# Patient Record
Sex: Female | Born: 1950
Health system: Southern US, Community
[De-identification: ages and names within clinical notes are randomized; demographics above are authoritative.]

## PROBLEM LIST (undated history)

## (undated) DIAGNOSIS — I82409 Acute embolism and thrombosis of unspecified deep veins of unspecified lower extremity: Secondary | ICD-10-CM

## (undated) DIAGNOSIS — A0811 Acute gastroenteropathy due to Norwalk agent: Secondary | ICD-10-CM

## (undated) DIAGNOSIS — G4730 Sleep apnea, unspecified: Secondary | ICD-10-CM

## (undated) DIAGNOSIS — R0609 Other forms of dyspnea: Secondary | ICD-10-CM

## (undated) DIAGNOSIS — F32A Depression, unspecified: Secondary | ICD-10-CM

## (undated) DIAGNOSIS — F329 Major depressive disorder, single episode, unspecified: Secondary | ICD-10-CM

## (undated) DIAGNOSIS — K5730 Diverticulosis of large intestine without perforation or abscess without bleeding: Secondary | ICD-10-CM

## (undated) DIAGNOSIS — R001 Bradycardia, unspecified: Secondary | ICD-10-CM

## (undated) DIAGNOSIS — R0600 Dyspnea, unspecified: Secondary | ICD-10-CM

## (undated) DIAGNOSIS — K222 Esophageal obstruction: Secondary | ICD-10-CM

## (undated) DIAGNOSIS — K589 Irritable bowel syndrome without diarrhea: Secondary | ICD-10-CM

## (undated) DIAGNOSIS — R079 Chest pain, unspecified: Secondary | ICD-10-CM

## (undated) DIAGNOSIS — I10 Essential (primary) hypertension: Secondary | ICD-10-CM

## (undated) DIAGNOSIS — E785 Hyperlipidemia, unspecified: Secondary | ICD-10-CM

## (undated) DIAGNOSIS — K5792 Diverticulitis of intestine, part unspecified, without perforation or abscess without bleeding: Secondary | ICD-10-CM

## (undated) HISTORY — DX: Hyperlipidemia, unspecified: E78.5

## (undated) HISTORY — DX: Acute embolism and thrombosis of unspecified deep veins of unspecified lower extremity: I82.409

## (undated) HISTORY — DX: Diverticulitis of intestine, part unspecified, without perforation or abscess without bleeding: K57.92

## (undated) HISTORY — PX: NISSEN FUNDOPLICATION: SHX2091

## (undated) HISTORY — DX: Major depressive disorder, single episode, unspecified: F32.9

## (undated) HISTORY — PX: VAGINAL HYSTERECTOMY: SUR661

## (undated) HISTORY — PX: APPENDECTOMY: SHX54

## (undated) HISTORY — DX: Sleep apnea, unspecified: G47.30

## (undated) HISTORY — DX: Diverticulosis of large intestine without perforation or abscess without bleeding: K57.30

## (undated) HISTORY — DX: Depression, unspecified: F32.A

## (undated) HISTORY — DX: Irritable bowel syndrome, unspecified: K58.9

## (undated) HISTORY — PX: CHOLECYSTECTOMY: SHX55

## (undated) HISTORY — PX: COLON SURGERY: SHX602

## (undated) HISTORY — DX: Esophageal obstruction: K22.2

---

## 1997-08-27 ENCOUNTER — Other Ambulatory Visit: Admission: RE | Admit: 1997-08-27 | Discharge: 1997-08-27 | Payer: Self-pay | Admitting: Obstetrics and Gynecology

## 1997-12-26 ENCOUNTER — Encounter: Payer: Self-pay | Admitting: Emergency Medicine

## 1997-12-26 ENCOUNTER — Emergency Department (HOSPITAL_COMMUNITY): Admission: EM | Admit: 1997-12-26 | Discharge: 1997-12-26 | Payer: Self-pay | Admitting: Emergency Medicine

## 1998-07-18 ENCOUNTER — Ambulatory Visit (HOSPITAL_COMMUNITY): Admission: RE | Admit: 1998-07-18 | Discharge: 1998-07-18 | Payer: Self-pay | Admitting: Gastroenterology

## 1998-07-18 ENCOUNTER — Encounter: Payer: Self-pay | Admitting: Gastroenterology

## 1998-11-28 ENCOUNTER — Inpatient Hospital Stay (HOSPITAL_COMMUNITY): Admission: EM | Admit: 1998-11-28 | Discharge: 1998-11-29 | Payer: Self-pay | Admitting: Emergency Medicine

## 1998-11-28 ENCOUNTER — Encounter: Payer: Self-pay | Admitting: Emergency Medicine

## 1998-12-31 ENCOUNTER — Ambulatory Visit: Admission: RE | Admit: 1998-12-31 | Discharge: 1998-12-31 | Payer: Self-pay | Admitting: Internal Medicine

## 1999-03-21 ENCOUNTER — Other Ambulatory Visit: Admission: RE | Admit: 1999-03-21 | Discharge: 1999-03-21 | Payer: Self-pay | Admitting: Obstetrics and Gynecology

## 1999-06-14 ENCOUNTER — Ambulatory Visit: Admission: RE | Admit: 1999-06-14 | Discharge: 1999-06-14 | Payer: Self-pay | Admitting: Pulmonary Disease

## 2000-01-09 ENCOUNTER — Encounter: Payer: Self-pay | Admitting: Obstetrics and Gynecology

## 2000-01-09 ENCOUNTER — Encounter: Admission: RE | Admit: 2000-01-09 | Discharge: 2000-01-09 | Payer: Self-pay | Admitting: Obstetrics and Gynecology

## 2000-06-30 ENCOUNTER — Encounter: Admission: RE | Admit: 2000-06-30 | Discharge: 2000-06-30 | Payer: Self-pay | Admitting: Otolaryngology

## 2000-06-30 ENCOUNTER — Encounter: Payer: Self-pay | Admitting: Otolaryngology

## 2000-08-10 ENCOUNTER — Encounter (INDEPENDENT_AMBULATORY_CARE_PROVIDER_SITE_OTHER): Payer: Self-pay | Admitting: *Deleted

## 2000-08-10 ENCOUNTER — Ambulatory Visit (HOSPITAL_COMMUNITY): Admission: RE | Admit: 2000-08-10 | Discharge: 2000-08-10 | Payer: Self-pay | Admitting: *Deleted

## 2000-08-31 ENCOUNTER — Encounter (INDEPENDENT_AMBULATORY_CARE_PROVIDER_SITE_OTHER): Payer: Self-pay | Admitting: *Deleted

## 2000-08-31 ENCOUNTER — Ambulatory Visit (HOSPITAL_COMMUNITY): Admission: RE | Admit: 2000-08-31 | Discharge: 2000-08-31 | Payer: Self-pay | Admitting: *Deleted

## 2000-11-12 ENCOUNTER — Encounter: Payer: Self-pay | Admitting: General Surgery

## 2000-11-15 ENCOUNTER — Observation Stay (HOSPITAL_COMMUNITY): Admission: RE | Admit: 2000-11-15 | Discharge: 2000-11-16 | Payer: Self-pay | Admitting: General Surgery

## 2000-11-15 ENCOUNTER — Encounter (INDEPENDENT_AMBULATORY_CARE_PROVIDER_SITE_OTHER): Payer: Self-pay | Admitting: *Deleted

## 2001-05-11 HISTORY — PX: GALLBLADDER SURGERY: SHX652

## 2001-06-24 ENCOUNTER — Other Ambulatory Visit: Admission: RE | Admit: 2001-06-24 | Discharge: 2001-06-24 | Payer: Self-pay | Admitting: *Deleted

## 2001-10-20 ENCOUNTER — Encounter: Admission: RE | Admit: 2001-10-20 | Discharge: 2001-10-20 | Payer: Self-pay

## 2001-11-14 ENCOUNTER — Encounter: Payer: Self-pay | Admitting: Surgery

## 2001-11-21 ENCOUNTER — Encounter (INDEPENDENT_AMBULATORY_CARE_PROVIDER_SITE_OTHER): Payer: Self-pay | Admitting: Specialist

## 2001-11-21 ENCOUNTER — Encounter: Payer: Self-pay | Admitting: Surgery

## 2001-11-21 ENCOUNTER — Observation Stay (HOSPITAL_COMMUNITY): Admission: RE | Admit: 2001-11-21 | Discharge: 2001-11-22 | Payer: Self-pay | Admitting: Surgery

## 2001-11-21 ENCOUNTER — Encounter (INDEPENDENT_AMBULATORY_CARE_PROVIDER_SITE_OTHER): Payer: Self-pay | Admitting: *Deleted

## 2002-05-11 HISTORY — PX: OTHER SURGICAL HISTORY: SHX169

## 2002-07-21 ENCOUNTER — Other Ambulatory Visit: Admission: RE | Admit: 2002-07-21 | Discharge: 2002-07-21 | Payer: Self-pay | Admitting: Obstetrics & Gynecology

## 2002-08-24 ENCOUNTER — Encounter: Payer: Self-pay | Admitting: Cardiology

## 2002-08-24 ENCOUNTER — Ambulatory Visit (HOSPITAL_COMMUNITY): Admission: RE | Admit: 2002-08-24 | Discharge: 2002-08-24 | Payer: Self-pay | Admitting: Cardiology

## 2002-09-08 ENCOUNTER — Encounter: Payer: Self-pay | Admitting: Emergency Medicine

## 2002-09-08 ENCOUNTER — Emergency Department (HOSPITAL_COMMUNITY): Admission: EM | Admit: 2002-09-08 | Discharge: 2002-09-08 | Payer: Self-pay | Admitting: Emergency Medicine

## 2002-09-09 ENCOUNTER — Inpatient Hospital Stay (HOSPITAL_COMMUNITY): Admission: EM | Admit: 2002-09-09 | Discharge: 2002-09-13 | Payer: Self-pay | Admitting: Emergency Medicine

## 2002-09-15 ENCOUNTER — Encounter: Admission: RE | Admit: 2002-09-15 | Discharge: 2002-09-15 | Payer: Self-pay | Admitting: Internal Medicine

## 2002-09-18 ENCOUNTER — Encounter: Admission: RE | Admit: 2002-09-18 | Discharge: 2002-09-18 | Payer: Self-pay | Admitting: Internal Medicine

## 2002-09-22 ENCOUNTER — Encounter: Admission: RE | Admit: 2002-09-22 | Discharge: 2002-09-22 | Payer: Self-pay | Admitting: Internal Medicine

## 2002-09-25 ENCOUNTER — Encounter: Admission: RE | Admit: 2002-09-25 | Discharge: 2002-09-25 | Payer: Self-pay | Admitting: Internal Medicine

## 2002-10-09 ENCOUNTER — Encounter: Admission: RE | Admit: 2002-10-09 | Discharge: 2002-10-09 | Payer: Self-pay | Admitting: Internal Medicine

## 2002-11-06 ENCOUNTER — Encounter: Admission: RE | Admit: 2002-11-06 | Discharge: 2002-11-06 | Payer: Self-pay | Admitting: Internal Medicine

## 2002-11-30 ENCOUNTER — Encounter: Admission: RE | Admit: 2002-11-30 | Discharge: 2002-11-30 | Payer: Self-pay | Admitting: Internal Medicine

## 2002-12-21 ENCOUNTER — Encounter: Admission: RE | Admit: 2002-12-21 | Discharge: 2002-12-21 | Payer: Self-pay | Admitting: Unknown Physician Specialty

## 2003-01-01 ENCOUNTER — Encounter: Admission: RE | Admit: 2003-01-01 | Discharge: 2003-01-01 | Payer: Self-pay | Admitting: Internal Medicine

## 2003-02-05 ENCOUNTER — Encounter: Admission: RE | Admit: 2003-02-05 | Discharge: 2003-02-05 | Payer: Self-pay | Admitting: Internal Medicine

## 2003-06-26 ENCOUNTER — Encounter: Admission: RE | Admit: 2003-06-26 | Discharge: 2003-06-26 | Payer: Self-pay | Admitting: Family Medicine

## 2003-10-03 ENCOUNTER — Encounter: Admission: RE | Admit: 2003-10-03 | Discharge: 2003-10-03 | Payer: Self-pay | Admitting: Family Medicine

## 2003-10-05 ENCOUNTER — Encounter: Admission: RE | Admit: 2003-10-05 | Discharge: 2003-10-05 | Payer: Self-pay | Admitting: Family Medicine

## 2003-11-14 ENCOUNTER — Encounter: Admission: RE | Admit: 2003-11-14 | Discharge: 2003-11-14 | Payer: Self-pay | Admitting: Neurosurgery

## 2003-11-28 ENCOUNTER — Encounter: Admission: RE | Admit: 2003-11-28 | Discharge: 2003-11-28 | Payer: Self-pay | Admitting: Neurosurgery

## 2003-12-13 ENCOUNTER — Encounter: Admission: RE | Admit: 2003-12-13 | Discharge: 2003-12-13 | Payer: Self-pay | Admitting: Neurosurgery

## 2004-01-24 ENCOUNTER — Encounter: Admission: RE | Admit: 2004-01-24 | Discharge: 2004-01-24 | Payer: Self-pay | Admitting: Cardiology

## 2004-02-15 ENCOUNTER — Encounter: Admission: RE | Admit: 2004-02-15 | Discharge: 2004-02-15 | Payer: Self-pay | Admitting: Gastroenterology

## 2004-02-29 ENCOUNTER — Encounter: Payer: Self-pay | Admitting: Gastroenterology

## 2004-02-29 ENCOUNTER — Ambulatory Visit (HOSPITAL_COMMUNITY): Admission: RE | Admit: 2004-02-29 | Discharge: 2004-02-29 | Payer: Self-pay | Admitting: Gastroenterology

## 2004-02-29 DIAGNOSIS — K5730 Diverticulosis of large intestine without perforation or abscess without bleeding: Secondary | ICD-10-CM | POA: Insufficient documentation

## 2004-02-29 DIAGNOSIS — K222 Esophageal obstruction: Secondary | ICD-10-CM | POA: Insufficient documentation

## 2004-06-06 ENCOUNTER — Encounter: Admission: RE | Admit: 2004-06-06 | Discharge: 2004-06-06 | Payer: Self-pay | Admitting: Family Medicine

## 2005-05-11 HISTORY — PX: PLANTAR FASCIA SURGERY: SHX746

## 2006-05-07 ENCOUNTER — Emergency Department: Payer: Self-pay | Admitting: Emergency Medicine

## 2007-01-24 ENCOUNTER — Emergency Department: Payer: Self-pay | Admitting: Emergency Medicine

## 2008-07-10 ENCOUNTER — Encounter: Admission: RE | Admit: 2008-07-10 | Discharge: 2008-07-10 | Payer: Self-pay | Admitting: Cardiology

## 2008-07-17 ENCOUNTER — Ambulatory Visit (HOSPITAL_COMMUNITY): Admission: RE | Admit: 2008-07-17 | Discharge: 2008-07-17 | Payer: Self-pay | Admitting: Cardiology

## 2008-07-17 ENCOUNTER — Encounter (INDEPENDENT_AMBULATORY_CARE_PROVIDER_SITE_OTHER): Payer: Self-pay | Admitting: Cardiology

## 2008-11-18 ENCOUNTER — Emergency Department: Payer: Self-pay | Admitting: Emergency Medicine

## 2009-04-27 ENCOUNTER — Emergency Department: Payer: Self-pay | Admitting: Internal Medicine

## 2009-06-12 ENCOUNTER — Encounter: Admission: RE | Admit: 2009-06-12 | Discharge: 2009-06-12 | Payer: Self-pay | Admitting: Family Medicine

## 2010-01-20 ENCOUNTER — Telehealth: Payer: Self-pay | Admitting: Gastroenterology

## 2010-01-21 ENCOUNTER — Encounter: Payer: Self-pay | Admitting: Gastroenterology

## 2010-01-21 DIAGNOSIS — I82409 Acute embolism and thrombosis of unspecified deep veins of unspecified lower extremity: Secondary | ICD-10-CM | POA: Insufficient documentation

## 2010-01-21 DIAGNOSIS — F329 Major depressive disorder, single episode, unspecified: Secondary | ICD-10-CM | POA: Insufficient documentation

## 2010-01-21 DIAGNOSIS — I10 Essential (primary) hypertension: Secondary | ICD-10-CM | POA: Insufficient documentation

## 2010-01-22 ENCOUNTER — Encounter: Payer: Self-pay | Admitting: Nurse Practitioner

## 2010-01-22 ENCOUNTER — Ambulatory Visit: Payer: Self-pay | Admitting: Gastroenterology

## 2010-01-22 DIAGNOSIS — K5900 Constipation, unspecified: Secondary | ICD-10-CM | POA: Insufficient documentation

## 2010-01-22 DIAGNOSIS — K589 Irritable bowel syndrome without diarrhea: Secondary | ICD-10-CM | POA: Insufficient documentation

## 2010-01-23 ENCOUNTER — Telehealth (INDEPENDENT_AMBULATORY_CARE_PROVIDER_SITE_OTHER): Payer: Self-pay | Admitting: *Deleted

## 2010-01-27 ENCOUNTER — Telehealth: Payer: Self-pay | Admitting: Gastroenterology

## 2010-01-31 ENCOUNTER — Telehealth: Payer: Self-pay | Admitting: Nurse Practitioner

## 2010-02-25 ENCOUNTER — Ambulatory Visit: Payer: Self-pay | Admitting: Gastroenterology

## 2010-02-25 DIAGNOSIS — K5732 Diverticulitis of large intestine without perforation or abscess without bleeding: Secondary | ICD-10-CM

## 2010-02-25 HISTORY — DX: Diverticulitis of large intestine without perforation or abscess without bleeding: K57.32

## 2010-06-01 ENCOUNTER — Encounter: Payer: Self-pay | Admitting: Physician Assistant

## 2010-06-01 ENCOUNTER — Encounter: Payer: Self-pay | Admitting: Family Medicine

## 2010-06-10 ENCOUNTER — Telehealth: Payer: Self-pay | Admitting: Gastroenterology

## 2010-06-10 NOTE — Assessment & Plan Note (Signed)
Summary: F/U APPT...LSW.   History of Present Illness Visit Type: Initial Visit Primary GI MD: Melvia Heaps MD [facility] Primary Provider: Lonie Peak, PA Requesting Provider: n/a Chief Complaint: Follow up appointment No GI complaints. Had 1 episode of fainting but ok now History of Present Illness:   Ms. Stiner has returned for followup of her abdominal pain.  She was seen on September 14 for left lower quadrant pain and was treated with antibiotics for presumed diverticulitis.  Her abdominal pain has subsequently subsided.  She has had at least one episode of syncope while moving her bowels associated with crampy abdominal pain.  There has been no bleeding.  She has a history of diverticulosis seen by colonoscopy in 2005.   GI Review of Systems      Denies abdominal pain, acid reflux, belching, bloating, chest pain, dysphagia with liquids, dysphagia with solids, heartburn, loss of appetite, nausea, vomiting, vomiting blood, weight loss, and  weight gain.        Denies anal fissure, black tarry stools, change in bowel habit, constipation, diarrhea, diverticulosis, fecal incontinence, heme positive stool, hemorrhoids, irritable bowel syndrome, jaundice, light color stool, liver problems, rectal bleeding, and  rectal pain.    Current Medications (verified): 1)  Metoprolol Tartrate 50 Mg Tabs (Metoprolol Tartrate) .... Take 1 Tablet By Mouth Two Times A Day 2)  Pravastatin Sodium 40 Mg Tabs (Pravastatin Sodium) .... Once Daily 3)  Hydrochlorothiazide 25 Mg Tabs (Hydrochlorothiazide) .... Take 1 Tablet By Mouth Once Daily 4)  Celexa 20 Mg Tabs (Citalopram Hydrobromide) .... Take 1 Tablet By Mouth Once Daily  Allergies (verified): 1)  ! Penicillin 2)  ! * Dicyclomine 3)  ! * Lisinopril  Past History:  Past Medical History: Last updated: 01/22/2010 Depression Diverticulosis DVT Esophageal Stricture Hyperlipidemia Hypertension Arthritis GERD Irritable Bowel Syndrome Sleep  Apnea  Past Surgical History: Last updated: 01/22/2010 Appendectomy Cholecystectomy Hysterectomy Nissan Fundoplication Planter fascitist surgery  Family History: Last updated: 01/22/2010 Family History of Colitis/Crohn's:Sister Family History of Colon Polyps:Sister Family History of Heart Disease: Mother Brain cancerFather Family History of Irritable Bowel Syndrome:Brother No FH of Colon Cancer:  Social History: Last updated: 01/22/2010 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  Alcohol Use - no Daily Caffeine Use Illicit Drug Use - no  Review of Systems       The patient complains of fainting.  The patient denies allergy/sinus, anemia, anxiety-new, arthritis/joint pain, back pain, blood in urine, breast changes/lumps, change in vision, confusion, cough, coughing up blood, depression-new, fatigue, fever, headaches-new, hearing problems, heart murmur, heart rhythm changes, itching, menstrual pain, muscle pains/cramps, night sweats, nosebleeds, pregnancy symptoms, shortness of breath, skin rash, sleeping problems, sore throat, swelling of feet/legs, swollen lymph glands, thirst - excessive , urination - excessive , urination changes/pain, urine leakage, vision changes, and voice change.         All other systems were reviewed and were negative   Vital Signs:  Patient profile:   60 year old female Height:      63 inches Weight:      169 pounds BMI:     30.05 BSA:     1.80 Pulse rate:   60 / minute Pulse rhythm:   regular BP sitting:   124 / 60  (left arm)  Vitals Entered By: Merri Ray CMA Duncan Dull) (February 25, 2010 8:42 AM)  Physical Exam  Additional Exam:  On physical exam she is a well-developed well-nourished female  Abdominal exam -  there are no masses, tenderness or  organomegaly   Impression & Recommendations:  Problem # 1:  DIVERTICULITIS, ACUTE (ICD-562.11) Assessment Improved Clinically resolved with antibiotic therapy  Patient  Instructions: 1)  Copy sent to : Lonie Peak, PA 2)  Call back as needed  3)  The medication list was reviewed and reconciled.  All changed / newly prescribed medications were explained.  A complete medication list was provided to the patient / caregiver.

## 2010-06-10 NOTE — Progress Notes (Signed)
Summary: Zofran prescription filled  Phone Note Outgoing Call   Call placed by: Joselyn Glassman,  January 23, 2010 1:13 PM Call placed to: Patient Summary of Call: I called the pt to ask if she would want to try the Phenergan.  She said the Ins co did auth her to get the zofran and she only had to pay $10.00.  I asked her to call me on Monday with an update. Initial call taken by: Joselyn Glassman,  January 23, 2010 1:24 PM

## 2010-06-10 NOTE — Procedures (Signed)
Summary: ENDOSCOPY   EGD  Procedure date:  08/10/2000  Findings:      Location: [facility]     Patient Name: [name], [name] MRN: [id number] Procedure Procedures: Panendoscopy (EGD) CPT: 43235.  Personnel: Endoscopist: Roosvelt Harps, MD.  Referred By: Dorna Leitz, MD.  Exam Location: Exam performed in Endoscopy Suite. Outpatient  Patient Consent: Procedure, Alternatives, Risks and Benefits discussed, consent obtained, from patient. Consent to be contacted was not given.  Indications Symptoms: Pulmonary symptoms, including:  Cough. Hoarseness. Reflux symptoms  History Patient Habits Patient does not smoke. Drinking Status: not currently drinking.  Pre-Exam Physical: Cardio-pulmonary exam, HEENT exam WNL. Abdominal exam abnormal. Extremity exam, Neurological exam, Mental status exam WNL. Abnormal PE findings include: obese, mild epig. pain.  Exam Exam Info: Maximum depth of insertion Duodenum, intended Duodenum. Patient position: on left side. Vocal cords visualized. Gastric retroflexion performed. Images taken. ASA Classification: II. Tolerance: excellent.  Sedation Meds: Sedation was managed by the Endoscopist. Cetacaine Spray 2 sprays Demerol 50 mg. Versed 5 mg.  Monitoring: BP and pulse monitoring done. Oximetry used. Supplemental O2 given  Fluoroscopy: Fluoroscopy was not used.  Findings HIATAL HERNIA: Diaphragm 38 cm from mouth. Z-line/GE Junction 34 cm from mouth. Regular, Sharply demarcated Z-line with no signs of esophagitis or Barrett's. Comments: Image # 4.  IMAGE TAKEN: in Cardia. Image #3 attached. Comments: Hiatal hernia.  IMAGE TAKEN: in Duodenal Bulb. Image #1 attached. Comments: Normal.  IMAGE TAKEN: in Duodenal 2nd Portion. Image #2 attached. Comments: Normal.   Assessment Abnormal examination, see findings above.  Diagnoses: 530.81: GERD.   Events  Unplanned Intervention: No unplanned interventions were required.  Unplanned  Events: There were no complications. Plans Medication(s): PPI: Aciphex 20 mg BID,  Promotility: Metoclopramide 10 ac,  Promotility: Metoclopramide 20 HS,   Patient Education: Patient given standard instructions for: Hiatal Hernia. Reflux.  Disposition: After procedure patient sent to recovery. After recovery patient sent home.  Scheduling: Follow-up prn.    CC: Sera L.Jacob,MD     Todd Rosenbower,MD   This report was created from the original endoscopy report, which was reviewed and signed by the above listed endoscopist.

## 2010-06-10 NOTE — Procedures (Signed)
Summary: ENDOSCOPY   EGD  Procedure date:  02/29/2004  Findings:      Location: [facility]     Patient Name: [name], [name] MRN: [id number] Procedure Procedures: Panendoscopy (EGD) CPT: 43235.    with esophageal dilation. CPT: G9296129.  Personnel: Endoscopist: Barbette Hair. Arlyce Dice, MD.  Indications Symptoms: Dysphagia.  History  Current Medications: Patient is not currently taking Coumadin.  Pre-Exam Physical: Performed Feb 29, 2004  Entire physical exam was normal.  Exam Exam Info: Maximum depth of insertion Duodenum, intended Duodenum. Vocal cords visualized. Gastric retroflexion performed. ASA Classification: I. Tolerance: good.  Sedation Meds: Robinul 0.2 given IV. Fentanyl 70 given IV. Versed 6 mg. given IV. Cetacaine Spray 2 sprays given aerosolized.  Monitoring: BP and pulse monitoring done. Oximetry used. Supplemental O2 given at 2 Liters.  Findings STRICTURE / STENOSIS: Stricture in Distal Esophagus.  40 cm from mouth. ICD9: Esophageal Stricture: 530.3.  - Dilation: Distal Esophagus. Procedure was performed under Fluoroscopy. Savary dilator used, Diameter: 16-17-18 mm, Minimal Resistance, Minimal Heme present on extraction. Outcome: successful. Comments: Minimal heme on last dilator.   Assessment Abnormal examination, see findings above.  Diagnoses: 530.3: Esophageal Stricture.   Events  Unplanned Intervention: No unplanned interventions were required.  Unplanned Events: There were no complications. Plans Patient Education: Patient given standard instructions for: Stenosis / Stricture.  Scheduling: Office Visit, to [employer]. Arlyce Dice, MD, around Mar 21, 2004.    CC: Delma Post W.Tilley,JR.MD  This report was created from the original endoscopy report, which was reviewed and signed by the above listed endoscopist.

## 2010-06-10 NOTE — Letter (Signed)
Summary: New Patient letter  [facility]  [address] [place], [place] [address]   Phone: [phone]  Fax: [phone]       01/21/2010 MRN: [id number]  [name] [address] [place], [place]  [address]  Dear Ms. Neidert,  Welcome to the Gastroenterology Division at [facility].    You are scheduled to see Dr.  Melvia Heaps on Mar 03, 2010 at 3pm on the 3rd floor at [facility], [address]  We ask that you try to arrive at our office 15 minutes prior to your appointment time to allow for check-in.  We would like you to complete the enclosed self-administered evaluation form prior to your visit and bring it with you on the day of your appointment.  We will review it with you.  Also, please bring a complete list of all your medications or, if you prefer, bring the medication bottles and we will list them.  Please bring your insurance card so that we may make a copy of it.  If your insurance requires a referral to see a specialist, please bring your referral form from your primary care physician.  Co-payments are due at the time of your visit and may be paid by cash, check or credit card.     Your office visit will consist of a consult with your physician (includes a physical exam), any laboratory testing he/she may order, scheduling of any necessary diagnostic testing (e.g. x-ray, ultrasound, CT-scan), and scheduling of a procedure (e.g. Endoscopy, Colonoscopy) if required.  Please allow enough time on your schedule to allow for any/all of these possibilities.    If you cannot keep your appointment, please call [phone] to cancel or reschedule prior to your appointment date.  This allows [place] the opportunity to schedule an appointment for another patient in need of care.  If you do not cancel or reschedule by 5 p.m. the business day prior to your appointment date, you will be charged a $50.00 late cancellation/no-show fee.    Thank you for choosing  [facility] for your medical needs.  We appreciate the opportunity to care for you.  Please visit [place] at our website  to learn more about our practice.                     Sincerely,                                                             The Gastroenterology Division

## 2010-06-10 NOTE — Progress Notes (Signed)
Summary: Triage  Phone Note Call from Patient Call back at Home Phone [phone]   Caller: Patient Call For: [name] Reason for Call: Talk to Nurse Summary of Call: Was at work and had an "accident"...went to restroom and nothing but blood Initial call taken by: Karna Christmas,  January 31, 2010 9:49 AM  Follow-up for Phone Call        The pt said that she felt  like she urinated urgently.  She saw a slight amount of pink tinge when she wiped with toilet tissue.  She had a soft but what she considered a normal BM yesterday and today with no blood.  Pt said he pain is minimul that she had when she was here and she is finishing up her Cipro and Flagyl.   I urged her to keep her appt with Dr Arlyce Dice to follow up.   I told her I would speak to Gunnar Fusi about a possible UTI and we may have her go to lab for urine test or Gunnar Fusi may suggest she call her PCP. Follow-up by: Joselyn Glassman,  January 31, 2010 10:51 AM

## 2010-06-10 NOTE — Op Note (Signed)
Summary: SURGERY REPORT                        [facility]  Patient:    [name], [name] Visit Number: [id number] MRN: [id number]          Service Type: SUR Location: [id number] Attending Physician:  Andre Lefort Dictated by:   Sandria Bales. Ezzard Standing, M.D. Proc. Date: 11/21/01 Admit Date:  11/21/2001 Discharge Date: 11/22/2001   CC:         Lonie Peak, M.D., Forrest Moron, M.D. [facility]   Operative Report  DATE OF BIRTH:  29-Oct-1950  PREOPERATIVE DIAGNOSIS:  Chronic cholecystitis, cholelithiasis.  POSTOPERATIVE DIAGNOSIS:  Chronic cholecystitis, cholelithiasis.  OPERATION:  Laparoscopic cholecystectomy with intraoperative cholangiogram.  SURGEON:  Sandria Bales. Ezzard Standing, M.D.  ASSISTANT:  Thornton Park. Daphine Deutscher, M.D.  ANESTHESIA:  General endotracheal anesthesia.  INDICATIONS:  Ms. Shartzer is a 60 year old white female who is a patient of Dr. Lonie Peak of [place], who has had some vague GI symptoms and documented gallstones.  She has had a prior laparoscopic Nissen fundoplication by Dr. Abbey Chatters.  She also had a total hysterectomy and appendectomy.  She uses a BiPAP machine episodically for sleep apnea and is followed by Dr. Marcelyn Bruins.  She now comes for attempted laparoscopic cholecystectomy. DESCRIPTION OF PROCEDURE:  The patient was placed in supine position, given a general endotracheal anesthesia.  Her abdomen was prepped with Betadine solution and sterilely draped.  She was given 1 g of Ancef at the initiation of the procedure.  I went through her old scars in her umbilicus, subxiphoid, and two subcostal incisions and put the 10 mm Hasson trocar, secured this with a 0 Vicryl suture.  Through the umbilicus, I put a 10 mm scope, insufflated the abdomen, explored the abdomen.  Right and left lobes of the liver were unremarkable.  The stomach wall was unremarkable.  The lower abdomen was obscured from omentum which was attached to the anterior  abdominal wall from a lower abdominal incision from hysterectomy.  Again, the three trocars were placed, subxiphoid trocar, mid subcostal, and  5 mm lateral subcostal trocars.  The gallbladder was grasped, rotated cephalad.  It did have some fat around the gallbladder.  This was dissected off.  I got down to the gallbladder-cystic duct junction.  I placed two clips on the gallbladder side.  I had to divide the cystic artery before getting down to this location.  I then shot an intraoperative cholangiogram.  The intraoperative cholangiogram was shot using a cut off taut catheter inserted through a 14-gauge Jelco.  This taut catheter was secured with an Endoclip and then injected with about 6 cc of half-strength Hypaque solution. Under direct fluoroscopy, this showed free fluoroscopic contrast down the cystic duct into the common bile duct into the duodenum an The common bile  d back up to hepatic radicles.  There was no filling defect, no mass, and this was felt to be a normal intraoperative cholangiogram.  The taut catheter was then removed.  The three clips were placed across the cystic duct, and the cystic duct was then divided.   The gallbladder was divided from the liver bed using primarily hook Bovie electrocautery and dividing the gallbladder away up until I got to the top of the gallbladder at which time if reinspected the triangle of Calot and reinspected the bed of the gallbladder.  There was no bleeding or bile  leak on any of these inspections. The gallbladder was then divided, placed in Endocatch bag, and then delivered through the umbilicus.  The umbilical port was closed with a 0 Vicryl suture which was a pursestring which was there.  The other trocar sites were left alone.  The skin at each trocar site was closed with a 5-0 Monocryl and painted with tincture of benzoin and Steri-Strips and sterilely dressed.  The patient tolerated the procedure well and was transported  to the recovery room in good condition.  Sponge and needle counts were correct at the end of the case. Dictated by:   Sandria Bales. Ezzard Standing, M.D. Attending Physician:  Andre Lefort DD:  11/21/01 TD:  11/23/01 Job: [id number]

## 2010-06-10 NOTE — Procedures (Signed)
Summary: COLONOSCOPY   Colonoscopy  Procedure date:  02/29/2004  Findings:      Location:  [facility].     Patient Name: [name], [name] MRN: [id number] Procedure Procedures: Colonoscopy CPT: 54098.  Personnel: Endoscopist: Barbette Hair. Arlyce Dice, MD.  Indications Symptoms: Diarrhea  History  Current Medications: Patient is not currently taking Coumadin.  Pre-Exam Physical: Performed Feb 18, 2004. Entire physical exam was normal.  Exam Exam: Extent of exam reached: Cecum, extent intended: Cecum.  The cecum was identified by IC valve. Colon retroflexion performed. ASA Classification: II. Tolerance: good.  Monitoring: Pulse and BP monitoring, Oximetry used. Supplemental O2 given. at 2 Liters.  Colon Prep Used Miralax for colon prep. Prep results: good.  Sedation Meds: Fentanyl 100 mcg. given IV. Versed 10 mg. given IV.  Findings NORMAL EXAM: Cecum.  - DIVERTICULOSIS: Sigmoid Colon. ICD9: Diverticulosis, Colon: 562.10. Comments: Scattered tics with lumenal narrowing, muscular hypertrophy and spasm.  NORMAL EXAM: Rectum.   Assessment Abnormal examination, see findings above.  Diagnoses: 562.10: Diverticulosis, Colon.   Events  Unplanned Interventions: No intervention was required.  Unplanned Events: There were no complications. Plans Medication Plan: Fiber supplements: Bran 1 Tbsp QD, starting Feb 18, 2004  Anti-diarrheal: Cholestyramine 1pkt BID, starting Feb 18, 2004   Patient Education: Patient given standard instructions for: Diverticulosis.  Scheduling/Referral: Office Visit, to [employer]. Arlyce Dice, MD, around Mar 10, 2004.    CC: Lorn Junes PA-C     Spencer W.Tilley,JR.MD  This report was created from the original endoscopy report, which was reviewed and signed by the above listed endoscopist.

## 2010-06-10 NOTE — Progress Notes (Signed)
Summary: TRIAGE-Pain/fever  Phone Note Call from Patient Call back at Home Phone [phone]   Call For: Dr Arlyce Dice Reason for Call: Talk to Doctor Summary of Call: Abd Pain and running a low grade fever x2days. Feels that next available appt on 10-24 at 3pm is too far away. Last rov w/Dr Arlyce Dice 02-14-04. Had Colon 02-18-04 & Endo 02-29-04. Has not been here since. Initial call taken by: Leanor Kail [facility],  January 20, 2010 9:47 AM  Follow-up for Phone Call        Pt. c/o 2 weeks of lower abd. pain, right side a bit more, pain is intermittent, more severe at times. Had a low grade fever X2 last week. Has diarrhea, burt had an explosive episode yesterday. Intermittent nausea for 3 weeks.Denies blood, black stools.  1) See [name] NP on 01-22-10 at 8:30am 2) Soft,bland diet. No spicy,greasy,fried foods.  3) tylenol/Ibuprofen as needed 4) Heating pad to abdomen as needed. 5) Immodium as needed for diarrhea. 6) If symptoms become worse call back immediately or go to ER.  Follow-up by: Laureen Ochs LPN,  January 20, 2010 10:28 AM  Additional Follow-up for Phone Call Additional follow up Details #1::        Please work her in with [place] today Additional Follow-up by: Louis Meckel MD,  January 20, 2010 10:54 AM    Additional Follow-up for Phone Call Additional follow up Details #2::    Message left for patient to callback. Laureen Ochs LPN  January 20, 2010 11:03 AM   Pt. cannot come today, she prefers to wait till Wednesday. Pt. instructed to call back as needed.  Follow-up by: Laureen Ochs LPN,  January 20, 2010 2:28 PM

## 2010-06-10 NOTE — Assessment & Plan Note (Signed)
Summary: ABD.PAIN, LOW GRADE TEMP., DIARRHEA   (DR.KAPLAN PT.)   [name]   History of Present Illness Visit Type: Initial Visit Primary GI MD: Melvia Heaps MD [facility] Primary Provider: Lonie Peak, PA Chief Complaint: Severe abdominal pain lower quad. diarrhea/constipation, low grad fever History of Present Illness:   Patient last seen in 2005 at time of EGD and colonoscopy. She has a history of IBS, diverticulosis and esophageal. strictures. Status post multiple addominal surgeries. Patient presents with a one week history of lower abdominal pain, nausea and low grade fever.  Laying down helps pain. She has IBS but this pain is different. She usually has loose stool with her IBS but hasn't had a BM in two days now . No rectal bleeding.  No dysuria.      GI Review of Systems    Reports abdominal pain, acid reflux, bloating, and  nausea.     Location of  Abdominal pain: lower abdomen.    Denies belching, chest pain, dysphagia with liquids, dysphagia with solids, heartburn, loss of appetite, vomiting, vomiting blood, weight loss, and  weight gain.      Reports change in bowel habits, constipation, diarrhea, diverticulosis, hemorrhoids, and  irritable bowel syndrome.     Denies anal fissure, black tarry stools, fecal incontinence, heme positive stool, jaundice, light color stool, liver problems, rectal bleeding, and  rectal pain. Preventive Screening-Counseling & Management  Alcohol-Tobacco     Smoking Status: never      Drug Use:  no.      Current Medications (verified): 1)  Metoprolol Tartrate 50 Mg Tabs (Metoprolol Tartrate) .... Take 1 Tablet By Mouth Two Times A Day 2)  Pravastatin Sodium 40 Mg Tabs (Pravastatin Sodium) .... Once Daily 3)  Hydrochlorothiazide 25 Mg Tabs (Hydrochlorothiazide) .... Take 1 Tablet By Mouth Once Daily 4)  Celexa 20 Mg Tabs (Citalopram Hydrobromide) .... Take 1 Tablet By Mouth Once Daily  Allergies (verified): 1)  ! Penicillin 2)  ! * Dicyclomine 3)   ! 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  Alcohol Use - no Daily Caffeine Use Illicit Drug Use - no Smoking Status:  never Drug Use:  no  Review of Systems       The patient complains of anxiety-new, arthritis/joint pain, back pain, depression-new, fatigue, fever, sleeping problems, sore throat, swelling of feet/legs, thirst - excessive, and urine leakage.  The patient denies allergy/sinus, anemia, blood in urine, breast changes/lumps, change in vision, confusion, cough, coughing up blood, fainting, headaches-new, hearing problems, heart murmur, heart rhythm changes, itching, menstrual pain, muscle pains/cramps, night sweats, nosebleeds, pregnancy symptoms, shortness of breath, skin rash, swollen lymph glands, thirst - excessive , urination - excessive , urination changes/pain, vision changes, and voice change.    Vital Signs:  Patient profile:   60 year old female Height:      63 inches Weight:      166.38 pounds BMI:     29.58 Temp:     98.6 degrees F oral Pulse rate:   76 / minute Pulse rhythm:   regular BP sitting:   106 / 68  (  left arm) Cuff size:   regular  Vitals Entered By: June McMurray CMA Duncan Dull) (January 22, 2010 8:18 AM)  Physical Exam  General:  Well developed, well nourished, no acute distress. Head:  Normocephalic and atraumatic. Eyes:  Conjunctiva pink, no icterus.  Neck:  no obvious  masses  Lungs:  Clear throughout to auscultation. Heart:  Regular rate and rhythm; no murmurs, rubs,  or bruits. Abdomen:  Soft, obese, significant tenderness in LLQ with some voluntary guarding but no peritoneal signs.  Msk:  Symmetrical with no gross deformities. Normal posture. Extremities:  No palmar erythema, no edema.  Neurologic:  Alert and  oriented x4;  grossly normal neurologically. Skin:  Intact without significant lesions or rashes. Cervical Nodes:  No significant cervical adenopathy. Psych:  Alert and cooperative. Normal mood and affect.   Impression & Recommendations:  Problem # 1:  ABDOMINAL PAIN-LLQ (ICD-789.04) Assessment New LLQ pain, low grade fevers, history of diverticulosis. LLQ quite tender on exam. Suspect diverticulitis (first episode). Patient had colonoscopy for diarrhea in 2006 (exam to cecum with good prep). Start Flagyl and Cipro. See further instructions below. Follow up appt. in 2-3 weeks.   Patient Instructions: 1)  We sent prescriptions for Cipro, Flagyl, and Generic Zofran to your pharmacy, [facility]. 2)  We faxed a prescription for Hydrocodone 500/325 MG also. 3)  Stay on a clear liquid diet for 3-4 days then slowly advance to a low residue diet as tolerated. 4)  Clear liquid and low residue diet brochures given. 5)  Make appointment at front desk for appointment to follow up with Dr Arlyce Dice in 3-4 weeks. 6)  When taking Hydrocodone and Generic Zofran, do not operatae machinery. 7)  Copy sent to : Lonie Peak, MD 8)  The medication list was reviewed and reconciled.  All changed / newly prescribed medications were explained.  A complete medication list was provided to the patient / caregiver. Prescriptions: ZOFRAN 4 MG TABS (ONDANSETRON HCL) Take 1 tab every 4-6 hours as needed for nausea  #40 x 0   Entered by:   Lowry Ram NCMA   Authorized by:   [name] NP   Signed by:   Lowry Ram NCMA on 01/22/2010   Method used:    Electronically to        [facility] [address] (retail)       [address], [address]       [place], [place]  [address]       Ph: [phone]       Fax: [phone]   RxID:   [id number] FLAGYL 500 MG TABS (METRONIDAZOLE) Take 1 tab 3 times daily x 10 days  #30 x 0   Entered by:   Lowry Ram NCMA   Authorized by:   [name] NP   Signed by:   Lowry Ram NCMA on 01/22/2010   Method used:   Electronically to        [facility] [address] (retail)       [address], [address]       [place], [place]  [address]       Ph: [phone]       Fax: [phone]   RxID:   [id number] CIPRO 500 MG TABS (CIPROFLOXACIN HCL) Take 1 tab twice daily x 10 days  #20 x 0   Entered by:   Lowry Ram NCMA  Authorized by:   [name] NP   Signed by:   Lowry Ram NCMA on 01/22/2010   Method used:   Electronically to        [facility] [address] (retail)       [address], [address]       [place], [place]  [address]       Ph: [phone]       Fax: [phone]   RxID:   [id number] HYDROCODONE-ACETAMINOPHEN 5-325 MG TABS (HYDROCODONE-ACETAMINOPHEN) Take 1 tab every 4- 6 hours as needed for pain  #30 x 0   Entered by:   Lowry Ram NCMA   Authorized by:   [name] NP   Signed by:   Lowry Ram NCMA on 01/22/2010   Method used:   Printed then faxed to ...       [facility] [address] (retail)       [address], [address]       [place], [place]  [address]       Ph: [phone]       Fax: [phone]   RxID:   [id number]

## 2010-06-10 NOTE — Op Note (Signed)
Summary: SURGERY REPORT                         [facility]  Patient:    [name], [name]                       MRN: [id number] Proc. Date: 11/15/00 Adm. Date:  [id number] Attending:  Arlis Porta CC:         Lucky Cowboy, M.D.   Operative Report  PREOPERATIVE DIAGNOSES:  Hiatal hernia with gastroesophageal reflux disease.  POSTOPERATIVE DIAGNOSES:  Hiatal hernia with gastroesophageal reflux disease.  PROCEDURE:  Laparoscopic hiatal hernia repair and Nissen fundoplications.  SURGEON:  Adolph Pollack, M.D.  ASSISTANT:  Ollen Gross. Vernell Morgans, M.D.  ANESTHESIA:  General.  INDICATIONS:  This 60 year old female has had gastroesophageal reflux for a long time and is having some supraesophageal manifestations from it as well as persistent reflux despite medical therapy.  She now presents for operative management.  TECHNIQUE:  She was placed supine on the operating table and a general anesthetic was administered.  The abdomen was sterilely prepped and draped. Marcaine 0.5% was infiltrated just superior to the umbilicus, and a supraumbilical midline incision was made, incising the skin and subcutaneous tissue sharply.  A 1 cm incision was made in the fascia in the midline, and the peritoneal cavity was entered bluntly and under direct vision.  A pursestring suture of 0 Vicryl was placed around the fascial edges.  A Hasson trocar was introduced to the peritoneal cavity and pneumoperitoneum created by insufflation of CO2 gas.  Next the laparoscope was introduced.  Under direct vision, a 5 mm trocar was placed in the right lateral abdomen, and the small flexible liver retractor was introduced and used to retract the left lobe of the liver anteriorly, exposing the gastroesophageal junction.  A 5 mm trocar was then placed in the left upper quadrant, 5 mm trocar placed in the right upper quadrant, and an 11 mm trocar placed just to the right of the midline. The area of the  gastrohepatic omentum was identified and incised up to the right crus.  The pharyngoesophageal ligament was then incised over to the left crus.  I then bluntly created a plane between the right crus and the esophagus and started to create a retroesophageal window until I identified the left crus.  Next I approached the fundus of the stomach and grasped the fundus approximately midway down.  I then divided short gastric vessels all the way up around the cardia, completely mobilizing the fundus of the stomach.  I divided filmy attachments between the cardia, stomach, and the left crus.  Next I reapproached the retroesophageal window and was able to create that with adequate length.  I noticed a moderate size hiatal hernia.  All contents had been reduced.  A Penrose drain was placed at the GE junction, and the GE junction was retracted anteriorly.  I then repaired the hiatal hernia with two interrupted 0 nonabsorbable sutures.  Next I was able to pass the fundus through the retroesophageal window, bringing it to the right side esophagus. I then approximated part of the fundus, thus simulated the wrap.  A size 50 Bougie was passed without difficulty into the stomach.  A 360 degree fundoplication was then performed using size 0 nonabsorbable suture.  The first two sutures included the left leaf of the wrap, a small piece of esophagus and the right leaf  of the wrap.  The last suture included the left and right leafs of the wrap.  The dilator was then removed, and the wrap was noted to be floppy and under no tension.  It measured approximately 2 cm in length.  The posterior aspect of the right leaf was then anchored to the right crus with a 0 nonabsorbable suture.  The area was inspected, and hemostasis was adequate.  The sutures of the wrap lie at approximately 10 to the 11 oclock position on the esophagus.  I then removed all trocars under direct vision and released the pneumoperitoneum. The  supraumbilical fascial defect was closed by tightening up and tying down the pursestring suture.  The skin incisions were then closed with 4-0 Monocryl subcuticular stitches followed by Steri-Strips and sterile dressings.  She tolerated the procedure well without any apparent complications and was taken to the recovery room in satisfactory condition. DD:  11/15/00 TD:  11/15/00 Job: [id number]

## 2010-06-10 NOTE — Progress Notes (Signed)
Summary: Condition Update  Phone Note Call from Patient Call back at Home Phone [phone]   Caller: Patient Call For: Dr. Arlyce Dice Reason for Call: Talk to Nurse Summary of Call: Condition Update- Initial call taken by: Vallarie Mare,  January 27, 2010 9:15 AM  Follow-up for Phone Call        Last seen 01-22-10, was given Cipro, Flagyl, Zofran And Hydrocodone. She is improving, overall  the pain is better. She will continue above meds as directed, keep f/u appt. with Dr.Kaplan on 02-25-10 and callback as needed.  Pt. wants [place] to know she had a BM on Saturday, she felt so sick, she states she passed out.  Pt. states 1 year ago and X2 6 monthes ago she passed out, both times when she was having a BM.  Follow-up by: Laureen Ochs LPN,  January 27, 2010 9:28 AM  Additional Follow-up for Phone Call Additional follow up Details #1::        continue antibiotics f/u OV Additional Follow-up by: Louis Meckel MD,  January 27, 2010 9:52 AM

## 2010-06-10 NOTE — Procedures (Signed)
Summary: ESOPHAGEAL MANOMETRY                    [facility]  Patient:    [name], [name]                       MRN: [id number] Proc. Date: 08/31/00 Adm. Date:  [id number] Disc. Date: [id number] Attending:  Mingo Amber CC:         Lucky Cowboy, M.D.  Adolph Pollack, M.D.   Procedure Report  PROCEDURE PERFORMED:  Esophageal manometry.  ENDOSCOPIST:  Roosvelt Harps, M.D.  INDICATIONS FOR PROCEDURE:  The patient is a 60 year old female with significant reflux uncontrolled with proton pump inhibitors.  She recently had an upper endoscopy that did not demonstrate Barretts esophagitis and showed a small 4 to 5 cm hiatal hernia.  She has been somewhat noncompliant with medical therapy recommendations.  The lower esophageal sphincter was located manometrically at 38.5 cm from the nares.  The pressure was 29.9 mmHg which was normal.  There was 100% relaxation with swallows.  Peristalsis of the body of the esophagus was present and there was normal relaxation of the lower esophageal sphincter at the onset of the peristaltic wave.  Amplitude in the mid and distal esophagus was 38 mmHg and 75 mmHg respectively of the peristaltic wave which is good.  There were 40% nontransmitted contractions, however.  IMPRESSION:  Near normal peristalsis of the esophagus which should qualify her for fundoplication. DD:  09/06/00 TD:  09/06/00 Job: [id number]

## 2010-06-10 NOTE — Letter (Signed)
Summary: Out of Work  [facility]  [address] [place], [place] [address]   Phone: [phone]  Fax: [phone]    January 22, 2010   Employee:  [name]    To Whom It May Concern:   For Medical reasons, please excuse the above named employee from work for the following dates:  Start:   01-22-10  End:   Including 01-23-10  If you need additional information, please feel free to contact our office.         Sincerely,     [name] RNP

## 2010-06-11 ENCOUNTER — Encounter (INDEPENDENT_AMBULATORY_CARE_PROVIDER_SITE_OTHER): Payer: Self-pay | Admitting: *Deleted

## 2010-06-12 ENCOUNTER — Encounter: Payer: Self-pay | Admitting: Gastroenterology

## 2010-06-17 ENCOUNTER — Other Ambulatory Visit (AMBULATORY_SURGERY_CENTER): Payer: 59 | Admitting: Gastroenterology

## 2010-06-17 ENCOUNTER — Encounter: Payer: Self-pay | Admitting: Gastroenterology

## 2010-06-17 ENCOUNTER — Telehealth: Payer: Self-pay | Admitting: Gastroenterology

## 2010-06-17 ENCOUNTER — Ambulatory Visit (HOSPITAL_COMMUNITY)
Admission: RE | Admit: 2010-06-17 | Discharge: 2010-06-17 | Disposition: A | Discharge status: Home / Self Care | Payer: 59 | Source: Ambulatory Visit | Attending: Gastroenterology | Admitting: Gastroenterology

## 2010-06-17 ENCOUNTER — Other Ambulatory Visit: Payer: Self-pay | Admitting: Gastroenterology

## 2010-06-17 DIAGNOSIS — K5750 Diverticulosis of both small and large intestine without perforation or abscess without bleeding: Secondary | ICD-10-CM

## 2010-06-17 DIAGNOSIS — K5732 Diverticulitis of large intestine without perforation or abscess without bleeding: Secondary | ICD-10-CM | POA: Insufficient documentation

## 2010-06-17 DIAGNOSIS — R1032 Left lower quadrant pain: Secondary | ICD-10-CM

## 2010-06-17 DIAGNOSIS — K56609 Unspecified intestinal obstruction, unspecified as to partial versus complete obstruction: Secondary | ICD-10-CM | POA: Insufficient documentation

## 2010-06-17 DIAGNOSIS — K5730 Diverticulosis of large intestine without perforation or abscess without bleeding: Secondary | ICD-10-CM

## 2010-06-18 NOTE — Miscellaneous (Signed)
Summary: [facility] Previsit/prep  Clinical Lists Changes  Medications: Added new medication of MOVIPREP 100 GM  SOLR (PEG-KCL-NACL-NASULF-NA ASC-C) As per prep instructions. - Signed Rx of MOVIPREP 100 GM  SOLR (PEG-KCL-NACL-NASULF-NA ASC-C) As per prep instructions.;  #1 x 0;  Signed;  Entered by: [name] RN;  Authorized by: Louis Meckel MD;  Method used: Electronically to [facility] [address], [place], [place], [place]  [address], Ph: [phone], Fax: [phone] Observations: Added new observation of ALLERGY REV: Done (06/12/2010 8:13)    Prescriptions: MOVIPREP 100 GM  SOLR (PEG-KCL-NACL-NASULF-NA ASC-C) As per prep instructions.  #1 x 0   Entered by:   [name] RN   Authorized by:   Louis Meckel MD   Signed by:   [name] RN on 06/12/2010   Method used:   Electronically to        [facility] [address] (retail)       [address]       [place], [place]  [address]       Ph: [phone]       Fax: [phone]   RxID:   [id number]

## 2010-06-18 NOTE — Letter (Signed)
Summary: [facility] Instructions  [facility]  [address] [place], [place] [address]   Phone: [phone]  Fax: [phone]       [name]    Sep 20, 1950    MRN: [id number]        Procedure Day [name]:  [name]  06/17/10     Arrival Time:  10:00AM     Procedure Time:  11:00AM     Location of Procedure:                    _ X_  [facility] (4th Floor)                      PREPARATION FOR COLONOSCOPY WITH MOVIPREP   Starting 5 days prior to your procedure 06/12/10 do not eat nuts, seeds, popcorn, corn, beans, peas,  salads, or any raw vegetables.  Do not take any fiber supplements (e.g. Metamucil, Citrucel, and Benefiber).  THE DAY BEFORE YOUR PROCEDURE         DATE: 06/16/10   DAY: MONDAY  1.  Drink clear liquids the entire day-NO SOLID FOOD  2.  Do not drink anything colored red or purple.  Avoid juices with pulp.  No orange juice.  3.  Drink at least 64 oz. (8 glasses) of fluid/clear liquids during the day to prevent dehydration and help the prep work efficiently.  CLEAR LIQUIDS INCLUDE: Water Jello Ice Popsicles Tea (sugar ok, no milk/cream) Powdered fruit flavored drinks Coffee (sugar ok, no milk/cream) Gatorade Juice: apple, white grape, white cranberry  Lemonade Clear bullion, consomm, broth Carbonated beverages (any kind) Strained chicken noodle soup Hard Candy                             4.  In the morning, mix first dose of MoviPrep solution:    Empty 1 Pouch A and 1 Pouch B into the disposable container    Add lukewarm drinking water to the top line of the container. Mix to dissolve    Refrigerate (mixed solution should be used within 24 hrs)  5.  Begin drinking the prep at 5:00 p.m. The MoviPrep container is divided by 4 marks.   Every 15 minutes drink the solution down to the next mark (approximately 8 oz) until the full liter is complete.   6.  Follow completed prep with 16 oz of clear liquid of your choice (Nothing red  or purple).  Continue to drink clear liquids until bedtime.  7.  Before going to bed, mix second dose of MoviPrep solution:    Empty 1 Pouch A and 1 Pouch B into the disposable container    Add lukewarm drinking water to the top line of the container. Mix to dissolve    Refrigerate  THE DAY OF YOUR PROCEDURE      DATE: 06/17/10   DAY: TUESDAY  Beginning at 6:00AM (5 hours before procedure):         1. Every 15 minutes, drink the solution down to the next mark (approx 8 oz) until the full liter is complete.  2. Follow completed prep with 16 oz. of clear liquid of your choice.    3. You may drink clear liquids until 9:00AM (2 HOURS BEFORE PROCEDURE).   MEDICATION INSTRUCTIONS  Unless otherwise instructed, you should take regular prescription medications with a small sip of water   as early as possible the morning  of your procedure.   Additional medication instructions: Hold HCTZ the morning of procedure.         OTHER INSTRUCTIONS  You will need a responsible adult at least 60 years of age to accompany you and drive you home.   This person must remain in the waiting room during your procedure.  Wear loose fitting clothing that is easily removed.  Leave jewelry and other valuables at home.  However, you may wish to bring a book to read or  an iPod/MP3 player to listen to music as you wait for your procedure to start.  Remove all body piercing jewelry and leave at home.  Total time from sign-in until discharge is approximately 2-3 hours.  You should go home directly after your procedure and rest.  You can resume normal activities the  day after your procedure.  The day of your procedure you should not:   Drive   Make legal decisions   Operate machinery   Drink alcohol   Return to work  You will receive specific instructions about eating, activities and medications before you leave.    The above instructions have been reviewed and explained to me by   [name] RN  June 12, 2010 8:44 AM     I fully understand and can verbalize these instructions _____________________________ Date _________

## 2010-06-18 NOTE — Progress Notes (Signed)
Summary: Triage  Phone Note Call from Patient Call back at [phone]   Caller: Patient Call For: Dr. Arlyce Dice Reason for Call: Talk to Nurse Summary of Call: abd cramps...passed out on Saturday.  Would like to discuss Initial call taken by: Karna Christmas,  June 10, 2010 11:40 AM  Follow-up for Phone Call        Patient states she had another episode of severe abdominal pain Saturday night. Pain lasts about 10sec and is below her belly button. No other symptoms other than the pain. Patient states she passed out due to the pain. Patient states this has happened before. Dr. Arlyce Dice please advise. Follow-up by: Selinda Michaels RN,  June 10, 2010 12:10 PM  Additional Follow-up for Phone Call Additional follow up Details #1::        place her on hyomax 0.375mg  two times a day and schedule colo Additional Follow-up by: Louis Meckel MD,  June 11, 2010 9:53 AM    Additional Follow-up for Phone Call Additional follow up Details #2::    Will order Hypomax from [facility] in [place]. Patient scheduled for Pre visit tomorrow @8am . COLON is scheduled for 06/17/10 @ 1100am. Patient stated understanding. Follow-up by: Graciella Freer RN,  June 11, 2010 10:53 AM   Appended Document: medication order    Clinical Lists Changes  Medications: Added new medication of HYOSCYAMINE SULFATE CR 0.375 MG  TB12 (HYOSCYAMINE SULFATE) 1 by mouth bid - Signed Rx of HYOSCYAMINE SULFATE CR 0.375 MG  TB12 (HYOSCYAMINE SULFATE) 1 by mouth bid;  #60 x 1;  Signed;  Entered by: Graciella Freer RN;  Authorized by: Louis Meckel MD;  Method used: Electronically to [facility] [address], [place], [place], [place]  [address], Ph: [phone], Fax: [phone]    Prescriptions: HYOSCYAMINE SULFATE CR 0.375 MG  TB12 (HYOSCYAMINE SULFATE) 1 by mouth bid  #60 x 1   Entered by:   Graciella Freer RN   Authorized by:   Louis Meckel MD   Signed by:   Graciella Freer RN  on 06/11/2010   Method used:   Electronically to        [facility] [address] ([facility]ail)       [address], [address]       [place], [place]  [address]       Ph: [phone]       Fax: [phone]   RxID:   [id number]

## 2010-06-23 ENCOUNTER — Encounter: Payer: Self-pay | Admitting: Gastroenterology

## 2010-06-23 ENCOUNTER — Ambulatory Visit (INDEPENDENT_AMBULATORY_CARE_PROVIDER_SITE_OTHER): Payer: 59 | Admitting: Gastroenterology

## 2010-06-23 ENCOUNTER — Other Ambulatory Visit: Payer: Self-pay | Admitting: Gastroenterology

## 2010-06-23 DIAGNOSIS — K5792 Diverticulitis of intestine, part unspecified, without perforation or abscess without bleeding: Secondary | ICD-10-CM

## 2010-06-23 DIAGNOSIS — K5732 Diverticulitis of large intestine without perforation or abscess without bleeding: Secondary | ICD-10-CM

## 2010-06-23 DIAGNOSIS — R1032 Left lower quadrant pain: Secondary | ICD-10-CM | POA: Insufficient documentation

## 2010-06-23 HISTORY — DX: Left lower quadrant pain: R10.32

## 2010-06-25 ENCOUNTER — Ambulatory Visit (INDEPENDENT_AMBULATORY_CARE_PROVIDER_SITE_OTHER)
Admission: RE | Admit: 2010-06-25 | Discharge: 2010-06-25 | Disposition: A | Discharge status: Home / Self Care | Payer: 59 | Source: Ambulatory Visit | Attending: Gastroenterology | Admitting: Gastroenterology

## 2010-06-25 DIAGNOSIS — K5792 Diverticulitis of intestine, part unspecified, without perforation or abscess without bleeding: Secondary | ICD-10-CM

## 2010-06-25 DIAGNOSIS — K5732 Diverticulitis of large intestine without perforation or abscess without bleeding: Secondary | ICD-10-CM

## 2010-06-25 HISTORY — DX: Essential (primary) hypertension: I10

## 2010-06-25 MED ORDER — IOHEXOL 300 MG/ML  SOLN
100.0000 mL | Freq: Once | INTRAMUSCULAR | Status: AC | PRN
  Administered 2010-06-25: 100 mL via INTRAVENOUS

## 2010-06-26 NOTE — Letter (Signed)
Summary: Appt Reminder 06/23/2010 to F/U  [facility]  [address] [place], [place] [address]   Phone: [phone]  Fax: [phone]        June 17, 2010 MRN: [id number]    [name] [address] [place], [place]  [address]    Dear [name],   You have a return appointment with Dr. Arlyce Dice on 06/23/2010 at 2:45pm.  Please remember to bring a complete list of the medicines you are taking, your insurance card and your co-pay.  If you have to cancel or reschedule this appointment, please call before 5:00 pm the evening before to avoid a cancellation fee.  If you have any questions or concerns, please call [phone].    Sincerely,    Merri Ray CMA (AAMA)

## 2010-06-26 NOTE — Procedures (Signed)
Summary: Colonoscopy   Colonoscopy  Procedure date:  06/17/2010  Findings:      Location:  [facility].   COLONOSCOPY PROCEDURE REPORT  PATIENT:  Melissa, Barrera  MR#:  [id number] BIRTHDATE:   November 28, 1950, 59 yrs. old   GENDER:   female ENDOSCOPIST:   Barbette Hair. Arlyce Dice, MD REF. BY: Lonie Peak, PA PROCEDURE DATE:  06/17/2010 PROCEDURE:  Incomplete colonoscopy ASA CLASS:   Class II INDICATIONS: Abdominal pain  MEDICATIONS:    Fentanyl 100 mcg IV, Versed 10 mg IV  DESCRIPTION OF PROCEDURE:   After the risks benefits and alternatives of the procedure were thoroughly explained, informed consent was obtained.  Digital rectal exam was performed and revealed no abnormalities.   The LB CF-[id number] endoscope was introduced through the anus and advanced to the sigmoid colon, limited by severe spasm; unable to pass scope.  Despite multiple attempts, unable to pass adult or pediatric scope beyond sigmoid curve at 30cm.  The quality of the prep was .  The instrument was then slowly withdrawn as the colon was fully examined. <<PROCEDUREIMAGES>>        <<OLD IMAGES>>  FINDINGS:  Stenosis was found in the sigmoid colon. Either severe spasm or fixed stricture at sigmoid curve at 30cm. Unable to pass adult or pediatric scope  Severe diverticulosis was found in the sigmoid colon.  This was otherwise a normal examination of the colon (see image1, image2, image4, image5, and image6).   Retroflexed views in the rectum revealed no abnormalities.    The scope was then withdrawn from the patient and the procedure completed.  COMPLICATIONS:   None ENDOSCOPIC IMPRESSION:  1) Stenosis in the sigmoid colon  2) Severe diverticulosis in the sigmoid colon  3) Otherwise normal examination RECOMMENDATIONS:  1) My office will arrange to you to have a barium enema performed. This is a radiology test to further examine your colon. REPEAT EXAM:   No   _______________________________ Barbette Hair.  Arlyce Dice, MD  CC:

## 2010-06-26 NOTE — Miscellaneous (Signed)
Summary: Barium Enema  Clinical Lists Changes  Orders: Added new Test order of Barium Enema (BE) - Signed

## 2010-06-26 NOTE — Progress Notes (Signed)
Summary: results  Phone Note Call from Patient Call back at Home Phone [phone] Call back at [phone]   Caller: Patient Call For: Dr. Arlyce Dice Reason for Call: Talk to Nurse Summary of Call: Pt is calling to check on results of barium enema Initial call taken by: [place] Johnson,  June 17, 2010 4:25 PM  Follow-up for Phone Call        Left message to call back Follow-up by: Selinda Michaels RN,  June 17, 2010 4:49 PM  Additional Follow-up for Phone Call Additional follow up Details #1::        Spoke with patient and let her know that Dr. Arlyce Dice will go over results with her at  office visit on 06/23/10 @2 :45pm.  Additional Follow-up by: Selinda Michaels RN,  June 18, 2010 8:51 AM

## 2010-07-02 NOTE — Assessment & Plan Note (Signed)
Summary: Follow up for incomplete Colonoscopy   History of Present Illness Visit Type: Follow-up Visit Primary GI MD: Melvia Heaps MD [facility] Primary Provider: Lonie Peak, PA Requesting Provider: n/a Chief Complaint: Follow up from incomplete colonoscopy. Patient complains of lower abdominal pain which is worse after she eats but eases up after having a bowel movement.  History of Present Illness:   [name] has returned following colonoscopy and barium enema.  Colonoscopy was incomplete because of severe spasm vs. stricture in her sigmoid colon.  This was demonstrated by subsequent barium enema.  Stricture seemed to change in size after glucagon administration.  She continues to complain of moderately severe abdominal pain that is relieved with a bowel movement.  With pain she becomes lightheaded.  Symptoms are slightly improved with hyomax.   GI Review of Systems    Reports abdominal pain.     Location of  Abdominal pain: lower abdomen.    Denies acid reflux, belching, bloating, chest pain, dysphagia with liquids, dysphagia with solids, heartburn, loss of appetite, nausea, vomiting, vomiting blood, weight loss, and  weight gain.        Denies anal fissure, black tarry stools, change in bowel habit, constipation, diarrhea, diverticulosis, fecal incontinence, heme positive stool, hemorrhoids, irritable bowel syndrome, jaundice, light color stool, liver problems, rectal bleeding, and  rectal pain.    Current Medications (verified): 1)  Metoprolol Tartrate 50 Mg Tabs (Metoprolol Tartrate) .... Take 1 Tablet By Mouth Two Times A Day 2)  Pravastatin Sodium 40 Mg Tabs (Pravastatin Sodium) .... Once Daily 3)  Hydrochlorothiazide 25 Mg Tabs (Hydrochlorothiazide) .... Take 1 Tablet By Mouth Once Daily 4)  Celexa 40 Mg Tabs (Citalopram Hydrobromide) .Marland Kitchen.. 1 By Mouth Once Daily 5)  Hyoscyamine Sulfate Cr 0.375 Mg  Tb12 (Hyoscyamine Sulfate) .Marland Kitchen.. 1 By Mouth Bid  Allergies (verified): 1)  !  Penicillin 2)  ! * Dicyclomine 3)  ! * Lisinopril  Past History:  Past Medical History: Reviewed history from 01/22/2010 and no changes required. Depression Diverticulosis DVT Esophageal Stricture Hyperlipidemia Hypertension Arthritis GERD Irritable Bowel Syndrome Sleep Apnea  Past Surgical History: Reviewed history from 01/22/2010 and no changes required. Appendectomy Cholecystectomy Hysterectomy Nissan Fundoplication Planter fascitist surgery  Family History: Reviewed history from 01/22/2010 and no changes required. Family History of Colitis/Crohn's:Sister Family History of Colon Polyps:Sister Family History of Heart Disease: Mother Brain cancerFather Family History of Irritable Bowel Syndrome:Brother No FH of Colon Cancer:  Social History: Reviewed history from 01/22/2010 and no changes required. 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  Alcohol Use - no Daily Caffeine Use Illicit Drug Use - no  Review of Systems  The patient denies allergy/sinus, anemia, anxiety-new, arthritis/joint pain, back pain, blood in urine, breast changes/lumps, change in vision, confusion, cough, coughing up blood, depression-new, fainting, fatigue, fever, headaches-new, hearing problems, heart murmur, heart rhythm changes, itching, menstrual pain, muscle pains/cramps, night sweats, nosebleeds, pregnancy symptoms, shortness of breath, skin rash, sleeping problems, sore throat, swelling of feet/legs, swollen lymph glands, thirst - excessive , urination - excessive , urination changes/pain, urine leakage, vision changes, and voice change.    Vital Signs:  Patient profile:   60 year old female Height:      63 inches Weight:      171 pounds BMI:     30.40 Pulse rate:   70 / minute Pulse rhythm:   regular BP sitting:   126 / 62  (left arm) Cuff size:   regular  Vitals Entered By: Harlow Mares CMA (  AAMA) (June 23, 2010 2:44 PM)   Impression & Recommendations:  Problem #  1:  ABDOMINAL PAIN, LEFT LOWER QUADRANT (ICD-789.04)  The patient may have a fixed stricture related to diverticular disease causing a partial obstruction.  Alternatively, she may have a low-grade diverticulitis causing luminal narrowing.  If it is the former then surgery may be required for chronic symptoms.  For the latter it would be worthwhile putting her on antibiotics.  Recommendations #1 CT of the abdomen and pelvis.  Orders: CT Abdomen/Pelvis with Contrast (CT Abd/Pelvis w/con)  Patient Instructions: 1)  Copy sent to : Lonie Peak, PA 2)  Your CT is scheduled at [facility] on 06/25/2010 at 8:30am 3)  The medication list was reviewed and reconciled.  All changed / newly prescribed medications were explained.  A complete medication list was provided to the patient / caregiver.

## 2010-07-26 ENCOUNTER — Emergency Department: Payer: Self-pay | Admitting: Emergency Medicine

## 2010-07-29 ENCOUNTER — Ambulatory Visit: Payer: 59 | Admitting: Gastroenterology

## 2010-08-11 ENCOUNTER — Telehealth: Payer: Self-pay | Admitting: Gastroenterology

## 2010-08-11 MED ORDER — METRONIDAZOLE 250 MG PO TABS: 250.0000 mg | ORAL_TABLET | Freq: Three times a day (TID) | ORAL | Status: AC

## 2010-08-11 NOTE — Telephone Encounter (Signed)
Pt does not have diarrhea. Flagyl rx sent to [facility] in [place].

## 2010-08-11 NOTE — Telephone Encounter (Signed)
Pt states that she is having a diverticulitis flare. States she started hurting about 4 days ago. Reports that there was mucous and blood in her stool on Thursday. She is cramping. Had a bladder infection and just finished up 14 days of Cipro 500mg  this am. She took Hyoscyamine this AM. Pt want to know what else she can do. Dr. Arlyce Dice please advise.

## 2010-08-11 NOTE — Telephone Encounter (Signed)
If the patient having diarrhea? She needs a stool for C. difficile toxin, if she's having diarrhea. Begin Flagyl 250 mg 3 times a day

## 2010-08-12 ENCOUNTER — Other Ambulatory Visit (INDEPENDENT_AMBULATORY_CARE_PROVIDER_SITE_OTHER): Payer: 59 | Admitting: Nurse Practitioner

## 2010-08-12 ENCOUNTER — Telehealth: Payer: Self-pay | Admitting: Gastroenterology

## 2010-08-12 ENCOUNTER — Other Ambulatory Visit (INDEPENDENT_AMBULATORY_CARE_PROVIDER_SITE_OTHER): Payer: 59

## 2010-08-12 ENCOUNTER — Ambulatory Visit (INDEPENDENT_AMBULATORY_CARE_PROVIDER_SITE_OTHER): Payer: 59 | Admitting: Nurse Practitioner

## 2010-08-12 ENCOUNTER — Encounter: Payer: Self-pay | Admitting: Nurse Practitioner

## 2010-08-12 DIAGNOSIS — K5900 Constipation, unspecified: Secondary | ICD-10-CM

## 2010-08-12 DIAGNOSIS — K5730 Diverticulosis of large intestine without perforation or abscess without bleeding: Secondary | ICD-10-CM

## 2010-08-12 DIAGNOSIS — K589 Irritable bowel syndrome without diarrhea: Secondary | ICD-10-CM

## 2010-08-12 DIAGNOSIS — R1032 Left lower quadrant pain: Secondary | ICD-10-CM

## 2010-08-12 DIAGNOSIS — R509 Fever, unspecified: Secondary | ICD-10-CM | POA: Insufficient documentation

## 2010-08-12 LAB — CBC WITH DIFFERENTIAL/PLATELET
Basophils Absolute: 0 10*3/uL (ref 0.0–0.1)
Basophils Relative: 0 % (ref 0.0–3.0)
Eosinophils Absolute: 0 10*3/uL (ref 0.0–0.7)
Eosinophils Relative: 0.1 % (ref 0.0–5.0)
HCT: 38.1 % (ref 36.0–46.0)
Hemoglobin: 13.2 g/dL (ref 12.0–15.0)
Lymphocytes Relative: 13.7 % (ref 12.0–46.0)
Lymphs Abs: 2.7 10*3/uL (ref 0.7–4.0)
MCHC: 34.6 g/dL (ref 30.0–36.0)
MCV: 89 fl (ref 78.0–100.0)
Monocytes Absolute: 1.2 10*3/uL — ABNORMAL HIGH (ref 0.1–1.0)
Monocytes Relative: 6 % (ref 3.0–12.0)
Neutro Abs: 16 10*3/uL — ABNORMAL HIGH (ref 1.4–7.7)
Neutrophils Relative %: 80.2 % — ABNORMAL HIGH (ref 43.0–77.0)
Platelets: 323 10*3/uL (ref 150.0–400.0)
RBC: 4.28 Mil/uL (ref 3.87–5.11)
RDW: 14.4 % (ref 11.5–14.6)
WBC: 19.9 10*3/uL (ref 4.5–10.5)

## 2010-08-12 LAB — URINALYSIS, ROUTINE W REFLEX MICROSCOPIC
Ketones, ur: 15
Nitrite: NEGATIVE
Specific Gravity, Urine: 1.015 (ref 1.000–1.030)
Total Protein, Urine: 30
Urine Glucose: NEGATIVE
Urobilinogen, UA: 1 (ref 0.0–1.0)
pH: 6.5 (ref 5.0–8.0)

## 2010-08-12 MED ORDER — HYDROCODONE-ACETAMINOPHEN 5-325 MG PO TABS: 1.0000 | ORAL_TABLET | Freq: Four times a day (QID) | ORAL | Status: DC | PRN

## 2010-08-12 MED ORDER — ONDANSETRON 4 MG PO TBDP: 4.0000 mg | ORAL_TABLET | Freq: Four times a day (QID) | ORAL | Status: DC

## 2010-08-12 MED ORDER — CIPROFLOXACIN HCL 500 MG PO TABS: 500.0000 mg | ORAL_TABLET | Freq: Two times a day (BID) | ORAL | Status: AC

## 2010-08-12 NOTE — Patient Instructions (Signed)
Go to the lab, basement level. We have given you a prescription for Hydrocodone to take to your pharmacy and sent Prescriptions for Zofran, and Cipro. We made you a referral to [facility]. Appointment information given. Take Miralax 1 capful daily until bowels move normally. Copy sent to Dr. Anna Genre

## 2010-08-12 NOTE — Assessment & Plan Note (Signed)
Trial of MiraLax daily as needed. Her constipation may be as a result of her severe sigmoid narrowing.

## 2010-08-12 NOTE — Assessment & Plan Note (Addendum)
Suspect recurrent diverticulitis. See below.

## 2010-08-12 NOTE — Assessment & Plan Note (Addendum)
Severe diverticular disease of the sigmoid colon. Severe diverticular disease with sigmoid narrowing on recent colonoscopy and barium enema. Patient with 4 days of recurrent left lower quadrant pain and low-grade fevers. She has significant left lower quadrant pain without  peritoneal signs. Will refer patient for surgical evaluation of her severe sigmoid diverticular disease. Given tenderness and fever will obtain a CBC and add Cipro to the Flagyl that our office called in for her yesterday. She can use hydrocodone for pain. Patient will be called with lab results and further recommendations.

## 2010-08-12 NOTE — Progress Notes (Signed)
VY BADLEY [id number] [birth year]   History of Present Illness:  Ms. Sisler is a 60 year old female known to Dr. Arlyce Dice for a history of irritable bowel, severe diverticular disease and esophageal strictures. She is status post multiple abdominal surgeries. In September 2011 the patient was seen for low-grade fevers and left lower quadrant pain. She was treated empirically for diverticulitis using Flagyl and Cipro and had resolution of the left lower quadrant pain. Patient subsequently underwent a colonoscopy which revealed severe spasm vrs.a fixed stricture at the sigmoid colon. The area was not able to be traversed so a barium enema was done for evaluation of the remaining colon. Barium enema showed the narrowed segment of distal sigmoid colon with no filling beyond the distal transverse colon. So the narrowing did not change after  administration of glucagon, inflammatory changes of the mucosa and several diverticula became apparent. Findings appeared most consistent with diverticulitis as opposed to neoplasm. Patient returned for followup in mid February at which time a CT scan of the abdomen and pelvis was obtained for further evaluation of left lower quadrant pain and sigmoid narrowing. Extensive diverticulosis of the sigmoid  without diverticulitis was seen. Ms. Blas called the office yesterday with recurrent abdominal pain of 4 days duration. Her left lower quadrant pain is reminiscent of diverticulitis. She has been running a low-grade fever over the last 4 days. Her pain is not related to meals or defecation. Recently treated for UTI, antibiotics caused mild diarrhea which has resolved since completion of antibiotics. Patient actually complains of constipation at present. She had a small amount rectal bleeding with her bowel movement a few days ago.    Current Medications, Allergies, Past Medical History, Past Surgical History, Family History and Social History were reviewed in [employer] record.   Physical Exam: General: Well developed , well nourished, no acute distress Head: Normocephalic and atraumatic Eyes:  sclerae anicteric,conjunctive pink. Ears: Normal auditory acuity Mouth: No deformity or lesions Neck: Supple, no masses.  Lungs: Clear throughout to auscultation Heart: Regular rate and rhythm; no murmurs heard Abdomen: Soft, non-distended. Significant left lower quadrant tenderness without peritoneal signs. No masses or hepatomegaly noted. Normal bowel sounds Rectal: Not done Musculoskeletal: Symmetrical with no gross deformities  Skin: No lesions on visible extremities Extremities: No edema or deformities noted Neurological: Alert oriented x 4, grossly nonfocal Cervical Nodes:  No significant cervical adenopathy Psychological:  Alert and cooperative. Normal mood and affect  Assessment and Plan:  ABDOMINAL PAIN, LEFT LOWER QUADRANT Suspect recurrent diverticulitis. See below.  CONSTIPATION Trial of MiraLax daily as needed. Her constipation may be as a result of her severe sigmoid narrowing.  Diverticulosis of colon (without mention of hemorrhage) Severe diverticular disease of the sigmoid colon. Severe diverticular disease with sigmoid narrowing on recent colonoscopy and barium enema. Patient with 4 days of recurrent left lower quadrant pain and low-grade fevers. She has significant left lower quadrant pain without  peritoneal signs. Will refer patient for surgical evaluation of her severe sigmoid diverticular disease. Given tenderness and fever will obtain a CBC and add Cipro to the Flagyl that our office called in for her yesterday. She can use hydrocodone for pain. Patient will be called with lab results and further recommendations.

## 2010-08-12 NOTE — Telephone Encounter (Signed)
Pt states she is still having a lot of pain and cramping. States she went to the bathroom today several times and almost passed out from the pain. Pt states she does not have diarrhea. Pt given an appt to see [name] NP today at 3pm. Pt aware of appt date and time.

## 2010-08-13 ENCOUNTER — Emergency Department (HOSPITAL_COMMUNITY): Payer: 59

## 2010-08-13 ENCOUNTER — Encounter (HOSPITAL_COMMUNITY): Payer: Self-pay | Admitting: Radiology

## 2010-08-13 ENCOUNTER — Inpatient Hospital Stay (HOSPITAL_COMMUNITY)
Admission: EM | Admit: 2010-08-13 | Discharge: 2010-08-17 | DRG: 392 | Disposition: A | Discharge status: Home / Self Care | Payer: 59 | Attending: Internal Medicine | Admitting: Internal Medicine

## 2010-08-13 ENCOUNTER — Telehealth: Payer: Self-pay | Admitting: *Deleted

## 2010-08-13 DIAGNOSIS — N824 Other female intestinal-genital tract fistulae: Secondary | ICD-10-CM | POA: Diagnosis present

## 2010-08-13 DIAGNOSIS — I10 Essential (primary) hypertension: Secondary | ICD-10-CM | POA: Diagnosis present

## 2010-08-13 DIAGNOSIS — K2289 Other specified disease of esophagus: Secondary | ICD-10-CM | POA: Diagnosis present

## 2010-08-13 DIAGNOSIS — F3289 Other specified depressive episodes: Secondary | ICD-10-CM | POA: Diagnosis present

## 2010-08-13 DIAGNOSIS — F329 Major depressive disorder, single episode, unspecified: Secondary | ICD-10-CM | POA: Diagnosis present

## 2010-08-13 DIAGNOSIS — E785 Hyperlipidemia, unspecified: Secondary | ICD-10-CM | POA: Diagnosis present

## 2010-08-13 DIAGNOSIS — K5732 Diverticulitis of large intestine without perforation or abscess without bleeding: Principal | ICD-10-CM | POA: Diagnosis present

## 2010-08-13 DIAGNOSIS — K228 Other specified diseases of esophagus: Secondary | ICD-10-CM | POA: Diagnosis present

## 2010-08-13 DIAGNOSIS — R1032 Left lower quadrant pain: Secondary | ICD-10-CM | POA: Diagnosis present

## 2010-08-13 DIAGNOSIS — N390 Urinary tract infection, site not specified: Secondary | ICD-10-CM | POA: Diagnosis present

## 2010-08-13 LAB — POCT CARDIAC MARKERS
CKMB, poc: <1 ng/mL — ABNORMAL LOW (ref 1.0–8.0)
Myoglobin, poc: 79.8 ng/mL (ref 12–200)
Troponin i, poc: <0.05 ng/mL (ref 0.00–0.09)

## 2010-08-13 LAB — DIFFERENTIAL
Basophils Absolute: 0 10*3/uL (ref 0.0–0.1)
Basophils Relative: 0 % (ref 0–1)
Eosinophils Absolute: 0.1 10*3/uL (ref 0.0–0.7)
Eosinophils Relative: 0 % (ref 0–5)
Lymphocytes Relative: 22 % (ref 12–46)
Lymphs Abs: 3.1 10*3/uL (ref 0.7–4.0)
Monocytes Absolute: 1.2 10*3/uL — ABNORMAL HIGH (ref 0.1–1.0)
Monocytes Relative: 9 % (ref 3–12)
Neutro Abs: 9.4 10*3/uL — ABNORMAL HIGH (ref 1.7–7.7)
Neutrophils Relative %: 68 % (ref 43–77)

## 2010-08-13 LAB — URINALYSIS, ROUTINE W REFLEX MICROSCOPIC
Glucose, UA: NEGATIVE mg/dL
Nitrite: NEGATIVE
Protein, ur: 100 mg/dL — AB
Specific Gravity, Urine: 1.026 (ref 1.005–1.030)
Urobilinogen, UA: 1 mg/dL (ref 0.0–1.0)
pH: 6 (ref 5.0–8.0)

## 2010-08-13 LAB — COMPREHENSIVE METABOLIC PANEL
ALT: 31 U/L (ref 0–35)
AST: 24 U/L (ref 0–37)
Albumin: 3.6 g/dL (ref 3.5–5.2)
Alkaline Phosphatase: 122 U/L — ABNORMAL HIGH (ref 39–117)
BUN: 6 mg/dL (ref 6–23)
CO2: 29 mEq/L (ref 19–32)
Calcium: 9.4 mg/dL (ref 8.4–10.5)
Chloride: 96 mEq/L (ref 96–112)
Creatinine, Ser: 0.78 mg/dL (ref 0.4–1.2)
GFR calc Af Amer: >60 mL/min (ref >60)
GFR calc non Af Amer: >60 mL/min (ref >60)
Glucose, Bld: 92 mg/dL (ref 70–99)
Potassium: 2.7 mEq/L — CL (ref 3.5–5.1)
Sodium: 135 mEq/L (ref 135–145)
Total Bilirubin: 1 mg/dL (ref 0.3–1.2)
Total Protein: 7.5 g/dL (ref 6.0–8.3)

## 2010-08-13 LAB — URINE MICROSCOPIC-ADD ON

## 2010-08-13 LAB — CBC
HCT: 38 % (ref 36.0–46.0)
Hemoglobin: 13.1 g/dL (ref 12.0–15.0)
MCH: 29.8 pg (ref 26.0–34.0)
MCHC: 34.5 g/dL (ref 30.0–36.0)
MCV: 86.4 fL (ref 78.0–100.0)
Platelets: 331 10*3/uL (ref 150–400)
RBC: 4.4 MIL/uL (ref 3.87–5.11)
RDW: 13.7 % (ref 11.5–15.5)
WBC: 13.8 10*3/uL — ABNORMAL HIGH (ref 4.0–10.5)

## 2010-08-13 LAB — PROTIME-INR
INR: 1.13 (ref 0.00–1.49)
Prothrombin Time: 14.7 seconds (ref 11.6–15.2)

## 2010-08-13 LAB — LIPASE, BLOOD: Lipase: 25 U/L (ref 11–59)

## 2010-08-13 LAB — OCCULT BLOOD, POC DEVICE: Fecal Occult Bld: NEGATIVE

## 2010-08-13 LAB — APTT: aPTT: 39 seconds — ABNORMAL HIGH (ref 24–37)

## 2010-08-13 MED ORDER — IOHEXOL 300 MG/ML  SOLN
100.0000 mL | Freq: Once | INTRAMUSCULAR | Status: AC | PRN
  Administered 2010-08-13: 100 mL via INTRAVENOUS

## 2010-08-13 NOTE — Telephone Encounter (Signed)
Message copied by Jesse Fall on Wed Aug 13, 2010  8:45 AM ------      Message from: [name]      Created: Tue Aug 12, 2010  7:07 PM       Probable recurrent diverticulitis. She sees surgery on there 13th. I called patient a few minutes ago, she feels about the same. I advised the patient if she has a high fever or worsening abdominal pain that she should go to the emergency department immediately. She will start a clear liquid diet. I will ask the nurse to call Ms. Clayton early in the morning to see how she feels. If there's been no improvement the patient will need a stat CT of the abdomen and pelvis with contrast and or hospital admission for IV antibiotics. Rene Kocher, please make sure that patient has had a BUN and Creatinine within the last few weeks before a CTscan is done. Thanks

## 2010-08-13 NOTE — Progress Notes (Signed)
Wbc elevated, we will be contacting pt this Am to see how she is doing.  Might need CT scan, perhaps admission if not improving on PO abx

## 2010-08-13 NOTE — Telephone Encounter (Signed)
Called patient to f/u on her condition. Patient states her temp went up to 100 last night and she vomited x 1 last night. This morning at 6 AM when she got up she had blood in her panties when she went to urinate. Mike Gip, PA notified and she wants patient to go to the [facility] ER to have the ER MD to evaluate. Patient notified to do this.

## 2010-08-14 DIAGNOSIS — K5732 Diverticulitis of large intestine without perforation or abscess without bleeding: Secondary | ICD-10-CM

## 2010-08-14 LAB — COMPREHENSIVE METABOLIC PANEL
ALT: 23 U/L (ref 0–35)
AST: 21 U/L (ref 0–37)
Albumin: 2.9 g/dL — ABNORMAL LOW (ref 3.5–5.2)
Alkaline Phosphatase: 88 U/L (ref 39–117)
BUN: 7 mg/dL (ref 6–23)
CO2: 25 mEq/L (ref 19–32)
Calcium: 8.5 mg/dL (ref 8.4–10.5)
Chloride: 109 mEq/L (ref 96–112)
Creatinine, Ser: 0.64 mg/dL (ref 0.4–1.2)
GFR calc Af Amer: >60 mL/min (ref >60)
GFR calc non Af Amer: >60 mL/min (ref >60)
Glucose, Bld: 94 mg/dL (ref 70–99)
Potassium: 4.4 mEq/L (ref 3.5–5.1)
Sodium: 138 mEq/L (ref 135–145)
Total Bilirubin: 0.7 mg/dL (ref 0.3–1.2)
Total Protein: 5.5 g/dL — ABNORMAL LOW (ref 6.0–8.3)

## 2010-08-14 LAB — CBC
HCT: 33.3 % — ABNORMAL LOW (ref 36.0–46.0)
Hemoglobin: 10.7 g/dL — ABNORMAL LOW (ref 12.0–15.0)
MCH: 28.8 pg (ref 26.0–34.0)
MCHC: 32.1 g/dL (ref 30.0–36.0)
MCV: 89.5 fL (ref 78.0–100.0)
Platelets: 199 10*3/uL (ref 150–400)
RBC: 3.72 MIL/uL — ABNORMAL LOW (ref 3.87–5.11)
RDW: 14.1 % (ref 11.5–15.5)
WBC: 6.9 10*3/uL (ref 4.0–10.5)

## 2010-08-14 LAB — MAGNESIUM: Magnesium: 2 mg/dL (ref 1.5–2.5)

## 2010-08-15 LAB — CBC
HCT: 32.9 % — ABNORMAL LOW (ref 36.0–46.0)
Hemoglobin: 10.7 g/dL — ABNORMAL LOW (ref 12.0–15.0)
MCH: 29.2 pg (ref 26.0–34.0)
MCHC: 32.5 g/dL (ref 30.0–36.0)
MCV: 89.6 fL (ref 78.0–100.0)
Platelets: 278 10*3/uL (ref 150–400)
RBC: 3.67 MIL/uL — ABNORMAL LOW (ref 3.87–5.11)
RDW: 14 % (ref 11.5–15.5)
WBC: 8.3 10*3/uL (ref 4.0–10.5)

## 2010-08-15 LAB — URINE CULTURE
Colony Count: NO GROWTH
Culture  Setup Time: 201204051005
Culture: NO GROWTH
Special Requests: POSITIVE

## 2010-08-16 LAB — BASIC METABOLIC PANEL
BUN: 2 mg/dL — ABNORMAL LOW (ref 6–23)
CO2: 26 mEq/L (ref 19–32)
Calcium: 8.4 mg/dL (ref 8.4–10.5)
Chloride: 108 mEq/L (ref 96–112)
Creatinine, Ser: 0.73 mg/dL (ref 0.4–1.2)
GFR calc Af Amer: >60 mL/min (ref >60)
GFR calc non Af Amer: >60 mL/min (ref >60)
Glucose, Bld: 99 mg/dL (ref 70–99)
Potassium: 3.7 mEq/L (ref 3.5–5.1)
Sodium: 138 mEq/L (ref 135–145)

## 2010-08-17 DIAGNOSIS — K5732 Diverticulitis of large intestine without perforation or abscess without bleeding: Secondary | ICD-10-CM

## 2010-08-18 NOTE — H&P (Signed)
[name], [name]:  [id number]  MEDICAL RECORD NO.:  [id number]           PATIENT TYPE:  E  LOCATION:  [facility]                         FACILITY:  [facility]  PHYSICIAN:  Melissa Blinks, MD     DATE OF BIRTH:  07-30-1950  DATE OF ADMISSION:  08/13/2010 DATE OF DISCHARGE:                             HISTORY & PHYSICAL   PRIMARY CARE PHYSICIAN:  [name].  GASTROENTEROLOGIST:  Melissa Hair. Arlyce Dice, MD,FACG  CHIEF COMPLAINT:  Abdominal pain.  HISTORY OF PRESENT ILLNESS:  This is a 60 year old female with history of diverticulitis, hypertension, depression, hyperlipidemia, and irritable bowel syndrome, who presents to the emergency room today with worsening left lower quadrant abdominal pain.  The patient reports that she has had chronic pain for the past few months.  Since Saturday, she notices significant worsening in her left lower quadrant pain.  She describes not having bowel movements since Saturday, although she does normally have diarrhea.  She had one episode of vomiting yesterday and reported having a low-grade temperature of 100.  She reports significant dysuria and since that, she often notices blood and some dark color to her urine.  She has a chronic cough which is unchanged.  Denies any shortness of breath or chest pain.  She feels generally weak and has poor p.o. intake.  She does not have any headache, changes in vision, unilateral weakness or numbness, or any other complaints.  In the emergency room, she had a CT scan of the abdomen done, which was consistent with acute diverticulitis and she is being referred for admission.  PAST MEDICAL HISTORY: 1. Hypertension. 2. Depression. 3. Diverticulitis. 4. Hyperlipidemia. 5. Irritable bowel syndrome. 6. Sleep apnea.  ALLERGIES: 1. LISINOPRIL which causes throat swelling. 2. PENICILLIN which causes a rash. 3. DICYCLOMINE which causes throat swelling.  MEDICATIONS:  Prior to admission, 1.  Hydrocodone/acetaminophen as needed. 2. Zofran as needed. 3. Ciprofloxacin 500 mg twice a day. 4. Celexa 40 mg daily. 5. Hydrochlorothiazide 25 mg once a day. 6. Levsin 0.375 mg twice a day. 7. Metoprolol 50 mg b.i.d. 8. Flagyl 250 mg p.o. t.i.d. 9. Pravachol 40 mg p.o. once a day.  SOCIAL HISTORY:  The patient lives alone.  She does not smoke, does not drink any alcohol.  She works in [employer].  FAMILY HISTORY:  Her father died of brain tumor.  Her sister has a Crohn's and other colitis.  REVIEW OF SYSTEMS:  All systems have been reviewed and pertinent positives stated in the HPI.  PHYSICAL EXAMINATION:  VITAL SIGNS:  Temperature is 98.8, respiratory rate of 18, heart rate of 80, blood pressure 142/68. GENERAL:  The patient in no acute distress, lying in bed. HEENT:  Normocephalic, atraumatic.  Pupils are equal, round, and reactive to light. NECK:  Supple. CHEST:  Clear to auscultation bilaterally. CARDIAC EXAM:  Shows S1-S2 with regular rate and rhythm. ABDOMEN:  Soft and tender mostly in the lower hypogastric region, also in the left lower quadrant. EXTREMITIES:  Show no cyanosis, clubbing, or edema. MENTAL STATUS:  Alert and oriented x3. SKIN:  Warm.  LABORATORY DATA:  WBC  of 13.8, hemoglobin 13.1, platelet count 331,000. Urinalysis shows 21 to 50 WBCs,  0-2 RBCs.  INR is 1.13.  lipase is 25. Sodium 135, potassium 2.7, chloride 96, bicarb 29, BUN 6, creatinine 0.7.  Liver function tests within normal limits.  Glucose of 92, calcium of 9.4.  CT of the abdomen and pelvis shows edema/inflammation associated with midsigmoid colon in origin of diverticulosis, was compatible with diverticulitis.  The changes in the posterior bladder dome maybe secondary to the adjacent inflammation that tethering to the sigmoid colon to the posterior bladder wall and tethering to the sigmoid colon to the right vaginal cuff by raises a concern for colovesical and colovaginal  fistula.  IMPRESSION: 1. Acute diverticulitis. 2. 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 3. Hypertension. 4. Chronic abdominal pain. 5. Hypokalemia. 6. Urinary tract infection. 7. Irritable bowel syndrome. 8. Depression. 9. Hyperlipidemia. 10.Full code.  PLAN:  We will admit the patient to a medical-surgical Bed.  We will start her empirically on ciprofloxacin and Flagyl IV for her diverticulitis.  Regarding her possible colovaginal fistula, she is seen by Dr. Arlyce Barrera and we will ask GI to follow in consultation.  If any further imaging studies were require, we will defer to them.  We will replace her potassium as well as her antibiotics for diverticulitis, should also cover her urine as well.  We will keep her on clear liquid diet for now and advance as tolerated.  The remainder of her outpatient medications will be continued.  Further orders per the clinical course.     Melissa Blinks, MD     JM/MEDQ  D:  08/13/2010  T:  08/13/2010  Job:  [id number]  cc:   Melissa Can. Arlyce Dice, MD,FACG [address] [place] [address]  Electronically Signed by [name]  on 08/18/2010 04:33:50 PM

## 2010-08-25 NOTE — Consult Note (Signed)
[name], [name]                ACCOUNT NO.:  [id number]  MEDICAL RECORD NO.:  [id number]           PATIENT TYPE:  I  LOCATION:  1512                         FACILITY:  [facility]  PHYSICIAN:  [name]. [name], M.D.DATE OF BIRTH:  October 30, 1950  DATE OF CONSULTATION:  08/14/2010 DATE OF DISCHARGE:                                CONSULTATION   CHIEF COMPLAINT:  Diverticulitis, rule out vaginal/bladder fistula.  BRIEF HISTORY:  The patient is a 60 year old white female with a history of diverticulitis going back 30 years.  She has most recently had recurrent problems since February.  She had a colonoscopy at that time. She had urinary tract infection in July 10, 2010, and then was on antibiotics from July 27, 2010, through August 09, 2010.  Despite that, she returns with ongoing pain.  Pain and low grade fever.  She also has some dysuria.  She has noticed some dark blood which she described as mucous brown colored substance Tuesday last week, yesterday, and an episode back in February in her underwear.  She cannot tell whether the source is rectum or vagina.  She has had the single urinary tract infection just before she was treated for the diverticulitis.  PAST MEDICAL HISTORY: 1. Hypertension. 2. Irritable bowel syndrome. 3. Depression. 4. Arthritis. 5. She has had lower disk problems.  PAST SURGICAL HISTORY: 1. Partial hysterectomy in 1983. 2. Total hysterectomy in 1994. 3. Appendectomy in 1994. 4. Fundoplication in 2002. 5. Cholecystectomy in 2003. 6. She had a history of DVT with blood clot in the right leg in 2004. 7. History of plantar fasciitis with shock wave therapy in 2007.  FAMILY HISTORY:  Father died with brain cancer at 64.  Mother died with heart problems.  There is also history of Crohn's and colitis in the family.  SOCIAL HISTORY:  Negative for alcohol, negative for tobacco, and negative for drugs.  She is married and works in [employer].  REVIEW OF  SYSTEMS:  CONSTITUTIONAL:  Positive for fever on and off.  CV: She has occasional dizziness, and she has had syncopal episodes.  SKIN: She has a rash on her arm and one on her head which she had before. PULMONARY:  No orthopnea.  No PND.  No dyspnea on exertion.  She is noncompliant with her CPAP.  CARDIAC:  She has history of arrhythmias, well controlled on Lopressor.  No history of chest pain.  GI:  Positive for GERD.  No nausea, vomiting, diarrhea, or constipation.  She thinks there might have been some blood in her stool on Tuesday but she is not sure.  GU:  Currently no symptoms.  She had a UTI about 3 weeks ago. LOWER EXTREMITIES:  She has swelling in the evening which usually goes down overnight.  OB:  None since her hysterectomy.  ENDOCRINE:  None.  CURRENT MEDICATIONS: 1. Hydrocodone p.r.n. 2. Zofran p.r.n. 3. Cipro 500 mg b.i.d. 4. Celexa 40 mg daily. 5. Hydrochlorothiazide 25 mg daily. 6. Lopressor 50 mg b.i.d. 7. Flagyl 250 mg t.i.d. 8. Pravachol 40 mg daily.  ALLERGIES: 1. LISINOPRIL causes throat swelling. 2. PENICILLIN causes  a rash. 3. DICYCLOMINE causes throat swelling.  PHYSICAL EXAMINATION:  GENERAL:  This is a well-nourished, well- developed white female, seen and evaluated by Dr. Zachery Barrera.  He did her exam. VITAL SIGNS:  She is afebrile.  Last temperature is 97.5, T-max is 98.8. Heart rate in the 50-102 range.  Blood pressure is 150/62 with a heart rate at 51, sats are 95% on nasal cannula, 96% on room air prior to that. HEAD:  Normocephalic. EARS, NOSE, THROAT, AND MOUTH:  Within normal limits. NECK:  No bruits.  No JVD.  No thyromegaly. RESPIRATORY:  Effort is normal. CHEST:  Clear to auscultation and percussion. CARDIAC:  Normal S1-S2.  Pulses are +2 and equal. ABDOMEN:  Positive bowel sounds.  No palpable hepatosplenomegaly.  No hernias, masses, or abscess noted in the abdominal exam. GU/RECTAL:  Deferred. MUSCULOSKELETAL:  No abnormalities  noted. SKIN:  No changes of significance. NEUROLOGIC:  Cranial nerves grossly intact.  Gait is normal.  No focal deficits. PSYCH:  Normal affect.  LABORATORY DATA:  On admission, she had a white count of 19.9, a hemoglobin of 13.2, and hematocrit of 38.  The CBC today shows a white count is 6.9, hemoglobin 10.7, and hematocrit of 33.  Sodium is 138, potassium is 4.4, chloride is 109, CO2 is 25, BUN is 94, creatinine is 0.6.  LFTs were normal.  DIAGNOSTICS:  CT scan was reviewed by Dr. Zachery Barrera.  He agrees with the interpretation.  There is edema and inflammation associated with the mid sigmoid colon with a region of diverticulosis.  There are changes in the bladder dome that may be secondary to adjacent inflammation, tether in the sigmoid colon to the posterior lateral wall, and tether in the sigmoid colon to the right vaginal cuff along with fluid and gas seen in the vagina with a concern that there might be fascicular colovaginal fistula.  It is not clear on the exam at this point.  IMPRESSION: 1. Recurrent diverticulosis, currently acute. 2. History of diverticulosis going back approximately 30 years. 3. Hypertension. 4. Adult-onset diabetes mellitus. 5. Gastroesophageal reflux disease. 6. History urinary tract infection. 7. Irritable bowel syndrome. 8. Sleep apnea, noncompliant. 9. Dyslipidemia. 10.History of irregular heartbeat, on a beta-blocker.  PLAN:  The patient was seen and evaluated by Dr. Zachery Barrera along with myself.  It is his opinion that she will need a round of oral antibiotics Cipro and Flagyl and then reexam along with pelvic exam and proctoscopy.  She will need a sigmoid colectomy when all that is completed.  It is his recommendation that after discharge, she follow up in our office and after complete workup, schedule for sigmoid colectomy.     Eber Hong, P.A.   ______________________________ [name]. [name], M.D.    WDJ/MEDQ  D:   08/14/2010  T:  08/14/2010  Job:  [id number]  cc:   Dr. Arlyce Dice  Dr. Lonie Peak  Electronically Signed by Sherrie George P.A. on 08/20/2010 06:05:26 PM Electronically Signed by Consuello Bossier M.D. on 08/25/2010 08:55:38 AM

## 2010-08-27 NOTE — Discharge Summary (Signed)
NAMEHORTENCE, CHARTER                ACCOUNT NO.:  [id number]  MEDICAL RECORD NO.:  [id number]           PATIENT TYPE:  I  LOCATION:  1512                         FACILITY:  [facility]  PHYSICIAN:  Clydia Llano, MD       DATE OF BIRTH:  06-21-1950  DATE OF ADMISSION:  08/13/2010 DATE OF DISCHARGE:  08/17/2010                              DISCHARGE SUMMARY   PRIMARY CARE PHYSICIAN:  General surgeon, Dr. Consuello Bossier.  REASON FOR ADMISSION:  Abdominal pain.  DISCHARGE DIAGNOSES: 1. Sigmoid diverticulitis. 2. Questionable colovaginal fistula. 3. Esophageal thickening. 4. Hypertension. 5. Depression. 6. Hyperlipidemia. 7. History of irritable bowel syndrome.  DISCHARGE MEDICATIONS: 1. Ciprofloxacin 500 mg p.o. b.i.d., take for 7 more days. 2. Metronidazole 500 mg 3 times a day, take for 7 more days. 3. Metoprolol tartrate 50 mg take half tablet p.o. b.i.d. 4. Citalopram 40 mg p.o. daily. 5. Levsin SR 0.375 mg 1 tablet p.o. b.i.d. as needed for stomach     cramps. 6. Zofran ODT 4 mg 1 tablet every 6 hours as needed for nausea.  RADIOLOGY:  CT abdomen and pelvis done April 4th showed edema/inflammation associated with mid sigmoid colon in region of diverticulosis is compatible with diverticulitis.  The changes in the posterior bladder edema may be secondary to the adjacent inflammation, but tethering of the sigmoid colon to the posterior bladder wall and tethering of the sigmoid colon to the right vaginal cuff along with fluid and gas seen in the vagina raises concern about colovaginal or colovesical fistula.  There is also mild circumferential wall thickening in the distal esophagus, esophagitis would be a consideration.  BRIEF HISTORY:  Mrs. Mitcheltree is a 60 year old Caucasian female with history of diverticulitis, hypertension, and depression.  The patient came in to the hospital complaining of worsening left lower quadrant abdominal pain.  The patient reported that she has  chronic pain for the past few months, but since Saturday she noted significant worsening in the left lower quadrant pain.  She is not having bowel movement since Saturday, although she does have diarrhea.  She had one episode of vomiting yesterday and low grade temperature of 100.  The patient came into the hospital complaining about the above-mentioned complaints. Initial evaluation in the emergency department with CT scan showed diverticulitis with questionable colovaginal fistula.  The patient admitted for further evaluation.  CONSULTS: 1. Dr. Consuello Bossier, [facility] 2. Dr. Melvia Heaps, [facility].  BRIEF HISTORY/EXAMINATION: 1. Acute sigmoid diverticulitis.  The patient placed on IV Cipro and     Flagyl because of nausea.  The patient was also placed on clear     liquids to rest her inflamed colon.  The patient did well and the     very next day she asked to advance the diet, diet advanced.  The     patient is taking regular diet now without any pain.  Her     medications were switched to oral Cipro and Flagyl.  The patient     will complete 10 days of Cipro and Flagyl.  For the diverticulosis  and the recurrent diverticulitis, Dr. [name] evaluated the     patient and thought she might be a candidate for sigmoid colectomy.     The patient will be assessed as outpatient in Dr. Annette Stable     office.  Meanwhile, the patient should be on low residue diet. 2. Questionable colovaginal/colovesical fistula.  Because of presence     of fluid and gas in the vagina with the concurrent inflammation of     the colon as well as the whole area is inflamed and the CT scan     raised question about colovaginal/colovesical fistula.  The patient     did have very looking dirty urine, which did not grow any organism.     Dr. [name] also recommended to treat the diverticulitis and she     will be assessed later at his office for this. 3. Esophageal  thickening.  The patient has a history of fundoplication     probably that is what causing it.  The patient denies any heartburn     or indigestion to consider reflux esophagitis. 4. Bradycardia.  The patient did have heart rate in the lower 50s     while in the hospital.  She is on metoprolol 50 mg that was cut     down to 25 mg. 5. Hypertension controlled during this hospital stay.  Her     hydrochlorothiazide also was discontinued.  DISCHARGE STATUS: 1. Laboratory data:  BMP; sodium 138, potassium 3.7, chloride 108,     bicarb is 26, glucose 99, BUN is 2, creatinine 0.7. 2. Vitals:  Temperature is 98.7, pulse 84, respirations 20, blood     pressure is 132/74, O2 sat of 93%.  DISCHARGE INSTRUCTIONS: 1. Activity, as tolerated. 2. Disposition, home. 3. Diet, low-residue diet. 4. The patient also instructed to take over-the-counter MiraLax     laxative to regulate her and to have at least one bowel movement a     day.     Clydia Llano, MD     ME/MEDQ  D:  08/17/2010  T:  08/18/2010  Job:  [id number]  cc:   Anselm Pancoast. [name], M.D. [address], [address] [place] [address]  Electronically Signed by Clydia Llano  on 08/27/2010 08:13:34 PM

## 2010-09-05 ENCOUNTER — Other Ambulatory Visit (HOSPITAL_COMMUNITY): Payer: Self-pay | Admitting: General Surgery

## 2010-09-05 DIAGNOSIS — K5792 Diverticulitis of intestine, part unspecified, without perforation or abscess without bleeding: Secondary | ICD-10-CM

## 2010-09-08 ENCOUNTER — Other Ambulatory Visit (HOSPITAL_COMMUNITY): Payer: Self-pay | Admitting: General Surgery

## 2010-09-08 ENCOUNTER — Ambulatory Visit (HOSPITAL_COMMUNITY)
Admission: RE | Admit: 2010-09-08 | Discharge: 2010-09-08 | Disposition: A | Discharge status: Home / Self Care | Payer: 59 | Source: Ambulatory Visit | Attending: General Surgery | Admitting: General Surgery

## 2010-09-08 DIAGNOSIS — K5792 Diverticulitis of intestine, part unspecified, without perforation or abscess without bleeding: Secondary | ICD-10-CM

## 2010-09-08 DIAGNOSIS — Z0389 Encounter for observation for other suspected diseases and conditions ruled out: Secondary | ICD-10-CM | POA: Insufficient documentation

## 2010-09-08 DIAGNOSIS — K5730 Diverticulosis of large intestine without perforation or abscess without bleeding: Secondary | ICD-10-CM | POA: Insufficient documentation

## 2010-09-08 MED ORDER — IOHEXOL 300 MG/ML  SOLN
600.0000 mL | Freq: Once | INTRAMUSCULAR | Status: AC | PRN
  Administered 2010-09-08: 600 mL via INTRAVENOUS

## 2010-09-10 ENCOUNTER — Encounter (INDEPENDENT_AMBULATORY_CARE_PROVIDER_SITE_OTHER): Payer: Self-pay | Admitting: General Surgery

## 2010-09-24 ENCOUNTER — Other Ambulatory Visit: Payer: Self-pay | Admitting: General Surgery

## 2010-09-24 DIAGNOSIS — R1032 Left lower quadrant pain: Secondary | ICD-10-CM

## 2010-09-25 ENCOUNTER — Ambulatory Visit
Admission: RE | Admit: 2010-09-25 | Discharge: 2010-09-25 | Disposition: A | Discharge status: Home / Self Care | Payer: 59 | Source: Ambulatory Visit | Attending: General Surgery | Admitting: General Surgery

## 2010-09-25 DIAGNOSIS — R1032 Left lower quadrant pain: Secondary | ICD-10-CM

## 2010-09-25 MED ORDER — IOHEXOL 300 MG/ML  SOLN
100.0000 mL | Freq: Once | INTRAMUSCULAR | Status: AC | PRN
  Administered 2010-09-25: 100 mL via INTRAVENOUS

## 2010-09-26 NOTE — Procedures (Signed)
[facility]  Patient:    [name], [name]                       MRN: [id number] Proc. Date: 08/31/00 Adm. Date:  [id number] Disc. Date: [id number] Attending:  Mingo Amber CC:         Lucky Cowboy, M.D.  Adolph Pollack, M.D.   Procedure Report  PROCEDURE PERFORMED:  Esophageal manometry.  ENDOSCOPIST:  Roosvelt Harps, M.D.  INDICATIONS FOR PROCEDURE:  The patient is a 60 year old female with significant reflux uncontrolled with proton pump inhibitors.  She recently had an upper endoscopy that did not demonstrate Barretts esophagitis and showed a small 4 to 5 cm hiatal hernia.  She has been somewhat noncompliant with medical therapy recommendations.  The lower esophageal sphincter was located manometrically at 38.5 cm from the nares.  The pressure was 29.9 mmHg which was normal.  There was 100% relaxation with swallows.  Peristalsis of the body of the esophagus was present and there was normal relaxation of the lower esophageal sphincter at the onset of the peristaltic wave.  Amplitude in the mid and distal esophagus was 38 mmHg and 75 mmHg respectively of the peristaltic wave which is good.  There were 40% nontransmitted contractions, however.  IMPRESSION:  Near normal peristalsis of the esophagus which should qualify her for fundoplication. DD:  09/06/00 TD:  09/06/00 Job: [id number]

## 2010-09-26 NOTE — Op Note (Signed)
[facility]  Patient:    [name], [name] Visit Number: [id number] MRN: [id number]          Service Type: SUR Location: 3W 4782 01 Attending Physician:  Andre Lefort Dictated by:   Sandria Bales. Ezzard Standing, M.D. Proc. Date: 11/21/01 Admit Date:  11/21/2001 Discharge Date: 11/22/2001   CC:         Lonie Peak, M.D., Forrest Moron, M.D. [facility]   Operative Report  DATE OF BIRTH:  [birth year]  PREOPERATIVE DIAGNOSIS:  Chronic cholecystitis, cholelithiasis.  POSTOPERATIVE DIAGNOSIS:  Chronic cholecystitis, cholelithiasis.  OPERATION:  Laparoscopic cholecystectomy with intraoperative cholangiogram.  SURGEON:  Sandria Bales. Ezzard Standing, M.D.  ASSISTANT:  Thornton Park. Daphine Deutscher, M.D.  ANESTHESIA:  General endotracheal anesthesia.  INDICATIONS:  Ms. Richoux is a 60 year old white female who is a patient of Dr. Lonie Peak of [place], who has had some vague GI symptoms and documented gallstones.  She has had a prior laparoscopic Nissen fundoplication by Dr. Abbey Chatters.  She also had a total hysterectomy and appendectomy.  She uses a BiPAP machine episodically for sleep apnea and is followed by Dr. Marcelyn Bruins.  She now comes for attempted laparoscopic cholecystectomy. DESCRIPTION OF PROCEDURE:  The patient was placed in supine position, given a general endotracheal anesthesia.  Her abdomen was prepped with Betadine solution and sterilely draped.  She was given 1 g of Ancef at the initiation of the procedure.  I went through her old scars in her umbilicus, subxiphoid, and two subcostal incisions and put the 10 mm Hasson trocar, secured this with a 0 Vicryl suture.  Through the umbilicus, I put a 10 mm scope, insufflated the abdomen, explored the abdomen.  Right and left lobes of the liver were unremarkable.  The stomach wall was unremarkable.  The lower abdomen was obscured from omentum which was attached to the anterior abdominal wall from  a lower abdominal incision from hysterectomy.  Again, the three trocars were placed, subxiphoid trocar, mid subcostal, and  5 mm lateral subcostal trocars.  The gallbladder was grasped, rotated cephalad.  It did have some fat around the gallbladder.  This was dissected off.  I got down to the gallbladder-cystic duct junction.  I placed two clips on the gallbladder side.  I had to divide the cystic artery before getting down to this location.  I then shot an intraoperative cholangiogram.  The intraoperative cholangiogram was shot using a cut off taut catheter inserted through a 14-gauge Jelco.  This taut catheter was secured with an Endoclip and then injected with about 6 cc of half-strength Hypaque solution. Under direct fluoroscopy, this showed free fluoroscopic contrast down the cystic duct into the common bile duct into the duodenum an The common bile  d back up to hepatic radicles.  There was no filling defect, no mass, and this was felt to be a normal intraoperative cholangiogram.  The taut catheter was then removed.  The three clips were placed across the cystic duct, and the cystic duct was then divided.   The gallbladder was divided from the liver bed using primarily hook Bovie electrocautery and dividing the gallbladder away up until I got to the top of the gallbladder at which time if reinspected the triangle of Calot and reinspected the bed of the gallbladder.  There was no bleeding or bile leak on any of these inspections. The gallbladder was then divided, placed in Endocatch bag, and then delivered through the umbilicus.  The umbilical port was  closed with a 0 Vicryl suture which was a pursestring which was there.  The other trocar sites were left alone.  The skin at each trocar site was closed with a 5-0 Monocryl and painted with tincture of benzoin and Steri-Strips and sterilely dressed.  The patient tolerated the procedure well and was transported to the recovery room  in good condition.  Sponge and needle counts were correct at the end of the case. Dictated by:   Sandria Bales. Ezzard Standing, M.D. Attending Physician:  Andre Lefort DD:  11/21/01 TD:  11/23/01 Job: [id number]

## 2010-09-26 NOTE — Discharge Summary (Signed)
NAME:  [name], [name]                          ACCOUNT NO.:  [id number]   MEDICAL RECORD NO.:  [id number]                   PATIENT TYPE:  INP   LOCATION:  5039                                 FACILITY:  [facility]   PHYSICIAN:  Dineen Kid. Reche Dixon, M.D.               DATE OF BIRTH:  [birth year]   DATE OF ADMISSION:  09/09/2002  DATE OF DISCHARGE:  09/13/2002                                 DISCHARGE SUMMARY   DISCHARGE DIAGNOSES:  1. Deep venous thrombosis felt to be secondary to immobility after cardiac     catheterization.  2. Hypertension, well-controlled.  3. Hyperlipidemia.  4. Depression.  5. Irritable bowel syndrome.  6. Diverticulosis.   DISCHARGE MEDICATIONS:  1. Toprol-XL 150 mg p.o. daily.  2. Lipitor 20 mg p.o. q.h.s.  3. Imdur 30 mg p.o. daily.  4. Hydrochlorothiazide 25 mg p.o. daily.  5. Celexa 20 mg p.o. daily.  6. Coumadin 5 mg p.o. q.h.s.  7. Lovenox 80 mg subcu injections q.12h., 10 a.m. and 10 p.m., until     Thursday evening.   FOLLOWUP:  The patient is to follow up with Dr. Anna Genre at [facility] on Wednesday, Sep 27, 2002, at 8:15 a.m.  The patient is to  follow up with the [facility] on Friday, Sep 15, 2002, at  10 a.m. to have PT and INR checked and results called to Dr. Oretha Ellis.  The patient is also to follow up at the [facility] for PT  and INR in the Coumadin Clinic on Sep 18, 2002.   BRIEF HOSPITAL COURSE:  The patient is a 60 year old female with past  medical history as stated above, who, until September 07, 2002, was in her usual  state of health, when she noticed that her leg was swollen.  The patient had  recently undergone a cardiac catheterization two weeks prior that had been  complicated briefly by a hematoma.  The patient also reports that three  weeks prior to this set of problems, [place]  The patient reports that on September 08, 2002, she had a  single  episode of sharp chest pain similar to her anginal equivalent.  She had no  shortness of breath and the pain resolved walking around and she felt that  she had a bee sting pain in her right leg and noted again that the leg was  swollen.  She had an intense coughing fit but noted no hemoptysis.  The  patient does have varicose veins, no family history of pulmonary embolism  nor DVT, no prior history of DVT in herself.  The patient underwent a deep  venous ultrasound that revealed an occlusive DVT in a small segment of the  distal common femoral and proximal femoral veins and a small occlusive  thrombosis of the calf vein just below  the knee in her right lower  extremity.  Chest x-ray was negative.  EKG showed a normal sinus rhythm with  what appears to be an S1 Q3 T3 pattern, however, the patient was not  hypoxic, non-tachycardic and with a negative D-dimer, D-dimer less than  0.22.  She was admitted to [facility] to begin anticoagulation,  when it was noted that she had a heme-positive rectal exam.   PHYSICAL EXAMINATION:  ADMISSION VITAL SIGNS:  Pulse 55, blood pressure  132/66, temperature 97.2, respirations 20, saturating 96% on room air.  GENERAL:  Obese female in no acute distress.  Physical exam was significant  for heme-positive rectal mucus and her right thigh which was more  erythematous, swollen and tender, compared to the left.   BRIEF HOSPITAL COURSE:  PROBLEM #1 - DEEP VENOUS THROMBOSIS:  The patient  was placed on heparin in case her GI bleed got worse and was started on  Coumadin with a goal INR of 2.0 to 3.0.  The patient's hemoglobin actually  climbed during her hospitalization and she had two bowel movements that were  both fecal occult-negative.  It is felt that the initial heme-positive  result may have been false or secondary to external hemorrhoids.  The  patient's INR hit 2.7 on Sep 13, 2002.  She expressed a clear desire to go  home and showed an  ability to deliver Lovenox injections to herself, so the  patient was sent home with the Lovenox arranged for this evening and a  prescription for doses on Thursday morning and Thursday evening.  She is  going to follow up in [facility] for PT/INR on Friday, Sep 15, 2002; at that point, we feel she will still be therapeutic on her  Coumadin and we will continue the dose at 5 mg p.o. q.h.s. and have her  start following up in the Coumadin Clinic.  If she is not therapeutic, she  will need more doses of Lovenox to provide coverage until she becomes  therapeutic.  Throughout the hospitalization, the patient's leg steadily  improved.  She experienced no cough, chest pain or hemoptysis.   PROBLEM #2 - HYPERTENSION:  This was very well-controlled during the  hospitalization on the patient's home medications and we will send her out  on this regimen.   PROBLEM #3 - CORONARY ARTERY DISEASE:  The patient had no problems from this  point of view during hospitalization.   PROBLEM #4 - DIVERTICULOSIS:  As mentioned above, the patient had an initial  heme-positive rectal exam but two fecal occult blood tests were thereafter  negative and it was felt that this problem was stable during the  hospitalization.   PROBLEM #5 - FOLLOWUP:  The patient is to follow up, as mentioned above,  with first trip to [facility] on Sep 15, 2002, then  Coumadin Clinic, Sep 18, 2002, then with Dr. Anna Genre of [facility] on Sep 27, 2002.     Oretha Ellis, M.D.                      Dineen Kid Reche Dixon, M.D.    BT/MEDQ  D:  09/13/2002  T:  09/15/2002  Job:  [id number]   cc:   W. Ashley Royalty., M.D.  [address], [address]  [place]  [place] [address]  Fax: [phone]   [facility] Dr. Silverio Decamp [facility]

## 2010-09-26 NOTE — Cardiovascular Report (Signed)
   NAME:  [name], [name]:  [id number]   MEDICAL RECORD NO.:  [id number]                   PATIENT TYPE:  OIB   LOCATION:  2856                                 FACILITY:  [facility]   PHYSICIAN:  W. Ashley Royalty., M.D.         DATE OF BIRTH:  January 18, 1951   DATE OF PROCEDURE:  08/24/2002  DATE OF DISCHARGE:                              CARDIAC CATHETERIZATION   HISTORY:  A 60 year old female with longstanding hypertension and  hyperlipidemia, who has had exertional chest discomfort.  She had a  Cardiolite scan showing possible anterior ischemia, and catheter was  advised.   COMMENTS ABOUT PROCEDURE:  The patient tolerated the procedure well without  complications.  She had good hemostasis and peripheral pulses following the  end of the procedure.   MEDICATIONS:  Versed 2 mg IV was used for sedation during the procedure.   HEMODYNAMIC DATA:  Aorta post contrast of 110/60, LV post contrast 110/5-12.   ANGIOGRAPHIC DATA:  Left Ventriculogram:  Performed in the 30-degree RAO  projection.  The aortic valve was normal.  The mitral valve was normal.  The  left ventricle appears normal in size.  The estimated ejection fraction is  60% to 65%.   Coronary Arteries:  Arises and distributes normally.  There is no  significant cardiac calcification noted.   Left Main Coronary Artery:  Normal.   Left Anterior Descending:  Extends to the apex.  The vessel appears free of  disease.   Circumflex Coronary Artery:  Has a small intermediate branch that appears to  have an ostial 60% stenosis.  The circumflex itself is normal with no  significant stenosis.   Right Coronary Artery:  Contains no significant stenosis.   IMPRESSION:  1. Probable moderate disease involving a small intermediate branch at the     ostium without any evidence of     obstructive atherosclerosis elsewhere.  2. Normal left ventricular function.   RECOMMENDATIONS:  Lose weight,  exercise, control blood pressure.                                               Darden Palmer., M.D.    WST/MEDQ  D:  08/24/2002  T:  08/24/2002  Job:  [id number]   cc:   Kizzie Furnish, M.D.  [address]  [place]  [place] [address]  Fax: [phone]

## 2010-09-26 NOTE — Op Note (Signed)
[facility]  Patient:    [name], [name]                       MRN: [id number] Proc. Date: 11/15/00 Adm. Date:  [id number] Attending:  Arlis Porta CC:         Lucky Cowboy, M.D.   Operative Report  PREOPERATIVE DIAGNOSES:  Hiatal hernia with gastroesophageal reflux disease.  POSTOPERATIVE DIAGNOSES:  Hiatal hernia with gastroesophageal reflux disease.  PROCEDURE:  Laparoscopic hiatal hernia repair and Nissen fundoplications.  SURGEON:  Adolph Pollack, M.D.  ASSISTANT:  Ollen Gross. Vernell Morgans, M.D.  ANESTHESIA:  General.  INDICATIONS:  This 60 year old female has had gastroesophageal reflux for a long time and is having some supraesophageal manifestations from it as well as persistent reflux despite medical therapy.  She now presents for operative management.  TECHNIQUE:  She was placed supine on the operating table and a general anesthetic was administered.  The abdomen was sterilely prepped and draped. Marcaine 0.5% was infiltrated just superior to the umbilicus, and a supraumbilical midline incision was made, incising the skin and subcutaneous tissue sharply.  A 1 cm incision was made in the fascia in the midline, and the peritoneal cavity was entered bluntly and under direct vision.  A pursestring suture of 0 Vicryl was placed around the fascial edges.  A Hasson trocar was introduced to the peritoneal cavity and pneumoperitoneum created by insufflation of CO2 gas.  Next the laparoscope was introduced.  Under direct vision, a 5 mm trocar was placed in the right lateral abdomen, and the small flexible liver retractor was introduced and used to retract the left lobe of the liver anteriorly, exposing the gastroesophageal junction.  A 5 mm trocar was then placed in the left upper quadrant, 5 mm trocar placed in the right upper quadrant, and an 11 mm trocar placed just to the right of the midline. The area of the gastrohepatic omentum was  identified and incised up to the right crus.  The pharyngoesophageal ligament was then incised over to the left crus.  I then bluntly created a plane between the right crus and the esophagus and started to create a retroesophageal window until I identified the left crus.  Next I approached the fundus of the stomach and grasped the fundus approximately midway down.  I then divided short gastric vessels all the way up around the cardia, completely mobilizing the fundus of the stomach.  I divided filmy attachments between the cardia, stomach, and the left crus.  Next I reapproached the retroesophageal window and was able to create that with adequate length.  I noticed a moderate size hiatal hernia.  All contents had been reduced.  A Penrose drain was placed at the GE junction, and the GE junction was retracted anteriorly.  I then repaired the hiatal hernia with two interrupted 0 nonabsorbable sutures.  Next I was able to pass the fundus through the retroesophageal window, bringing it to the right side esophagus. I then approximated part of the fundus, thus simulated the wrap.  A size 50 Bougie was passed without difficulty into the stomach.  A 360 degree fundoplication was then performed using size 0 nonabsorbable suture.  The first two sutures included the left leaf of the wrap, a small piece of esophagus and the right leaf of the wrap.  The last suture included the left and right leafs of the wrap.  The dilator was then removed, and the wrap was noted  to be floppy and under no tension.  It measured approximately 2 cm in length.  The posterior aspect of the right leaf was then anchored to the right crus with a 0 nonabsorbable suture.  The area was inspected, and hemostasis was adequate.  The sutures of the wrap lie at approximately 10 to the 11 oclock position on the esophagus.  I then removed all trocars under direct vision and released the pneumoperitoneum. The supraumbilical fascial  defect was closed by tightening up and tying down the pursestring suture.  The skin incisions were then closed with 4-0 Monocryl subcuticular stitches followed by Steri-Strips and sterile dressings.  She tolerated the procedure well without any apparent complications and was taken to the recovery room in satisfactory condition. DD:  11/15/00 TD:  11/15/00 Job: [id number]

## 2010-09-26 NOTE — H&P (Signed)
[facility]  Patient:    [name], [name]                       MRN: [id number] Adm. Date:  [id number] Attending:  Arlis Porta CC:         Lucky Cowboy, M.D.  Roosvelt Harps, M.D.  Corwin Levins, M.D. [facility]   History and Physical  PREOPERATIVE DIAGNOSIS:  Hiatal hernia with gastroesophageal reflux.  POSTOPERATIVE DIAGNOSIS:  Hiatal hernia with gastroesophageal reflux.  PROCEDURE:  Laparoscopic hiatal hernia repair and Nissen fundoplication.  SURGEON:  Adolph Pollack, M.D.  ASSISTANT:  Chevis Pretty, M.D.  ANESTHESIA:  General.  INDICATION:  Ms. Hoes is a 60 year old female who has a long history of gastroesophageal reflux.  She has initially been treated successfully medically, however, now is having breakthrough symptoms.  She reports some dysphagia but has no strictures on upper endoscopy.  A 24-hour pH study is strongly positive.  She does have some supraesophageal manifestations of reflux.  Manometry essentially demonstrates normal esophageal peristalsis. She is admitted for elective operation.  PAST MEDICAL HISTORY: 1. Gastroesophageal reflux disease with a hiatal hernia. 2. Hypertension. 3. Paroxysmal supraventricular tachycardia. 4. Sleep apnea. 5. Irritable bowel syndrome. 6. Diverticulosis. 7. Arthritis. 8. Depression.  OPERATIONS OPERATIONS:  Partial hysterectomy; completion of hysterectomy; appendectomy.  ALLERGIES:  PENICILLIN.  CURRENT MEDICATIONS: 1. Celexa 20 mg a day. 2. Celebrex 200 mg a day. 3. Toprol-XL 100 mg a day. 4. Premarin 0.625 mg a day. 5. Aciphex 20 mg q.d.  SOCIAL HISTORY:  Denies tobacco or alcohol use.  FAMILY HISTORY:  Strongly positive for hypertension and heart disease.  REVIEW OF SYSTEMS:  Remarkable for some difficulty walking secondary to her arthritis.  She does have some hemorrhoids as well.  She uses a CPAP machine.  PHYSICAL EXAMINATION:  GENERAL:  A slightly obese female in  no acute distress, pleasant and cooperative.  VITAL SIGNS:  Afebrile with blood pressure 140/90, pulse of 50.  Weight 176 pounds, height 5-feet 2-inches tall.  EYES:  Extraocular motions intact and her sclerae are clear.  NECK:  Supple without masses.  CARDIOVASCULAR:  Heart demonstrates a slightly decreased rate with irregular rhythm.  RESPIRATORY:  Breath sounds are equal and clear and respirations are nonlabored.  ABDOMEN:  Soft, nontender, nondistended.  There is a lower midline scar and a right lower quadrant scar present.  No organomegaly present.  MUSCULOSKELETAL:  She has full range of motion of extremities.  No cyanosis or edema.  IMPRESSION:  Hiatal hernia with gastroesophageal reflux that is becoming refractory to medical care; she also has supraesophageal manifestations of the reflux.  PLAN:  Laparoscopic hiatal hernia repair with Nissen fundoplication.  The procedure and risks have been explained to her preoperatively; she seemed to understand these and agrees to proceed. DD:  11/15/00 TD:  11/15/00 Job: [id number]

## 2010-10-15 ENCOUNTER — Other Ambulatory Visit: Payer: Self-pay | Admitting: General Surgery

## 2010-10-15 ENCOUNTER — Encounter (HOSPITAL_COMMUNITY): Payer: 59

## 2010-10-15 LAB — COMPREHENSIVE METABOLIC PANEL
ALT: 21 U/L (ref 0–35)
AST: 20 U/L (ref 0–37)
Albumin: 3.8 g/dL (ref 3.5–5.2)
Alkaline Phosphatase: 98 U/L (ref 39–117)
BUN: 5 mg/dL — ABNORMAL LOW (ref 6–23)
CO2: 27 mEq/L (ref 19–32)
Calcium: 9.8 mg/dL (ref 8.4–10.5)
Chloride: 103 mEq/L (ref 96–112)
Creatinine, Ser: 0.62 mg/dL (ref 0.4–1.2)
GFR calc Af Amer: >60 mL/min (ref >60)
GFR calc non Af Amer: >60 mL/min (ref >60)
Glucose, Bld: 91 mg/dL (ref 70–99)
Potassium: 4.1 mEq/L (ref 3.5–5.1)
Sodium: 141 mEq/L (ref 135–145)
Total Bilirubin: 0.4 mg/dL (ref 0.3–1.2)
Total Protein: 7.4 g/dL (ref 6.0–8.3)

## 2010-10-15 LAB — CBC
HCT: 39.4 % (ref 36.0–46.0)
Hemoglobin: 13 g/dL (ref 12.0–15.0)
MCH: 28.6 pg (ref 26.0–34.0)
MCHC: 33 g/dL (ref 30.0–36.0)
MCV: 86.6 fL (ref 78.0–100.0)
Platelets: 300 10*3/uL (ref 150–400)
RBC: 4.55 MIL/uL (ref 3.87–5.11)
RDW: 14.4 % (ref 11.5–15.5)
WBC: 9.4 10*3/uL (ref 4.0–10.5)

## 2010-10-15 LAB — DIFFERENTIAL
Basophils Absolute: 0.1 10*3/uL (ref 0.0–0.1)
Basophils Relative: 1 % (ref 0–1)
Eosinophils Absolute: 0.3 10*3/uL (ref 0.0–0.7)
Eosinophils Relative: 3 % (ref 0–5)
Lymphocytes Relative: 42 % (ref 12–46)
Lymphs Abs: 3.9 10*3/uL (ref 0.7–4.0)
Monocytes Absolute: 0.7 10*3/uL (ref 0.1–1.0)
Monocytes Relative: 7 % (ref 3–12)
Neutro Abs: 4.5 10*3/uL (ref 1.7–7.7)
Neutrophils Relative %: 48 % (ref 43–77)

## 2010-10-15 LAB — SURGICAL PCR SCREEN
MRSA, PCR: NEGATIVE
Staphylococcus aureus: NEGATIVE

## 2010-10-15 LAB — ABO/RH: ABO/RH(D): A POS

## 2010-10-21 ENCOUNTER — Other Ambulatory Visit: Payer: Self-pay | Admitting: General Surgery

## 2010-10-21 ENCOUNTER — Inpatient Hospital Stay (HOSPITAL_COMMUNITY): Payer: 59

## 2010-10-21 ENCOUNTER — Inpatient Hospital Stay (HOSPITAL_COMMUNITY)
Admission: RE | Admit: 2010-10-21 | Discharge: 2010-10-29 | DRG: 330 | Disposition: A | Discharge status: Home / Self Care | Payer: 59 | Source: Ambulatory Visit | Attending: General Surgery | Admitting: General Surgery

## 2010-10-21 DIAGNOSIS — K5732 Diverticulitis of large intestine without perforation or abscess without bleeding: Principal | ICD-10-CM | POA: Diagnosis present

## 2010-10-21 DIAGNOSIS — I10 Essential (primary) hypertension: Secondary | ICD-10-CM | POA: Diagnosis present

## 2010-10-21 DIAGNOSIS — Z01812 Encounter for preprocedural laboratory examination: Secondary | ICD-10-CM

## 2010-10-21 DIAGNOSIS — R34 Anuria and oliguria: Secondary | ICD-10-CM | POA: Diagnosis not present

## 2010-10-21 DIAGNOSIS — N829 Female genital tract fistula, unspecified: Secondary | ICD-10-CM | POA: Diagnosis present

## 2010-10-21 HISTORY — PX: OTHER SURGICAL HISTORY: SHX169

## 2010-10-21 LAB — TYPE AND SCREEN
ABO/RH(D): A POS
Antibody Screen: NEGATIVE

## 2010-10-22 LAB — CBC
HCT: 32.1 % — ABNORMAL LOW (ref 36.0–46.0)
Hemoglobin: 10.7 g/dL — ABNORMAL LOW (ref 12.0–15.0)
MCH: 29.5 pg (ref 26.0–34.0)
MCHC: 33.3 g/dL (ref 30.0–36.0)
MCV: 88.4 fL (ref 78.0–100.0)
Platelets: 222 10*3/uL (ref 150–400)
RBC: 3.63 MIL/uL — ABNORMAL LOW (ref 3.87–5.11)
RDW: 14.7 % (ref 11.5–15.5)
WBC: 11 10*3/uL — ABNORMAL HIGH (ref 4.0–10.5)

## 2010-10-22 LAB — BASIC METABOLIC PANEL
BUN: 7 mg/dL (ref 6–23)
CO2: 30 mEq/L (ref 19–32)
Calcium: 8.7 mg/dL (ref 8.4–10.5)
Chloride: 103 mEq/L (ref 96–112)
Creatinine, Ser: 0.61 mg/dL (ref 0.4–1.2)
GFR calc Af Amer: >60 mL/min (ref >60)
GFR calc non Af Amer: >60 mL/min (ref >60)
Glucose, Bld: 117 mg/dL — ABNORMAL HIGH (ref 70–99)
Potassium: 4.3 mEq/L (ref 3.5–5.1)
Sodium: 138 mEq/L (ref 135–145)

## 2010-10-23 LAB — CBC
HCT: 31.2 % — ABNORMAL LOW (ref 36.0–46.0)
Hemoglobin: 10.3 g/dL — ABNORMAL LOW (ref 12.0–15.0)
MCH: 29.3 pg (ref 26.0–34.0)
MCHC: 33 g/dL (ref 30.0–36.0)
MCV: 88.6 fL (ref 78.0–100.0)
Platelets: 213 10*3/uL (ref 150–400)
RBC: 3.52 MIL/uL — ABNORMAL LOW (ref 3.87–5.11)
RDW: 15 % (ref 11.5–15.5)
WBC: 12.5 10*3/uL — ABNORMAL HIGH (ref 4.0–10.5)

## 2010-10-25 LAB — BASIC METABOLIC PANEL
BUN: 4 mg/dL — ABNORMAL LOW (ref 6–23)
CO2: 31 mEq/L (ref 19–32)
Calcium: 9.3 mg/dL (ref 8.4–10.5)
Chloride: 100 mEq/L (ref 96–112)
Creatinine, Ser: <0.47 mg/dL — ABNORMAL LOW (ref 0.50–1.10)
Glucose, Bld: 101 mg/dL — ABNORMAL HIGH (ref 70–99)
Potassium: 3.9 mEq/L (ref 3.5–5.1)
Sodium: 135 mEq/L (ref 135–145)

## 2010-10-25 LAB — CBC
HCT: 31 % — ABNORMAL LOW (ref 36.0–46.0)
Hemoglobin: 10.3 g/dL — ABNORMAL LOW (ref 12.0–15.0)
MCH: 29.1 pg (ref 26.0–34.0)
MCHC: 33.2 g/dL (ref 30.0–36.0)
MCV: 87.6 fL (ref 78.0–100.0)
Platelets: 262 10*3/uL (ref 150–400)
RBC: 3.54 MIL/uL — ABNORMAL LOW (ref 3.87–5.11)
RDW: 14.7 % (ref 11.5–15.5)
WBC: 10.2 10*3/uL (ref 4.0–10.5)

## 2010-10-28 LAB — CBC
HCT: 31.2 % — ABNORMAL LOW (ref 36.0–46.0)
Hemoglobin: 10.5 g/dL — ABNORMAL LOW (ref 12.0–15.0)
MCH: 28.8 pg (ref 26.0–34.0)
MCHC: 33.7 g/dL (ref 30.0–36.0)
MCV: 85.7 fL (ref 78.0–100.0)
Platelets: 341 10*3/uL (ref 150–400)
RBC: 3.64 MIL/uL — ABNORMAL LOW (ref 3.87–5.11)
RDW: 14.7 % (ref 11.5–15.5)
WBC: 11.1 10*3/uL — ABNORMAL HIGH (ref 4.0–10.5)

## 2010-10-29 LAB — CBC
HCT: 30.5 % — ABNORMAL LOW (ref 36.0–46.0)
Hemoglobin: 9.7 g/dL — ABNORMAL LOW (ref 12.0–15.0)
MCH: 27.7 pg (ref 26.0–34.0)
MCHC: 31.8 g/dL (ref 30.0–36.0)
MCV: 87.1 fL (ref 78.0–100.0)
Platelets: 339 10*3/uL (ref 150–400)
RBC: 3.5 MIL/uL — ABNORMAL LOW (ref 3.87–5.11)
RDW: 14.9 % (ref 11.5–15.5)
WBC: 12.1 10*3/uL — ABNORMAL HIGH (ref 4.0–10.5)

## 2010-11-05 ENCOUNTER — Ambulatory Visit (INDEPENDENT_AMBULATORY_CARE_PROVIDER_SITE_OTHER): Payer: 59

## 2010-11-05 NOTE — Progress Notes (Signed)
Patient arrived for post op abdominal staple removal after sigmoid colectomy on 10/21/2010 as ordered by Dr. [name]. Wound in tack, no signs of infection, no drainage. A scab was noted at the top of the incision.   Which was Dr. Jamey Ripa assessed and reported as normal.  Patient tolerated the procedure well.  She was told to report any signs of infection, or drainage to the office.  Also to continue to decrease activity until next office visit with Dr. [name].

## 2010-11-09 NOTE — Op Note (Signed)
  [name], [name]:  [id number]  MEDICAL RECORD NO.:  [id number]  LOCATION:  0009                         FACILITY:  [facility]  PHYSICIAN:  Excell Seltzer. Annabell Howells, M.D.    DATE OF BIRTH:  [birth year]  DATE OF PROCEDURE:  10/21/2010 DATE OF DISCHARGE:                              OPERATIVE REPORT   PATIENT OF:  Anselm Pancoast. [name], M.D.  PROCEDURE:  Cystoscopy, insertion of bilateral ureteral catheters.  PREOPERATIVE DIAGNOSIS:  Diverticulitis.  POSTOPERATIVE DIAGNOSIS:  Diverticulitis.  SURGEON:  Excell Seltzer. Annabell Howells, M.D.  ANESTHESIA:  General.  SPECIMENS:  None.  DRAINS:  Bilateral 5-French open-end ureteral catheters and 16-French Foley.  COMPLICATIONS:  None.  INDICATIONS:  [name] is a 60 year old white female with a history of severe diverticulitis with prior abscess, who is to undergo an abdominoperineal resection.  There is concern about ureteral identification, so I was asked to place ureteral catheters.  FINDINGS AND PROCEDURES:  She was taken to the operating room where general anesthetic was induced.  She was given antibiotics as ordered per Dr. [name].  She was placed in lithotomy position.  Her perineum and genitalia were prepped with Betadine solution.  She was draped in usual sterile fashion.  Time-out was performed.  Cystoscopy was performed using a 22-French scope and 12-degree lens examination revealed a normal urethra.  Ureteral orifices were unremarkable.  The bladder wall had mild trabeculation with no tumor, stones or inflammation noted.  The right ureteral orifice was cannulated with 5-French open-end catheter which was advanced to the kidney under fluoroscopic guidance without difficulty.  This was then repeated on the left side, once again without difficulty.  At this point, the bladder was drained.  The cystoscope was removed. The stents were left exiting urethra.  16-French Foley catheter was inserted.  Balloon was  filled with 10 mL sterile fluid.  The stents were secured to the catheter with silk tie and placed to drainage using a Layla Barter device.  At this point, the procedure was turned over to Dr. [name].  There were no complications during my portion.     Excell Seltzer. Annabell Howells, M.D.     JJW/MEDQ  D:  10/21/2010  T:  10/21/2010  Job:  [id number]  Electronically Signed by Bjorn Pippin M.D. on 11/09/2010 12:08:29 PM

## 2010-11-10 ENCOUNTER — Telehealth (INDEPENDENT_AMBULATORY_CARE_PROVIDER_SITE_OTHER): Payer: Self-pay

## 2010-11-10 NOTE — Telephone Encounter (Signed)
Patient called in after hours and Dr Ezzard Standing was paged.  I answered the page since we were in clinic still.  Patient states she just had her staples removed Wednesday after her colon surgery by Dr [name].  There was some dried blood at the umbilicus that came off and then she saw some yellow material.  She has no fever and no redness.  I addressed this with Dr Ezzard Standing who says this is ok and to wash the area with soap and water and keep clean and dry.  I notified the patient and she will call Monday if there are still concerns.

## 2010-11-11 NOTE — Telephone Encounter (Signed)
11/11/2010 p

## 2010-11-18 NOTE — Op Note (Signed)
NAMELACONYA, CLERE NO.:  [id number]  MEDICAL RECORD NO.:  [id number]  LOCATION:  0009                         FACILITY:  [facility]  PHYSICIAN:  Anselm Pancoast. 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  OPERATION:  Sigmoid colectomy with low pelvic anastomosis and excision of fistula.  Dr. Annabell Howells placed stents preoperatively.  HISTORY:  [name] is a 60 year old Caucasian female who I saw approximately 2 months ago when she was admitted by, I think Dr. Arlyce Dice with pelvic pain, diverticulitis.  CT showed a pressure on the left side of the bladder.  The CT did not show a frank abscess but the patient had had some stool like drainage from the vagina.  When I saw her, she was improving.  She had been hospitalized for several days, was on antibiotics, and I could not see an actual fistula going into the vagina.  She has had a previous hysterectomy years ago and I recommended that we continue on with the antibiotics.  She was switched to oral antibiotics when I saw her in the office and a vaginal exam and a limited sigmoidoscopy, actually you could see the diverticulosis on the CT and etc., pretty low, could not see any type of opening into the vagina and she was no longer having any drainage through vagina.  She continued her, about 3 weeks of antibiotics and we waited about 2 weeks and then got a full-column barium enema or Gastrografin enema, there was no fistula noted going to the vagina.  The full column barium enema really did not show that much of a definite stricture or anything and we wondered if we could basically not plan on doing a sigmoid colectomy, however, shortly after which she started having pain again, and Dr. Jamey Ripa placed her on Cipro and Flagyl, and he did not  actually examine her but she called in and he read her records and I saw her in the office about a week later, at which time she was certainly not as tender as she was when she was in the hospital.  We continued her on the antibiotics.  She has had a lifelong history of chronic constipation and chronic recurrent episodes of diverticulitis, and whether she was going to be able to manage this with bowel management and laxatives, I am not sure.  She said every time she stopped the antibiotics she would end up having more symptoms and want to proceed on with sigmoid colectomy. Arrangements were made by Dr. Annabell Howells who put stents in her at the time of surgery and she was scheduled for surgery today.  She did do a mechanical and oral antibiotic prep in preparation for the surgery.  She said she had to call the office 4 times yesterday, but was able to complete the prep.  She says she is having, nothing but liquid stools, no solid components afterwards.  She was here this morning.  Laboratory studies were done last week, nothing was repeated, and her white count  was normal then, as was her hemoglobin and hematocrit.  She was given 2 g of Mefoxin and taken back to the operative suite.  Induction of general anesthesia, she was up in the yellow fin stirrups, and Dr. Annabell Howells placed stents, both the left and right ureter, said it went in very easily, and he could see anything pushing off the bladder.  I repositioned her slightly so she was down on the table just a little lower so we get to the rectum and then we prepped her abdomen and peritoneum.  The peritoneum was prepped with Betadine.  The abdomen was prepped with the alcohol prep.  She was then draped with the leggings and towel up under her buttocks and then a lap on the abdomen.  The patient has had a lot of previous surgery, her gallbladder.  She has had a hysterectomy and appendectomy years ago, and her lower midline incision was opened up.   There was some old, I am not sure, whether a PDS or Tycron suture from her hysterectomy, and then the abdomen incision was opened up basically to the umbilicus.  She has got a lot of adhesions up in the inside with the omentum adherent to the undersurface of the abdominal wall.  This was kind of carefully taken down and then we could see the sigmoid colon where there were adhesions where she has had previous bilateral salpingo oophorectomy.  We freed up the sigmoid colon and you could feel a hard lump in the distal portion of the sigmoid and this was adherent to the vagina cuff and the bladder, but we kind of carefully freed it up and then I elected to go ahead and transect the sigmoid colon probably about 6 inches below the proximal splenic flexure and then the mesentery to the sigmoid colon was divided between [place] and right angles and the pedicles were tied with either 2- 0 silk or 2-0 Vicryl, the smaller ones, and then we were down actual to the sacral promontory.  You could feel the left ureter, it was more wider than where the inflammation is and then we were able to free up this along the area and go below it about 2 inches and then we went down in the upper rectum where the bowel itself looked unremarkable.  I used a contour with the thick staples to go across the proximal rectal distal sigmoid and then removed the specimen from the field.  When we were dissecting the area we thought we could visualize where the little fistula went to the vagina.  I did not put any stitches, and later when I went down below to pull the staple I put a sucker up in the area, and you could see that this is the vaginal cuff area but there was not actual hole that we could identify now.  We fixed the EEA with a 2-0 Prolene using the pursestring maker and it made a good cuff and we slipped it so it would fit a little bit tight and then I opened the peritoneal flexure laterally which gave [place] good other  entry to where it sits comfortably.  Next, I then went below, and as I noted, at first I put the sucker up the corner of the vagina, and Dr. Gerrit Friends said he could see the area where the little, what looked like a previous chronic abscesses had been.  I then put the proctoscope in the rectum and inflated it, there was no leak, and it goes  up, and at really about 12 cm you could see where we were out of the rectum going into the distal sigmoid area.  I then used the 29 stealth EEA, slipped it on in place and the suture line was really across over the other suture line and it brought down so that the stapler was in good position.  The little ring line  is about 25% of the maximum closure width and then it was fired.  We had very little contamination from the colon.  We had a little intestine partially occluding the proximal sigmoid and then I did a procto, and you could see the anastomosis wide open, it was not bleeding.  I did not go across it but the anastomosis is probably about 12 cm.  Next, there was no leakage.  When we inflated the bowel, Dr. Gerrit Friends was at the top and there was not any mesenteric defect that needed closure, on the way we had pulled it down.  I then placed the small bowel in the anatomic position, the omentum which was fairly generous, we placed over it.  No drains were placed and then the fascia was closed with 0 looped PDS with interrupted 0 Prolene, and then the skin was closed with staples.  The patient tolerated the procedure nicely.  I removed the stent, but left the Foley catheter in, and I will keep her on antibiotics for 3 more days since she has had the previous pelvic abscess.     Anselm Pancoast. [name], M.D.     WJW/MEDQ  D:  10/21/2010  T:  10/21/2010  Job:  [id number]  cc:   Barbette Hair. Arlyce Dice, MD,FACG [address] [place] [address]  Electronically Signed by Consuello Bossier M.D. on 11/18/2010 03:47:51 PM

## 2010-11-18 NOTE — Discharge Summary (Signed)
NAMEJAIDE, [name]:  [id number]  MEDICAL RECORD NO.:  [id number]  LOCATION:  1533                         FACILITY:  [facility]  PHYSICIAN:  Anselm Pancoast. Nikolos Billig, M.D.DATE OF BIRTH:  1950-06-19  DATE OF ADMISSION:  10/21/2010 DATE OF DISCHARGE:  10/29/2010                              DISCHARGE SUMMARY   DISCHARGE DIAGNOSES: 1. 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 2. History of recurrent sigmoid diverticulitis. 3. History of mild hypertension.  Operation was sigmoid colectomy with low pelvic anastomosis, general anesthesia.  HISTORY:  [name] is a 60 year old female, who approximately 2 months ago, when I was L day, was admitted by Dr. Arlyce Dice with pelvic pain, left diverticulitis.  The CT showed pressure impinging on the left lower aspect of the bladder and the patient stated that she had been having stool like drainage from her vagina.  At that time, she was improved on antibiotics.  It was not an abscess that could be drained and she was improving and was discharged on antibiotics.  I saw her in the office on examination with a procto and pelvic, I could not identify a fistula to the vagina and her symptoms appeared to subside; however, after about 2 weeks off the antibiotics, we got a barium enema and we could not see any evidence of a fistula and really the colon did not look all that bad.  I was hopeful that we could kind a treat her vigorously with laxatives and stool regulation and not have to do a colectomy, but she started having symptoms.  About 2 weeks later, Dr. Jamey Ripa put her back on Cipro and Flagyl.  When I saw her, I thought that since she had failed conservative management that we needed to proceed on with a sigmoid colectomy and this time was picked for her surgery. She has not been on antibiotics right up to the time of surgery, but is having kind of reoccurring cramping frequency and frequent urination  at night.  She has had no further drainage from her vagina.  She was taken to surgery and a sigmoid colectomy was performed.  The patient was noted to have a very dilated bladder at the time of her surgery, and we had Dr. Annabell Howells place stents immediately before the surgery.  I assisted, at the surgery was Dr. Gerrit Friends, and we did a low stapled EEA anastomosis at approximately 11 cm.  Postoperatively, on the first day, the nurse removed the Foley, she was not able to void and I reinserted the Foley after she became distended and then left the Foley 2 days.  I kept her on antibiotics, Mefoxin for approximately 3 days; since she did not have a frank abscess, but there have been this area down real low on the left lower quadrant where obviously the abscess had been that had drained into vagina.  We could see the area in the colon, but we put in a probe or hemostat into the vagina, could not actually see an opening into the vagina,  no sutures were placed on the vagina.  Postoperatively, she started passing a little flatus on about the fourth day.  NG tube was removed.  We then, kind of advanced her diet slowly and she is on full liquids, which she is tolerating at this time.  She is now off antibiotics for about 3 or 4 days.  Her white count, which was slightly elevated immediately after surgery, came back to normal, is now 12,000. She is afebrile, had 1 temperature of 100 at 9 p.m. yesterday, when she said she felt very well.  Her bladder has not been chronically dilated from we thought, kind of episodes of altered bladder scanner.  I have removed every other staple, placed Steri-Strips, and she is to be discharged, continuing on her regular antihypertensive medication and has Vicodin for pain.  I am going to give her prescription for Cipro in case she would have pain or started having fever, that we will check a urine culture if there is any question, but since she did have an abscess recently,  but not an actual abscess at the time of the surgery. The path report showed sigmoid diverticulitis.  They could see the area where there had been a fistula and there was a big thickened area of the colon right above the peritoneal reflection where this fistula had formed.  We did the anastomosis about 3-4 cm below that and hopefully will not have further problems with recurrent sigmoid diverticulitis.     Anselm Pancoast. [name], M.D.     WJW/MEDQ  D:  10/29/2010  T:  10/29/2010  Job:  [id number]  cc:   Dr. Arlyce Dice  Electronically Signed by Consuello Bossier M.D. on 11/18/2010 03:47:43 PM

## 2010-11-20 ENCOUNTER — Encounter (INDEPENDENT_AMBULATORY_CARE_PROVIDER_SITE_OTHER): Payer: Self-pay | Admitting: General Surgery

## 2010-11-20 ENCOUNTER — Encounter (INDEPENDENT_AMBULATORY_CARE_PROVIDER_SITE_OTHER): Payer: Self-pay

## 2010-11-20 ENCOUNTER — Ambulatory Visit (INDEPENDENT_AMBULATORY_CARE_PROVIDER_SITE_OTHER): Payer: 59 | Admitting: General Surgery

## 2010-11-20 VITALS — BP 132/80 | HR 76 | Temp 97.3°F | Ht 63.0 in | Wt 156.2 lb

## 2010-11-20 DIAGNOSIS — K5720 Diverticulitis of large intestine with perforation and abscess without bleeding: Secondary | ICD-10-CM

## 2010-11-20 DIAGNOSIS — K5732 Diverticulitis of large intestine without perforation or abscess without bleeding: Secondary | ICD-10-CM

## 2010-11-20 NOTE — Patient Instructions (Addendum)
Continued taken the MiraLax and keeping her bowels are regular may advance to select diet see me for final postop is the approximate 6 weeks  Ms. Vecchio can return to work on 24 July with no restrictions.

## 2010-11-24 NOTE — Progress Notes (Signed)
Subjective:     Patient ID: [name], female   DOB: 1950-09-15, 60 y.o.   MRN: [id number]  HPIThe patient returns now approximately 3 weeks following a sigmoid colectomy for a sigmoid diverticulitis with abscess the drain through her vagina  Review of Systems     Objective:   Physical ExamThe patient's midline incision is well healed there is no evidence of any drainage or inflammation and rectal examination with finger portion where you can feel the anastomosis is no evidence of any inflammation or definite tenderness she is now approximately 4 weeks after her surgery and plans on returning to work in one week with no restrictions     Assessment:        Plan:         Phe understands that if shor acute abdominal symptoms to callnd be seen soo.atient has resumed a sehe was taken MiraLax on a regular basis and will return to p visit and approximate 6 weeks

## 2011-01-01 ENCOUNTER — Encounter (INDEPENDENT_AMBULATORY_CARE_PROVIDER_SITE_OTHER): Payer: Self-pay | Admitting: General Surgery

## 2011-01-02 ENCOUNTER — Encounter (INDEPENDENT_AMBULATORY_CARE_PROVIDER_SITE_OTHER): Payer: 59 | Admitting: General Surgery

## 2011-01-28 ENCOUNTER — Encounter (INDEPENDENT_AMBULATORY_CARE_PROVIDER_SITE_OTHER): Payer: Self-pay | Admitting: General Surgery

## 2011-01-29 ENCOUNTER — Encounter (INDEPENDENT_AMBULATORY_CARE_PROVIDER_SITE_OTHER): Payer: Self-pay | Admitting: General Surgery

## 2011-01-30 ENCOUNTER — Ambulatory Visit (INDEPENDENT_AMBULATORY_CARE_PROVIDER_SITE_OTHER): Payer: 59 | Admitting: General Surgery

## 2011-02-16 ENCOUNTER — Encounter (INDEPENDENT_AMBULATORY_CARE_PROVIDER_SITE_OTHER): Payer: Self-pay | Admitting: General Surgery

## 2011-04-28 NOTE — Progress Notes (Signed)
Visit nurse only-

## 2012-03-15 ENCOUNTER — Other Ambulatory Visit: Payer: Self-pay | Admitting: Family Medicine

## 2012-03-15 DIAGNOSIS — Z1231 Encounter for screening mammogram for malignant neoplasm of breast: Secondary | ICD-10-CM

## 2012-03-24 ENCOUNTER — Ambulatory Visit
Admission: RE | Admit: 2012-03-24 | Discharge: 2012-03-24 | Disposition: A | Discharge status: Home / Self Care | Payer: 59 | Source: Ambulatory Visit | Attending: Family Medicine | Admitting: Family Medicine

## 2012-03-24 DIAGNOSIS — Z1231 Encounter for screening mammogram for malignant neoplasm of breast: Secondary | ICD-10-CM

## 2013-04-03 ENCOUNTER — Other Ambulatory Visit: Payer: Self-pay

## 2013-04-03 DIAGNOSIS — Z1231 Encounter for screening mammogram for malignant neoplasm of breast: Secondary | ICD-10-CM

## 2013-04-18 ENCOUNTER — Ambulatory Visit
Admission: RE | Admit: 2013-04-18 | Discharge: 2013-04-18 | Disposition: A | Discharge status: Home / Self Care | Payer: No Typology Code available for payment source | Source: Ambulatory Visit

## 2013-04-18 DIAGNOSIS — Z1231 Encounter for screening mammogram for malignant neoplasm of breast: Secondary | ICD-10-CM

## 2013-10-12 ENCOUNTER — Ambulatory Visit: Payer: Self-pay | Admitting: Internal Medicine

## 2013-11-21 ENCOUNTER — Observation Stay: Payer: Self-pay | Admitting: Internal Medicine

## 2013-11-21 LAB — COMPREHENSIVE METABOLIC PANEL
Albumin: 3.8 g/dL (ref 3.4–5.0)
Alkaline Phosphatase: 88 U/L
Anion Gap: 9 (ref 7–16)
BUN: 9 mg/dL (ref 7–18)
Bilirubin,Total: 0.8 mg/dL (ref 0.2–1.0)
Calcium, Total: 9 mg/dL (ref 8.5–10.1)
Chloride: 101 mmol/L (ref 98–107)
Co2: 26 mmol/L (ref 21–32)
Creatinine: 0.8 mg/dL (ref 0.60–1.30)
EGFR (African American): >60
EGFR (Non-African Amer.): >60
Glucose: 98 mg/dL (ref 65–99)
Osmolality: 271 (ref 275–301)
Potassium: 3 mmol/L — ABNORMAL LOW (ref 3.5–5.1)
SGOT(AST): 61 U/L — ABNORMAL HIGH (ref 15–37)
SGPT (ALT): 57 U/L (ref 12–78)
Sodium: 136 mmol/L (ref 136–145)
Total Protein: 7.3 g/dL (ref 6.4–8.2)

## 2013-11-21 LAB — CK-MB: CK-MB: <0.5 ng/mL — ABNORMAL LOW (ref 0.5–3.6)

## 2013-11-21 LAB — CBC
HCT: 39.7 % (ref 35.0–47.0)
HGB: 13.4 g/dL (ref 12.0–16.0)
MCH: 30.6 pg (ref 26.0–34.0)
MCHC: 33.8 g/dL (ref 32.0–36.0)
MCV: 91 fL (ref 80–100)
Platelet: 253 10*3/uL (ref 150–440)
RBC: 4.38 10*6/uL (ref 3.80–5.20)
RDW: 14.6 % — ABNORMAL HIGH (ref 11.5–14.5)
WBC: 16.2 10*3/uL — ABNORMAL HIGH (ref 3.6–11.0)

## 2013-11-21 LAB — DIFFERENTIAL
Basophil #: 0.1 10*3/uL (ref 0.0–0.1)
Basophil %: 0.4 %
Eosinophil #: 0.2 10*3/uL (ref 0.0–0.7)
Eosinophil %: 1.5 %
Lymphocyte #: 2.8 10*3/uL (ref 1.0–3.6)
Lymphocyte %: 17.6 %
Monocyte #: 1.3 x10 3/mm — ABNORMAL HIGH (ref 0.2–0.9)
Monocyte %: 7.8 %
Neutrophil #: 11.8 10*3/uL — ABNORMAL HIGH (ref 1.4–6.5)
Neutrophil %: 72.7 %

## 2013-11-21 LAB — TROPONIN I
Troponin-I: <0.02 ng/mL
Troponin-I: <0.02 ng/mL

## 2013-11-21 LAB — CK TOTAL AND CKMB (NOT AT ARMC)
CK, Total: 34 U/L
CK-MB: <0.5 ng/mL — ABNORMAL LOW (ref 0.5–3.6)

## 2013-11-21 LAB — PROTIME-INR
INR: 1
Prothrombin Time: 12.9 secs (ref 11.5–14.7)

## 2013-11-21 LAB — APTT: Activated PTT: 31.2 secs (ref 23.6–35.9)

## 2013-11-22 LAB — CBC WITH DIFFERENTIAL/PLATELET
Basophil #: 0.1 10*3/uL (ref 0.0–0.1)
Basophil %: 0.6 %
Eosinophil #: 0.2 10*3/uL (ref 0.0–0.7)
Eosinophil %: 1.1 %
HCT: 38.1 % (ref 35.0–47.0)
HGB: 12.8 g/dL (ref 12.0–16.0)
Lymphocyte #: 2.6 10*3/uL (ref 1.0–3.6)
Lymphocyte %: 14.3 %
MCH: 30.9 pg (ref 26.0–34.0)
MCHC: 33.6 g/dL (ref 32.0–36.0)
MCV: 92 fL (ref 80–100)
Monocyte #: 1 x10 3/mm — ABNORMAL HIGH (ref 0.2–0.9)
Monocyte %: 5.6 %
Neutrophil #: 14.4 10*3/uL — ABNORMAL HIGH (ref 1.4–6.5)
Neutrophil %: 78.4 %
Platelet: 228 10*3/uL (ref 150–440)
RBC: 4.14 10*6/uL (ref 3.80–5.20)
RDW: 15.1 % — ABNORMAL HIGH (ref 11.5–14.5)
WBC: 18.3 10*3/uL — ABNORMAL HIGH (ref 3.6–11.0)

## 2013-11-22 LAB — BASIC METABOLIC PANEL
Anion Gap: 9 (ref 7–16)
BUN: 12 mg/dL (ref 7–18)
Calcium, Total: 8.7 mg/dL (ref 8.5–10.1)
Chloride: 102 mmol/L (ref 98–107)
Co2: 27 mmol/L (ref 21–32)
Creatinine: 0.96 mg/dL (ref 0.60–1.30)
EGFR (African American): >60
EGFR (Non-African Amer.): >60
Glucose: 112 mg/dL — ABNORMAL HIGH (ref 65–99)
Osmolality: 276 (ref 275–301)
Potassium: 3.3 mmol/L — ABNORMAL LOW (ref 3.5–5.1)
Sodium: 138 mmol/L (ref 136–145)

## 2013-11-22 LAB — URINALYSIS, COMPLETE
Bacteria: NONE SEEN
Bilirubin,UR: NEGATIVE
Glucose,UR: NEGATIVE mg/dL (ref 0–75)
Ketone: NEGATIVE
Nitrite: NEGATIVE
Ph: 7 (ref 4.5–8.0)
Protein: NEGATIVE
RBC,UR: 1 /HPF (ref 0–5)
Specific Gravity: 1.01 (ref 1.003–1.030)
Squamous Epithelial: NONE SEEN
WBC UR: 7 /HPF (ref 0–5)

## 2013-11-22 LAB — LIPID PANEL
Cholesterol: 209 mg/dL — ABNORMAL HIGH (ref 0–200)
HDL Cholesterol: 56 mg/dL (ref 40–60)
Ldl Cholesterol, Calc: 133 mg/dL — ABNORMAL HIGH (ref 0–100)
Triglycerides: 99 mg/dL (ref 0–200)
VLDL Cholesterol, Calc: 20 mg/dL (ref 5–40)

## 2013-11-22 LAB — MAGNESIUM: Magnesium: 1.7 mg/dL — ABNORMAL LOW

## 2013-11-22 LAB — TROPONIN I: Troponin-I: <0.02 ng/mL

## 2013-11-22 LAB — CK-MB: CK-MB: <0.5 ng/mL — ABNORMAL LOW (ref 0.5–3.6)

## 2014-02-01 ENCOUNTER — Encounter: Payer: Self-pay | Admitting: Gastroenterology

## 2014-09-01 NOTE — Discharge Summary (Signed)
PATIENT NAME:  [name], [name] MR#:  [id number] DATE OF BIRTH:  08-05-1950  DATE OF ADMISSION:  11/21/2013 DATE OF DISCHARGE:  11/22/2013  DISCHARGE DIAGNOSES:  1.  Atypical chest pain.  2.  Febrile illness, bronchitis.  3.  Hyperlipidemia.  4.  Hypertension.  5.  Anxiety/depression.   DISCHARGE MEDICATIONS:  1.  Aspirin 81 mg daily. 2.  Benicar 20 mg half tab daily. 3.  Metoprolol 50 mg b.i.d.  4.  ProAir 2 puffs q. 4  p.r.n.  5.  Hydrochlorothiazide  25 mg daily. 6.  Prednisone taper.  7.  Levaquin 500 mg daily x 7 days.   REASON FOR ADMISSION: A 64 year old female presents with fever and chest pain. Please see H and P for HPI, past medical history, and physical exam.   HOSPITAL COURSE: The patient was admitted, ruled out for myocardial infarction. Stress Myoview showed no ischemia with normal ejection fraction. Her white count was 18,000. Low-grade fever and exam was consistent with bronchitis. Levaquin was prescribed. She will go home on the Levaquin and prednisone taper to follow up with Dr. Emily Filbert.  Any change in symptom,  she will call.    ____________________________ Rusty Aus, MD mfm:ts D: 11/22/2013 17:25:46 ET T: 11/22/2013 18:35:50 ET JOB#: [id number]  cc: Rusty Aus, MD, <Dictator> Haruo Stepanek Roselee Culver MD ELECTRONICALLY SIGNED 11/23/2013 8:26

## 2014-09-01 NOTE — H&P (Signed)
PATIENT NAME:  [name], [name] MR#:  [id number] DATE OF BIRTH:  [birth year]  DATE OF ADMISSION:  11/21/2013  REFERRING PHYSICIAN:  Dr. Benjaman Lobe  PRIMARY CARE PHYSICIAN: The patient started seeing Dr. Emily Filbert , started seeing him before 2 months.  CARDIOLOGY:  The patient used to follow at [facility] but currently has an appointment with Dr. Nehemiah Massed for this week.   CHIEF COMPLAINT:  Chest pain.   HISTORY OF PRESENT ILLNESS: This is a 64 year old female with known history of coronary artery disease, hyperlipidemia, hypertension, presents with complaints of chest pain. The patient reports she is known to have history of coronary artery disease. She had cardiac catheterization done by cardiology in [facility] in 2004 where she reports she had a small vessel disease with no intervention done. Reports she has been having chest pain intermittent, pressure-like quality midsternal, nonradiating, accompanied by nausea, shortness of breath, and dizziness. Denies any diaphoresis or palpitation with the chest pain. Reports it has been intermittent over the last 2 weeks. Reports she saw Dr. Emily Filbert who referred her to Dr. Nehemiah Massed where she is supposed to see him this week because of that. Reports she was gardening in the afternoon when she had midsternal chest pain.  It resolved initially and then persisted, so she called EMS where they give her nitro paste and aspirin in the Emergency Room which resulted in resolution of her chest pain. Currently, she denies any chest pain. The patient's troponin was negative.  EKG does not show any acute ST abnormalities. As well, patient had been complaining of cough, having chills at home even though she was afebrile here. She was noticed to have leukocytosis at 16,000 here.  Chest x-ray did not show any acute opacity or infiltrate. The patient reports she is having a bout of bronchitis almost once a year.   PAST MEDICAL HISTORY:  1. Hyperlipidemia.  2. Anxiety.   3. Hypertension.  4. Irritable bowel syndrome.  5. Depression.  6. Arthritis.  7. Heart murmur. 8. Asthma.   FAMILY HISTORY: Significant for congestive heart failure in her father and coronary artery disease in the family.   SOCIAL HISTORY: Denies any smoking, any alcohol, or any drug use.   ALLERGIES: DICYCLOMINE, LISINOPRIL, PENICILLIN.   HOME MEDICATIONS:  1. Aspirin 81 mg oral daily.  2. Metoprolol 50 mg oral 2 times a day.  3. Benicar 10 mg oral daily.  4. Albuterol as needed.  5. Hydrochlorothiazide 25 mg oral daily.   REVIEW OF SYSTEMS:   CONSTITUTIONAL:  The patient reports chills, fatigue, weakness. Denies weight gain, weight loss.  EYES: Denies blurry vision, double vision or inflammation.  EARS, NOSE, AND THROAT: Denies tinnitus, ear pain, hearing loss, epistaxis.  RESPIRATORY: Reports cough, nonproductive, as well as reports dyspnea.  Denies COPD.  CARDIOVASCULAR: Reports chest pain. Denies edema, arrhythmia, syncope.  GASTROINTESTINAL: Reports nausea. Denies vomiting, diarrhea, abdominal pain, hematemesis.  GENITOURINARY: Denies dysuria, hematuria, renal colic.  ENDOCRINE: Denies polyuria, polydipsia, heat or cold intolerance.  HEMATOLOGY: Denies anemia, easy bruising, bleeding diathesis.  INTEGUMENTARY: Denies acne, rash or skin lesion.  MUSCULOSKELETAL: Denies any gout, swelling, cramps.  NEUROLOGIC: Denies CVA, TIA, tremors, vertigo, ataxia.  PSYCHIATRIC: Denies anxiety, insomnia, or depression.   PHYSICAL EXAMINATION:  VITAL SIGNS: Temperature 99.8, pulse 89, respiratory rate 16, blood pressure 124/71, saturating 99% on room air.  GENERAL: Well-nourished female who looks comfortable in bed, in no apparent distress.  HEENT: Head atraumatic, normocephalic. Pupils equal, reactive to light. Pink conjunctivae. Anicteric sclerae.  Moist oral mucosa. NECK: Supple. No thyromegaly. No JVD.  CHEST: Good air entry bilaterally. No wheezing, rales or rhonchi.   CARDIOVASCULAR: S1, S2 heard. No rubs or gallops.  ABDOMEN: Soft, nontender, nondistended. Bowel sounds present.  EXTREMITIES: No edema. No clubbing. No cyanosis.  PSYCHIATRIC: Appropriate affect. Awake, alert x 3. Intact judgment and insight. NEUROLOGICAL:  Cranial nerves grossly intact. Motor 5/5. No focal deficits.  SKIN: Normal skin turgor. Warm and dry.  MUSCULOSKELETAL: No joint effusion or erythema.   PERTINENT LABORATORY DATA: Glucose 98, BUN 9, creatinine 0.8, sodium 136, potassium 3, chloride 101, CO2 of 26, ALT 56, AST 61, alkaline phosphatase 88. Troponin less than 0.02. White blood cell 16.2, hemoglobin 13.4, hematocrit 39.7, platelets 253,000.   RADIOLOGICAL DATA:  Chest x-ray: No acute findings.   ASSESSMENT AND PLAN:  1. Chest pain.  Currently, it has been resolved.  But given the patient's history and that it has  been resolved with nitroglycerin, she will be admitted for further workup.  We will admit her to telemetry. We will continue to cycle her cardiac enzymes and follow the trend.  She already received aspirin in the ED.  We will keep her on sublingual nitroglycerin. The patient's cardiac enzymes are negative. We will schedule her for Lexiscan stress test in the morning for risk stratification.  2. Leukocytosis and fever. This is most likely related to her acute bronchitis. We will start her on levofloxacin IV. We will check urine culture as well.  3. Hypertension. Blood pressure acceptable. Continue home medication.  4. History of coronary artery disease. Continue with aspirin, beta blockers, and Benicar, as she is having having lisinopril allergy.  5. Hyperlipidemia. We will check lipid panel, and if indicated, we will start her on statin.  6. Deep vein thrombosis prophylaxis. Subcutaneous heparin.   CODE STATUS: Full code.   TOTAL TIME SPENT ON ADMISSION AND PATIENT CARE: 50 minutes.     ____________________________ Albertine Patricia, MD dse:dd D: 11/21/2013  21:05:06 ET T: 11/21/2013 21:29:31 ET JOB#: [id number]  cc: Albertine Patricia, MD, <Dictator> DAWOOD Graciela Husbands MD ELECTRONICALLY SIGNED 11/22/2013 1:47

## 2014-10-24 ENCOUNTER — Encounter (HOSPITAL_COMMUNITY): Payer: Self-pay | Admitting: Emergency Medicine

## 2014-10-24 DIAGNOSIS — Y9389 Activity, other specified: Secondary | ICD-10-CM | POA: Insufficient documentation

## 2014-10-24 DIAGNOSIS — Z792 Long term (current) use of antibiotics: Secondary | ICD-10-CM | POA: Insufficient documentation

## 2014-10-24 DIAGNOSIS — S90862A Insect bite (nonvenomous), left foot, initial encounter: Secondary | ICD-10-CM | POA: Insufficient documentation

## 2014-10-24 DIAGNOSIS — S90861A Insect bite (nonvenomous), right foot, initial encounter: Secondary | ICD-10-CM | POA: Insufficient documentation

## 2014-10-24 DIAGNOSIS — S40861A Insect bite (nonvenomous) of right upper arm, initial encounter: Secondary | ICD-10-CM | POA: Insufficient documentation

## 2014-10-24 DIAGNOSIS — I10 Essential (primary) hypertension: Secondary | ICD-10-CM | POA: Insufficient documentation

## 2014-10-24 DIAGNOSIS — Z880 Allergy status to penicillin: Secondary | ICD-10-CM | POA: Insufficient documentation

## 2014-10-24 DIAGNOSIS — K5730 Diverticulosis of large intestine without perforation or abscess without bleeding: Secondary | ICD-10-CM | POA: Insufficient documentation

## 2014-10-24 DIAGNOSIS — R21 Rash and other nonspecific skin eruption: Secondary | ICD-10-CM | POA: Insufficient documentation

## 2014-10-24 DIAGNOSIS — W57XXXA Bitten or stung by nonvenomous insect and other nonvenomous arthropods, initial encounter: Secondary | ICD-10-CM | POA: Insufficient documentation

## 2014-10-24 DIAGNOSIS — F329 Major depressive disorder, single episode, unspecified: Secondary | ICD-10-CM | POA: Insufficient documentation

## 2014-10-24 DIAGNOSIS — Y998 Other external cause status: Secondary | ICD-10-CM | POA: Insufficient documentation

## 2014-10-24 DIAGNOSIS — Z86718 Personal history of other venous thrombosis and embolism: Secondary | ICD-10-CM | POA: Insufficient documentation

## 2014-10-24 DIAGNOSIS — E785 Hyperlipidemia, unspecified: Secondary | ICD-10-CM | POA: Insufficient documentation

## 2014-10-24 DIAGNOSIS — K5792 Diverticulitis of intestine, part unspecified, without perforation or abscess without bleeding: Secondary | ICD-10-CM | POA: Insufficient documentation

## 2014-10-24 DIAGNOSIS — Z79899 Other long term (current) drug therapy: Secondary | ICD-10-CM | POA: Insufficient documentation

## 2014-10-24 DIAGNOSIS — L03116 Cellulitis of left lower limb: Secondary | ICD-10-CM | POA: Insufficient documentation

## 2014-10-24 DIAGNOSIS — Y9289 Other specified places as the place of occurrence of the external cause: Secondary | ICD-10-CM | POA: Insufficient documentation

## 2014-10-24 NOTE — ED Notes (Signed)
Pt st's she was bitten by ? Insect 3 days ago now left foot red and swollen  Also c/o itching

## 2014-10-25 ENCOUNTER — Emergency Department (HOSPITAL_COMMUNITY)
Admission: EM | Admit: 2014-10-25 | Discharge: 2014-10-25 | Disposition: A | Discharge status: Home / Self Care | Payer: No Typology Code available for payment source | Attending: Emergency Medicine | Admitting: Emergency Medicine

## 2014-10-25 DIAGNOSIS — L03116 Cellulitis of left lower limb: Secondary | ICD-10-CM

## 2014-10-25 DIAGNOSIS — L282 Other prurigo: Secondary | ICD-10-CM

## 2014-10-25 DIAGNOSIS — R21 Rash and other nonspecific skin eruption: Secondary | ICD-10-CM

## 2014-10-25 LAB — BASIC METABOLIC PANEL
Anion gap: 13 (ref 5–15)
BUN: 16 mg/dL (ref 6–20)
CO2: 27 mmol/L (ref 22–32)
Calcium: 9.6 mg/dL (ref 8.9–10.3)
Chloride: 99 mmol/L — ABNORMAL LOW (ref 101–111)
Creatinine, Ser: 0.77 mg/dL (ref 0.44–1.00)
GFR calc Af Amer: >60 mL/min (ref >60)
GFR calc non Af Amer: >60 mL/min (ref >60)
Glucose, Bld: 105 mg/dL — ABNORMAL HIGH (ref 65–99)
Potassium: 3 mmol/L — ABNORMAL LOW (ref 3.5–5.1)
Sodium: 139 mmol/L (ref 135–145)

## 2014-10-25 LAB — CBC WITH DIFFERENTIAL/PLATELET
Basophils Absolute: 0 10*3/uL (ref 0.0–0.1)
Basophils Relative: 0 % (ref 0–1)
Eosinophils Absolute: 0.5 10*3/uL (ref 0.0–0.7)
Eosinophils Relative: 5 % (ref 0–5)
HCT: 38.3 % (ref 36.0–46.0)
Hemoglobin: 13.7 g/dL (ref 12.0–15.0)
Lymphocytes Relative: 41 % (ref 12–46)
Lymphs Abs: 3.9 10*3/uL (ref 0.7–4.0)
MCH: 30.9 pg (ref 26.0–34.0)
MCHC: 35.8 g/dL (ref 30.0–36.0)
MCV: 86.5 fL (ref 78.0–100.0)
Monocytes Absolute: 0.8 10*3/uL (ref 0.1–1.0)
Monocytes Relative: 9 % (ref 3–12)
Neutro Abs: 4.3 10*3/uL (ref 1.7–7.7)
Neutrophils Relative %: 45 % (ref 43–77)
Platelets: 288 10*3/uL (ref 150–400)
RBC: 4.43 MIL/uL (ref 3.87–5.11)
RDW: 13.9 % (ref 11.5–15.5)
WBC: 9.5 10*3/uL (ref 4.0–10.5)

## 2014-10-25 MED ORDER — SULFAMETHOXAZOLE-TRIMETHOPRIM 800-160 MG PO TABS: 1.0000 | ORAL_TABLET | Freq: Two times a day (BID) | ORAL | Status: DC

## 2014-10-25 MED ORDER — DIPHENHYDRAMINE HCL 25 MG PO TABS
25.0000 mg | ORAL_TABLET | Freq: Four times a day (QID) | ORAL | Status: DC
Start: 2014-10-25 — End: 2015-03-08

## 2014-10-25 MED ORDER — DIPHENHYDRAMINE HCL 25 MG PO CAPS
25.0000 mg | ORAL_CAPSULE | Freq: Once | ORAL | Status: AC
  Administered 2014-10-25: 25 mg via ORAL
  Filled 2014-10-25: qty 1

## 2014-10-25 MED ORDER — HYDROCORTISONE 1 % EX CREA
TOPICAL_CREAM | Freq: Once | CUTANEOUS | Status: AC
  Administered 2014-10-25: 1 via TOPICAL
  Filled 2014-10-25: qty 28

## 2014-10-25 MED ORDER — SULFAMETHOXAZOLE-TRIMETHOPRIM 800-160 MG PO TABS
1.0000 | ORAL_TABLET | Freq: Once | ORAL | Status: AC
  Administered 2014-10-25: 1 via ORAL
  Filled 2014-10-25: qty 1

## 2014-10-25 MED ORDER — HYDROCORTISONE 1 % EX CREA
TOPICAL_CREAM | Freq: Three times a day (TID) | CUTANEOUS | Status: DC
Start: 2014-10-25 — End: 2015-03-08

## 2014-10-25 NOTE — ED Notes (Addendum)
Pt presents with multiple insect bites. The one to L foot started on Sunday, bite to R arm started on Monday, and bite to R foot started tonight. L ankle swollen with two wounds to L lower leg; R ankle with mild swelling. C/o bilateral throbbing pain to feet. Denies chills and fever

## 2014-10-25 NOTE — Discharge Instructions (Signed)
Your rash appears to be due to an allergic reaction to something that you have come in contact with.  Wash all clothes and bedding in hypoallergenic detergent, such as all free and clear or similar detergent.  Use hypoallergenic soap.  Use medications as prescribed.  If the redness in worsens outside the lines drawn today, return to the emergency department, as you may need IV antibiotics.  Follow-up with your doctor in 2 days for recheck.  Keep the wounds on your left ankle clean, wash twice daily with soap and water and apply a topical antibiotics ointment with a Band-Aid.    Cellulitis Cellulitis is an infection of the skin and the tissue beneath it. The infected area is usually red and tender. Cellulitis occurs most often in the arms and lower legs.  CAUSES  Cellulitis is caused by bacteria that enter the skin through cracks or cuts in the skin. The most common types of bacteria that cause cellulitis are staphylococci and streptococci. SIGNS AND SYMPTOMS   Redness and warmth.  Swelling.  Tenderness or pain.  Fever. DIAGNOSIS  Your health care provider can usually determine what is wrong based on a physical exam. Blood tests may also be done. TREATMENT  Treatment usually involves taking an antibiotic medicine. HOME CARE INSTRUCTIONS   Take your antibiotic medicine as directed by your health care provider. Finish the antibiotic even if you start to feel better.  Keep the infected arm or leg elevated to reduce swelling.  Apply a warm cloth to the affected area up to 4 times per day to relieve pain.  Take medicines only as directed by your health care provider.  Keep all follow-up visits as directed by your health care provider. SEEK MEDICAL CARE IF:   You notice red streaks coming from the infected area.  Your red area gets larger or turns dark in color.  Your bone or joint underneath the infected area becomes painful after the skin has healed.  Your infection returns in the  same area or another area.  You notice a swollen bump in the infected area.  You develop new symptoms.  You have a fever. SEEK IMMEDIATE MEDICAL CARE IF:   You feel very sleepy.  You develop vomiting or diarrhea.  You have a general ill feeling (malaise) with muscle aches and pains. MAKE SURE YOU:   Understand these instructions.  Will watch your condition.  Will get help right away if you are not doing well or get worse. Document Released: 02/04/2005 Document Revised: 09/11/2013 Document Reviewed: 07/13/2011 [facility] Patient Information 2015 [place], [place]. This information is not intended to replace advice given to you by your health care provider. Make sure you discuss any questions you have with your health care provider.  Contact Dermatitis Contact dermatitis is a reaction to certain substances that touch the skin. Contact dermatitis can be either irritant contact dermatitis or allergic contact dermatitis. Irritant contact dermatitis does not require previous exposure to the substance for a reaction to occur.Allergic contact dermatitis only occurs if you have been exposed to the substance before. Upon a repeat exposure, your body reacts to the substance.  CAUSES  Many substances can cause contact dermatitis. Irritant dermatitis is most commonly caused by repeated exposure to mildly irritating substances, such as:  Makeup.  Soaps.  Detergents.  Bleaches.  Acids.  Metal salts, such as nickel. Allergic contact dermatitis is most commonly caused by exposure to:  Poisonous plants.  Chemicals (deodorants, shampoos).  Jewelry.  Latex.  Neomycin  in triple antibiotic cream.  Preservatives in products, including clothing. SYMPTOMS  The area of skin that is exposed may develop:  Dryness or flaking.  Redness.  Cracks.  Itching.  Pain or a burning sensation.  Blisters. With allergic contact dermatitis, there may also be swelling in areas such as the  eyelids, mouth, or genitals.  DIAGNOSIS  Your caregiver can usually tell what the problem is by doing a physical exam. In cases where the cause is uncertain and an allergic contact dermatitis is suspected, a patch skin test may be performed to help determine the cause of your dermatitis. TREATMENT Treatment includes protecting the skin from further contact with the irritating substance by avoiding that substance if possible. Barrier creams, powders, and gloves may be helpful. Your caregiver may also recommend:  Steroid creams or ointments applied 2 times daily. For best results, soak the rash area in cool water for 20 minutes. Then apply the medicine. Cover the area with a plastic wrap. You can store the steroid cream in the refrigerator for a "chilly" effect on your rash. That may decrease itching. Oral steroid medicines may be needed in more severe cases.  Antibiotics or antibacterial ointments if a skin infection is present.  Antihistamine lotion or an antihistamine taken by mouth to ease itching.  Lubricants to keep moisture in your skin.  Burow's solution to reduce redness and soreness or to dry a weeping rash. Mix one packet or tablet of solution in 2 cups cool water. Dip a clean washcloth in the mixture, wring it out a bit, and put it on the affected area. Leave the cloth in place for 30 minutes. Do this as often as possible throughout the day.  Taking several cornstarch or baking soda baths daily if the area is too large to cover with a washcloth. Harsh chemicals, such as alkalis or acids, can cause skin damage that is like a burn. You should flush your skin for 15 to 20 minutes with cold water after such an exposure. You should also seek immediate medical care after exposure. Bandages (dressings), antibiotics, and pain medicine may be needed for severely irritated skin.  HOME CARE INSTRUCTIONS  Avoid the substance that caused your reaction.  Keep the area of skin that is affected away  from hot water, soap, sunlight, chemicals, acidic substances, or anything else that would irritate your skin.  Do not scratch the rash. Scratching may cause the rash to become infected.  You may take cool baths to help stop the itching.  Only take over-the-counter or prescription medicines as directed by your caregiver.  See your caregiver for follow-up care as directed to make sure your skin is healing properly. SEEK MEDICAL CARE IF:   Your condition is not better after 3 days of treatment.  You seem to be getting worse.  You see signs of infection such as swelling, tenderness, redness, soreness, or warmth in the affected area.  You have any problems related to your medicines. Document Released: 04/24/2000 Document Revised: 07/20/2011 Document Reviewed: 09/30/2010 [facility] Patient Information 2015 [place], [place]. This information is not intended to replace advice given to you by your health care provider. Make sure you discuss any questions you have with your health care provider.  Pruritus  Pruritus is an itch. There are many different problems that can cause an itch. Dry skin is one of the most common causes of itching. Most cases of itching do not require medical attention.  HOME CARE INSTRUCTIONS  Make sure your skin  is moistened on a regular basis. A moisturizer that contains petroleum jelly is best for keeping moisture in your skin. If you develop a rash, you may try the following for relief:   Use corticosteroid cream.  Apply cool compresses to the affected areas.  Bathe with Epsom salts or baking soda in the bathwater.  Soak in colloidal oatmeal baths. These are available at your pharmacy.  Apply baking soda paste to the rash. Stir water into baking soda until it reaches a paste-like consistency.  Use an anti-itch lotion.  Take over-the-counter diphenhydramine medicine by mouth as the instructions direct.  Avoid scratching. Scratching may cause the rash to become  infected. If itching is very bad, your caregiver may suggest prescription lotions or creams to lessen your symptoms.  Avoid hot showers, which can make itching worse. A cold shower may help with itching as long as you use a moisturizer after the shower. SEEK MEDICAL CARE IF: The itching does not go away after several days. Document Released: 01/07/2011 Document Revised: 09/11/2013 Document Reviewed: 01/07/2011 [facility] Patient Information 2015 [place], [place]. This information is not intended to replace advice given to you by your health care provider. Make sure you discuss any questions you have with your health care provider.

## 2014-10-25 NOTE — ED Provider Notes (Signed)
CSN: [id number]     Arrival date & time 10/24/14  2213 History  This chart was scribed for Linton Flemings, MD by Rayfield Citizen, ED Scribe. This patient was seen in room D35C/D35C and the patient's care was started at 1:45 AM.    Chief Complaint  Patient presents with  . Insect Bite   The history is provided by the patient. No language interpreter was used.     HPI Comments: [name] is a 64 y.o. female who presents to the Emergency Department complaining of multiple areas of redness, swelling and itching to her skin which she attributes to insect bites. She notes an area to left ankle and dorsum of left foot, as well as dorsum of right foot and right upper arm. Patient explains she originally noticed the area to the left foot on 6/12, the right arm beginning 6/13, and right foot noted 6/15. She has applied a small amount of OTC topical ointment without relief. She denies fever, chills.   Patient has a rash reaction to penicillin.   Past Medical History  Diagnosis Date  . Hypertension   . Diverticulitis   . Depression   . Hyperlipemia   . DVT (deep venous thrombosis)   . Esophageal stricture   . Diverticulosis of colon   . Constipation   . Sleep apnea   . IBS (irritable bowel syndrome)   . IBS (irritable bowel syndrome)    Past Surgical History  Procedure Laterality Date  . Appendectomy    . Cholecystectomy    . Vaginal hysterectomy    . Nissen fundoplication    . Gallbladder surgery  2003  . Blood clot  2004    RIGHT LEG  . Plantar fascia surgery  2007  . Sigmoid colectomy  10/21/2010    pelvic anastomosis  . Colon surgery     Family History  Problem Relation Age of Onset  . Heart disease Mother   . Crohn's disease Sister   . Colon polyps Sister   . Irritable bowel syndrome Brother   . Colon cancer Neg Hx   . Colitis Sister   . Cancer Father    History  Substance Use Topics  . Smoking status: Never Smoker   . Smokeless tobacco: Never Used  . Alcohol Use: No    OB History    No data available     Review of Systems  Constitutional: Negative for fever and chills.  All other systems reviewed and are negative.   Allergies  Ace inhibitors; Dicyclomine; Lisinopril; and Penicillins  Home Medications   Prior to Admission medications   Medication Sig Start Date End Date Taking? Authorizing Provider  ciprofloxacin (CIPRO) 500 MG tablet Take 500 mg by mouth 2 (two) times daily.      Historical Provider, MD  citalopram (CELEXA) 40 MG tablet Take 40 mg by mouth daily.      Historical Provider, MD  hydrochlorothiazide 25 MG tablet Take 25 mg by mouth daily.      Historical Provider, MD  HYDROcodone-acetaminophen (NORCO) 5-325 MG per tablet Take 1 tablet by mouth every 6 (six) hours as needed for pain. 08/12/10   Willia Craze, NP  hyoscyamine (LEVSINEX) 0.375 MG 12 hr capsule Take 0.375 mg by mouth. 1 by mouth bid     Historical Provider, MD  metoprolol (LOPRESSOR) 50 MG tablet Take 50 mg by mouth. Take 1 tablet by mouth two times a day     Historical Provider, MD  metroNIDAZOLE (FLAGYL)  250 MG tablet Take 250 mg by mouth 3 (three) times daily.      Historical Provider, MD  ondansetron (ZOFRAN-ODT) 4 MG disintegrating tablet Take 1 tablet (4 mg total) by mouth every 6 (six) hours. 08/12/10   Willia Craze, NP  pravastatin (PRAVACHOL) 40 MG tablet Take 40 mg by mouth daily.      Historical Provider, MD   BP 167/76 mmHg  Pulse 90  Temp(Src) 98.4 F (36.9 C)  Resp 20  Wt 155 lb (70.308 kg)  SpO2 97% Physical Exam  Constitutional: She is oriented to person, place, and time. She appears well-developed and well-nourished. No distress.  HENT:  Head: Normocephalic and atraumatic.  Eyes: EOM are normal. Pupils are equal, round, and reactive to light.  Neck: Normal range of motion. Neck supple. No JVD present.  Cardiovascular: Normal rate, regular rhythm and normal heart sounds.  Exam reveals no gallop and no friction rub.   No murmur  heard. Pulmonary/Chest: Effort normal and breath sounds normal. No respiratory distress. She has no wheezes. She has no rales.  Abdominal: Soft. Bowel sounds are normal. She exhibits no mass. There is no tenderness. There is no rebound and no guarding.  Musculoskeletal: Normal range of motion. She exhibits no edema.  Moves all extremities normally.   Neurological: She is alert and oriented to person, place, and time. She displays normal reflexes.  Skin: Skin is warm and dry. No rash noted.  Vesicular rash to right lateral upper arm and right foot; has excoriations to left ankle with sinus of cellulitis   Psychiatric: She has a normal mood and affect. Her behavior is normal.  Nursing note and vitals reviewed.   ED Course  Procedures   DIAGNOSTIC STUDIES: Oxygen Saturation is 97% on RA, adequate by my interpretation.    COORDINATION OF CARE: 1:49 AM Discussed treatment plan with pt at bedside and pt agreed to plan.   Labs Review Labs Reviewed  BASIC METABOLIC PANEL - Abnormal; Notable for the following:    Potassium 3.0 (*)    Chloride 99 (*)    Glucose, Bld 105 (*)    All other components within normal limits  CBC WITH DIFFERENTIAL/PLATELET    Imaging Review No results found.   EKG Interpretation None      MDM   Final diagnoses:  Rash of unknown cause  Pruritic rash  Cellulitis of left lower extremity    64 year old female with periodic rash to right arm, bilateral ankles/feet.  Lesions look vesicular almost like poison ivy.  Patient denies any known contact allergen.  Patient's rash worsens when she put her socks back on, so she may have an allergy to a soap or detergent that she is using.  She has no area on her left ankle where she has scratched until his become infected.  She has mild cellulitis.  She is allergic to penicillin.  Plan for Bactrim, topical hydrocortisone cream for areas of itching.  Benadryl also for itching.  Patient to see her doctor in 2 days for  recheck.  Patient given precautions for return     Linton Flemings, MD 10/25/14 [phone]

## 2015-02-11 ENCOUNTER — Ambulatory Visit: Payer: No Typology Code available for payment source | Admitting: Internal Medicine

## 2015-03-08 ENCOUNTER — Ambulatory Visit (INDEPENDENT_AMBULATORY_CARE_PROVIDER_SITE_OTHER): Payer: No Typology Code available for payment source | Admitting: Internal Medicine

## 2015-03-08 ENCOUNTER — Encounter: Payer: Self-pay | Admitting: Internal Medicine

## 2015-03-08 VITALS — BP 150/78 | HR 51 | Ht 63.0 in | Wt 151.0 lb

## 2015-03-08 DIAGNOSIS — R0789 Other chest pain: Secondary | ICD-10-CM

## 2015-03-08 DIAGNOSIS — I10 Essential (primary) hypertension: Secondary | ICD-10-CM

## 2015-03-08 HISTORY — DX: Other chest pain: R07.89

## 2015-03-08 MED ORDER — METOPROLOL TARTRATE 50 MG PO TABS: 50.0000 mg | ORAL_TABLET | Freq: Two times a day (BID) | ORAL | Status: DC

## 2015-03-08 MED ORDER — HYDROCHLOROTHIAZIDE 25 MG PO TABS: 25.0000 mg | ORAL_TABLET | Freq: Every day | ORAL | Status: DC

## 2015-03-08 NOTE — Progress Notes (Signed)
OFFICE NOTE  Chief Complaint:  Chest pain  Primary Care Physician: Rusty Aus., MD  HPI:  [name] is a 64 year old female who recently lost her insurance and has not followed up with her primary care provider, Dr. Emily Filbert in [place]. She recently presented to [facility] with left arm heaviness and numbness and chest discomfort. She was diagnosed with complex migraine. There was no specific headache during this episode she does not have a headache history. On September 9 she felt like she was having a heart attack. She had some heaviness in her chest and shortness of breath. EKG showed no significant changes. Apparently she had had a stress test at [facility] in 2015 which was negative for ischemia. She does have a history of sleep apnea in the past. She also has anxiety but is not currently taking medicine for that and hypertension. She reports being close to running out of her medications and does not have a primary care physician.  PMHx:  Past Medical History  Diagnosis Date  . Hypertension   . Diverticulitis   . Depression   . Hyperlipemia   . DVT (deep venous thrombosis) ([place])   . Esophageal stricture   . Diverticulosis of colon   . Constipation   . Sleep apnea   . IBS (irritable bowel syndrome)     Past Surgical History  Procedure Laterality Date  . Appendectomy    . Cholecystectomy    . Vaginal hysterectomy    . Nissen fundoplication    . Gallbladder surgery  2003  . Blood clot  2004    RIGHT LEG  . Plantar fascia surgery  2007  . Sigmoid colectomy  10/21/2010    pelvic anastomosis  . Colon surgery      FAMHx:  Family History  Problem Relation Age of Onset  . Heart disease Mother   . Crohn's disease Sister   . Colon polyps Sister   . Irritable bowel syndrome Brother   . Colon cancer Neg Hx   . Colitis Sister   . Brain cancer Father     SOCHx:   reports that she has never smoked. She has never used smokeless tobacco.  She reports that she does not drink alcohol or use illicit drugs.  ALLERGIES:  Allergies  Allergen Reactions  . Ace Inhibitors     Unknown to patient  . Dicyclomine     REACTION: Choking  . Lisinopril     REACTION: Choking  . Penicillins     REACTION: Rash    ROS: A comprehensive review of systems was negative except for: Cardiovascular: positive for chest pain  HOME MEDS: Current Outpatient Prescriptions  Medication Sig Dispense Refill  . albuterol (PROAIR HFA) 108 (90 BASE) MCG/ACT inhaler Inhale 1 puff into the lungs as needed.    . ALPRAZolam (XANAX) 0.25 MG tablet Take 0.25 mg by mouth at bedtime.    Marland Kitchen aspirin EC 81 MG tablet Take 81 mg by mouth daily.    . Bacillus Coagulans-Inulin (PROBIOTIC-PREBIOTIC) 1-250 BILLION-MG CAPS Take 1 capsule by mouth daily.    . Cholecalciferol (VITAMIN D-3 PO) Take 1 capsule by mouth daily.    . hydrochlorothiazide (HYDRODIURIL) 25 MG tablet Take 1 tablet (25 mg total) by mouth daily. 30 tablet 3  . metoprolol (LOPRESSOR) 50 MG tablet Take 1 tablet (50 mg total) by mouth 2 (two) times daily. 60 tablet 3  . polyethylene glycol (MIRALAX / GLYCOLAX) packet Take 17 g by  mouth daily.     No current facility-administered medications for this visit.    LABS/IMAGING: No results found for this or any previous visit (from the past 48 hour(s)). No results found.  WEIGHTS: Wt Readings from Last 3 Encounters:  03/08/15 151 lb (68.493 kg)  10/24/14 155 lb (70.308 kg)  11/20/10 156 lb 3.2 oz (70.852 kg)    VITALS: BP 150/78 mmHg  Pulse 51  Ht 5\' 3"  (1.6 m)  Wt 151 lb (68.493 kg)  BMI 26.76 kg/m2  EXAM: General appearance: alert and no distress Neck: no carotid bruit and no JVD Lungs: clear to auscultation bilaterally Heart: regular rate and rhythm, S1, S2 normal, no murmur, click, rub or gallop Abdomen: soft, non-tender; bowel sounds normal; no masses,  no organomegaly Extremities: extremities normal, atraumatic, no cyanosis or  edema Pulses: 2+ and symmetric Skin: Skin color, texture, turgor normal. No rashes or lesions Neurologic: Grossly normal Psych: Pleasant  EKG: Sinus bradycardia with sinus arrhythmia 51  ASSESSMENT: 1. Atypical chest pain 2. Left arm pain possibly migraine variant 3. Hypertension-controlled 4. Anxiety  PLAN: 1.   Mrs. Wannamaker is not describing typical anginal symptoms. This episode that she had that took her to [facility] was fully evaluated and thought not to be cardiac in nature. I'm not convinced although she may have had a complicated migraine. She does no headache history. She reports no further events. I do not think we need to do any additional testing at this time, especially in light of negative nuclear stress testing last year. Should she have further symptoms, we may need to consider stress testing or perhaps monitoring as these episodes could possibly be a TIA.  She is requested a primary care provider and will provide her some names with regards to the resident clinic that she does not currently have healthcare insurance.  Pixie Casino, MD, [facility] Attending Cardiologist [place] 03/08/2015, 9:41 AM

## 2015-03-08 NOTE — Patient Instructions (Addendum)
Your physician recommends that you schedule a follow-up appointment as needed.   [place] [facility] resident [facility]

## 2016-03-17 DIAGNOSIS — I10 Essential (primary) hypertension: Secondary | ICD-10-CM | POA: Diagnosis not present

## 2016-03-17 DIAGNOSIS — R350 Frequency of micturition: Secondary | ICD-10-CM | POA: Diagnosis not present

## 2016-03-17 DIAGNOSIS — M545 Low back pain: Secondary | ICD-10-CM | POA: Diagnosis not present

## 2016-03-17 DIAGNOSIS — Z23 Encounter for immunization: Secondary | ICD-10-CM | POA: Diagnosis not present

## 2016-03-17 DIAGNOSIS — K5730 Diverticulosis of large intestine without perforation or abscess without bleeding: Secondary | ICD-10-CM | POA: Diagnosis not present

## 2016-03-17 DIAGNOSIS — K219 Gastro-esophageal reflux disease without esophagitis: Secondary | ICD-10-CM | POA: Diagnosis not present

## 2016-03-31 DIAGNOSIS — M62830 Muscle spasm of back: Secondary | ICD-10-CM | POA: Diagnosis not present

## 2016-03-31 DIAGNOSIS — F5102 Adjustment insomnia: Secondary | ICD-10-CM | POA: Diagnosis not present

## 2016-03-31 DIAGNOSIS — F4322 Adjustment disorder with anxiety: Secondary | ICD-10-CM | POA: Diagnosis not present

## 2016-04-30 DIAGNOSIS — R32 Unspecified urinary incontinence: Secondary | ICD-10-CM | POA: Diagnosis not present

## 2016-04-30 DIAGNOSIS — Z23 Encounter for immunization: Secondary | ICD-10-CM | POA: Diagnosis not present

## 2016-04-30 DIAGNOSIS — F4322 Adjustment disorder with anxiety: Secondary | ICD-10-CM | POA: Diagnosis not present

## 2016-04-30 DIAGNOSIS — N898 Other specified noninflammatory disorders of vagina: Secondary | ICD-10-CM | POA: Diagnosis not present

## 2016-04-30 DIAGNOSIS — I10 Essential (primary) hypertension: Secondary | ICD-10-CM | POA: Diagnosis not present

## 2016-07-02 ENCOUNTER — Observation Stay (HOSPITAL_COMMUNITY)
Admission: EM | Admit: 2016-07-02 | Discharge: 2016-07-04 | Disposition: A | Discharge status: Home / Self Care | Payer: PPO | Attending: Internal Medicine | Admitting: Internal Medicine

## 2016-07-02 ENCOUNTER — Encounter (HOSPITAL_COMMUNITY): Payer: Self-pay

## 2016-07-02 ENCOUNTER — Emergency Department (HOSPITAL_COMMUNITY): Payer: PPO

## 2016-07-02 DIAGNOSIS — Z79899 Other long term (current) drug therapy: Secondary | ICD-10-CM | POA: Insufficient documentation

## 2016-07-02 DIAGNOSIS — R0789 Other chest pain: Secondary | ICD-10-CM | POA: Diagnosis present

## 2016-07-02 DIAGNOSIS — Z888 Allergy status to other drugs, medicaments and biological substances status: Secondary | ICD-10-CM | POA: Insufficient documentation

## 2016-07-02 DIAGNOSIS — Z7982 Long term (current) use of aspirin: Secondary | ICD-10-CM | POA: Insufficient documentation

## 2016-07-02 DIAGNOSIS — Z86718 Personal history of other venous thrombosis and embolism: Secondary | ICD-10-CM | POA: Insufficient documentation

## 2016-07-02 DIAGNOSIS — F329 Major depressive disorder, single episode, unspecified: Secondary | ICD-10-CM | POA: Insufficient documentation

## 2016-07-02 DIAGNOSIS — K5730 Diverticulosis of large intestine without perforation or abscess without bleeding: Secondary | ICD-10-CM | POA: Diagnosis present

## 2016-07-02 DIAGNOSIS — R001 Bradycardia, unspecified: Secondary | ICD-10-CM | POA: Diagnosis present

## 2016-07-02 DIAGNOSIS — R079 Chest pain, unspecified: Secondary | ICD-10-CM | POA: Diagnosis not present

## 2016-07-02 DIAGNOSIS — I2510 Atherosclerotic heart disease of native coronary artery without angina pectoris: Secondary | ICD-10-CM | POA: Insufficient documentation

## 2016-07-02 DIAGNOSIS — R0600 Dyspnea, unspecified: Principal | ICD-10-CM | POA: Insufficient documentation

## 2016-07-02 DIAGNOSIS — F419 Anxiety disorder, unspecified: Secondary | ICD-10-CM | POA: Diagnosis not present

## 2016-07-02 DIAGNOSIS — R0602 Shortness of breath: Secondary | ICD-10-CM | POA: Diagnosis not present

## 2016-07-02 DIAGNOSIS — R0609 Other forms of dyspnea: Secondary | ICD-10-CM | POA: Diagnosis not present

## 2016-07-02 DIAGNOSIS — Z880 Allergy status to penicillin: Secondary | ICD-10-CM | POA: Diagnosis not present

## 2016-07-02 DIAGNOSIS — F411 Generalized anxiety disorder: Secondary | ICD-10-CM

## 2016-07-02 DIAGNOSIS — Z8249 Family history of ischemic heart disease and other diseases of the circulatory system: Secondary | ICD-10-CM | POA: Diagnosis not present

## 2016-07-02 DIAGNOSIS — I10 Essential (primary) hypertension: Secondary | ICD-10-CM | POA: Diagnosis not present

## 2016-07-02 HISTORY — DX: Dyspnea, unspecified: R06.00

## 2016-07-02 HISTORY — DX: Bradycardia, unspecified: R00.1

## 2016-07-02 HISTORY — DX: Other forms of dyspnea: R06.09

## 2016-07-02 HISTORY — DX: Chest pain, unspecified: R07.9

## 2016-07-02 LAB — CBC
HCT: 40.4 % (ref 36.0–46.0)
Hemoglobin: 14.1 g/dL (ref 12.0–15.0)
MCH: 30.9 pg (ref 26.0–34.0)
MCHC: 34.9 g/dL (ref 30.0–36.0)
MCV: 88.6 fL (ref 78.0–100.0)
Platelets: 281 10*3/uL (ref 150–400)
RBC: 4.56 MIL/uL (ref 3.87–5.11)
RDW: 14.1 % (ref 11.5–15.5)
WBC: 11.8 10*3/uL — ABNORMAL HIGH (ref 4.0–10.5)

## 2016-07-02 LAB — TSH: TSH: 1.174 u[IU]/mL (ref 0.350–4.500)

## 2016-07-02 LAB — BASIC METABOLIC PANEL
Anion gap: 13 (ref 5–15)
BUN: 14 mg/dL (ref 6–20)
CO2: 26 mmol/L (ref 22–32)
Calcium: 9.9 mg/dL (ref 8.9–10.3)
Chloride: 100 mmol/L — ABNORMAL LOW (ref 101–111)
Creatinine, Ser: 0.86 mg/dL (ref 0.44–1.00)
GFR calc Af Amer: >60 mL/min (ref >60)
GFR calc non Af Amer: >60 mL/min (ref >60)
Glucose, Bld: 95 mg/dL (ref 65–99)
Potassium: 3.6 mmol/L (ref 3.5–5.1)
Sodium: 139 mmol/L (ref 135–145)

## 2016-07-02 LAB — I-STAT TROPONIN, ED: Troponin i, poc: 0 ng/mL (ref 0.00–0.08)

## 2016-07-02 LAB — D-DIMER, QUANTITATIVE: D-Dimer, Quant: 0.4 ug/mL-FEU (ref 0.00–0.50)

## 2016-07-02 LAB — BRAIN NATRIURETIC PEPTIDE: B Natriuretic Peptide: 81.1 pg/mL (ref 0.0–100.0)

## 2016-07-02 LAB — TROPONIN I
Troponin I: <0.03 ng/mL (ref <0.03)
Troponin I: <0.03 ng/mL (ref <0.03)
Troponin I: <0.03 ng/mL (ref <0.03)

## 2016-07-02 MED ORDER — ONDANSETRON HCL 4 MG/2ML IJ SOLN: 4.0000 mg | Freq: Four times a day (QID) | INTRAMUSCULAR | Status: DC | PRN

## 2016-07-02 MED ORDER — TRAZODONE HCL 50 MG PO TABS
50.0000 mg | ORAL_TABLET | Freq: Every evening | ORAL | Status: DC | PRN
  Administered 2016-07-02 – 2016-07-03 (×2): 50 mg via ORAL
  Filled 2016-07-02 (×2): qty 1

## 2016-07-02 MED ORDER — ACETAMINOPHEN 325 MG PO TABS
650.0000 mg | ORAL_TABLET | ORAL | Status: DC | PRN
  Administered 2016-07-03: 650 mg via ORAL
  Filled 2016-07-02 (×2): qty 2

## 2016-07-02 MED ORDER — SODIUM CHLORIDE 0.9 % IV SOLN
INTRAVENOUS | Status: AC
  Administered 2016-07-02: 18:00:00 via INTRAVENOUS

## 2016-07-02 MED ORDER — ALPRAZOLAM 0.25 MG PO TABS
0.2500 mg | ORAL_TABLET | Freq: Two times a day (BID) | ORAL | Status: DC | PRN
  Administered 2016-07-02 – 2016-07-03 (×2): 0.25 mg via ORAL
  Filled 2016-07-02 (×2): qty 1

## 2016-07-02 MED ORDER — ENOXAPARIN SODIUM 40 MG/0.4ML ~~LOC~~ SOLN
40.0000 mg | SUBCUTANEOUS | Status: DC
  Administered 2016-07-02 – 2016-07-03 (×2): 40 mg via SUBCUTANEOUS
  Filled 2016-07-02 (×2): qty 0.4

## 2016-07-02 MED ORDER — ASPIRIN 325 MG PO TABS
325.0000 mg | ORAL_TABLET | Freq: Every day | ORAL | Status: DC
  Administered 2016-07-03 – 2016-07-04 (×2): 325 mg via ORAL
  Filled 2016-07-02 (×2): qty 1

## 2016-07-02 MED ORDER — MORPHINE SULFATE (PF) 4 MG/ML IV SOLN: 2.0000 mg | INTRAVENOUS | Status: DC | PRN

## 2016-07-02 MED ORDER — HYDROCHLOROTHIAZIDE 25 MG PO TABS
25.0000 mg | ORAL_TABLET | Freq: Every day | ORAL | Status: DC
  Administered 2016-07-03 – 2016-07-04 (×2): 25 mg via ORAL
  Filled 2016-07-02 (×2): qty 1

## 2016-07-02 NOTE — H&P (Signed)
History and Physical    [name] [id number] DOB: [birth year] DOA: 07/02/2016  PCP: Leeroy Cha, MD Patient coming from: home  Chief Complaint: chest pain/DOE  HPI: [name] is a 66 y.o. female with medical history significant for irritable bowel syndrome, ?CAD, hypertension, hyperlipidemia presents to the emergency department from the doctor's office after having found to have a heart rate of 46. Also complains of dyspnea with exertion and chest pain. Being admitted for chest pain rule out and evaluation of Bradycardia and chest pain  Information is obtained from patient. She states she was at her doctor's office today for routine visit but over the last month she is just "not felt quite right". She complains of dyspnea with mild exertion such as household chores mopping the floors change in the she's on the bed. Associated symptoms include intermittent chest pain described as a pressure located left anterior with radiating to the arm. She also reports intermittent lower extremity edema mostly at the end of the day. She denies headache dizziness syncope or near-syncope. She denies palpitations abdominal pain nausea vomiting diaphoresis. She denies dysuria hematuria frequency or urgency. She denies fever chills recent travel or sick contacts. Denies any recent change in her medications and reports compliance with her medications   ED Course: Sinus bradycardia Otherwise normal ECG No acute changes No significant change since last tracing   Review of Systems: As per HPI otherwise 10 point review of systems negative.   Ambulatory Status: Ambulates independently. Independent with ADLs  Past Medical History:  Diagnosis Date  . Bradycardia   . Chest pain   . Constipation   . Depression   . Diverticulitis   . Diverticulosis of colon   . DOE (dyspnea on exertion)   . DVT (deep venous thrombosis) ([place])   . Esophageal stricture   . Hyperlipemia   . Hypertension   . IBS  (irritable bowel syndrome)   . Sleep apnea     Past Surgical History:  Procedure Laterality Date  . APPENDECTOMY    . BLOOD CLOT  2004   RIGHT LEG  . CHOLECYSTECTOMY    . COLON SURGERY    . GALLBLADDER SURGERY  2003  . NISSEN FUNDOPLICATION    . PLANTAR FASCIA SURGERY  2007  . sigmoid colectomy  10/21/2010   pelvic anastomosis  . VAGINAL HYSTERECTOMY      Social History   Social History  . Marital status: Legally Separated    Spouse name: N/A  . Number of children: 1  . Years of education: GED   Occupational History  . Not on file.   Social History Main Topics  . Smoking status: Never Smoker  . Smokeless tobacco: Never Used  . Alcohol use No  . Drug use: No  . Sexual activity: Not on file   Other Topics Concern  . Not on file   Social History Narrative   Epworth Sleepiness Score = 9 (03/08/2015)    Allergies  Allergen Reactions  . Dicyclomine Other (See Comments)    REACTION: Choking  . Lisinopril Other (See Comments)    REACTION: Choking  . Ace Inhibitors Other (See Comments)    Unknown to patient  . Penicillins Itching and Rash    Family History  Problem Relation Age of Onset  . Heart disease Mother   . Crohn's disease Sister   . Colon polyps Sister   . Irritable bowel syndrome Brother   . Brain cancer Father   . Colitis Sister   .  Colon cancer Neg Hx     Prior to Admission medications   Medication Sig Start Date End Date Taking? Authorizing Provider  aspirin 325 MG tablet Take 325 mg by mouth daily.   Yes Historical Provider, MD  Cholecalciferol (VITAMIN D-3 PO) Take 1 capsule by mouth daily.   Yes Historical Provider, MD  hydrochlorothiazide (HYDRODIURIL) 25 MG tablet Take 1 tablet (25 mg total) by mouth daily. 03/08/15  Yes Pixie Casino, MD  Melatonin 3 MG TABS Take 3 mg by mouth at bedtime.   Yes Historical Provider, MD  metoprolol (LOPRESSOR) 50 MG tablet Take 1 tablet (50 mg total) by mouth 2 (two) times daily. 03/08/15  Yes Pixie Casino, MD  Probiotic Product (ALIGN) 4 MG CAPS Take 4 mg by mouth daily.   Yes Historical Provider, MD    Physical Exam: Vitals:   07/02/16 1645 07/02/16 1700 07/02/16 1715 07/02/16 1730  BP: 152/62 147/70 165/80 142/60  Pulse: (!) 54 65 (!) 54 (!) 47  Resp: 16 17 19 14   Temp:      TempSrc:      SpO2: 100% 99% 100% 99%  Weight:      Height:         General:  Appears Somewhat anxious but not uncomfortable Eyes:  PERRL, EOMI, normal lids, iris ENT:  grossly normal hearing, lips & tongue, mucous membranes of her mouth are moist and pink Neck:  no LAD, masses or thyromegaly Cardiovascular:  Bradycardic but regular, no m/r/g. No LE edema. Pedal pulses present and palpable Respiratory:  CTA bilaterally, no w/r/r. Normal respiratory effort. Abdomen:  soft, ntnd, positive bowel sounds throughout no guarding or rebounding Skin:  no rash or induration seen on limited exam Musculoskeletal:  grossly normal tone BUE/BLE, good ROM, no bony abnormality Psychiatric:  grossly normal mood and affect, speech fluent and appropriate, AOx3 Neurologic:  CN 2-12 grossly intact, moves all extremities in coordinated fashion, sensation intact each clear facial symmetry  Labs on Admission: I have personally reviewed following labs and imaging studies  CBC:  Recent Labs Lab 07/02/16 1310  WBC 11.8*  HGB 14.1  HCT 40.4  MCV 88.6  PLT [id number]   Basic Metabolic Panel:  Recent Labs Lab 07/02/16 1310  NA 139  K 3.6  CL 100*  CO2 26  GLUCOSE 95  BUN 14  CREATININE 0.86  CALCIUM 9.9   GFR: Estimated Creatinine Clearance: 60.3 mL/min (by C-G formula based on SCr of 0.86 mg/dL). Liver Function Tests: No results for input(s): AST, ALT, ALKPHOS, BILITOT, PROT, ALBUMIN in the last 168 hours. No results for input(s): LIPASE, AMYLASE in the last 168 hours. No results for input(s): AMMONIA in the last 168 hours. Coagulation Profile: No results for input(s): INR, PROTIME in the last 168  hours. Cardiac Enzymes: No results for input(s): CKTOTAL, CKMB, CKMBINDEX, TROPONINI in the last 168 hours. BNP (last 3 results) No results for input(s): PROBNP in the last 8760 hours. HbA1C: No results for input(s): HGBA1C in the last 72 hours. CBG: No results for input(s): GLUCAP in the last 168 hours. Lipid Profile: No results for input(s): CHOL, HDL, LDLCALC, TRIG, CHOLHDL, LDLDIRECT in the last 72 hours. Thyroid Function Tests:  Recent Labs  07/02/16 1520  TSH 1.174   Anemia Panel: No results for input(s): VITAMINB12, FOLATE, FERRITIN, TIBC, IRON, RETICCTPCT in the last 72 hours. Urine analysis:    Component Value Date/Time   COLORURINE Yellow 11/22/2013 0741   COLORURINE AMBER BIOCHEMICALS MAY BE  AFFECTED BY COLOR (A) 08/13/2010 1052   APPEARANCEUR Clear 11/22/2013 0741   LABSPEC 1.010 11/22/2013 0741   PHURINE 7.0 11/22/2013 0741   PHURINE 6.0 08/13/2010 1052   GLUCOSEU Negative 11/22/2013 0741   GLUCOSEU NEGATIVE 08/12/2010 1641   HGBUR 1+ 11/22/2013 0741   HGBUR SMALL (A) 08/13/2010 1052   BILIRUBINUR Negative 11/22/2013 0741   KETONESUR Negative 11/22/2013 0741   KETONESUR TRACE (A) 08/13/2010 1052   PROTEINUR Negative 11/22/2013 0741   PROTEINUR 100 (A) 08/13/2010 1052   UROBILINOGEN 1.0 08/13/2010 1052   NITRITE Negative 11/22/2013 0741   NITRITE NEGATIVE 08/13/2010 1052   LEUKOCYTESUR Trace 11/22/2013 0741    Creatinine Clearance: Estimated Creatinine Clearance: 60.3 mL/min (by C-G formula based on SCr of 0.86 mg/dL).  Sepsis Labs: @LABRCNTIP (procalcitonin:4,lacticidven:4) )No results found for this or any previous visit (from the past 240 hour(s)).   Radiological Exams on Admission: Dg Chest 2 View  Result Date: 07/02/2016 CLINICAL DATA:  Chest pain and short of breath EXAM: CHEST  2 VIEW COMPARISON:  01/18/2015 FINDINGS: The heart size and mediastinal contours are within normal limits. Both lungs are clear. The visualized skeletal structures are  unremarkable. IMPRESSION: No active cardiopulmonary disease. Electronically Signed   By: Franchot Gallo M.D.   On: 07/02/2016 13:56    EKG: Independently reviewed. Sinus bradycardia Otherwise normal ECG No acute changes No significant change since last tracing   Assessment/Plan Principal Problem:   DOE (dyspnea on exertion) Active Problems:   HYPERTENSION, CONTROLLED   Diverticulosis of colon   Chest pain   Bradycardia   Anxiety   #1. Dyspnea with exertion/chest pain. Etiology unclear. Pain atypical. Heart score 4.  Chest x-ray with no active cardiopulmonary disease. EKG without acute changes. Reports history of CAD. Myoview tested 2015 No evidence of volume overload on exam. Oxygen saturation level greater than 90% on room air. BNP within the limits of normal. She is pain-free on admission. Quite anxious -Admit to telemetry -Cycle troponins -Serial EKG -Aspirin -Lipid panel -May benefit from Lexis scan stress test. -Cardiology consult  #2. Bradycardia. Home medications do include a beta blocker. Heart rate 45-55 range -Hold beta blocker for now -Obtain a TSH -Need to reduce beta blocker dose -See above  #3. Hypertension. Fair control in the emergency department.. Home medications include hydrochlorothiazide and metoprolol -Continue hydrochlorothiazide -Hold metoprolol for now -Monitor closely  #4. Anxiety. History of same. Patient reports "very stressful life". She is quite fidgety tearful verbalizes feelings of fear -Low-dose when necessary Xanax -Recommend outpatient follow-up -of note, may have been able to rule out and discharge with OP follow up but patient too anxious to leave and fearful of cardiac etiology    DVT prophylaxis: lovenox  Code Status: full  Family Communication: none present  Disposition Plan: home  Consults called: cards per ED  Admission status: obs    Radene Gunning MD [facility]  If 7PM-7AM, please contact  night-coverage [email or web address] Password [facility]  07/02/2016, 5:42 PM

## 2016-07-02 NOTE — ED Provider Notes (Signed)
[facility] DEPT Provider Note   CSN: [id number] Arrival date & time: 07/02/16  1301     History   Chief Complaint Chief Complaint  Patient presents with  . Shortness of Breath/ weakness    HPI [name] is a 66 y.o. female.  HPI Patient was sent from clinic today due to incidental heart rate in the 40s Patient states that for the last month she's been feeling short of breath with exertion Patient is on metoprolol 50mg  bid but has not had any changes to her dose Has been sleeping in recliner for 2 weeks, appears to help, intermittently Any exertion makes breathing more difficult 2/10 substernal cp, pressure, heaviness, sometimes left arm is numb  In last month, did have one episode where she felt like she was going to pass out, sat in recliner and went away  Pt states she is very stressed she needs to be here  Review records show that patient has not had an echocardiogram No cardiac disease or pulmonary disease known She does have a history of DVT  No infectious sx, fever; dry cough - chronic  15 years ago pt states she had a cath and told she had a small blockege but no stent placed No eval since  pmh of htn, no dld Mother died of chf and sibblings have chf  11/2013: "known history of coronary artery disease, hyperlipidemia, hypertension, presents with complaints of chest pain. The patient reports she is known to have history of coronary artery disease. She had cardiac catheterization done by cardiology in [facility] in 2004 where she reports she had a small vessel disease with no intervention done."  Past Medical History:  Diagnosis Date  . Bradycardia   . Chest pain   . Constipation   . Depression   . Diverticulitis   . Diverticulosis of colon   . DOE (dyspnea on exertion)   . DVT (deep venous thrombosis) ([place])   . Esophageal stricture   . Hyperlipemia   . Hypertension   . IBS (irritable bowel syndrome)   . Sleep apnea     Patient Active Problem  List   Diagnosis Date Noted  . DOE (dyspnea on exertion) 07/02/2016  . Bradycardia 07/02/2016  . Anxiety 07/02/2016  . Anxiety state   . Chest pain 03/08/2015  . Fever 08/12/2010  . ABDOMINAL PAIN, LEFT LOWER QUADRANT 06/23/2010  . DIVERTICULITIS, ACUTE 02/25/2010  . CONSTIPATION 01/22/2010  . IRRITABLE BOWEL SYNDROME 01/22/2010  . Essential hypertension 01/21/2010  . DEPRESSION 01/21/2010  . HYPERTENSION, CONTROLLED 01/21/2010  . DEEP VENOUS THROMBOPHLEBITIS 01/21/2010  . ESOPHAGEAL STRICTURE 02/29/2004  . Diverticulosis of colon 02/29/2004    Past Surgical History:  Procedure Laterality Date  . APPENDECTOMY    . BLOOD CLOT  2004   RIGHT LEG  . CHOLECYSTECTOMY    . COLON SURGERY    . GALLBLADDER SURGERY  2003  . NISSEN FUNDOPLICATION    . PLANTAR FASCIA SURGERY  2007  . sigmoid colectomy  10/21/2010   pelvic anastomosis  . VAGINAL HYSTERECTOMY      OB History    No data available       Home Medications    Prior to Admission medications   Medication Sig Start Date End Date Taking? Authorizing Provider  aspirin 325 MG tablet Take 325 mg by mouth daily.   Yes Historical Provider, MD  Cholecalciferol (VITAMIN D-3 PO) Take 1 capsule by mouth daily.   Yes Historical Provider, MD  hydrochlorothiazide (HYDRODIURIL) 25 MG  tablet Take 1 tablet (25 mg total) by mouth daily. 03/08/15  Yes Pixie Casino, MD  Melatonin 3 MG TABS Take 3 mg by mouth at bedtime.   Yes Historical Provider, MD  metoprolol (LOPRESSOR) 50 MG tablet Take 1 tablet (50 mg total) by mouth 2 (two) times daily. 03/08/15  Yes Pixie Casino, MD  Probiotic Product (ALIGN) 4 MG CAPS Take 4 mg by mouth daily.   Yes Historical Provider, MD    Family History Family History  Problem Relation Age of Onset  . Heart disease Mother   . Crohn's disease Sister   . Colon polyps Sister   . Irritable bowel syndrome Brother   . Brain cancer Father   . Colitis Sister   . Colon cancer Neg Hx     Social  History Social History  Substance Use Topics  . Smoking status: Never Smoker  . Smokeless tobacco: Never Used  . Alcohol use No     Allergies   Dicyclomine; Lisinopril; Ace inhibitors; and Penicillins   Review of Systems Review of Systems  Constitutional: Negative for fever.  Allergic/Immunologic: Negative for immunocompromised state.  All other systems reviewed and are negative.    Physical Exam Updated Vital Signs BP 141/77 (BP Location: Right Arm)   Pulse (!) 49 Comment: RN notified  Temp 99.1 F (37.3 C) (Oral)   Resp 16   Ht 5\' 3"  (1.6 m)   Wt 68 kg   SpO2 98%   BMI 26.57 kg/m   Physical Exam  Constitutional: She appears well-developed and well-nourished. No distress.  HENT:  Head: Normocephalic and atraumatic.  Eyes: Conjunctivae are normal. Right eye exhibits no discharge. Left eye exhibits no discharge.  Neck: Normal range of motion. Neck supple. No JVD present.  Cardiovascular: Regular rhythm, normal heart sounds and intact distal pulses.  Exam reveals no friction rub.   No murmur heard. bradycardia  Pulmonary/Chest: Effort normal and breath sounds normal. No respiratory distress. She has no wheezes. She has no rales.  Abdominal: Soft. Bowel sounds are normal. She exhibits no distension and no mass. There is no tenderness. There is no rebound and no guarding.  Musculoskeletal: She exhibits no edema.  Neurological: She is alert.  Skin: Skin is warm. No rash noted.  Psychiatric:  crying  Nursing note and vitals reviewed.  ED Treatments / Results  Labs (all labs ordered are listed, but only abnormal results are displayed) Labs Reviewed  BASIC METABOLIC PANEL - Abnormal; Notable for the following:       Result Value   Chloride 100 (*)    All other components within normal limits  CBC - Abnormal; Notable for the following:    WBC 11.8 (*)    All other components within normal limits  BRAIN NATRIURETIC PEPTIDE  TSH  D-DIMER, QUANTITATIVE (NOT AT  [facility])  TROPONIN I  TROPONIN I  TROPONIN I  I-STAT TROPOININ, ED    EKG  EKG Interpretation  Date/Time:  Thursday July 02 2016 13:10:19 EST Ventricular Rate:  51 PR Interval:  162 QRS Duration: 92 QT Interval:  434 QTC Calculation: 400 R Axis:   76 Text Interpretation:  Sinus bradycardia Otherwise normal ECG No acute changes No significant change since last tracing Confirmed by Kathrynn Humble, MD, Thelma Comp [phone]) on 07/02/2016 3:38:51 PM       Radiology Dg Chest 2 View  Result Date: 07/02/2016 CLINICAL DATA:  Chest pain and short of breath EXAM: CHEST  2 VIEW COMPARISON:  01/18/2015 FINDINGS:  The heart size and mediastinal contours are within normal limits. Both lungs are clear. The visualized skeletal structures are unremarkable. IMPRESSION: No active cardiopulmonary disease. Electronically Signed   By: Franchot Gallo M.D.   On: 07/02/2016 13:56    Procedures Procedures (including critical care time)  Medications Ordered in ED Medications  aspirin tablet 325 mg (not administered)  hydrochlorothiazide (HYDRODIURIL) tablet 25 mg (not administered)  acetaminophen (TYLENOL) tablet 650 mg (not administered)  ondansetron (ZOFRAN) injection 4 mg (not administered)  0.9 %  sodium chloride infusion ( Intravenous New Bag/Given 07/02/16 1823)  enoxaparin (LOVENOX) injection 40 mg (40 mg Subcutaneous Given 07/02/16 1943)  morphine 4 MG/ML injection 2 mg (not administered)  traZODone (DESYREL) tablet 50 mg (50 mg Oral Given 07/02/16 2201)  ALPRAZolam Duanne Moron) tablet 0.25 mg (0.25 mg Oral Given 07/02/16 1943)     Initial Impression / Assessment and Plan / ED Course  I have reviewed the triage vital signs and the nursing notes.  Pertinent labs & imaging results that were available during my care of the patient were reviewed by me and considered in my medical decision making (see chart for details).     Patient's history is atypical for any particular disease process However, I'm concerned  about the nature of exertional dyspnea She has no strong murmur on exam and no evidence of volume overload Her BNP is negative and her troponin and EKG show no evidence of ischemia Her labs are otherwise reassuring including a TSH She does have mild leukocytosis but chest x-ray without pneumonia and no infectious symptoms There is no evidence of a COPD exacerbation on exam Patient however states she has a history of coronary artery disease, no recent stress test or echo in over 10 years Given her age and risk factors, patient distress during evaluation regarding her symptoms, shared decision making made and patient elected to come to the hospital for inpatient monitoring and cardiac evaluation Pulmonary embolism is unlikely as low risk and d-dimer is negative Bradycardia possible from metoprolol - advice reduction for now Will admit for further evaluation  Paged cardiology but have not heard back from them prior to pt leaving ED. Hospitalist can continue to attempt as pt now inpt.  Final Clinical Impressions(s) / ED Diagnoses   Final diagnoses:  Dyspnea, unspecified type    New Prescriptions Current Discharge Medication List       Karma Greaser, MD 07/03/16 0021    Varney Biles, MD 07/03/16 [phone]

## 2016-07-02 NOTE — ED Triage Notes (Signed)
Patient reports that she was sent from her doctors office after being found to have HR of 46 in office today. Has had exertional shortness of breath for 1 month and siting in her recliner to sleep x 2 weeks. Mild chest discomfort and the shortness of breath increased with any exertion, NAD

## 2016-07-03 ENCOUNTER — Encounter (HOSPITAL_COMMUNITY): Payer: Self-pay | Admitting: Physician Assistant

## 2016-07-03 ENCOUNTER — Observation Stay (HOSPITAL_BASED_OUTPATIENT_CLINIC_OR_DEPARTMENT_OTHER): Payer: PPO

## 2016-07-03 DIAGNOSIS — R079 Chest pain, unspecified: Secondary | ICD-10-CM

## 2016-07-03 DIAGNOSIS — F329 Major depressive disorder, single episode, unspecified: Secondary | ICD-10-CM | POA: Diagnosis not present

## 2016-07-03 DIAGNOSIS — Z7982 Long term (current) use of aspirin: Secondary | ICD-10-CM | POA: Diagnosis not present

## 2016-07-03 DIAGNOSIS — Z79899 Other long term (current) drug therapy: Secondary | ICD-10-CM | POA: Diagnosis not present

## 2016-07-03 DIAGNOSIS — Z888 Allergy status to other drugs, medicaments and biological substances status: Secondary | ICD-10-CM | POA: Diagnosis not present

## 2016-07-03 DIAGNOSIS — I10 Essential (primary) hypertension: Secondary | ICD-10-CM | POA: Diagnosis not present

## 2016-07-03 DIAGNOSIS — R001 Bradycardia, unspecified: Secondary | ICD-10-CM | POA: Diagnosis not present

## 2016-07-03 DIAGNOSIS — R0789 Other chest pain: Secondary | ICD-10-CM | POA: Diagnosis not present

## 2016-07-03 DIAGNOSIS — F411 Generalized anxiety disorder: Secondary | ICD-10-CM | POA: Diagnosis not present

## 2016-07-03 DIAGNOSIS — F419 Anxiety disorder, unspecified: Secondary | ICD-10-CM | POA: Diagnosis not present

## 2016-07-03 DIAGNOSIS — R0609 Other forms of dyspnea: Secondary | ICD-10-CM

## 2016-07-03 DIAGNOSIS — Z880 Allergy status to penicillin: Secondary | ICD-10-CM | POA: Diagnosis not present

## 2016-07-03 DIAGNOSIS — R0600 Dyspnea, unspecified: Secondary | ICD-10-CM | POA: Diagnosis not present

## 2016-07-03 DIAGNOSIS — Z86718 Personal history of other venous thrombosis and embolism: Secondary | ICD-10-CM | POA: Diagnosis not present

## 2016-07-03 DIAGNOSIS — I2510 Atherosclerotic heart disease of native coronary artery without angina pectoris: Secondary | ICD-10-CM | POA: Diagnosis not present

## 2016-07-03 LAB — ECHOCARDIOGRAM COMPLETE
Height: 63 in
Weight: 2400 oz

## 2016-07-03 MED ORDER — PANTOPRAZOLE SODIUM 40 MG PO TBEC
40.0000 mg | DELAYED_RELEASE_TABLET | Freq: Every day | ORAL | Status: DC
  Administered 2016-07-04: 40 mg via ORAL
  Filled 2016-07-03: qty 1

## 2016-07-03 NOTE — Progress Notes (Signed)
  Echocardiogram 2D Echocardiogram has been performed.  Jennette Dubin 07/03/2016, 10:36 AM

## 2016-07-03 NOTE — Care Management Obs Status (Signed)
[facility] NOTIFICATION   Patient Details  Name: [name] MRN: [id number] Date of Birth: [birth year]   Medicare Observation Status Notification Given:  Yes    Dawayne Patricia, RN 07/03/2016, 4:51 PM

## 2016-07-03 NOTE — Progress Notes (Signed)
PROGRESS NOTE    [name]  [id number] DOB: [birth year] DOA: 07/02/2016 PCP: Leeroy Cha, MD   Outpatient Specialists:    Brief Narrative:  [name] is a 66 y.o. female who presents with CP and symptomatic bradycardia. Cardiology consulted.     Assessment & Plan:   Principal Problem:   DOE (dyspnea on exertion) Active Problems:   HYPERTENSION, CONTROLLED   Diverticulosis of colon   Chest pain   Bradycardia   Anxiety   Anxiety state   Dyspnea with exertion/chest pain. Etiology unclear. Pain atypical. Heart score 4.  Chest x-ray with no active cardiopulmonary disease. EKG without acute changes. Reports history of CAD. Myoview tested 2015 No evidence of volume overload on exam. Oxygen saturation level greater than 90% on room air. BNP within the limits of normal. She is pain-free on admission. Quite anxious -Admit to telemetry - troponins negative -Cardiology consult  Bradycardia. Home medications do include a beta blocker. Heart rate 45-55 range -Hold beta blocker for now -TSH WNL -monitor on tele  Hypertension. Fair control in the emergency department.. Home medications include hydrochlorothiazide and metoprolol -Continue hydrochlorothiazide -Hold metoprolol for now -Monitor closely  Anxiety. History of same. Patient reports "very stressful life". She is quite fidgety tearful verbalizes feelings of fear -Low-dose when necessary Xanax  -Recommend outpatient follow-up    DVT prophylaxis:  scd  Code Status: Full Code   Family Communication: patient  Disposition Plan:  Home after cardiology eval   Consultants:   cards     Subjective: No further chest pain No dizziness  Objective: Vitals:   07/02/16 1730 07/02/16 1829 07/02/16 2016 07/03/16 0505  BP: 142/60 (!) 151/57 (!) 113/53 (!) 110/41  Pulse: (!) 47 (!) 58 (!) 58 (!) 51  Resp: 14 18 18 18   Temp:  98.2 F (36.8 C) 98.2 F (36.8 C) 98.1 F (36.7 C)  TempSrc:   Oral Oral Oral  SpO2: 99% 100% 98% 99%  Weight:      Height:       No intake or output data in the 24 hours ending 07/03/16 0856 Filed Weights   07/02/16 1307  Weight: 68 kg (150 lb)    Examination:  General exam: Appears calm and comfortable  Respiratory system: Clear to auscultation. Respiratory effort normal. Cardiovascular system: S1 & S2 heard, RRR. No JVD, murmurs, rubs, gallops or clicks. No pedal edema. Gastrointestinal system: Abdomen is nondistended, soft and nontender. No organomegaly or masses felt. Normal bowel sounds heard.    Data Reviewed: I have personally reviewed following labs and imaging studies  CBC:  Recent Labs Lab 07/02/16 1310  WBC 11.8*  HGB 14.1  HCT 40.4  MCV 88.6  PLT [id number]   Basic Metabolic Panel:  Recent Labs Lab 07/02/16 1310  NA 139  K 3.6  CL 100*  CO2 26  GLUCOSE 95  BUN 14  CREATININE 0.86  CALCIUM 9.9   GFR: Estimated Creatinine Clearance: 60.3 mL/min (by C-G formula based on SCr of 0.86 mg/dL). Liver Function Tests: No results for input(s): AST, ALT, ALKPHOS, BILITOT, PROT, ALBUMIN in the last 168 hours. No results for input(s): LIPASE, AMYLASE in the last 168 hours. No results for input(s): AMMONIA in the last 168 hours. Coagulation Profile: No results for input(s): INR, PROTIME in the last 168 hours. Cardiac Enzymes:  Recent Labs Lab 07/02/16 1540 07/02/16 1931 07/02/16 2237  TROPONINI <0.03 <0.03 <0.03   BNP (last 3 results) No results for input(s): PROBNP in the last  8760 hours. HbA1C: No results for input(s): HGBA1C in the last 72 hours. CBG: No results for input(s): GLUCAP in the last 168 hours. Lipid Profile: No results for input(s): CHOL, HDL, LDLCALC, TRIG, CHOLHDL, LDLDIRECT in the last 72 hours. Thyroid Function Tests:  Recent Labs  07/02/16 1520  TSH 1.174   Anemia Panel: No results for input(s): VITAMINB12, FOLATE, FERRITIN, TIBC, IRON, RETICCTPCT in the last 72 hours. Urine analysis:     Component Value Date/Time   COLORURINE Yellow 11/22/2013 0741   COLORURINE AMBER BIOCHEMICALS MAY BE AFFECTED BY COLOR (A) 08/13/2010 1052   APPEARANCEUR Clear 11/22/2013 0741   LABSPEC 1.010 11/22/2013 0741   PHURINE 7.0 11/22/2013 0741   PHURINE 6.0 08/13/2010 1052   GLUCOSEU Negative 11/22/2013 0741   GLUCOSEU NEGATIVE 08/12/2010 1641   HGBUR 1+ 11/22/2013 0741   HGBUR SMALL (A) 08/13/2010 1052   BILIRUBINUR Negative 11/22/2013 0741   KETONESUR Negative 11/22/2013 0741   KETONESUR TRACE (A) 08/13/2010 1052   PROTEINUR Negative 11/22/2013 0741   PROTEINUR 100 (A) 08/13/2010 1052   UROBILINOGEN 1.0 08/13/2010 1052   NITRITE Negative 11/22/2013 0741   NITRITE NEGATIVE 08/13/2010 1052   LEUKOCYTESUR Trace 11/22/2013 0741     )No results found for this or any previous visit (from the past 240 hour(s)).    Anti-infectives    None       Radiology Studies: Dg Chest 2 View  Result Date: 07/02/2016 CLINICAL DATA:  Chest pain and short of breath EXAM: CHEST  2 VIEW COMPARISON:  01/18/2015 FINDINGS: The heart size and mediastinal contours are within normal limits. Both lungs are clear. The visualized skeletal structures are unremarkable. IMPRESSION: No active cardiopulmonary disease. Electronically Signed   By: Franchot Gallo M.D.   On: 07/02/2016 13:56        Scheduled Meds: . aspirin  325 mg Oral Daily  . enoxaparin (LOVENOX) injection  40 mg Subcutaneous Q24H  . hydrochlorothiazide  25 mg Oral Daily   Continuous Infusions:   LOS: 0 days    Time spent: 25 min    Grady, DO [facility] Pager [phone]  If 7PM-7AM, please contact night-coverage [email or web address] Password [facility] 07/03/2016, 8:56 AM

## 2016-07-03 NOTE — Consult Note (Signed)
CARDIOLOGY CONSULT NOTE   Patient ID: [name] MRN: [id number] DOB/AGE: 1950/05/30 66 y.o.  Admit date: 07/02/2016  Primary Physician   Leeroy Cha, MD Primary Cardiologist   Dr Debara Pickett, 03/08/2015 Reason for Consultation   Bradycardia Requesting MD: Dr Eliseo Squires  [id number] [name] is a 66 y.o. year old female with a history of atypical CP w/ nl stress echo 2015 at [facility], evaluated by Dr Debara Pickett 2016 w/ no further eval planned, IBS w/ constipation, GERD, HTN, HLD, OSA, depression, DVT  Pt went to MD's office and c/o DOE and CP. HR was 46. Pt sent to ER and admitted. Cards asked to see.   Pt describes a long history of worsening DOE. She has to move boxes at work and gets SOB w/ this. Sx have progressed to the point that she cannot make her bed without getting SOB. She gets LE daytime edema and her toes will feel numb and tingling.   She will wake in the night SOB up to 3 times/night. This happened the other night and she was able to sleep in the recliner.    She has chest pain, heaviness, breathing deep helps it. She has had it for years, it has progressed to the point that she has it most of the day. Has been like that for the last couple of weeks. However, she cannot remember a time when she did not have chest pain. 6/10 at its worst, when she is stressed. The stress causes more CP than exertion. However, the exertion also makes it worse. She was on omeprazole at one point, cannot remember if that helped the chest pain. Tums were not help. Tylenol taken for a HA did not change the CP.   She has a great deal of job stress, her boss is mean to her. She does not feel like she sleeps much, has been that way for a long time. However, only recently she has been waking with SOB. She also describes cotton-mouth, no one can say if she snores.   She follows her BP at home, 142/87 with HR 52 when she checked it the day before her MD visit. She remembers a HR of 45 at one point.  Her BP has generally been controlled, the higher reading was unusual.   Past Medical History:  Diagnosis Date  . Bradycardia   . Chest pain   . Constipation   . Depression   . Diverticulitis   . Diverticulosis of colon   . DOE (dyspnea on exertion)   . DVT (deep venous thrombosis) ([place])   . Esophageal stricture   . Hyperlipemia   . Hypertension   . IBS (irritable bowel syndrome)   . Sleep apnea      Past Surgical History:  Procedure Laterality Date  . APPENDECTOMY    . BLOOD CLOT  2004   RIGHT LEG  . CHOLECYSTECTOMY    . COLON SURGERY    . GALLBLADDER SURGERY  2003  . NISSEN FUNDOPLICATION    . PLANTAR FASCIA SURGERY  2007  . sigmoid colectomy  10/21/2010   pelvic anastomosis  . VAGINAL HYSTERECTOMY      Allergies  Allergen Reactions  . Dicyclomine Other (See Comments)    REACTION: Choking  . Lisinopril Other (See Comments)    REACTION: Choking  . Ace Inhibitors Other (See Comments)    Unknown to patient  . Penicillins Itching and Rash    I have reviewed the patient's current medications .  aspirin  325 mg Oral Daily  . enoxaparin (LOVENOX) injection  40 mg Subcutaneous Q24H  . hydrochlorothiazide  25 mg Oral Daily    acetaminophen, ALPRAZolam, morphine injection, ondansetron (ZOFRAN) IV, traZODone  Prior to Admission medications   Medication Sig Start Date End Date Taking? Authorizing Provider  aspirin 325 MG tablet Take 325 mg by mouth daily.   Yes Historical Provider, MD  Cholecalciferol (VITAMIN D-3 PO) Take 1 capsule by mouth daily.   Yes Historical Provider, MD  hydrochlorothiazide (HYDRODIURIL) 25 MG tablet Take 1 tablet (25 mg total) by mouth daily. 03/08/15  Yes Pixie Casino, MD  Melatonin 3 MG TABS Take 3 mg by mouth at bedtime.   Yes Historical Provider, MD  metoprolol (LOPRESSOR) 50 MG tablet Take 1 tablet (50 mg total) by mouth 2 (two) times daily. 03/08/15  Yes Pixie Casino, MD  Probiotic Product (ALIGN) 4 MG CAPS Take 4 mg by mouth  daily.   Yes Historical Provider, MD     Social History   Social History  . Marital status: Legally Separated    Spouse name: N/A  . Number of children: 1  . Years of education: GED   Occupational History  . [employer]   Social History Main Topics  . Smoking status: Never Smoker  . Smokeless tobacco: Never Used  . Alcohol use No  . Drug use: No  . Sexual activity: Not on file   Other Topics Concern  . Not on file   Social History Narrative   Epworth Sleepiness Score = 9 (03/08/2015)    Family Status  Relation Status  . Mother Deceased  . Sister Alive  . Brother Alive  . Father Deceased  . Sister   . Neg Hx    Family History  Problem Relation Age of Onset  . Heart disease Mother   . Crohn's disease Sister   . Colon polyps Sister   . Irritable bowel syndrome Brother   . Brain cancer Father   . Colitis Sister   . Colon cancer Neg Hx      ROS:  Full 14 point review of systems complete and found to be negative unless listed above.  Physical Exam: Blood pressure (!) 112/52, pulse (!) 53, temperature 98.2 F (36.8 C), temperature source Oral, resp. rate 18, height 5\' 3"  (1.6 m), weight 150 lb (68 kg), SpO2 100 %.  General: Well developed, well nourished, female in no acute distress Head: Eyes PERRLA, No xanthomas.   Normocephalic and atraumatic, oropharynx without edema or exudate. Dentition: fair Lungs: CTA bilaterally Heart: HRRR S1 S2, no rub/gallop, 2/6 murmur. pulses are 2+ all 4 extrem.   Neck: No carotid bruits. No lymphadenopathy.  JVD not elevated Abdomen: Bowel sounds present, abdomen soft and non-tender without masses or hernias noted. Msk:  No spine or cva tenderness. No weakness, no joint deformities or effusions. Extremities: No clubbing or cyanosis. No edema.  Neuro: Alert and oriented X 3. No focal deficits noted. Psych:  Good affect, responds appropriately Skin: No rashes or lesions noted.  Labs:   Lab Results  Component Value Date     WBC 11.8 (H) 07/02/2016   HGB 14.1 07/02/2016   HCT 40.4 07/02/2016   MCV 88.6 07/02/2016   PLT 281 07/02/2016     Recent Labs Lab 07/02/16 1310  NA 139  K 3.6  CL 100*  CO2 26  BUN 14  CREATININE 0.86  CALCIUM 9.9  GLUCOSE 95  Recent Labs  07/02/16 1540 07/02/16 1931 07/02/16 2237  TROPONINI <0.03 <0.03 <0.03    Recent Labs  07/02/16 1348  TROPIPOC 0.00   B Natriuretic Peptide  Date/Time Value Ref Range Status  07/02/2016 01:10 PM 81.1 0.0 - 100.0 pg/mL Final   Lab Results  Component Value Date   CHOL 209 (H) 11/22/2013   HDL 56 11/22/2013   LDLCALC 133 (H) 11/22/2013   TRIG 99 11/22/2013   Lab Results  Component Value Date   DDIMER 0.40 07/02/2016   TSH  Date/Time Value Ref Range Status  07/02/2016 03:20 PM 1.174 0.350 - 4.500 uIU/mL Final    Comment:    Performed by a 3rd Generation assay with a functional sensitivity of <=0.01 uIU/mL.    Echo: (07/03/2016 pending) 07/17/2008 SUMMARY - Overall left ventricular systolic function was normal. Left    ventricular ejection fraction was estimated to be 70 %. There    were no left ventricular regional wall motion abnormalities.    There was mild focal basal septal hypertrophywithout SAM or    obstruction. - There was mild to moderate mitral valvular regurgitation. - Left atrial size was at the upper limits of normal.  ECG:  07/03/2016 Sinus brady, HR 51 No acute changes, normal intervals  Cath: n/a  Radiology:  Dg Chest 2 View Result Date: 07/02/2016 CLINICAL DATA:  Chest pain and short of breath EXAM: CHEST  2 VIEW COMPARISON:  01/18/2015 FINDINGS: The heart size and mediastinal contours are within normal limits. Both lungs are clear. The visualized skeletal structures are unremarkable. IMPRESSION: No active cardiopulmonary disease. Electronically Signed   By: Franchot Gallo M.D.   On: 07/02/2016 13:56    ASSESSMENT AND PLAN:   The patient was seen today by Dr Harrington Challenger,  the patient evaluated and the data reviewed.   Principal Problem:   DOE (dyspnea on exertion) - unclear if some of this is related to bradycardia - no volume overload by exam - BNP normal  - echo ordered, f/u on results. - see how DOE changes w/ improvement in HR  Active Problems:   HYPERTENSION, CONTROLLED - metoprolol d/c'd, HR and BP should go up - if better BP control needed, could add lisinopril    Diverticulosis of colon - per IM    Chest pain - Pt has been having CP continuously for months.  - ez neg MI and ECG not acute.  - echo stress test in 2015 was normal - consider a treadmill with either echo or MV  - hx GERD but Tums did not change pain - not sure if PPI would help.    Bradycardia - should improve off BB - once BB has worn off, then can do GXT.    Anxiety   Anxiety state - may be what is driving her sx - per IM   Signed: Lenoard Aden 07/03/2016 1:56 PM Beeper [id number]  Pateint seen and examined  She is a 66 yo with history of GERD  Presents with chest pressure  Pt says it has been going on for awhile  Just got insurance  Alos notes SOB with activity (like making beds)  Has had spells of PND over past couple weeks  Anxous  She also has a long history of GERD  Has Hiatal hernia  Had surgery  Taking somenig for acid  Not sure what   Pt says she is having some mild tightness as I talk to her  Constant ON exam  Neck  JVP normal  Lungs CTA  Cardiac RRR  II/VI systolic murmur LSB  Abd benign  Ext 2+ pulses  EKG without acute changes   Sinus brady Echo report shows mod LVH   Gr II diastolic dysfunction  WIll review  Normal LVEF  I am not convinced chest tightness due to coronary ischemia  It is constant  Having now  ? GI  SOB is more concerning  May be related to diastolic dysfunction  ? Outflow obstruction  I would recomm empiric PPI   I would recomm stress echo if possible to eval HR/BP respnse, EKG and echo response , incluiding gradient through  LVOT after exertion. She does not appear to be volume overloaded on exam  ? Though if fillng pressures increased   Follow for now  Test in AM   [facility]

## 2016-07-04 ENCOUNTER — Encounter (HOSPITAL_COMMUNITY): Payer: Self-pay | Admitting: Radiology

## 2016-07-04 ENCOUNTER — Observation Stay (HOSPITAL_BASED_OUTPATIENT_CLINIC_OR_DEPARTMENT_OTHER): Payer: PPO

## 2016-07-04 DIAGNOSIS — R079 Chest pain, unspecified: Secondary | ICD-10-CM | POA: Diagnosis not present

## 2016-07-04 DIAGNOSIS — R0609 Other forms of dyspnea: Secondary | ICD-10-CM | POA: Diagnosis not present

## 2016-07-04 DIAGNOSIS — R0600 Dyspnea, unspecified: Secondary | ICD-10-CM | POA: Diagnosis not present

## 2016-07-04 LAB — NM MYOCAR MULTI W/PLANAR W/WALL MOTION / EF
Estimated workload: 6.4 METS
Exercise duration (min): 4 min
Exercise duration (sec): 33 s
MPHR: 155 {beats}/min
Peak HR: 146 {beats}/min
Percent HR: 94 %
RPE: 16
Rest HR: 69 {beats}/min

## 2016-07-04 MED ORDER — PANTOPRAZOLE SODIUM 40 MG PO TBEC: 40.0000 mg | DELAYED_RELEASE_TABLET | Freq: Every day | ORAL | 0 refills | Status: DC

## 2016-07-04 MED ORDER — TRAZODONE HCL 50 MG PO TABS: 50.0000 mg | ORAL_TABLET | Freq: Every evening | ORAL | 0 refills | Status: DC | PRN

## 2016-07-04 MED ORDER — TECHNETIUM TC 99M TETROFOSMIN IV KIT
10.0000 | PACK | Freq: Once | INTRAVENOUS | Status: AC | PRN
  Administered 2016-07-04: 10 via INTRAVENOUS

## 2016-07-04 MED ORDER — TECHNETIUM TC 99M TETROFOSMIN IV KIT
30.0000 | PACK | Freq: Once | INTRAVENOUS | Status: AC | PRN
Start: 2016-07-04 — End: 2016-07-04
  Administered 2016-07-04: 30 via INTRAVENOUS

## 2016-07-04 NOTE — Progress Notes (Deleted)
Progress Note  Patient Name: [name] Date of Encounter: 07/04/2016  Primary Cardiologist: Hilty  Subjective   Still with chest pain  Inpatient Medications    Scheduled Meds: . aspirin  325 mg Oral Daily  . enoxaparin (LOVENOX) injection  40 mg Subcutaneous Q24H  . hydrochlorothiazide  25 mg Oral Daily  . pantoprazole  40 mg Oral Q1200   Continuous Infusions:  PRN Meds: acetaminophen, ALPRAZolam, morphine injection, ondansetron (ZOFRAN) IV, traZODone   Vital Signs    Vitals:   07/03/16 1556 07/03/16 2038 07/04/16 0515 07/04/16 0854  BP: (!) 129/53 (!) 104/50 (!) 119/58 (!) 141/74  Pulse: 67 63 (!) 57 70  Resp: 18 20 20    Temp: 98.2 F (36.8 C) 98.2 F (36.8 C) 97.6 F (36.4 C)   TempSrc: Oral Oral Oral   SpO2: 98% 99% 97%   Weight:      Height:        Intake/Output Summary (Last 24 hours) at 07/04/16 0919 Last data filed at 07/03/16 1700  Gross per 24 hour  Intake              480 ml  Output                0 ml  Net              480 ml   Filed Weights   07/02/16 1307  Weight: 150 lb (68 kg)    Telemetry    SR - Personally Reviewed  ECG    SR no new since yesterday - Personally Reviewed  Physical Exam   GEN: No acute distress.   Neck: No JVD Cardiac: RRR, no murmurs, rubs, or gallops. + chest pain increase with palpation of mid sternal  Respiratory: Clear to auscultation bilaterally. GI: Soft, nontender, non-distended  MS: No edema; No deformity. Neuro:  Nonfocal  Psych: Normal affect   Labs    Chemistry Recent Labs Lab 07/02/16 1310  NA 139  K 3.6  CL 100*  CO2 26  GLUCOSE 95  BUN 14  CREATININE 0.86  CALCIUM 9.9  GFRNONAA >60  GFRAA >60  ANIONGAP 13     Hematology Recent Labs Lab 07/02/16 1310  WBC 11.8*  RBC 4.56  HGB 14.1  HCT 40.4  MCV 88.6  MCH 30.9  MCHC 34.9  RDW 14.1  PLT 281    Cardiac Enzymes Recent Labs Lab 07/02/16 1540 07/02/16 1931 07/02/16 2237  TROPONINI <0.03 <0.03 <0.03      Recent Labs Lab 07/02/16 1348  TROPIPOC 0.00     BNP Recent Labs Lab 07/02/16 1310  BNP 81.1     DDimer  Recent Labs Lab 07/02/16 1547  DDIMER 0.40     Radiology    Dg Chest 2 View  Result Date: 07/02/2016 CLINICAL DATA:  Chest pain and short of breath EXAM: CHEST  2 VIEW COMPARISON:  01/18/2015 FINDINGS: The heart size and mediastinal contours are within normal limits. Both lungs are clear. The visualized skeletal structures are unremarkable. IMPRESSION: No active cardiopulmonary disease. Electronically Signed   By: Franchot Gallo M.D.   On: 07/02/2016 13:56    Cardiac Studies   nuc study  ECHO: Study Conclusions  - Left ventricle: The cavity size was normal. There was severe   focal basal hypertrophy. Systolic function was vigorous. The   estimated ejection fraction was in the range of 65% to 70%. Wall   motion was normal; there were no regional wall  motion   abnormalities. Features are consistent with a pseudonormal left   ventricular filling pattern, with concomitant abnormal relaxation   and increased filling pressure (grade 2 diastolic dysfunction). - Aortic valve: Trileaflet; normal thickness, mildly calcified   leaflets. There was trivial regurgitation. - Mitral valve: There was mild regurgitation.  Patient Profile     66 y.o. female with a history of atypical CP w/ nl stress echo 2015 at [facility], evaluated by Dr Debara Pickett 2016 w/ no further eval planned, IBS w/ constipation, GERD, HTN, HLD, OSA, depression, DVT.  Non presents with a chronic chest pain.  Assessment & Plan    Chest pain with neg enzymes. For nuc study  HTN   DOE with normal EF but LVH and G2DD.   Signed, Cecilie Kicks, NP  07/04/2016, 9:19 AM

## 2016-07-04 NOTE — Progress Notes (Signed)
Patient in a stable condition, discharge education completed , patient verbalized understanding, iv removed, tele dc ccmd notified, patient belongings at bedside, patients significant other to take patient home.

## 2016-07-04 NOTE — Progress Notes (Addendum)
    Dyspnea/Chest pain - fairly atypical chest pain symptoms.  - echo this admit LVEF 65-70%, no WMAs, grade II diastolic dysfunction. Trivial AI, mild MR. Severe focal basal hypetrophy without SAM or gradient described.  - troponins negative, EKG sinus brady no ischemic changes - nuclear stress test is negative. - no plans for additional inpatient testing at this time.  -  can consider outpatient repeat echo with stress or provocative maneuvers to see if dynamic gradient is present in setting of severe basal septal hypertrophy.   2. Bradycardia - was on lopressor 50mg  bid at home - on hold at this time. Heart rates have normalized.  3. HTN - elevated bp's off lopressor. Would recommend lisinopril 10mg  daily.   No further inpatient recs at this time. We will arrange outpatient follow up with cardiology    Carlyle Dolly, M.D.

## 2016-07-04 NOTE — Progress Notes (Signed)
exercise portion of study completed.

## 2016-07-08 NOTE — Discharge Summary (Signed)
[facility] Discharge Summary   Patient: [name] [id number]   PCP: Leeroy Cha, MD DOB: 11-Jul-1950   Date of admission: 07/02/2016   Date of discharge: 07/04/2016    Discharge Diagnoses:  Principal Problem:   DOE (dyspnea on exertion) Active Problems:   HYPERTENSION, CONTROLLED   Diverticulosis of colon   Chest pain   Bradycardia   Anxiety   Anxiety state  Admitted From: home Disposition:  home  Recommendations for Outpatient Follow-up:  1. Please follow up with cardiology as recommended  2. Please follow up with PCP in 1 week  Follow-up [place], MD Follow up.   Specialty:  Cardiology Why:  follow up with Dr. Debara Pickett or his PA, NP in 2 week the office should call Monday or Tuesday for appt date and time, if you do not hear from them please call.  Contact information: [place] [address] [phone]        Leeroy Cha, MD. Schedule an appointment as soon as possible for a visit in 2 week(s).   Specialty:  Internal Medicine Contact information: [address] [place]  [phone]          Diet recommendation: cardiac diet  Activity: The patient is advised to gradually reintroduce usual activities.  Discharge Condition: good  Code Status: full code  History of present illness: As per the H and P dictated on admission, "[name] is a 66 y.o. female with medical history significant for irritable bowel syndrome, ?CAD, hypertension, hyperlipidemia presents to the emergency department from the doctor's office after having found to have a heart rate of 46. Also complains of dyspnea with exertion and chest pain. Being admitted for chest pain rule out and evaluation of Bradycardia and chest pain  Information is obtained from patient. She states she was at her doctor's office today for routine visit but over the last month she is just "not felt quite  right". She complains of dyspnea with mild exertion such as household chores mopping the floors change in the she's on the bed. Associated symptoms include intermittent chest pain described as a pressure located left anterior with radiating to the arm. She also reports intermittent lower extremity edema mostly at the end of the day. She denies headache dizziness syncope or near-syncope. She denies palpitations abdominal pain nausea vomiting diaphoresis. She denies dysuria hematuria frequency or urgency. She denies fever chills recent travel or sick contacts. Denies any recent change in her medications and reports compliance with her medications"  Hospital Course:   Summary of her active problems in the hospital is as following. 1. Dyspnea with exertion/chest pain. Etiology unclear. Pain atypical. Heart score 4.   Chest x-ray with no active cardiopulmonary disease.  EKG without acute changes.  Reports history of CAD. Continue Aspirin, negative stress test and echocardiogram. Cardiology consulted and no further plan recommended at present. PPI ordered.  2. Bradycardia.  Resolved and not symptomatic. Continue home regimen  3. Hypertension. -Continue home medication  4. Anxiety. Trazodone at present  All other chronic medical condition were stable during the hospitalization.  Patient was ambulatory without any assistance. On the day of the discharge the patient's vitals were stable, and no other acute medical condition were reported by patient. the patient was felt safe to be discharge at home with family.  Procedures and Results:  Echocardiogram  Study Conclusions  - Left ventricle: The cavity size was normal. There  was severe   focal basal hypertrophy. Systolic function was vigorous. The   estimated ejection fraction was in the range of 65% to 70%. Wall   motion was normal; there were no regional wall motion   abnormalities. Features are consistent with a pseudonormal left    ventricular filling pattern, with concomitant abnormal relaxation   and increased filling pressure (grade 2 diastolic dysfunction). - Aortic valve: Trileaflet; normal thickness, mildly calcified   leaflets. There was trivial regurgitation. - Mitral valve: There was mild regurgitation.  NM Myocar Multi W/Planar W/Wall Motion / EF IMPRESSION: 1. No reversible ischemia or infarction.  2. Normal left ventricular wall motion.  3. Left ventricular ejection fraction 77%  4. Non invasive risk stratification*: Low  Consultations:  Cardiology  DISCHARGE MEDICATION: Discharge Medication List as of 07/04/2016  4:30 PM    START taking these medications   Details  pantoprazole (PROTONIX) 40 MG tablet Take 1 tablet (40 mg total) by mouth daily before breakfast., Starting Sat 07/04/2016, Normal    traZODone (DESYREL) 50 MG tablet Take 1 tablet (50 mg total) by mouth at bedtime as needed for sleep., Starting Sat 07/04/2016, Normal      CONTINUE these medications which have NOT CHANGED   Details  aspirin 325 MG tablet Take 325 mg by mouth daily., Historical Med    Cholecalciferol (VITAMIN D-3 PO) Take 1 capsule by mouth daily., Historical Med    hydrochlorothiazide (HYDRODIURIL) 25 MG tablet Take 1 tablet (25 mg total) by mouth daily., Starting Fri 03/08/2015, Normal    Melatonin 3 MG TABS Take 3 mg by mouth at bedtime., Historical Med    metoprolol (LOPRESSOR) 50 MG tablet Take 1 tablet (50 mg total) by mouth 2 (two) times daily., Starting Fri 03/08/2015, Normal    Probiotic Product (ALIGN) 4 MG CAPS Take 4 mg by mouth daily., Historical Med       Allergies  Allergen Reactions  . Dicyclomine Other (See Comments)    REACTION: Choking  . Lisinopril Other (See Comments)    REACTION: Choking  . Ace Inhibitors Other (See Comments)    Unknown to patient  . Penicillins Itching and Rash   Discharge Instructions    Diet - low sodium heart healthy    Complete by:  As directed     Discharge instructions    Complete by:  As directed    It is important that you read following instructions as well as go over your medication list with RN to help you understand your care after this hospitalization.  Discharge Instructions: Please follow-up with PCP in one week  Please request your primary care physician to go over all Hospital Tests and Procedure/Radiological results at the follow up,  Please get all Hospital records sent to your PCP by signing hospital release before you go home.   Do not take more than prescribed Pain, Sleep and Anxiety Medications. You were cared for by a hospitalist during your hospital stay. If you have any questions about your discharge medications or the care you received while you were in the hospital after you are discharged, you can call the unit and ask to speak with the hospitalist on call if the hospitalist that took care of you is not available.  Once you are discharged, your primary care physician will handle any further medical issues. Please note that NO REFILLS for any discharge medications will be authorized once you are discharged, as it is imperative that you return to your primary care physician (  or establish a relationship with a primary care physician if you do not have one) for your aftercare needs so that they can reassess your need for medications and monitor your lab values. You Must read complete instructions/literature along with all the possible adverse reactions/side effects for all the Medicines you take and that have been prescribed to you. Take any new Medicines after you have completely understood and accept all the possible adverse reactions/side effects. Wear Seat belts while driving. If you have smoked or chewed Tobacco in the last 2 yrs please stop smoking and/or stop any Recreational drug use.   Increase activity slowly    Complete by:  As directed      Discharge Exam: Filed Weights   07/02/16 1307  Weight: 68 kg  (150 lb)   Vitals:   07/04/16 1046 07/04/16 1312  BP: (!) 119/52 (!) 143/61  Pulse: 72 81  Resp: 18 20  Temp: 97.8 F (36.6 C) 97.7 F (36.5 C)   General: Appear in no distress, no Rash; Oral Mucosa moist. Cardiovascular: S1 and S2 Present, no Murmur, no JVD Respiratory: Bilateral Air entry present and Clear to Auscultation, no Crackles, no wheezes Abdomen: Bowel Sound present, Soft and no tenderness Extremities: no Pedal edema, no calf tenderness Neurology: Grossly no focal neuro deficit.  The results of significant diagnostics from this hospitalization (including imaging, microbiology, ancillary and laboratory) are listed below for reference.    Significant Diagnostic Studies: Dg Chest 2 View  Result Date: 07/02/2016 CLINICAL DATA:  Chest pain and short of breath EXAM: CHEST  2 VIEW COMPARISON:  01/18/2015 FINDINGS: The heart size and mediastinal contours are within normal limits. Both lungs are clear. The visualized skeletal structures are unremarkable. IMPRESSION: No active cardiopulmonary disease. Electronically Signed   By: Franchot Gallo M.D.   On: 07/02/2016 13:56   Nm Myocar Multi W/planar W/wall Motion / Ef  Result Date: 07/04/2016 CLINICAL DATA:  Chest pain. Dyspnea. Hyperlipidemia. Hypertension. EXAM: MYOCARDIAL IMAGING WITH SPECT (REST AND EXERCISE) GATED LEFT VENTRICULAR WALL MOTION STUDY LEFT VENTRICULAR EJECTION FRACTION TECHNIQUE: Standard myocardial SPECT imaging was performed after resting intravenous injection of 10 mCi Tc-12m tetrofosmin. Subsequently, exercise tolerance test was performed by the patient under the supervision of the Cardiology staff. At peak-stress, 30 mCi Tc-42m tetrofosmin was injected intravenously and standard myocardial SPECT imaging was performed. Quantitative gated imaging was also performed to evaluate left ventricular wall motion, and estimate left ventricular ejection fraction. COMPARISON:  None. FINDINGS: Perfusion: No decreased activity in  the left ventricle on stress imaging to suggest reversible ischemia or infarction. Wall Motion: Normal left ventricular wall motion. No left ventricular dilation. Left Ventricular Ejection Fraction: 77 % End diastolic volume 50 ml End systolic volume 11 ml IMPRESSION: 1. No reversible ischemia or infarction. 2. Normal left ventricular wall motion. 3. Left ventricular ejection fraction 77% 4. Non invasive risk stratification*: Low *2012 Appropriate Use Criteria for Coronary Revascularization Focused Update: J Am Coll Cardiol. [id number]. [email or web address]://[email or web address]=[email or web address] Electronically Signed   By: Earle Gell M.D.   On: 07/04/2016 13:31    Microbiology: No results found for this or any previous visit (from the past 240 hour(s)).   Labs: CBC:  Recent Labs Lab 07/02/16 1310  WBC 11.8*  HGB 14.1  HCT 40.4  MCV 88.6  PLT [id number]   Basic Metabolic Panel:  Recent Labs Lab 07/02/16 1310  NA 139  K 3.6  CL 100*  CO2 26  GLUCOSE 95  BUN 14  CREATININE 0.86  CALCIUM  9.9   Cardiac Enzymes:  Recent Labs Lab 07/02/16 1540 07/02/16 1931 07/02/16 2237  TROPONINI <0.03 <0.03 <0.03   BNP (last 3 results)  Recent Labs  07/02/16 1310  BNP 81.1   CBG: No results for input(s): GLUCAP in the last 168 hours. Time spent: 30 minutes  Signed:  Berle Mull  [facility] 07/04/2016, 3:21 PM

## 2016-07-13 DIAGNOSIS — R51 Headache: Secondary | ICD-10-CM | POA: Diagnosis not present

## 2016-07-13 DIAGNOSIS — R001 Bradycardia, unspecified: Secondary | ICD-10-CM | POA: Diagnosis not present

## 2016-07-13 DIAGNOSIS — F4322 Adjustment disorder with anxiety: Secondary | ICD-10-CM | POA: Diagnosis not present

## 2016-07-13 DIAGNOSIS — I10 Essential (primary) hypertension: Secondary | ICD-10-CM | POA: Diagnosis not present

## 2016-07-13 DIAGNOSIS — K219 Gastro-esophageal reflux disease without esophagitis: Secondary | ICD-10-CM | POA: Diagnosis not present

## 2016-07-28 ENCOUNTER — Ambulatory Visit (INDEPENDENT_AMBULATORY_CARE_PROVIDER_SITE_OTHER): Payer: PPO | Admitting: Internal Medicine

## 2016-07-28 ENCOUNTER — Encounter: Payer: Self-pay | Admitting: Internal Medicine

## 2016-07-28 VITALS — BP 168/72 | HR 52 | Ht 63.0 in | Wt 151.0 lb

## 2016-07-28 DIAGNOSIS — R0609 Other forms of dyspnea: Secondary | ICD-10-CM | POA: Diagnosis not present

## 2016-07-28 DIAGNOSIS — Z13220 Encounter for screening for lipoid disorders: Secondary | ICD-10-CM | POA: Diagnosis not present

## 2016-07-28 DIAGNOSIS — I422 Other hypertrophic cardiomyopathy: Secondary | ICD-10-CM | POA: Diagnosis not present

## 2016-07-28 DIAGNOSIS — I10 Essential (primary) hypertension: Secondary | ICD-10-CM | POA: Diagnosis not present

## 2016-07-28 DIAGNOSIS — Z79899 Other long term (current) drug therapy: Secondary | ICD-10-CM | POA: Diagnosis not present

## 2016-07-28 DIAGNOSIS — R0600 Dyspnea, unspecified: Secondary | ICD-10-CM

## 2016-07-28 LAB — LIPID PANEL
Cholesterol: 224 mg/dL — ABNORMAL HIGH (ref <200)
HDL: 57 mg/dL (ref >50)
LDL Cholesterol: 135 mg/dL — ABNORMAL HIGH (ref <100)
Total CHOL/HDL Ratio: 3.9 Ratio (ref <5.0)
Triglycerides: 162 mg/dL — ABNORMAL HIGH (ref <150)
VLDL: 32 mg/dL — ABNORMAL HIGH (ref <30)

## 2016-07-28 MED ORDER — LOSARTAN POTASSIUM 25 MG PO TABS: 25.0000 mg | ORAL_TABLET | Freq: Every day | ORAL | 3 refills | Status: DC

## 2016-07-28 MED ORDER — METOPROLOL TARTRATE 25 MG PO TABS: 25.0000 mg | ORAL_TABLET | Freq: Two times a day (BID) | ORAL | 3 refills | Status: DC

## 2016-07-28 NOTE — Progress Notes (Signed)
OFFICE NOTE  Chief Complaint:  Follow-up bradycardia  Primary Care Physician: Leeroy Cha, MD  HPI:  [name] is a 66 year old female who recently lost her insurance and has not followed up with her primary care provider, Dr. Emily Filbert in [place]. She recently presented to [facility] with left arm heaviness and numbness and chest discomfort. She was diagnosed with complex migraine. There was no specific headache during this episode she does not have a headache history. On September 9 she felt like she was having a heart attack. She had some heaviness in her chest and shortness of breath. EKG showed no significant changes. Apparently she had had a stress test at [facility] in 2015 which was negative for ischemia. She does have a history of sleep apnea in the past. She also has anxiety but is not currently taking medicine for that and hypertension. She reports being close to running out of her medications and does not have a primary care physician.  07/28/2016  [name] returns today for follow-up. She was recently in the hospital was noted to be bradycardic with atypical chest pain. Was thought to be reflux and she was started on PPI. She still feels tightness and heaviness in her chest. She underwent a nuclear stress test which was low risk with normal LV systolic function. She ruled out for MI by negative troponins. She did have a heart catheterization in 2004 and brought the images with her today. This showed essentially normal coronaries. An echocardiogram was performed due to a systolic murmur and she was found to have severe basal septal hypertrophy probably consistent with hypertrophic cardiomyopathy. Is not clear if she is more symptomatically with exertion or not. The outflow tract gradient was not measured and Valsalva was not performed. She does have some lability to her blood pressure is noted that she is symptomatic when her heart rate is in the  50s feeling worse than she does when it's higher, although this is somewhat counterintuitive as the goal is to lower heart rate with hypertrophic cardiomyopathy. There is no mention of rest obstruction on her echo. She did report that her mom died with congestive heart failure may have had structural heart disease. She is also reporting some numbness and tingling in her feet although has good distal pulses and warm extremities.  PMHx:  Past Medical History:  Diagnosis Date  . Bradycardia   . Chest pain   . Constipation   . Depression   . Diverticulitis   . Diverticulosis of colon   . DOE (dyspnea on exertion)   . DVT (deep venous thrombosis) ([place])   . Esophageal stricture   . Hyperlipemia   . Hypertension   . IBS (irritable bowel syndrome)   . Sleep apnea     Past Surgical History:  Procedure Laterality Date  . APPENDECTOMY    . BLOOD CLOT  2004   RIGHT LEG  . CHOLECYSTECTOMY    . COLON SURGERY    . GALLBLADDER SURGERY  2003  . NISSEN FUNDOPLICATION    . PLANTAR FASCIA SURGERY  2007  . sigmoid colectomy  10/21/2010   pelvic anastomosis  . VAGINAL HYSTERECTOMY      FAMHx:  Family History  Problem Relation Age of Onset  . Heart disease Mother   . Crohn's disease Sister   . Colon polyps Sister   . Irritable bowel syndrome Brother   . Brain cancer Father   . Colitis Sister   . Colon  cancer Neg Hx     SOCHx:   reports that she has never smoked. She has never used smokeless tobacco. She reports that she does not drink alcohol or use drugs.  ALLERGIES:  Allergies  Allergen Reactions  . Dicyclomine Other (See Comments)    REACTION: Choking  . Lisinopril Other (See Comments)    REACTION: Choking  . Ace Inhibitors Other (See Comments)    Unknown to patient  . Penicillins Itching and Rash    ROS: Pertinent items noted in HPI and remainder of comprehensive ROS otherwise negative.  HOME MEDS: Current Outpatient Prescriptions  Medication Sig Dispense Refill  .  aspirin 325 MG tablet Take 325 mg by mouth daily.    . Cholecalciferol (VITAMIN D-3 PO) Take 1 capsule by mouth daily.    . hydrochlorothiazide (HYDRODIURIL) 25 MG tablet Take 1 tablet (25 mg total) by mouth daily. 30 tablet 3  . metoprolol (LOPRESSOR) 50 MG tablet Take 1 tablet (50 mg total) by mouth 2 (two) times daily. (Patient taking differently: Take 50 mg by mouth daily. ) 60 tablet 3  . pantoprazole (PROTONIX) 40 MG tablet Take 1 tablet (40 mg total) by mouth daily before breakfast. 30 tablet 0  . Probiotic Product (ALIGN) 4 MG CAPS Take 4 mg by mouth daily.    . traZODone (DESYREL) 50 MG tablet Take 1 tablet (50 mg total) by mouth at bedtime as needed for sleep. 5 tablet 0   No current facility-administered medications for this visit.     LABS/IMAGING: No results found for this or any previous visit (from the past 48 hour(s)). No results found.  WEIGHTS: Wt Readings from Last 3 Encounters:  07/28/16 151 lb (68.5 kg)  07/02/16 150 lb (68 kg)  03/08/15 151 lb (68.5 kg)    VITALS: BP (!) 168/72   Pulse (!) 52   Ht 5\' 3"  (1.6 m)   Wt 151 lb (68.5 kg)   BMI 26.75 kg/m   EXAM: General appearance: alert and no distress Neck: no carotid bruit and no JVD Lungs: clear to auscultation bilaterally Heart: regular rate and rhythm, S1, S2 normal, no murmur, click, rub or gallop Abdomen: soft, non-tender; bowel sounds normal; no masses,  no organomegaly Extremities: extremities normal, atraumatic, no cyanosis or edema Pulses: 2+ and symmetric Skin: Skin color, texture, turgor normal. No rashes or lesions Neurologic: Grossly normal Psych: Pleasant  EKG: EKG reviewed from PCP on 07/02/16 demonstrating sinus bradycardia at 46  ASSESSMENT: 1. Atypical chest pain - negative Myoview 06/2016-LVEF 77% 2. Echo suggestive of hypertrophic cardiomyopathy - LVEF 65-70%, severe focal basal hypertrophy (1.5 cm) and grade 2 diastolic dysfunction. 3. Hypertension-controlled 4. Anxiety 5. Toe  numbness- suspect peripheral neuropathy  PLAN: 1.   [name] continues to have some symptoms of chest heaviness which is present consistently and may be worse with exertion as well as some shortness of breath. Her stress test was negative and her echo suggested variant of hypertrophic cardiomyopathy without obvious obstruction at rest. I would recommend an exercise stress echo to measure her LVOT gradient with exercise. In addition, she seems to be symptomatic with the cardia in the 51s and 50s at times. Unfortunately we'll have to back off on her metoprolol to 25 mg twice a day. We'll start losartan 25 mg daily for elevated blood pressure and cardiomyopathy benefit. She has requested a screening lipid profile today and will be happy to do that as well.  Follow-up with me in 1 month.  Chrissie Noa  C. Debara Pickett, MD, [facility] Attending Cardiologist [place] 07/28/2016, 8:48 AM

## 2016-07-28 NOTE — Patient Instructions (Signed)
Medication Instructions:  Metoprolol has decreased to 25 mg twice a day Start taking Losartan 25 mg once a day   Labwork:  Please have a Lipid panel test done. You will need to fast for this test. Nothing after midnight the night before.   Procedures/Testing: Your physician has requested that you have a stress echocardiogram. For further information please visit [email or web address]. Please follow instruction sheet as given. This is done at [address] third floor   Follow-Up: Your physician recommends that you schedule a follow-up appointment in: [place] [address] [place]IL[place]   If you need a refill on your cardiac medications before your next appointment, please call your pharmacy.

## 2016-08-05 ENCOUNTER — Telehealth (HOSPITAL_COMMUNITY): Payer: Self-pay | Admitting: *Deleted

## 2016-08-05 NOTE — Telephone Encounter (Signed)
Patient given detailed instructions per Stress Test Requisition Sheet for test on 08/13/16 at 7:30.Patient Notified to arrive 30 minutes early, and that it is imperative to arrive on time for appointment to keep from having the test rescheduled.  Patient verbalized understanding. [name]

## 2016-08-11 ENCOUNTER — Telehealth: Payer: Self-pay | Admitting: Internal Medicine

## 2016-08-11 NOTE — Telephone Encounter (Signed)
Unable to locate who called patient or what in regards to-called patient left message. Advised to call back with further questions or concerns.  Apologized for the inconvenience.

## 2016-08-11 NOTE — Telephone Encounter (Signed)
New message      Pt states that she is returning a call to the nurse.  Not sure who called.

## 2016-08-12 ENCOUNTER — Ambulatory Visit (HOSPITAL_COMMUNITY): Payer: PPO

## 2016-08-12 ENCOUNTER — Ambulatory Visit (HOSPITAL_COMMUNITY): Payer: PPO | Attending: Internal Medicine

## 2016-08-12 DIAGNOSIS — I10 Essential (primary) hypertension: Secondary | ICD-10-CM | POA: Insufficient documentation

## 2016-08-12 DIAGNOSIS — I422 Other hypertrophic cardiomyopathy: Secondary | ICD-10-CM | POA: Diagnosis not present

## 2016-08-12 DIAGNOSIS — I493 Ventricular premature depolarization: Secondary | ICD-10-CM | POA: Diagnosis not present

## 2016-08-13 ENCOUNTER — Other Ambulatory Visit (HOSPITAL_COMMUNITY): Payer: PPO

## 2016-08-21 ENCOUNTER — Ambulatory Visit: Payer: PPO | Admitting: Internal Medicine

## 2016-09-07 ENCOUNTER — Ambulatory Visit: Payer: PPO | Admitting: Internal Medicine

## 2016-09-14 DIAGNOSIS — I10 Essential (primary) hypertension: Secondary | ICD-10-CM | POA: Diagnosis not present

## 2016-09-14 DIAGNOSIS — R400 Somnolence: Secondary | ICD-10-CM | POA: Diagnosis not present

## 2016-09-14 DIAGNOSIS — Z1231 Encounter for screening mammogram for malignant neoplasm of breast: Secondary | ICD-10-CM | POA: Diagnosis not present

## 2016-09-14 DIAGNOSIS — R05 Cough: Secondary | ICD-10-CM | POA: Diagnosis not present

## 2016-09-14 DIAGNOSIS — K219 Gastro-esophageal reflux disease without esophagitis: Secondary | ICD-10-CM | POA: Diagnosis not present

## 2016-09-14 DIAGNOSIS — F4322 Adjustment disorder with anxiety: Secondary | ICD-10-CM | POA: Diagnosis not present

## 2016-10-16 DIAGNOSIS — F5101 Primary insomnia: Secondary | ICD-10-CM | POA: Diagnosis not present

## 2016-10-16 DIAGNOSIS — K219 Gastro-esophageal reflux disease without esophagitis: Secondary | ICD-10-CM | POA: Diagnosis not present

## 2016-10-16 DIAGNOSIS — R400 Somnolence: Secondary | ICD-10-CM | POA: Diagnosis not present

## 2016-10-16 DIAGNOSIS — Z1211 Encounter for screening for malignant neoplasm of colon: Secondary | ICD-10-CM | POA: Diagnosis not present

## 2016-10-16 DIAGNOSIS — Z0000 Encounter for general adult medical examination without abnormal findings: Secondary | ICD-10-CM | POA: Diagnosis not present

## 2016-10-16 DIAGNOSIS — E785 Hyperlipidemia, unspecified: Secondary | ICD-10-CM | POA: Diagnosis not present

## 2016-10-16 DIAGNOSIS — Z780 Asymptomatic menopausal state: Secondary | ICD-10-CM | POA: Diagnosis not present

## 2016-10-16 DIAGNOSIS — I10 Essential (primary) hypertension: Secondary | ICD-10-CM | POA: Diagnosis not present

## 2016-10-16 DIAGNOSIS — Z1231 Encounter for screening mammogram for malignant neoplasm of breast: Secondary | ICD-10-CM | POA: Diagnosis not present

## 2016-10-16 DIAGNOSIS — F4322 Adjustment disorder with anxiety: Secondary | ICD-10-CM | POA: Diagnosis not present

## 2016-10-16 DIAGNOSIS — K5730 Diverticulosis of large intestine without perforation or abscess without bleeding: Secondary | ICD-10-CM | POA: Diagnosis not present

## 2016-10-16 DIAGNOSIS — R32 Unspecified urinary incontinence: Secondary | ICD-10-CM | POA: Diagnosis not present

## 2016-10-21 ENCOUNTER — Other Ambulatory Visit: Payer: Self-pay | Admitting: Internal Medicine

## 2016-10-21 DIAGNOSIS — G4733 Obstructive sleep apnea (adult) (pediatric): Secondary | ICD-10-CM | POA: Diagnosis not present

## 2016-10-21 DIAGNOSIS — Z1231 Encounter for screening mammogram for malignant neoplasm of breast: Secondary | ICD-10-CM

## 2016-10-27 ENCOUNTER — Ambulatory Visit (INDEPENDENT_AMBULATORY_CARE_PROVIDER_SITE_OTHER): Payer: PPO | Admitting: Internal Medicine

## 2016-10-27 ENCOUNTER — Encounter: Payer: Self-pay | Admitting: Internal Medicine

## 2016-10-27 VITALS — BP 112/68 | HR 62 | Ht 62.5 in | Wt 146.6 lb

## 2016-10-27 DIAGNOSIS — I422 Other hypertrophic cardiomyopathy: Secondary | ICD-10-CM

## 2016-10-27 DIAGNOSIS — R0789 Other chest pain: Secondary | ICD-10-CM | POA: Diagnosis not present

## 2016-10-27 DIAGNOSIS — R0609 Other forms of dyspnea: Secondary | ICD-10-CM | POA: Diagnosis not present

## 2016-10-27 DIAGNOSIS — I10 Essential (primary) hypertension: Secondary | ICD-10-CM

## 2016-10-27 DIAGNOSIS — R0600 Dyspnea, unspecified: Secondary | ICD-10-CM

## 2016-10-27 NOTE — Patient Instructions (Signed)
Medication Instructions:  Your physician recommends that you continue on your current medications as directed. Please refer to the Current Medication list given to you today.   Follow-Up: Your physician wants you to follow-up in: 1 YEAR with Dr. Debara Pickett.  You will receive a reminder letter in the mail two months in advance. If you don't receive a letter, please call our office to schedule the follow-up appointment.   Any Other Special Instructions Will Be Listed Below (If Applicable).     If you need a refill on your cardiac medications before your next appointment, please call your pharmacy.

## 2016-10-29 NOTE — Progress Notes (Signed)
OFFICE NOTE  Chief Complaint:  Follow-up stress echo  Primary Care Physician: Leeroy Cha, MD  HPI:  [name] is a 66 year old female who recently lost her insurance and has not followed up with her primary care provider, Dr. Emily Filbert in [place]. She recently presented to [facility] with left arm heaviness and numbness and chest discomfort. She was diagnosed with complex migraine. There was no specific headache during this episode she does not have a headache history. On September 9 she felt like she was having a heart attack. She had some heaviness in her chest and shortness of breath. EKG showed no significant changes. Apparently she had had a stress test at [facility] in 2015 which was negative for ischemia. She does have a history of sleep apnea in the past. She also has anxiety but is not currently taking medicine for that and hypertension. She reports being close to running out of her medications and does not have a primary care physician.  07/28/2016  [name] returns today for follow-up. She was recently in the hospital was noted to be bradycardic with atypical chest pain. Was thought to be reflux and she was started on PPI. She still feels tightness and heaviness in her chest. She underwent a nuclear stress test which was low risk with normal LV systolic function. She ruled out for MI by negative troponins. She did have a heart catheterization in 2004 and brought the images with her today. This showed essentially normal coronaries. An echocardiogram was performed due to a systolic murmur and she was found to have severe basal septal hypertrophy probably consistent with hypertrophic cardiomyopathy. Is not clear if she is more symptomatically with exertion or not. The outflow tract gradient was not measured and Valsalva was not performed. She does have some lability to her blood pressure is noted that she is symptomatic when her heart rate is in  the 50s feeling worse than she does when it's higher, although this is somewhat counterintuitive as the goal is to lower heart rate with hypertrophic cardiomyopathy. There is no mention of rest obstruction on her echo. She did report that her mom died with congestive heart failure may have had structural heart disease. She is also reporting some numbness and tingling in her feet although has good distal pulses and warm extremities.  10/29/2016  [name] returns for follow-up of her stress echocardiogram. I'm please reports was no dynamic gradient with exercise or dobutamine. This was negative for ischemia. Blood pressure appears much better controlled today 112/68. Overall she feels much better with the addition of losartan. Heart rate is now on the low 60s.  PMHx:  Past Medical History:  Diagnosis Date  . Bradycardia   . Chest pain   . Constipation   . Depression   . Diverticulitis   . Diverticulosis of colon   . DOE (dyspnea on exertion)   . DVT (deep venous thrombosis) ([place])   . Esophageal stricture   . Hyperlipemia   . Hypertension   . IBS (irritable bowel syndrome)   . Sleep apnea     Past Surgical History:  Procedure Laterality Date  . APPENDECTOMY    . BLOOD CLOT  2004   RIGHT LEG  . CHOLECYSTECTOMY    . COLON SURGERY    . GALLBLADDER SURGERY  2003  . NISSEN FUNDOPLICATION    . PLANTAR FASCIA SURGERY  2007  . sigmoid colectomy  10/21/2010   pelvic anastomosis  . VAGINAL  HYSTERECTOMY      FAMHx:  Family History  Problem Relation Age of Onset  . Heart disease Mother   . Crohn's disease Sister   . Colon polyps Sister   . Irritable bowel syndrome Brother   . Brain cancer Father   . Colitis Sister   . Colon cancer Neg Hx     SOCHx:   reports that she has never smoked. She has never used smokeless tobacco. She reports that she does not drink alcohol or use drugs.  ALLERGIES:  Allergies  Allergen Reactions  . Dicyclomine Other (See Comments)    REACTION:  Choking  . Lisinopril Other (See Comments)    REACTION: Choking  . Ace Inhibitors Other (See Comments)    Unknown to patient  . Penicillins Itching and Rash    ROS: Pertinent items noted in HPI and remainder of comprehensive ROS otherwise negative.  HOME MEDS: Current Outpatient Prescriptions  Medication Sig Dispense Refill  . aspirin 325 MG tablet Take 325 mg by mouth daily.    Marland Kitchen atorvastatin (LIPITOR) 20 MG tablet Take 20 mg by mouth daily.    . Cholecalciferol (VITAMIN D-3 PO) Take 1 capsule by mouth daily.    . hydrochlorothiazide (HYDRODIURIL) 25 MG tablet Take 1 tablet (25 mg total) by mouth daily. 30 tablet 3  . losartan (COZAAR) 25 MG tablet Take 1 tablet (25 mg total) by mouth daily. 90 tablet 3  . Melatonin 5 MG CAPS Take 1 capsule by mouth at bedtime.    . metoprolol (LOPRESSOR) 25 MG tablet Take 1 tablet (25 mg total) by mouth 2 (two) times daily. 180 tablet 3  . pantoprazole (PROTONIX) 40 MG tablet Take 1 tablet (40 mg total) by mouth daily before breakfast. 30 tablet 0  . Probiotic Product (ALIGN) 4 MG CAPS Take 4 mg by mouth daily.     No current facility-administered medications for this visit.     LABS/IMAGING: No results found for this or any previous visit (from the past 48 hour(s)). No results found.  WEIGHTS: Wt Readings from Last 3 Encounters:  10/27/16 146 lb 9.6 oz (66.5 kg)  07/28/16 151 lb (68.5 kg)  07/02/16 150 lb (68 kg)    VITALS: BP 112/68   Pulse 62   Ht 5' 2.5" (1.588 m)   Wt 146 lb 9.6 oz (66.5 kg)   BMI 26.39 kg/m   EXAM: Deferred  EKG: Deferred  ASSESSMENT: 1. Atypical chest pain - negative Myoview 06/2016-LVEF 77% - low risk to be the main stress echo (08/2016) 2. Echo suggestive of hypertrophic cardiomyopathy - LVEF 65-70%, severe focal basal hypertrophy (1.5 cm) and grade 2 diastolic dysfunction - no provoked gradient with dobutamine echo 3. Hypertension-controlled 4. Anxiety 5. Toe numbness- suspect peripheral  neuropathy  PLAN: 1.   [name] feels better with good control of her hypertension now after decreasing metoprolol starting her on losartan. Her stress echocardiogram was reassuring. We'll continue to follow that and plan to see her back annually or sooner as necessary.   Pixie Casino, MD, [facility] Attending Cardiologist [place] [facility] 10/29/2016, 3:04 PM

## 2016-11-02 ENCOUNTER — Ambulatory Visit: Payer: PPO

## 2016-11-12 DIAGNOSIS — G4733 Obstructive sleep apnea (adult) (pediatric): Secondary | ICD-10-CM | POA: Diagnosis not present

## 2016-11-16 ENCOUNTER — Ambulatory Visit
Admission: RE | Admit: 2016-11-16 | Discharge: 2016-11-16 | Disposition: A | Discharge status: Home / Self Care | Payer: PPO | Source: Ambulatory Visit | Attending: Internal Medicine | Admitting: Internal Medicine

## 2016-11-16 DIAGNOSIS — Z1231 Encounter for screening mammogram for malignant neoplasm of breast: Secondary | ICD-10-CM

## 2016-11-17 DIAGNOSIS — R6881 Early satiety: Secondary | ICD-10-CM | POA: Diagnosis not present

## 2016-11-17 DIAGNOSIS — R1030 Lower abdominal pain, unspecified: Secondary | ICD-10-CM | POA: Diagnosis not present

## 2016-11-17 DIAGNOSIS — R194 Change in bowel habit: Secondary | ICD-10-CM | POA: Diagnosis not present

## 2016-11-17 DIAGNOSIS — R140 Abdominal distension (gaseous): Secondary | ICD-10-CM | POA: Diagnosis not present

## 2016-11-17 DIAGNOSIS — K219 Gastro-esophageal reflux disease without esophagitis: Secondary | ICD-10-CM | POA: Diagnosis not present

## 2016-11-17 DIAGNOSIS — R1310 Dysphagia, unspecified: Secondary | ICD-10-CM | POA: Diagnosis not present

## 2016-11-22 DIAGNOSIS — R079 Chest pain, unspecified: Secondary | ICD-10-CM | POA: Diagnosis not present

## 2016-11-22 DIAGNOSIS — M545 Low back pain: Secondary | ICD-10-CM | POA: Diagnosis not present

## 2016-11-27 DIAGNOSIS — K900 Celiac disease: Secondary | ICD-10-CM | POA: Diagnosis not present

## 2016-11-27 DIAGNOSIS — Z9889 Other specified postprocedural states: Secondary | ICD-10-CM | POA: Diagnosis not present

## 2016-11-27 DIAGNOSIS — R1310 Dysphagia, unspecified: Secondary | ICD-10-CM | POA: Diagnosis not present

## 2016-11-27 DIAGNOSIS — K219 Gastro-esophageal reflux disease without esophagitis: Secondary | ICD-10-CM | POA: Diagnosis not present

## 2016-11-27 DIAGNOSIS — R1013 Epigastric pain: Secondary | ICD-10-CM | POA: Diagnosis not present

## 2016-11-27 DIAGNOSIS — K2930 Chronic superficial gastritis without bleeding: Secondary | ICD-10-CM | POA: Diagnosis not present

## 2016-12-01 DIAGNOSIS — K219 Gastro-esophageal reflux disease without esophagitis: Secondary | ICD-10-CM | POA: Diagnosis not present

## 2016-12-01 DIAGNOSIS — K900 Celiac disease: Secondary | ICD-10-CM | POA: Diagnosis not present

## 2016-12-01 DIAGNOSIS — K2930 Chronic superficial gastritis without bleeding: Secondary | ICD-10-CM | POA: Diagnosis not present

## 2016-12-02 DIAGNOSIS — Z780 Asymptomatic menopausal state: Secondary | ICD-10-CM | POA: Diagnosis not present

## 2016-12-15 DIAGNOSIS — G4733 Obstructive sleep apnea (adult) (pediatric): Secondary | ICD-10-CM | POA: Diagnosis not present

## 2016-12-16 DIAGNOSIS — Z1211 Encounter for screening for malignant neoplasm of colon: Secondary | ICD-10-CM | POA: Diagnosis not present

## 2016-12-16 DIAGNOSIS — E785 Hyperlipidemia, unspecified: Secondary | ICD-10-CM | POA: Diagnosis not present

## 2016-12-16 DIAGNOSIS — K900 Celiac disease: Secondary | ICD-10-CM | POA: Diagnosis not present

## 2016-12-16 DIAGNOSIS — K582 Mixed irritable bowel syndrome: Secondary | ICD-10-CM | POA: Diagnosis not present

## 2016-12-21 DIAGNOSIS — G4733 Obstructive sleep apnea (adult) (pediatric): Secondary | ICD-10-CM | POA: Diagnosis not present

## 2016-12-21 DIAGNOSIS — E785 Hyperlipidemia, unspecified: Secondary | ICD-10-CM | POA: Diagnosis not present

## 2016-12-21 DIAGNOSIS — K900 Celiac disease: Secondary | ICD-10-CM | POA: Diagnosis not present

## 2017-03-25 DIAGNOSIS — H9311 Tinnitus, right ear: Secondary | ICD-10-CM | POA: Diagnosis not present

## 2017-03-25 DIAGNOSIS — F5101 Primary insomnia: Secondary | ICD-10-CM | POA: Diagnosis not present

## 2017-03-25 DIAGNOSIS — E785 Hyperlipidemia, unspecified: Secondary | ICD-10-CM | POA: Diagnosis not present

## 2017-05-17 DIAGNOSIS — K219 Gastro-esophageal reflux disease without esophagitis: Secondary | ICD-10-CM | POA: Diagnosis not present

## 2017-05-17 DIAGNOSIS — K580 Irritable bowel syndrome with diarrhea: Secondary | ICD-10-CM | POA: Diagnosis not present

## 2017-05-17 DIAGNOSIS — K900 Celiac disease: Secondary | ICD-10-CM | POA: Diagnosis not present

## 2017-05-19 ENCOUNTER — Other Ambulatory Visit: Payer: Self-pay | Admitting: Gastroenterology

## 2017-05-19 DIAGNOSIS — Z1211 Encounter for screening for malignant neoplasm of colon: Secondary | ICD-10-CM

## 2017-05-27 ENCOUNTER — Ambulatory Visit
Admission: RE | Admit: 2017-05-27 | Discharge: 2017-05-27 | Disposition: A | Discharge status: Home / Self Care | Payer: PPO | Source: Ambulatory Visit | Attending: Gastroenterology | Admitting: Gastroenterology

## 2017-05-27 ENCOUNTER — Other Ambulatory Visit: Payer: Self-pay | Admitting: Gastroenterology

## 2017-05-27 DIAGNOSIS — K5730 Diverticulosis of large intestine without perforation or abscess without bleeding: Secondary | ICD-10-CM | POA: Diagnosis not present

## 2017-05-27 DIAGNOSIS — Z1211 Encounter for screening for malignant neoplasm of colon: Secondary | ICD-10-CM

## 2017-08-06 ENCOUNTER — Telehealth: Payer: Self-pay | Admitting: Internal Medicine

## 2017-08-06 ENCOUNTER — Ambulatory Visit
Admission: RE | Admit: 2017-08-06 | Discharge: 2017-08-06 | Disposition: A | Discharge status: Home / Self Care | Payer: PPO | Source: Ambulatory Visit | Attending: Internal Medicine | Admitting: Internal Medicine

## 2017-08-06 ENCOUNTER — Other Ambulatory Visit: Payer: Self-pay | Admitting: Internal Medicine

## 2017-08-06 DIAGNOSIS — M79671 Pain in right foot: Secondary | ICD-10-CM | POA: Diagnosis not present

## 2017-08-06 DIAGNOSIS — J302 Other seasonal allergic rhinitis: Secondary | ICD-10-CM | POA: Diagnosis not present

## 2017-08-06 DIAGNOSIS — I8391 Asymptomatic varicose veins of right lower extremity: Secondary | ICD-10-CM | POA: Diagnosis not present

## 2017-08-06 DIAGNOSIS — M19071 Primary osteoarthritis, right ankle and foot: Secondary | ICD-10-CM | POA: Diagnosis not present

## 2017-08-06 NOTE — Telephone Encounter (Signed)
Patient returned call. She reports more SOB when making her bed, bringing groceries in the house. She feels like her heart is pounding. She is experiencing these symptoms with minimal exertion which is new.   She came in to her house this afternoon and immediately checked her VS: BP 100/57 and HR 107 She rechecked BP prior to phone conversation: BP 126/68 and HR 101  Scheduled her to see Rufus PA on 08/12/17  Routed to MD as Juluis Rainier

## 2017-08-06 NOTE — Telephone Encounter (Signed)
LMTCB

## 2017-08-06 NOTE — Telephone Encounter (Signed)
New Message  Pt c/o Shortness Of Breath: STAT if SOB developed within the last 24 hours or pt is noticeably SOB on the phone  1. Are you currently SOB (can you hear that pt is SOB on the phone)? no  2. How long have you been experiencing SOB? 2-3 weeks  3. Are you SOB when sitting or when up moving around? Moving around  4. Are you currently experiencing any other symptoms? bp 100/57 pulse 107

## 2017-08-12 ENCOUNTER — Ambulatory Visit: Payer: PPO | Admitting: Physician Assistant

## 2017-09-13 DIAGNOSIS — R490 Dysphonia: Secondary | ICD-10-CM | POA: Diagnosis not present

## 2017-09-13 DIAGNOSIS — R05 Cough: Secondary | ICD-10-CM | POA: Diagnosis not present

## 2017-09-24 ENCOUNTER — Encounter: Payer: Self-pay | Admitting: Neurology

## 2017-09-24 DIAGNOSIS — G4733 Obstructive sleep apnea (adult) (pediatric): Secondary | ICD-10-CM | POA: Diagnosis not present

## 2017-09-24 DIAGNOSIS — G629 Polyneuropathy, unspecified: Secondary | ICD-10-CM | POA: Diagnosis not present

## 2017-09-24 DIAGNOSIS — F5101 Primary insomnia: Secondary | ICD-10-CM | POA: Diagnosis not present

## 2017-09-24 DIAGNOSIS — F4322 Adjustment disorder with anxiety: Secondary | ICD-10-CM | POA: Diagnosis not present

## 2017-10-20 DIAGNOSIS — Z6829 Body mass index (BMI) 29.0-29.9, adult: Secondary | ICD-10-CM | POA: Diagnosis not present

## 2017-10-20 DIAGNOSIS — Z1211 Encounter for screening for malignant neoplasm of colon: Secondary | ICD-10-CM | POA: Diagnosis not present

## 2017-10-20 DIAGNOSIS — E785 Hyperlipidemia, unspecified: Secondary | ICD-10-CM | POA: Diagnosis not present

## 2017-10-20 DIAGNOSIS — Z0000 Encounter for general adult medical examination without abnormal findings: Secondary | ICD-10-CM | POA: Diagnosis not present

## 2017-10-20 DIAGNOSIS — Z1389 Encounter for screening for other disorder: Secondary | ICD-10-CM | POA: Diagnosis not present

## 2017-10-20 DIAGNOSIS — K5730 Diverticulosis of large intestine without perforation or abscess without bleeding: Secondary | ICD-10-CM | POA: Diagnosis not present

## 2017-10-20 DIAGNOSIS — Z1239 Encounter for other screening for malignant neoplasm of breast: Secondary | ICD-10-CM | POA: Diagnosis not present

## 2017-10-20 DIAGNOSIS — F4322 Adjustment disorder with anxiety: Secondary | ICD-10-CM | POA: Diagnosis not present

## 2017-10-20 DIAGNOSIS — I10 Essential (primary) hypertension: Secondary | ICD-10-CM | POA: Diagnosis not present

## 2017-10-20 DIAGNOSIS — J302 Other seasonal allergic rhinitis: Secondary | ICD-10-CM | POA: Diagnosis not present

## 2017-10-25 NOTE — Progress Notes (Signed)
Cardiology Office Note:    Date:  10/27/2017   ID:  MAELEE Barrera, DOB 06-15-1950, MRN [id number]  PCP:  Leeroy Cha, MD  Cardiologist:  Pixie Casino, MD   Referring MD: Leeroy Cha,*   Chief Complaint  Patient presents with  . Chest Pain    states with activity   . Shortness of Breath    History of Present Illness:    [name] is a 67 y.o. female with a hx of HTN, HOCM, negative myoview 06/2016 for atypical chest pain, and a negative stress echocardiogram 08/2016 - no provoked gradient with dobutamine. She had a negative heart cath in 2004. She had a lapse in care due to losing her health insurance. She now  follows with Dr. Debara Pickett who has been working to control her HR and blood pressure. At last clinic visit  10/27/2016, she was doing quite well with HR in the 60s and blood pressure well-controlled. She reported feeling much better at that visit.    She presents today for her annual follow up.  She has had a change in her breathing. She is experiencing DOE for the past 6-7 months. She denies orthopnea, but reports swelling in her feet and ankles over the past 6 months. She reports right foot numbness and tingling, which is being followed by her PCP. She is worried because her sister just had a TAVR last July and her brother had a CABG in November 2018. She reports chest heaviness with exertion over the past 6 months. She has not sought medical care for these symptoms. She reports chest heaviness that occurs every day with exertion, lasts about 20 min and is resolved with rest. She rates the pressure as a 4/10. She states that this chest pain is different than her prior chest pain for which she underwent myoview. Her PCP's office pharmacist has increased her losartan to 50 mg daily for elevated pressure in the 180s (10/25/17). Her pressure here today is still elevated with systolic at 585.   Past Medical History:  Diagnosis Date  . Bradycardia   . Chest pain     . Constipation   . Depression   . Diverticulitis   . Diverticulosis of colon   . DOE (dyspnea on exertion)   . DVT (deep venous thrombosis) ([place])   . Esophageal stricture   . Hyperlipemia   . Hypertension   . IBS (irritable bowel syndrome)   . Sleep apnea     Past Surgical History:  Procedure Laterality Date  . APPENDECTOMY    . BLOOD CLOT  2004   RIGHT LEG  . CHOLECYSTECTOMY    . COLON SURGERY    . GALLBLADDER SURGERY  2003  . NISSEN FUNDOPLICATION    . PLANTAR FASCIA SURGERY  2007  . sigmoid colectomy  10/21/2010   pelvic anastomosis  . VAGINAL HYSTERECTOMY      Current Medications: Current Meds  Medication Sig  . aspirin 81 MG tablet Take 1 tablet (81 mg total) by mouth daily.  Marland Kitchen atorvastatin (LIPITOR) 20 MG tablet Take 20 mg by mouth daily.  . Cholecalciferol (VITAMIN D-3 PO) Take 1 capsule by mouth daily.  . citalopram (CELEXA) 10 MG tablet Take 1 tablet by mouth at bedtime.  . fluticasone (FLONASE) 50 MCG/ACT nasal spray Place 2 sprays into both nostrils daily at 10 pm.  . hydrochlorothiazide (HYDRODIURIL) 25 MG tablet Take 1 tablet (25 mg total) by mouth daily.  Marland Kitchen losartan (COZAAR) 25 MG tablet Take  3 tablets (75 mg total) by mouth daily.  . metoprolol (LOPRESSOR) 25 MG tablet Take 1 tablet (25 mg total) by mouth 2 (two) times daily.  . pantoprazole (PROTONIX) 40 MG tablet Take 1 tablet (40 mg total) by mouth daily before breakfast.  . ranitidine (ZANTAC) 300 MG capsule Take 300 mg by mouth at bedtime.  . [DISCONTINUED] aspirin 325 MG tablet Take 325 mg by mouth daily.  . [DISCONTINUED] losartan (COZAAR) 25 MG tablet Take 1 tablet (25 mg total) by mouth daily.     Allergies:   Dicyclomine; Lisinopril; Ace inhibitors; and Penicillins   Social History   Socioeconomic History  . Marital status: Legally Separated    Spouse name: Not on file  . Number of children: 1  . Years of education: GED  . Highest education level: Not on file  Occupational History  .  Occupation: Academic librarian: [employer]  Social Needs  . Financial resource strain: Not on file  . Food insecurity:    Worry: Not on file    Inability: Not on file  . Transportation needs:    Medical: Not on file    Non-medical: Not on file  Tobacco Use  . Smoking status: Never Smoker  . Smokeless tobacco: Never Used  Substance and Sexual Activity  . Alcohol use: No  . Drug use: No  . Sexual activity: Not on file  Lifestyle  . Physical activity:    Days per week: Not on file    Minutes per session: Not on file  . Stress: Not on file  Relationships  . Social connections:    Talks on phone: Not on file    Gets together: Not on file    Attends religious service: Not on file    Active member of club or organization: Not on file    Attends meetings of clubs or organizations: Not on file    Relationship status: Not on file  Other Topics Concern  . Not on file  Social History Narrative   Epworth Sleepiness Score = 9 (03/08/2015)     Family History: The patient's family history includes Brain cancer in her father; Breast cancer in her cousin; Colitis in her sister; Colon polyps in her sister; Crohn's disease in her sister; Heart disease in her mother; Irritable bowel syndrome in her brother. There is no history of Colon cancer.  ROS:   Please see the history of present illness.    All other systems reviewed and are negative.  EKGs/Labs/Other Studies Reviewed:    The following studies were reviewed today:  Myoview 07/04/16: 1. No reversible ischemia or infarction.  2. Normal left ventricular wall motion.  3. Left ventricular ejection fraction 77%  4. Non invasive risk stratification*: Low   Stress echo dobutamine 08/12/16: Study Conclusions  - Stress ECG conclusions: There were no stress arrhythmias or   conduction abnormalities. The stress ECG was negative for   ischemia. - Staged echo: There was no echocardiographic evidence for   stress-induced  ischemia.  Impressions:  - Good exercise capacity.   No ischemia on ECG. Rare PVCs during exertion.   Hypertensive response to exertion (207/79 mmHg).   Baseline LVOT gradient 3 mmHg, increased to 9 mmHg at peak   exercise.     Conclusion: No significant LVOT gradient on peak exertion.   EKG:  EKG is ordered today.  The ekg ordered today demonstrates sinus bradycardia with HR 50 bpm.  Recent Labs: No results found for  requested labs within last 8760 hours.  Recent Lipid Panel    Component Value Date/Time   CHOL 224 (H) 07/28/2016 0930   CHOL 209 (H) 11/22/2013 0232   TRIG 162 (H) 07/28/2016 0930   TRIG 99 11/22/2013 0232   HDL 57 07/28/2016 0930   HDL 56 11/22/2013 0232   CHOLHDL 3.9 07/28/2016 0930   VLDL 32 (H) 07/28/2016 0930   VLDL 20 11/22/2013 0232   LDLCALC 135 (H) 07/28/2016 0930   LDLCALC 133 (H) 11/22/2013 0232    Physical Exam:    VS:  BP (!) 150/70   Pulse (!) 50   Ht 5\' 1"  (1.549 m)   Wt 156 lb (70.8 kg)   BMI 29.48 kg/m     Wt Readings from Last 3 Encounters:  10/27/17 156 lb (70.8 kg)  10/27/16 146 lb 9.6 oz (66.5 kg)  07/28/16 151 lb (68.5 kg)     GEN: Well nourished, well developed in no acute distress HEENT: Normal NECK: No JVD; No carotid bruits LYMPHATICS: No lymphadenopathy CARDIAC: RRR, no murmurs, rubs, gallops RESPIRATORY:  Clear to auscultation without rales, wheezing or rhonchi  ABDOMEN: Soft, non-tender, non-distended MUSCULOSKELETAL:  trace edema; No deformity  SKIN: Warm and dry NEUROLOGIC:  Alert and oriented x 3 PSYCHIATRIC:  Normal affect   ASSESSMENT:    1. Dyspnea on exertion   2. Precordial chest pain   3. HYPERTENSION   4. Hypertrophic cardiomyopathy (HCC)    PLAN:    In order of problems listed above:  Dyspnea on exertion She is mildly hypervolemic on exam. Given her HOCM, I am hesitant to adjust her diuretic. Will obtain CBC, BMP, BNP and repeat and echocardiogram. She denies orthopnea, but at baseline  elevates her bed for hiatal hernia.   Precordial chest pain EKG today is nonischemic. HR is 50 and she is asymptomatic with this HR. She states this chest pain in exertion occurs daily and is different than her prior chest pain. I suspect an element of anxiety given her sister and brother's recent cardiac interventions. Will obtain CT coronary to rule out obstructive disease.   HYPERTENSION Pressures have been elevated. PCP pharmacist recently increased her losartan to 50 mg . She remaines elevated today. Will increase losartan to 75 mg and check a BMP in 1 week. I will continue her HCTZ as is.   Hypertrophic cardiomyopathy ([place]) Will obtain repeat echocardiogram given her DOE and lower extremity swelling. Continue lopressor, no changes.    Follow up in 2-3 weeks after echo and CT coronary.   Medication Adjustments/Labs and Tests Ordered: Current medicines are reviewed at length with the patient today.  Concerns regarding medicines are outlined above.  Orders Placed This Encounter  Procedures  . CT CORONARY MORPH W/CTA COR W/SCORE W/CA W/CM &/OR WO/CM  . CT CORONARY FRACTIONAL FLOW RESERVE DATA PREP  . CT CORONARY FRACTIONAL FLOW RESERVE FLUID ANALYSIS  . Pro b natriuretic peptide (BNP)  . Basic Metabolic Panel (BMET)  . CBC  . Basic Metabolic Panel (BMET)  . EKG 12-Lead  . ECHOCARDIOGRAM COMPLETE   Meds ordered this encounter  Medications  . aspirin 81 MG tablet    Sig: Take 1 tablet (81 mg total) by mouth daily.  Marland Kitchen losartan (COZAAR) 25 MG tablet    Sig: Take 3 tablets (75 mg total) by mouth daily.    Dispense:  90 tablet    Refill:  3    Signed, Ledora Bottcher, [place]  10/27/2017 10:08 AM  [facility]

## 2017-10-27 ENCOUNTER — Ambulatory Visit (INDEPENDENT_AMBULATORY_CARE_PROVIDER_SITE_OTHER): Payer: PPO | Admitting: Physician Assistant

## 2017-10-27 ENCOUNTER — Encounter: Payer: Self-pay | Admitting: Physician Assistant

## 2017-10-27 VITALS — BP 150/70 | HR 50 | Ht 61.0 in | Wt 156.0 lb

## 2017-10-27 DIAGNOSIS — R072 Precordial pain: Secondary | ICD-10-CM | POA: Diagnosis not present

## 2017-10-27 DIAGNOSIS — R0600 Dyspnea, unspecified: Secondary | ICD-10-CM

## 2017-10-27 DIAGNOSIS — R0609 Other forms of dyspnea: Secondary | ICD-10-CM

## 2017-10-27 DIAGNOSIS — I422 Other hypertrophic cardiomyopathy: Secondary | ICD-10-CM

## 2017-10-27 DIAGNOSIS — I10 Essential (primary) hypertension: Secondary | ICD-10-CM

## 2017-10-27 LAB — BASIC METABOLIC PANEL
BUN/Creatinine Ratio: 11 — ABNORMAL LOW (ref 12–28)
BUN: 9 mg/dL (ref 8–27)
CO2: 25 mmol/L (ref 20–29)
Calcium: 9.7 mg/dL (ref 8.7–10.3)
Chloride: 95 mmol/L — ABNORMAL LOW (ref 96–106)
Creatinine, Ser: 0.8 mg/dL (ref 0.57–1.00)
GFR calc Af Amer: 89 mL/min/{1.73_m2} (ref >59)
GFR calc non Af Amer: 77 mL/min/{1.73_m2} (ref >59)
Glucose: 86 mg/dL (ref 65–99)
Potassium: 4.3 mmol/L (ref 3.5–5.2)
Sodium: 135 mmol/L (ref 134–144)

## 2017-10-27 LAB — CBC
Hematocrit: 35.4 % (ref 34.0–46.6)
Hemoglobin: 12.2 g/dL (ref 11.1–15.9)
MCH: 30.6 pg (ref 26.6–33.0)
MCHC: 34.5 g/dL (ref 31.5–35.7)
MCV: 89 fL (ref 79–97)
Platelets: 312 10*3/uL (ref 150–450)
RBC: 3.99 x10E6/uL (ref 3.77–5.28)
RDW: 13.8 % (ref 12.3–15.4)
WBC: 8.4 10*3/uL (ref 3.4–10.8)

## 2017-10-27 LAB — PRO B NATRIURETIC PEPTIDE: NT-Pro BNP: 167 pg/mL (ref 0–301)

## 2017-10-27 MED ORDER — LOSARTAN POTASSIUM 25 MG PO TABS: 75.0000 mg | ORAL_TABLET | Freq: Every day | ORAL | 3 refills | Status: DC

## 2017-10-27 MED ORDER — ASPIRIN 81 MG PO TABS: 81.0000 mg | ORAL_TABLET | Freq: Every day | ORAL | Status: DC

## 2017-10-27 NOTE — Patient Instructions (Addendum)
Medication Instructions:  INCREASE Losartan 75mg  (3 tablets of the 25mg ) take 75mg  daily  Labwork: Your physician recommends that you return for lab work in: [facility], BMET, BNP Your physician recommends that you return for lab work in: 1 week BMET  Testing/Procedures: Your physician has requested that you have an echocardiogram. Echocardiography is a painless test that uses sound waves to create images of your heart. It provides your doctor with information about the size and shape of your heart and how well your heart's chambers and valves are working. This procedure takes approximately one hour. There are no restrictions for this procedure.  Coronary CTA-see instuctions  Follow-Up: Your physician recommends that you schedule a follow-up appointment in: 2-3 WEEKS with Almyra Deforest, PA-C  Any Other Special Instructions Will Be Listed Below (If Applicable).  If you need a refill on your cardiac medications before your next appointment, please call your pharmacy.    INSTRUCTIONS FOR CORONARY CTA  Please arrive at the [facility] main entrance of [facility] at (30-45 minutes prior to test start time)  [facility] [place], [place] [address] [phone]  Proceed to the [facility] Radiology Department (First Floor).  Please follow these instructions carefully (unless otherwise directed): PLEASE HAVE LABS - BMP  AT LEAST ONE WEEK PRIOR TO TEST  On the Night Before the Test: Drink plenty of water. Do not consume any caffeinated/decaffeinated beverages or chocolate 12 hours prior to your test. Do not take any antihistamines 12 hours prior to your test.  On the Day of the Test: Drink plenty of water. Do not drink any water within one hour of the test. Do not eat any food 4 hours prior to the test. You may take your regular medications prior to the test.   After the Test: Drink plenty of water. After receiving IV contrast, you may experience a  mild flushed feeling. This is normal. On occasion, you may experience a mild rash up to 24 hours after the test. This is not dangerous. If this occurs, you can take Benadryl 25 mg and increase your fluid intake. If you experience trouble breathing, this can be serious. If it is severe call 911 IMMEDIATELY. If it is mild, please call our office.

## 2017-11-02 ENCOUNTER — Other Ambulatory Visit: Payer: Self-pay

## 2017-11-02 ENCOUNTER — Ambulatory Visit (HOSPITAL_COMMUNITY): Payer: PPO | Attending: Cardiovascular Disease

## 2017-11-02 DIAGNOSIS — R0609 Other forms of dyspnea: Secondary | ICD-10-CM | POA: Diagnosis not present

## 2017-11-02 DIAGNOSIS — Z86718 Personal history of other venous thrombosis and embolism: Secondary | ICD-10-CM | POA: Diagnosis not present

## 2017-11-02 DIAGNOSIS — R001 Bradycardia, unspecified: Secondary | ICD-10-CM | POA: Diagnosis not present

## 2017-11-02 DIAGNOSIS — I10 Essential (primary) hypertension: Secondary | ICD-10-CM

## 2017-11-02 DIAGNOSIS — G4733 Obstructive sleep apnea (adult) (pediatric): Secondary | ICD-10-CM | POA: Diagnosis not present

## 2017-11-02 DIAGNOSIS — R609 Edema, unspecified: Secondary | ICD-10-CM | POA: Insufficient documentation

## 2017-11-02 DIAGNOSIS — Z8249 Family history of ischemic heart disease and other diseases of the circulatory system: Secondary | ICD-10-CM | POA: Insufficient documentation

## 2017-11-02 DIAGNOSIS — I422 Other hypertrophic cardiomyopathy: Secondary | ICD-10-CM

## 2017-11-02 DIAGNOSIS — R0600 Dyspnea, unspecified: Secondary | ICD-10-CM

## 2017-11-02 DIAGNOSIS — I119 Hypertensive heart disease without heart failure: Secondary | ICD-10-CM | POA: Insufficient documentation

## 2017-11-02 DIAGNOSIS — R072 Precordial pain: Secondary | ICD-10-CM

## 2017-11-02 LAB — BASIC METABOLIC PANEL
BUN/Creatinine Ratio: 11 — ABNORMAL LOW (ref 12–28)
BUN: 9 mg/dL (ref 8–27)
CO2: 27 mmol/L (ref 20–29)
Calcium: 9.8 mg/dL (ref 8.7–10.3)
Chloride: 94 mmol/L — ABNORMAL LOW (ref 96–106)
Creatinine, Ser: 0.85 mg/dL (ref 0.57–1.00)
GFR calc Af Amer: 83 mL/min/{1.73_m2} (ref >59)
GFR calc non Af Amer: 72 mL/min/{1.73_m2} (ref >59)
Glucose: 87 mg/dL (ref 65–99)
Potassium: 4.4 mmol/L (ref 3.5–5.2)
Sodium: 134 mmol/L (ref 134–144)

## 2017-11-05 DIAGNOSIS — I10 Essential (primary) hypertension: Secondary | ICD-10-CM | POA: Diagnosis not present

## 2017-11-05 DIAGNOSIS — E785 Hyperlipidemia, unspecified: Secondary | ICD-10-CM | POA: Diagnosis not present

## 2017-11-15 ENCOUNTER — Other Ambulatory Visit: Payer: Self-pay

## 2017-11-15 ENCOUNTER — Inpatient Hospital Stay
Admission: EM | Admit: 2017-11-15 | Discharge: 2017-11-16 | DRG: 155 | Disposition: A | Discharge status: Home / Self Care | Payer: PPO | Attending: Internal Medicine | Admitting: Internal Medicine

## 2017-11-15 ENCOUNTER — Emergency Department: Payer: PPO

## 2017-11-15 DIAGNOSIS — I10 Essential (primary) hypertension: Secondary | ICD-10-CM | POA: Diagnosis not present

## 2017-11-15 DIAGNOSIS — R061 Stridor: Secondary | ICD-10-CM | POA: Diagnosis not present

## 2017-11-15 DIAGNOSIS — Z888 Allergy status to other drugs, medicaments and biological substances status: Secondary | ICD-10-CM

## 2017-11-15 DIAGNOSIS — G4730 Sleep apnea, unspecified: Secondary | ICD-10-CM | POA: Diagnosis present

## 2017-11-15 DIAGNOSIS — R0603 Acute respiratory distress: Secondary | ICD-10-CM | POA: Diagnosis not present

## 2017-11-15 DIAGNOSIS — E785 Hyperlipidemia, unspecified: Secondary | ICD-10-CM | POA: Diagnosis present

## 2017-11-15 DIAGNOSIS — Z86718 Personal history of other venous thrombosis and embolism: Secondary | ICD-10-CM

## 2017-11-15 DIAGNOSIS — K219 Gastro-esophageal reflux disease without esophagitis: Secondary | ICD-10-CM | POA: Diagnosis present

## 2017-11-15 DIAGNOSIS — Z7951 Long term (current) use of inhaled steroids: Secondary | ICD-10-CM | POA: Diagnosis not present

## 2017-11-15 DIAGNOSIS — Z79899 Other long term (current) drug therapy: Secondary | ICD-10-CM

## 2017-11-15 DIAGNOSIS — I422 Other hypertrophic cardiomyopathy: Secondary | ICD-10-CM | POA: Diagnosis not present

## 2017-11-15 DIAGNOSIS — Z8249 Family history of ischemic heart disease and other diseases of the circulatory system: Secondary | ICD-10-CM

## 2017-11-15 DIAGNOSIS — R0602 Shortness of breath: Secondary | ICD-10-CM

## 2017-11-15 DIAGNOSIS — J385 Laryngeal spasm: Secondary | ICD-10-CM

## 2017-11-15 DIAGNOSIS — F329 Major depressive disorder, single episode, unspecified: Secondary | ICD-10-CM | POA: Diagnosis not present

## 2017-11-15 DIAGNOSIS — F419 Anxiety disorder, unspecified: Secondary | ICD-10-CM | POA: Diagnosis not present

## 2017-11-15 DIAGNOSIS — K589 Irritable bowel syndrome without diarrhea: Secondary | ICD-10-CM | POA: Diagnosis not present

## 2017-11-15 DIAGNOSIS — Z7982 Long term (current) use of aspirin: Secondary | ICD-10-CM | POA: Diagnosis not present

## 2017-11-15 DIAGNOSIS — Z880 Allergy status to penicillin: Secondary | ICD-10-CM | POA: Diagnosis not present

## 2017-11-15 DIAGNOSIS — R05 Cough: Secondary | ICD-10-CM | POA: Diagnosis not present

## 2017-11-15 DIAGNOSIS — J9600 Acute respiratory failure, unspecified whether with hypoxia or hypercapnia: Secondary | ICD-10-CM | POA: Diagnosis present

## 2017-11-15 DIAGNOSIS — R0902 Hypoxemia: Secondary | ICD-10-CM | POA: Diagnosis not present

## 2017-11-15 LAB — CBC
HCT: 37.4 % (ref 35.0–47.0)
Hemoglobin: 12.8 g/dL (ref 12.0–16.0)
MCH: 30.5 pg (ref 26.0–34.0)
MCHC: 34.4 g/dL (ref 32.0–36.0)
MCV: 88.7 fL (ref 80.0–100.0)
Platelets: 274 10*3/uL (ref 150–440)
RBC: 4.21 MIL/uL (ref 3.80–5.20)
RDW: 14.4 % (ref 11.5–14.5)
WBC: 14.7 10*3/uL — ABNORMAL HIGH (ref 3.6–11.0)

## 2017-11-15 LAB — BASIC METABOLIC PANEL
Anion gap: 11 (ref 5–15)
BUN: 18 mg/dL (ref 8–23)
CO2: 24 mmol/L (ref 22–32)
Calcium: 9.3 mg/dL (ref 8.9–10.3)
Chloride: 99 mmol/L (ref 98–111)
Creatinine, Ser: 0.92 mg/dL (ref 0.44–1.00)
GFR calc Af Amer: >60 mL/min (ref >60)
GFR calc non Af Amer: >60 mL/min (ref >60)
Glucose, Bld: 115 mg/dL — ABNORMAL HIGH (ref 70–99)
Potassium: 3.2 mmol/L — ABNORMAL LOW (ref 3.5–5.1)
Sodium: 134 mmol/L — ABNORMAL LOW (ref 135–145)

## 2017-11-15 LAB — TROPONIN I: Troponin I: <0.03 ng/mL (ref <0.03)

## 2017-11-15 MED ORDER — IPRATROPIUM-ALBUTEROL 0.5-2.5 (3) MG/3ML IN SOLN
3.0000 mL | Freq: Once | RESPIRATORY_TRACT | Status: AC
  Administered 2017-11-15: 3 mL via RESPIRATORY_TRACT
  Filled 2017-11-15: qty 3

## 2017-11-15 MED ORDER — IPRATROPIUM-ALBUTEROL 0.5-2.5 (3) MG/3ML IN SOLN
RESPIRATORY_TRACT | Status: AC
  Filled 2017-11-15: qty 3

## 2017-11-15 MED ORDER — METHYLPREDNISOLONE SODIUM SUCC 125 MG IJ SOLR
125.0000 mg | Freq: Once | INTRAMUSCULAR | Status: AC
  Administered 2017-11-15: 125 mg via INTRAVENOUS
  Filled 2017-11-15: qty 2

## 2017-11-15 MED ORDER — RACEPINEPHRINE HCL 2.25 % IN NEBU
0.5000 mL | INHALATION_SOLUTION | Freq: Once | RESPIRATORY_TRACT | Status: AC
  Administered 2017-11-16: 0.5 mL via RESPIRATORY_TRACT
  Filled 2017-11-15: qty 0.5

## 2017-11-15 MED ORDER — DIPHENHYDRAMINE HCL 50 MG/ML IJ SOLN
25.0000 mg | Freq: Once | INTRAMUSCULAR | Status: AC
  Administered 2017-11-15: 25 mg via INTRAVENOUS
  Filled 2017-11-15: qty 1

## 2017-11-15 NOTE — ED Triage Notes (Signed)
Pt comes via [facility] from home with c/o [facility] and weakness that started today along with coughing. Pt states white phlegm. Per EMS VSS and pt was given zofran. Pt is alert.

## 2017-11-15 NOTE — ED Notes (Signed)
Report given to Vanessa RN.

## 2017-11-15 NOTE — ED Notes (Signed)
Patient transported to X-ray 

## 2017-11-15 NOTE — ED Provider Notes (Signed)
[facility] Emergency Department Provider Note   ____________________________________________   First MD Initiated Contact with Patient 11/15/17 2325     (approximate)  I have reviewed the triage vital signs and the nursing notes.   HISTORY  Chief Complaint Shortness of Breath and Weakness    HPI [name] is a 67 y.o. female who comes into the hospital today with some shortness of breath and a choking sensation.  She has a history of esophageal problems and esophageal stretching.  She developed some shortness of breath and coughing spells today.  She states that she felt as though she could not breathe.  The last time she had her esophagus stretched was years ago but she has had some recent difficulty swallowing.  Her endoscopy last summer was negative.  The patient states that when she starts coughing she can stop and then she feels as though she cannot breathe.  The patient has some pain in her throat as well as pain in her ribs.  She has these coughing spells and she starts to turn blue around the mouth.  She is here today for evaluation.   Past Medical History:  Diagnosis Date  . Bradycardia   . Chest pain   . Constipation   . Depression   . Diverticulitis   . Diverticulosis of colon   . DOE (dyspnea on exertion)   . DVT (deep venous thrombosis) ([place])   . Esophageal stricture   . Hyperlipemia   . Hypertension   . IBS (irritable bowel syndrome)   . Sleep apnea     Patient Active Problem List   Diagnosis Date Noted  . Hypertrophic cardiomyopathy ([place]) 07/28/2016  . Screening for lipid disorders 07/28/2016  . DOE (dyspnea on exertion) 07/02/2016  . Bradycardia 07/02/2016  . Anxiety 07/02/2016  . Anxiety state   . Atypical chest pain 03/08/2015  . Fever 08/12/2010  . ABDOMINAL PAIN, LEFT LOWER QUADRANT 06/23/2010  . DIVERTICULITIS, ACUTE 02/25/2010  . CONSTIPATION 01/22/2010  . IRRITABLE BOWEL SYNDROME 01/22/2010  . Essential  hypertension 01/21/2010  . DEPRESSION 01/21/2010  . HYPERTENSION, CONTROLLED 01/21/2010  . DEEP VENOUS THROMBOPHLEBITIS 01/21/2010  . ESOPHAGEAL STRICTURE 02/29/2004  . Diverticulosis of colon 02/29/2004    Past Surgical History:  Procedure Laterality Date  . APPENDECTOMY    . BLOOD CLOT  2004   RIGHT LEG  . CHOLECYSTECTOMY    . COLON SURGERY    . GALLBLADDER SURGERY  2003  . NISSEN FUNDOPLICATION    . PLANTAR FASCIA SURGERY  2007  . sigmoid colectomy  10/21/2010   pelvic anastomosis  . VAGINAL HYSTERECTOMY      Prior to Admission medications   Medication Sig Start Date End Date Taking? Authorizing Provider  aspirin 81 MG tablet Take 1 tablet (81 mg total) by mouth daily. 10/27/17   Duke, Tami Lin, PA  atorvastatin (LIPITOR) 20 MG tablet Take 20 mg by mouth daily.    [provider]  Cholecalciferol (VITAMIN D-3 PO) Take 1 capsule by mouth daily.    [provider]  citalopram (CELEXA) 10 MG tablet Take 1 tablet by mouth at bedtime. 10/20/17   [provider]  fluticasone (FLONASE) 50 MCG/ACT nasal spray Place 2 sprays into both nostrils daily at 10 pm. 09/13/17   [provider]  hydrochlorothiazide (HYDRODIURIL) 25 MG tablet Take 1 tablet (25 mg total) by mouth daily. 03/08/15   Pixie Casino, MD  losartan (COZAAR) 25 MG tablet Take 3 tablets (  75 mg total) by mouth daily. 10/27/17   Duke, Tami Lin, PA  metoprolol (LOPRESSOR) 25 MG tablet Take 1 tablet (25 mg total) by mouth 2 (two) times daily. 07/28/16   Hilty, Nadean Corwin, MD  pantoprazole (PROTONIX) 40 MG tablet Take 1 tablet (40 mg total) by mouth daily before breakfast. 07/04/16   Lavina Hamman, MD  ranitidine (ZANTAC) 300 MG capsule Take 300 mg by mouth at bedtime. 10/21/17   [provider]    Allergies Dicyclomine; Lisinopril; Ace inhibitors; and Penicillins  Family History  Problem Relation Age of Onset  . Heart disease Mother   . Crohn's disease Sister   . Colon  polyps Sister   . Irritable bowel syndrome Brother   . Brain cancer Father   . Colitis Sister   . Breast cancer Cousin   . Colon cancer Neg Hx     Social History Social History   Tobacco Use  . Smoking status: Never Smoker  . Smokeless tobacco: Never Used  Substance Use Topics  . Alcohol use: No  . Drug use: No    Review of Systems  Constitutional: No fever/chills Eyes: No visual changes. ENT: Choking sensation Cardiovascular: Denies chest pain. Respiratory: Cough and shortness of breath. Gastrointestinal: No abdominal pain.  No nausea, no vomiting.  No diarrhea.  No constipation. Genitourinary: Negative for dysuria. Musculoskeletal: Negative for back pain. Skin: Negative for rash. Neurological: Negative for headaches, focal weakness or numbness.   ____________________________________________   PHYSICAL EXAM:  VITAL SIGNS: ED Triage Vitals  Enc Vitals Group     BP 11/15/17 2144 (!) 142/59     Pulse Rate 11/15/17 2144 74     Resp 11/15/17 2144 18     Temp 11/15/17 2144 98.8 F (37.1 C)     Temp Source 11/15/17 2144 Oral     SpO2 11/15/17 2144 100 %     Weight 11/15/17 2145 154 lb (69.9 kg)     Height 11/15/17 2145 5\' 1"  (1.549 m)     Head Circumference --      Peak Flow --      Pain Score 11/15/17 2145 4     Pain Loc --      Pain Edu? --      Excl. in [place]? --     Constitutional: Alert and oriented. Well appearing and in severe distress. Eyes: Conjunctivae are normal. PERRL. EOMI. Head: Atraumatic. Nose: No congestion/rhinnorhea. Mouth/Throat: Mucous membranes are moist.  Oropharynx non-erythematous.  Petechiae noted to patient's posterior oropharynx Neck:  Stridor with cough Cardiovascular: Normal rate, regular rhythm. Grossly normal heart sounds.  Good peripheral circulation. Respiratory: Increased respiratory effort.  No retractions. Lungs CTAB. Gastrointestinal: Soft and nontender. No distention.  Positive bowel sounds Musculoskeletal: No lower  extremity tenderness nor edema.   Neurologic:  Normal speech and language.  Skin:  Skin is warm, dry and intact.  Psychiatric: Mood and affect are normal.   ____________________________________________   LABS (all labs ordered are listed, but only abnormal results are displayed)  Labs Reviewed  BASIC METABOLIC PANEL - Abnormal; Notable for the following components:      Result Value   Sodium 134 (*)    Potassium 3.2 (*)    Glucose, Bld 115 (*)    All other components within normal limits  CBC - Abnormal; Notable for the following components:   WBC 14.7 (*)    All other components within normal limits  TROPONIN I  URINALYSIS, COMPLETE (UACMP) WITH MICROSCOPIC  CBG MONITORING, ED   ____________________________________________  EKG  ED ECG REPORT I, Loney Hering, the attending physician, personally viewed and interpreted this ECG.   Date: 11/15/2017  EKG Time: 2155  Rate: 88  Rhythm: normal sinus rhythm  Axis: normal  Intervals:none  ST&T Change: none  ____________________________________________  RADIOLOGY  ED MD interpretation: Chest x-ray: No acute findings, aortic atherosclerosis.  Official radiology report(s): Dg Chest 2 View  Result Date: 11/15/2017 CLINICAL DATA:  Shortness of breath and weakness.  Cough. EXAM: CHEST - 2 VIEW COMPARISON:  Radiographs 07/02/2016 FINDINGS: The cardiomediastinal contours are normal. Atherosclerosis of the aortic arch. Pulmonary vasculature is normal. No consolidation, pleural effusion, or pneumothorax. No acute osseous abnormalities are seen. IMPRESSION: 1. No acute findings. 2.  Aortic Atherosclerosis (ICD10-I70.0). Electronically Signed   By: Jeb Levering M.D.   On: 11/15/2017 22:10    ____________________________________________   PROCEDURES  Procedure(s) performed: None  Procedures  Critical Care performed: No  ____________________________________________   INITIAL IMPRESSION / ASSESSMENT AND PLAN / ED  COURSE  As part of my medical decision making, I reviewed the following data within the electronic MEDICAL RECORD NUMBER Notes from prior ED visits and [place] Controlled Substance Database   This is a 67 year old female who comes into the hospital today with some shortness of breath and choking sensation.  When I walked into the room to see the patient she was coughing and gasping with some stridulous breath sounds.  The patient states that she just cannot breathe.  I did initially order the patient some Benadryl, Solu-Medrol and a DuoNeb treatment.  While she was receiving her medications she did have a coughing episode and had a desaturation into the 50s.  The patient did not syncopized but she did become tachycardic.  I did give the patient some O2 by nonrebreather and then she was started on her breathing treatment.  I then gave the patient a racemic epinephrine treatment and some Ativan 0.5 mg IV x1.  I contacted the ear nose and throat physician.  He states to monitor the patient for 15 minutes and if she seems to improve then she does not need a bronchoscopy this evening.  The patient did seem to improve after the medications but given her desaturation and the episodic nature of her symptoms I felt it appropriate to admit the patient to the hospitalist service.  She states that her breathing feels improved and her coughing is also improved.  She will be admitted.      ____________________________________________   FINAL CLINICAL IMPRESSION(S) / ED DIAGNOSES  Final diagnoses:  Shortness of breath  Stridor  Hypoxia  Laryngospasm     ED Discharge Orders    None       Note:  This document was prepared using Dragon voice recognition software and may include unintentional dictation errors.   Loney Hering, MD 11/16/17 [phone]

## 2017-11-16 ENCOUNTER — Encounter: Payer: Self-pay | Admitting: Radiology

## 2017-11-16 ENCOUNTER — Inpatient Hospital Stay: Payer: PPO

## 2017-11-16 DIAGNOSIS — Z8249 Family history of ischemic heart disease and other diseases of the circulatory system: Secondary | ICD-10-CM | POA: Diagnosis not present

## 2017-11-16 DIAGNOSIS — J9600 Acute respiratory failure, unspecified whether with hypoxia or hypercapnia: Secondary | ICD-10-CM | POA: Diagnosis present

## 2017-11-16 DIAGNOSIS — I422 Other hypertrophic cardiomyopathy: Secondary | ICD-10-CM | POA: Diagnosis not present

## 2017-11-16 DIAGNOSIS — Z888 Allergy status to other drugs, medicaments and biological substances status: Secondary | ICD-10-CM | POA: Diagnosis not present

## 2017-11-16 DIAGNOSIS — G4730 Sleep apnea, unspecified: Secondary | ICD-10-CM | POA: Diagnosis not present

## 2017-11-16 DIAGNOSIS — R0603 Acute respiratory distress: Secondary | ICD-10-CM

## 2017-11-16 DIAGNOSIS — K589 Irritable bowel syndrome without diarrhea: Secondary | ICD-10-CM | POA: Diagnosis not present

## 2017-11-16 DIAGNOSIS — I959 Hypotension, unspecified: Secondary | ICD-10-CM | POA: Diagnosis not present

## 2017-11-16 DIAGNOSIS — R0602 Shortness of breath: Secondary | ICD-10-CM | POA: Diagnosis present

## 2017-11-16 DIAGNOSIS — Z880 Allergy status to penicillin: Secondary | ICD-10-CM | POA: Diagnosis not present

## 2017-11-16 DIAGNOSIS — F419 Anxiety disorder, unspecified: Secondary | ICD-10-CM | POA: Diagnosis not present

## 2017-11-16 DIAGNOSIS — E785 Hyperlipidemia, unspecified: Secondary | ICD-10-CM | POA: Diagnosis not present

## 2017-11-16 DIAGNOSIS — K219 Gastro-esophageal reflux disease without esophagitis: Secondary | ICD-10-CM | POA: Diagnosis not present

## 2017-11-16 DIAGNOSIS — Z7982 Long term (current) use of aspirin: Secondary | ICD-10-CM | POA: Diagnosis not present

## 2017-11-16 DIAGNOSIS — R061 Stridor: Secondary | ICD-10-CM | POA: Diagnosis not present

## 2017-11-16 DIAGNOSIS — F329 Major depressive disorder, single episode, unspecified: Secondary | ICD-10-CM | POA: Diagnosis not present

## 2017-11-16 DIAGNOSIS — Z79899 Other long term (current) drug therapy: Secondary | ICD-10-CM | POA: Diagnosis not present

## 2017-11-16 DIAGNOSIS — R0902 Hypoxemia: Secondary | ICD-10-CM | POA: Diagnosis not present

## 2017-11-16 DIAGNOSIS — J385 Laryngeal spasm: Secondary | ICD-10-CM | POA: Diagnosis not present

## 2017-11-16 DIAGNOSIS — Z7951 Long term (current) use of inhaled steroids: Secondary | ICD-10-CM | POA: Diagnosis not present

## 2017-11-16 DIAGNOSIS — I10 Essential (primary) hypertension: Secondary | ICD-10-CM | POA: Diagnosis not present

## 2017-11-16 DIAGNOSIS — Z86718 Personal history of other venous thrombosis and embolism: Secondary | ICD-10-CM | POA: Diagnosis not present

## 2017-11-16 HISTORY — DX: Acute respiratory failure, unspecified whether with hypoxia or hypercapnia: J96.00

## 2017-11-16 HISTORY — DX: Acute respiratory distress: R06.03

## 2017-11-16 LAB — CBC
HCT: 36.9 % (ref 35.0–47.0)
Hemoglobin: 13 g/dL (ref 12.0–16.0)
MCH: 31.3 pg (ref 26.0–34.0)
MCHC: 35.2 g/dL (ref 32.0–36.0)
MCV: 89 fL (ref 80.0–100.0)
Platelets: 266 10*3/uL (ref 150–440)
RBC: 4.15 MIL/uL (ref 3.80–5.20)
RDW: 14.4 % (ref 11.5–14.5)
WBC: 14.3 10*3/uL — ABNORMAL HIGH (ref 3.6–11.0)

## 2017-11-16 LAB — BASIC METABOLIC PANEL
Anion gap: 11 (ref 5–15)
BUN: 19 mg/dL (ref 8–23)
CO2: 23 mmol/L (ref 22–32)
Calcium: 9.2 mg/dL (ref 8.9–10.3)
Chloride: 99 mmol/L (ref 98–111)
Creatinine, Ser: 0.91 mg/dL (ref 0.44–1.00)
GFR calc Af Amer: >60 mL/min (ref >60)
GFR calc non Af Amer: >60 mL/min (ref >60)
Glucose, Bld: 164 mg/dL — ABNORMAL HIGH (ref 70–99)
Potassium: 3.2 mmol/L — ABNORMAL LOW (ref 3.5–5.1)
Sodium: 133 mmol/L — ABNORMAL LOW (ref 135–145)

## 2017-11-16 LAB — GLUCOSE, CAPILLARY: Glucose-Capillary: 150 mg/dL — ABNORMAL HIGH (ref 70–99)

## 2017-11-16 MED ORDER — ACETAMINOPHEN 650 MG RE SUPP: 650.0000 mg | Freq: Four times a day (QID) | RECTAL | Status: DC | PRN

## 2017-11-16 MED ORDER — ONDANSETRON HCL 4 MG PO TABS: 4.0000 mg | ORAL_TABLET | Freq: Four times a day (QID) | ORAL | Status: DC | PRN

## 2017-11-16 MED ORDER — LORAZEPAM 2 MG/ML IJ SOLN
0.5000 mg | Freq: Once | INTRAMUSCULAR | Status: AC
  Administered 2017-11-16: 0.5 mg via INTRAVENOUS
  Filled 2017-11-16: qty 1

## 2017-11-16 MED ORDER — ONDANSETRON HCL 4 MG/2ML IJ SOLN: 4.0000 mg | Freq: Four times a day (QID) | INTRAMUSCULAR | Status: DC | PRN

## 2017-11-16 MED ORDER — FLUTICASONE PROPIONATE 50 MCG/ACT NA SUSP
2.0000 | Freq: Every day | NASAL | Status: DC
  Filled 2017-11-16: qty 16

## 2017-11-16 MED ORDER — FLUTICASONE PROPIONATE HFA 220 MCG/ACT IN AERO: 2.0000 | INHALATION_SPRAY | Freq: Two times a day (BID) | RESPIRATORY_TRACT | 0 refills | Status: DC

## 2017-11-16 MED ORDER — ATORVASTATIN CALCIUM 20 MG PO TABS
20.0000 mg | ORAL_TABLET | Freq: Every day | ORAL | Status: DC
  Administered 2017-11-16: 20 mg via ORAL
  Filled 2017-11-16: qty 1

## 2017-11-16 MED ORDER — DIAZEPAM 2 MG PO TABS: 2.0000 mg | ORAL_TABLET | Freq: Three times a day (TID) | ORAL | 0 refills | Status: DC | PRN

## 2017-11-16 MED ORDER — POTASSIUM CHLORIDE CRYS ER 20 MEQ PO TBCR
40.0000 meq | EXTENDED_RELEASE_TABLET | Freq: Once | ORAL | Status: AC
  Administered 2017-11-16: 40 meq via ORAL
  Filled 2017-11-16: qty 2

## 2017-11-16 MED ORDER — HEPARIN SODIUM (PORCINE) 5000 UNIT/ML IJ SOLN
5000.0000 [IU] | Freq: Three times a day (TID) | INTRAMUSCULAR | Status: DC
  Administered 2017-11-16: 5000 [IU] via SUBCUTANEOUS
  Filled 2017-11-16: qty 1

## 2017-11-16 MED ORDER — ACETAMINOPHEN 325 MG PO TABS: 650.0000 mg | ORAL_TABLET | Freq: Four times a day (QID) | ORAL | Status: DC | PRN

## 2017-11-16 MED ORDER — METOPROLOL TARTRATE 25 MG PO TABS
25.0000 mg | ORAL_TABLET | Freq: Two times a day (BID) | ORAL | Status: DC
  Administered 2017-11-16: 25 mg via ORAL
  Filled 2017-11-16: qty 1

## 2017-11-16 MED ORDER — ASPIRIN EC 81 MG PO TBEC
81.0000 mg | DELAYED_RELEASE_TABLET | Freq: Every day | ORAL | Status: DC
  Administered 2017-11-16: 81 mg via ORAL
  Filled 2017-11-16: qty 1

## 2017-11-16 MED ORDER — HYDROCHLOROTHIAZIDE 25 MG PO TABS
25.0000 mg | ORAL_TABLET | Freq: Every day | ORAL | Status: DC
  Administered 2017-11-16: 25 mg via ORAL
  Filled 2017-11-16: qty 1

## 2017-11-16 MED ORDER — IOHEXOL 300 MG/ML  SOLN
75.0000 mL | Freq: Once | INTRAMUSCULAR | Status: AC | PRN
  Administered 2017-11-16: 75 mL via INTRAVENOUS

## 2017-11-16 MED ORDER — PANTOPRAZOLE SODIUM 40 MG PO TBEC
40.0000 mg | DELAYED_RELEASE_TABLET | Freq: Every day | ORAL | Status: DC
  Administered 2017-11-16: 40 mg via ORAL
  Filled 2017-11-16: qty 1

## 2017-11-16 MED ORDER — CITALOPRAM HYDROBROMIDE 20 MG PO TABS: 10.0000 mg | ORAL_TABLET | Freq: Every day | ORAL | Status: DC

## 2017-11-16 MED ORDER — HYDROCODONE-ACETAMINOPHEN 5-325 MG PO TABS: 1.0000 | ORAL_TABLET | ORAL | Status: DC | PRN

## 2017-11-16 MED ORDER — VITAMIN D3 25 MCG (1000 UNIT) PO TABS
5000.0000 [IU] | ORAL_TABLET | Freq: Every day | ORAL | Status: DC
  Administered 2017-11-16: 5000 [IU] via ORAL
  Filled 2017-11-16: qty 5

## 2017-11-16 MED ORDER — FAMOTIDINE 20 MG PO TABS
20.0000 mg | ORAL_TABLET | Freq: Every day | ORAL | Status: DC
  Administered 2017-11-16: 20 mg via ORAL
  Filled 2017-11-16: qty 1

## 2017-11-16 MED ORDER — IPRATROPIUM-ALBUTEROL 0.5-2.5 (3) MG/3ML IN SOLN
3.0000 mL | Freq: Four times a day (QID) | RESPIRATORY_TRACT | Status: DC
  Administered 2017-11-16 (×2): 3 mL via RESPIRATORY_TRACT
  Filled 2017-11-16 (×2): qty 3

## 2017-11-16 MED ORDER — METHYLPREDNISOLONE SODIUM SUCC 125 MG IJ SOLR: 80.0000 mg | Freq: Two times a day (BID) | INTRAMUSCULAR | Status: DC

## 2017-11-16 MED ORDER — PNEUMOCOCCAL VAC POLYVALENT 25 MCG/0.5ML IJ INJ: 0.5000 mL | INJECTION | INTRAMUSCULAR | Status: DC

## 2017-11-16 MED ORDER — ALBUTEROL SULFATE HFA 108 (90 BASE) MCG/ACT IN AERS: 2.0000 | INHALATION_SPRAY | Freq: Four times a day (QID) | RESPIRATORY_TRACT | 0 refills | Status: DC | PRN

## 2017-11-16 MED ORDER — BISACODYL 5 MG PO TBEC: 5.0000 mg | DELAYED_RELEASE_TABLET | Freq: Every day | ORAL | Status: DC | PRN

## 2017-11-16 MED ORDER — TRAZODONE HCL 50 MG PO TABS: 25.0000 mg | ORAL_TABLET | Freq: Every evening | ORAL | Status: DC | PRN

## 2017-11-16 MED ORDER — DOCUSATE SODIUM 100 MG PO CAPS: 100.0000 mg | ORAL_CAPSULE | Freq: Two times a day (BID) | ORAL | Status: DC

## 2017-11-16 NOTE — Consult Note (Signed)
[name], [name] [id number] Jan 15, 1951 [name], MD  Reason for Consult: Laryngospasm and stridor  HPI: 67 year old female chart reviewed.  Patient has a long history of laryngospasm is associated with cough.  She says that when she is exposed to heat chemicals pollens she will begin coughing eventually ends up in laryngospasm.  Seen approximately 4 weeks ago by an ENT surgeon in [place] who did a flexible laryngoscopy in the office and diagnosed her with the same laryngospasm issue.  She has had these "choking spells" for years.  Yesterday she began coughing event that led into airway obstruction.  She presented to the emergency room admitted for observation.  Since then the coughing has resolved her voice is back to normal is having no difficulty difficulty breathing at this point.  Allergies:  Allergies  Allergen Reactions  . Dicyclomine Other (See Comments)    REACTION: Choking  . Lisinopril Other (See Comments)    REACTION: Choking  . Losartan Shortness Of Breath  . Ace Inhibitors Other (See Comments)    Unknown to patient  . Penicillins Itching and Rash    ROS: Review of systems normal other than 12 systems except per HPI.  PMH:  Past Medical History:  Diagnosis Date  . Bradycardia   . Chest pain   . Constipation   . Depression   . Diverticulitis   . Diverticulosis of colon   . DOE (dyspnea on exertion)   . DVT (deep venous thrombosis) ([place])   . Esophageal stricture   . Hyperlipemia   . Hypertension   . IBS (irritable bowel syndrome)   . Sleep apnea     FH:  Family History  Problem Relation Age of Onset  . Heart disease Mother   . Crohn's disease Sister   . Colon polyps Sister   . Irritable bowel syndrome Brother   . Brain cancer Father   . Colitis Sister   . Breast cancer Cousin   . Colon cancer Neg Hx     SH:  Social History   Socioeconomic History  . Marital status: Legally Separated    Spouse name: Not on file  . Number of children: 1  .  Years of education: GED  . Highest education level: Not on file  Occupational History  . Occupation: Academic librarian: [employer]  Social Needs  . Financial resource strain: Not on file  . Food insecurity:    Worry: Not on file    Inability: Not on file  . Transportation needs:    Medical: Not on file    Non-medical: Not on file  Tobacco Use  . Smoking status: Never Smoker  . Smokeless tobacco: Never Used  Substance and Sexual Activity  . Alcohol use: No  . Drug use: No  . Sexual activity: Not on file  Lifestyle  . Physical activity:    Days per week: Not on file    Minutes per session: Not on file  . Stress: Not on file  Relationships  . Social connections:    Talks on phone: Not on file    Gets together: Not on file    Attends religious service: Not on file    Active member of club or organization: Not on file    Attends meetings of clubs or organizations: Not on file    Relationship status: Not on file  . Intimate partner violence:    Fear of current or ex partner: Not on file    Emotionally abused: Not  on file    Physically abused: Not on file    Forced sexual activity: Not on file  Other Topics Concern  . Not on file  Social History Narrative   Epworth Sleepiness Score = 9 (03/08/2015)    PSH:  Past Surgical History:  Procedure Laterality Date  . APPENDECTOMY    . BLOOD CLOT  2004   RIGHT LEG  . CHOLECYSTECTOMY    . COLON SURGERY    . GALLBLADDER SURGERY  2003  . NISSEN FUNDOPLICATION    . PLANTAR FASCIA SURGERY  2007  . sigmoid colectomy  10/21/2010   pelvic anastomosis  . VAGINAL HYSTERECTOMY      Physical  Exam: Sitting upright in bed no stridor normal voice CN 2-12 grossly intact and symmetric. EAC/TMs normal BL. Oral cavity, lips, gums, ororpharynx normal with no masses or lesions. Skin warm and dry. Nasal cavity without polyps or purulence. External nose and ears without masses or lesions. EOMI, PERRLA. Neck supple with no masses or lesions.  No lymphadenopathy palpated. Thyroid normal with no masses.   A/P: Classic laryngospasm I spent approximately 20 minutes discussing with her exactly what laryngospasm is we also discussed some exercises with Valsalva that she will try to break the laryngospasm when it happens.  Dr. Earleen Newport present during our discussion.  I would also recommend a trial of Flovent 2 puffs inhaled twice daily tried to decrease some of the airway irritation.  Would also recommend an albuterol inhaler as a rescue inhaler when the coughing begins.  Would also recommend a low-dose Valium 2 to 5 mg taken at the onset of cough hopefully prevent some laryngospasm in the future.  Also given her my card see me as an outpatient and we will do allergy testing on her to see her back in the office.  I did not do laryngoscopy on her today as she just had this about 4 weeks ago and she has normal voice and no stridor. If her symptoms worsen we can do laryngoscopy in the office.  She is also on ARB drug which we will try to get her off of that and of course not using an ACE inhibitor as well.   [name] 11/16/2017 11:20 AM

## 2017-11-16 NOTE — Progress Notes (Signed)
Discharge instructions explained to pt / verbalized an understanding/ iv and tele removed/ RX given to pt/ home meds returned to pt/ transported off unit via wheelchair.

## 2017-11-16 NOTE — Discharge Summary (Signed)
[place] [facility] [place] NAME: [name]    MR#:  [id number]  DATE OF BIRTH:  07-23-1950  DATE OF ADMISSION:  11/15/2017 ADMITTING PHYSICIAN: Amelia Jo, MD  DATE OF DISCHARGE: 11/16/2017 12:34 PM  PRIMARY CARE PHYSICIAN: Leeroy Cha, MD    ADMISSION DIAGNOSIS:  Shortness of breath [R06.02] Stridor [R06.1] Laryngospasm [J38.5] Hypoxia [R09.02]  DISCHARGE DIAGNOSIS:  Laryngospasm  SECONDARY DIAGNOSIS:   Past Medical History:  Diagnosis Date  . Bradycardia   . Chest pain   . Constipation   . Depression   . Diverticulitis   . Diverticulosis of colon   . DOE (dyspnea on exertion)   . DVT (deep venous thrombosis) ([place])   . Esophageal stricture   . Hyperlipemia   . Hypertension   . IBS (irritable bowel syndrome)   . Sleep apnea     HOSPITAL COURSE:   1.  Laryngospasm.  This has improved.  Case discussed with Dr. Tami Ribas.  He advised the patient lying down pinching her nose and pushed down to push some pressure there  with a Valsalva maneuver.  He also advised a steroid inhaler and albuterol inhaler and as needed Valium.  He will follow her up in the office and do allergy testing. 2.  Acute respiratory distress has resolved 3.  Essential hypertension stop Cozaar.  Continue other medications 4.  Anxiety depression on Celexa and I added PRN Valium 5.  GERD on Protonix 6.  Hyperlipidemia unspecified on atorvastatin.  DISCHARGE CONDITIONS:   Satisfactory.  CONSULTS OBTAINED:  Treatment Team:  Beverly Gust, MD  DRUG ALLERGIES:   Allergies  Allergen Reactions  . Dicyclomine Other (See Comments)    REACTION: Choking  . Lisinopril Other (See Comments)    REACTION: Choking  . Losartan Shortness Of Breath  . Ace Inhibitors Other (See Comments)    Unknown to patient  . Penicillins Itching and Rash    DISCHARGE MEDICATIONS:   Allergies as of 11/16/2017      Reactions   Dicyclomine Other (See Comments)   REACTION: Choking   Lisinopril Other (See Comments)   REACTION: Choking   Losartan Shortness Of Breath   Ace Inhibitors Other (See Comments)   Unknown to patient   Penicillins Itching, Rash      Medication List    TAKE these medications   albuterol 108 (90 Base) MCG/ACT inhaler Commonly known as:  PROVENTIL HFA;VENTOLIN HFA Inhale 2 puffs into the lungs every 6 (six) hours as needed for wheezing or shortness of breath.   aspirin 81 MG tablet Take 1 tablet (81 mg total) by mouth daily.   atorvastatin 20 MG tablet Commonly known as:  LIPITOR Take 20 mg by mouth daily.   citalopram 10 MG tablet Commonly known as:  CELEXA Take 1 tablet by mouth at bedtime.   diazepam 2 MG tablet Commonly known as:  VALIUM Take 1 tablet (2 mg total) by mouth every 8 (eight) hours as needed (laryngospasm).   fluticasone 220 MCG/ACT inhaler Commonly known as:  FLOVENT HFA Inhale 2 puffs into the lungs 2 (two) times daily. Can substitute any steroid inhaler best covered by insurance   fluticasone 50 MCG/ACT nasal spray Commonly known as:  FLONASE Place 2 sprays into both nostrils daily at 10 pm.   hydrochlorothiazide 25 MG tablet Commonly known as:  HYDRODIURIL Take 1 tablet (25 mg total) by mouth daily.   metoprolol tartrate 25 MG tablet Commonly known as:  LOPRESSOR Take 1  tablet (25 mg total) by mouth 2 (two) times daily.   pantoprazole 40 MG tablet Commonly known as:  PROTONIX Take 1 tablet (40 mg total) by mouth daily before breakfast.   ranitidine 300 MG capsule Commonly known as:  ZANTAC Take 300 mg by mouth at bedtime.   VITAMIN D-3 PO Take 1 capsule by mouth daily.        DISCHARGE INSTRUCTIONS:   Follow-up PMD 6 days Follow-up with Dr.  Tami Ribas ENT 1-2 weeks  If you experience worsening of your admission symptoms, develop shortness of breath, life threatening emergency, suicidal or homicidal thoughts you must seek medical attention immediately by calling 911 or  calling your MD immediately  if symptoms less severe.  You Must read complete instructions/literature along with all the possible adverse reactions/side effects for all the Medicines you take and that have been prescribed to you. Take any new Medicines after you have completely understood and accept all the possible adverse reactions/side effects.   Please note  You were cared for by a hospitalist during your hospital stay. If you have any questions about your discharge medications or the care you received while you were in the hospital after you are discharged, you can call the unit and asked to speak with the hospitalist on call if the hospitalist that took care of you is not available. Once you are discharged, your primary care physician will handle any further medical issues. Please note that NO REFILLS for any discharge medications will be authorized once you are discharged, as it is imperative that you return to your primary care physician (or establish a relationship with a primary care physician if you do not have one) for your aftercare needs so that they can reassess your need for medications and monitor your lab values.    Today   CHIEF COMPLAINT:   Chief Complaint  Patient presents with  . Shortness of Breath  . Weakness    HISTORY OF PRESENT ILLNESS:  [name]  is a 67 y.o. female with a known history of laryngospasm presented with shortness of breath   VITAL SIGNS:  Blood pressure (!) 109/57, pulse 82, temperature 97.9 F (36.6 C), temperature source Oral, resp. rate 20, height 5\' 1"  (1.549 m), weight 68.9 kg (151 lb 14.4 oz), SpO2 98 %.    PHYSICAL EXAMINATION:  GENERAL:  67 y.o.-year-old patient lying in the bed with no acute distress.  EYES: Pupils equal, round, reactive to light and accommodation. No scleral icterus. Extraocular muscles intact.  HEENT: Head atraumatic, normocephalic. Oropharynx and nasopharynx clear.  NECK:  Supple, no jugular venous  distention. No thyroid enlargement, no tenderness.  LUNGS: Normal breath sounds bilaterally, no wheezing, rales,rhonchi or crepitation. No use of accessory muscles of respiration.  CARDIOVASCULAR: S1, S2 normal. No murmurs, rubs, or gallops.  ABDOMEN: Soft, non-tender, non-distended. Bowel sounds present. No organomegaly or mass.  EXTREMITIES: No pedal edema, cyanosis, or clubbing.  NEUROLOGIC: Cranial nerves II through XII are intact. Muscle strength 5/5 in all extremities. Sensation intact. Gait not checked.  PSYCHIATRIC: The patient is alert and oriented x 3.  SKIN: No obvious rash, lesion, or ulcer.   DATA REVIEW:   CBC Recent Labs  Lab 11/16/17 0620  WBC 14.3*  HGB 13.0  HCT 36.9  PLT 266    Chemistries  Recent Labs  Lab 11/16/17 0620  NA 133*  K 3.2*  CL 99  CO2 23  GLUCOSE 164*  BUN 19  CREATININE 0.91  CALCIUM 9.2  Cardiac Enzymes Recent Labs  Lab 11/15/17 2147  TROPONINI <0.03     RADIOLOGY:  Dg Chest 2 View  Result Date: 11/15/2017 CLINICAL DATA:  Shortness of breath and weakness.  Cough. EXAM: CHEST - 2 VIEW COMPARISON:  Radiographs 07/02/2016 FINDINGS: The cardiomediastinal contours are normal. Atherosclerosis of the aortic arch. Pulmonary vasculature is normal. No consolidation, pleural effusion, or pneumothorax. No acute osseous abnormalities are seen. IMPRESSION: 1. No acute findings. 2.  Aortic Atherosclerosis (ICD10-I70.0). Electronically Signed   By: Jeb Levering M.D.   On: 11/15/2017 22:10   Ct Soft Tissue Neck W Contrast  Result Date: 11/16/2017 CLINICAL DATA:  Initial evaluation for acute sore throat, stridor. EXAM: CT NECK WITH CONTRAST TECHNIQUE: Multidetector CT imaging of the neck was performed using the standard protocol following the bolus administration of intravenous contrast. CONTRAST:  46mL OMNIPAQUE IOHEXOL 300 MG/ML  SOLN COMPARISON:  None. FINDINGS: Pharynx and larynx: Oral cavity within normal limits without mass lesion or  loculated fluid collection. Palatine tonsils symmetric and normal. No tonsillar or peritonsillar abscess. Parapharyngeal fat maintained. Nasopharynx normal. No retropharyngeal swelling or collection. Epiglottis normal. Vallecula clear. Remainder of the hypopharynx and supraglottic larynx within normal limits. True cords symmetric and normal. Subglottic airway clear. Salivary glands: Parotid and submandibular glands are within normal limits. Thyroid: Subcentimeter hypodense left thyroid nodule, felt to be of no clinical significance. Thyroid otherwise unremarkable. Lymph nodes: No adenopathy within the neck. Vascular: Atheromatous plaque about the aortic arch and right carotid bifurcation. Normal intravascular enhancement seen throughout the neck. Limited intracranial: Scattered vascular calcifications noted within the carotid siphons. Otherwise unremarkable. Visualized orbits: Unremarkable. Mastoids and visualized paranasal sinuses: Visualized paranasal sinuses are clear. Mastoid air cells and middle ear cavities are well pneumatized and free of fluid. Skeleton: No acute osseous abnormality. No worrisome lytic or blastic osseous lesions. Moderate cervical spondylolysis at C5-6 and C6-7. Left greater than right facet arthrosis. Patient is edentulous. Upper chest: Visualized upper chest demonstrates no acute finding. Partially visualized lungs are clear. Other: None. IMPRESSION: 1. No acute abnormality within the neck. No evidence for tonsillitis or epiglottitis. No significant inflammatory changes. 2. Moderate cervical spondylolysis, greatest at C5-6 and C6-7. Electronically Signed   By: Jeannine Boga M.D.   On: 11/16/2017 02:49       Management plans discussed with the patient, family and they are in agreement.  CODE STATUS:     Code Status Orders  (From admission, onward)        Start     Ordered   11/16/17 0336  Full code  Continuous     11/16/17 0335    Code Status History    Date  Active Date Inactive Code Status Order ID Comments User Context   07/02/2016 1700 07/04/2016 2031 Full Code [id number]  Radene Gunning, NP ED      TOTAL TIME TAKING CARE OF THIS PATIENT: 35 minutes.    [name] M.D on 11/16/2017 at 3:37 PM  Between 7am to 6pm - Pager - [phone]  After 6pm go to [email or web address] - password [email or web address]  [facility] Office  [phone]  CC: Primary care physician; Leeroy Cha, MD

## 2017-11-16 NOTE — Discharge Instructions (Signed)
Laryngospasm.  Please try and lie down and pinch nose and push down.  Can try valium with these episodes.

## 2017-11-16 NOTE — Plan of Care (Signed)
  Problem: Education: Goal: Knowledge of General Education information will improve Outcome: Progressing   Problem: Pain Managment: Goal: General experience of comfort will improve Outcome: Progressing   Problem: Safety: Goal: Ability to remain free from injury will improve Outcome: Progressing   

## 2017-11-16 NOTE — H&P (Addendum)
[place] at [place] NAME: [name]    MR#:  [id number]  DATE OF BIRTH:  [birth year]  DATE OF ADMISSION:  11/15/2017  PRIMARY CARE PHYSICIAN: Leeroy Cha, MD   REQUESTING/REFERRING PHYSICIAN:   CHIEF COMPLAINT:   Chief Complaint  Patient presents with  . Shortness of Breath  . Weakness    HISTORY OF PRESENT ILLNESS: [name]  is a 67 y.o. female with a known history of hypertension, hyperlipidemia, sleep apnea, esophageal stricture status post dilatation 2 years ago.  Patient presented to emergency room for acute onset of shortness of breath, severe coughing spells and choking sensation, started few hours before the arrival to emergency room.  When she has the coughing spells, she feels like she cannot breathe and she turns blue around the mouth.  These episodes were witnessed in the emergency room as well, patient was noted with stridor.  Her oxygen saturation during the coughing spells went down to 50s and her blood pressure was low at 90/50.  Her symptoms improved in the emergency room after she was treated with duo nebs, Solu-Medrol, Benadryl and racemic epinephrine.  No reports of fever or chills no chest pain, no nausea, vomiting, diarrhea, bleeding. Blood test done emergency room, including CBC and CMP are unremarkable except for WBC at 14.7. Chest x-ray and CT soft tissue of the neck are unremarkable. Patient is admitted for further evaluation and treatment.   PAST MEDICAL HISTORY:   Past Medical History:  Diagnosis Date  . Bradycardia   . Chest pain   . Constipation   . Depression   . Diverticulitis   . Diverticulosis of colon   . DOE (dyspnea on exertion)   . DVT (deep venous thrombosis) ([place])   . Esophageal stricture   . Hyperlipemia   . Hypertension   . IBS (irritable bowel syndrome)   . Sleep apnea     PAST SURGICAL HISTORY:  Past Surgical History:  Procedure Laterality Date  . APPENDECTOMY     . BLOOD CLOT  2004   RIGHT LEG  . CHOLECYSTECTOMY    . COLON SURGERY    . GALLBLADDER SURGERY  2003  . NISSEN FUNDOPLICATION    . PLANTAR FASCIA SURGERY  2007  . sigmoid colectomy  10/21/2010   pelvic anastomosis  . VAGINAL HYSTERECTOMY      SOCIAL HISTORY:  Social History   Tobacco Use  . Smoking status: Never Smoker  . Smokeless tobacco: Never Used  Substance Use Topics  . Alcohol use: No    FAMILY HISTORY:  Family History  Problem Relation Age of Onset  . Heart disease Mother   . Crohn's disease Sister   . Colon polyps Sister   . Irritable bowel syndrome Brother   . Brain cancer Father   . Colitis Sister   . Breast cancer Cousin   . Colon cancer Neg Hx     DRUG ALLERGIES:  Allergies  Allergen Reactions  . Dicyclomine Other (See Comments)    REACTION: Choking  . Lisinopril Other (See Comments)    REACTION: Choking  . Ace Inhibitors Other (See Comments)    Unknown to patient  . Penicillins Itching and Rash    REVIEW OF SYSTEMS:   CONSTITUTIONAL: No fever, fatigue or weakness.  EYES: No blurred or double vision.  EARS, NOSE, AND THROAT: No tinnitus or ear pain.  RESPIRATORY: Severe right coughing spells, shortness of breath, no wheezing or hemoptysis.  Positive for  stridor. CARDIOVASCULAR: No chest pain, orthopnea, edema.  GASTROINTESTINAL: No nausea, vomiting, diarrhea or abdominal pain.  GENITOURINARY: No dysuria, hematuria.  ENDOCRINE: No polyuria, nocturia,  HEMATOLOGY: No bleeding SKIN: No rash or lesion. MUSCULOSKELETAL: No joint pain or arthritis.   NEUROLOGIC: No focal weakness.  PSYCHIATRY: No anxiety or depression.   MEDICATIONS AT HOME:  Prior to Admission medications   Medication Sig Start Date End Date Taking? Authorizing Provider  aspirin 81 MG tablet Take 1 tablet (81 mg total) by mouth daily. 10/27/17  Yes Duke, Tami Lin, PA  atorvastatin (LIPITOR) 20 MG tablet Take 20 mg by mouth daily.   Yes [provider]   Cholecalciferol (VITAMIN D-3 PO) Take 1 capsule by mouth daily.   Yes [provider]  citalopram (CELEXA) 10 MG tablet Take 1 tablet by mouth at bedtime. 10/20/17  Yes [provider]  fluticasone (FLONASE) 50 MCG/ACT nasal spray Place 2 sprays into both nostrils daily at 10 pm. 09/13/17  Yes [provider]  hydrochlorothiazide (HYDRODIURIL) 25 MG tablet Take 1 tablet (25 mg total) by mouth daily. 03/08/15  Yes Hilty, Nadean Corwin, MD  losartan (COZAAR) 25 MG tablet Take 3 tablets (75 mg total) by mouth daily. 10/27/17  Yes Duke, Tami Lin, PA  metoprolol (LOPRESSOR) 25 MG tablet Take 1 tablet (25 mg total) by mouth 2 (two) times daily. 07/28/16  Yes Hilty, Nadean Corwin, MD  pantoprazole (PROTONIX) 40 MG tablet Take 1 tablet (40 mg total) by mouth daily before breakfast. 07/04/16  Yes Lavina Hamman, MD  ranitidine (ZANTAC) 300 MG capsule Take 300 mg by mouth at bedtime. 10/21/17  Yes [provider]      PHYSICAL EXAMINATION:   VITAL SIGNS: Blood pressure (!) 90/42, pulse 87, temperature 98.8 F (37.1 C), temperature source Oral, resp. rate (!) 22, height 5\' 1"  (1.549 m), weight 69.9 kg (154 lb), SpO2 96 %.  GENERAL:  67 y.o.-year-old patient lying in the bed with no acute distress, status post Benadryl, DuoNebs, Solu-Medrol and racemic epinephrine.  EYES: Pupils equal, round, reactive to light and accommodation. No scleral icterus. Extraocular muscles intact.  HEENT: Head atraumatic, normocephalic. Oropharynx and nasopharynx clear.  NECK:  Supple, no jugular venous distention. No thyroid enlargement, no tenderness.  LUNGS: Normal breath sounds bilaterally, no wheezing, rales,rhonchi or crepitation. No use of accessory muscles of respiration.  CARDIOVASCULAR: S1, S2 normal. No S3/S4..  ABDOMEN: Soft, nontender, nondistended. Bowel sounds present. No organomegaly or mass.  EXTREMITIES: No pedal edema, cyanosis, or clubbing.  NEUROLOGIC: Cranial nerves II  through XII are intact. Muscle strength 5/5 in all extremities. Sensation intact. PSYCHIATRIC: The patient is alert and oriented x 3.  SKIN: No obvious rash, lesion, or ulcer.   LABORATORY PANEL:   CBC Recent Labs  Lab 11/15/17 2147  WBC 14.7*  HGB 12.8  HCT 37.4  PLT 274  MCV 88.7  MCH 30.5  MCHC 34.4  RDW 14.4   ------------------------------------------------------------------------------------------------------------------  Chemistries  Recent Labs  Lab 11/15/17 2147  NA 134*  K 3.2*  CL 99  CO2 24  GLUCOSE 115*  BUN 18  CREATININE 0.92  CALCIUM 9.3   ------------------------------------------------------------------------------------------------------------------ estimated creatinine clearance is 53.7 mL/min (by C-G formula based on SCr of 0.92 mg/dL). ------------------------------------------------------------------------------------------------------------------ No results for input(s): TSH, T4TOTAL, T3FREE, THYROIDAB in the last 72 hours.  Invalid input(s): FREET3   Coagulation profile No results for input(s): INR, PROTIME in the last 168 hours. ------------------------------------------------------------------------------------------------------------------- No results for input(s): DDIMER in the last  72 hours. -------------------------------------------------------------------------------------------------------------------  Cardiac Enzymes Recent Labs  Lab 11/15/17 2147  TROPONINI <0.03   ------------------------------------------------------------------------------------------------------------------ Invalid input(s): POCBNP  ---------------------------------------------------------------------------------------------------------------  Urinalysis    Component Value Date/Time   COLORURINE Yellow 11/22/2013 0741   COLORURINE AMBER BIOCHEMICALS MAY BE AFFECTED BY COLOR (A) 08/13/2010 1052   APPEARANCEUR Clear 11/22/2013 0741   LABSPEC 1.010  11/22/2013 0741   PHURINE 7.0 11/22/2013 0741   PHURINE 6.0 08/13/2010 1052   GLUCOSEU Negative 11/22/2013 0741   GLUCOSEU NEGATIVE 08/12/2010 1641   HGBUR 1+ 11/22/2013 0741   HGBUR SMALL (A) 08/13/2010 1052   BILIRUBINUR Negative 11/22/2013 0741   KETONESUR Negative 11/22/2013 0741   KETONESUR TRACE (A) 08/13/2010 1052   PROTEINUR Negative 11/22/2013 0741   PROTEINUR 100 (A) 08/13/2010 1052   UROBILINOGEN 1.0 08/13/2010 1052   NITRITE Negative 11/22/2013 0741   NITRITE NEGATIVE 08/13/2010 1052   LEUKOCYTESUR Trace 11/22/2013 0741     RADIOLOGY: Dg Chest 2 View  Result Date: 11/15/2017 CLINICAL DATA:  Shortness of breath and weakness.  Cough. EXAM: CHEST - 2 VIEW COMPARISON:  Radiographs 07/02/2016 FINDINGS: The cardiomediastinal contours are normal. Atherosclerosis of the aortic arch. Pulmonary vasculature is normal. No consolidation, pleural effusion, or pneumothorax. No acute osseous abnormalities are seen. IMPRESSION: 1. No acute findings. 2.  Aortic Atherosclerosis (ICD10-I70.0). Electronically Signed   By: Jeb Levering M.D.   On: 11/15/2017 22:10    EKG: Orders placed or performed during the hospital encounter of 11/15/17  . ED EKG  . ED EKG  . EKG 12-Lead  . EKG 12-Lead  . EKG 12-Lead  . EKG 12-Lead    IMPRESSION AND PLAN:  1.  Acute respiratory distress with transient hypoxia 2.  Stridor 3.  Acute severe coughing spells 4.  Laryngospasm 5.  HTN 6.  HL  Patient symptoms could be related to angioedema due to ARB.  Per chart review, patient had similar symptoms with lisinopril use.  Will stop losartan and continue to monitor clinically closely.  We will continue steroids for another 24 hours.  Patient is to avoid ACEIs and ARB use in the future. Blood pressure is currently low, will hold BP meds for now.  We will continue statin for hyperlipidemia.    All the records are reviewed and case discussed with ED provider. Management plans discussed with the  patient, family and they are in agreement.  CODE STATUS: Full Code Status History    Date Active Date Inactive Code Status Order ID Comments User Context   07/02/2016 1700 07/04/2016 2031 Full Code [id number]  Radene Gunning, NP ED       TOTAL TIME TAKING CARE OF THIS PATIENT: 45 minutes.    Amelia Jo M.D on 11/16/2017 at 1:43 AM  Between 7am to 6pm - Pager - [phone]  After 6pm go to [email or web address] - password [email or web address]  Office  [phone]  CC: Primary care physician; Leeroy Cha, MD

## 2017-11-17 ENCOUNTER — Ambulatory Visit: Payer: PPO | Admitting: Physician Assistant

## 2017-11-17 LAB — HIV ANTIBODY (ROUTINE TESTING W REFLEX): HIV Screen 4th Generation wRfx: NONREACTIVE

## 2017-11-18 ENCOUNTER — Ambulatory Visit (HOSPITAL_COMMUNITY): Payer: PPO

## 2017-11-18 ENCOUNTER — Ambulatory Visit (HOSPITAL_COMMUNITY)
Admission: RE | Admit: 2017-11-18 | Discharge: 2017-11-18 | Disposition: A | Discharge status: Home / Self Care | Payer: PPO | Source: Ambulatory Visit | Attending: Physician Assistant | Admitting: Physician Assistant

## 2017-11-18 DIAGNOSIS — R072 Precordial pain: Secondary | ICD-10-CM | POA: Insufficient documentation

## 2017-11-18 DIAGNOSIS — R0609 Other forms of dyspnea: Secondary | ICD-10-CM | POA: Diagnosis not present

## 2017-11-18 DIAGNOSIS — R0602 Shortness of breath: Secondary | ICD-10-CM | POA: Diagnosis not present

## 2017-11-18 DIAGNOSIS — I700 Atherosclerosis of aorta: Secondary | ICD-10-CM | POA: Insufficient documentation

## 2017-11-18 MED ORDER — METOPROLOL TARTRATE 5 MG/5ML IV SOLN
5.0000 mg | INTRAVENOUS | Status: AC | PRN
  Administered 2017-11-18 (×2): 5 mg via INTRAVENOUS
  Filled 2017-11-18 (×3): qty 5

## 2017-11-18 MED ORDER — METOPROLOL TARTRATE 5 MG/5ML IV SOLN
INTRAVENOUS | Status: AC
  Filled 2017-11-18: qty 10

## 2017-11-18 MED ORDER — IOPAMIDOL (ISOVUE-370) INJECTION 76%
100.0000 mL | Freq: Once | INTRAVENOUS | Status: AC | PRN
  Administered 2017-11-18: 80 mL via INTRAVENOUS

## 2017-11-18 MED ORDER — IOPAMIDOL (ISOVUE-370) INJECTION 76%
INTRAVENOUS | Status: AC
  Filled 2017-11-18: qty 100

## 2017-11-18 MED ORDER — NITROGLYCERIN 0.4 MG SL SUBL
SUBLINGUAL_TABLET | SUBLINGUAL | Status: AC
  Filled 2017-11-18: qty 2

## 2017-11-18 MED ORDER — NITROGLYCERIN 0.4 MG SL SUBL
0.8000 mg | SUBLINGUAL_TABLET | Freq: Once | SUBLINGUAL | Status: AC
  Administered 2017-11-18: 0.8 mg via SUBLINGUAL
  Filled 2017-11-18: qty 25

## 2017-11-23 DIAGNOSIS — R0603 Acute respiratory distress: Secondary | ICD-10-CM | POA: Diagnosis not present

## 2017-11-23 DIAGNOSIS — F4322 Adjustment disorder with anxiety: Secondary | ICD-10-CM | POA: Diagnosis not present

## 2017-11-23 DIAGNOSIS — J209 Acute bronchitis, unspecified: Secondary | ICD-10-CM | POA: Diagnosis not present

## 2017-11-24 ENCOUNTER — Ambulatory Visit (INDEPENDENT_AMBULATORY_CARE_PROVIDER_SITE_OTHER): Payer: PPO | Admitting: Physician Assistant

## 2017-11-24 ENCOUNTER — Encounter: Payer: Self-pay | Admitting: Physician Assistant

## 2017-11-24 VITALS — BP 144/60 | HR 54 | Ht 61.0 in | Wt 151.0 lb

## 2017-11-24 DIAGNOSIS — I10 Essential (primary) hypertension: Secondary | ICD-10-CM | POA: Diagnosis not present

## 2017-11-24 DIAGNOSIS — I421 Obstructive hypertrophic cardiomyopathy: Secondary | ICD-10-CM | POA: Diagnosis not present

## 2017-11-24 DIAGNOSIS — E785 Hyperlipidemia, unspecified: Secondary | ICD-10-CM | POA: Diagnosis not present

## 2017-11-24 DIAGNOSIS — J385 Laryngeal spasm: Secondary | ICD-10-CM

## 2017-11-24 DIAGNOSIS — R0600 Dyspnea, unspecified: Secondary | ICD-10-CM

## 2017-11-24 MED ORDER — HYDRALAZINE HCL 10 MG PO TABS: 10.0000 mg | ORAL_TABLET | Freq: Three times a day (TID) | ORAL | 3 refills | Status: DC

## 2017-11-24 NOTE — Patient Instructions (Signed)
Medication Instructions: START Hydralazine 10 mg three times a day  If you need a refill on your cardiac medications before your next appointment, please call your pharmacy.    Follow-Up: Your physician wants you to follow-up in 6-9 months with Dr. Debara Pickett. You will receive a reminder letter in the mail two months in advance. If you don't receive a letter, please call our office at [phone] to schedule this follow-up appointment.   Thank you for choosing [facility] at [facility]!!

## 2017-11-24 NOTE — Progress Notes (Signed)
Cardiology Office Note    Date:  11/26/2017   ID:  CRYSTALMARIE Barrera, DOB 07-12-1950, MRN [id number]  PCP:  Leeroy Cha, MD  Cardiologist:  Dr. Debara Pickett  Chief Complaint  Patient presents with  . Follow-up from testing and hospital    pt states she has bronchitis---c/o chest tightness when going up steps and SOB, and elevated BP; occasional dizziness; mild swelling in ankles    History of Present Illness:  [name] is a 67 y.o. female with PMH of HTN, HLD, HOCM, OSA, h/o DVT, and h/o chest pain.  Patient had a negative cardiac catheterization in 2004.  She also had a negative Myoview in February 2018 for atypical chest pain.  Stress echo in April 2018 was also negative as well, no provoked gradient with dobutamine.  She was last seen by Fabian Sharp PA-C on 10/27/2017 at which time she was complaining of some dyspnea on exertion for the past 6 to 7 months.  She reported chest heaviness with exertion as well.  Due to her progressive symptoms, coronary CTA was recommended.  More recently, patient was admitted to the hospital for acute onset of shortness of breath, severe coughing spell and a choking sensation.  Oxygen saturation during the coughing spell went down to the 50s and the blood pressure went down to the 90/50.  She was seen and treated for laryngospasm and stridor by Dr. Tami Ribas of ENT.  Coronary CT obtained on 11/18/2017 showed a coronary calcium score of 0, normal coronary origin with the right dominance, no evidence of CAD.  Patient presents today for cardiology office visit.  laryngoShe continued to have occasional episodes of laryngospasm.  Recent cardiac work-up has been very reassuring.  She denies any chest pain.  She does have some baseline dyspnea, however there does not seems to be any cardiac component.  She has no lower extremity edema, orthopnea or PND.  Past Medical History:  Diagnosis Date  . Bradycardia   . Chest pain   . Constipation   . Depression   .  Diverticulitis   . Diverticulosis of colon   . DOE (dyspnea on exertion)   . DVT (deep venous thrombosis) ([place])   . Esophageal stricture   . Hyperlipemia   . Hypertension   . IBS (irritable bowel syndrome)   . Sleep apnea     Past Surgical History:  Procedure Laterality Date  . APPENDECTOMY    . BLOOD CLOT  2004   RIGHT LEG  . CHOLECYSTECTOMY    . COLON SURGERY    . GALLBLADDER SURGERY  2003  . NISSEN FUNDOPLICATION    . PLANTAR FASCIA SURGERY  2007  . sigmoid colectomy  10/21/2010   pelvic anastomosis  . VAGINAL HYSTERECTOMY      Current Medications: Outpatient Medications Prior to Visit  Medication Sig Dispense Refill  . albuterol (PROVENTIL HFA;VENTOLIN HFA) 108 (90 Base) MCG/ACT inhaler Inhale 2 puffs into the lungs every 6 (six) hours as needed for wheezing or shortness of breath. 1 Inhaler 0  . aspirin 81 MG tablet Take 1 tablet (81 mg total) by mouth daily.    Marland Kitchen atorvastatin (LIPITOR) 20 MG tablet Take 20 mg by mouth daily.    . Cholecalciferol (VITAMIN D-3 PO) Take 1 capsule by mouth daily.    . citalopram (CELEXA) 10 MG tablet Take 1 tablet by mouth at bedtime.  2  . fluticasone (FLOVENT HFA) 220 MCG/ACT inhaler Inhale 2 puffs into the lungs 2 (two) times  daily. Can substitute any steroid inhaler best covered by insurance 1 Inhaler 0  . hydrochlorothiazide (HYDRODIURIL) 25 MG tablet Take 1 tablet (25 mg total) by mouth daily. 30 tablet 3  . metoprolol (LOPRESSOR) 25 MG tablet Take 1 tablet (25 mg total) by mouth 2 (two) times daily. 180 tablet 3  . pantoprazole (PROTONIX) 40 MG tablet Take 1 tablet (40 mg total) by mouth daily before breakfast. 30 tablet 0  . ranitidine (ZANTAC) 300 MG capsule Take 300 mg by mouth at bedtime.  1  . diazepam (VALIUM) 2 MG tablet Take 1 tablet (2 mg total) by mouth every 8 (eight) hours as needed (laryngospasm). 20 tablet 0  . fluticasone (FLONASE) 50 MCG/ACT nasal spray Place 2 sprays into both nostrils daily at 10 pm.  5   No  facility-administered medications prior to visit.      Allergies:   Dicyclomine; Lisinopril; Losartan; Ace inhibitors; and Penicillins   Social History   Socioeconomic History  . Marital status: Legally Separated    Spouse name: Not on file  . Number of children: 1  . Years of education: GED  . Highest education level: Not on file  Occupational History  . Occupation: Academic librarian: [employer]  Social Needs  . Financial resource strain: Not on file  . Food insecurity:    Worry: Not on file    Inability: Not on file  . Transportation needs:    Medical: Not on file    Non-medical: Not on file  Tobacco Use  . Smoking status: Never Smoker  . Smokeless tobacco: Never Used  Substance and Sexual Activity  . Alcohol use: No  . Drug use: No  . Sexual activity: Not on file  Lifestyle  . Physical activity:    Days per week: Not on file    Minutes per session: Not on file  . Stress: Not on file  Relationships  . Social connections:    Talks on phone: Not on file    Gets together: Not on file    Attends religious service: Not on file    Active member of club or organization: Not on file    Attends meetings of clubs or organizations: Not on file    Relationship status: Not on file  Other Topics Concern  . Not on file  Social History Narrative   Epworth Sleepiness Score = 9 (03/08/2015)     Family History:  The patient's family history includes Brain cancer in her father; Breast cancer in her cousin; Colitis in her sister; Colon polyps in her sister; Crohn's disease in her sister; Heart disease in her mother; Irritable bowel syndrome in her brother.   ROS:   Please see the history of present illness.    ROS All other systems reviewed and are negative.   PHYSICAL EXAM:   VS:  BP (!) 144/60   Pulse (!) 54   Ht 5\' 1"  (1.549 m)   Wt 151 lb (68.5 kg)   BMI 28.53 kg/m    GEN: Well nourished, well developed, in no acute distress  HEENT: normal  Neck: no JVD, carotid  bruits, or masses Cardiac: RRR; no murmurs, rubs, or gallops,no edema  Respiratory:  clear to auscultation bilaterally, normal work of breathing GI: soft, nontender, nondistended, + BS MS: no deformity or atrophy  Skin: warm and dry, no rash Neuro:  Alert and Oriented x 3, Strength and sensation are intact Psych: euthymic mood, full affect  Wt Readings  from Last 3 Encounters:  11/24/17 151 lb (68.5 kg)  11/16/17 151 lb 14.4 oz (68.9 kg)  10/27/17 156 lb (70.8 kg)      Studies/Labs Reviewed:   EKG:  EKG is not ordered today.    Recent Labs: 10/27/2017: NT-Pro BNP 167 11/16/2017: BUN 19; Creatinine, Ser 0.91; Hemoglobin 13.0; Platelets 266; Potassium 3.2; Sodium 133   Lipid Panel    Component Value Date/Time   CHOL 224 (H) 07/28/2016 0930   CHOL 209 (H) 11/22/2013 0232   TRIG 162 (H) 07/28/2016 0930   TRIG 99 11/22/2013 0232   HDL 57 07/28/2016 0930   HDL 56 11/22/2013 0232   CHOLHDL 3.9 07/28/2016 0930   VLDL 32 (H) 07/28/2016 0930   VLDL 20 11/22/2013 0232   LDLCALC 135 (H) 07/28/2016 0930   LDLCALC 133 (H) 11/22/2013 0232    Additional studies/ records that were reviewed today include:   Echo 11/02/2017 LV EF: 55% -   60%  ------------------------------------------------------------------- Indications:      Dyspnea (R06.09).  ------------------------------------------------------------------- History:   PMH:  Deep Vein Thrombosis, Hypertrophic Cardiomyopathy, Edema, Bradycardia, Obstructive Sleep Apnea.  Dyspnea.  Risk factors:  Family history of coronary artery disease. Hypertension.   ------------------------------------------------------------------- Study Conclusions  - Left ventricle: The cavity size was normal. There was mild focal   basal hypertrophy of the septum. Systolic function was normal.   The estimated ejection fraction was in the range of 55% to 60%.   Wall motion was normal; there were no regional wall motion   abnormalities. Features are  consistent with a pseudonormal left   ventricular filling pattern, with concomitant abnormal relaxation   and increased filling pressure (grade 2 diastolic dysfunction).   Doppler parameters are consistent with high ventricular filling   pressure. - Aortic valve: Transvalvular velocity was within the normal range.   There was no stenosis. There was mild regurgitation. Valve area   (VTI): 3.84 cm^2. Valve area (Vmax): 3.23 cm^2. Valve area   (Vmean): 3.55 cm^2. - Mitral valve: Transvalvular velocity was within the normal range.   There was no evidence for stenosis. There was mild regurgitation. - Left atrium: The atrium was mildly dilated. - Right ventricle: The cavity size was normal. Wall thickness was   normal. Systolic function was normal. - Atrial septum: No defect or patent foramen ovale was identified   by color flow Doppler. - Tricuspid valve: There was mild regurgitation. - Pulmonary arteries: Systolic pressure was within the normal   range. PA peak pressure: 29 mm Hg (S).  Impressions:  - Mild hypertrophy of the basal septum. No left ventricular outflow   tract gradient noted.     ASSESSMENT:    1. Dyspnea, unspecified type   2. HOCM (hypertrophic obstructive cardiomyopathy) ([place])   3. Essential hypertension   4. Hyperlipidemia, unspecified hyperlipidemia type   5. Laryngospasm      PLAN:  In order of problems listed above:  1. Dyspnea: Recent work-up was negative for cardiac component.  Degree of HOCM does not explain her current symptoms.  She has no coronary artery disease on recent coronary CT.  Continue observation at this point  2. Holcomb: Stable on recent echocardiogram  3. Hypertension: Blood pressure mildly elevated today, add hydralazine 10 mg 3 times daily  4. Hyperlipidemia: on Lipitor 20 mg daily.  Will defer annual lipid panel to primary care provider.  5. Laryngospasm: Managed by Dr. Tami Ribas of ENT.  Continue to have occasional  stridor    Medication  Adjustments/Labs and Tests Ordered: Current medicines are reviewed at length with the patient today.  Concerns regarding medicines are outlined above.  Medication changes, Labs and Tests ordered today are listed in the Patient Instructions below. Patient Instructions  Medication Instructions: START Hydralazine 10 mg three times a day  If you need a refill on your cardiac medications before your next appointment, please call your pharmacy.    Follow-Up: Your physician wants you to follow-up in 6-9 months with Dr. Debara Pickett. You will receive a reminder letter in the mail two months in advance. If you don't receive a letter, please call our office at [phone] to schedule this follow-up appointment.   Thank you for choosing [facility], [place]ra [place], [place]  11/26/2017 11:49 PM    [address], [place], [place]  [address] Phone: [phone]; Fax: [phone]

## 2017-11-25 ENCOUNTER — Other Ambulatory Visit: Payer: Self-pay | Admitting: Physician Assistant

## 2017-11-25 MED ORDER — HYDRALAZINE HCL 10 MG PO TABS: 10.0000 mg | ORAL_TABLET | Freq: Three times a day (TID) | ORAL | 1 refills | Status: DC

## 2017-11-25 NOTE — Telephone Encounter (Signed)
New Message     *STAT* If patient is at the pharmacy, call can be transferred to refill team.   1. Which medications need to be refilled? (please list name of each medication and dose if known) hydrALAZINE (APRESOLINE) 10 MG tablet  2. Which pharmacy/location (including street and city if local pharmacy) is medication to be sent to? [place], [place] 3. Do they need a 30 day or 90 day supply? 30     Patient is calling to get a refill the refill request went to the incorrect pharmacy initially.

## 2017-11-25 NOTE — Telephone Encounter (Signed)
Rx request sent to pharmacy.  

## 2017-11-26 ENCOUNTER — Encounter: Payer: Self-pay | Admitting: Physician Assistant

## 2017-12-01 ENCOUNTER — Other Ambulatory Visit: Payer: Self-pay | Admitting: Internal Medicine

## 2017-12-01 DIAGNOSIS — J385 Laryngeal spasm: Secondary | ICD-10-CM | POA: Diagnosis not present

## 2017-12-01 DIAGNOSIS — J309 Allergic rhinitis, unspecified: Secondary | ICD-10-CM | POA: Diagnosis not present

## 2017-12-01 DIAGNOSIS — J45991 Cough variant asthma: Secondary | ICD-10-CM | POA: Diagnosis not present

## 2017-12-01 DIAGNOSIS — Z1231 Encounter for screening mammogram for malignant neoplasm of breast: Secondary | ICD-10-CM

## 2017-12-01 DIAGNOSIS — J305 Allergic rhinitis due to food: Secondary | ICD-10-CM | POA: Diagnosis not present

## 2017-12-06 DIAGNOSIS — R0609 Other forms of dyspnea: Secondary | ICD-10-CM | POA: Diagnosis not present

## 2017-12-08 NOTE — Progress Notes (Deleted)
[facility] Clinic Note - Initial Visit   Date: 12/08/17  [name] MRN: [id number] DOB: 04-18-1951   Dear Dr. Fara Olden:  Thank you for your kind referral of [name] for consultation of neuropathy. Although her history is well known to you, please allow [place] to reiterate it for the purpose of our medical record. The patient was accompanied to the clinic by *** who also provides collateral information.     History of Present Illness: [name] is a 67 y.o. ***-handed Caucasian/*** ***female with HTN, HLD, HOCM, OSA, h/o DVT presenting for evaluation of ***.    Out-side paper records, electronic medical record, and images have been reviewed where available and summarized as: *** MRI lumbar spine 10/03/2003: Multilevel degenerative spondylotic changes.  Multiple sites of stenosis in general predominately involving the central canal and lateral recesses at L3-4 and L4-5.  HNP central and to the left at L4-5.  See comments above.  Lab Results  Component Value Date   TSH 1.174 07/02/2016     Past Medical History:  Diagnosis Date  . Bradycardia   . Chest pain   . Constipation   . Depression   . Diverticulitis   . Diverticulosis of colon   . DOE (dyspnea on exertion)   . DVT (deep venous thrombosis) ([place])   . Esophageal stricture   . Hyperlipemia   . Hypertension   . IBS (irritable bowel syndrome)   . Sleep apnea     Past Surgical History:  Procedure Laterality Date  . APPENDECTOMY    . BLOOD CLOT  2004   RIGHT LEG  . CHOLECYSTECTOMY    . COLON SURGERY    . GALLBLADDER SURGERY  2003  . NISSEN FUNDOPLICATION    . PLANTAR FASCIA SURGERY  2007  . sigmoid colectomy  10/21/2010   pelvic anastomosis  . VAGINAL HYSTERECTOMY       Medications:  Outpatient Encounter Medications as of 12/09/2017  Medication Sig  . albuterol (PROVENTIL HFA;VENTOLIN HFA) 108 (90 Base) MCG/ACT inhaler Inhale 2 puffs into the lungs every 6 (six) hours  as needed for wheezing or shortness of breath.  Marland Kitchen aspirin 81 MG tablet Take 1 tablet (81 mg total) by mouth daily.  Marland Kitchen atorvastatin (LIPITOR) 20 MG tablet Take 20 mg by mouth daily.  . Cholecalciferol (VITAMIN D-3 PO) Take 1 capsule by mouth daily.  . citalopram (CELEXA) 10 MG tablet Take 1 tablet by mouth at bedtime.  . fluticasone (FLOVENT HFA) 220 MCG/ACT inhaler Inhale 2 puffs into the lungs 2 (two) times daily. Can substitute any steroid inhaler best covered by insurance  . hydrALAZINE (APRESOLINE) 10 MG tablet Take 1 tablet (10 mg total) by mouth 3 (three) times daily.  . hydrochlorothiazide (HYDRODIURIL) 25 MG tablet Take 1 tablet (25 mg total) by mouth daily.  . metoprolol (LOPRESSOR) 25 MG tablet Take 1 tablet (25 mg total) by mouth 2 (two) times daily.  . pantoprazole (PROTONIX) 40 MG tablet Take 1 tablet (40 mg total) by mouth daily before breakfast.  . ranitidine (ZANTAC) 300 MG capsule Take 300 mg by mouth at bedtime.   No facility-administered encounter medications on file as of 12/09/2017.      Allergies:  Allergies  Allergen Reactions  . Dicyclomine Other (See Comments)    REACTION: Choking  . Lisinopril Other (See Comments)    REACTION: Choking  . Losartan Shortness Of Breath  . Ace Inhibitors Other (See Comments)    Unknown to  patient  . Penicillins Itching and Rash    Family History: Family History  Problem Relation Age of Onset  . Heart disease Mother   . Crohn's disease Sister   . Colon polyps Sister   . Irritable bowel syndrome Brother   . Brain cancer Father   . Colitis Sister   . Breast cancer Cousin   . Colon cancer Neg Hx     Social History: Social History   Tobacco Use  . Smoking status: Never Smoker  . Smokeless tobacco: Never Used  Substance Use Topics  . Alcohol use: No  . Drug use: No   Social History   Social History Narrative   Epworth Sleepiness Score = 9 (03/08/2015)    Review of Systems:  CONSTITUTIONAL: No fevers, chills,  night sweats, or weight loss.  *** EYES: No visual changes or eye pain ENT: No hearing changes.  No history of nose bleeds.   RESPIRATORY: No cough, wheezing and shortness of breath.   CARDIOVASCULAR: Negative for chest pain, and palpitations.   GI: Negative for abdominal discomfort, blood in stools or black stools.  No recent change in bowel habits.   GU:  No history of incontinence.   MUSCLOSKELETAL: No history of joint pain or swelling.  No myalgias.   SKIN: Negative for lesions, rash, and itching.   HEMATOLOGY/ONCOLOGY: Negative for prolonged bleeding, bruising easily, and swollen nodes.  No history of cancer.   ENDOCRINE: Negative for cold or heat intolerance, polydipsia or goiter.   PSYCH:  ***depression or anxiety symptoms.   NEURO: As Above.   Vital Signs:  There were no vitals taken for this visit. Pain Scale: *** on a scale of 0-10   General Medical Exam:  *** General:  Well appearing, comfortable.   Eyes/ENT: see cranial nerve examination.   Neck: No masses appreciated.  Full range of motion without tenderness.  No carotid bruits. Respiratory:  Clear to auscultation, good air entry bilaterally.   Cardiac:  Regular rate and rhythm, no murmur.   Extremities:  No deformities, edema, or skin discoloration.  Skin:  No rashes or lesions.  Neurological Exam: MENTAL STATUS including orientation to time, place, person, recent and remote memory, attention span and concentration, language, and fund of knowledge is ***normal.  Speech is not dysarthric.  CRANIAL NERVES: II:  No visual field defects.  Unremarkable fundi.   III-IV-VI: Pupils equal round and reactive to light.  Normal conjugate, extra-ocular eye movements in all directions of gaze.  No nystagmus.  No ptosis***.   V:  Normal facial sensation.  Jaw jerk is ***.   VII:  Normal facial symmetry and movements.  No pathologic facial reflexes.  VIII:  Normal hearing and vestibular function.   IX-X:  Normal palatal movement.     XI:  Normal shoulder shrug and head rotation.   XII:  Normal tongue strength and range of motion, no deviation or fasciculation.  MOTOR:  No atrophy, fasciculations or abnormal movements.  No pronator drift.  Tone is normal.    Right Upper Extremity:    Left Upper Extremity:    Deltoid  5/5   Deltoid  5/5   Biceps  5/5   Biceps  5/5   Triceps  5/5   Triceps  5/5   Wrist extensors  5/5   Wrist extensors  5/5   Wrist flexors  5/5   Wrist flexors  5/5   Finger extensors  5/5   Finger extensors  5/5   Finger  flexors  5/5   Finger flexors  5/5   Dorsal interossei  5/5   Dorsal interossei  5/5   Abductor pollicis  5/5   Abductor pollicis  5/5   Tone (Ashworth scale)  0  Tone (Ashworth scale)  0   Right Lower Extremity:    Left Lower Extremity:    Hip flexors  5/5   Hip flexors  5/5   Hip extensors  5/5   Hip extensors  5/5   Knee flexors  5/5   Knee flexors  5/5   Knee extensors  5/5   Knee extensors  5/5   Dorsiflexors  5/5   Dorsiflexors  5/5   Plantarflexors  5/5   Plantarflexors  5/5   Toe extensors  5/5   Toe extensors  5/5   Toe flexors  5/5   Toe flexors  5/5   Tone (Ashworth scale)  0  Tone (Ashworth scale)  0   MSRs:  Right                                                                 Left brachioradialis 2+  brachioradialis 2+  biceps 2+  biceps 2+  triceps 2+  triceps 2+  patellar 2+  patellar 2+  ankle jerk 2+  ankle jerk 2+  Hoffman no  Hoffman no  plantar response down  plantar response down   SENSORY:  Normal and symmetric perception of light touch, pinprick, vibration, and proprioception.  Romberg's sign absent.   COORDINATION/GAIT: Normal finger-to- nose-finger and heel-to-shin.  Intact rapid alternating movements bilaterally.  Able to rise from a chair without using arms.  Gait narrow based and stable. Tandem and stressed gait intact.    IMPRESSION: ***  PLAN/RECOMMENDATIONS:  *** Return to clinic in *** months.   The duration of this appointment  visit was *** minutes of face-to-face time with the patient.  Greater than 50% of this time was spent in counseling, explanation of diagnosis, planning of further management, and coordination of care.   Thank you for allowing me to participate in patient's care.  If I can answer any additional questions, I would be pleased to do so.    Sincerely,    Donika K. Posey Pronto, DO

## 2017-12-09 ENCOUNTER — Ambulatory Visit: Payer: PPO | Admitting: Neurology

## 2017-12-10 ENCOUNTER — Ambulatory Visit: Payer: PPO | Admitting: Neurology

## 2017-12-24 ENCOUNTER — Ambulatory Visit
Admission: RE | Admit: 2017-12-24 | Discharge: 2017-12-24 | Disposition: A | Discharge status: Home / Self Care | Payer: PPO | Source: Ambulatory Visit | Attending: Internal Medicine | Admitting: Internal Medicine

## 2017-12-24 DIAGNOSIS — Z1231 Encounter for screening mammogram for malignant neoplasm of breast: Secondary | ICD-10-CM | POA: Diagnosis not present

## 2018-01-26 DIAGNOSIS — I10 Essential (primary) hypertension: Secondary | ICD-10-CM | POA: Diagnosis not present

## 2018-01-26 DIAGNOSIS — J449 Chronic obstructive pulmonary disease, unspecified: Secondary | ICD-10-CM | POA: Diagnosis not present

## 2018-01-26 DIAGNOSIS — Z23 Encounter for immunization: Secondary | ICD-10-CM | POA: Diagnosis not present

## 2018-01-26 DIAGNOSIS — L219 Seborrheic dermatitis, unspecified: Secondary | ICD-10-CM | POA: Diagnosis not present

## 2018-01-28 DIAGNOSIS — J449 Chronic obstructive pulmonary disease, unspecified: Secondary | ICD-10-CM | POA: Diagnosis not present

## 2018-01-28 DIAGNOSIS — E785 Hyperlipidemia, unspecified: Secondary | ICD-10-CM | POA: Diagnosis not present

## 2018-01-28 DIAGNOSIS — I10 Essential (primary) hypertension: Secondary | ICD-10-CM | POA: Diagnosis not present

## 2018-03-03 DIAGNOSIS — J45991 Cough variant asthma: Secondary | ICD-10-CM | POA: Diagnosis not present

## 2018-03-03 DIAGNOSIS — H903 Sensorineural hearing loss, bilateral: Secondary | ICD-10-CM | POA: Diagnosis not present

## 2018-03-11 ENCOUNTER — Ambulatory Visit (INDEPENDENT_AMBULATORY_CARE_PROVIDER_SITE_OTHER): Payer: PPO | Admitting: Neurology

## 2018-03-11 ENCOUNTER — Encounter: Payer: Self-pay | Admitting: Neurology

## 2018-03-11 VITALS — BP 140/68 | HR 54 | Ht 61.0 in | Wt 153.4 lb

## 2018-03-11 DIAGNOSIS — G959 Disease of spinal cord, unspecified: Secondary | ICD-10-CM

## 2018-03-11 DIAGNOSIS — R202 Paresthesia of skin: Secondary | ICD-10-CM | POA: Diagnosis not present

## 2018-03-11 NOTE — Progress Notes (Addendum)
[facility] Clinic Note - Initial Visit   Date: 03/11/18  [name] MRN: [id number] DOB: 1950-07-30   Dear Dr. Fara Olden:  Thank you for your kind referral of [name] for consultation of bilateral feet pain. Although her history is well known to you, please allow [place] to reiterate it for the purpose of our medical record. The patient was accompanied to the clinic by self.    History of Present Illness: [name] is a 67 y.o. right-handed Caucasian female with hypertension, panic attacks, anxiety/depression, irritable bowel syndrome, insomnia, celiac disease, hyperlipidemia, and GERD presenting for evaluation of bilateral feet pain.    Starting in early 2019, she began having numbness and cold sensation of the feet. Symptoms are constant in the right foot and intermittent on the left.  She has mild imbalance.  She does not have weakness and walks unassisted.  She wears socks which helps keep the feet warm.  Prolonged walking and causes her feet to be worse.  She also has a deep bone pain in the legs. No leg cramps or spasms.  He has history of low back pain and had ESI to her lumbar spine in 2005.  MRI lumbar spine in 2005 showed mild multilevel degenerative changes with central canal stenosis at L3-4 and L4-5.    Past Medical History:  Diagnosis Date  . Bradycardia   . Chest pain   . Constipation   . Depression   . Diverticulitis   . Diverticulosis of colon   . DOE (dyspnea on exertion)   . DVT (deep venous thrombosis) ([place])   . Esophageal stricture   . Hyperlipemia   . Hypertension   . IBS (irritable bowel syndrome)   . Sleep apnea     Past Surgical History:  Procedure Laterality Date  . APPENDECTOMY    . BLOOD CLOT  2004   RIGHT LEG  . CHOLECYSTECTOMY    . COLON SURGERY    . GALLBLADDER SURGERY  2003  . NISSEN FUNDOPLICATION    . PLANTAR FASCIA SURGERY  2007  . sigmoid colectomy  10/21/2010   pelvic anastomosis  .  VAGINAL HYSTERECTOMY       Medications:  Outpatient Encounter Medications as of 03/11/2018  Medication Sig  . albuterol (PROVENTIL HFA;VENTOLIN HFA) 108 (90 Base) MCG/ACT inhaler Inhale 2 puffs into the lungs every 6 (six) hours as needed for wheezing or shortness of breath.  . ALPRAZolam (XANAX) 0.25 MG tablet TAKE ONE-HALF TO ONE TABLET BY MOUTH ONCE DAILY AS NEEDED  . aspirin 81 MG tablet Take 1 tablet (81 mg total) by mouth daily.  Marland Kitchen atorvastatin (LIPITOR) 20 MG tablet Take 20 mg by mouth daily.  . Cholecalciferol (VITAMIN D-3 PO) Take 1 capsule by mouth daily.  . citalopram (CELEXA) 10 MG tablet Take 1 tablet by mouth at bedtime.  . fluticasone (FLOVENT HFA) 220 MCG/ACT inhaler Inhale 2 puffs into the lungs 2 (two) times daily. Can substitute any steroid inhaler best covered by insurance  . hydrochlorothiazide (HYDRODIURIL) 25 MG tablet Take 1 tablet (25 mg total) by mouth daily.  Marland Kitchen ketoconazole (NIZORAL) 2 % shampoo APPLY TO THE SCALP ONCE DAILY  . metoprolol (LOPRESSOR) 25 MG tablet Take 1 tablet (25 mg total) by mouth 2 (two) times daily.  . metoprolol succinate (TOPROL-XL) 25 MG 24 hr tablet TAKE 1 TABLET BY MOUTH TWICE DAILY FOR 90 DAYS  . pantoprazole (PROTONIX) 40 MG tablet Take 1 tablet (40 mg total) by mouth  daily before breakfast.  . ranitidine (ZANTAC) 300 MG capsule Take 300 mg by mouth at bedtime.  . hydrALAZINE (APRESOLINE) 10 MG tablet Take 1 tablet (10 mg total) by mouth 3 (three) times daily.   No facility-administered encounter medications on file as of 03/11/2018.      Allergies:  Allergies  Allergen Reactions  . Dicyclomine Other (See Comments)    REACTION: Choking  . Lisinopril Other (See Comments)    REACTION: Choking  . Losartan Shortness Of Breath  . Ace Inhibitors Other (See Comments)    Unknown to patient  . Penicillins Itching and Rash    Family History: Family History  Problem Relation Age of Onset  . Heart disease Mother   . Crohn's disease  Sister   . Colon polyps Sister   . Irritable bowel syndrome Brother   . Brain cancer Father   . Colitis Sister   . Diabetes Sister   . Breast cancer Cousin   . Colon cancer Neg Hx     Social History: Social History   Tobacco Use  . Smoking status: Never Smoker  . Smokeless tobacco: Never Used  Substance Use Topics  . Alcohol use: No  . Drug use: No   Social History   Social History Narrative   Epworth Sleepiness Score = 9 (03/08/2015)   Lives alone in a one story home.  Has one daughter.  Retired.  Education: GED    Review of Systems:  CONSTITUTIONAL: No fevers, chills, night sweats, or weight loss.   EYES: No visual changes or eye pain ENT: No hearing changes.  No history of nose bleeds.   RESPIRATORY: No cough, wheezing and shortness of breath.   CARDIOVASCULAR: Negative for chest pain, and palpitations.   GI: Negative for abdominal discomfort, blood in stools or black stools.  No recent change in bowel habits.   GU:  No history of incontinence.   MUSCLOSKELETAL: No history of joint pain or swelling.  No myalgias.   SKIN: Negative for lesions, rash, and itching.   HEMATOLOGY/ONCOLOGY: Negative for prolonged bleeding, bruising easily, and swollen nodes.  No history of cancer.   ENDOCRINE: Negative for cold or heat intolerance, polydipsia or goiter.   PSYCH:  No depression or anxiety symptoms.   NEURO: As Above.   Vital Signs:  BP 140/68   Pulse (!) 54   Ht 5\' 1"  (1.549 m)   Wt 153 lb 6 oz (69.6 kg)   SpO2 98%   BMI 28.98 kg/m    General Medical Exam:   General:  Well appearing, comfortable.   Eyes/ENT: see cranial nerve examination.   Neck: No masses appreciated.  Full range of motion without tenderness.  No carotid bruits. Respiratory:  Clear to auscultation, good air entry bilaterally.   Cardiac:  Regular rate and rhythm, no murmur.   Extremities:  No deformities, edema, or skin discoloration.  Skin:  No rashes or lesions.  Neurological Exam: MENTAL  STATUS including orientation to time, place, person, recent and remote memory, attention span and concentration, language, and fund of knowledge is normal.  Speech is not dysarthric.  CRANIAL NERVES: II:  No visual field defects.  Unremarkable fundi.   III-IV-VI: Pupils equal round and reactive to light.  Normal conjugate, extra-ocular eye movements in all directions of gaze.  No nystagmus.  No ptosis.   V:  Normal facial sensation.    VII:  Normal facial symmetry and movements. VIII:  Normal hearing and vestibular function.   IX-X:  Normal palatal movement.   XI:  Normal shoulder shrug and head rotation.   XII:  Normal tongue strength and range of motion, no deviation or fasciculation.  MOTOR:  No atrophy, fasciculations or abnormal movements.  No pronator drift.  Tone is normal.    Right Upper Extremity:    Left Upper Extremity:    Deltoid  5/5   Deltoid  5/5   Biceps  5/5   Biceps  5/5   Triceps  5/5   Triceps  5/5   Wrist extensors  5/5   Wrist extensors  5/5   Wrist flexors  5/5   Wrist flexors  5/5   Finger extensors  5/5   Finger extensors  5/5   Finger flexors  5/5   Finger flexors  5/5   Dorsal interossei  5/5   Dorsal interossei  5/5   Abductor pollicis  5/5   Abductor pollicis  5/5   Tone (Ashworth scale)  0  Tone (Ashworth scale)  0   Right Lower Extremity:    Left Lower Extremity:    Hip flexors  5/5   Hip flexors  5/5   Hip extensors  5/5   Hip extensors  5/5   Knee flexors  5/5   Knee flexors  5/5   Knee extensors  5/5   Knee extensors  5/5   Dorsiflexors  5/5   Dorsiflexors  5/5   Plantarflexors  5/5   Plantarflexors  5/5   Toe extensors  5/5   Toe extensors  5/5   Toe flexors  5/5   Toe flexors  5/5   Tone (Ashworth scale)  0  Tone (Ashworth scale)  0   MSRs:  Right                                                                 Left brachioradialis 2+  brachioradialis 2+  biceps 2+  biceps 2+  triceps 2+  triceps 2+  patellar 3+  patellar 3+  ankle jerk 2+   ankle jerk 2+  Hoffman no  Hoffman no  plantar response down  plantar response down  2-3 beat ankle clonus bilaterally.  SENSORY:  Normal and symmetric perception of light touch, pinprick, vibration, and proprioception.  Romberg's sign absent.   COORDINATION/GAIT: Normal finger-to- nose-finger and heel-to-shin.  Intact rapid alternating movements bilaterally.  Able to rise from a chair without using arms.  Gait narrow based and stable. Stressed gait intact, mild unsteadiness with tandem gait.  DATA: MRI lumbar spine 10/03/2003:  Multilevel degenerative spondylotic changes.  Multiple sites of stenosis in general predominately involving the central canal and lateral recesses at L3-4 and L4-5.  HNP central and to the left at L4-5.  See comments above.  IMPRESSION: Bilateral feet paresthesias most likely due to worsening lumbar canal stenosis.  Exam is most suggestive of lumbar myelopathy as reflexes are brisk and there is 2-3 beat ankle clonus.  She has normal strength and sensation in the feet on objective exam making neuropathy less likely.   I have reviewed her MRI lumbar spine from 2005 which shows mild multilevel degenerative changes with lateral recess canal stenosis at L3-4 and L4-5.  Updated MRI lumbar spine will be ordered to evaluate for compressive myelopathy.  Further recommendations pending these results.   Thank you for allowing me to participate in patient's care.  If I can answer any additional questions, I would be pleased to do so.    Sincerely,    Donika K. Posey Pronto, DO

## 2018-03-11 NOTE — Patient Instructions (Addendum)
MRI lumbar spine without contrast. We will call you with the results and let you know the next step. °

## 2018-03-26 ENCOUNTER — Ambulatory Visit
Admission: RE | Admit: 2018-03-26 | Discharge: 2018-03-26 | Disposition: A | Discharge status: Home / Self Care | Payer: PPO | Source: Ambulatory Visit | Attending: Neurology | Admitting: Neurology

## 2018-03-26 DIAGNOSIS — G959 Disease of spinal cord, unspecified: Secondary | ICD-10-CM

## 2018-03-26 DIAGNOSIS — M48061 Spinal stenosis, lumbar region without neurogenic claudication: Secondary | ICD-10-CM | POA: Diagnosis not present

## 2018-03-28 ENCOUNTER — Telehealth: Payer: Self-pay | Admitting: *Deleted

## 2018-03-28 ENCOUNTER — Other Ambulatory Visit: Payer: Self-pay | Admitting: *Deleted

## 2018-03-28 DIAGNOSIS — G959 Disease of spinal cord, unspecified: Secondary | ICD-10-CM

## 2018-03-28 DIAGNOSIS — R202 Paresthesia of skin: Secondary | ICD-10-CM

## 2018-03-28 NOTE — Telephone Encounter (Signed)
-----  Message from Donika K Patel, DO sent at 03/28/2018  8:18 AM EST ----- Please inform patient that her MRI lumbar spine is overall stable, as compared to 2005 - there is mild age-related changes with some areas of narrowing, but nothing which is severe enough to cause her feet discomfort.  Recommend NCS/EMG of the legs to see if this is stemming from neuropathy in her feet and check labs - ESR, vitamin B12, folate, copper, zinc, MMA.  Thanks.   

## 2018-03-28 NOTE — Telephone Encounter (Signed)
Patient given results and instructions.  Will have EMG/NCS set up along with lab work.

## 2018-03-28 NOTE — Telephone Encounter (Signed)
Patient coming in tomorrow for EMG and labs.

## 2018-03-28 NOTE — Telephone Encounter (Signed)
-----  Message from Alda Berthold, DO sent at 03/28/2018  8:18 AM EST ----- Please inform patient that her MRI lumbar spine is overall stable, as compared to 2005 - there is mild age-related changes with some areas of narrowing, but nothing which is severe enough to cause her feet discomfort.  Recommend NCS/EMG of the legs to see if this is stemming from neuropathy in her feet and check labs - ESR, vitamin B12, folate, copper, zinc, MMA.  Thanks.

## 2018-03-28 NOTE — Telephone Encounter (Signed)
Left message for patient to call me back. 

## 2018-03-29 ENCOUNTER — Encounter: Payer: PPO | Admitting: Neurology

## 2018-04-01 DIAGNOSIS — I10 Essential (primary) hypertension: Secondary | ICD-10-CM | POA: Diagnosis not present

## 2018-04-01 DIAGNOSIS — E785 Hyperlipidemia, unspecified: Secondary | ICD-10-CM | POA: Diagnosis not present

## 2018-04-01 DIAGNOSIS — J449 Chronic obstructive pulmonary disease, unspecified: Secondary | ICD-10-CM | POA: Diagnosis not present

## 2018-04-14 DIAGNOSIS — J449 Chronic obstructive pulmonary disease, unspecified: Secondary | ICD-10-CM | POA: Diagnosis not present

## 2018-04-14 DIAGNOSIS — E785 Hyperlipidemia, unspecified: Secondary | ICD-10-CM | POA: Diagnosis not present

## 2018-04-14 DIAGNOSIS — I10 Essential (primary) hypertension: Secondary | ICD-10-CM | POA: Diagnosis not present

## 2018-04-15 DIAGNOSIS — R05 Cough: Secondary | ICD-10-CM | POA: Diagnosis not present

## 2018-04-15 DIAGNOSIS — J069 Acute upper respiratory infection, unspecified: Secondary | ICD-10-CM | POA: Diagnosis not present

## 2018-04-28 ENCOUNTER — Ambulatory Visit (INDEPENDENT_AMBULATORY_CARE_PROVIDER_SITE_OTHER): Payer: PPO | Admitting: Neurology

## 2018-04-28 DIAGNOSIS — G959 Disease of spinal cord, unspecified: Secondary | ICD-10-CM | POA: Diagnosis not present

## 2018-04-28 DIAGNOSIS — R202 Paresthesia of skin: Secondary | ICD-10-CM | POA: Diagnosis not present

## 2018-04-28 NOTE — Progress Notes (Signed)
Follow-up Visit   Date: 04/28/18 \   [name] MRN: [id number] DOB: [birth year]   Interim History: [name] is a 67 y.o.  right-handed Caucasian female with hypertension, panic attacks, anxiety/depression, irritable bowel syndrome, insomnia, celiac disease, hyperlipidemia, and GERD returning to the clinic for follow-up of bilateral feet numbness.  The patient was accompanied to the clinic by self.  History of present illness: Starting in early 2019, she began having numbness and cold sensation of the feet. Symptoms are constant in the right foot and intermittent on the left.  She has mild imbalance.  She does not have weakness and walks unassisted.  She wears socks which helps keep the feet warm.  Prolonged walking and causes her feet to be worse.  She also has a deep bone pain in the legs. No leg cramps or spasms.  He has history of low back pain and had ESI to her lumbar spine in 2005.  MRI lumbar spine in 2005 showed mild multilevel degenerative changes with central canal stenosis at L3-4 and L4-5.  UPDATE 04/28/2018:  She continues to have numbness and cold sensation of the feet.  She often wears socks to keep them comfortable.  MRI lumbar spine shows mild degenerative change, nothing which would cause her feet pain.  Medications:  Current Outpatient Medications on File Prior to Visit  Medication Sig Dispense Refill  . albuterol (PROVENTIL HFA;VENTOLIN HFA) 108 (90 Base) MCG/ACT inhaler Inhale 2 puffs into the lungs every 6 (six) hours as needed for wheezing or shortness of breath. 1 Inhaler 0  . ALPRAZolam (XANAX) 0.25 MG tablet TAKE ONE-HALF TO ONE TABLET BY MOUTH ONCE DAILY AS NEEDED    . aspirin 81 MG tablet Take 1 tablet (81 mg total) by mouth daily.    Marland Kitchen atorvastatin (LIPITOR) 20 MG tablet Take 20 mg by mouth daily.    . Cholecalciferol (VITAMIN D-3 PO) Take 1 capsule by mouth daily.    . citalopram (CELEXA) 10 MG tablet Take 1 tablet by mouth at bedtime.  2  .  fluticasone (FLOVENT HFA) 220 MCG/ACT inhaler Inhale 2 puffs into the lungs 2 (two) times daily. Can substitute any steroid inhaler best covered by insurance 1 Inhaler 0  . hydrALAZINE (APRESOLINE) 10 MG tablet Take 1 tablet (10 mg total) by mouth 3 (three) times daily. 270 tablet 1  . hydrochlorothiazide (HYDRODIURIL) 25 MG tablet Take 1 tablet (25 mg total) by mouth daily. 30 tablet 3  . ketoconazole (NIZORAL) 2 % shampoo APPLY TO THE SCALP ONCE DAILY  2  . metoprolol (LOPRESSOR) 25 MG tablet Take 1 tablet (25 mg total) by mouth 2 (two) times daily. 180 tablet 3  . metoprolol succinate (TOPROL-XL) 25 MG 24 hr tablet TAKE 1 TABLET BY MOUTH TWICE DAILY FOR 90 DAYS  1  . pantoprazole (PROTONIX) 40 MG tablet Take 1 tablet (40 mg total) by mouth daily before breakfast. 30 tablet 0  . ranitidine (ZANTAC) 300 MG capsule Take 300 mg by mouth at bedtime.  1   No current facility-administered medications on file prior to visit.     Allergies:  Allergies  Allergen Reactions  . Dicyclomine Other (See Comments)    REACTION: Choking  . Lisinopril Other (See Comments)    REACTION: Choking  . Losartan Shortness Of Breath  . Ace Inhibitors Other (See Comments)    Unknown to patient  . Penicillins Itching and Rash    Review of Systems:  CONSTITUTIONAL: No fevers, chills,  night sweats, or weight loss.  EYES: No visual changes or eye pain ENT: No hearing changes.  No history of nose bleeds.   RESPIRATORY: No cough, wheezing and shortness of breath.   CARDIOVASCULAR: Negative for chest pain, and palpitations.   GI: Negative for abdominal discomfort, blood in stools or black stools.  No recent change in bowel habits.   GU:  No history of incontinence.   MUSCLOSKELETAL: No history of joint pain or swelling.  No myalgias.   SKIN: Negative for lesions, rash, and itching.   ENDOCRINE: Negative for cold or heat intolerance, polydipsia or goiter.   PSYCH:  No depression or anxiety symptoms.   NEURO: As  Above.   Vital Signs:  There were no vitals taken for this visit.   General: Well appearing Ext:  Warm, distal pulses intact  Neurological Exam: MENTAL STATUS including orientation to time, place, person, recent and remote memory, attention span and concentration, language, and fund of knowledge is normal.  Speech is not dysarthric.  CRANIAL NERVES Face is symmetric.   MOTOR:  Motor strength is 5/5 in all extremities  COORDINATION/GAIT:    Gait narrow based and stable.   Data: MRI lumbar spine 03/27/2018: Mildly progressive lumbar disc degeneration most notable at L3-4 where there is moderate right and mild left lateral recess and mild bilateral neural foraminal stenosis.   IMPRESSION/PLAN Bilateral feet paresthesias.  With her brisk reflexes and intact sensation on exam, there was concern of lumbar myelopathy however her MRI did not show progression of canal stenosis.  She underwent NCS/EMG of the leg which is normal, no evidence of neuropathy.  She continues to have feet numbness and is concerned about distal circulation.  She has intact pulses so my overall suspicion is low for peripheral arterial disease however given lack of explanation for her symptoms, plan to proceed with arterial studies of the legs.      Thank you for allowing me to participate in patient's care.  If I can answer any additional questions, I would be pleased to do so.    Sincerely,    Donika K. Posey Pronto, DO

## 2018-04-28 NOTE — Procedures (Signed)
[facility]  [address]  [place], [place] [address] Tel: [phone] Fax:  [phone] Test Date:  04/28/2018  Patient: [name] DOB: 05/18/1950 Physician: Narda Amber, DO  Sex: Female Height: 5\' 1"  Ref Phys: Narda Amber, DO  ID#: [id number] Temp: 34.0C Technician:    Patient Complaints: This is a 67 year old female referred for evaluation of bilateral feet pain and numbness.  NCV & EMG Findings: Electrodiagnostic testing of the right lower extremity and additional studies of the left shows:  1. Bilateral sural and superficial peroneal sensory responses are within normal limits.   2. Bilateral peroneal and tibial motor responses are within normal limits.  3. Bilateral tibial H reflex studies are within normal limits.   4. There is no evidence of active or chronic motor axonal changes affecting any of the tested muscles.  Motor unit configuration and recruitment pattern is within normal limits.  Impression: This is a normal study of the lower extremities.  In particular, there is no evidence of a sensorimotor polyneuropathy or lumbosacral radiculopathy.     ___________________________ Narda Amber, DO    Nerve Conduction Studies Anti Sensory Summary Table   Site NR Peak (ms) Norm Peak (ms) P-T Amp (V) Norm P-T Amp  Left Sup Peroneal Anti Sensory (Ant Lat Mall)  34C  12 cm    2.2 <4.6 10.5 >3  Right Sup Peroneal Anti Sensory (Ant Lat Mall)  34C  12 cm    2.1 <4.6 9.5 >3  Left Sural Anti Sensory (Lat Mall)  34C  Calf    3.0 <4.6 9.0 >3  Right Sural Anti Sensory (Lat Mall)  34C  Calf    2.6 <4.6 9.0 >3   Motor Summary Table   Site NR Onset (ms) Norm Onset (ms) O-P Amp (mV) Norm O-P Amp Site1 Site2 Delta-0 (ms) Dist (cm) Vel (m/s) Norm Vel (m/s)  Left Peroneal Motor (Ext Dig Brev)  34C  Ankle    3.0 <6.0 7.5 >2.5 B Fib Ankle 7.1 35.0 49 >40  B Fib    10.1  6.9  Poplt B Fib 1.3 7.0 54 >40  Poplt    11.4  6.8         Right Peroneal  Motor (Ext Dig Brev)  34C  Ankle    2.8 <6.0 9.8 >2.5 B Fib Ankle 7.1 35.0 49 >40  B Fib    9.9  9.3  Poplt B Fib 1.2 7.0 58 >40  Poplt    11.1  9.2         Left Tibial Motor (Abd Hall Brev)  34C  Ankle    4.5 <6.0 23.4 >4 Knee Ankle 7.4 35.0 47 >40  Knee    11.9  20.0         Right Tibial Motor (Abd Hall Brev)  34C  Ankle    3.4 <6.0 23.9 >4 Knee Ankle 7.9 40.0 51 >40  Knee    11.3  20.1          H Reflex Studies   NR H-Lat (ms) Lat Norm (ms) L-R H-Lat (ms)  Left Tibial (Gastroc)  34C     29.25 <35 0.00  Right Tibial (Gastroc)  34C     29.25 <35 0.00   EMG   Side Muscle Ins Act Fibs Psw Fasc Number Recrt Dur Dur. Amp Amp. Poly Poly. Comment  Right AntTibialis Nml Nml Nml Nml Nml Nml Nml Nml Nml Nml Nml Nml N/A  Right Gastroc Nml  Nml Nml Nml Nml Nml Nml Nml Nml Nml Nml Nml N/A  Right Flex Dig Long Nml Nml Nml Nml Nml Nml Nml Nml Nml Nml Nml Nml N/A  Right RectFemoris Nml Nml Nml Nml Nml Nml Nml Nml Nml Nml Nml Nml N/A  Right GluteusMed Nml Nml Nml Nml Nml Nml Nml Nml Nml Nml Nml Nml N/A      Waveforms:

## 2018-04-29 ENCOUNTER — Other Ambulatory Visit: Payer: Self-pay | Admitting: *Deleted

## 2018-04-29 DIAGNOSIS — R202 Paresthesia of skin: Secondary | ICD-10-CM

## 2018-04-29 NOTE — Progress Notes (Signed)
Referral sent to [place].

## 2018-05-09 ENCOUNTER — Ambulatory Visit
Admission: RE | Admit: 2018-05-09 | Discharge: 2018-05-09 | Disposition: A | Discharge status: Home / Self Care | Payer: PPO | Source: Ambulatory Visit | Attending: Neurology | Admitting: Neurology

## 2018-05-09 DIAGNOSIS — R202 Paresthesia of skin: Secondary | ICD-10-CM

## 2018-05-09 DIAGNOSIS — R200 Anesthesia of skin: Secondary | ICD-10-CM | POA: Diagnosis not present

## 2018-05-10 ENCOUNTER — Encounter: Payer: Self-pay | Admitting: *Deleted

## 2018-05-10 ENCOUNTER — Telehealth: Payer: Self-pay | Admitting: *Deleted

## 2018-05-10 NOTE — Telephone Encounter (Signed)
Results sent via My Chart.  

## 2018-05-10 NOTE — Telephone Encounter (Signed)
-----   Message from Alda Berthold, DO sent at 05/09/2018  4:13 PM EST ----- Please inform patient that her circulation to the legs is normal.  Thanks.

## 2018-05-23 DIAGNOSIS — I10 Essential (primary) hypertension: Secondary | ICD-10-CM | POA: Diagnosis not present

## 2018-05-23 DIAGNOSIS — Z23 Encounter for immunization: Secondary | ICD-10-CM | POA: Diagnosis not present

## 2018-05-23 DIAGNOSIS — F4322 Adjustment disorder with anxiety: Secondary | ICD-10-CM | POA: Diagnosis not present

## 2018-05-23 DIAGNOSIS — G4733 Obstructive sleep apnea (adult) (pediatric): Secondary | ICD-10-CM | POA: Diagnosis not present

## 2018-05-23 DIAGNOSIS — I700 Atherosclerosis of aorta: Secondary | ICD-10-CM | POA: Diagnosis not present

## 2018-05-23 DIAGNOSIS — K219 Gastro-esophageal reflux disease without esophagitis: Secondary | ICD-10-CM | POA: Diagnosis not present

## 2018-05-23 DIAGNOSIS — E785 Hyperlipidemia, unspecified: Secondary | ICD-10-CM | POA: Diagnosis not present

## 2018-05-23 DIAGNOSIS — J410 Simple chronic bronchitis: Secondary | ICD-10-CM | POA: Diagnosis not present

## 2018-06-08 DIAGNOSIS — J410 Simple chronic bronchitis: Secondary | ICD-10-CM | POA: Diagnosis not present

## 2018-06-08 DIAGNOSIS — J449 Chronic obstructive pulmonary disease, unspecified: Secondary | ICD-10-CM | POA: Diagnosis not present

## 2018-06-08 DIAGNOSIS — E785 Hyperlipidemia, unspecified: Secondary | ICD-10-CM | POA: Diagnosis not present

## 2018-06-08 DIAGNOSIS — I10 Essential (primary) hypertension: Secondary | ICD-10-CM | POA: Diagnosis not present

## 2018-07-05 DIAGNOSIS — J410 Simple chronic bronchitis: Secondary | ICD-10-CM | POA: Diagnosis not present

## 2018-07-05 DIAGNOSIS — I10 Essential (primary) hypertension: Secondary | ICD-10-CM | POA: Diagnosis not present

## 2018-07-05 DIAGNOSIS — E785 Hyperlipidemia, unspecified: Secondary | ICD-10-CM | POA: Diagnosis not present

## 2018-07-05 DIAGNOSIS — J449 Chronic obstructive pulmonary disease, unspecified: Secondary | ICD-10-CM | POA: Diagnosis not present

## 2018-08-03 DIAGNOSIS — E785 Hyperlipidemia, unspecified: Secondary | ICD-10-CM | POA: Diagnosis not present

## 2018-08-03 DIAGNOSIS — J410 Simple chronic bronchitis: Secondary | ICD-10-CM | POA: Diagnosis not present

## 2018-08-03 DIAGNOSIS — I10 Essential (primary) hypertension: Secondary | ICD-10-CM | POA: Diagnosis not present

## 2018-08-03 DIAGNOSIS — J449 Chronic obstructive pulmonary disease, unspecified: Secondary | ICD-10-CM | POA: Diagnosis not present

## 2018-10-20 DIAGNOSIS — M79671 Pain in right foot: Secondary | ICD-10-CM | POA: Diagnosis not present

## 2018-10-20 DIAGNOSIS — M7671 Peroneal tendinitis, right leg: Secondary | ICD-10-CM | POA: Diagnosis not present

## 2018-10-20 DIAGNOSIS — G5761 Lesion of plantar nerve, right lower limb: Secondary | ICD-10-CM | POA: Diagnosis not present

## 2018-10-20 DIAGNOSIS — M7731 Calcaneal spur, right foot: Secondary | ICD-10-CM | POA: Diagnosis not present

## 2018-11-02 DIAGNOSIS — F5101 Primary insomnia: Secondary | ICD-10-CM | POA: Diagnosis not present

## 2018-11-02 DIAGNOSIS — K900 Celiac disease: Secondary | ICD-10-CM | POA: Diagnosis not present

## 2018-11-02 DIAGNOSIS — F4322 Adjustment disorder with anxiety: Secondary | ICD-10-CM | POA: Diagnosis not present

## 2018-11-02 DIAGNOSIS — K219 Gastro-esophageal reflux disease without esophagitis: Secondary | ICD-10-CM | POA: Diagnosis not present

## 2018-11-02 DIAGNOSIS — I10 Essential (primary) hypertension: Secondary | ICD-10-CM | POA: Diagnosis not present

## 2018-11-02 DIAGNOSIS — Z1389 Encounter for screening for other disorder: Secondary | ICD-10-CM | POA: Diagnosis not present

## 2018-11-02 DIAGNOSIS — G4733 Obstructive sleep apnea (adult) (pediatric): Secondary | ICD-10-CM | POA: Diagnosis not present

## 2018-11-02 DIAGNOSIS — E785 Hyperlipidemia, unspecified: Secondary | ICD-10-CM | POA: Diagnosis not present

## 2018-11-02 DIAGNOSIS — J449 Chronic obstructive pulmonary disease, unspecified: Secondary | ICD-10-CM | POA: Diagnosis not present

## 2018-11-02 DIAGNOSIS — Z0000 Encounter for general adult medical examination without abnormal findings: Secondary | ICD-10-CM | POA: Diagnosis not present

## 2018-11-03 DIAGNOSIS — J410 Simple chronic bronchitis: Secondary | ICD-10-CM | POA: Diagnosis not present

## 2018-11-03 DIAGNOSIS — E7800 Pure hypercholesterolemia, unspecified: Secondary | ICD-10-CM | POA: Diagnosis not present

## 2018-11-03 DIAGNOSIS — E785 Hyperlipidemia, unspecified: Secondary | ICD-10-CM | POA: Diagnosis not present

## 2018-11-03 DIAGNOSIS — J449 Chronic obstructive pulmonary disease, unspecified: Secondary | ICD-10-CM | POA: Diagnosis not present

## 2018-11-03 DIAGNOSIS — I10 Essential (primary) hypertension: Secondary | ICD-10-CM | POA: Diagnosis not present

## 2018-11-04 DIAGNOSIS — I10 Essential (primary) hypertension: Secondary | ICD-10-CM | POA: Diagnosis not present

## 2018-11-04 DIAGNOSIS — E785 Hyperlipidemia, unspecified: Secondary | ICD-10-CM | POA: Diagnosis not present

## 2018-11-17 DIAGNOSIS — G5761 Lesion of plantar nerve, right lower limb: Secondary | ICD-10-CM | POA: Diagnosis not present

## 2018-12-01 DIAGNOSIS — H04123 Dry eye syndrome of bilateral lacrimal glands: Secondary | ICD-10-CM | POA: Diagnosis not present

## 2018-12-07 IMAGING — MG DIGITAL SCREENING BILATERAL MAMMOGRAM WITH TOMO AND CAD
8 series · 8 of 24 positions shown · non-contrast
Comparison: Previous exam(s).

CLINICAL DATA: Screening.

EXAM:
DIGITAL SCREENING BILATERAL MAMMOGRAM WITH TOMO AND CAD

[L CC synth-2D]
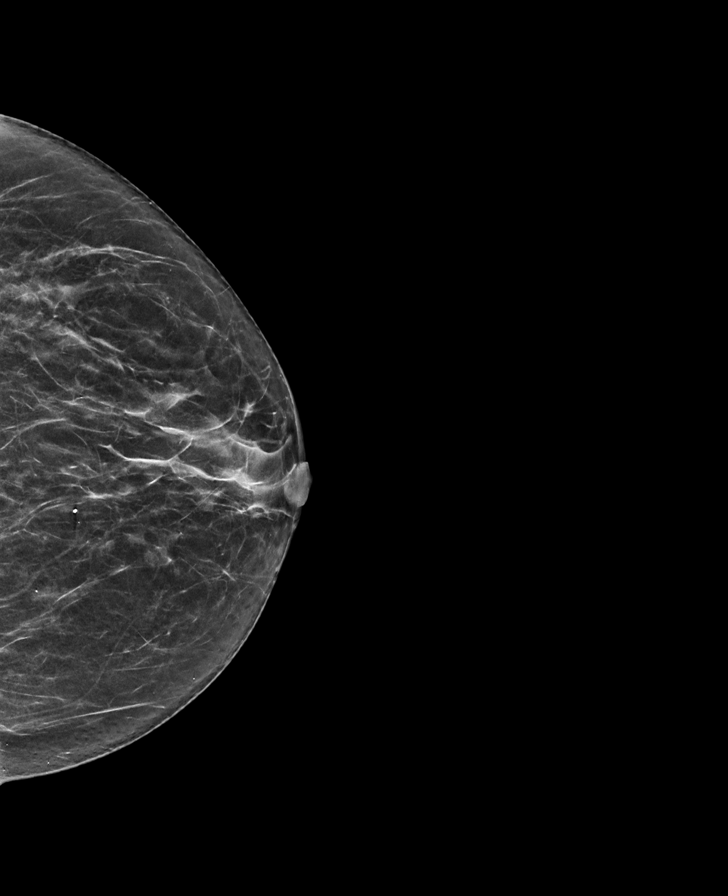

[R MLO synth-2D]
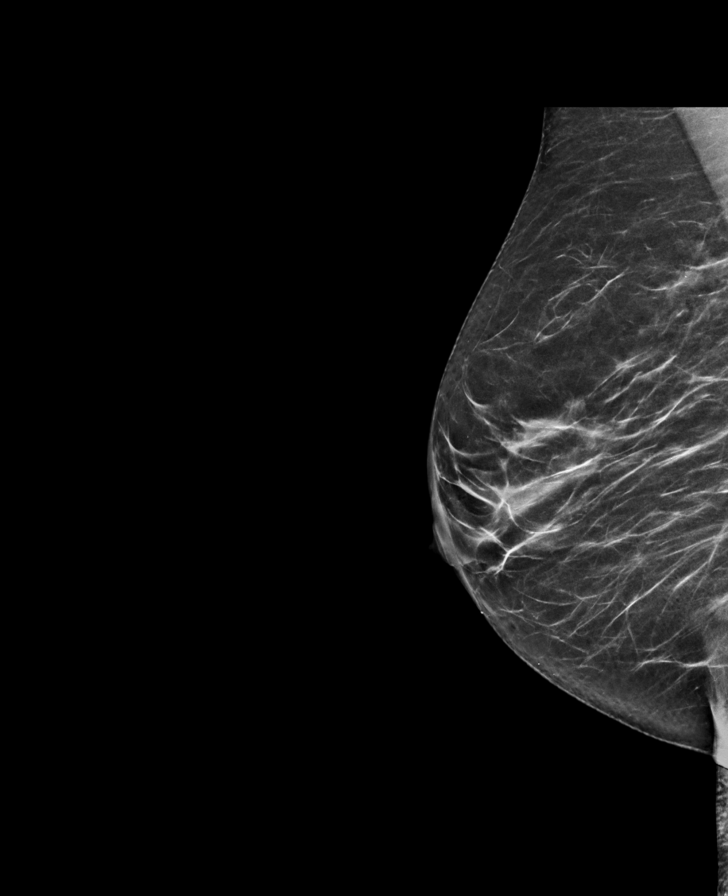

[L MLO synth-2D]
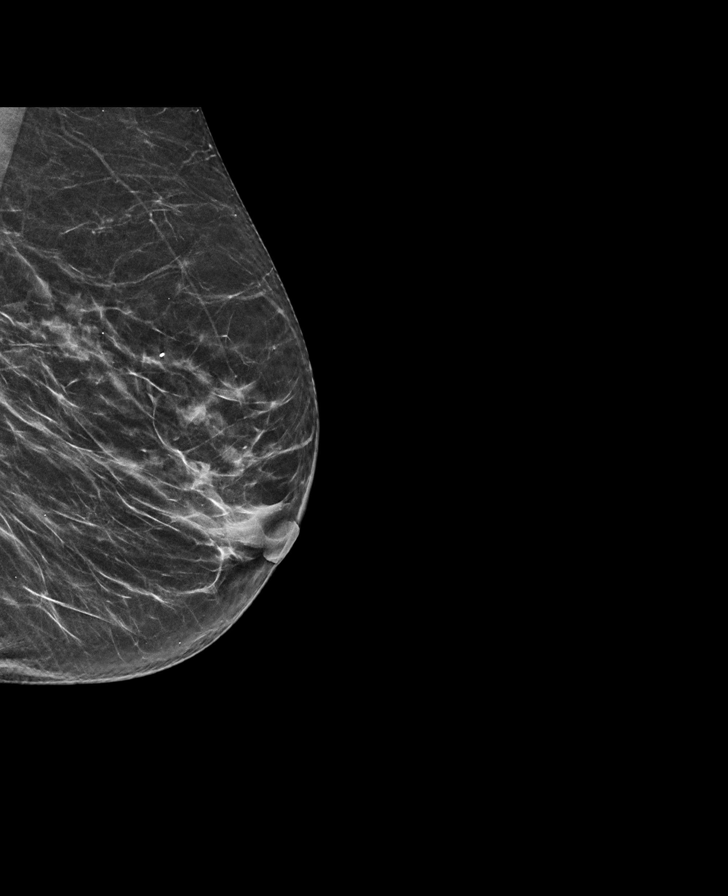

[R CC synth-2D]
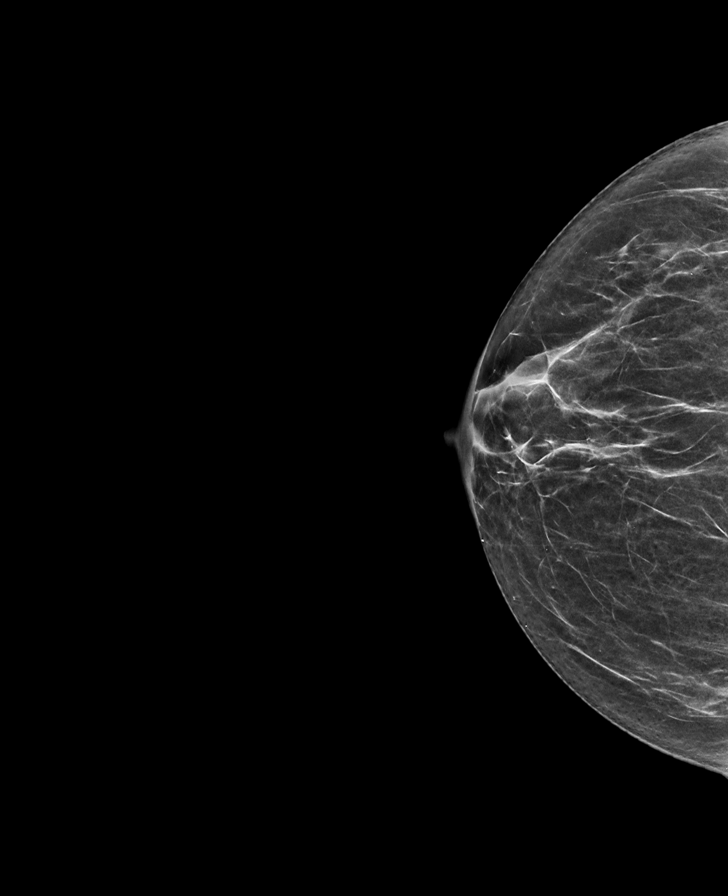

[R MLO tomo · tomo slice 33/64.0]
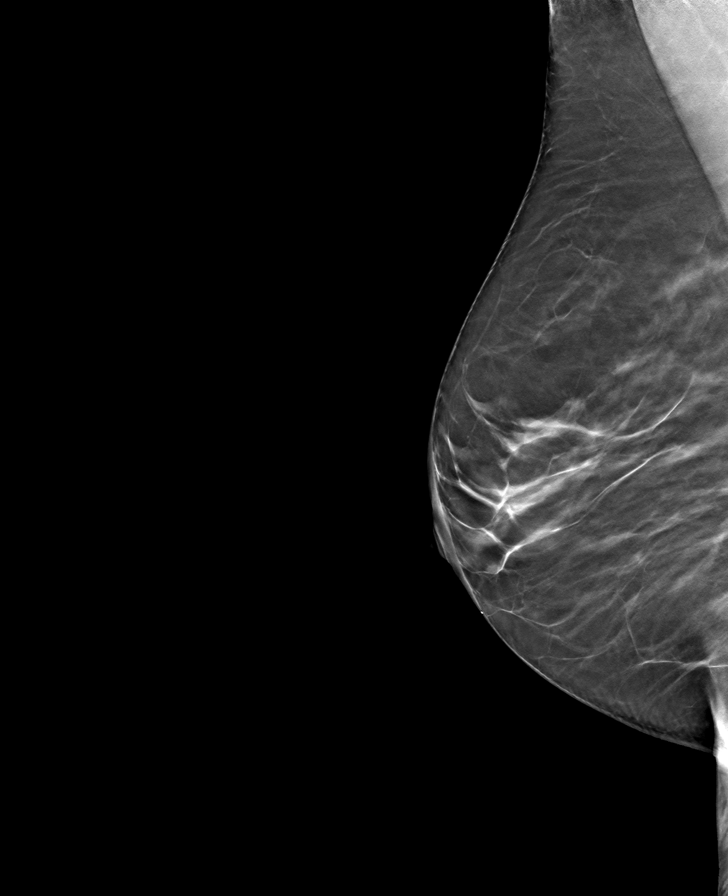

[L MLO tomo · tomo slice 31/62.0]
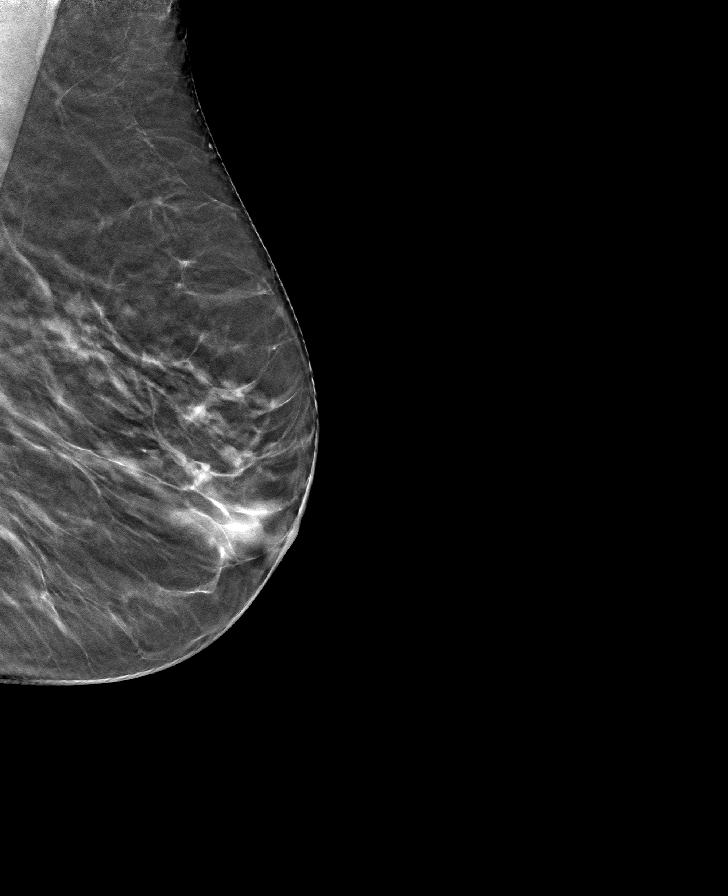

[R CC tomo · tomo slice 29/57.0]
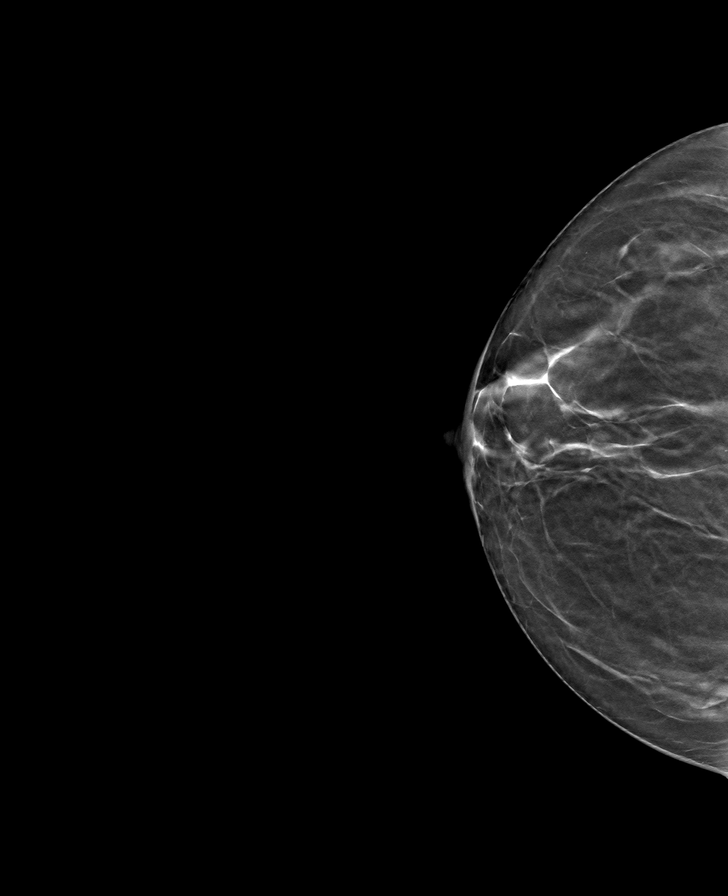

[L CC tomo · tomo slice 29/58.0]
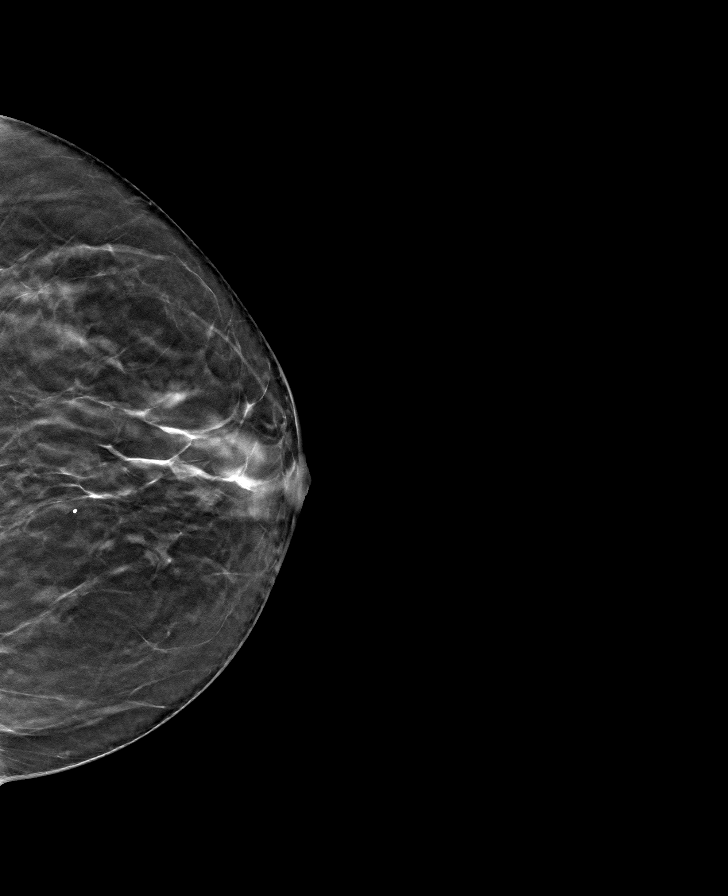

[8 of 24 positions shown; findings below may reference images not displayed]

ACR Breast Density Category b: There are scattered areas of
fibroglandular density.
FINDINGS: There are no findings suspicious for malignancy. Images were
processed with CAD.
IMPRESSION: No mammographic evidence of malignancy. A result letter of this
screening mammogram will be mailed directly to the patient.

RECOMMENDATION:
Screening mammogram in one year. (Code:CN-U-775)

BI-RADS CATEGORY  1: Negative.

## 2019-01-01 DIAGNOSIS — I10 Essential (primary) hypertension: Secondary | ICD-10-CM | POA: Diagnosis not present

## 2019-01-01 DIAGNOSIS — J410 Simple chronic bronchitis: Secondary | ICD-10-CM | POA: Diagnosis not present

## 2019-01-01 DIAGNOSIS — E785 Hyperlipidemia, unspecified: Secondary | ICD-10-CM | POA: Diagnosis not present

## 2019-01-01 DIAGNOSIS — J449 Chronic obstructive pulmonary disease, unspecified: Secondary | ICD-10-CM | POA: Diagnosis not present

## 2019-01-26 DIAGNOSIS — E7800 Pure hypercholesterolemia, unspecified: Secondary | ICD-10-CM | POA: Diagnosis not present

## 2019-01-26 DIAGNOSIS — E785 Hyperlipidemia, unspecified: Secondary | ICD-10-CM | POA: Diagnosis not present

## 2019-01-26 DIAGNOSIS — J410 Simple chronic bronchitis: Secondary | ICD-10-CM | POA: Diagnosis not present

## 2019-01-26 DIAGNOSIS — J449 Chronic obstructive pulmonary disease, unspecified: Secondary | ICD-10-CM | POA: Diagnosis not present

## 2019-01-26 DIAGNOSIS — I10 Essential (primary) hypertension: Secondary | ICD-10-CM | POA: Diagnosis not present

## 2019-02-01 ENCOUNTER — Other Ambulatory Visit: Payer: Self-pay | Admitting: *Deleted

## 2019-02-01 DIAGNOSIS — Z20822 Contact with and (suspected) exposure to covid-19: Secondary | ICD-10-CM

## 2019-02-01 DIAGNOSIS — R6889 Other general symptoms and signs: Secondary | ICD-10-CM | POA: Diagnosis not present

## 2019-02-03 LAB — NOVEL CORONAVIRUS, NAA: SARS-CoV-2, NAA: NOT DETECTED

## 2019-02-27 DIAGNOSIS — E785 Hyperlipidemia, unspecified: Secondary | ICD-10-CM | POA: Diagnosis not present

## 2019-02-27 DIAGNOSIS — J449 Chronic obstructive pulmonary disease, unspecified: Secondary | ICD-10-CM | POA: Diagnosis not present

## 2019-02-27 DIAGNOSIS — I10 Essential (primary) hypertension: Secondary | ICD-10-CM | POA: Diagnosis not present

## 2019-03-07 ENCOUNTER — Emergency Department: Payer: PPO

## 2019-03-07 ENCOUNTER — Inpatient Hospital Stay
Admit: 2019-03-07 | Discharge: 2019-03-07 | Disposition: A | Discharge status: Home / Self Care | Payer: PPO | Attending: Family Medicine | Admitting: Family Medicine

## 2019-03-07 ENCOUNTER — Inpatient Hospital Stay
Admission: EM | Admit: 2019-03-07 | Discharge: 2019-03-09 | DRG: 392 | Disposition: A | Discharge status: Home / Self Care | Payer: PPO | Attending: Internal Medicine | Admitting: Internal Medicine

## 2019-03-07 ENCOUNTER — Other Ambulatory Visit: Payer: Self-pay

## 2019-03-07 DIAGNOSIS — I4891 Unspecified atrial fibrillation: Secondary | ICD-10-CM | POA: Diagnosis not present

## 2019-03-07 DIAGNOSIS — Z9049 Acquired absence of other specified parts of digestive tract: Secondary | ICD-10-CM

## 2019-03-07 DIAGNOSIS — Z808 Family history of malignant neoplasm of other organs or systems: Secondary | ICD-10-CM

## 2019-03-07 DIAGNOSIS — I10 Essential (primary) hypertension: Secondary | ICD-10-CM | POA: Diagnosis not present

## 2019-03-07 DIAGNOSIS — Z8249 Family history of ischemic heart disease and other diseases of the circulatory system: Secondary | ICD-10-CM | POA: Diagnosis not present

## 2019-03-07 DIAGNOSIS — R002 Palpitations: Secondary | ICD-10-CM | POA: Diagnosis present

## 2019-03-07 DIAGNOSIS — E876 Hypokalemia: Secondary | ICD-10-CM

## 2019-03-07 DIAGNOSIS — R509 Fever, unspecified: Secondary | ICD-10-CM

## 2019-03-07 DIAGNOSIS — E785 Hyperlipidemia, unspecified: Secondary | ICD-10-CM | POA: Diagnosis present

## 2019-03-07 DIAGNOSIS — Z833 Family history of diabetes mellitus: Secondary | ICD-10-CM | POA: Diagnosis not present

## 2019-03-07 DIAGNOSIS — F329 Major depressive disorder, single episode, unspecified: Secondary | ICD-10-CM | POA: Diagnosis present

## 2019-03-07 DIAGNOSIS — Z803 Family history of malignant neoplasm of breast: Secondary | ICD-10-CM | POA: Diagnosis not present

## 2019-03-07 DIAGNOSIS — R000 Tachycardia, unspecified: Secondary | ICD-10-CM | POA: Diagnosis not present

## 2019-03-07 DIAGNOSIS — Z7982 Long term (current) use of aspirin: Secondary | ICD-10-CM

## 2019-03-07 DIAGNOSIS — K529 Noninfective gastroenteritis and colitis, unspecified: Secondary | ICD-10-CM | POA: Diagnosis not present

## 2019-03-07 DIAGNOSIS — Z20828 Contact with and (suspected) exposure to other viral communicable diseases: Secondary | ICD-10-CM | POA: Diagnosis not present

## 2019-03-07 DIAGNOSIS — Z8371 Family history of colonic polyps: Secondary | ICD-10-CM

## 2019-03-07 DIAGNOSIS — R0789 Other chest pain: Secondary | ICD-10-CM | POA: Diagnosis not present

## 2019-03-07 DIAGNOSIS — Z880 Allergy status to penicillin: Secondary | ICD-10-CM

## 2019-03-07 DIAGNOSIS — I471 Supraventricular tachycardia: Secondary | ICD-10-CM | POA: Diagnosis present

## 2019-03-07 DIAGNOSIS — Z90710 Acquired absence of both cervix and uterus: Secondary | ICD-10-CM

## 2019-03-07 DIAGNOSIS — G4730 Sleep apnea, unspecified: Secondary | ICD-10-CM | POA: Diagnosis not present

## 2019-03-07 DIAGNOSIS — Z888 Allergy status to other drugs, medicaments and biological substances status: Secondary | ICD-10-CM

## 2019-03-07 DIAGNOSIS — R069 Unspecified abnormalities of breathing: Secondary | ICD-10-CM | POA: Diagnosis not present

## 2019-03-07 DIAGNOSIS — Z79899 Other long term (current) drug therapy: Secondary | ICD-10-CM | POA: Diagnosis not present

## 2019-03-07 DIAGNOSIS — R531 Weakness: Secondary | ICD-10-CM | POA: Diagnosis not present

## 2019-03-07 DIAGNOSIS — Z7951 Long term (current) use of inhaled steroids: Secondary | ICD-10-CM

## 2019-03-07 DIAGNOSIS — N200 Calculus of kidney: Secondary | ICD-10-CM | POA: Diagnosis not present

## 2019-03-07 DIAGNOSIS — I480 Paroxysmal atrial fibrillation: Secondary | ICD-10-CM | POA: Diagnosis not present

## 2019-03-07 DIAGNOSIS — R079 Chest pain, unspecified: Secondary | ICD-10-CM

## 2019-03-07 DIAGNOSIS — A084 Viral intestinal infection, unspecified: Principal | ICD-10-CM | POA: Diagnosis present

## 2019-03-07 DIAGNOSIS — R6510 Systemic inflammatory response syndrome (SIRS) of non-infectious origin without acute organ dysfunction: Secondary | ICD-10-CM | POA: Diagnosis not present

## 2019-03-07 DIAGNOSIS — R110 Nausea: Secondary | ICD-10-CM

## 2019-03-07 DIAGNOSIS — M5489 Other dorsalgia: Secondary | ICD-10-CM | POA: Diagnosis not present

## 2019-03-07 LAB — COMPREHENSIVE METABOLIC PANEL
ALT: 38 U/L (ref 0–44)
AST: 47 U/L — ABNORMAL HIGH (ref 15–41)
Albumin: 4.1 g/dL (ref 3.5–5.0)
Alkaline Phosphatase: 95 U/L (ref 38–126)
Anion gap: 11 (ref 5–15)
BUN: 11 mg/dL (ref 8–23)
CO2: 26 mmol/L (ref 22–32)
Calcium: 8.8 mg/dL — ABNORMAL LOW (ref 8.9–10.3)
Chloride: 96 mmol/L — ABNORMAL LOW (ref 98–111)
Creatinine, Ser: 0.73 mg/dL (ref 0.44–1.00)
GFR calc Af Amer: >60 mL/min (ref >60)
GFR calc non Af Amer: >60 mL/min (ref >60)
Glucose, Bld: 113 mg/dL — ABNORMAL HIGH (ref 70–99)
Potassium: 2.9 mmol/L — ABNORMAL LOW (ref 3.5–5.1)
Sodium: 133 mmol/L — ABNORMAL LOW (ref 135–145)
Total Bilirubin: 1 mg/dL (ref 0.3–1.2)
Total Protein: 6.9 g/dL (ref 6.5–8.1)

## 2019-03-07 LAB — LIPASE, BLOOD: Lipase: 22 U/L (ref 11–51)

## 2019-03-07 LAB — MAGNESIUM: Magnesium: 1.8 mg/dL (ref 1.7–2.4)

## 2019-03-07 LAB — PROCALCITONIN: Procalcitonin: <0.1 ng/mL

## 2019-03-07 LAB — CBC WITH DIFFERENTIAL/PLATELET
Abs Immature Granulocytes: 0.07 10*3/uL (ref 0.00–0.07)
Basophils Absolute: 0 10*3/uL (ref 0.0–0.1)
Basophils Relative: 0 %
Eosinophils Absolute: 0 10*3/uL (ref 0.0–0.5)
Eosinophils Relative: 0 %
HCT: 35.8 % — ABNORMAL LOW (ref 36.0–46.0)
Hemoglobin: 12.3 g/dL (ref 12.0–15.0)
Immature Granulocytes: 1 %
Lymphocytes Relative: 5 %
Lymphs Abs: 0.8 10*3/uL (ref 0.7–4.0)
MCH: 30.6 pg (ref 26.0–34.0)
MCHC: 34.4 g/dL (ref 30.0–36.0)
MCV: 89.1 fL (ref 80.0–100.0)
Monocytes Absolute: 1 10*3/uL (ref 0.1–1.0)
Monocytes Relative: 6 %
Neutro Abs: 13.2 10*3/uL — ABNORMAL HIGH (ref 1.7–7.7)
Neutrophils Relative %: 88 %
Platelets: 230 10*3/uL (ref 150–400)
RBC: 4.02 MIL/uL (ref 3.87–5.11)
RDW: 13.3 % (ref 11.5–15.5)
WBC: 15 10*3/uL — ABNORMAL HIGH (ref 4.0–10.5)
nRBC: 0 % (ref 0.0–0.2)

## 2019-03-07 LAB — LACTIC ACID, PLASMA
Lactic Acid, Venous: 1 mmol/L (ref 0.5–1.9)
Lactic Acid, Venous: 1 mmol/L (ref 0.5–1.9)

## 2019-03-07 LAB — URINALYSIS, ROUTINE W REFLEX MICROSCOPIC
Bilirubin Urine: NEGATIVE
Glucose, UA: NEGATIVE mg/dL
Hgb urine dipstick: NEGATIVE
Ketones, ur: 5 mg/dL — AB
Leukocytes,Ua: NEGATIVE
Nitrite: NEGATIVE
Protein, ur: NEGATIVE mg/dL
Specific Gravity, Urine: 1.011 (ref 1.005–1.030)
pH: 7 (ref 5.0–8.0)

## 2019-03-07 LAB — TROPONIN I (HIGH SENSITIVITY)
Troponin I (High Sensitivity): 21 ng/L — ABNORMAL HIGH (ref <18)
Troponin I (High Sensitivity): 61 ng/L — ABNORMAL HIGH (ref <18)
Troponin I (High Sensitivity): 52 ng/L — ABNORMAL HIGH (ref <18)

## 2019-03-07 LAB — HIV ANTIBODY (ROUTINE TESTING W REFLEX): HIV Screen 4th Generation wRfx: NONREACTIVE

## 2019-03-07 LAB — C DIFFICILE QUICK SCREEN W PCR REFLEX
C Diff antigen: NEGATIVE
C Diff interpretation: NOT DETECTED
C Diff toxin: NEGATIVE

## 2019-03-07 LAB — SARS CORONAVIRUS 2 (TAT 6-24 HRS): SARS Coronavirus 2: NEGATIVE

## 2019-03-07 MED ORDER — LORAZEPAM 2 MG/ML IJ SOLN
1.0000 mg | Freq: Four times a day (QID) | INTRAMUSCULAR | Status: DC | PRN
  Administered 2019-03-07 – 2019-03-08 (×2): 1 mg via INTRAVENOUS
  Filled 2019-03-07 (×2): qty 1

## 2019-03-07 MED ORDER — ENOXAPARIN SODIUM 40 MG/0.4ML ~~LOC~~ SOLN
40.0000 mg | SUBCUTANEOUS | Status: DC
  Administered 2019-03-08 – 2019-03-09 (×2): 40 mg via SUBCUTANEOUS
  Filled 2019-03-07 (×3): qty 0.4

## 2019-03-07 MED ORDER — SODIUM CHLORIDE 0.9 % IV SOLN
INTRAVENOUS | Status: DC
  Administered 2019-03-07 – 2019-03-09 (×4): via INTRAVENOUS

## 2019-03-07 MED ORDER — HYDRALAZINE HCL 10 MG PO TABS
10.0000 mg | ORAL_TABLET | Freq: Three times a day (TID) | ORAL | Status: DC
  Administered 2019-03-07 – 2019-03-09 (×6): 10 mg via ORAL
  Filled 2019-03-07 (×10): qty 1

## 2019-03-07 MED ORDER — METOPROLOL SUCCINATE ER 25 MG PO TB24
25.0000 mg | ORAL_TABLET | Freq: Every day | ORAL | Status: DC
  Administered 2019-03-07 – 2019-03-09 (×3): 25 mg via ORAL
  Filled 2019-03-07 (×3): qty 1

## 2019-03-07 MED ORDER — ASPIRIN EC 81 MG PO TBEC
81.0000 mg | DELAYED_RELEASE_TABLET | Freq: Every day | ORAL | Status: DC
  Administered 2019-03-08 – 2019-03-09 (×2): 81 mg via ORAL
  Filled 2019-03-07 (×3): qty 1

## 2019-03-07 MED ORDER — POTASSIUM CHLORIDE 20 MEQ PO PACK
40.0000 meq | PACK | Freq: Once | ORAL | Status: AC
  Administered 2019-03-07: 40 meq via ORAL
  Filled 2019-03-07: qty 2

## 2019-03-07 MED ORDER — PROMETHAZINE HCL 25 MG/ML IJ SOLN
12.5000 mg | Freq: Four times a day (QID) | INTRAMUSCULAR | Status: DC | PRN
  Administered 2019-03-07 – 2019-03-08 (×2): 12.5 mg via INTRAVENOUS
  Filled 2019-03-07 (×2): qty 1

## 2019-03-07 MED ORDER — VITAMIN D 25 MCG (1000 UNIT) PO TABS
1000.0000 [IU] | ORAL_TABLET | Freq: Every day | ORAL | Status: DC
  Administered 2019-03-08 – 2019-03-09 (×2): 1000 [IU] via ORAL
  Filled 2019-03-07 (×3): qty 1

## 2019-03-07 MED ORDER — ATORVASTATIN CALCIUM 20 MG PO TABS
20.0000 mg | ORAL_TABLET | Freq: Every day | ORAL | Status: DC
  Administered 2019-03-07 – 2019-03-09 (×3): 20 mg via ORAL
  Filled 2019-03-07 (×3): qty 1

## 2019-03-07 MED ORDER — HYDROCHLOROTHIAZIDE 25 MG PO TABS
25.0000 mg | ORAL_TABLET | Freq: Every day | ORAL | Status: DC
  Administered 2019-03-08 – 2019-03-09 (×2): 25 mg via ORAL
  Filled 2019-03-07 (×4): qty 1

## 2019-03-07 MED ORDER — POTASSIUM CHLORIDE CRYS ER 20 MEQ PO TBCR
40.0000 meq | EXTENDED_RELEASE_TABLET | Freq: Once | ORAL | Status: AC
  Administered 2019-03-07: 40 meq via ORAL
  Filled 2019-03-07: qty 2

## 2019-03-07 MED ORDER — PANTOPRAZOLE SODIUM 40 MG PO TBEC
40.0000 mg | DELAYED_RELEASE_TABLET | Freq: Every day | ORAL | Status: DC
  Administered 2019-03-08 – 2019-03-09 (×2): 40 mg via ORAL
  Filled 2019-03-07 (×3): qty 1

## 2019-03-07 MED ORDER — ONDANSETRON HCL 4 MG/2ML IJ SOLN
4.0000 mg | Freq: Four times a day (QID) | INTRAMUSCULAR | Status: DC | PRN
  Administered 2019-03-07 – 2019-03-08 (×3): 4 mg via INTRAVENOUS
  Filled 2019-03-07 (×3): qty 2

## 2019-03-07 MED ORDER — MAGNESIUM SULFATE 2 GM/50ML IV SOLN
2.0000 g | Freq: Once | INTRAVENOUS | Status: AC
  Administered 2019-03-07: 2 g via INTRAVENOUS
  Filled 2019-03-07: qty 50

## 2019-03-07 MED ORDER — ONDANSETRON HCL 4 MG/2ML IJ SOLN
4.0000 mg | INTRAMUSCULAR | Status: AC
  Administered 2019-03-07: 4 mg via INTRAVENOUS
  Filled 2019-03-07: qty 2

## 2019-03-07 MED ORDER — CITALOPRAM HYDROBROMIDE 20 MG PO TABS
10.0000 mg | ORAL_TABLET | Freq: Every day | ORAL | Status: DC
  Administered 2019-03-07 – 2019-03-08 (×2): 10 mg via ORAL
  Filled 2019-03-07 (×2): qty 1

## 2019-03-07 MED ORDER — FAMOTIDINE 20 MG PO TABS
20.0000 mg | ORAL_TABLET | Freq: Every day | ORAL | Status: DC
  Administered 2019-03-07 – 2019-03-08 (×2): 20 mg via ORAL
  Filled 2019-03-07 (×2): qty 1

## 2019-03-07 MED ORDER — ACETAMINOPHEN 325 MG PO TABS
650.0000 mg | ORAL_TABLET | Freq: Four times a day (QID) | ORAL | Status: DC | PRN
  Administered 2019-03-08 (×2): 650 mg via ORAL
  Filled 2019-03-07 (×2): qty 2

## 2019-03-07 MED ORDER — IOHEXOL 350 MG/ML SOLN
75.0000 mL | Freq: Once | INTRAVENOUS | Status: AC | PRN
  Administered 2019-03-07: 75 mL via INTRAVENOUS

## 2019-03-07 NOTE — ED Triage Notes (Signed)
Per EMS patient was tachycardic this morning with a heartrate of 170BPM and a fever of over 100F. She has taken 1000mg  acetaminophen at home. EMS administered 6mg  adenosine, 5mg  metoprolol, and 512ml of normal saline. Patient has a history of AFIB.

## 2019-03-07 NOTE — ED Notes (Signed)
Brief placed on patient and pad under patient. Patient c/o nausea

## 2019-03-07 NOTE — H&P (Addendum)
[facility] [place] NAME: [name]    MR#:  [id number]  DATE OF BIRTH:  1950-11-08  DATE OF ADMISSION:  03/07/2019  PRIMARY CARE PHYSICIAN: Leeroy Cha, MD   REQUESTING/REFERRING PHYSICIAN: Hinda Kehr, MD  CHIEF COMPLAINT:   Chief Complaint  Patient presents with  . Tachycardia    HISTORY OF PRESENT ILLNESS:  [name]  is a 68 y.o. Caucasian female with a known history of multiple medical problems that will be mentioned below, presented to the emergency room with the onset of nausea without vomiting as well as fever of 101.2 since about 2 PM yesterday, with associated dry heaves and epigastric abdominal pain with substernal chest pressure graded 7/10 in severity with no radiation however with diaphoresis.  Noted worsening dyspnea or cough or wheezing.  No hemoptysis.  She admitted to diarrhea with watery bowel movement.  There was no melena or bright red bleeding per rectum.  The patient was noted to have a tachycardic rhythm likely SVT for which she was given 6 mg of IV adenosine followed by 5 mg of IV Lopressor by EMS and by the time she came to the ER her EKG showed normal sinus rhythm with a rate of 85 with right axis deviation. Vital signs were within normal.Labs were remarkable for hypokalemia with potassium of 2.9 and magnesium was 1.8 with lactic acid of 1 high-sensitivity troponin I of 21 and CBC showed leukocytosis of 15 with neutrophilia.  Chest CTA came back negative for PE  The patient was given 2 g of IV magnesium sulfate 4 mg of IV Zofran and 40 mEq p.o. potassium chloride.  She will be admitted to medical monitored bed for further evaluation and management.   PAST MEDICAL HISTORY:   Past Medical History:  Diagnosis Date  . Bradycardia   . Chest pain   . Constipation   . Depression   . Diverticulitis   . Diverticulosis of colon   . DOE (dyspnea on exertion)   . DVT (deep venous thrombosis) ([place])    . Esophageal stricture   . Hyperlipemia   . Hypertension   . IBS (irritable bowel syndrome)   . Sleep apnea     PAST SURGICAL HISTORY:   Past Surgical History:  Procedure Laterality Date  . APPENDECTOMY    . BLOOD CLOT  2004   RIGHT LEG  . CHOLECYSTECTOMY    . COLON SURGERY    . GALLBLADDER SURGERY  2003  . NISSEN FUNDOPLICATION    . PLANTAR FASCIA SURGERY  2007  . sigmoid colectomy  10/21/2010   pelvic anastomosis  . VAGINAL HYSTERECTOMY      SOCIAL HISTORY:   Social History   Tobacco Use  . Smoking status: Never Smoker  . Smokeless tobacco: Never Used  Substance Use Topics  . Alcohol use: No    FAMILY HISTORY:   Family History  Problem Relation Age of Onset  . Heart disease Mother   . Crohn's disease Sister   . Colon polyps Sister   . Irritable bowel syndrome Brother   . Brain cancer Father   . Colitis Sister   . Diabetes Sister   . Breast cancer Cousin   . Colon cancer Neg Hx     DRUG ALLERGIES:   Allergies  Allergen Reactions  . Dicyclomine Other (See Comments)    REACTION: Choking  . Lisinopril Other (See Comments)    REACTION: Choking  . Losartan Shortness Of Breath  .  Ace Inhibitors Other (See Comments)    Unknown to patient  . Penicillins Itching and Rash    REVIEW OF SYSTEMS:   ROS As per history of present illness. All pertinent systems were reviewed above. Constitutional,  HEENT, cardiovascular, respiratory, GI, GU, musculoskeletal, neuro, psychiatric, endocrine,  integumentary and hematologic systems were reviewed and are otherwise  negative/unremarkable except for positive findings mentioned above in the HPI.   MEDICATIONS AT HOME:   Prior to Admission medications   Medication Sig Start Date End Date Taking? Authorizing Provider  albuterol (PROVENTIL HFA;VENTOLIN HFA) 108 (90 Base) MCG/ACT inhaler Inhale 2 puffs into the lungs every 6 (six) hours as needed for wheezing or shortness of breath. 11/16/17  Yes Wieting, Richard, MD   aspirin 81 MG tablet Take 1 tablet (81 mg total) by mouth daily. 10/27/17  Yes Duke, Tami Lin, PA  atorvastatin (LIPITOR) 20 MG tablet Take 20 mg by mouth daily.   Yes [provider]  Cholecalciferol (VITAMIN D-3 PO) Take 1 capsule by mouth daily.   Yes [provider]  citalopram (CELEXA) 10 MG tablet Take 1 tablet by mouth at bedtime. 10/20/17  Yes [provider]  famotidine (PEPCID) 20 MG tablet Take 20 mg by mouth at bedtime.  03/06/19  Yes [provider]  fluticasone (FLOVENT HFA) 220 MCG/ACT inhaler Inhale 2 puffs into the lungs 2 (two) times daily. Can substitute any steroid inhaler best covered by insurance 11/16/17  Yes Wieting, Richard, MD  hydrALAZINE (APRESOLINE) 10 MG tablet Take 1 tablet (10 mg total) by mouth 3 (three) times daily. 11/25/17 03/07/19 Yes Hilty, Nadean Corwin, MD  hydrochlorothiazide (HYDRODIURIL) 25 MG tablet Take 1 tablet (25 mg total) by mouth daily. 03/08/15  Yes Hilty, Nadean Corwin, MD  ketoconazole (NIZORAL) 2 % shampoo APPLY TO THE SCALP ONCE DAILY 01/27/18  Yes [provider]  metoprolol succinate (TOPROL-XL) 25 MG 24 hr tablet TAKE 1 TABLET BY MOUTH TWICE DAILY FOR 90 DAYS 02/15/18  Yes [provider]  omeprazole (PRILOSEC) 20 MG capsule Take 20 mg by mouth daily.   Yes [provider]      VITAL SIGNS:  Blood pressure (!) 134/55, pulse 80, temperature 100.1 F (37.8 C), resp. rate 20, height 5\' 1"  (1.549 m), weight 70.3 kg, SpO2 98 %.  PHYSICAL EXAMINATION:  Physical Exam  GENERAL:  68 y.o.-year-old Caucasian female patient lying in the bed with no acute distress.  EYES: Pupils equal, round, reactive to light and accommodation. No scleral icterus. Extraocular muscles intact.  HEENT: Head atraumatic, normocephalic. Oropharynx and nasopharynx clear.  NECK:  Supple, no jugular venous distention. No thyroid enlargement, no tenderness.  LUNGS: Normal breath sounds bilaterally, no wheezing,  rales,rhonchi or crepitation. No use of accessory muscles of respiration.  CARDIOVASCULAR: Regular rate and rhythm, S1, S2 normal. No murmurs, rubs, or gallops.  ABDOMEN: Soft with mild epigastric tenderness without rebound tenderness guarding or rigidity.  Bowel sounds present. No organomegaly or mass.  EXTREMITIES: No pedal edema, cyanosis, or clubbing.  NEUROLOGIC: Cranial nerves II through XII are intact. Muscle strength 5/5 in all extremities. Sensation intact. Gait not checked.  PSYCHIATRIC: The patient is alert and oriented x 3.  Normal affect and good eye contact. SKIN: No obvious rash, lesion, or ulcer.   LABORATORY PANEL:   CBC Recent Labs  Lab 03/07/19 0112  WBC 15.0*  HGB 12.3  HCT 35.8*  PLT 230   ------------------------------------------------------------------------------------------------------------------  Chemistries  Recent Labs  Lab 03/07/19 0112  NA  133*  K 2.9*  CL 96*  CO2 26  GLUCOSE 113*  BUN 11  CREATININE 0.73  CALCIUM 8.8*  MG 1.8  AST 47*  ALT 38  ALKPHOS 95  BILITOT 1.0   ------------------------------------------------------------------------------------------------------------------  Cardiac Enzymes No results for input(s): TROPONINI in the last 168 hours. ------------------------------------------------------------------------------------------------------------------  RADIOLOGY:  Ct Angio Chest Pe W/cm &/or Wo Cm  Result Date: 03/07/2019 CLINICAL DATA:  Fever and tachycardia. History of atrial fibrillation. EXAM: CT ANGIOGRAPHY CHEST WITH CONTRAST TECHNIQUE: Multidetector CT imaging of the chest was performed using the standard protocol during bolus administration of intravenous contrast. Multiplanar CT image reconstructions and MIPs were obtained to evaluate the vascular anatomy. CONTRAST:  48mL OMNIPAQUE IOHEXOL 350 MG/ML SOLN COMPARISON:  Chest x-ray 03/07/2019 FINDINGS: Cardiovascular: The heart is normal in size. No pericardial  effusion. The aorta is normal in caliber. Scattered atherosclerotic calcifications are noted at the aortic arch and branch vessel ostia. No aneurysm or dissection. No definite coronary artery calcifications. The pulmonary arterial tree is fairly well opacified. No filling defects to suggest pulmonary embolism. The pulmonary arteries are normal in caliber. Mediastinum/Nodes: No mediastinal or hilar mass or adenopathy. Small scattered lymph nodes are noted. Mild distal esophageal wall thickening and small hiatal hernia. Lungs/Pleura: No acute pulmonary findings. No infiltrates, edema or effusions. Minimal dependent subpleural atelectasis. No worrisome pulmonary lesions. No interstitial lung disease or bronchiectasis. Upper Abdomen: No significant upper abdominal findings. The gallbladder is surgically absent. Ostial calcifications noted at the celiac axis origin. Musculoskeletal: No breast masses, supraclavicular or axillary adenopathy. Thyroid gland is grossly normal. The bony thorax is intact. Review of the MIP images confirms the above findings. IMPRESSION: 1. No CT findings for pulmonary embolism. 2. No aortic aneurysm or dissection. 3. No acute pulmonary findings or worrisome pulmonary lesions. Aortic Atherosclerosis (ICD10-I70.0). Electronically Signed   By: Marijo Sanes M.D.   On: 03/07/2019 05:04   Dg Chest Portable 1 View  Result Date: 03/07/2019 CLINICAL DATA:  Fever and palpitations EXAM: PORTABLE CHEST 1 VIEW COMPARISON:  11/15/2017 FINDINGS: Cardiac shadow is within normal limits. Aortic calcifications are seen and stable. The lungs are clear bilaterally. No bony abnormality is noted. IMPRESSION: No active disease. Electronically Signed   By: Inez Catalina M.D.   On: 03/07/2019 02:02      IMPRESSION AND PLAN:   1.  Acute gastroenteritis with associated fever, probably viral in etiology.  The patient will be admitted to the medical monitored bed.  She will be placed on hydration with IV normal  saline.  We will obtain stool studies.  The patient will be placed on as needed antiemetics and IV Protonix.  2.  Hypokalemia.  Potassium will be replaced and magnesium level will be optimized.  3.  Likely SVT and less likely paroxysmal atrial fibrillation with RVR, status post conversion with 6 mg of IV adenosine and 5 mg IV Lopressor.  We will continue monitoring her and obtain a 2D echo.  Will optimize electrolytes.  Hypokalemia could be directly related to her arrhythmias.  4.  Chest pain.  This likely secondary to her acute gastritis and tachy- arrhythmia.  We will cycle cardiac enzymes for now.  5.  Hypertension.  We will continue Toprol-XL and hydralazine  6.  Dyslipidemia.  We will continue statin therapy.  7.  DVT prophylaxis.  Subtenons Lovenox.  All the records are reviewed and case discussed with ED provider. The plan of care was discussed in details with the patient (and family). I  answered all questions. The patient agreed to proceed with the above mentioned plan. Further management will depend upon hospital course.   CODE STATUS: Full code  TOTAL TIME TAKING CARE OF THIS PATIENT: 55 minutes.    Christel Mormon M.D on 03/07/2019 at 5:17 AM  Pager - [phone]  After 6pm go to [email or web address] - Proofreader  [facility]  Office  [phone]  CC: Primary care physician; Leeroy Cha, MD   Note: This dictation was prepared with Dragon dictation along with smaller phrase technology. Any transcriptional errors that result from this process are unintentional.

## 2019-03-07 NOTE — ED Notes (Signed)
Report received, care of pt assumed, pt resting quietly on ER stretcher, eyes closed, resp even and nonlabored, skin warm and dry.  NAD noted at this time, VSS, call bell in reach, will continue to monitor.

## 2019-03-07 NOTE — ED Provider Notes (Signed)
[facility] Emergency Department Provider Note  ____________________________________________   First MD Initiated Contact with Patient 03/07/19 0120     (approximate)  I have reviewed the triage vital signs and the nursing notes.   HISTORY  Chief Complaint Tachycardia    HPI [name] is a 68 y.o. female with extensive chronic medical issues as listed below who presents for evaluation of fever, rapid heart rate, chest pressure, nausea, and general malaise.  She says that the symptoms started acutely at around noon today and have gradually gotten worse.  She feels like she is going to pass out whenever she stands up and moves around.  She has had fever, both subjective and objective with a T-max of 101.6, periodically throughout the day.  She feels a little bit better with antipyretics but always gets worse again.  She has had a fundoplication and thus she states that she cannot vomit but she has had severe nausea and decreased oral intake.  Earlier this evening before coming to the ED she felt acute onset chest pressure, diaphoresis, and palpitations.  She has a history of A. fib and called EMS.  They found her with a heart rate of about 170.  They gave her adenosine 6 mg IV and metoprolol 5 mg IV and she was in a normal sinus rhythm when she arrived to the emergency department.  She says that she feels better with no chest pressure and no palpitations but she still feels weak and severely nauseated.  She denies sore throat, cough, shortness of breath, abdominal pain, and dysuria.  She has not been in contact with COVID-19 patients and she reports that she had an outpatient COVID-19 test a month ago that was negative.  She reports that overall her symptoms are severe.         Past Medical History:  Diagnosis Date  . Bradycardia   . Chest pain   . Constipation   . Depression   . Diverticulitis   . Diverticulosis of colon   . DOE (dyspnea on exertion)    . DVT (deep venous thrombosis) ([place])   . Esophageal stricture   . Hyperlipemia   . Hypertension   . IBS (irritable bowel syndrome)   . Sleep apnea     Patient Active Problem List   Diagnosis Date Noted  . Acute respiratory distress 11/16/2017  . Acute respiratory failure ([place]) 11/16/2017  . Hypertrophic cardiomyopathy ([place]) 07/28/2016  . Screening for lipid disorders 07/28/2016  . DOE (dyspnea on exertion) 07/02/2016  . Bradycardia 07/02/2016  . Anxiety 07/02/2016  . Anxiety state   . Atypical chest pain 03/08/2015  . Fever 08/12/2010  . ABDOMINAL PAIN, LEFT LOWER QUADRANT 06/23/2010  . DIVERTICULITIS, ACUTE 02/25/2010  . CONSTIPATION 01/22/2010  . IRRITABLE BOWEL SYNDROME 01/22/2010  . Essential hypertension 01/21/2010  . DEPRESSION 01/21/2010  . HYPERTENSION, CONTROLLED 01/21/2010  . DEEP VENOUS THROMBOPHLEBITIS 01/21/2010  . ESOPHAGEAL STRICTURE 02/29/2004  . Diverticulosis of colon 02/29/2004    Past Surgical History:  Procedure Laterality Date  . APPENDECTOMY    . BLOOD CLOT  2004   RIGHT LEG  . CHOLECYSTECTOMY    . COLON SURGERY    . GALLBLADDER SURGERY  2003  . NISSEN FUNDOPLICATION    . PLANTAR FASCIA SURGERY  2007  . sigmoid colectomy  10/21/2010   pelvic anastomosis  . VAGINAL HYSTERECTOMY      Prior to Admission medications   Medication Sig Start Date End Date Taking? Authorizing  Provider  albuterol (PROVENTIL HFA;VENTOLIN HFA) 108 (90 Base) MCG/ACT inhaler Inhale 2 puffs into the lungs every 6 (six) hours as needed for wheezing or shortness of breath. 11/16/17  Yes Wieting, Richard, MD  aspirin 81 MG tablet Take 1 tablet (81 mg total) by mouth daily. 10/27/17  Yes Duke, Tami Lin, PA  atorvastatin (LIPITOR) 20 MG tablet Take 20 mg by mouth daily.   Yes [provider]  Cholecalciferol (VITAMIN D-3 PO) Take 1 capsule by mouth daily.   Yes [provider]  citalopram (CELEXA) 10 MG tablet Take 1 tablet by mouth at bedtime. 10/20/17   Yes [provider]  famotidine (PEPCID) 20 MG tablet Take 20 mg by mouth at bedtime.  03/06/19  Yes [provider]  fluticasone (FLOVENT HFA) 220 MCG/ACT inhaler Inhale 2 puffs into the lungs 2 (two) times daily. Can substitute any steroid inhaler best covered by insurance 11/16/17  Yes Wieting, Richard, MD  hydrALAZINE (APRESOLINE) 10 MG tablet Take 1 tablet (10 mg total) by mouth 3 (three) times daily. 11/25/17 03/07/19 Yes Hilty, Nadean Corwin, MD  hydrochlorothiazide (HYDRODIURIL) 25 MG tablet Take 1 tablet (25 mg total) by mouth daily. 03/08/15  Yes Hilty, Nadean Corwin, MD  ketoconazole (NIZORAL) 2 % shampoo APPLY TO THE SCALP ONCE DAILY 01/27/18  Yes [provider]  metoprolol succinate (TOPROL-XL) 25 MG 24 hr tablet TAKE 1 TABLET BY MOUTH TWICE DAILY FOR 90 DAYS 02/15/18  Yes [provider]  omeprazole (PRILOSEC) 20 MG capsule Take 20 mg by mouth daily.   Yes [provider]    Allergies Dicyclomine, Lisinopril, Losartan, Ace inhibitors, and Penicillins  Family History  Problem Relation Age of Onset  . Heart disease Mother   . Crohn's disease Sister   . Colon polyps Sister   . Irritable bowel syndrome Brother   . Brain cancer Father   . Colitis Sister   . Diabetes Sister   . Breast cancer Cousin   . Colon cancer Neg Hx     Social History Social History   Tobacco Use  . Smoking status: Never Smoker  . Smokeless tobacco: Never Used  Substance Use Topics  . Alcohol use: No  . Drug use: No    Review of Systems Constitutional: +fever with T-max of 101.6.  General malaise and fatigue. Eyes: No visual changes. ENT: No sore throat. Cardiovascular: +chest pain.  Also palpitations and diaphoresis. Respiratory: Denies shortness of breath. Gastrointestinal: Severe nausea, no vomiting (she reports that she is not structurally able to vomit), no abdominal pain. Genitourinary: Negative for dysuria. Musculoskeletal: Negative for neck pain.   Negative for back pain. Integumentary: Negative for rash. Neurological: Generalized weakness but without focal numbness nor weakness.   ____________________________________________   PHYSICAL EXAM:  VITAL SIGNS: ED Triage Vitals  Enc Vitals Group     BP 03/07/19 0115 (!) 129/52     Pulse Rate 03/07/19 0115 81     Resp 03/07/19 0115 20     Temp 03/07/19 0115 100.1 F (37.8 C)     Temp src --      SpO2 03/07/19 0115 98 %     Weight 03/07/19 0116 70.3 kg (155 lb)     Height 03/07/19 0116 1.549 m (5\' 1" )     Head Circumference --      Peak Flow --      Pain Score 03/07/19 0115 8     Pain Loc --      Pain Edu? --  Excl. in [place]? --     Constitutional: Alert and oriented.  Appears uncomfortable and ill but nontoxic. Eyes: Conjunctivae are normal.  Head: Atraumatic. Nose: No congestion/rhinnorhea. Mouth/Throat: Patient is wearing a mask. Neck: No stridor.  No meningeal signs.   Cardiovascular: Normal rate, regular rhythm. Good peripheral circulation. Grossly normal heart sounds. Respiratory: Normal respiratory effort.  No retractions. Gastrointestinal: Soft and nontender. No distention.  Musculoskeletal: No lower extremity tenderness nor edema. No gross deformities of extremities. Neurologic:  Normal speech and language. No gross focal neurologic deficits are appreciated.  Skin:  Skin is warm, dry and intact. Psychiatric: Mood and affect are normal. Speech and behavior are normal.  ____________________________________________   LABS (all labs ordered are listed, but only abnormal results are displayed)  Labs Reviewed  CBC WITH DIFFERENTIAL/PLATELET - Abnormal; Notable for the following components:      Result Value   WBC 15.0 (*)    HCT 35.8 (*)    Neutro Abs 13.2 (*)    All other components within normal limits  COMPREHENSIVE METABOLIC PANEL - Abnormal; Notable for the following components:   Sodium 133 (*)    Potassium 2.9 (*)    Chloride 96 (*)    Glucose,  Bld 113 (*)    Calcium 8.8 (*)    AST 47 (*)    All other components within normal limits  CULTURE, BLOOD (ROUTINE X 2)  CULTURE, BLOOD (ROUTINE X 2)  SARS CORONAVIRUS 2 (TAT 6-24 HRS)  LIPASE, BLOOD  MAGNESIUM  LACTIC ACID, PLASMA  PROCALCITONIN  URINALYSIS, ROUTINE W REFLEX MICROSCOPIC  LACTIC ACID, PLASMA  TROPONIN I (HIGH SENSITIVITY)   ____________________________________________  EKG  ED ECG REPORT I, Hinda Kehr, the attending physician, personally viewed and interpreted this ECG.  Date: 03/07/2019 EKG Time: 1:12 AM Rate: 85 Rhythm: normal sinus rhythm QRS Axis: Borderline right axis deviation Intervals: normal ST/T Wave abnormalities: normal Narrative Interpretation: no evidence of acute ischemia  ____________________________________________  RADIOLOGY I, Hinda Kehr, personally viewed and evaluated these images (plain radiographs) as part of my medical decision making, as well as reviewing the written report by the radiologist.  ED MD interpretation: No acute abnormalities identified on chest x-ray.  No indication of PE or other acute abnormality on CTA chest.  Official radiology report(s): Ct Angio Chest Pe W/cm &/or Wo Cm  Result Date: 03/07/2019 CLINICAL DATA:  Fever and tachycardia. History of atrial fibrillation. EXAM: CT ANGIOGRAPHY CHEST WITH CONTRAST TECHNIQUE: Multidetector CT imaging of the chest was performed using the standard protocol during bolus administration of intravenous contrast. Multiplanar CT image reconstructions and MIPs were obtained to evaluate the vascular anatomy. CONTRAST:  59mL OMNIPAQUE IOHEXOL 350 MG/ML SOLN COMPARISON:  Chest x-ray 03/07/2019 FINDINGS: Cardiovascular: The heart is normal in size. No pericardial effusion. The aorta is normal in caliber. Scattered atherosclerotic calcifications are noted at the aortic arch and branch vessel ostia. No aneurysm or dissection. No definite coronary artery calcifications. The pulmonary  arterial tree is fairly well opacified. No filling defects to suggest pulmonary embolism. The pulmonary arteries are normal in caliber. Mediastinum/Nodes: No mediastinal or hilar mass or adenopathy. Small scattered lymph nodes are noted. Mild distal esophageal wall thickening and small hiatal hernia. Lungs/Pleura: No acute pulmonary findings. No infiltrates, edema or effusions. Minimal dependent subpleural atelectasis. No worrisome pulmonary lesions. No interstitial lung disease or bronchiectasis. Upper Abdomen: No significant upper abdominal findings. The gallbladder is surgically absent. Ostial calcifications noted at the celiac axis origin. Musculoskeletal: No breast masses, supraclavicular  or axillary adenopathy. Thyroid gland is grossly normal. The bony thorax is intact. Review of the MIP images confirms the above findings. IMPRESSION: 1. No CT findings for pulmonary embolism. 2. No aortic aneurysm or dissection. 3. No acute pulmonary findings or worrisome pulmonary lesions. Aortic Atherosclerosis (ICD10-I70.0). Electronically Signed   By: Marijo Sanes M.D.   On: 03/07/2019 05:04   Dg Chest Portable 1 View  Result Date: 03/07/2019 CLINICAL DATA:  Fever and palpitations EXAM: PORTABLE CHEST 1 VIEW COMPARISON:  11/15/2017 FINDINGS: Cardiac shadow is within normal limits. Aortic calcifications are seen and stable. The lungs are clear bilaterally. No bony abnormality is noted. IMPRESSION: No active disease. Electronically Signed   By: Inez Catalina M.D.   On: 03/07/2019 02:02    ____________________________________________   PROCEDURES   Procedure(s) performed (including Critical Care):  Procedures   ____________________________________________   INITIAL IMPRESSION / MDM / [place] / ED COURSE  As part of my medical decision making, I reviewed the following data within the [place] notes reviewed and incorporated, Labs reviewed , EKG interpreted , Old  chart reviewed, Discussed with admitting physician  and Notes from prior ED visits and reviewed radiograph of the chest   Differential diagnosis includes, but is not limited to, ACS, COVID-19, pneumonia, electrolyte or metabolic derangement, viral GI illness, A. fib, SVT.  The patient was either in A. fib with RVR or SVT prior to arrival.  It is a little bit difficult to appreciate on the rhythm strip although it seems most likely that she was in A. fib with RVR and corrected after metoprolol.  She is in a sinus rhythm and with a normal rate upon her arrival in the emergency department.  She received 500 mL normal saline prior to arrival.  However she still feels very weak and unable to get up even to get to the bathroom.  She has severe nausea for which I gave her Zofran 4 mg IV.  I will evaluate broadly with a septic work-up but she does not currently meet sepsis criteria.  Lab work is pending, although only her CBC is back currently and she has a leukocytosis of 15.  This is less likely to be the result of COVID-19 so a chest x-ray is pending and urinalysis is pending.       Clinical Course as of Mar 06 745  Tue Mar 07, 2019  0237 Significantly decreased potassium, I have ordered potassium 40 mEq by mouth repletion.  Of note the magnesium level is normal but I am going to give 2 g magnesium IV which may help with the absorption of the potassium by mouth.  Lactic acid is normal, procalcitonin is still pending.  White blood cell count is elevated at 15.  We are still awaiting urinalysis.  Potassium(!): 2.9 [CF]  0237 Chest x-ray is clear.  DG Chest Portable 1 View [CF]  0326 Lactic Acid, Venous: 1.0 [CF]  0327 Procalcitonin: <0.10 [CF]  0409 Normal UA except for mild ketones  Urinalysis, Routine w reflex microscopic(!) [CF]  0423 Contacted hospitalist team (Dr. Sidney Ace and Rufina Falco) for admission.  Troponin is elevated and given constellation of symptoms, patient needs hospital observation,  cardiology consult, IV fluids and antiemetics, potassium repletion.  Does not meet criteria for COVID-19 rapid test, but normal turnaround swab has been sent.   [CF]  [phone] Hospitalist team acknowledged admission.   [CF]  [phone] Discussed case further with Dr. Sidney Ace with the hospitalist service.  He is requesting a CTA chest to rule out pulmonary embolism.  I explained that I think that there is a low probability based on a wells score of 0, no hypoxemia, no tachycardia after the resolution of the initial arrhythmia, etc.  However, given the patient's comorbidities and a reported history of atrial fibrillation, I will order the study and he will proceed with admission.   [CF]    Clinical Course User Index [CF] Hinda Kehr, MD     ____________________________________________  FINAL CLINICAL IMPRESSION(S) / ED DIAGNOSES  Final diagnoses:  Fever, unspecified fever cause  Chest pain, unspecified type  Palpitations  Atrial fibrillation with RVR (HCC)  Generalized weakness  Nausea  SIRS (systemic inflammatory response syndrome) (HCC)  Hypokalemia     MEDICATIONS GIVEN DURING THIS VISIT:  Medications  magnesium sulfate IVPB 2 g 50 mL (2 g Intravenous New Bag/Given 03/07/19 0258)  potassium chloride SA (KLOR-CON) CR tablet 40 mEq (40 mEq Oral Given 03/07/19 0252)  ondansetron (ZOFRAN) injection 4 mg (4 mg Intravenous Given 03/07/19 0302)     ED Discharge Orders    None      *Please note:  [name] was evaluated in Emergency Department on 03/07/2019 for the symptoms described in the history of present illness. She was evaluated in the context of the global COVID-19 pandemic, which necessitated consideration that the patient might be at risk for infection with the SARS-CoV-2 virus that causes COVID-19. Institutional protocols and algorithms that pertain to the evaluation of patients at risk for COVID-19 are in a state of rapid change based on information released by regulatory  bodies including the CDC and federal and state organizations. These policies and algorithms were followed during the patient's care in the ED.  Some ED evaluations and interventions may be delayed as a result of limited staffing during the pandemic.*  Note:  This document was prepared using Dragon voice recognition software and may include unintentional dictation errors.   Hinda Kehr, MD 03/07/19 [phone]

## 2019-03-07 NOTE — ED Notes (Signed)
Lunch tray placed at pt bedside, pt continues to rest quietly with eyes closed, resp even and nonlabored, VSS.  Will continue to monitor.

## 2019-03-07 NOTE — ED Notes (Signed)
Pt ambulatory to BR with 1 assist.  Pt states she is feeling some better, but still feels very weak and has no appetite.  Pt given ginger ale per her request.  Provided crackers for encouragement of PO.  Will monitor.

## 2019-03-07 NOTE — Progress Notes (Signed)
Patient admitted early this morning.  Seen and examined by me later in the morning.  Please see H&P for further detail.  Patient states that she is feeling pretty miserable this morning.  She denies any abdominal pain, but endorses nausea and vomiting.  She states that the Phenergan has really helped her nausea.  On exam, she is a little bit sleepy, but is easily arousable.  She has a a regular rate and rhythm.  Lungs are clear to auscultation bilaterally.  Abdomen is soft, nontender, and nondistended.  Acute gastroenteritis- likely viral -Continue IV fluids and IV antiemetics -Will obtain stool studies if possible  Hypokalemia-likely due to vomiting -Replete and recheck  SVT-resolved s/p 6 mg IV adenosine and 5 mg IV Lopressor -CTA chest was negative for PE -Echo pending -Cardiac monitoring  Chest pain-likely due to acute gastritis.  Troponin is mildly elevated. -Will trend troponins  Hypertension -Continue home metoprolol and hydralazine  Hyperlipidemia -Continue home statin  Hyman Bible, MD

## 2019-03-07 NOTE — Progress Notes (Signed)
*  PRELIMINARY RESULTS* Echocardiogram 2D Echocardiogram has been performed.  [name] 03/07/2019, 2:26 PM

## 2019-03-07 NOTE — ED Notes (Signed)
Woke pt to give PO meds, but pt was not alert enough to take safely

## 2019-03-08 ENCOUNTER — Inpatient Hospital Stay: Payer: PPO

## 2019-03-08 ENCOUNTER — Encounter: Payer: Self-pay | Admitting: Radiology

## 2019-03-08 LAB — CBC
HCT: 34 % — ABNORMAL LOW (ref 36.0–46.0)
Hemoglobin: 11.8 g/dL — ABNORMAL LOW (ref 12.0–15.0)
MCH: 31.4 pg (ref 26.0–34.0)
MCHC: 34.7 g/dL (ref 30.0–36.0)
MCV: 90.4 fL (ref 80.0–100.0)
Platelets: 194 10*3/uL (ref 150–400)
RBC: 3.76 MIL/uL — ABNORMAL LOW (ref 3.87–5.11)
RDW: 14 % (ref 11.5–15.5)
WBC: 15.6 10*3/uL — ABNORMAL HIGH (ref 4.0–10.5)
nRBC: 0 % (ref 0.0–0.2)

## 2019-03-08 LAB — URINE CULTURE
Culture: NO GROWTH
Special Requests: NORMAL

## 2019-03-08 LAB — BASIC METABOLIC PANEL
Anion gap: 9 (ref 5–15)
BUN: 12 mg/dL (ref 8–23)
CO2: 24 mmol/L (ref 22–32)
Calcium: 8.4 mg/dL — ABNORMAL LOW (ref 8.9–10.3)
Chloride: 102 mmol/L (ref 98–111)
Creatinine, Ser: 0.82 mg/dL (ref 0.44–1.00)
GFR calc Af Amer: >60 mL/min (ref >60)
GFR calc non Af Amer: >60 mL/min (ref >60)
Glucose, Bld: 114 mg/dL — ABNORMAL HIGH (ref 70–99)
Potassium: 3.3 mmol/L — ABNORMAL LOW (ref 3.5–5.1)
Sodium: 135 mmol/L (ref 135–145)

## 2019-03-08 LAB — ECHOCARDIOGRAM COMPLETE
Height: 61 in
Weight: 2480 oz

## 2019-03-08 MED ORDER — IOHEXOL 300 MG/ML  SOLN
100.0000 mL | Freq: Once | INTRAMUSCULAR | Status: AC | PRN
  Administered 2019-03-08: 100 mL via INTRAVENOUS

## 2019-03-08 MED ORDER — LOPERAMIDE HCL 2 MG PO CAPS
2.0000 mg | ORAL_CAPSULE | ORAL | Status: DC | PRN
  Administered 2019-03-08 (×2): 2 mg via ORAL
  Filled 2019-03-08 (×2): qty 1

## 2019-03-08 MED ORDER — METRONIDAZOLE IN NACL 5-0.79 MG/ML-% IV SOLN
500.0000 mg | Freq: Three times a day (TID) | INTRAVENOUS | Status: DC
  Administered 2019-03-08 – 2019-03-09 (×4): 500 mg via INTRAVENOUS
  Filled 2019-03-08 (×7): qty 100

## 2019-03-08 MED ORDER — POTASSIUM CHLORIDE CRYS ER 20 MEQ PO TBCR
40.0000 meq | EXTENDED_RELEASE_TABLET | Freq: Once | ORAL | Status: AC
  Administered 2019-03-08: 40 meq via ORAL
  Filled 2019-03-08: qty 2

## 2019-03-08 MED ORDER — IOHEXOL 9 MG/ML PO SOLN: 500.0000 mL | ORAL | Status: AC

## 2019-03-08 MED ORDER — CIPROFLOXACIN IN D5W 400 MG/200ML IV SOLN
400.0000 mg | Freq: Two times a day (BID) | INTRAVENOUS | Status: DC
  Administered 2019-03-08 – 2019-03-09 (×3): 400 mg via INTRAVENOUS
  Filled 2019-03-08 (×5): qty 200

## 2019-03-08 NOTE — Evaluation (Signed)
Physical Therapy Evaluation Patient Details Name: [name] MRN: [id number] DOB: 1950/10/29 Today's Date: 03/08/2019   History of Present Illness  [name] is a 89yoF who comes [facility] on 10/27 after N/V and fever at home, pt admitted with enteritis. Pt also dehydrated upon admission with cardiac stress markers.  Clinical Impression  Pt admitted with above diagnosis. Pt currently with functional limitations due to the deficits listed below (see "PT Problem List"). Upon entry, pt in bed, awake and agreeable to participate. The pt is alert and oriented x4, pleasant, conversational, and generally a good historian. All mobility performed with modified independence. Pt reports some instability of gait when AMB with RN earlier in day to go to BR. Pt educated on untility of RW for improved independence and stability in gait. Pt educated on safe RW use for transfers and AMB. Pt tolerating 68ft AMB with RW reporting improved stability in gait and less falls anxiety. Functional mobility assessment demonstrates increased effort/time requirements, poor tolerance, but no need for physical assistance, whereas the patient performed these at a higher level of independence PTA. Pt also c/o occipital HA and neck stiffness similar PTA, has chronic difficulty with Left head turns which limit ability in driving. Recommend OPPT at DC to address these impairment and limitation. Pt will benefit from skilled PT intervention to increase independence and safety with basic mobility in preparation for discharge to the venue listed below.       Follow Up Recommendations Outpatient PT(general gait instability and chronic neck pain with mobility deficit)    Equipment Recommendations  Rolling walker with 5" wheels    Recommendations for Other Services       Precautions / Restrictions Precautions Precautions: Fall Restrictions Weight Bearing Restrictions: No      Mobility  Bed Mobility Overal bed mobility:  Modified Independent                Transfers Overall transfer level: Needs assistance Equipment used: Rolling walker (2 wheeled) Transfers: Sit to/from Stand Sit to Stand: Supervision            Ambulation/Gait Ambulation/Gait assistance: Min guard;Supervision Gait Distance (Feet): 60 Feet Assistive device: Rolling walker (2 wheeled) Gait Pattern/deviations: Step-to pattern        Stairs            Wheelchair Mobility    Modified Rankin (Stroke Patients Only)       Balance Overall balance assessment: Mild deficits observed, not formally tested;Modified Independent(steady and confident with RW)                                           Pertinent Vitals/Pain Pain Assessment: 0-10 Pain Location: 7/10 ABD cramps; 7/7 HA    Home Living Family/patient expects to be discharged to:: Private residence Living Arrangements: Alone Available Help at Discharge: Family(DTR Melissa adn [place] rRudy both lives <5 miles away) Type of Home: Mobile home Home Access: Stairs to enter Entrance Stairs-Rails: Left;Right;Can reach both Entrance Stairs-Number of Steps: 5((at baseline has no difficulty with steps)) Home Layout: One level Home Equipment: None      Prior Function Level of Independence: Independent         Comments: full yindependent at baseline in ADL/IADL, drives. Pt is retired. No falls history.     Hand Dominance   Dominant Hand: Right    Extremity/Trunk Assessment   Upper  Extremity Assessment Upper Extremity Assessment: Overall WFL for tasks assessed;Generalized weakness    Lower Extremity Assessment Lower Extremity Assessment: Overall WFL for tasks assessed;Generalized weakness       Communication   Communication: No difficulties  Cognition Arousal/Alertness: Awake/alert Behavior During Therapy: WFL for tasks assessed/performed Overall Cognitive Status: Within Functional Limits for tasks assessed                                         General Comments      Exercises     Assessment/Plan    PT Assessment Patient needs continued PT services  PT Problem List Decreased strength;Decreased activity tolerance;Decreased mobility;Decreased balance;Decreased knowledge of use of DME       PT Treatment Interventions DME instruction;Balance training;Gait training;Functional mobility training;Therapeutic activities    PT Goals (Current goals can be found in the Care Plan section)  Acute Rehab PT Goals Patient Stated Goal: regain balance and independence at home PT Goal Formulation: With patient Time For Goal Achievement: 03/23/19 Potential to Achieve Goals: Good    Frequency Min 2X/week   Barriers to discharge        Co-evaluation               AM-PAC PT "6 Clicks" Mobility  Outcome Measure Help needed turning from your back to your side while in a flat bed without using bedrails?: None Help needed moving from lying on your back to sitting on the side of a flat bed without using bedrails?: None Help needed moving to and from a bed to a chair (including a wheelchair)?: None Help needed standing up from a chair using your arms (e.g., wheelchair or bedside chair)?: A Little Help needed to walk in hospital room?: A Little Help needed climbing 3-5 steps with a railing? : A Little 6 Click Score: 21    End of Session   Activity Tolerance: Patient tolerated treatment well;Patient limited by fatigue Patient left: in bed;with call bell/phone within reach;with bed alarm set Nurse Communication: Mobility status PT Visit Diagnosis: Difficulty in walking, not elsewhere classified (R26.2);Unsteadiness on feet (R26.81);Muscle weakness (generalized) (M62.81)    Time: [id number] PT Time Calculation (min) (ACUTE ONLY): 23 min   Charges:   PT Evaluation $PT Eval Low Complexity: 1 Low PT Treatments $Gait Training: 8-22 mins        3:52 PM, 03/08/19 Etta Grandchild, PT,  DPT Physical Therapist - [facility]  [phone] ([place])   [place] C 03/08/2019, 3:49 PM

## 2019-03-08 NOTE — Plan of Care (Signed)
  Problem: Education: Goal: Knowledge of General Education information will improve Description: Including pain rating scale, medication(s)/side effects and non-pharmacologic comfort measures Outcome: Progressing   Problem: Activity: Goal: Risk for activity intolerance will decrease Outcome: Progressing Note: Patient up to the bathroom with one assist    Problem: Safety: Goal: Ability to remain free from injury will improve Outcome: Progressing   Problem: Elimination: Goal: Will not experience complications related to bowel motility Outcome: Not Progressing Note: Patient still having diarrhea, PRN medication started this evening.

## 2019-03-08 NOTE — Consult Note (Signed)
Pharmacy Antibiotic Note  [name] is a 68 y.o. female admitted on 03/07/2019 with Intra-abdominal infection.  Pharmacy has been consulted for ciprofloxacin dosing. Pt has a PCN allergy (rash/itchy)  Plan: Ciprofloxacin 400 mg IV BID.   Height: 5\' 1"  (154.9 cm) Weight: 157 lb 6.4 oz (71.4 kg) IBW/kg (Calculated) : 47.8  Temp (24hrs), Avg:100.4 F (38 C), Min:99 F (37.2 C), Max:101.7 F (38.7 C)  Recent Labs  Lab 03/07/19 0112 03/07/19 0150 03/07/19 0421 03/08/19 0503  WBC 15.0*  --   --  15.6*  CREATININE 0.73  --   --  0.82  LATICACIDVEN  --  1.0 1.0  --     Estimated Creatinine Clearance: 60.1 mL/min (by C-G formula based on SCr of 0.82 mg/dL).    Allergies  Allergen Reactions  . Dicyclomine Other (See Comments)    REACTION: Choking  . Lisinopril Other (See Comments)    REACTION: Choking  . Losartan Shortness Of Breath  . Ace Inhibitors Other (See Comments)    Unknown to patient  . Penicillins Itching and Rash    Antimicrobials this admission: 10/28 flagyl >>  10/28 cipro >>   Dose adjustments this admission: None  Microbiology results: 10/27 BCx: pending 10/27 UCx: pending   Thank you for allowing pharmacy to be a part of this patient's care.  Oswald Hillock, PharmD, BCPS 03/08/2019 9:42 AM

## 2019-03-08 NOTE — Progress Notes (Signed)
[facility] - [place] at [facility]   PATIENT NAME: [name]    MR#:  [id number]  DATE OF BIRTH:  Jul 03, 1950  SUBJECTIVE:   Feeling a little bit better today.  She is still a little nauseated and sick to her stomach.  She has not had any vomiting.  She did have a fever to 101.30F overnight.  She also endorses 4 episodes of diarrhea overnight.  She denies any chills.  No dysuria or cough.  REVIEW OF SYSTEMS:  Review of Systems  Constitutional: Positive for fever. Negative for chills.  HENT: Negative for congestion and sore throat.   Eyes: Negative for blurred vision and double vision.  Respiratory: Negative for cough and shortness of breath.   Cardiovascular: Negative for chest pain and palpitations.  Gastrointestinal: Positive for abdominal pain, diarrhea and nausea. Negative for vomiting.  Genitourinary: Negative for dysuria and urgency.  Musculoskeletal: Negative for back pain and neck pain.  Neurological: Negative for dizziness and headaches.  Psychiatric/Behavioral: Negative for depression. The patient is not nervous/anxious.     DRUG ALLERGIES:   Allergies  Allergen Reactions  . Dicyclomine Other (See Comments)    REACTION: Choking  . Lisinopril Other (See Comments)    REACTION: Choking  . Losartan Shortness Of Breath  . Ace Inhibitors Other (See Comments)    Unknown to patient  . Penicillins Itching and Rash   VITALS:  Blood pressure 133/60, pulse 91, temperature (!) 100.5 F (38.1 C), temperature source Oral, resp. rate 20, height 5\' 1"  (1.549 m), weight 71.4 kg, SpO2 95 %. PHYSICAL EXAMINATION:  Physical Exam  GENERAL:  Laying in the bed with no acute distress.  HEENT: Head atraumatic, normocephalic. Pupils equal, round, reactive to light and accommodation. No scleral icterus. Extraocular muscles intact. Oropharynx and nasopharynx clear.  NECK:  Supple, no jugular venous distention. No thyroid enlargement. LUNGS: Lungs are clear to  auscultation bilaterally. No wheezes, crackles, rhonchi. No use of accessory muscles of respiration.  CARDIOVASCULAR: RRR, S1, S2 normal. No murmurs, rubs, or gallops.  ABDOMEN: Soft, nondistended. Bowel sounds present. + Mild LLQ tenderness to palpation, no rebound or guarding. EXTREMITIES: No pedal edema, cyanosis, or clubbing.  NEUROLOGIC: CN 2-12 intact, no focal deficits. 5/5 muscle strength throughout all extremities. Sensation intact throughout. Gait not checked.  PSYCHIATRIC: The patient is alert and oriented x 3.  SKIN: No obvious rash, lesion, or ulcer.  LABORATORY PANEL:  Female CBC Recent Labs  Lab 03/08/19 0503  WBC 15.6*  HGB 11.8*  HCT 34.0*  PLT 194   ------------------------------------------------------------------------------------------------------------------ Chemistries  Recent Labs  Lab 03/07/19 0112 03/08/19 0503  NA 133* 135  K 2.9* 3.3*  CL 96* 102  CO2 26 24  GLUCOSE 113* 114*  BUN 11 12  CREATININE 0.73 0.82  CALCIUM 8.8* 8.4*  MG 1.8  --   AST 47*  --   ALT 38  --   ALKPHOS 95  --   BILITOT 1.0  --    RADIOLOGY:  Dg Chest 1 View  Result Date: 03/08/2019 CLINICAL DATA:  Fever EXAM: CHEST  1 VIEW COMPARISON:  03/07/2019 FINDINGS: Heart and mediastinal contours are within normal limits. No focal opacities or effusions. No acute bony abnormality. IMPRESSION: No active disease. Electronically Signed   By: Charlett Nose M.D.   On: 03/08/2019 08:09   Ct Abdomen Pelvis W Contrast  Result Date: 03/08/2019 CLINICAL DATA:  Abdominal pain, fever EXAM: CT ABDOMEN AND PELVIS WITH CONTRAST TECHNIQUE: Multidetector CT  imaging of the abdomen and pelvis was performed using the standard protocol following bolus administration of intravenous contrast. CONTRAST:  OMNIPAQUE IOHEXOL 300 MG/ML  SOLN COMPARISON:  None. FINDINGS: Lower chest: Linear atelectasis or scarring in the lung bases. Heart is normal size. No acute abnormality. Hepatobiliary: No focal liver  abnormality is seen. Status post cholecystectomy. No biliary dilatation. Pancreas: No focal abnormality or ductal dilatation. Spleen: No focal abnormality.  Normal size. Adrenals/Urinary Tract: Horseshoe kidney. Left upper pole nonobstructing renal stones, the largest 4 mm. No stones on the right. No hydronephrosis. Urinary bladder unremarkable. Stomach/Bowel: Postoperative changes in the sigmoid colon. Stomach, large and small bowel grossly unremarkable. Vascular/Lymphatic: Aortic atherosclerosis. No enlarged abdominal or pelvic lymph nodes. Reproductive: Prior hysterectomy.  No adnexal masses. Other: No free fluid or free air. Musculoskeletal: No acute bony abnormality. IMPRESSION: Horseshoe kidney. Left upper pole renal stones. No hydronephrosis or ureteral stones. Aortic atherosclerosis. No acute findings in the abdomen or pelvis. Electronically Signed   By: Charlett Nose M.D.   On: 03/08/2019 11:31   ASSESSMENT AND PLAN:   Acute gastroenteritis- likely viral.  Patient did spike a fever to 101.45F overnight.  WBC remains elevated. -Start ciprofloxacin and Flagyl -Check CT abdomen/pelvis to assess for diverticulitis, abscess, etc. -Continue IV fluids and IV antiemetics -C. difficile was negative -GI pathogen panel is pending  Hypokalemia-likely due to vomiting -Replete and recheck  SVT-resolved s/p 6 mg IV adenosine and 5 mg IV Lopressor -CTA chest was negative for PE -ECHO was unremarkable -Cardiac monitoring  Chest pain-likely due to acute gastritis.  Troponin is mildly elevated. -Will trend troponins  Hypertension -Continue home metoprolol and hydralazine  Hyperlipidemia -Continue home statin  All the records are reviewed and case discussed with Care Management/Social Worker. Management plans discussed with the patient, family and they are in agreement.  CODE STATUS: Full Code  TOTAL TIME TAKING CARE OF THIS PATIENT: 40 minutes.   More than 50% of the time was spent in  counseling/coordination of care: YES  POSSIBLE D/C IN 1-2 DAYS, DEPENDING ON CLINICAL CONDITION.   Jinny Blossom Lashon Beringer M.D on 03/08/2019 at 1:18 PM  Between 7am to 6pm - Pager - [phone]  After 6pm go to [email or web address] - Social research officer, government  [facility] [place] [facility]  Office  [phone]  CC: Primary care physician; Lorenda Ishihara, MD  Note: This dictation was prepared with Dragon dictation along with smaller phrase technology. Any transcriptional errors that result from this process are unintentional.

## 2019-03-09 LAB — BASIC METABOLIC PANEL
Anion gap: 8 (ref 5–15)
BUN: 6 mg/dL — ABNORMAL LOW (ref 8–23)
CO2: 25 mmol/L (ref 22–32)
Calcium: 8 mg/dL — ABNORMAL LOW (ref 8.9–10.3)
Chloride: 102 mmol/L (ref 98–111)
Creatinine, Ser: 0.77 mg/dL (ref 0.44–1.00)
GFR calc Af Amer: >60 mL/min (ref >60)
GFR calc non Af Amer: >60 mL/min (ref >60)
Glucose, Bld: 97 mg/dL (ref 70–99)
Potassium: 2.6 mmol/L — CL (ref 3.5–5.1)
Sodium: 135 mmol/L (ref 135–145)

## 2019-03-09 LAB — CBC
HCT: 27.9 % — ABNORMAL LOW (ref 36.0–46.0)
Hemoglobin: 9.8 g/dL — ABNORMAL LOW (ref 12.0–15.0)
MCH: 31 pg (ref 26.0–34.0)
MCHC: 35.1 g/dL (ref 30.0–36.0)
MCV: 88.3 fL (ref 80.0–100.0)
Platelets: 156 10*3/uL (ref 150–400)
RBC: 3.16 MIL/uL — ABNORMAL LOW (ref 3.87–5.11)
RDW: 13.7 % (ref 11.5–15.5)
WBC: 8.4 10*3/uL (ref 4.0–10.5)
nRBC: 0 % (ref 0.0–0.2)

## 2019-03-09 LAB — POTASSIUM
Potassium: 2.9 mmol/L — ABNORMAL LOW (ref 3.5–5.1)
Potassium: 2.8 mmol/L — ABNORMAL LOW (ref 3.5–5.1)
Potassium: 3.5 mmol/L (ref 3.5–5.1)

## 2019-03-09 LAB — MAGNESIUM: Magnesium: 1.5 mg/dL — ABNORMAL LOW (ref 1.7–2.4)

## 2019-03-09 MED ORDER — CIPROFLOXACIN HCL 500 MG PO TABS: 500.0000 mg | ORAL_TABLET | Freq: Two times a day (BID) | ORAL | 0 refills | Status: AC

## 2019-03-09 MED ORDER — POTASSIUM CHLORIDE CRYS ER 20 MEQ PO TBCR
40.0000 meq | EXTENDED_RELEASE_TABLET | ORAL | Status: AC
  Administered 2019-03-09 (×2): 40 meq via ORAL
  Filled 2019-03-09 (×2): qty 2

## 2019-03-09 MED ORDER — POTASSIUM CHLORIDE 10 MEQ/100ML IV SOLN
10.0000 meq | Freq: Once | INTRAVENOUS | Status: AC
  Administered 2019-03-09: 10 meq via INTRAVENOUS
  Filled 2019-03-09: qty 100

## 2019-03-09 MED ORDER — LORAZEPAM 1 MG PO TABS: 1.0000 mg | ORAL_TABLET | Freq: Every evening | ORAL | 0 refills | Status: DC | PRN

## 2019-03-09 MED ORDER — MAGNESIUM SULFATE 2 GM/50ML IV SOLN
2.0000 g | Freq: Once | INTRAVENOUS | Status: AC
  Administered 2019-03-09: 2 g via INTRAVENOUS
  Filled 2019-03-09: qty 50

## 2019-03-09 MED ORDER — METRONIDAZOLE 500 MG PO TABS: 500.0000 mg | ORAL_TABLET | Freq: Three times a day (TID) | ORAL | 0 refills | Status: AC

## 2019-03-09 MED ORDER — POTASSIUM CHLORIDE CRYS ER 20 MEQ PO TBCR
40.0000 meq | EXTENDED_RELEASE_TABLET | Freq: Once | ORAL | Status: AC
  Administered 2019-03-09: 40 meq via ORAL
  Filled 2019-03-09: qty 2

## 2019-03-09 NOTE — Progress Notes (Signed)
Made dr. Brett Albino aware potassium is 2.9. lab was drawn prior to patient getting her second dose of kdur 58meq po.

## 2019-03-09 NOTE — Progress Notes (Signed)
Paged dr. Brett Albino to make aware patients potassium is 2.6. waiting for md to place orders as needed. Will continue to monitor

## 2019-03-09 NOTE — Discharge Instructions (Signed)
It was so nice to meet you during this hospitalization!  You came into the hospital with nausea and diarrhea. I think you have a bacterial infection of your intestines. We did a cat scan of your stomach, which did not show any diverticulitis.  I have prescribed the following medications: 1. Please take ciprofloxacin 500mg  twice a day, starting tonight and continuing for 5 more days- this is an antibiotic 2. Please take flagyl 500mg  three times a day, starting with two doses today and continuing for 5 more days- this is an antibiotic 3. I have also prescribed some ativan for you to use at night for sleep, since you felt like this really helped you while you were in the hospital.  Take care, Dr. Brett Albino

## 2019-03-09 NOTE — Progress Notes (Signed)
Nutrition Education Note  RD consulted for nutrition education per patient's request prior to discharge today.  Patient is currently on enteric precautions. Spoke with patient over the phone. She reports she is confused about what to eat as she has IBS, diverticulosis/hx of diverticulitis, Celiac Disease, and a hiatal hernia. She reports she tends to have diarrhea frequently. She does not follow a gluten-free diet closely because she is confused about what to do. She reports she has not had an active diverticulitis flare for a little while.  RD provided "Celiac Disease Nutrition Therapy" and "Celiac Disease Label Reading Tips" handouts from the Academy of Nutrition and Dietetics. Reviewed patient's dietary recall. Discussed Celiac disease and the impact of eating gluten for patients with Celiac. Encouraged patient to carefully read all food labels and ingredient lists to see if food contains wheat, barley, or rye. Also reviewed hidden sources of gluten and potential sources of cross-contamination patient should avoid. Encouraged patient to speak with physician or pharmacist regarding prescription and over the counter supplements and medications patient takes to ensure they do not contain ingredients made from wheat or barley.   RD also provided "Irritible Bowel Syndrome Nutrition Therapy" and "High Fiber Nutrition Therapy" handouts from the Academy of Nutrition and Dietetics. Encouraged patient to eat small, frequent meals (6 small meals) throughout the day. Discussed avoidance of foods she knows she does not tolerate such as spicy or greasy foods. Discussed monitoring symptoms and limiting intake of foods that exacerbate symptoms. May be difficult for patient to tell if foods are exacerbating IBS since she does not follow a strict gluten-free diet. Encouraged patient to slowly increase fiber intake over time to limit GI discomfort.   Teach back method used. All handouts were put in patient's discharge  packet with RD contact information on top.  Expect good compliance.  Body mass index is 29.91 kg/m. Pt meets criteria for overweight based on current BMI.  Current diet order is heart healthy, patient is consuming approximately 75% of meals at this time. Labs and medications reviewed. No further nutrition interventions warranted at this time. RD contact information provided. If additional nutrition issues arise, please re-consult RD.  Willey Blade, MS, [place], LDN Office: [phone] Pager: [phone] After Hours/Weekend Pager: [phone]

## 2019-03-09 NOTE — Progress Notes (Signed)
Dr. Brett Albino made aware current potassium is 3.5. per md patient is okay to be discharged.

## 2019-03-09 NOTE — Progress Notes (Addendum)
Gillie Manners to be D/C'd Home per MD order.  Discussed prescriptions and follow up appointments with the patient. Prescription given to patient, two prescriptions electronically submitted, medication list explained in detail. Pt verbalized understanding. Walker delivered to bedside. Home medication returned to patient  Allergies as of 03/09/2019      Reactions   Dicyclomine Other (See Comments)   REACTION: Choking   Lisinopril Other (See Comments)   REACTION: Choking   Losartan Shortness Of Breath   Ace Inhibitors Other (See Comments)   Unknown to patient   Penicillins Itching, Rash      Medication List    TAKE these medications   albuterol 108 (90 Base) MCG/ACT inhaler Commonly known as: VENTOLIN HFA Inhale 2 puffs into the lungs every 6 (six) hours as needed for wheezing or shortness of breath.   aspirin 81 MG tablet Take 1 tablet (81 mg total) by mouth daily.   atorvastatin 20 MG tablet Commonly known as: LIPITOR Take 20 mg by mouth daily.   ciprofloxacin 500 MG tablet Commonly known as: Cipro Take 1 tablet (500 mg total) by mouth 2 (two) times daily for 6 days.   citalopram 10 MG tablet Commonly known as: CELEXA Take 1 tablet by mouth at bedtime.   famotidine 20 MG tablet Commonly known as: PEPCID Take 20 mg by mouth at bedtime.   fluticasone 220 MCG/ACT inhaler Commonly known as: Flovent HFA Inhale 2 puffs into the lungs 2 (two) times daily. Can substitute any steroid inhaler best covered by insurance   hydrALAZINE 10 MG tablet Commonly known as: APRESOLINE Take 1 tablet (10 mg total) by mouth 3 (three) times daily.   hydrochlorothiazide 25 MG tablet Commonly known as: HYDRODIURIL Take 1 tablet (25 mg total) by mouth daily.   ketoconazole 2 % shampoo Commonly known as: NIZORAL APPLY TO THE SCALP ONCE DAILY   LORazepam 1 MG tablet Commonly known as: Ativan Take 1 tablet (1 mg total) by mouth at bedtime as needed for anxiety.   metoprolol succinate 25  MG 24 hr tablet Commonly known as: TOPROL-XL TAKE 1 TABLET BY MOUTH TWICE DAILY FOR 90 DAYS   metroNIDAZOLE 500 MG tablet Commonly known as: Flagyl Take 1 tablet (500 mg total) by mouth 3 (three) times daily for 6 days.   omeprazole 20 MG capsule Commonly known as: PRILOSEC Take 20 mg by mouth daily.   VITAMIN D-3 PO Take 1 capsule by mouth daily.            Durable Medical Equipment  (From admission, onward)         Start     Ordered   03/09/19 0746  For home use only DME Walker rolling  Once    Question:  Patient needs a walker to treat with the following condition  Answer:  Generalized weakness   03/09/19 0745          Vitals:   03/09/19 0753 03/09/19 1737  BP: (!) 148/72 (!) 153/88  Pulse: 75   Resp: 19   Temp: 98.6 F (37 C)   SpO2: 94%     Skin clean, dry and intact without evidence of skin break down, no evidence of skin tears noted. IV catheter discontinued intact. Site without signs and symptoms of complications. Dressing and pressure applied. Pt denies pain at this time. No complaints noted.  An After Visit Summary was printed and given to the patient. Nutrition information given to patient. Patient waiting on ride to be discharged home  [place]

## 2019-03-09 NOTE — Progress Notes (Signed)
PT Cancellation Note  Patient Details Name: [name] MRN: [id number] DOB: September 06, 1950   Cancelled Treatment:    Reason Eval/Treat Not Completed: Medical issues which prohibited therapy(Chart reviewed: K+ outside of safe range for PT this date (2.6), wil follow remotely and resume services once appropriate.)  11:36 AM, 03/09/19 Etta Grandchild, PT, DPT Physical Therapist - [facility]  [phone] ([place])    [place] C 03/09/2019, 11:36 AM

## 2019-03-09 NOTE — TOC Transition Note (Signed)
Transition of Care [facility]) - CM/SW Discharge Note   Patient Details  Name: MINTA FAIR MRN: [id number] Date of Birth: Sep 08, 1950  [facility] CM/SW Contact:  Candie Chroman, LCSW Phone Number: 03/09/2019, 12:29 PM   Clinical Narrative:  CSW met with patient, introduced role, and explained that PT recommendations would be discussed. Patient agreeable to outpatient PT. First preference is [facility]. MD signed order and CSW faxed to them. Patient agreeable to DME recommendation for a rolling walker. Sent secure email to [place] representative with her information. Patient's daughter will pick her up today. No further concerns. CSW signing off.   Final next level of care: Home/Self Care Barriers to Discharge: Barriers Resolved   Patient Goals and CMS Choice     Choice offered to / list presented to : NA  Discharge Placement                       Discharge Plan and Services     Post Acute Care Choice: Durable Medical Equipment(Outpatient PT)          DME Arranged: Walker rolling DME Agency: [employer] Date DME Agency Contacted: 03/09/19   Representative spoke with at DME Agency: [place] Arranged: NA          Social Determinants of Health (SDOH) Interventions     Readmission Risk Interventions No flowsheet data found.

## 2019-03-09 NOTE — Discharge Summary (Signed)
[facility] [place]   PATIENT NAME: [name]    MR#:  [id number]  DATE OF BIRTH:  1950-12-11  DATE OF ADMISSION:  03/07/2019   ADMITTING PHYSICIAN: Melissa Beat, MD  DATE OF DISCHARGE: 03/09/19  PRIMARY CARE PHYSICIAN: Melissa Ishihara, MD   ADMISSION DIAGNOSIS:  Palpitations [R00.2] Hypokalemia [E87.6] Nausea [R11.0] SIRS (systemic inflammatory response syndrome) (HCC) [R65.10] Generalized weakness [R53.1] Atrial fibrillation with RVR (HCC) [I48.91] Fever, unspecified fever cause [R50.9] Chest pain, unspecified type [R07.9] DISCHARGE DIAGNOSIS:  Active Problems:   SVT (supraventricular tachycardia) (HCC)  SECONDARY DIAGNOSIS:   Past Medical History:  Diagnosis Date  . Bradycardia   . Chest pain   . Constipation   . Depression   . Diverticulitis   . Diverticulosis of colon   . DOE (dyspnea on exertion)   . DVT (deep venous thrombosis) (HCC)   . Esophageal stricture   . Hyperlipemia   . Hypertension   . IBS (irritable bowel syndrome)   . Sleep apnea    HOSPITAL COURSE:   [name] is a 68 year old female who presented to the ED with nausea, diarrhea, generalized abdominal pain, and fever to 101F.  She was noted to be in SVT by EMS and was given 6 mg IV adenosine and 5 mg IV Lopressor.  On arrival to the ED, she was in normal sinus rhythm with a heart rate of 85.  She was admitted for further management.  Acute gastroenteritis-improving.  -Nausea, diarrhea, and abdominal pain had all greatly improved on the day of discharge -CT abdomen/pelvis was negative for acute abnormalities -Patient was treated with a 7-day course of ciprofloxacin and Flagyl -C. difficile was negative -GI pathogen panel was pending at the time of discharge  Hypokalemia-likely due to diarrhea -Improved with repletion -Recheck BMP as an outpatient  SVT-resolved s/p 6 mg IV adenosine and 5 mg IV Lopressor -CTA chest was negative for PE -ECHO  was unremarkable -Home metoprolol was continued  Hypertension -Continued home metoprolol and hydralazine  Hyperlipidemia -Continued home statin  DISCHARGE CONDITIONS:  Acute gastroenteritis Hypokalemia SVT Hypertension Hyperlipidemia CONSULTS OBTAINED:  None DRUG ALLERGIES:   Allergies  Allergen Reactions  . Dicyclomine Other (See Comments)    REACTION: Choking  . Lisinopril Other (See Comments)    REACTION: Choking  . Losartan Shortness Of Breath  . Ace Inhibitors Other (See Comments)    Melissa to patient  . Penicillins Itching and Rash   DISCHARGE MEDICATIONS:   Allergies as of 03/09/2019      Reactions   Dicyclomine Other (See Comments)   REACTION: Choking   Lisinopril Other (See Comments)   REACTION: Choking   Losartan Shortness Of Breath   Ace Inhibitors Other (See Comments)   Melissa to patient   Penicillins Itching, Rash      Medication List    TAKE these medications   albuterol 108 (90 Base) MCG/ACT inhaler Commonly known as: VENTOLIN HFA Inhale 2 puffs into the lungs every 6 (six) hours as needed for wheezing or shortness of breath.   aspirin 81 MG tablet Take 1 tablet (81 mg total) by mouth daily.   atorvastatin 20 MG tablet Commonly known as: LIPITOR Take 20 mg by mouth daily.   ciprofloxacin 500 MG tablet Commonly known as: Cipro Take 1 tablet (500 mg total) by mouth 2 (two) times daily for 6 days.   citalopram 10 MG tablet Commonly known as: CELEXA Take 1 tablet by mouth at bedtime.  famotidine 20 MG tablet Commonly known as: PEPCID Take 20 mg by mouth at bedtime.   fluticasone 220 MCG/ACT inhaler Commonly known as: Flovent HFA Inhale 2 puffs into the lungs 2 (two) times daily. Can substitute any steroid inhaler best covered by insurance   hydrALAZINE 10 MG tablet Commonly known as: APRESOLINE Take 1 tablet (10 mg total) by mouth 3 (three) times daily.   hydrochlorothiazide 25 MG tablet Commonly known as:  HYDRODIURIL Take 1 tablet (25 mg total) by mouth daily.   ketoconazole 2 % shampoo Commonly known as: NIZORAL APPLY TO THE SCALP ONCE DAILY   LORazepam 1 MG tablet Commonly known as: Ativan Take 1 tablet (1 mg total) by mouth at bedtime as needed for anxiety.   metoprolol succinate 25 MG 24 hr tablet Commonly known as: TOPROL-XL TAKE 1 TABLET BY MOUTH TWICE DAILY FOR 90 DAYS   metroNIDAZOLE 500 MG tablet Commonly known as: Flagyl Take 1 tablet (500 mg total) by mouth 3 (three) times daily for 6 days.   omeprazole 20 MG capsule Commonly known as: PRILOSEC Take 20 mg by mouth daily.   VITAMIN D-3 PO Take 1 capsule by mouth daily.            Durable Medical Equipment  (From admission, onward)         Start     Ordered   03/09/19 0746  For home use only DME Walker rolling  Once    Question:  Patient needs a walker to treat with the following condition  Answer:  Generalized weakness   03/09/19 0745           DISCHARGE INSTRUCTIONS:  1.  Follow-up with PCP in 5 days 2.  Take ciprofloxacin and Flagyl as prescribed for 7 days DIET:  Celiac diet DISCHARGE CONDITION:  Stable ACTIVITY:  Activity as tolerated OXYGEN:  Home Oxygen: No.  Oxygen Delivery: room air DISCHARGE LOCATION:  home   If you experience worsening of your admission symptoms, develop shortness of breath, life threatening emergency, suicidal or homicidal thoughts you must seek medical attention immediately by calling 911 or calling your MD immediately  if symptoms less severe.  You Must read complete instructions/literature along with all the possible adverse reactions/side effects for all the Medicines you take and that have been prescribed to you. Take any new Medicines after you have completely understood and accpet all the possible adverse reactions/side effects.   Please note  You were cared for by a hospitalist during your hospital stay. If you have any questions about your discharge  medications or the care you received while you were in the hospital after you are discharged, you can call the unit and asked to speak with the hospitalist on call if the hospitalist that took care of you is not available. Once you are discharged, your primary care physician will handle any further medical issues. Please note that NO REFILLS for any discharge medications will be authorized once you are discharged, as it is imperative that you return to your primary care physician (or establish a relationship with a primary care physician if you do not have one) for your aftercare needs so that they can reassess your need for medications and monitor your lab values.    On the day of Discharge:  VITAL SIGNS:  Blood pressure (!) 148/72, pulse 75, temperature 98.6 F (37 C), temperature source Oral, resp. rate 19, height 5\' 1"  (1.549 m), weight 71.8 kg, SpO2 94 %. PHYSICAL EXAMINATION:  GENERAL:  68 y.o.-year-old patient lying in the bed with no acute distress.  Well-appearing. EYES: Pupils equal, round, reactive to light and accommodation. No scleral icterus. Extraocular muscles intact.  HEENT: Head atraumatic, normocephalic. Oropharynx and nasopharynx clear.  NECK:  Supple, no jugular venous distention. No thyroid enlargement, no tenderness.  LUNGS: Normal breath sounds bilaterally, no wheezing, rales,rhonchi or crepitation. No use of accessory muscles of respiration.  CARDIOVASCULAR: RRR, S1, S2 normal. No murmurs, rubs, or gallops.  ABDOMEN: Soft, non-tender, non-distended. Bowel sounds present. No organomegaly or mass.  EXTREMITIES: No pedal edema, cyanosis, or clubbing.  NEUROLOGIC: Cranial nerves II through XII are intact. Muscle strength 5/5 in all extremities. Sensation intact. Gait not checked.  PSYCHIATRIC: The patient is alert and oriented x 3.  SKIN: No obvious rash, lesion, or ulcer.  DATA REVIEW:   CBC Recent Labs  Lab 03/09/19 0812  WBC 8.4  HGB 9.8*  HCT 27.9*  PLT 156     Chemistries  Recent Labs  Lab 03/07/19 0112  03/09/19 0812 03/09/19 1253  NA 133*   < > 135  --   K 2.9*   < > 2.6* 2.9*  CL 96*   < > 102  --   CO2 26   < > 25  --   GLUCOSE 113*   < > 97  --   BUN 11   < > 6*  --   CREATININE 0.73   < > 0.77  --   CALCIUM 8.8*   < > 8.0*  --   MG 1.8  --  1.5*  --   AST 47*  --   --   --   ALT 38  --   --   --   ALKPHOS 95  --   --   --   BILITOT 1.0  --   --   --    < > = values in this interval not displayed.     Microbiology Results  Results for orders placed or performed during the hospital encounter of 03/07/19  SARS CORONAVIRUS 2 (TAT 6-24 HRS) Nasopharyngeal Nasopharyngeal Swab     Status: None   Collection Time: 03/07/19  1:12 AM   Specimen: Nasopharyngeal Swab  Result Value Ref Range Status   SARS Coronavirus 2 NEGATIVE NEGATIVE Final    Comment: (NOTE) SARS-CoV-2 target nucleic acids are NOT DETECTED. The SARS-CoV-2 RNA is generally detectable in upper and lower respiratory specimens during the acute phase of infection. Negative results do not preclude SARS-CoV-2 infection, do not rule out co-infections with other pathogens, and should not be used as the sole basis for treatment or other patient management decisions. Negative results must be combined with clinical observations, patient history, and epidemiological information. The expected result is Negative. Fact Sheet for Patients: HairSlick.no Fact Sheet for Healthcare Providers: quierodirigir.com This test is not yet approved or cleared by the [place] FDA and  has been authorized for detection and/or diagnosis of SARS-CoV-2 by FDA under an Emergency Use Authorization (EUA). This EUA will remain  in effect (meaning this test can be used) for the duration of the COVID-19 declaration under Section 56 4(b)(1) of the Act, 21 U.S.C. section 360bbb-3(b)(1), unless the authorization is terminated or revoked  sooner. Performed at [facility], [address], [place], [place] [address]   Urine Culture     Status: None   Collection Time: 03/07/19  3:37 AM   Specimen: Urine, Clean Catch  Result Value Ref Range Status  Specimen Description   Final    URINE, CLEAN CATCH Performed at [facility], [address] [place], [place] [address]    Special Requests   Final    Normal Performed at [facility], [address] [place], [place] [address]    Culture   Final    NO GROWTH Performed at [facility], [address] [place], [place] [address]    Report Status 03/08/2019 FINAL  Final  Blood culture (routine x 2)     Status: None (Preliminary result)   Collection Time: 03/07/19  4:20 AM   Specimen: BLOOD  Result Value Ref Range Status   Specimen Description BLOOD RIGHT FA  Final   Special Requests   Final    BOTTLES DRAWN AEROBIC AND ANAEROBIC Blood Culture adequate volume   Culture   Final    NO GROWTH 2 DAYS Performed at [facility], [address] [place], [place] [address]    Report Status PENDING  Incomplete  Blood culture (routine x 2)     Status: None (Preliminary result)   Collection Time: 03/07/19  4:21 AM   Specimen: BLOOD  Result Value Ref Range Status   Specimen Description BLOOD LEFT FA  Final   Special Requests   Final    BOTTLES DRAWN AEROBIC AND ANAEROBIC Blood Culture adequate volume   Culture   Final    NO GROWTH 2 DAYS Performed at [facility], [address], [place], [place] [address]    Report Status PENDING  Incomplete  C difficile quick scan w PCR reflex     Status: None   Collection Time: 03/07/19  8:41 PM   Specimen: STOOL  Result Value Ref Range Status   C Diff antigen NEGATIVE NEGATIVE Final   C Diff toxin NEGATIVE NEGATIVE Final   C Diff interpretation No C. difficile detected.  Final    Comment: Performed at [facility], [address] [place], [place] [address]     RADIOLOGY:  No results found.   Management plans discussed with the patient, family and they are in agreement.  CODE STATUS: Full Code   TOTAL TIME TAKING CARE OF THIS PATIENT: 40 minutes.    Jinny Blossom Raye Wiens M.D on 03/09/2019 at 2:41 PM  Between 7am to 6pm - Pager [phone]  After 6pm go to [email or web address] - Social research officer, government  [facility] [place] [facility]  Office  [phone]  CC: Primary care physician; Melissa Ishihara, MD   Note: This dictation was prepared with Dragon dictation along with smaller phrase technology. Any transcriptional errors that result from this process are unintentional.

## 2019-03-10 LAB — GI PATHOGEN PANEL BY PCR, STOOL

## 2019-03-11 NOTE — Progress Notes (Deleted)
Cardiology Office Note   Date:  03/11/2019   ID:  [name], DOB 10-22-1950, MRN [id number]  PCP:  Leeroy Cha, MD  Cardiologist:  Dr. Debara Pickett No chief complaint on file.    History of Present Illness: [name] is a 68 y.o. female who presents for ongoing assessment and management of hypertension, hyperlipidemia, HOCM, OSA, history of DVT, and chronic chest pain.  Coronary CTA obtained on 11/18/2017 showed a coronary calcium score of 0 with normal coronary origin and right dominance with no evidence of CAD.  She was last seen in the office on 11/24/2017 and continues to have occasional episodes of laryngeal spasm.  Due to hypertension on that office visit hydralazine 10 mg 3 times a day was prescribed. Repeat echocardiogram on 03/07/2019 revealed normal LVEF of 60 to 65%.  Normal function with no LVH.   She was recently discharged from the hospital on 03/09/2019 in the setting of acute gastroenteritis along with fever of 101F.  She was also noted to be in SVT by EMS and was given 6 mg of IV adenosine and 5 mg of IV Lopressor.  On arrival to the hospital she had returned to normal sinus rhythm with a heart rate of 85 bpm.  She was treated with a 7-day course of ciprofloxacin and Flagyl.  C. difficile was found to be negative.  She was found to be hypokalemic which was repleted.  Past Medical History:  Diagnosis Date  . Bradycardia   . Chest pain   . Constipation   . Depression   . Diverticulitis   . Diverticulosis of colon   . DOE (dyspnea on exertion)   . DVT (deep venous thrombosis) ([place])   . Esophageal stricture   . Hyperlipemia   . Hypertension   . IBS (irritable bowel syndrome)   . Sleep apnea     Past Surgical History:  Procedure Laterality Date  . APPENDECTOMY    . BLOOD CLOT  2004   RIGHT LEG  . CHOLECYSTECTOMY    . COLON SURGERY    . GALLBLADDER SURGERY  2003  . NISSEN FUNDOPLICATION    . PLANTAR FASCIA SURGERY  2007  . sigmoid colectomy   10/21/2010   pelvic anastomosis  . VAGINAL HYSTERECTOMY       Current Outpatient Medications  Medication Sig Dispense Refill  . albuterol (PROVENTIL HFA;VENTOLIN HFA) 108 (90 Base) MCG/ACT inhaler Inhale 2 puffs into the lungs every 6 (six) hours as needed for wheezing or shortness of breath. 1 Inhaler 0  . aspirin 81 MG tablet Take 1 tablet (81 mg total) by mouth daily.    Marland Kitchen atorvastatin (LIPITOR) 20 MG tablet Take 20 mg by mouth daily.    . Cholecalciferol (VITAMIN D-3 PO) Take 1 capsule by mouth daily.    . ciprofloxacin (CIPRO) 500 MG tablet Take 1 tablet (500 mg total) by mouth 2 (two) times daily for 6 days. 11 tablet 0  . citalopram (CELEXA) 10 MG tablet Take 1 tablet by mouth at bedtime.  2  . famotidine (PEPCID) 20 MG tablet Take 20 mg by mouth at bedtime.     . fluticasone (FLOVENT HFA) 220 MCG/ACT inhaler Inhale 2 puffs into the lungs 2 (two) times daily. Can substitute any steroid inhaler best covered by insurance 1 Inhaler 0  . hydrALAZINE (APRESOLINE) 10 MG tablet Take 1 tablet (10 mg total) by mouth 3 (three) times daily. 270 tablet 1  . hydrochlorothiazide (HYDRODIURIL) 25 MG tablet Take 1 tablet (  25 mg total) by mouth daily. 30 tablet 3  . ketoconazole (NIZORAL) 2 % shampoo APPLY TO THE SCALP ONCE DAILY  2  . LORazepam (ATIVAN) 1 MG tablet Take 1 tablet (1 mg total) by mouth at bedtime as needed for anxiety. 15 tablet 0  . metoprolol succinate (TOPROL-XL) 25 MG 24 hr tablet TAKE 1 TABLET BY MOUTH TWICE DAILY FOR 90 DAYS  1  . metroNIDAZOLE (FLAGYL) 500 MG tablet Take 1 tablet (500 mg total) by mouth 3 (three) times daily for 6 days. 17 tablet 0  . omeprazole (PRILOSEC) 20 MG capsule Take 20 mg by mouth daily.     No current facility-administered medications for this visit.     Allergies:   Dicyclomine, Lisinopril, Losartan, Ace inhibitors, and Penicillins    Social History:  The patient  reports that she has never smoked. She has never used smokeless tobacco. She reports  that she does not drink alcohol or use drugs.   Family History:  The patient's family history includes Brain cancer in her father; Breast cancer in her cousin; Colitis in her sister; Colon polyps in her sister; Crohn's disease in her sister; Diabetes in her sister; Heart disease in her mother; Irritable bowel syndrome in her brother.    ROS: All other systems are reviewed and negative. Unless otherwise mentioned in H&P    PHYSICAL EXAM: VS:  There were no vitals taken for this visit. , BMI There is no height or weight on file to calculate BMI. GEN: Well nourished, well developed, in no acute distress HEENT: normal Neck: no JVD, carotid bruits, or masses Cardiac: ***RRR; no murmurs, rubs, or gallops,no edema  Respiratory:  Clear to auscultation bilaterally, normal work of breathing GI: soft, nontender, nondistended, + BS MS: no deformity or atrophy Skin: warm and dry, no rash Neuro:  Strength and sensation are intact Psych: euthymic mood, full affect   EKG:  EKG {ACTION; IS/IS [id number] ordered today. The ekg ordered today demonstrates ***   Recent Labs: Mar 28, 2019: ALT 38 03/09/2019: BUN 6; Creatinine, Ser 0.77; Hemoglobin 9.8; Magnesium 1.5; Platelets 156; Potassium 3.5; Sodium 135    Lipid Panel    Component Value Date/Time   CHOL 224 (H) 07/28/2016 0930   CHOL 209 (H) 11/22/2013 0232   TRIG 162 (H) 07/28/2016 0930   TRIG 99 11/22/2013 0232   HDL 57 07/28/2016 0930   HDL 56 11/22/2013 0232   CHOLHDL 3.9 07/28/2016 0930   VLDL 32 (H) 07/28/2016 0930   VLDL 20 11/22/2013 0232   LDLCALC 135 (H) 07/28/2016 0930   LDLCALC 133 (H) 11/22/2013 0232      Wt Readings from Last 3 Encounters:  03/09/19 158 lb 4.8 oz (71.8 kg)  03/11/18 153 lb 6 oz (69.6 kg)  11/24/17 151 lb (68.5 kg)      Other studies Reviewed: Echocardiogram 03/28/2019 1. Left ventricular ejection fraction, by visual estimation, is 60 to 65%. The left ventricle has normal function. There is no  left ventricular hypertrophy.  2. Global right ventricle has normal systolic function.The right ventricular size is normal. No increase in right ventricular wall thickness.  3. Left atrial size was normal.  4. Right atrial size was normal.  5. The mitral valve is normal in structure. Mild mitral valve regurgitation.  6. The tricuspid valve is normal in structure. Tricuspid valve regurgitation is trivial.  7. The aortic valve is normal in structure. Aortic valve regurgitation is mild by color flow Doppler.  8. The pulmonic valve  was normal in structure. Pulmonic valve regurgitation is trivial by color flow Doppler.  9. Normal pulmonary artery systolic pressure.  Echocardiogram 11/25/2018 Left ventricle: The cavity size was normal. There was mild focal basal hypertrophy of the septum. Systolic function was normal. The estimated ejection fraction was in the range of 55% to 60%. Wall motion was normal; there were no regional wall motion abnormalities. Features are consistent with a pseudonormal left ventricular filling pattern, with concomitant abnormal relaxation and increased filling pressure (grade 2 diastolic dysfunction). Doppler parameters are consistent with high ventricular filling pressure. - Aortic valve: Transvalvular velocity was within the normal range. There was no stenosis. There was mild regurgitation. Valve area (VTI): 3.84 cm^2. Valve area (Vmax): 3.23 cm^2. Valve area (Vmean): 3.55 cm^2. - Mitral valve: Transvalvular velocity was within the normal range. There was no evidence for stenosis. There was mild regurgitation. - Left atrium: The atrium was mildly dilated. - Right ventricle: The cavity size was normal. Wall thickness was normal. Systolic function was normal. - Atrial septum: No defect or patent foramen ovale was identified by color flow Doppler. - Tricuspid valve: There was mild regurgitation. - Pulmonary arteries: Systolic pressure  was within the normal range. PA peak pressure: 29 mm Hg (S).  Impressions:  - Mild hypertrophy of the basal septum. No left ventricular outflow tract gradient noted.   ASSESSMENT AND PLAN:  1.  ***   Current medicines are reviewed at length with the patient today.    Labs/ tests ordered today include: *** Phill Myron. [place], ANP, AACC   03/11/2019 7:02 PM    [address] Office [phone] Fax [phone]  Notice: This dictation was prepared with Dragon dictation along with smaller phrase technology. Any transcriptional errors that result from this process are unintentional and may not be corrected upon review.

## 2019-03-12 LAB — CULTURE, BLOOD (ROUTINE X 2)
Culture: NO GROWTH
Culture: NO GROWTH
Special Requests: ADEQUATE
Special Requests: ADEQUATE

## 2019-03-13 ENCOUNTER — Ambulatory Visit: Payer: PPO | Admitting: Adult Health

## 2019-03-20 DIAGNOSIS — F4322 Adjustment disorder with anxiety: Secondary | ICD-10-CM | POA: Diagnosis not present

## 2019-03-20 DIAGNOSIS — Z23 Encounter for immunization: Secondary | ICD-10-CM | POA: Diagnosis not present

## 2019-03-20 DIAGNOSIS — E785 Hyperlipidemia, unspecified: Secondary | ICD-10-CM | POA: Diagnosis not present

## 2019-03-20 DIAGNOSIS — K529 Noninfective gastroenteritis and colitis, unspecified: Secondary | ICD-10-CM | POA: Diagnosis not present

## 2019-03-20 DIAGNOSIS — I10 Essential (primary) hypertension: Secondary | ICD-10-CM | POA: Diagnosis not present

## 2019-03-28 NOTE — Progress Notes (Signed)
Cardiology Office Note   Date:  03/29/2019   ID:  [name], DOB [birth year], MRN [id number]  PCP:  Leeroy Cha, MD  Cardiologist: Dr. Debara Pickett CC: Hospital follow up    History of Present Illness: [name] is a 68 y.o. female who presents for ongoing assessment and management of HOCM, hypertension, hyperlipidemia, history of DVT, OSA, and chronic chest pain.  Patient had a negative cardiac catheterization in 2004, also a negative Myoview in February 2018 for atypical chest pain.  Coronary CTA was recommended due to her progressive symptoms when last seen in the office in June 2019.  Her CT scan revealed a coronary calcium score of 0, normal coronary origin with right dominance, no evidence of CAD.  When seen last in the office on 11/24/2017 she continued to have complaints of laryngeal spasms and baseline dyspnea.  Unfortunately, [name] was recently hospitalized due to nausea, diarrhea, generalized abdominal pain, fever of 101.0, and noted to be in SVT by EMS.  She was given 6 mg of IV adenosine and 5 mg of IV Lopressor.  On arrival to the ED she had regained normal sinus rhythm.  She was diagnosed with acute gastroenteritis and treated with a 7-day course of ciprofloxacin and Flagyl, C. difficile was negative, she was given potassium replacement which was caused by diarrhea.  CT scan of the chest was negative for PE.  She was found to be negative for SARS coronavirus 2.  [name] is here today with complaints of elevated blood pressure.  She states that since she has been released from the hospital her blood pressure has been running in the high 170s to 180s.  She has occasionally had elevated heart rate and has been short of breath with minimal exertion "climbing her stairs to her home (5 steps).  She states that at rest she occasionally has rapid heart rhythm, just sitting watching television.  She admits to not being very active and drinking caffeine.  Past Medical  History:  Diagnosis Date   Bradycardia    Chest pain    Constipation    Depression    Diverticulitis    Diverticulosis of colon    DOE (dyspnea on exertion)    DVT (deep venous thrombosis) (HCC)    Esophageal stricture    Hyperlipemia    Hypertension    IBS (irritable bowel syndrome)    Sleep apnea     Past Surgical History:  Procedure Laterality Date   APPENDECTOMY     BLOOD CLOT  2004   RIGHT LEG   CHOLECYSTECTOMY     COLON SURGERY     GALLBLADDER SURGERY  123456   NISSEN FUNDOPLICATION     PLANTAR FASCIA SURGERY  2007   sigmoid colectomy  10/21/2010   pelvic anastomosis   VAGINAL HYSTERECTOMY       Current Outpatient Medications  Medication Sig Dispense Refill   albuterol (PROVENTIL HFA;VENTOLIN HFA) 108 (90 Base) MCG/ACT inhaler Inhale 2 puffs into the lungs every 6 (six) hours as needed for wheezing or shortness of breath. 1 Inhaler 0   aspirin 81 MG tablet Take 1 tablet (81 mg total) by mouth daily.     atorvastatin (LIPITOR) 20 MG tablet Take 20 mg by mouth daily.     Cholecalciferol (VITAMIN D-3 PO) Take 1 capsule by mouth daily.     citalopram (CELEXA) 10 MG tablet Take 1 tablet by mouth at bedtime.  2   famotidine (PEPCID) 20 MG tablet Take 20 mg  by mouth at bedtime.      hydrALAZINE (APRESOLINE) 25 MG tablet Take 1 tablet (25 mg total) by mouth 3 (three) times daily. 270 tablet 3   hydrochlorothiazide (HYDRODIURIL) 25 MG tablet Take 1 tablet (25 mg total) by mouth daily. 30 tablet 3   LORazepam (ATIVAN) 1 MG tablet Take 1 tablet (1 mg total) by mouth at bedtime as needed for anxiety. (Patient taking differently: Take 0.5 mg by mouth at bedtime as needed for anxiety. ) 15 tablet 0   metoprolol succinate (TOPROL-XL) 25 MG 24 hr tablet TAKE 1 TABLET BY MOUTH TWICE DAILY FOR 90 DAYS  1   omeprazole (PRILOSEC) 20 MG capsule Take 20 mg by mouth daily.     No current facility-administered medications for this visit.     Allergies:    Dicyclomine, Lisinopril, Losartan, Ace inhibitors, and Penicillins    Social History:  The patient  reports that she has never smoked. She has never used smokeless tobacco. She reports that she does not drink alcohol or use drugs.   Family History:  The patient's family history includes Brain cancer in her father; Breast cancer in her cousin; Colitis in her sister; Colon polyps in her sister; Crohn's disease in her sister; Diabetes in her sister; Heart disease in her mother; Irritable bowel syndrome in her brother.    ROS: All other systems are reviewed and negative. Unless otherwise mentioned in H&P    PHYSICAL EXAM: VS:  BP (!) 160/70    Pulse 60    Ht 5\' 1"  (1.549 m)    Wt 156 lb (70.8 kg)    SpO2 99%    BMI 29.48 kg/m  , BMI Body mass index is 29.48 kg/m. GEN: Well nourished, well developed, in no acute distress HEENT: normal Neck: no JVD, carotid bruits, or masses Cardiac: RRR; no murmurs, rubs, or gallops,no edema  Respiratory:  Clear to auscultation bilaterally, normal work of breathing GI: soft, nontender, nondistended, + BS MS: no deformity or atrophy Skin: warm and dry, no rash Neuro:  Strength and sensation are intact Psych: euthymic mood, full affect   EKG: Normal sinus rhythm, heart rate of 60 bpm.  Recent Labs: 03/07/2019: ALT 38 03/09/2019: BUN 6; Creatinine, Ser 0.77; Hemoglobin 9.8; Magnesium 1.5; Platelets 156; Potassium 3.5; Sodium 135    Lipid Panel    Component Value Date/Time   CHOL 224 (H) 07/28/2016 0930   CHOL 209 (H) 11/22/2013 0232   TRIG 162 (H) 07/28/2016 0930   TRIG 99 11/22/2013 0232   HDL 57 07/28/2016 0930   HDL 56 11/22/2013 0232   CHOLHDL 3.9 07/28/2016 0930   VLDL 32 (H) 07/28/2016 0930   VLDL 20 11/22/2013 0232   LDLCALC 135 (H) 07/28/2016 0930   LDLCALC 133 (H) 11/22/2013 0232      Wt Readings from Last 3 Encounters:  03/29/19 156 lb (70.8 kg)  03/09/19 158 lb 4.8 oz (71.8 kg)  03/11/18 153 lb 6 oz (69.6 kg)      Other  studies Reviewed: Study Conclusions  - Left ventricle: The cavity size was normal. There was mild focal basal hypertrophy of the septum. Systolic function was normal. The estimated ejection fraction was in the range of 55% to 60%. Wall motion was normal; there were no regional wall motion abnormalities. Features are consistent with a pseudonormal left ventricular filling pattern, with concomitant abnormal relaxation and increased filling pressure (grade 2 diastolic dysfunction). Doppler parameters are consistent with high ventricular filling pressure. - Aortic  valve: Transvalvular velocity was within the normal range. There was no stenosis. There was mild regurgitation. Valve area (VTI): 3.84 cm^2. Valve area (Vmax): 3.23 cm^2. Valve area (Vmean): 3.55 cm^2. - Mitral valve: Transvalvular velocity was within the normal range. There was no evidence for stenosis. There was mild regurgitation. - Left atrium: The atrium was mildly dilated. - Right ventricle: The cavity size was normal. Wall thickness was normal. Systolic function was normal. - Atrial septum: No defect or patent foramen ovale was identified by color flow Doppler. - Tricuspid valve: There was mild regurgitation. - Pulmonary arteries: Systolic pressure was within the normal range. PA peak pressure: 29 mm Hg (S).  Impressions:  - Mild hypertrophy of the basal septum. No left ventricular outflow tract gradient noted.   ASSESSMENT AND PLAN:  1.  Hypertension: I have rechecked her blood pressure today in the office and it was 164/75.  She is currently on hydralazine 10 mg 3 times daily, I will increase it to 25 mg 3 times daily.  She will continue on metoprolol and HCTZ.  May need to consider providing low-dose propanolol as needed for rapid heart rhythm should it recur.  She does have a history of PSVT.  This was noted on her transport to ER in the setting of acute gastroenteritis.  I have  provided her with a "salty 6" pamphlet concerning high salt foods.  She is given a blood pressure recording sheet and is to take her blood pressure every day and record this for follow-up.  We will see her again in 2 weeks to see how her blood pressure responds to higher dose of hydralazine.  Most recent labs while hospitalized did show evidence of hypokalemia but this was repleted.  2.  Hypercholesterolemia: She remains on atorvastatin 20 mg daily.  She does not have a history of CAD, with recent coronary CT with calcium score of 0 and no evidence of coronary artery disease.  Goal of LDL less than 100.  3.  PSVT: The patient states that she occasionally has racing heart and palpitations.  This can be found with SSRIs as well as anxiety and caffeine intake.  I have advised her on the caffeine intake, and to decrease it substantially as she appears to be very sensitive to caffeine.  She states her recent rapid heart rhythm was related to drinking 2 glasses of iced tea in a restaurant with her daughter earlier this week.  She verbalizes understanding.  4. HOCM: Heart rate control is recommended with blood pressure management, and afterload reduction.  Hopefully increased dose of hydralazine will be helpful.  If not may need to consider increasing dose of metoprolol.  With a history of asthmatic bronchitis, may need to consider bisoprolol instead   Current medicines are reviewed at length with the patient today.    Labs/ tests ordered today include: None  Phill Myron. West Pugh, ANP, AACC   03/29/2019 9:58 AM    [facility] [address] Office [phone] Fax [phone]  Notice: This dictation was prepared with Dragon dictation along with smaller phrase technology. Any transcriptional errors that result from this process are unintentional and may not be corrected upon review.

## 2019-03-29 ENCOUNTER — Encounter: Payer: Self-pay | Admitting: Adult Health

## 2019-03-29 ENCOUNTER — Ambulatory Visit (INDEPENDENT_AMBULATORY_CARE_PROVIDER_SITE_OTHER): Payer: PPO | Admitting: Adult Health

## 2019-03-29 ENCOUNTER — Other Ambulatory Visit: Payer: Self-pay

## 2019-03-29 VITALS — BP 160/70 | HR 60 | Ht 61.0 in | Wt 156.0 lb

## 2019-03-29 DIAGNOSIS — I10 Essential (primary) hypertension: Secondary | ICD-10-CM

## 2019-03-29 DIAGNOSIS — E7800 Pure hypercholesterolemia, unspecified: Secondary | ICD-10-CM

## 2019-03-29 DIAGNOSIS — I422 Other hypertrophic cardiomyopathy: Secondary | ICD-10-CM | POA: Diagnosis not present

## 2019-03-29 MED ORDER — HYDRALAZINE HCL 25 MG PO TABS: 25.0000 mg | ORAL_TABLET | Freq: Three times a day (TID) | ORAL | 3 refills | Status: DC

## 2019-03-29 NOTE — Patient Instructions (Signed)
Medication Instructions:  INCREASE- Hydralazine 25 mg by mouth three times a day  *If you need a refill on your cardiac medications before your next appointment, please call your pharmacy*  Lab Work: None Ordered  Testing/Procedures: None Ordered  Follow-Up: At [facility], you and your health needs are our priority.  As part of our continuing mission to provide you with exceptional heart care, we have created designated Provider Care Teams.  These Care Teams include your primary Cardiologist (physician) and Advanced Practice Providers (APPs -  Physician Assistants and Nurse Practitioners) who all work together to provide you with the care you need, when you need it.  Your next appointment:   Monday December 7th @ 2:45 pm  The format for your next appointment:   In Person  Provider:   Jory Sims, DNP, ANP

## 2019-03-31 DIAGNOSIS — E785 Hyperlipidemia, unspecified: Secondary | ICD-10-CM | POA: Diagnosis not present

## 2019-03-31 DIAGNOSIS — J449 Chronic obstructive pulmonary disease, unspecified: Secondary | ICD-10-CM | POA: Diagnosis not present

## 2019-03-31 DIAGNOSIS — I10 Essential (primary) hypertension: Secondary | ICD-10-CM | POA: Diagnosis not present

## 2019-03-31 DIAGNOSIS — J410 Simple chronic bronchitis: Secondary | ICD-10-CM | POA: Diagnosis not present

## 2019-04-12 NOTE — Progress Notes (Signed)
Cardiology Office Note   Date:  04/17/2019   ID:  [name], DOB August 19, 1950, MRN [id number]  PCP:  Leeroy Cha, MD  Cardiologist: Dr. Debara Pickett No chief complaint on file.    History of Present Illness: [name] is a 68 y.o. female who presents for ongoing assessment and management of HOCM, hypertension, hyperlipidemia, history of DVT, OSA, and chronic chest pain.  Patient had a negative cardiac catheterization in 2004, also a negative Myoview in February 2018 for atypical chest pain.  Coronary CTA was recommended due to her progressive symptoms when last seen in the office in June 2019.  Her CT scan revealed a coronary calcium score of 0, normal coronary origin with right dominance, no evidence of CAD.   [name] was hospitalized with acute gastroenteritis in November 2020 and was also found to have SVT.  She was treated with adenosine and Lopressor.  She returned to normal sinus rhythm.  When seen last on 03/29/2019 she complained of elevated blood pressure.  She also complained of shortness of breath while climbing stairs in her home (only 5 steps) which was unusual for her.  She also continued to complain of rapid rhythm while watching television, or with minimal exertion.  On office visit dated 03/29/2019 I increased her hydralazine to 25 mg 3 times daily from 10 mg 3 times daily, she was to continue metoprolol and HCTZ.  She was to take her blood pressure daily around the same time, recorded, and bring it back with her on this follow-up appointment.  She was also advised on decreasing caffeine intake.  She was drinking a lot of iced tea when she goes out to [employer].  This is normally associated with rapid heart rhythm when she gets home.  We also consider changing metoprolol to bisoprolol if her breathing status did not improve due to history of asthmatic bronchitis.  [name]  comes today with a list of her blood pressures.  They are elevated in the morning in  the 150s to high 160s, normalize in the afternoon and then are high again at nighttime similar to those during the morning.  The patient has not cut back on any salt that she is eating.  Just before she came today she had a steak biscuit, she eats out a lot with her family.  Past Medical History:  Diagnosis Date  . Bradycardia   . Chest pain   . Constipation   . Depression   . Diverticulitis   . Diverticulosis of colon   . DOE (dyspnea on exertion)   . DVT (deep venous thrombosis) ([place])   . Esophageal stricture   . Hyperlipemia   . Hypertension   . IBS (irritable bowel syndrome)   . Sleep apnea     Past Surgical History:  Procedure Laterality Date  . APPENDECTOMY    . BLOOD CLOT  2004   RIGHT LEG  . CHOLECYSTECTOMY    . COLON SURGERY    . GALLBLADDER SURGERY  2003  . NISSEN FUNDOPLICATION    . PLANTAR FASCIA SURGERY  2007  . sigmoid colectomy  10/21/2010   pelvic anastomosis  . VAGINAL HYSTERECTOMY       Current Outpatient Medications  Medication Sig Dispense Refill  . albuterol (PROVENTIL HFA;VENTOLIN HFA) 108 (90 Base) MCG/ACT inhaler Inhale 2 puffs into the lungs every 6 (six) hours as needed for wheezing or shortness of breath. 1 Inhaler 0  . aspirin 81 MG tablet Take 1 tablet (81  mg total) by mouth daily.    Marland Kitchen atorvastatin (LIPITOR) 20 MG tablet Take 20 mg by mouth daily.    . Cholecalciferol (VITAMIN D-3 PO) Take 1 capsule by mouth daily.    . citalopram (CELEXA) 20 MG tablet Take 20 mg by mouth daily.    . famotidine (PEPCID) 20 MG tablet Take 20 mg by mouth at bedtime.     . fluticasone (FLONASE) 50 MCG/ACT nasal spray Place 2 sprays into both nostrils as needed.    . hydrALAZINE (APRESOLINE) 25 MG tablet Take 1.5 tablets (37.5 mg total) by mouth 3 (three) times daily. 315 tablet 3  . hydrochlorothiazide (HYDRODIURIL) 25 MG tablet Take 1 tablet (25 mg total) by mouth daily. 30 tablet 3  . LORazepam (ATIVAN) 1 MG tablet Take 1 tablet (1 mg total) by mouth at  bedtime as needed for anxiety. (Patient taking differently: Take 0.5 mg by mouth at bedtime as needed for anxiety. ) 15 tablet 0  . metoprolol succinate (TOPROL-XL) 25 MG 24 hr tablet TAKE 1 TABLET BY MOUTH TWICE DAILY FOR 90 DAYS  1  . omeprazole (PRILOSEC) 20 MG capsule Take 20 mg by mouth daily.     No current facility-administered medications for this visit.     Allergies:   Dicyclomine, Lisinopril, Losartan, Ace inhibitors, and Penicillins    Social History:  The patient  reports that she has never smoked. She has never used smokeless tobacco. She reports that she does not drink alcohol or use drugs.   Family History:  The patient's family history includes Brain cancer in her father; Breast cancer in her cousin; Colitis in her sister; Colon polyps in her sister; Crohn's disease in her sister; Diabetes in her sister; Heart disease in her mother; Irritable bowel syndrome in her brother.    ROS: All other systems are reviewed and negative. Unless otherwise mentioned in H&P    PHYSICAL EXAM: VS:  BP (!) 163/66   Pulse 60   Temp (!) 97 F (36.1 C)   Ht 5\' 1"  (1.549 m)   Wt 152 lb (68.9 kg)   SpO2 99%   BMI 28.72 kg/m  , BMI Body mass index is 28.72 kg/m. GEN: Well nourished, well developed, in no acute distress HEENT: normal Neck: no JVD, carotid bruits, or masses Cardiac: RRR; no murmurs, rubs, or gallops,no edema  Respiratory:  Clear to auscultation bilaterally, normal work of breathing GI: soft, nontender, nondistended, + BS MS: no deformity or atrophy Skin: warm and dry, no rash Neuro:  Strength and sensation are intact Psych: euthymic mood, full affect   EKG: Not completed this office visit  Recent Labs: 03/07/2019: ALT 38 03/09/2019: BUN 6; Creatinine, Ser 0.77; Hemoglobin 9.8; Magnesium 1.5; Platelets 156; Potassium 3.5; Sodium 135    Lipid Panel    Component Value Date/Time   CHOL 224 (H) 07/28/2016 0930   CHOL 209 (H) 11/22/2013 0232   TRIG 162 (H)  07/28/2016 0930   TRIG 99 11/22/2013 0232   HDL 57 07/28/2016 0930   HDL 56 11/22/2013 0232   CHOLHDL 3.9 07/28/2016 0930   VLDL 32 (H) 07/28/2016 0930   VLDL 20 11/22/2013 0232   LDLCALC 135 (H) 07/28/2016 0930   LDLCALC 133 (H) 11/22/2013 0232      Wt Readings from Last 3 Encounters:  04/17/19 152 lb (68.9 kg)  03/29/19 156 lb (70.8 kg)  03/09/19 158 lb 4.8 oz (71.8 kg)      Other studies Reviewed: Echocardiogram 04-Apr-2019  Left ventricle: The cavity size was normal. There was mild focal basal hypertrophy of the septum. Systolic function was normal. The estimated ejection fraction was in the range of 55% to 60%. Wall motion was normal; there were no regional wall motion abnormalities. Features are consistent with a pseudonormal left ventricular filling pattern, with concomitant abnormal relaxation and increased filling pressure (grade 2 diastolic dysfunction). Doppler parameters are consistent with high ventricular filling pressure. - Aortic valve: Transvalvular velocity was within the normal range. There was no stenosis. There was mild regurgitation. Valve area (VTI): 3.84 cm^2. Valve area (Vmax): 3.23 cm^2. Valve area (Vmean): 3.55 cm^2. - Mitral valve: Transvalvular velocity was within the normal range. There was no evidence for stenosis. There was mild regurgitation. - Left atrium: The atrium was mildly dilated. - Right ventricle: The cavity size was normal. Wall thickness was normal. Systolic function was normal. - Atrial septum: No defect or patent foramen ovale was identified by color flow Doppler. - Tricuspid valve: There was mild regurgitation. - Pulmonary arteries: Systolic pressure was within the normal range. PA peak pressure: 29 mm Hg (S).  Impressions:  - Mild hypertrophy of the basal septum. No left ventricular outflow tract gradient noted.   ASSESSMENT AND PLAN:  1.  Hypertension: Currently not well controlled  in the a.m. and p.m. hours with normalization in the middle of the day.  I will increase hydralazine to 37.5 mg 3 times daily.  I have given her a copy of a low-sodium diet and "salty 6" so that she will be aware of the salt that she may be eating without realizing it high levels.  She will continue on HCTZ, and metoprolol.  She does not tolerate higher doses of metoprolol as she was found to be bradycardic on recent ED visit on higher doses.  She will keep track of her blood pressure daily as she has been and I will see her again in 1 month.  She is unable to do virtual and [place] and [place] with conductivity.  2.  Mixed hyperlipidemia: She is now on atorvastatin 20 mg daily.  Most recent calculated LDL was 135 in March 2018.  Labs are completed by PCP.  If not completed on next office visit I will order fasting lipids and LFTs to assess status and need to increase atorvastatin dosing.  3.  Diabetes: Continues on Metformin.  Followed by PCP.   Current medicines are reviewed at length with the patient today.    Labs/ tests ordered today include:  Phill Myron. West Pugh, ANP, AACC   04/17/2019 3:26 PM    [facility] [place] [address] Office [phone] Fax [phone]  Notice: This dictation was prepared with Dragon dictation along with smaller phrase technology. Any transcriptional errors that result from this process are unintentional and may not be corrected upon review.

## 2019-04-17 ENCOUNTER — Encounter: Payer: Self-pay | Admitting: Adult Health

## 2019-04-17 ENCOUNTER — Other Ambulatory Visit: Payer: Self-pay

## 2019-04-17 ENCOUNTER — Ambulatory Visit (INDEPENDENT_AMBULATORY_CARE_PROVIDER_SITE_OTHER): Payer: PPO | Admitting: Adult Health

## 2019-04-17 VITALS — BP 163/66 | HR 60 | Temp 97.0°F | Ht 61.0 in | Wt 152.0 lb

## 2019-04-17 DIAGNOSIS — Z79899 Other long term (current) drug therapy: Secondary | ICD-10-CM

## 2019-04-17 MED ORDER — HYDRALAZINE HCL 25 MG PO TABS: 37.5000 mg | ORAL_TABLET | Freq: Three times a day (TID) | ORAL | 3 refills | Status: DC

## 2019-04-17 NOTE — Patient Instructions (Addendum)
Medication Instructions:  INCREASE- Hydralazine 37.5 mg(1 1/2 tablets) by mouth three times a day  *If you need a refill on your cardiac medications before your next appointment, please call your pharmacy*  Lab Work: BMP today  If you have labs (blood work) drawn today and your tests are completely normal, you will receive your results only by: Marland Kitchen MyChart Message (if you have MyChart) OR . A paper copy in the mail If you have any lab test that is abnormal or we need to change your treatment, we will call you to review the results.  Testing/Procedures: None Ordered  Follow-Up: At [facility], you and your health needs are our priority.  As part of our continuing mission to provide you with exceptional heart care, we have created designated Provider Care Teams.  These Care Teams include your primary Cardiologist (physician) and Advanced Practice Providers (APPs -  Physician Assistants and Nurse Practitioners) who all work together to provide you with the care you need, when you need it.  Your next appointment:   1 month(s) Monday January 11th @ 1:45 pm  The format for your next appointment:   In Person  Provider:   Jory Sims, DNP, ANP  Other Instructions

## 2019-04-18 ENCOUNTER — Ambulatory Visit: Payer: PPO

## 2019-04-18 LAB — BASIC METABOLIC PANEL
BUN/Creatinine Ratio: 11 — ABNORMAL LOW (ref 12–28)
BUN: 9 mg/dL (ref 8–27)
CO2: 23 mmol/L (ref 20–29)
Calcium: 9.6 mg/dL (ref 8.7–10.3)
Chloride: 96 mmol/L (ref 96–106)
Creatinine, Ser: 0.81 mg/dL (ref 0.57–1.00)
GFR calc Af Amer: 86 mL/min/{1.73_m2} (ref >59)
GFR calc non Af Amer: 75 mL/min/{1.73_m2} (ref >59)
Glucose: 107 mg/dL — ABNORMAL HIGH (ref 65–99)
Potassium: 3.3 mmol/L — ABNORMAL LOW (ref 3.5–5.2)
Sodium: 136 mmol/L (ref 134–144)

## 2019-04-20 ENCOUNTER — Telehealth: Payer: Self-pay | Admitting: *Deleted

## 2019-04-20 DIAGNOSIS — Z79899 Other long term (current) drug therapy: Secondary | ICD-10-CM

## 2019-04-20 MED ORDER — SPIRONOLACTONE 25 MG PO TABS: 25.0000 mg | ORAL_TABLET | Freq: Every day | ORAL | 3 refills | Status: DC

## 2019-04-20 NOTE — Telephone Encounter (Signed)
-----   Message from Lendon Colonel, NP sent at 04/18/2019  7:42 AM EST ----- Potassium is low.  Will need to have her go back to 25 mg of hydralazine TID and add spironolactone 25 mg daily to her regimen. Repeat BMET in one week.

## 2019-04-20 NOTE — Telephone Encounter (Signed)
Pt aware of her blood work, pt aware to go back taking 25 mg TID of Hydralazine and start Spironolactone 25 mg daily. BMP ordered pt stated she will be in next Friday to get blood work done.

## 2019-05-02 DIAGNOSIS — E785 Hyperlipidemia, unspecified: Secondary | ICD-10-CM | POA: Diagnosis not present

## 2019-05-02 DIAGNOSIS — I10 Essential (primary) hypertension: Secondary | ICD-10-CM | POA: Diagnosis not present

## 2019-05-02 DIAGNOSIS — J449 Chronic obstructive pulmonary disease, unspecified: Secondary | ICD-10-CM | POA: Diagnosis not present

## 2019-05-11 ENCOUNTER — Ambulatory Visit: Payer: PPO

## 2019-05-16 ENCOUNTER — Ambulatory Visit: Payer: PPO

## 2019-05-18 ENCOUNTER — Ambulatory Visit: Payer: PPO

## 2019-05-22 ENCOUNTER — Ambulatory Visit: Payer: PPO | Admitting: Adult Health

## 2019-05-22 DIAGNOSIS — F5101 Primary insomnia: Secondary | ICD-10-CM | POA: Diagnosis not present

## 2019-05-22 DIAGNOSIS — E785 Hyperlipidemia, unspecified: Secondary | ICD-10-CM | POA: Diagnosis not present

## 2019-05-22 DIAGNOSIS — I10 Essential (primary) hypertension: Secondary | ICD-10-CM | POA: Diagnosis not present

## 2019-05-22 DIAGNOSIS — K219 Gastro-esophageal reflux disease without esophagitis: Secondary | ICD-10-CM | POA: Diagnosis not present

## 2019-05-22 DIAGNOSIS — F4322 Adjustment disorder with anxiety: Secondary | ICD-10-CM | POA: Diagnosis not present

## 2019-05-25 ENCOUNTER — Ambulatory Visit: Payer: PPO

## 2019-05-29 DIAGNOSIS — E7800 Pure hypercholesterolemia, unspecified: Secondary | ICD-10-CM | POA: Diagnosis not present

## 2019-05-29 DIAGNOSIS — J449 Chronic obstructive pulmonary disease, unspecified: Secondary | ICD-10-CM | POA: Diagnosis not present

## 2019-05-29 DIAGNOSIS — J410 Simple chronic bronchitis: Secondary | ICD-10-CM | POA: Diagnosis not present

## 2019-05-29 DIAGNOSIS — I10 Essential (primary) hypertension: Secondary | ICD-10-CM | POA: Diagnosis not present

## 2019-05-29 DIAGNOSIS — E785 Hyperlipidemia, unspecified: Secondary | ICD-10-CM | POA: Diagnosis not present

## 2019-06-01 ENCOUNTER — Other Ambulatory Visit: Payer: Self-pay | Admitting: Internal Medicine

## 2019-06-01 DIAGNOSIS — Z1231 Encounter for screening mammogram for malignant neoplasm of breast: Secondary | ICD-10-CM

## 2019-06-04 NOTE — Progress Notes (Deleted)
Cardiology Office Note   Date:  06/04/2019   ID:  [name], DOB 09-Nov-1950, MRN [id number]  PCP:  Melissa Cha, MD  Cardiologist: Dr. Debara Pickett No chief complaint on file.    History of Present Illness: [name] is a 69 y.o. female who presents for ongoing assessment and management of HOCM, hypertension, hyperlipidemia, history of DVT, OSA, and chronic chest pain. Patient had a negative cardiac catheterization in 2004, also a negative Myoview in February 2018 for atypical chest pain. Coronary CTA was recommended due to her progressive symptoms when last seen in the office in June 2019. Her CT scan revealed a coronary calcium score of 0, normal coronary origin with right dominance, no evidence of CAD. [name] was hospitalized with acute gastroenteritis in November 2020 and was also found to have SVT.  She was treated with adenosine and Lopressor.  She returned to normal sinus rhythm.    On last office visit dated 05/17/2018 she was found to be hypertensive.  She admitted to not adhering to a low-sodium diet.  She was educated on low-sodium diet and given a copy of the "salty 6" pamphlet.  She was to continue metoprolol and HCTZ, she was not tolerant of higher doses of metoprolol due to bradycardia.  Spironolactone was added to her medication regimen 25 mg daily and to take hydralazine 25 mg 3 times daily.  She is here for blood pressure evaluation follow-up.  She also had fasting lipids and LFTs on last office visit.  I do not have those results.  Past Medical History:  Diagnosis Date  . Bradycardia   . Chest pain   . Constipation   . Depression   . Diverticulitis   . Diverticulosis of colon   . DOE (dyspnea on exertion)   . DVT (deep venous thrombosis) ([place])   . Esophageal stricture   . Hyperlipemia   . Hypertension   . IBS (irritable bowel syndrome)   . Sleep apnea     Past Surgical History:  Procedure Laterality Date  . APPENDECTOMY    . BLOOD CLOT   2004   RIGHT LEG  . CHOLECYSTECTOMY    . COLON SURGERY    . GALLBLADDER SURGERY  2003  . NISSEN FUNDOPLICATION    . PLANTAR FASCIA SURGERY  2007  . sigmoid colectomy  10/21/2010   pelvic anastomosis  . VAGINAL HYSTERECTOMY       Current Outpatient Medications  Medication Sig Dispense Refill  . albuterol (PROVENTIL HFA;VENTOLIN HFA) 108 (90 Base) MCG/ACT inhaler Inhale 2 puffs into the lungs every 6 (six) hours as needed for wheezing or shortness of breath. 1 Inhaler 0  . aspirin 81 MG tablet Take 1 tablet (81 mg total) by mouth daily.    Marland Kitchen atorvastatin (LIPITOR) 20 MG tablet Take 20 mg by mouth daily.    . Cholecalciferol (VITAMIN D-3 PO) Take 1 capsule by mouth daily.    . citalopram (CELEXA) 20 MG tablet Take 20 mg by mouth daily.    . famotidine (PEPCID) 20 MG tablet Take 20 mg by mouth at bedtime.     . fluticasone (FLONASE) 50 MCG/ACT nasal spray Place 2 sprays into both nostrils as needed.    . hydrALAZINE (APRESOLINE) 25 MG tablet Take 1.5 tablets (37.5 mg total) by mouth 3 (three) times daily. 315 tablet 3  . hydrochlorothiazide (HYDRODIURIL) 25 MG tablet Take 1 tablet (25 mg total) by mouth daily. 30 tablet 3  . LORazepam (ATIVAN) 1 MG tablet  Take 1 tablet (1 mg total) by mouth at bedtime as needed for anxiety. (Patient taking differently: Take 0.5 mg by mouth at bedtime as needed for anxiety. ) 15 tablet 0  . metoprolol succinate (TOPROL-XL) 25 MG 24 hr tablet TAKE 1 TABLET BY MOUTH TWICE DAILY FOR 90 DAYS  1  . omeprazole (PRILOSEC) 20 MG capsule Take 20 mg by mouth daily.    Marland Kitchen spironolactone (ALDACTONE) 25 MG tablet Take 1 tablet (25 mg total) by mouth daily. 90 tablet 3   No current facility-administered medications for this visit.    Allergies:   Dicyclomine, Lisinopril, Losartan, Ace inhibitors, and Penicillins    Social History:  The patient  reports that she has never smoked. She has never used smokeless tobacco. She reports that she does not drink alcohol or use  drugs.   Family History:  The patient's family history includes Brain cancer in her father; Breast cancer in her cousin; Colitis in her sister; Colon polyps in her sister; Crohn's disease in her sister; Diabetes in her sister; Heart disease in her mother; Irritable bowel syndrome in her brother.    ROS: All other systems are reviewed and negative. Unless otherwise mentioned in H&P    PHYSICAL EXAM: VS:  There were no vitals taken for this visit. , BMI There is no height or weight on file to calculate BMI. GEN: Well nourished, well developed, in no acute distress HEENT: normal Neck: no JVD, carotid bruits, or masses Cardiac: ***RRR; no murmurs, rubs, or gallops,no edema  Respiratory:  Clear to auscultation bilaterally, normal work of breathing GI: soft, nontender, nondistended, + BS MS: no deformity or atrophy Skin: warm and dry, no rash Neuro:  Strength and sensation are intact Psych: euthymic mood, full affect   EKG:  EKG {ACTION; IS/IS [id number] ordered today. The ekg ordered today demonstrates ***   Recent Labs: 03/07/2019: ALT 38 03/09/2019: Hemoglobin 9.8; Magnesium 1.5; Platelets 156 04/17/2019: BUN 9; Creatinine, Ser 0.81; Potassium 3.3; Sodium 136    Lipid Panel    Component Value Date/Time   CHOL 224 (H) 07/28/2016 0930   CHOL 209 (H) 11/22/2013 0232   TRIG 162 (H) 07/28/2016 0930   TRIG 99 11/22/2013 0232   HDL 57 07/28/2016 0930   HDL 56 11/22/2013 0232   CHOLHDL 3.9 07/28/2016 0930   VLDL 32 (H) 07/28/2016 0930   VLDL 20 11/22/2013 0232   LDLCALC 135 (H) 07/28/2016 0930   LDLCALC 133 (H) 11/22/2013 0232      Wt Readings from Last 3 Encounters:  04/17/19 152 lb (68.9 kg)  03/29/19 156 lb (70.8 kg)  03/09/19 158 lb 4.8 oz (71.8 kg)      Other studies Reviewed: Additional studies/ records that were reviewed today include: ***. Review of the above records demonstrates: ***   ASSESSMENT AND PLAN:  1.  ***   Current medicines are reviewed at  length with the patient today.    Labs/ tests ordered today include: *** Phill Myron. West Pugh, ANP, [facility]   06/04/2019 10:05 AM    [facility] [place] [address] Office [phone] Fax [phone]  Notice: This dictation was prepared with Dragon dictation along with smaller phrase technology. Any transcriptional errors that result from this process are unintentional and may not be corrected upon review.

## 2019-06-05 ENCOUNTER — Ambulatory Visit: Payer: PPO | Admitting: Adult Health

## 2019-06-19 DIAGNOSIS — Z1211 Encounter for screening for malignant neoplasm of colon: Secondary | ICD-10-CM | POA: Diagnosis not present

## 2019-06-19 DIAGNOSIS — Z01419 Encounter for gynecological examination (general) (routine) without abnormal findings: Secondary | ICD-10-CM | POA: Diagnosis not present

## 2019-06-19 DIAGNOSIS — Z0000 Encounter for general adult medical examination without abnormal findings: Secondary | ICD-10-CM | POA: Diagnosis not present

## 2019-06-19 DIAGNOSIS — Z1231 Encounter for screening mammogram for malignant neoplasm of breast: Secondary | ICD-10-CM | POA: Diagnosis not present

## 2019-06-19 DIAGNOSIS — Z124 Encounter for screening for malignant neoplasm of cervix: Secondary | ICD-10-CM | POA: Diagnosis not present

## 2019-06-22 NOTE — Progress Notes (Deleted)
Cardiology Office Note   Date:  06/22/2019   ID:  MALISIA COWLEY, DOB [birth year], MRN [id number]  PCP:  Leeroy Cha, MD  Cardiologist: Dr. Debara Pickett No chief complaint on file.    History of Present Illness: [name] is a 69 y.o. female who presents for ongoing assessment and management of HOCM, hypertension, hyperlipidemia, history of DVT, OSA, and chronic chest pain. Patient had a negative cardiac catheterization in 2004, also a negative Myoview in February 2018 for atypical chest pain. Coronary CTA was recommended due to her progressive symptoms when last seen in the office in June 2019. Her CT scan revealed a coronary calcium score of 0, normal coronary origin with right dominance, no evidence of CAD.   When see last she had a list of her blood pressures and continued to be elevated. She was not reducing her salt intake.  I increased her hydralazine to 37.5 TID. She does not tolerate higher doses of metoprolol as she had bradycardic.    Past Medical History:  Diagnosis Date  . Bradycardia   . Chest pain   . Constipation   . Depression   . Diverticulitis   . Diverticulosis of colon   . DOE (dyspnea on exertion)   . DVT (deep venous thrombosis) ([place])   . Esophageal stricture   . Hyperlipemia   . Hypertension   . IBS (irritable bowel syndrome)   . Sleep apnea     Past Surgical History:  Procedure Laterality Date  . APPENDECTOMY    . BLOOD CLOT  2004   RIGHT LEG  . CHOLECYSTECTOMY    . COLON SURGERY    . GALLBLADDER SURGERY  2003  . NISSEN FUNDOPLICATION    . PLANTAR FASCIA SURGERY  2007  . sigmoid colectomy  10/21/2010   pelvic anastomosis  . VAGINAL HYSTERECTOMY       Current Outpatient Medications  Medication Sig Dispense Refill  . albuterol (PROVENTIL HFA;VENTOLIN HFA) 108 (90 Base) MCG/ACT inhaler Inhale 2 puffs into the lungs every 6 (six) hours as needed for wheezing or shortness of breath. 1 Inhaler 0  . aspirin 81 MG tablet Take 1 tablet  (81 mg total) by mouth daily.    Marland Kitchen atorvastatin (LIPITOR) 20 MG tablet Take 20 mg by mouth daily.    . Cholecalciferol (VITAMIN D-3 PO) Take 1 capsule by mouth daily.    . citalopram (CELEXA) 20 MG tablet Take 20 mg by mouth daily.    . famotidine (PEPCID) 20 MG tablet Take 20 mg by mouth at bedtime.     . fluticasone (FLONASE) 50 MCG/ACT nasal spray Place 2 sprays into both nostrils as needed.    . hydrALAZINE (APRESOLINE) 25 MG tablet Take 1.5 tablets (37.5 mg total) by mouth 3 (three) times daily. 315 tablet 3  . hydrochlorothiazide (HYDRODIURIL) 25 MG tablet Take 1 tablet (25 mg total) by mouth daily. 30 tablet 3  . LORazepam (ATIVAN) 1 MG tablet Take 1 tablet (1 mg total) by mouth at bedtime as needed for anxiety. (Patient taking differently: Take 0.5 mg by mouth at bedtime as needed for anxiety. ) 15 tablet 0  . metoprolol succinate (TOPROL-XL) 25 MG 24 hr tablet TAKE 1 TABLET BY MOUTH TWICE DAILY FOR 90 DAYS  1  . omeprazole (PRILOSEC) 20 MG capsule Take 20 mg by mouth daily.    Marland Kitchen spironolactone (ALDACTONE) 25 MG tablet Take 1 tablet (25 mg total) by mouth daily. 90 tablet 3   No current facility-administered medications  for this visit.    Allergies:   Dicyclomine, Lisinopril, Losartan, Ace inhibitors, and Penicillins    Social History:  The patient  reports that she has never smoked. She has never used smokeless tobacco. She reports that she does not drink alcohol or use drugs.   Family History:  The patient's family history includes Brain cancer in her father; Breast cancer in her cousin; Colitis in her sister; Colon polyps in her sister; Crohn's disease in her sister; Diabetes in her sister; Heart disease in her mother; Irritable bowel syndrome in her brother.    ROS: All other systems are reviewed and negative. Unless otherwise mentioned in H&P    PHYSICAL EXAM: VS:  There were no vitals taken for this visit. , BMI There is no height or weight on file to calculate BMI. GEN:  Well nourished, well developed, in no acute distress HEENT: normal Neck: no JVD, carotid bruits, or masses Cardiac: ***RRR; no murmurs, rubs, or gallops,no edema  Respiratory:  Clear to auscultation bilaterally, normal work of breathing GI: soft, nontender, nondistended, + BS MS: no deformity or atrophy Skin: warm and dry, no rash Neuro:  Strength and sensation are intact Psych: euthymic mood, full affect   EKG:  EKG {ACTION; IS/IS [id number] ordered today. The ekg ordered today demonstrates ***   Recent Labs: 03/07/2019: ALT 38 03/09/2019: Hemoglobin 9.8; Magnesium 1.5; Platelets 156 04/17/2019: BUN 9; Creatinine, Ser 0.81; Potassium 3.3; Sodium 136    Lipid Panel    Component Value Date/Time   CHOL 224 (H) 07/28/2016 0930   CHOL 209 (H) 11/22/2013 0232   TRIG 162 (H) 07/28/2016 0930   TRIG 99 11/22/2013 0232   HDL 57 07/28/2016 0930   HDL 56 11/22/2013 0232   CHOLHDL 3.9 07/28/2016 0930   VLDL 32 (H) 07/28/2016 0930   VLDL 20 11/22/2013 0232   LDLCALC 135 (H) 07/28/2016 0930   LDLCALC 133 (H) 11/22/2013 0232      Wt Readings from Last 3 Encounters:  04/17/19 152 lb (68.9 kg)  03/29/19 156 lb (70.8 kg)  03/09/19 158 lb 4.8 oz (71.8 kg)      Other studies Reviewed: Echocardiogram 03/11/2019  Left ventricle: The cavity size was normal. There was mild focal basal hypertrophy of the septum. Systolic function was normal. The estimated ejection fraction was in the range of 55% to 60%. Wall motion was normal; there were no regional wall motion abnormalities. Features are consistent with a pseudonormal left ventricular filling pattern, with concomitant abnormal relaxation and increased filling pressure (grade 2 diastolic dysfunction). Doppler parameters are consistent with high ventricular filling pressure. - Aortic valve: Transvalvular velocity was within the normal range. There was no stenosis. There was mild regurgitation. Valve area (VTI):  3.84 cm^2. Valve area (Vmax): 3.23 cm^2. Valve area (Vmean): 3.55 cm^2. - Mitral valve: Transvalvular velocity was within the normal range. There was no evidence for stenosis. There was mild regurgitation. - Left atrium: The atrium was mildly dilated. - Right ventricle: The cavity size was normal. Wall thickness was normal. Systolic function was normal. - Atrial septum: No defect or patent foramen ovale was identified by color flow Doppler. - Tricuspid valve: There was mild regurgitation. - Pulmonary arteries: Systolic pressure was within the normal range. PA peak pressure: 29 mm Hg (S).  Impressions:  - Mild hypertrophy of the basal septum. No left ventricular outflow tract gradient noted.   ASSESSMENT AND PLAN:  1.  ***   Current medicines are reviewed at length with the  patient today.    Labs/ tests ordered today include: *** Phill Myron. West Pugh, ANP, [facility]   06/22/2019 8:33 AM    [facility] [place] [address] Office [phone] Fax [phone]  Notice: This dictation was prepared with Dragon dictation along with smaller phrase technology. Any transcriptional errors that result from this process are unintentional and may not be corrected upon review.

## 2019-06-26 ENCOUNTER — Ambulatory Visit: Payer: PPO | Admitting: Adult Health

## 2019-06-27 NOTE — Progress Notes (Signed)
Cardiology Office Note   Date:  06/28/2019   ID:  [name], DOB 11-25-1950, MRN [id number]  PCP:  Leeroy Cha, MD  Cardiologist: Dr. Debara Pickett  CC: Shortness of breath   History of Present Illness: [name] is a 69 y.o. female who presents for for ongoing assessment and management of HOCM, hypertension, hyperlipidemia, history of DVT, OSA, and chronic chest pain. Patient had a negative cardiac catheterization in 2004, also a negative Myoview in February 2018 for atypical chest pain. Coronary CTA was recommended due to her progressive symptoms when last seen in the office in June 2019. Her CT scan revealed a coronary calcium score of 0, normal coronary origin with right dominance, no evidence of CAD.   [name] was hospitalized with acute gastroenteritis in November 2020 and was also found to have SVT.  She was treated with adenosine and Lopressor.  She returned to normal sinus rhythm.  When seen last on 03/29/2019 she complained of elevated blood pressure.  She also complained of shortness of breath while climbing stairs in her home (only 5 steps) which was unusual for her.  She also continued to complain of rapid rhythm while watching television, or with minimal exertion.  She was last seen in the office on 04/17/2019 for review of BP and symptoms. She admitted to not adhering to low sodium diet. BP was not well controlled and therefore hydralazine was increased to 37.5 TID. She was continued on HCTZ and metoprolol. She is to have fasting lipids and LFT's ordered if she has not had these done recently by PCP.  She comes today feeling well with exception of some shortness of breath when climbing stairs.  She also complains of some numbness and tingling in her feet bilaterally.  She has been medically compliant.  She did state that she got dizzy when she had 3 times daily dosing of hydralazine and is now cut back to twice daily dosing of hydralazine 25 mg.  Past Medical  History:  Diagnosis Date  . Bradycardia   . Chest pain   . Constipation   . Depression   . Diverticulitis   . Diverticulosis of colon   . DOE (dyspnea on exertion)   . DVT (deep venous thrombosis) ([place])   . Esophageal stricture   . Hyperlipemia   . Hypertension   . IBS (irritable bowel syndrome)   . Sleep apnea     Past Surgical History:  Procedure Laterality Date  . APPENDECTOMY    . BLOOD CLOT  2004   RIGHT LEG  . CHOLECYSTECTOMY    . COLON SURGERY    . GALLBLADDER SURGERY  2003  . NISSEN FUNDOPLICATION    . PLANTAR FASCIA SURGERY  2007  . sigmoid colectomy  10/21/2010   pelvic anastomosis  . VAGINAL HYSTERECTOMY       Current Outpatient Medications  Medication Sig Dispense Refill  . albuterol (PROVENTIL HFA;VENTOLIN HFA) 108 (90 Base) MCG/ACT inhaler Inhale 2 puffs into the lungs every 6 (six) hours as needed for wheezing or shortness of breath. 1 Inhaler 0  . aspirin 81 MG tablet Take 1 tablet (81 mg total) by mouth daily.    Marland Kitchen atorvastatin (LIPITOR) 20 MG tablet Take 20 mg by mouth daily.    . Cholecalciferol (VITAMIN D-3 PO) Take 1 capsule by mouth daily.    . citalopram (CELEXA) 20 MG tablet Take 20 mg by mouth daily.    . famotidine (PEPCID) 20 MG tablet Take 20 mg by  mouth at bedtime.     . fluticasone (FLONASE) 50 MCG/ACT nasal spray Place 2 sprays into both nostrils as needed.    . hydrALAZINE (APRESOLINE) 25 MG tablet Take 25 mg by mouth in the morning and at bedtime.    . hydrochlorothiazide (HYDRODIURIL) 25 MG tablet Take 1 tablet (25 mg total) by mouth daily. 30 tablet 3  . LORazepam (ATIVAN) 0.5 MG tablet Take 0.5 mg by mouth every 8 (eight) hours as needed. For anxiety    . metoprolol succinate (TOPROL-XL) 25 MG 24 hr tablet TAKE 1 TABLET BY MOUTH TWICE DAILY FOR 90 DAYS  1  . omeprazole (PRILOSEC) 20 MG capsule Take 20 mg by mouth daily.    Marland Kitchen spironolactone (ALDACTONE) 25 MG tablet Take 1 tablet (25 mg total) by mouth daily. 90 tablet 3   No current  facility-administered medications for this visit.    Allergies:   Dicyclomine, Lisinopril, Losartan, Ace inhibitors, and Penicillins    Social History:  The patient  reports that she has never smoked. She has never used smokeless tobacco. She reports that she does not drink alcohol or use drugs.   Family History:  The patient's family history includes Brain cancer in her father; Breast cancer in her cousin; Colitis in her sister; Colon polyps in her sister; Crohn's disease in her sister; Diabetes in her sister; Heart disease in her mother; Irritable bowel syndrome in her brother.    ROS: All other systems are reviewed and negative. Unless otherwise mentioned in H&P    PHYSICAL EXAM: VS:  BP 123/77   Pulse 72   Ht 5\' 1"  (1.549 m)   Wt 156 lb 6.4 oz (70.9 kg)   SpO2 97%   BMI 29.55 kg/m  , BMI Body mass index is 29.55 kg/m. GEN: Well nourished, well developed, in no acute distress HEENT: normal Neck: no JVD, carotid bruits, or masses Cardiac: RRR; no murmurs, rubs, or gallops,no edema  Respiratory:  Clear to auscultation bilaterally, normal work of breathing GI: soft, nontender, nondistended, + BS MS: no deformity or atrophy Skin: warm and dry, no rash Neuro:  Strength and sensation are intact Psych: euthymic mood, full affect   EKG: Not completed this office visit  Recent Labs: 03/07/2019: ALT 38 03/09/2019: Hemoglobin 9.8; Magnesium 1.5; Platelets 156 04/17/2019: BUN 9; Creatinine, Ser 0.81; Potassium 3.3; Sodium 136    Lipid Panel    Component Value Date/Time   CHOL 224 (H) 07/28/2016 0930   CHOL 209 (H) 11/22/2013 0232   TRIG 162 (H) 07/28/2016 0930   TRIG 99 11/22/2013 0232   HDL 57 07/28/2016 0930   HDL 56 11/22/2013 0232   CHOLHDL 3.9 07/28/2016 0930   VLDL 32 (H) 07/28/2016 0930   VLDL 20 11/22/2013 0232   LDLCALC 135 (H) 07/28/2016 0930   LDLCALC 133 (H) 11/22/2013 0232      Wt Readings from Last 3 Encounters:  06/28/19 156 lb 6.4 oz (70.9 kg)    04/17/19 152 lb (68.9 kg)  03/29/19 156 lb (70.8 kg)      Other studies Reviewed: Echocardiogram 04/04/2019  Left ventricle: The cavity size was normal. There was mild focal basal hypertrophy of the septum. Systolic function was normal. The estimated ejection fraction was in the range of 55% to 60%. Wall motion was normal; there were no regional wall motion abnormalities. Features are consistent with a pseudonormal left ventricular filling pattern, with concomitant abnormal relaxation and increased filling pressure (grade 2 diastolic dysfunction). Doppler parameters are  consistent with high ventricular filling pressure. - Aortic valve: Transvalvular velocity was within the normal range. There was no stenosis. There was mild regurgitation. Valve area (VTI): 3.84 cm^2. Valve area (Vmax): 3.23 cm^2. Valve area (Vmean): 3.55 cm^2. - Mitral valve: Transvalvular velocity was within the normal range. There was no evidence for stenosis. There was mild regurgitation. - Left atrium: The atrium was mildly dilated. - Right ventricle: The cavity size was normal. Wall thickness was normal. Systolic function was normal. - Atrial septum: No defect or patent foramen ovale was identified by color flow Doppler. - Tricuspid valve: There was mild regurgitation. - Pulmonary arteries: Systolic pressure was within the normal range. PA peak pressure: 29 mm Hg (S).  Impressions:  - Mild hypertrophy of the basal septum. No left ventricular outflow tract gradient noted.    ASSESSMENT AND PLAN:  1.  Chronic dyspnea on exertion.  She does have inhalers.  This may be multifactorial due to lack of exercise and asthma.  I have advised her to begin a walking program to give her more stamina, lose weight, and to follow-up with pulmonology if her breathing status is not improved.  2.  Hypertension: Excellent control of blood pressure today.  She has cut back on her  hydralazine to 25 mg twice daily due to dizziness.  She will continue spironolactone, metoprolol, and HCTZ as directed.  3.  Bilateral foot numbness and pain.  I have advised her to get a good pair of walking shoes, see a podiatrist if symptoms worsen.  4.  Anxiety: Follow-up with PCP for ongoing management using Ativan and citalopram.  Counseling if they deem necessary.  5. Hyperlipidemia: Continue atorvastatin as directed.   Current medicines are reviewed at length with the patient today.    Labs/ tests ordered today include: None  Phill Myron. West Pugh, ANP, AACC   06/28/2019 4:40 PM    [facility] [place] [address] Office [phone] Fax [phone]  Notice: This dictation was prepared with Dragon dictation along with smaller phrase technology. Any transcriptional errors that result from this process are unintentional and may not be corrected upon review.

## 2019-06-28 ENCOUNTER — Other Ambulatory Visit: Payer: Self-pay

## 2019-06-28 ENCOUNTER — Ambulatory Visit (INDEPENDENT_AMBULATORY_CARE_PROVIDER_SITE_OTHER): Payer: PPO | Admitting: Adult Health

## 2019-06-28 ENCOUNTER — Encounter: Payer: Self-pay | Admitting: Adult Health

## 2019-06-28 VITALS — BP 123/77 | HR 72 | Ht 61.0 in | Wt 156.4 lb

## 2019-06-28 DIAGNOSIS — E7800 Pure hypercholesterolemia, unspecified: Secondary | ICD-10-CM | POA: Diagnosis not present

## 2019-06-28 DIAGNOSIS — I2510 Atherosclerotic heart disease of native coronary artery without angina pectoris: Secondary | ICD-10-CM

## 2019-06-28 DIAGNOSIS — R202 Paresthesia of skin: Secondary | ICD-10-CM | POA: Diagnosis not present

## 2019-06-28 DIAGNOSIS — R200 Anesthesia of skin: Secondary | ICD-10-CM | POA: Diagnosis not present

## 2019-06-28 DIAGNOSIS — I10 Essential (primary) hypertension: Secondary | ICD-10-CM | POA: Diagnosis not present

## 2019-06-28 NOTE — Patient Instructions (Signed)
Medication Instructions:  Continue current medications  *If you need a refill on your cardiac medications before your next appointment, please call your pharmacy*  Lab Work: None Ordered  Testing/Procedures: None Ordered  Follow-Up: At [facility], you and your health needs are our priority.  As part of our continuing mission to provide you with exceptional heart care, we have created designated Provider Care Teams.  These Care Teams include your primary Cardiologist (physician) and Advanced Practice Providers (APPs -  Physician Assistants and Nurse Practitioners) who all work together to provide you with the care you need, when you need it.  Your next appointment:   6 month(s)  The format for your next appointment:   In Person  Provider:   You may see Pixie Casino, MD or one of the following Advanced Practice Providers on your designated Care Team:    Almyra Deforest, PA-C  Fabian Sharp, PA-C or   Roby Lofts, [place]

## 2019-06-29 DIAGNOSIS — J410 Simple chronic bronchitis: Secondary | ICD-10-CM | POA: Diagnosis not present

## 2019-06-29 DIAGNOSIS — I10 Essential (primary) hypertension: Secondary | ICD-10-CM | POA: Diagnosis not present

## 2019-06-29 DIAGNOSIS — J449 Chronic obstructive pulmonary disease, unspecified: Secondary | ICD-10-CM | POA: Diagnosis not present

## 2019-06-29 DIAGNOSIS — E785 Hyperlipidemia, unspecified: Secondary | ICD-10-CM | POA: Diagnosis not present

## 2019-06-29 DIAGNOSIS — E7800 Pure hypercholesterolemia, unspecified: Secondary | ICD-10-CM | POA: Diagnosis not present

## 2019-07-07 ENCOUNTER — Ambulatory Visit: Payer: PPO

## 2019-08-09 ENCOUNTER — Ambulatory Visit: Payer: PPO

## 2019-09-06 DIAGNOSIS — E785 Hyperlipidemia, unspecified: Secondary | ICD-10-CM | POA: Diagnosis not present

## 2019-09-06 DIAGNOSIS — E7800 Pure hypercholesterolemia, unspecified: Secondary | ICD-10-CM | POA: Diagnosis not present

## 2019-09-06 DIAGNOSIS — J449 Chronic obstructive pulmonary disease, unspecified: Secondary | ICD-10-CM | POA: Diagnosis not present

## 2019-09-06 DIAGNOSIS — I10 Essential (primary) hypertension: Secondary | ICD-10-CM | POA: Diagnosis not present

## 2019-09-06 DIAGNOSIS — J410 Simple chronic bronchitis: Secondary | ICD-10-CM | POA: Diagnosis not present

## 2019-09-25 ENCOUNTER — Other Ambulatory Visit (HOSPITAL_COMMUNITY): Payer: Self-pay | Admitting: Internal Medicine

## 2019-09-25 DIAGNOSIS — I10 Essential (primary) hypertension: Secondary | ICD-10-CM | POA: Diagnosis not present

## 2019-09-25 DIAGNOSIS — F419 Anxiety disorder, unspecified: Secondary | ICD-10-CM | POA: Diagnosis not present

## 2019-09-25 DIAGNOSIS — M79604 Pain in right leg: Secondary | ICD-10-CM | POA: Diagnosis not present

## 2019-09-25 DIAGNOSIS — M79605 Pain in left leg: Secondary | ICD-10-CM

## 2019-09-26 ENCOUNTER — Other Ambulatory Visit (HOSPITAL_COMMUNITY): Payer: Self-pay | Admitting: Internal Medicine

## 2019-09-26 ENCOUNTER — Ambulatory Visit (HOSPITAL_COMMUNITY)
Admission: RE | Admit: 2019-09-26 | Discharge: 2019-09-26 | Disposition: A | Discharge status: Home / Self Care | Payer: PPO | Source: Ambulatory Visit | Attending: Internal Medicine | Admitting: Internal Medicine

## 2019-09-26 ENCOUNTER — Other Ambulatory Visit: Payer: Self-pay

## 2019-09-26 DIAGNOSIS — M79604 Pain in right leg: Secondary | ICD-10-CM | POA: Insufficient documentation

## 2019-09-26 DIAGNOSIS — M79605 Pain in left leg: Secondary | ICD-10-CM | POA: Insufficient documentation

## 2019-09-26 DIAGNOSIS — M7989 Other specified soft tissue disorders: Secondary | ICD-10-CM | POA: Diagnosis not present

## 2019-09-26 NOTE — Progress Notes (Signed)
Right lower extremity venous duplex completed. Refer to "CV Proc" under chart review to view preliminary results.  09/26/2019 10:50 AM Kelby Aline., MHA, RVT, RDCS, RDMS

## 2019-10-05 DIAGNOSIS — E7800 Pure hypercholesterolemia, unspecified: Secondary | ICD-10-CM | POA: Diagnosis not present

## 2019-10-05 DIAGNOSIS — J449 Chronic obstructive pulmonary disease, unspecified: Secondary | ICD-10-CM | POA: Diagnosis not present

## 2019-10-05 DIAGNOSIS — E785 Hyperlipidemia, unspecified: Secondary | ICD-10-CM | POA: Diagnosis not present

## 2019-10-05 DIAGNOSIS — I10 Essential (primary) hypertension: Secondary | ICD-10-CM | POA: Diagnosis not present

## 2019-10-05 DIAGNOSIS — J410 Simple chronic bronchitis: Secondary | ICD-10-CM | POA: Diagnosis not present

## 2019-10-09 DIAGNOSIS — I10 Essential (primary) hypertension: Secondary | ICD-10-CM | POA: Diagnosis not present

## 2019-10-30 DIAGNOSIS — J410 Simple chronic bronchitis: Secondary | ICD-10-CM | POA: Diagnosis not present

## 2019-10-30 DIAGNOSIS — I10 Essential (primary) hypertension: Secondary | ICD-10-CM | POA: Diagnosis not present

## 2019-10-30 DIAGNOSIS — J449 Chronic obstructive pulmonary disease, unspecified: Secondary | ICD-10-CM | POA: Diagnosis not present

## 2019-10-30 DIAGNOSIS — E7800 Pure hypercholesterolemia, unspecified: Secondary | ICD-10-CM | POA: Diagnosis not present

## 2019-10-30 DIAGNOSIS — E785 Hyperlipidemia, unspecified: Secondary | ICD-10-CM | POA: Diagnosis not present

## 2019-11-08 DIAGNOSIS — I10 Essential (primary) hypertension: Secondary | ICD-10-CM | POA: Diagnosis not present

## 2019-11-20 DIAGNOSIS — K219 Gastro-esophageal reflux disease without esophagitis: Secondary | ICD-10-CM | POA: Diagnosis not present

## 2019-11-20 DIAGNOSIS — J449 Chronic obstructive pulmonary disease, unspecified: Secondary | ICD-10-CM | POA: Diagnosis not present

## 2019-11-20 DIAGNOSIS — I10 Essential (primary) hypertension: Secondary | ICD-10-CM | POA: Diagnosis not present

## 2019-11-20 DIAGNOSIS — J302 Other seasonal allergic rhinitis: Secondary | ICD-10-CM | POA: Diagnosis not present

## 2019-11-20 DIAGNOSIS — Z0000 Encounter for general adult medical examination without abnormal findings: Secondary | ICD-10-CM | POA: Diagnosis not present

## 2019-11-20 DIAGNOSIS — G4733 Obstructive sleep apnea (adult) (pediatric): Secondary | ICD-10-CM | POA: Diagnosis not present

## 2019-11-20 DIAGNOSIS — Z1389 Encounter for screening for other disorder: Secondary | ICD-10-CM | POA: Diagnosis not present

## 2019-11-20 DIAGNOSIS — E785 Hyperlipidemia, unspecified: Secondary | ICD-10-CM | POA: Diagnosis not present

## 2019-12-03 DIAGNOSIS — I10 Essential (primary) hypertension: Secondary | ICD-10-CM | POA: Diagnosis not present

## 2019-12-06 DIAGNOSIS — I10 Essential (primary) hypertension: Secondary | ICD-10-CM | POA: Diagnosis not present

## 2019-12-06 DIAGNOSIS — J410 Simple chronic bronchitis: Secondary | ICD-10-CM | POA: Diagnosis not present

## 2019-12-06 DIAGNOSIS — E785 Hyperlipidemia, unspecified: Secondary | ICD-10-CM | POA: Diagnosis not present

## 2019-12-06 DIAGNOSIS — E7800 Pure hypercholesterolemia, unspecified: Secondary | ICD-10-CM | POA: Diagnosis not present

## 2019-12-06 DIAGNOSIS — J449 Chronic obstructive pulmonary disease, unspecified: Secondary | ICD-10-CM | POA: Diagnosis not present

## 2020-01-05 DIAGNOSIS — J449 Chronic obstructive pulmonary disease, unspecified: Secondary | ICD-10-CM | POA: Diagnosis not present

## 2020-01-05 DIAGNOSIS — I10 Essential (primary) hypertension: Secondary | ICD-10-CM | POA: Diagnosis not present

## 2020-01-05 DIAGNOSIS — K219 Gastro-esophageal reflux disease without esophagitis: Secondary | ICD-10-CM | POA: Diagnosis not present

## 2020-01-05 DIAGNOSIS — E7800 Pure hypercholesterolemia, unspecified: Secondary | ICD-10-CM | POA: Diagnosis not present

## 2020-01-05 DIAGNOSIS — E785 Hyperlipidemia, unspecified: Secondary | ICD-10-CM | POA: Diagnosis not present

## 2020-01-05 DIAGNOSIS — J410 Simple chronic bronchitis: Secondary | ICD-10-CM | POA: Diagnosis not present

## 2020-01-09 DIAGNOSIS — I10 Essential (primary) hypertension: Secondary | ICD-10-CM | POA: Diagnosis not present

## 2020-01-27 DIAGNOSIS — J40 Bronchitis, not specified as acute or chronic: Secondary | ICD-10-CM | POA: Diagnosis not present

## 2020-01-27 DIAGNOSIS — J029 Acute pharyngitis, unspecified: Secondary | ICD-10-CM | POA: Diagnosis not present

## 2020-02-08 DIAGNOSIS — I10 Essential (primary) hypertension: Secondary | ICD-10-CM | POA: Diagnosis not present

## 2020-02-21 ENCOUNTER — Ambulatory Visit (INDEPENDENT_AMBULATORY_CARE_PROVIDER_SITE_OTHER): Payer: PPO | Admitting: Internal Medicine

## 2020-02-21 ENCOUNTER — Encounter: Payer: Self-pay | Admitting: Internal Medicine

## 2020-02-21 ENCOUNTER — Other Ambulatory Visit: Payer: Self-pay

## 2020-02-21 VITALS — BP 122/60 | HR 59 | Temp 97.5°F | Ht 61.0 in | Wt 152.0 lb

## 2020-02-21 DIAGNOSIS — I10 Essential (primary) hypertension: Secondary | ICD-10-CM | POA: Diagnosis not present

## 2020-02-21 DIAGNOSIS — R0789 Other chest pain: Secondary | ICD-10-CM

## 2020-02-21 DIAGNOSIS — R200 Anesthesia of skin: Secondary | ICD-10-CM

## 2020-02-21 DIAGNOSIS — R072 Precordial pain: Secondary | ICD-10-CM

## 2020-02-21 DIAGNOSIS — Z01812 Encounter for preprocedural laboratory examination: Secondary | ICD-10-CM | POA: Diagnosis not present

## 2020-02-21 DIAGNOSIS — E7800 Pure hypercholesterolemia, unspecified: Secondary | ICD-10-CM | POA: Diagnosis not present

## 2020-02-21 DIAGNOSIS — R209 Unspecified disturbances of skin sensation: Secondary | ICD-10-CM

## 2020-02-21 NOTE — Progress Notes (Signed)
OFFICE NOTE  Chief Complaint:  Follow-up bradycardia  Primary Care Physician: Leeroy Cha, MD  HPI:  [name] is a 69 year old female who recently lost her insurance and has not followed up with her primary care provider, Dr. Emily Filbert in [place]. She recently presented to [facility] with left arm heaviness and numbness and chest discomfort. She was diagnosed with complex migraine. There was no specific headache during this episode she does not have a headache history. On September 9 she felt like she was having a heart attack. She had some heaviness in her chest and shortness of breath. EKG showed no significant changes. Apparently she had had a stress test at [facility] in 2015 which was negative for ischemia. She does have a history of sleep apnea in the past. She also has anxiety but is not currently taking medicine for that and hypertension. She reports being close to running out of her medications and does not have a primary care physician.  07/28/2016  [name] returns today for follow-up. She was recently in the hospital was noted to be bradycardic with atypical chest pain. Was thought to be reflux and she was started on PPI. She still feels tightness and heaviness in her chest. She underwent a nuclear stress test which was low risk with normal LV systolic function. She ruled out for MI by negative troponins. She did have a heart catheterization in 2004 and brought the images with her today. This showed essentially normal coronaries. An echocardiogram was performed due to a systolic murmur and she was found to have severe basal septal hypertrophy probably consistent with hypertrophic cardiomyopathy. Is not clear if she is more symptomatically with exertion or not. The outflow tract gradient was not measured and Valsalva was not performed. She does have some lability to her blood pressure is noted that she is symptomatic when her heart rate is in  the 50s feeling worse than she does when it's higher, although this is somewhat counterintuitive as the goal is to lower heart rate with hypertrophic cardiomyopathy. There is no mention of rest obstruction on her echo. She did report that her mom died with congestive heart failure may have had structural heart disease. She is also reporting some numbness and tingling in her feet although has good distal pulses and warm extremities.  02/21/2020  [name] is seen today in follow-up.  I last saw her in 2018 but more recently she saw Beckie Busing, NP.  She has been describing chest pressure with exertion.  This is central and not associated with wheezing but does have some shortness of breath.  It was initially thought this might be due to her asthma and sounded somewhat atypical although she has a strong family history of heart disease in multiple relatives.  Fortunately, she had CT coronary angiography in 2019 which showed no calcium and normal coronaries.  Despite this she feels like she has had progressive disease.  Additionally, she is complaining of numbness and tingling of her extremities however had work-up for neuropathy which was negative.  She had ABIs in 2019 which showed a mildly reduced TBI in the left but otherwise normal triphasic waveforms.  PMHx:  Past Medical History:  Diagnosis Date  . Bradycardia   . Chest pain   . Constipation   . Depression   . Diverticulitis   . Diverticulosis of colon   . DOE (dyspnea on exertion)   . DVT (deep venous thrombosis) ([place])   .  Esophageal stricture   . Hyperlipemia   . Hypertension   . IBS (irritable bowel syndrome)   . Sleep apnea     Past Surgical History:  Procedure Laterality Date  . APPENDECTOMY    . BLOOD CLOT  2004   RIGHT LEG  . CHOLECYSTECTOMY    . COLON SURGERY    . GALLBLADDER SURGERY  2003  . NISSEN FUNDOPLICATION    . PLANTAR FASCIA SURGERY  2007  . sigmoid colectomy  10/21/2010   pelvic anastomosis  . VAGINAL  HYSTERECTOMY      FAMHx:  Family History  Problem Relation Age of Onset  . Heart disease Mother   . Crohn's disease Sister   . Colon polyps Sister   . Irritable bowel syndrome Brother   . Brain cancer Father   . Colitis Sister   . Diabetes Sister   . Breast cancer Cousin   . Colon cancer Neg Hx     SOCHx:   reports that she has never smoked. She has never used smokeless tobacco. She reports that she does not drink alcohol and does not use drugs.  ALLERGIES:  Allergies  Allergen Reactions  . Dicyclomine Other (See Comments)    REACTION: Choking  . Lisinopril Other (See Comments)    REACTION: Choking  . Losartan Shortness Of Breath  . Ace Inhibitors Other (See Comments)    Unknown to patient  . Penicillins Itching and Rash    ROS: Pertinent items noted in HPI and remainder of comprehensive ROS otherwise negative.  HOME MEDS: Current Outpatient Medications  Medication Sig Dispense Refill  . albuterol (PROVENTIL HFA;VENTOLIN HFA) 108 (90 Base) MCG/ACT inhaler Inhale 2 puffs into the lungs every 6 (six) hours as needed for wheezing or shortness of breath. 1 Inhaler 0  . aspirin 81 MG tablet Take 1 tablet (81 mg total) by mouth daily.    Marland Kitchen atorvastatin (LIPITOR) 20 MG tablet Take 20 mg by mouth daily.    . citalopram (CELEXA) 20 MG tablet Take 10 mg by mouth daily.     . famotidine (PEPCID) 20 MG tablet Take 20 mg by mouth at bedtime.     . fluticasone (FLONASE) 50 MCG/ACT nasal spray Place 2 sprays into both nostrils as needed.    . hydrALAZINE (APRESOLINE) 25 MG tablet Take 25 mg by mouth in the morning and at bedtime.    . hydrochlorothiazide (HYDRODIURIL) 25 MG tablet Take 1 tablet (25 mg total) by mouth daily. 30 tablet 3  . LORazepam (ATIVAN) 0.5 MG tablet Take 0.5 mg by mouth every 8 (eight) hours as needed. For anxiety    . metoprolol succinate (TOPROL-XL) 25 MG 24 hr tablet TAKE 1 TABLET BY MOUTH TWICE DAILY FOR 90 DAYS  1  . omeprazole (PRILOSEC) 20 MG capsule  Take 20 mg by mouth daily.    Marland Kitchen spironolactone (ALDACTONE) 25 MG tablet Take 1 tablet (25 mg total) by mouth daily. 90 tablet 3   No current facility-administered medications for this visit.    LABS/IMAGING: No results found for this or any previous visit (from the past 48 hour(s)). No results found.  WEIGHTS: Wt Readings from Last 3 Encounters:  02/21/20 152 lb (68.9 kg)  06/28/19 156 lb 6.4 oz (70.9 kg)  04/17/19 152 lb (68.9 kg)    VITALS: BP 122/60 (BP Location: Left Arm, Patient Position: Sitting, Cuff Size: Normal)   Pulse (!) 59   Temp (!) 97.5 F (36.4 C)   Ht 5\' 1"  (1.549  m)   Wt 152 lb (68.9 kg)   BMI 28.72 kg/m   EXAM: General appearance: alert and no distress Neck: no carotid bruit and no JVD Lungs: clear to auscultation bilaterally Heart: regular rate and rhythm, S1, S2 normal, no murmur, click, rub or gallop Abdomen: soft, non-tender; bowel sounds normal; no masses,  no organomegaly Extremities: extremities normal, atraumatic, no cyanosis or edema Pulses: 2+ and symmetric Skin: Skin color, texture, turgor normal. No rashes or lesions Neurologic: Grossly normal Psych: Pleasant  EKG: Sinus bradycardia 59-personally reviewed  ASSESSMENT: 1. Exertional chest pressure 2. Atypical chest pain - negative Myoview 06/2016-LVEF 77% 3. Low risk coronary CTA - zero CAC, no CAD (2019) 4. Hypertension-controlled 5. Anxiety 6. Toe numbness- suspect peripheral neuropathy  PLAN: 1.   [name] is describing exertional chest pressure.  It has some typical and atypical components however I am somewhat reassured by her normal coronary CT angiogram with 0 coronary calcium in 2019.  Despite this she feels like she has had progressive symptoms and that her symptoms now are much different than they were previously.  She also has a strong family history of multiple relatives with coronary disease.  Will plan a repeat CT coronary angiogram and if there is no significant change  then she should have further work-up for noncardiac chest pain.  We will also repeat ABIs but no Dopplers and if there is some evidence of reduced ABIs she may need further evaluation.  She does appear to have decent posterior tibial pulses however her dorsalis pedis pulses are difficult to palpate.  Toes are cold to the touch.  Follow-up with me in 1 month.  Pixie Casino, MD, [facility], [place] Director of the Advanced Lipid Disorders &  Cardiovascular Risk Reduction Clinic Diplomate of the American Board of Clinical Lipidology Attending Cardiologist  Direct Dial: [phone]  Fax: [phone]  Website:  [email or web address] 02/21/2020, 3:15 PM

## 2020-02-21 NOTE — Patient Instructions (Signed)
Medication Instructions:  Continue current medications  *If you need a refill on your cardiac medications before your next appointment, please call your pharmacy*   Lab Work: BMET to be done prior to CT test If you have labs (blood work) drawn today and your tests are completely normal, you will receive your results only by: Marland Kitchen MyChart Message (if you have MyChart) OR . A paper copy in the mail If you have any lab test that is abnormal or we need to change your treatment, we will call you to review the results.   Testing/Procedures: Leg Doppler (ABI) Doppler @ Dr. Lysbeth Penner office  Coronary CT at [place]: At [facility], you and your health needs are our priority.  As part of our continuing mission to provide you with exceptional heart care, we have created designated Provider Care Teams.  These Care Teams include your primary Cardiologist (physician) and Advanced Practice Providers (APPs -  Physician Assistants and Nurse Practitioners) who all work together to provide you with the care you need, when you need it.  We recommend signing up for the patient portal called "MyChart".  Sign up information is provided on this After Visit Summary.  MyChart is used to connect with patients for Virtual Visits (Telemedicine).  Patients are able to view lab/test results, encounter notes, upcoming appointments, etc.  Non-urgent messages can be sent to your provider as well.   To learn more about what you can do with MyChart, go to [email or web address].    Your next appointment:   4-6 week(s)  The format for your next appointment:   In Person  Provider:   You may see Pixie Casino, MD or one of the following Advanced Practice Providers on your designated Care Team:    Almyra Deforest, PA-C  Fabian Sharp, PA-C or   Roby Lofts, [place]    Other Instructions  Your cardiac CT will be scheduled at one of the below locations:   [facility] [address] [place], [place] [address] [phone]  [place] [address] [place], [place] [address] [phone]  If scheduled at [facility], please arrive at the [facility] main entrance of [facility] 30 minutes prior to test start time. Proceed to the [facility] Radiology Department (first floor) to check-in and test prep.  If scheduled at [facility], please arrive 15 mins early for check-in and test prep.  Please follow these instructions carefully (unless otherwise directed):  Hold all erectile dysfunction medications at least 3 days (72 hrs) prior to test.  On the Night Before the Test: . Be sure to Drink plenty of water. . Do not consume any caffeinated/decaffeinated beverages or chocolate 12 hours prior to your test. . Do not take any antihistamines 12 hours prior to your test.  On the Day of the Test: . Drink plenty of water. Do not drink any water within one hour of the test. . Do not eat any food 4 hours prior to the test. . You may take your regular medications prior to the test.  . HOLD Furosemide/Hydrochlorothiazide morning of the test. . FEMALES- please wear underwire-free bra if available  After the Test: . Drink plenty of water. . After receiving IV contrast, you may experience a mild flushed feeling. This is normal. . On occasion, you may experience a mild rash up to 24 hours after the test. This is not dangerous.  If this occurs, you can take Benadryl 25 mg and increase your fluid intake. . If you experience trouble breathing, this can be serious. If it is severe call 911 IMMEDIATELY. If it is mild, please call our office. . If you take any of these medications: Glipizide/Metformin, Avandament, Glucavance, please do not take 48 hours after completing test unless otherwise instructed.   Once we have confirmed authorization from your insurance company, we will call you  to set up a date and time for your test. Based on how quickly your insurance processes prior authorizations requests, please allow up to 4 weeks to be contacted for scheduling your Cardiac CT appointment. Be advised that routine Cardiac CT appointments could be scheduled as many as 8 weeks after your provider has ordered it.  For non-scheduling related questions, please contact the cardiac imaging nurse navigator should you have any questions/concerns: Marchia Bond, Cardiac Imaging Nurse Navigator Burley Saver, Interim Cardiac Imaging Nurse [place] and Vascular Services Direct Office Dial: [phone]   For scheduling needs, including cancellations and rescheduling, please call Vivien Rota at [phone], option 3.

## 2020-02-23 ENCOUNTER — Other Ambulatory Visit: Payer: Self-pay | Admitting: Internal Medicine

## 2020-02-23 DIAGNOSIS — R200 Anesthesia of skin: Secondary | ICD-10-CM

## 2020-02-23 DIAGNOSIS — R209 Unspecified disturbances of skin sensation: Secondary | ICD-10-CM

## 2020-02-26 ENCOUNTER — Encounter (HOSPITAL_COMMUNITY): Payer: PPO

## 2020-02-27 DIAGNOSIS — Z01812 Encounter for preprocedural laboratory examination: Secondary | ICD-10-CM | POA: Diagnosis not present

## 2020-02-28 ENCOUNTER — Telehealth: Payer: Self-pay | Admitting: Adult Health

## 2020-02-28 ENCOUNTER — Other Ambulatory Visit: Payer: Self-pay | Admitting: Internal Medicine

## 2020-02-28 ENCOUNTER — Other Ambulatory Visit: Payer: Self-pay | Admitting: *Deleted

## 2020-02-28 DIAGNOSIS — Z79899 Other long term (current) drug therapy: Secondary | ICD-10-CM

## 2020-02-28 DIAGNOSIS — E871 Hypo-osmolality and hyponatremia: Secondary | ICD-10-CM

## 2020-02-28 LAB — BASIC METABOLIC PANEL
BUN/Creatinine Ratio: 16 (ref 12–28)
BUN: 14 mg/dL (ref 8–27)
CO2: 22 mmol/L (ref 20–29)
Calcium: 9.5 mg/dL (ref 8.7–10.3)
Chloride: 91 mmol/L — ABNORMAL LOW (ref 96–106)
Creatinine, Ser: 0.86 mg/dL (ref 0.57–1.00)
GFR calc Af Amer: 80 mL/min/{1.73_m2} (ref >59)
GFR calc non Af Amer: 70 mL/min/{1.73_m2} (ref >59)
Glucose: 144 mg/dL — ABNORMAL HIGH (ref 65–99)
Potassium: 4.2 mmol/L (ref 3.5–5.2)
Sodium: 128 mmol/L — ABNORMAL LOW (ref 134–144)

## 2020-02-28 NOTE — Telephone Encounter (Signed)
Per Kerin Ransom RN with CT at [facility], BMET can likely be done same day as coronary CT w/FFR. Order placed. She will make note on her end as well

## 2020-02-28 NOTE — Telephone Encounter (Signed)
Patient called and stated that she was returning [name] phone call. Please call back

## 2020-02-28 NOTE — Telephone Encounter (Signed)
02/26/20 11:26 AM  Hello [name] -   I saw your CT is scheduled for 03/04/20. Please complete a non-fasting lab test (BMET) before your CT test. This can be done at any [facility], including the one in Dr. Lysbeth Penner office.   Please reach out via MyChart or call [phone] if you have any questions/concerns.   Thank you,  Eliezer Lofts, RN  Informed pt of message above. She states that she does not use Mychart. Her CT is scheduled for 10/29 and she had a BMET yesterday(looks fine). Pt thanks [place] for the call.

## 2020-02-28 NOTE — Telephone Encounter (Signed)
Spoke with patient about lab results:  Hilty, Nadean Corwin, MD  Fidel Levy, RN Labs ok for CT - potassium is better, but sodium and chloride are low - would stop HCTZ, continue spironolactone. Repeat BMET in 1 week.   Dr Leta Baptist will see if she can have BMET done same day as CT on 10/29

## 2020-02-29 NOTE — Telephone Encounter (Signed)
Patient aware BMET can most likely be drawn same day as CT test at [facility] next week

## 2020-03-04 ENCOUNTER — Ambulatory Visit: Payer: PPO

## 2020-03-06 DIAGNOSIS — R739 Hyperglycemia, unspecified: Secondary | ICD-10-CM | POA: Diagnosis not present

## 2020-03-06 DIAGNOSIS — F330 Major depressive disorder, recurrent, mild: Secondary | ICD-10-CM | POA: Diagnosis not present

## 2020-03-06 DIAGNOSIS — E538 Deficiency of other specified B group vitamins: Secondary | ICD-10-CM | POA: Diagnosis not present

## 2020-03-06 DIAGNOSIS — E782 Mixed hyperlipidemia: Secondary | ICD-10-CM | POA: Diagnosis not present

## 2020-03-06 DIAGNOSIS — J4540 Moderate persistent asthma, uncomplicated: Secondary | ICD-10-CM | POA: Diagnosis present

## 2020-03-06 DIAGNOSIS — Z0000 Encounter for general adult medical examination without abnormal findings: Secondary | ICD-10-CM | POA: Diagnosis not present

## 2020-03-07 ENCOUNTER — Telehealth (HOSPITAL_COMMUNITY): Payer: Self-pay | Admitting: Emergency Medicine

## 2020-03-07 DIAGNOSIS — I10 Essential (primary) hypertension: Secondary | ICD-10-CM | POA: Diagnosis not present

## 2020-03-07 NOTE — Telephone Encounter (Signed)
Reaching out to patient to offer assistance regarding upcoming cardiac imaging study; pt verbalizes understanding of appt date/time, parking situation and where to check in, pre-test NPO status and medications ordered, and verified current allergies; name and call back number provided for further questions should they arise Melissa Bond RN Navigator Cardiac Imaging [place] and Vascular [phone] office [phone] cell   Pt verbalized understanding to avoid use of her inhaler and holding diuretics the AM of test. [name]

## 2020-03-08 ENCOUNTER — Other Ambulatory Visit: Payer: Self-pay

## 2020-03-08 ENCOUNTER — Ambulatory Visit (HOSPITAL_COMMUNITY)
Admission: RE | Admit: 2020-03-08 | Discharge: 2020-03-08 | Disposition: A | Discharge status: Home / Self Care | Payer: PPO | Source: Ambulatory Visit | Attending: Internal Medicine | Admitting: Internal Medicine

## 2020-03-08 ENCOUNTER — Encounter: Payer: Self-pay | Admitting: *Deleted

## 2020-03-08 DIAGNOSIS — R072 Precordial pain: Secondary | ICD-10-CM | POA: Diagnosis not present

## 2020-03-08 DIAGNOSIS — R0789 Other chest pain: Secondary | ICD-10-CM | POA: Diagnosis not present

## 2020-03-08 DIAGNOSIS — Z006 Encounter for examination for normal comparison and control in clinical research program: Secondary | ICD-10-CM

## 2020-03-08 DIAGNOSIS — I10 Essential (primary) hypertension: Secondary | ICD-10-CM | POA: Diagnosis not present

## 2020-03-08 MED ORDER — IOHEXOL 350 MG/ML SOLN
100.0000 mL | Freq: Once | INTRAVENOUS | Status: AC | PRN
  Administered 2020-03-08: 100 mL via INTRAVENOUS

## 2020-03-08 MED ORDER — NITROGLYCERIN 0.4 MG SL SUBL
0.8000 mg | SUBLINGUAL_TABLET | Freq: Once | SUBLINGUAL | Status: AC
  Administered 2020-03-08: 0.8 mg via SUBLINGUAL

## 2020-03-08 MED ORDER — NITROGLYCERIN 0.4 MG SL SUBL
SUBLINGUAL_TABLET | SUBLINGUAL | Status: AC
  Filled 2020-03-08: qty 2

## 2020-03-08 NOTE — Research (Signed)
IDENTIFY  Informed Consent                  Subject Name:   [name]   Subject met inclusion and exclusion criteria.  The informed consent form, study requirements and expectations were reviewed with the subject and questions and concerns were addressed prior to the signing of the consent form.  The subject verbalized understanding of the trial requirements.  The subject agreed to participate in the IDENTIFY  trial and signed the informed consent.  The informed consent was obtained prior to performance of any protocol-specific procedures for the subject.  A copy of the signed informed consent was given to the subject and a copy was placed in the subject's medical record.   [place] Lateia Fraser, Research Assistant  03/08/2020 08:05 a.m.

## 2020-03-11 ENCOUNTER — Telehealth: Payer: Self-pay | Admitting: Internal Medicine

## 2020-03-11 NOTE — Telephone Encounter (Signed)
I am acting in triage role today to assist with patient callbacks.   Patient called office requesting copy of coronary CTA be routed to primary care provider Emily Filbert. She gave verbal permission to do so, identity confirmed 2 ways. Result routed as requested. Patient appreciative of callback.  Kesleigh Morson PA-C

## 2020-03-11 NOTE — Telephone Encounter (Signed)
° ° °  Pt is requesting to send copy of her CT cardiac result to her pcp Dr. Sabra Heck

## 2020-03-12 ENCOUNTER — Ambulatory Visit (HOSPITAL_COMMUNITY)
Admission: RE | Admit: 2020-03-12 | Discharge: 2020-03-12 | Disposition: A | Discharge status: Home / Self Care | Payer: PPO | Source: Ambulatory Visit | Attending: Cardiovascular Disease | Admitting: Cardiovascular Disease

## 2020-03-12 ENCOUNTER — Other Ambulatory Visit: Payer: Self-pay

## 2020-03-12 DIAGNOSIS — R200 Anesthesia of skin: Secondary | ICD-10-CM | POA: Diagnosis not present

## 2020-03-12 DIAGNOSIS — R209 Unspecified disturbances of skin sensation: Secondary | ICD-10-CM

## 2020-04-08 ENCOUNTER — Ambulatory Visit: Payer: PPO | Admitting: Internal Medicine

## 2020-04-10 ENCOUNTER — Ambulatory Visit: Payer: PPO

## 2020-04-17 ENCOUNTER — Ambulatory Visit: Payer: PPO | Admitting: Internal Medicine

## 2020-05-20 DIAGNOSIS — J4540 Moderate persistent asthma, uncomplicated: Secondary | ICD-10-CM | POA: Diagnosis not present

## 2020-05-20 DIAGNOSIS — U071 COVID-19: Secondary | ICD-10-CM | POA: Diagnosis not present

## 2020-05-20 DIAGNOSIS — J208 Acute bronchitis due to other specified organisms: Secondary | ICD-10-CM | POA: Diagnosis not present

## 2020-05-23 DIAGNOSIS — J40 Bronchitis, not specified as acute or chronic: Secondary | ICD-10-CM | POA: Diagnosis not present

## 2020-05-23 DIAGNOSIS — J4540 Moderate persistent asthma, uncomplicated: Secondary | ICD-10-CM | POA: Diagnosis not present

## 2020-05-23 DIAGNOSIS — R059 Cough, unspecified: Secondary | ICD-10-CM | POA: Diagnosis not present

## 2020-05-23 DIAGNOSIS — J45902 Unspecified asthma with status asthmaticus: Secondary | ICD-10-CM | POA: Diagnosis not present

## 2020-05-23 DIAGNOSIS — J9811 Atelectasis: Secondary | ICD-10-CM | POA: Diagnosis not present

## 2020-06-10 DIAGNOSIS — J208 Acute bronchitis due to other specified organisms: Secondary | ICD-10-CM | POA: Diagnosis not present

## 2020-06-10 DIAGNOSIS — J4540 Moderate persistent asthma, uncomplicated: Secondary | ICD-10-CM | POA: Diagnosis not present

## 2020-06-10 DIAGNOSIS — J209 Acute bronchitis, unspecified: Secondary | ICD-10-CM | POA: Diagnosis not present

## 2020-06-10 DIAGNOSIS — U071 COVID-19: Secondary | ICD-10-CM | POA: Diagnosis not present

## 2020-06-10 DIAGNOSIS — R059 Cough, unspecified: Secondary | ICD-10-CM | POA: Diagnosis not present

## 2020-06-13 ENCOUNTER — Encounter: Payer: Self-pay | Admitting: *Deleted

## 2020-06-13 NOTE — Research (Unsigned)
I called patient for 90 day phone call for CAD-Fem study. Patient cardiac symptoms have improved. Patient is sick with Covid at this time and is having respiratory symptoms at this time. She thought she was improving from Covid.  I thanked patient for participation in the study and will call patient in one year.

## 2020-06-25 DIAGNOSIS — U071 COVID-19: Secondary | ICD-10-CM | POA: Diagnosis not present

## 2020-06-25 DIAGNOSIS — J1282 Pneumonia due to coronavirus disease 2019: Secondary | ICD-10-CM | POA: Diagnosis not present

## 2020-06-25 DIAGNOSIS — J189 Pneumonia, unspecified organism: Secondary | ICD-10-CM | POA: Diagnosis not present

## 2020-07-16 DIAGNOSIS — I10 Essential (primary) hypertension: Secondary | ICD-10-CM | POA: Diagnosis not present

## 2020-07-16 DIAGNOSIS — F330 Major depressive disorder, recurrent, mild: Secondary | ICD-10-CM | POA: Diagnosis not present

## 2020-07-16 DIAGNOSIS — U071 COVID-19: Secondary | ICD-10-CM | POA: Diagnosis not present

## 2020-07-16 DIAGNOSIS — J1282 Pneumonia due to coronavirus disease 2019: Secondary | ICD-10-CM | POA: Diagnosis not present

## 2020-07-16 DIAGNOSIS — J4540 Moderate persistent asthma, uncomplicated: Secondary | ICD-10-CM | POA: Diagnosis not present

## 2020-07-25 ENCOUNTER — Other Ambulatory Visit: Payer: Self-pay | Admitting: Obstetrics & Gynecology

## 2020-07-25 DIAGNOSIS — Z124 Encounter for screening for malignant neoplasm of cervix: Secondary | ICD-10-CM | POA: Diagnosis not present

## 2020-07-25 DIAGNOSIS — Z1231 Encounter for screening mammogram for malignant neoplasm of breast: Secondary | ICD-10-CM | POA: Diagnosis not present

## 2020-07-25 DIAGNOSIS — Z1151 Encounter for screening for human papillomavirus (HPV): Secondary | ICD-10-CM | POA: Diagnosis not present

## 2020-07-25 DIAGNOSIS — Z1211 Encounter for screening for malignant neoplasm of colon: Secondary | ICD-10-CM | POA: Diagnosis not present

## 2020-07-25 DIAGNOSIS — R87615 Unsatisfactory cytologic smear of cervix: Secondary | ICD-10-CM | POA: Diagnosis not present

## 2020-07-25 DIAGNOSIS — Z01419 Encounter for gynecological examination (general) (routine) without abnormal findings: Secondary | ICD-10-CM | POA: Diagnosis not present

## 2020-08-08 ENCOUNTER — Observation Stay
Admission: EM | Admit: 2020-08-08 | Discharge: 2020-08-09 | Disposition: A | Discharge status: Home / Self Care | Payer: PPO | Attending: Internal Medicine | Admitting: Internal Medicine

## 2020-08-08 ENCOUNTER — Emergency Department: Payer: PPO

## 2020-08-08 ENCOUNTER — Other Ambulatory Visit: Payer: Self-pay

## 2020-08-08 DIAGNOSIS — K209 Esophagitis, unspecified without bleeding: Secondary | ICD-10-CM | POA: Diagnosis not present

## 2020-08-08 DIAGNOSIS — Z79899 Other long term (current) drug therapy: Secondary | ICD-10-CM | POA: Insufficient documentation

## 2020-08-08 DIAGNOSIS — A0819 Acute gastroenteropathy due to other small round viruses: Secondary | ICD-10-CM | POA: Diagnosis not present

## 2020-08-08 DIAGNOSIS — A0811 Acute gastroenteropathy due to Norwalk agent: Secondary | ICD-10-CM

## 2020-08-08 DIAGNOSIS — R197 Diarrhea, unspecified: Secondary | ICD-10-CM

## 2020-08-08 DIAGNOSIS — F419 Anxiety disorder, unspecified: Secondary | ICD-10-CM | POA: Diagnosis not present

## 2020-08-08 DIAGNOSIS — R112 Nausea with vomiting, unspecified: Secondary | ICD-10-CM

## 2020-08-08 DIAGNOSIS — R110 Nausea: Secondary | ICD-10-CM | POA: Diagnosis not present

## 2020-08-08 DIAGNOSIS — K219 Gastro-esophageal reflux disease without esophagitis: Secondary | ICD-10-CM

## 2020-08-08 DIAGNOSIS — Z20822 Contact with and (suspected) exposure to covid-19: Secondary | ICD-10-CM | POA: Diagnosis not present

## 2020-08-08 DIAGNOSIS — F4321 Adjustment disorder with depressed mood: Secondary | ICD-10-CM

## 2020-08-08 DIAGNOSIS — R1110 Vomiting, unspecified: Secondary | ICD-10-CM | POA: Diagnosis not present

## 2020-08-08 DIAGNOSIS — R1111 Vomiting without nausea: Secondary | ICD-10-CM | POA: Diagnosis not present

## 2020-08-08 DIAGNOSIS — E785 Hyperlipidemia, unspecified: Secondary | ICD-10-CM

## 2020-08-08 DIAGNOSIS — I10 Essential (primary) hypertension: Secondary | ICD-10-CM | POA: Diagnosis present

## 2020-08-08 DIAGNOSIS — R1032 Left lower quadrant pain: Secondary | ICD-10-CM

## 2020-08-08 DIAGNOSIS — R1084 Generalized abdominal pain: Secondary | ICD-10-CM | POA: Diagnosis not present

## 2020-08-08 DIAGNOSIS — Z7982 Long term (current) use of aspirin: Secondary | ICD-10-CM | POA: Diagnosis not present

## 2020-08-08 DIAGNOSIS — E876 Hypokalemia: Secondary | ICD-10-CM

## 2020-08-08 LAB — CBC WITH DIFFERENTIAL/PLATELET
Abs Immature Granulocytes: 0.09 10*3/uL — ABNORMAL HIGH (ref 0.00–0.07)
Basophils Absolute: 0 10*3/uL (ref 0.0–0.1)
Basophils Relative: 0 %
Eosinophils Absolute: 0.1 10*3/uL (ref 0.0–0.5)
Eosinophils Relative: 1 %
HCT: 37.9 % (ref 36.0–46.0)
Hemoglobin: 13.2 g/dL (ref 12.0–15.0)
Immature Granulocytes: 1 %
Lymphocytes Relative: 5 %
Lymphs Abs: 0.9 10*3/uL (ref 0.7–4.0)
MCH: 30.9 pg (ref 26.0–34.0)
MCHC: 34.8 g/dL (ref 30.0–36.0)
MCV: 88.8 fL (ref 80.0–100.0)
Monocytes Absolute: 1.1 10*3/uL — ABNORMAL HIGH (ref 0.1–1.0)
Monocytes Relative: 6 %
Neutro Abs: 16.5 10*3/uL — ABNORMAL HIGH (ref 1.7–7.7)
Neutrophils Relative %: 87 %
Platelets: 243 10*3/uL (ref 150–400)
RBC: 4.27 MIL/uL (ref 3.87–5.11)
RDW: 14.6 % (ref 11.5–15.5)
WBC: 18.8 10*3/uL — ABNORMAL HIGH (ref 4.0–10.5)
nRBC: 0 % (ref 0.0–0.2)

## 2020-08-08 LAB — CREATININE, SERUM
Creatinine, Ser: 0.57 mg/dL (ref 0.44–1.00)
GFR, Estimated: >60 mL/min (ref >60)

## 2020-08-08 LAB — URINALYSIS, COMPLETE (UACMP) WITH MICROSCOPIC
Bacteria, UA: NONE SEEN
Bilirubin Urine: NEGATIVE
Glucose, UA: NEGATIVE mg/dL
Hgb urine dipstick: NEGATIVE
Ketones, ur: NEGATIVE mg/dL
Leukocytes,Ua: NEGATIVE
Nitrite: NEGATIVE
Protein, ur: NEGATIVE mg/dL
Specific Gravity, Urine: 1.029 (ref 1.005–1.030)
Squamous Epithelial / HPF: NONE SEEN (ref 0–5)
pH: 7 (ref 5.0–8.0)

## 2020-08-08 LAB — CBC
HCT: 34.2 % — ABNORMAL LOW (ref 36.0–46.0)
Hemoglobin: 11.9 g/dL — ABNORMAL LOW (ref 12.0–15.0)
MCH: 30.9 pg (ref 26.0–34.0)
MCHC: 34.8 g/dL (ref 30.0–36.0)
MCV: 88.8 fL (ref 80.0–100.0)
Platelets: 229 10*3/uL (ref 150–400)
RBC: 3.85 MIL/uL — ABNORMAL LOW (ref 3.87–5.11)
RDW: 14.6 % (ref 11.5–15.5)
WBC: 12.1 10*3/uL — ABNORMAL HIGH (ref 4.0–10.5)
nRBC: 0 % (ref 0.0–0.2)

## 2020-08-08 LAB — COMPREHENSIVE METABOLIC PANEL
ALT: 45 U/L — ABNORMAL HIGH (ref 0–44)
AST: 66 U/L — ABNORMAL HIGH (ref 15–41)
Albumin: 4.1 g/dL (ref 3.5–5.0)
Alkaline Phosphatase: 80 U/L (ref 38–126)
Anion gap: 9 (ref 5–15)
BUN: 17 mg/dL (ref 8–23)
CO2: 21 mmol/L — ABNORMAL LOW (ref 22–32)
Calcium: 9.1 mg/dL (ref 8.9–10.3)
Chloride: 102 mmol/L (ref 98–111)
Creatinine, Ser: 0.69 mg/dL (ref 0.44–1.00)
GFR, Estimated: >60 mL/min (ref >60)
Glucose, Bld: 116 mg/dL — ABNORMAL HIGH (ref 70–99)
Potassium: 3.9 mmol/L (ref 3.5–5.1)
Sodium: 132 mmol/L — ABNORMAL LOW (ref 135–145)
Total Bilirubin: 0.9 mg/dL (ref 0.3–1.2)
Total Protein: 7 g/dL (ref 6.5–8.1)

## 2020-08-08 LAB — TROPONIN I (HIGH SENSITIVITY)
Troponin I (High Sensitivity): 4 ng/L (ref <18)
Troponin I (High Sensitivity): 3 ng/L (ref <18)

## 2020-08-08 LAB — LIPASE, BLOOD: Lipase: 49 U/L (ref 11–51)

## 2020-08-08 LAB — HIV ANTIBODY (ROUTINE TESTING W REFLEX): HIV Screen 4th Generation wRfx: NONREACTIVE

## 2020-08-08 LAB — RESP PANEL BY RT-PCR (FLU A&B, COVID) ARPGX2
Influenza A by PCR: NEGATIVE
Influenza B by PCR: NEGATIVE
SARS Coronavirus 2 by RT PCR: NEGATIVE

## 2020-08-08 MED ORDER — ACETAMINOPHEN 650 MG RE SUPP: 650.0000 mg | Freq: Four times a day (QID) | RECTAL | Status: DC | PRN

## 2020-08-08 MED ORDER — ENOXAPARIN SODIUM 40 MG/0.4ML ~~LOC~~ SOLN
40.0000 mg | SUBCUTANEOUS | Status: DC
  Administered 2020-08-08 – 2020-08-09 (×2): 40 mg via SUBCUTANEOUS
  Filled 2020-08-08 (×2): qty 0.4

## 2020-08-08 MED ORDER — HYDRALAZINE HCL 50 MG PO TABS
25.0000 mg | ORAL_TABLET | Freq: Two times a day (BID) | ORAL | Status: DC
Start: 2020-08-08 — End: 2020-08-09
  Administered 2020-08-08 – 2020-08-09 (×2): 25 mg via ORAL
  Filled 2020-08-08 (×2): qty 1

## 2020-08-08 MED ORDER — LACTATED RINGERS IV SOLN: INTRAVENOUS | Status: DC

## 2020-08-08 MED ORDER — ONDANSETRON HCL 4 MG PO TABS: 4.0000 mg | ORAL_TABLET | Freq: Four times a day (QID) | ORAL | Status: DC | PRN

## 2020-08-08 MED ORDER — FAMOTIDINE IN NACL 20-0.9 MG/50ML-% IV SOLN
20.0000 mg | Freq: Once | INTRAVENOUS | Status: AC
  Administered 2020-08-08: 20 mg via INTRAVENOUS
  Filled 2020-08-08: qty 50

## 2020-08-08 MED ORDER — CITALOPRAM HYDROBROMIDE 20 MG PO TABS
20.0000 mg | ORAL_TABLET | Freq: Every day | ORAL | Status: DC
  Administered 2020-08-09: 20 mg via ORAL
  Filled 2020-08-08: qty 1

## 2020-08-08 MED ORDER — METOPROLOL SUCCINATE ER 25 MG PO TB24
25.0000 mg | ORAL_TABLET | Freq: Every evening | ORAL | Status: DC
  Administered 2020-08-08: 18:00:00 25 mg via ORAL
  Filled 2020-08-08: qty 1

## 2020-08-08 MED ORDER — ONDANSETRON HCL 4 MG/2ML IJ SOLN
4.0000 mg | Freq: Once | INTRAMUSCULAR | Status: AC
  Administered 2020-08-08: 4 mg via INTRAVENOUS
  Filled 2020-08-08: qty 2

## 2020-08-08 MED ORDER — IOHEXOL 300 MG/ML  SOLN
100.0000 mL | Freq: Once | INTRAMUSCULAR | Status: AC | PRN
  Administered 2020-08-08: 100 mL via INTRAVENOUS

## 2020-08-08 MED ORDER — SODIUM CHLORIDE 0.9 % IV SOLN
12.5000 mg | Freq: Four times a day (QID) | INTRAVENOUS | Status: DC | PRN
  Administered 2020-08-08 (×2): 12.5 mg via INTRAVENOUS
  Filled 2020-08-08 (×2): qty 0.5

## 2020-08-08 MED ORDER — FAMOTIDINE 20 MG PO TABS
20.0000 mg | ORAL_TABLET | Freq: Every day | ORAL | Status: DC
  Administered 2020-08-08: 22:00:00 20 mg via ORAL
  Filled 2020-08-08: qty 1

## 2020-08-08 MED ORDER — PANTOPRAZOLE SODIUM 40 MG PO TBEC: 40.0000 mg | DELAYED_RELEASE_TABLET | Freq: Every day | ORAL | Status: DC

## 2020-08-08 MED ORDER — KETOROLAC TROMETHAMINE 15 MG/ML IJ SOLN
15.0000 mg | Freq: Three times a day (TID) | INTRAMUSCULAR | Status: AC | PRN
  Administered 2020-08-08: 12:00:00 15 mg via INTRAVENOUS
  Filled 2020-08-08 (×2): qty 1

## 2020-08-08 MED ORDER — ACETAMINOPHEN 325 MG PO TABS: 650.0000 mg | ORAL_TABLET | Freq: Four times a day (QID) | ORAL | Status: DC | PRN

## 2020-08-08 MED ORDER — ASPIRIN EC 81 MG PO TBEC
81.0000 mg | DELAYED_RELEASE_TABLET | Freq: Every day | ORAL | Status: DC
  Administered 2020-08-09: 09:00:00 81 mg via ORAL
  Filled 2020-08-08: qty 1

## 2020-08-08 MED ORDER — ONDANSETRON HCL 4 MG/2ML IJ SOLN: 4.0000 mg | Freq: Four times a day (QID) | INTRAMUSCULAR | Status: DC | PRN

## 2020-08-08 MED ORDER — MORPHINE SULFATE (PF) 2 MG/ML IV SOLN
2.0000 mg | INTRAVENOUS | Status: AC | PRN
Start: 2020-08-08 — End: 2020-08-09

## 2020-08-08 MED ORDER — SODIUM CHLORIDE 0.9 % IV BOLUS
1000.0000 mL | Freq: Once | INTRAVENOUS | Status: AC
  Administered 2020-08-08: 1000 mL via INTRAVENOUS

## 2020-08-08 MED ORDER — ONDANSETRON HCL 4 MG/2ML IJ SOLN
4.0000 mg | Freq: Four times a day (QID) | INTRAMUSCULAR | Status: DC | PRN
  Administered 2020-08-08 (×3): 4 mg via INTRAVENOUS
  Filled 2020-08-08 (×3): qty 2

## 2020-08-08 MED ORDER — ATORVASTATIN CALCIUM 20 MG PO TABS
20.0000 mg | ORAL_TABLET | Freq: Every day | ORAL | Status: DC
  Administered 2020-08-09: 09:00:00 20 mg via ORAL
  Filled 2020-08-08: qty 1

## 2020-08-08 MED ORDER — IOHEXOL 9 MG/ML PO SOLN
500.0000 mL | ORAL | Status: AC
  Administered 2020-08-08: 500 mL via ORAL

## 2020-08-08 MED ORDER — ALBUTEROL SULFATE HFA 108 (90 BASE) MCG/ACT IN AERS
2.0000 | INHALATION_SPRAY | Freq: Four times a day (QID) | RESPIRATORY_TRACT | Status: DC | PRN
  Filled 2020-08-08: qty 6.7

## 2020-08-08 MED ORDER — SODIUM CHLORIDE 0.9 % IV SOLN: INTRAVENOUS | Status: DC

## 2020-08-08 MED ORDER — MORPHINE SULFATE (PF) 2 MG/ML IV SOLN
2.0000 mg | INTRAVENOUS | Status: DC | PRN
  Administered 2020-08-08 (×2): 2 mg via INTRAVENOUS
  Filled 2020-08-08 (×2): qty 1

## 2020-08-08 MED ORDER — MIDAZOLAM HCL 2 MG/2ML IJ SOLN
0.5000 mg | Freq: Once | INTRAMUSCULAR | Status: AC
  Administered 2020-08-08: 0.5 mg via INTRAVENOUS
  Filled 2020-08-08: qty 2

## 2020-08-08 MED ORDER — PANTOPRAZOLE SODIUM 40 MG IV SOLR
40.0000 mg | INTRAVENOUS | Status: DC
  Administered 2020-08-08 – 2020-08-09 (×2): 40 mg via INTRAVENOUS
  Filled 2020-08-08 (×2): qty 40

## 2020-08-08 MED ORDER — LORAZEPAM 0.5 MG PO TABS
0.5000 mg | ORAL_TABLET | Freq: Three times a day (TID) | ORAL | Status: DC | PRN
  Administered 2020-08-08: 0.5 mg via ORAL
  Filled 2020-08-08: qty 1

## 2020-08-08 MED ORDER — HYDROMORPHONE HCL 1 MG/ML IJ SOLN
0.5000 mg | Freq: Once | INTRAMUSCULAR | Status: AC
  Administered 2020-08-08: 0.5 mg via INTRAVENOUS
  Filled 2020-08-08: qty 1

## 2020-08-08 MED ORDER — SPIRONOLACTONE 25 MG PO TABS
25.0000 mg | ORAL_TABLET | Freq: Every day | ORAL | Status: DC
  Administered 2020-08-09: 09:00:00 25 mg via ORAL
  Filled 2020-08-08 (×2): qty 1

## 2020-08-08 NOTE — ED Triage Notes (Signed)
Received 4mg  zofran en route

## 2020-08-08 NOTE — ED Notes (Signed)
CT still waiting on creatine to take her to CT

## 2020-08-08 NOTE — H&P (Addendum)
History and Physical   MITZEL MIRANTE MWN:[id number] DOB: [birth year] DOA: 08/08/2020  PCP: [name], MD  Outpatient Specialists: Dr. Rennis Golden, cardiology Patient coming from: home  I have personally briefly reviewed patient's old medical records in [facility] EMR.  Chief Concern: Intractable nausea and vomiting  HPI: [name] is a 70 y.o. female with medical history significant for Hypertension, hyperlipidemia, depression, anxiety, recent grief, hypertension, esophagitis on CT, recent COVID-19 infection in February 2022, presents to the emergency department for chief concerns of intractable nausea and vomiting.  She states the intractable nausea and vomiting started on the morning of 08/07/2020.  She also endorses to episodes of watery brown/green diarrhea.  She denies fever, melena.  She reports she has never felt this way before.  She denies changes to her diet however does endorse that she ate approximately 42 cookies and sent a lot of blueberries due to her sister recently passing away.  Patient denies recent antibiotic use or hospitalization.  She does endorse being exposed to healthcare settings in visiting sick loved ones.  [name] states that her sister passed away April 19, 2023 morning from bacteremia and stomach infection.  Patient states that she did visit her sister in the hospital.  She endorses dry heaves and diarrhea at this time.  She states that she had to watery bowel movements while in the emergency department.  At bedside, patient was able to tell me her full name, her age, her current location.  She states "help me, I am sick".  She is tearful. She is actively dry heaving.  She was able to tolerate physical exam including palpation of her abdomen.  Social history: She lives alone as her sister just passed away now.  Patient states that she has never been a tobacco user, does not drink alcohol, does not use recreational drugs.  Vaccination: She endorses to dose of  vaccination for COVID-19 no booster.  ROS: Constitutional: no weight change, no fever ENT/Mouth: no sore throat, no rhinorrhea Eyes: no eye pain, no vision changes Cardiovascular: no chest pain, no dyspnea,  no edema, no palpitations Respiratory: no cough, no sputum, no wheezing Gastrointestinal: + nausea, + vomiting, + diarrhea, no constipation Genitourinary: no urinary incontinence, no dysuria, no hematuria Musculoskeletal: no arthralgias, no myalgias Skin: no skin lesions, no pruritus, Neuro: + weakness, no loss of consciousness, no syncope Psych: no anxiety, no depression, + decrease appetite Heme/Lymph: no bruising, no bleeding  ED Course: Patient is a carryover from overnight.  In the emergency department she was given ondansetron, 3 doses, Versed 0.5 mg IV, Dilaudid 0.5 mg, normal saline 2 L bolus.  Patient's vital was reassuring with temperature of 98.4, respiration rate of 16, heart rate 98, blood pressure 143/58, patient satting at 94% on room air. Covid, influenza A and B tests were negative.   CT of the abdomen and pelvis was read a small hiatal hernia.  Circumferential thickening of the distal esophagus may reflect changes to reflux esophagitis.  Horseshoe kidney.  Mild nonobstructive left nephrolithiasis.  No urolithiasis.  No hydronephrosis.  Assessment/Plan  Active Problems:   Essential hypertension   Intractable vomiting   Esophagitis on CT   Intractable nausea and vomiting with diarrhea-query gastroenteritis suspect secondary to profound amount of cookies and blueberries -C. difficile ordered, GI panel ordered -Supportive care with ondansetron p.o. and IV for nausea and vomiting, Phenergan as needed for intractable/refractory nausea and vomiting. -Pending C. difficile results and GI panel we will treat as appropriate if  there is infectious diarrhea -CT abdomen pelvis as above and essentially unremarkable for any acute pathology, noted to have reflux  esophagitis  Hypertension-resumed home metoprolol succinate 25 mg daily, spironolactone 25 mg daily, hydralazine 25 mg twice daily home dosing  Hyperlipidemia-resumed atorvastatin 20 mg daily  Depression/anxiety-resumed home citalopram 20 mg daily, Ativan 0.5 mg p.o. twice as needed for anxiety, patient denies SI/HI  GERD-resumed home PPI and Pepcid  Chart reviewed.   DVT prophylaxis: Enoxaparin 40 mg subcutaneous every 24 hours, TED hose Code Status: Full code Diet: Heart healthy as tolerated Family Communication: attempted to call daughter twice, left one voicemail Disposition Plan: Pending clinical course Consults called: None at this time Admission status: Observation, MedSurg, no telemetry  Past Medical History:  Diagnosis Date  . Bradycardia   . Chest pain   . Constipation   . Depression   . Diverticulitis   . Diverticulosis of colon   . DOE (dyspnea on exertion)   . DVT (deep venous thrombosis) (HCC)   . Esophageal stricture   . Hyperlipemia   . Hypertension   . IBS (irritable bowel syndrome)   . Sleep apnea    Past Surgical History:  Procedure Laterality Date  . APPENDECTOMY    . BLOOD CLOT  2004   RIGHT LEG  . CHOLECYSTECTOMY    . COLON SURGERY    . GALLBLADDER SURGERY  2003  . NISSEN FUNDOPLICATION    . PLANTAR FASCIA SURGERY  2007  . sigmoid colectomy  10/21/2010   pelvic anastomosis  . VAGINAL HYSTERECTOMY     Social History:  reports that she has never smoked. She has never used smokeless tobacco. She reports that she does not drink alcohol and does not use drugs.  Allergies  Allergen Reactions  . Dicyclomine Other (See Comments)    REACTION: Choking  . Lisinopril Other (See Comments)    REACTION: Choking  . Losartan Shortness Of Breath  . Ace Inhibitors Other (See Comments)    Unknown to patient  . Penicillins Itching and Rash   Family History  Problem Relation Age of Onset  . Heart disease Mother   . Crohn's disease Sister   . Colon  polyps Sister   . Irritable bowel syndrome Brother   . Brain cancer Father   . Colitis Sister   . Diabetes Sister   . Breast cancer Cousin   . Colon cancer Neg Hx    Family history: Family history reviewed and not pertinent  Prior to Admission medications   Medication Sig Start Date End Date Taking? Authorizing Provider  albuterol (PROVENTIL HFA;VENTOLIN HFA) 108 (90 Base) MCG/ACT inhaler Inhale 2 puffs into the lungs every 6 (six) hours as needed for wheezing or shortness of breath. 11/16/17  Yes Wieting, Richard, MD  aspirin 81 MG tablet Take 1 tablet (81 mg total) by mouth daily. 10/27/17  Yes Duke, Roe Rutherford, PA  atorvastatin (LIPITOR) 20 MG tablet Take 20 mg by mouth daily.   Yes [provider]  citalopram (CELEXA) 20 MG tablet Take 20 mg by mouth daily. 04/05/19  Yes [provider]  famotidine (PEPCID) 20 MG tablet Take 20 mg by mouth at bedtime.  03/06/19  Yes [provider]  hydrALAZINE (APRESOLINE) 25 MG tablet Take 25 mg by mouth in the morning and at bedtime.   Yes [provider]  LORazepam (ATIVAN) 0.5 MG tablet Take 0.5 mg by mouth every 8 (eight) hours as needed. For anxiety   Yes [provider]  metoprolol succinate (TOPROL-XL) 25 MG 24 hr tablet TAKE 1 TABLET BY MOUTH TWICE DAILY FOR 90 DAYS 02/15/18  Yes [provider]  omeprazole (PRILOSEC) 20 MG capsule Take 20 mg by mouth daily.   Yes [provider]  spironolactone (ALDACTONE) 25 MG tablet Take 1 tablet (25 mg total) by mouth daily. 04/20/19 07/19/19 Yes Jodelle Gross, NP  fluticasone [facility]) 50 MCG/ACT nasal spray Place 2 sprays into both nostrils as needed. Patient not taking: No sig reported 04/05/19   [provider]  hydrochlorothiazide (HYDRODIURIL) 25 MG tablet Take 1 tablet (25 mg total) by mouth daily. Patient not taking: Reported on 08/08/2020 03/08/15   Chrystie Nose, MD   Physical Exam: Vitals:   08/08/20 0500 08/08/20  0530 08/08/20 0600 08/08/20 0700  BP: (!) 138/57 (!) 144/64 (!) 143/60 (!) 143/58  Pulse: 87 87 85 84  Resp: (!) 24 (!) 24 (!) 25 (!) 26  Temp:      TempSrc:      SpO2: 100% 100% 100% 95%  Weight:      Height:       Constitutional: appears age-appropriate, NAD, uncomfortable, and tearful Eyes: PERRL, lids and conjunctivae normal ENMT: Mucous membranes are moist. Posterior pharynx clear of any exudate or lesions. Age-appropriate dentition. Hearing appropriate Neck: normal, supple, no masses, no thyromegaly Respiratory: clear to auscultation bilaterally, no wheezing, no crackles. Normal respiratory effort. No accessory muscle use.  Cardiovascular: Regular rate and rhythm, no murmurs / rubs / gallops. No extremity edema. 2+ pedal pulses. No carotid bruits.  Abdomen: Soft, no tenderness, no masses palpated, no hepatosplenomegaly. Bowel sounds positive.  Musculoskeletal: no clubbing / cyanosis. No joint deformity upper and lower extremities. Good ROM, no contractures, no atrophy. Normal muscle tone.  Skin: no rashes, lesions, ulcers. No induration Neurologic: Sensation intact. Strength 5/5 in all 4.  Psychiatric: Normal judgment and insight. Alert and oriented x 3. Normal mood.   EKG: independently reviewed, showing sinus rhythm with rate of 87, QTc 436  Chest x-ray on Admission: I personally reviewed and I agree with radiologist reading as below.  CT Abdomen Pelvis W Contrast  Result Date: 08/08/2020 CLINICAL DATA:  Nausea, vomiting, diverticulitis EXAM: CT ABDOMEN AND PELVIS WITH CONTRAST TECHNIQUE: Multidetector CT imaging of the abdomen and pelvis was performed using the standard protocol following bolus administration of intravenous contrast. CONTRAST:  OMNIPAQUE IOHEXOL 300 MG/ML  SOLN COMPARISON:  03/08/2019 FINDINGS: Lower chest: The visualized lung bases are clear bilaterally. The visualized heart and pericardium are unremarkable. Small hiatal hernia. Circumferential thickening  of the distal esophagus may reflect changes of esophagitis, such as reflux esophagitis. Hepatobiliary: No focal liver abnormality is seen. Status post cholecystectomy. No biliary dilatation. Pancreas: Unremarkable Spleen: Unremarkable Adrenals/Urinary Tract: The adrenal glands are unremarkable. Horseshoe kidney with fibrous band connecting the lower pole moieties of the kidneys. Nonobstructing left nephrolithiasis with 2 nonobstructing calculi within the upper pole measuring up to 3 mm. No hydronephrosis. No urolithiasis. No enhancing intrarenal masses. Bladder unremarkable. Stomach/Bowel: Status post sigmoid colectomy the stomach, small bowel, and large bowel are otherwise unremarkable. Appendix absent. No free intraperitoneal gas or fluid. Vascular/Lymphatic: Moderate aortoiliac atherosclerotic calcification. No aortic aneurysm. No pathologic adenopathy within the abdomen and pelvis. Reproductive: Status post hysterectomy. No adnexal masses. Other: No abdominal wall hernia.  Rectum unremarkable. Musculoskeletal: Degenerative changes are seen within the lumbar spine and hips. No lytic or blastic bone lesion. No acute bone abnormality. IMPRESSION: Small hiatal hernia. Circumferential thickening of the distal  esophagus may reflect changes of reflux esophagitis. Horseshoe kidney. Mild nonobstructing left nephrolithiasis. No urolithiasis. No hydronephrosis. Aortic Atherosclerosis (ICD10-I70.0). Electronically Signed   By: Helyn Numbers MD   On: 08/08/2020 03:54   Labs on Admission: I have personally reviewed following labs  CBC: Recent Labs  Lab 08/08/20 0139 08/08/20 0644  WBC 18.8* 12.1*  NEUTROABS 16.5*  --   HGB 13.2 11.9*  HCT 37.9 34.2*  MCV 88.8 88.8  PLT 243 229   Basic Metabolic Panel: Recent Labs  Lab 08/08/20 0139 08/08/20 0644  NA 132*  --   K 3.9  --   CL 102  --   CO2 21*  --   GLUCOSE 116*  --   BUN 17  --   CREATININE 0.69 0.57  CALCIUM 9.1  --    GFR: Estimated  Creatinine Clearance: 58.1 mL/min (by C-G formula based on SCr of 0.57 mg/dL). Liver Function Tests: Recent Labs  Lab 08/08/20 0139  AST 66*  ALT 45*  ALKPHOS 80  BILITOT 0.9  PROT 7.0  ALBUMIN 4.1   Recent Labs  Lab 08/08/20 0139  LIPASE 49   Urine analysis:    Component Value Date/Time   COLORURINE YELLOW (A) 08/08/2020 0427   APPEARANCEUR CLEAR (A) 08/08/2020 0427   APPEARANCEUR Clear 11/22/2013 0741   LABSPEC 1.029 08/08/2020 0427   LABSPEC 1.010 11/22/2013 0741   PHURINE 7.0 08/08/2020 0427   GLUCOSEU NEGATIVE 08/08/2020 0427   GLUCOSEU Negative 11/22/2013 0741   GLUCOSEU NEGATIVE 08/12/2010 1641   HGBUR NEGATIVE 08/08/2020 0427   BILIRUBINUR NEGATIVE 08/08/2020 0427   BILIRUBINUR Negative 11/22/2013 0741   KETONESUR NEGATIVE 08/08/2020 0427   PROTEINUR NEGATIVE 08/08/2020 0427   UROBILINOGEN 1.0 08/13/2010 1052   NITRITE NEGATIVE 08/08/2020 0427   LEUKOCYTESUR NEGATIVE 08/08/2020 0427   LEUKOCYTESUR Trace 11/22/2013 0741   Samreen Seltzer N Demetria Iwai D.O. [facility]  If 7PM-7AM, please contact overnight-coverage provider If 7AM-7PM, please contact day coverage provider [email or web address]  08/08/2020, 7:32 AM

## 2020-08-08 NOTE — Progress Notes (Signed)
   08/08/20 1305  Clinical Encounter Type  Visited With Patient and family together  Visit Type Initial  Referral From Nurse  Consult/Referral To [place] responded to an OR for prayer for room 103A.

## 2020-08-08 NOTE — Care Management Obs Status (Signed)
[place] NOTIFICATION   Patient Details  Name: [name] MRN: [id number] Date of Birth: 11-01-1950   Medicare Observation Status Notification Given:  Yes    Shelbie Hutching, RN 08/08/2020, 11:12 AM

## 2020-08-08 NOTE — ED Notes (Signed)
In and out catheterization performed. Pt tolerated well.

## 2020-08-08 NOTE — TOC Initial Note (Signed)
Transition of Care [facility]) - Initial/Assessment Note    Patient Details  Name: [name] MRN: [id number] Date of Birth: [birth year]  [facility] CM/SW Contact:    Shelbie Hutching, RN Phone Number: 08/08/2020, 11:16 AM  Clinical Narrative:                 Patient placed under observation for intractable nausea and vomiting.  RNCM met with patient and patient's daughter, [name], at the bedside.  Patient is notably in discomfort.  MOON reviewed with patient's daughter.  Patient is from home where she lives alone and is independent and drives.  Patient is current with PCP Dr. Sabra Heck and uses [facility] on [address] for prescriptions.  No needs identified at this time.    Expected Discharge Plan: Home/Self Care Barriers to Discharge: Continued Medical Work up   Patient Goals and CMS Choice Patient states their goals for this hospitalization and ongoing recovery are:: To feel better      Expected Discharge Plan and Services Expected Discharge Plan: Home/Self Care       Living arrangements for the past 2 months: Single Family Home                 DME Arranged: N/A DME Agency: NA       HH Arranged: NA [place] Agency: NA        Prior Living Arrangements/Services Living arrangements for the past 2 months: Single Family Home Lives with:: Self Patient language and need for interpreter reviewed:: Yes Do you feel safe going back to the place where you live?: Yes      Need for Family Participation in Patient Care: Yes (Comment) (intractable nausea and vomiting) Care giver support system in place?: Yes (comment) (family)   Criminal Activity/Legal Involvement Pertinent to Current Situation/Hospitalization: No - Comment as needed  Activities of Daily Living Home Assistive Devices/Equipment: None ADL Screening (condition at time of admission) Patient's cognitive ability adequate to safely complete daily activities?: Yes Is the patient deaf or have difficulty hearing?:  No Does the patient have difficulty seeing, even when wearing glasses/contacts?: No Does the patient have difficulty concentrating, remembering, or making decisions?: No Patient able to express need for assistance with ADLs?: Yes Does the patient have difficulty dressing or bathing?: No Independently performs ADLs?: Yes (appropriate for developmental age) Does the patient have difficulty walking or climbing stairs?: Yes Weakness of Legs: Both Weakness of Arms/Hands: None  Permission Sought/Granted Permission sought to share information with : Case Manager,Family Supports Permission granted to share information with : Yes, Verbal Permission Granted  Share Information with NAME: [name]     Permission granted to share info w Relationship: daughter     Emotional Assessment Appearance:: Appears stated age Attitude/Demeanor/Rapport: Lethargic   Orientation: : Oriented to Self,Oriented to Place,Oriented to  Time,Oriented to Situation Alcohol / Substance Use: Not Applicable Psych Involvement: No (comment)  Admission diagnosis:  Intractable vomiting [R11.10] Nausea vomiting and diarrhea [R11.2, R19.7] Esophagitis [K20.90] Left lower quadrant abdominal pain [R10.32] Patient Active Problem List   Diagnosis Date Noted  . Intractable vomiting 08/08/2020  . Esophagitis on CT 08/08/2020  . SVT (supraventricular tachycardia) ([place]) 03/07/2019  . Acute respiratory distress 11/16/2017  . Acute respiratory failure ([place]) 11/16/2017  . Screening for lipid disorders 07/28/2016  . DOE (dyspnea on exertion) 07/02/2016  . Bradycardia 07/02/2016  . Anxiety 07/02/2016  . Anxiety state   . Atypical chest pain 03/08/2015  . Fever 08/12/2010  . ABDOMINAL PAIN,  LEFT LOWER QUADRANT 06/23/2010  . DIVERTICULITIS, ACUTE 02/25/2010  . CONSTIPATION 01/22/2010  . IRRITABLE BOWEL SYNDROME 01/22/2010  . Essential hypertension 01/21/2010  . DEPRESSION 01/21/2010  . HYPERTENSION, CONTROLLED 01/21/2010  .  DEEP VENOUS THROMBOPHLEBITIS 01/21/2010  . ESOPHAGEAL STRICTURE 02/29/2004  . Diverticulosis of colon 02/29/2004   PCP:  Rusty Aus, MD Pharmacy:   [facility] [address], [place] - [place] [address] [place] [address] Phone: [phone] Fax: [phone]     Social Determinants of Health (SDOH) Interventions    Readmission Risk Interventions No flowsheet data found.

## 2020-08-08 NOTE — ED Provider Notes (Signed)
[facility] Emergency Department Provider Note   ____________________________________________   Event Date/Time   First MD Initiated Contact with Patient 08/08/20 [phone]     (approximate)  I have reviewed the triage vital signs and the nursing notes.   HISTORY  Chief Complaint Abdominal Pain (Presenting via EMS from home with LLQ pain for "weeks" worsening tonight. Reports it radiates to under L breast. +n/v +extensive abd hx)    HPI [name] is a 70 y.o. female brought to the ED via EMS from home with a chief complaint of abdominal pain, nausea and vomiting.  Patient has a history of IBS, diverticulitis status post colectomy in 2012. Also surgical history of appendectomy, cholecystectomy and hysterectomy.  Complains of left lower quadrant abdominal pain for several weeks, worsening tonight and associated with nausea and vomiting.  Denies fever, chest pain, shortness of breath, dysuria or diarrhea.  Received 4 mg IV Zofran by EMS prior to arrival.     Past Medical History:  Diagnosis Date  . Bradycardia   . Chest pain   . Constipation   . Depression   . Diverticulitis   . Diverticulosis of colon   . DOE (dyspnea on exertion)   . DVT (deep venous thrombosis) ([place])   . Esophageal stricture   . Hyperlipemia   . Hypertension   . IBS (irritable bowel syndrome)   . Sleep apnea     Patient Active Problem List   Diagnosis Date Noted  . Intractable vomiting 08/08/2020  . SVT (supraventricular tachycardia) ([place]) 03/07/2019  . Acute respiratory distress 11/16/2017  . Acute respiratory failure ([place]) 11/16/2017  . Screening for lipid disorders 07/28/2016  . DOE (dyspnea on exertion) 07/02/2016  . Bradycardia 07/02/2016  . Anxiety 07/02/2016  . Anxiety state   . Atypical chest pain 03/08/2015  . Fever 08/12/2010  . ABDOMINAL PAIN, LEFT LOWER QUADRANT 06/23/2010  . DIVERTICULITIS, ACUTE 02/25/2010  . CONSTIPATION 01/22/2010  . IRRITABLE BOWEL  SYNDROME 01/22/2010  . Essential hypertension 01/21/2010  . DEPRESSION 01/21/2010  . HYPERTENSION, CONTROLLED 01/21/2010  . DEEP VENOUS THROMBOPHLEBITIS 01/21/2010  . ESOPHAGEAL STRICTURE 02/29/2004  . Diverticulosis of colon 02/29/2004    Past Surgical History:  Procedure Laterality Date  . APPENDECTOMY    . BLOOD CLOT  2004   RIGHT LEG  . CHOLECYSTECTOMY    . COLON SURGERY    . GALLBLADDER SURGERY  2003  . NISSEN FUNDOPLICATION    . PLANTAR FASCIA SURGERY  2007  . sigmoid colectomy  10/21/2010   pelvic anastomosis  . VAGINAL HYSTERECTOMY      Prior to Admission medications   Medication Sig Start Date End Date Taking? Authorizing Provider  albuterol (PROVENTIL HFA;VENTOLIN HFA) 108 (90 Base) MCG/ACT inhaler Inhale 2 puffs into the lungs every 6 (six) hours as needed for wheezing or shortness of breath. 11/16/17   [name], MD  aspirin 81 MG tablet Take 1 tablet (81 mg total) by mouth daily. 10/27/17   Duke, Tami Lin, PA  atorvastatin (LIPITOR) 20 MG tablet Take 20 mg by mouth daily.    [provider]  citalopram (CELEXA) 20 MG tablet Take 10 mg by mouth daily.  04/05/19   [provider]  famotidine (PEPCID) 20 MG tablet Take 20 mg by mouth at bedtime.  03/06/19   [provider]  fluticasone (FLONASE) 50 MCG/ACT nasal spray Place 2 sprays into both nostrils as needed. 04/05/19   [provider]  hydrALAZINE (APRESOLINE) 25 MG tablet  Take 25 mg by mouth in the morning and at bedtime.    [provider]  hydrochlorothiazide (HYDRODIURIL) 25 MG tablet Take 1 tablet (25 mg total) by mouth daily. 03/08/15   Hilty, Nadean Corwin, MD  LORazepam (ATIVAN) 0.5 MG tablet Take 0.5 mg by mouth every 8 (eight) hours as needed. For anxiety    [provider]  metoprolol succinate (TOPROL-XL) 25 MG 24 hr tablet TAKE 1 TABLET BY MOUTH TWICE DAILY FOR 90 DAYS 02/15/18   [provider]  omeprazole (PRILOSEC) 20 MG capsule Take 20  mg by mouth daily.    [provider]  spironolactone (ALDACTONE) 25 MG tablet Take 1 tablet (25 mg total) by mouth daily. 04/20/19 07/19/19  Lendon Colonel, NP    Allergies Dicyclomine, Lisinopril, Losartan, Ace inhibitors, and Penicillins  Family History  Problem Relation Age of Onset  . Heart disease Mother   . Crohn's disease Sister   . Colon polyps Sister   . Irritable bowel syndrome Brother   . Brain cancer Father   . Colitis Sister   . Diabetes Sister   . Breast cancer Cousin   . Colon cancer Neg Hx     Social History Social History   Tobacco Use  . Smoking status: Never Smoker  . Smokeless tobacco: Never Used  Vaping Use  . Vaping Use: Never used  Substance Use Topics  . Alcohol use: No  . Drug use: No    Review of Systems  Constitutional: No fever/chills Eyes: No visual changes. ENT: No sore throat. Cardiovascular: Denies chest pain. Respiratory: Denies shortness of breath. Gastrointestinal: Positive for abdominal pain, nausea and vomiting.  No diarrhea.  No constipation. Genitourinary: Negative for dysuria. Musculoskeletal: Negative for back pain. Skin: Negative for rash. Neurological: Negative for headaches, focal weakness or numbness.   ____________________________________________   PHYSICAL EXAM:  VITAL SIGNS: ED Triage Vitals  Enc Vitals Group     BP 08/08/20 0103 (!) 162/86     Pulse Rate 08/08/20 0101 (!) 101     Resp 08/08/20 0101 20     Temp 08/08/20 0101 98.4 F (36.9 C)     Temp Source 08/08/20 0101 Oral     SpO2 08/08/20 0101 98 %     Weight --      Height --      Head Circumference --      Peak Flow --      Pain Score --      Pain Loc --      Pain Edu? --      Excl. in [place]? --     Constitutional: Alert and oriented.  Uncomfortable appearing and in moderate acute distress. Eyes: Conjunctivae are normal. PERRL. EOMI. Head: Atraumatic. Nose: No congestion/rhinnorhea. Mouth/Throat: Mucous membranes are mildly  dry. Neck: No stridor.   Cardiovascular: Normal rate, regular rhythm. Grossly normal heart sounds.  Good peripheral circulation. Respiratory: Normal respiratory effort.  No retractions. Lungs CTAB. Gastrointestinal: Soft and moderately tender to palpation left lower quadrant with voluntary guarding, no rebound. No distention. No abdominal bruits. No CVA tenderness. Musculoskeletal: No lower extremity tenderness nor edema.  No joint effusions. Neurologic:  Normal speech and language. No gross focal neurologic deficits are appreciated.  Skin:  Skin is warm, dry and intact. No rash noted. Psychiatric: Mood and affect are normal. Speech and behavior are normal.  ____________________________________________   LABS (all labs ordered are listed, but only abnormal results are displayed)  Labs Reviewed  CBC WITH  DIFFERENTIAL/PLATELET - Abnormal; Notable for the following components:      Result Value   WBC 18.8 (*)    Neutro Abs 16.5 (*)    Monocytes Absolute 1.1 (*)    Abs Immature Granulocytes 0.09 (*)    All other components within normal limits  COMPREHENSIVE METABOLIC PANEL - Abnormal; Notable for the following components:   Sodium 132 (*)    CO2 21 (*)    Glucose, Bld 116 (*)    AST 66 (*)    ALT 45 (*)    All other components within normal limits  URINALYSIS, COMPLETE (UACMP) WITH MICROSCOPIC - Abnormal; Notable for the following components:   Color, Urine YELLOW (*)    APPearance CLEAR (*)    All other components within normal limits  RESP PANEL BY RT-PCR (FLU A&B, COVID) ARPGX2  C DIFFICILE QUICK SCREEN W PCR REFLEX  GASTROINTESTINAL PANEL BY PCR, STOOL (REPLACES STOOL CULTURE)  LIPASE, BLOOD  TROPONIN I (HIGH SENSITIVITY)  TROPONIN I (HIGH SENSITIVITY)   ____________________________________________  EKG  ED ECG REPORT I, Johnita Palleschi J, the attending physician, personally viewed and interpreted this ECG.   Date: 08/08/2020  EKG Time: 0138  Rate: 87  Rhythm: normal  EKG, normal sinus rhythm  Axis: Normal  Intervals:none  ST&T Change: Nonspecific  ____________________________________________  RADIOLOGY I, Doryce Mcgregory J, personally viewed and evaluated these images (plain radiographs) as part of my medical decision making, as well as reviewing the written report by the radiologist.  ED MD interpretation: Small hiatal hernia, reflux esophagitis, nephrolithiasis on the left which is mild and nonobstructing; otherwise unremarkable CT abdomen/pelvis  Official radiology report(s): CT Abdomen Pelvis W Contrast  Result Date: 08/08/2020 CLINICAL DATA:  Nausea, vomiting, diverticulitis EXAM: CT ABDOMEN AND PELVIS WITH CONTRAST TECHNIQUE: Multidetector CT imaging of the abdomen and pelvis was performed using the standard protocol following bolus administration of intravenous contrast. CONTRAST:  168mL OMNIPAQUE IOHEXOL 300 MG/ML  SOLN COMPARISON:  03/08/2019 FINDINGS: Lower chest: The visualized lung bases are clear bilaterally. The visualized heart and pericardium are unremarkable. Small hiatal hernia. Circumferential thickening of the distal esophagus may reflect changes of esophagitis, such as reflux esophagitis. Hepatobiliary: No focal liver abnormality is seen. Status post cholecystectomy. No biliary dilatation. Pancreas: Unremarkable Spleen: Unremarkable Adrenals/Urinary Tract: The adrenal glands are unremarkable. Horseshoe kidney with fibrous band connecting the lower pole moieties of the kidneys. Nonobstructing left nephrolithiasis with 2 nonobstructing calculi within the upper pole measuring up to 3 mm. No hydronephrosis. No urolithiasis. No enhancing intrarenal masses. Bladder unremarkable. Stomach/Bowel: Status post sigmoid colectomy the stomach, small bowel, and large bowel are otherwise unremarkable. Appendix absent. No free intraperitoneal gas or fluid. Vascular/Lymphatic: Moderate aortoiliac atherosclerotic calcification. No aortic aneurysm. No pathologic  adenopathy within the abdomen and pelvis. Reproductive: Status post hysterectomy. No adnexal masses. Other: No abdominal wall hernia.  Rectum unremarkable. Musculoskeletal: Degenerative changes are seen within the lumbar spine and hips. No lytic or blastic bone lesion. No acute bone abnormality. IMPRESSION: Small hiatal hernia. Circumferential thickening of the distal esophagus may reflect changes of reflux esophagitis. Horseshoe kidney. Mild nonobstructing left nephrolithiasis. No urolithiasis. No hydronephrosis. Aortic Atherosclerosis (ICD10-I70.0). Electronically Signed   By: Fidela Salisbury MD   On: 08/08/2020 03:54    ____________________________________________   PROCEDURES  Procedure(s) performed (including Critical Care):  .1-3 Lead EKG Interpretation Performed by: Paulette Blanch, MD Authorized by: Paulette Blanch, MD     Interpretation: abnormal     ECG rate:  101   ECG  rate assessment: tachycardic     Rhythm: sinus tachycardia     Ectopy: none     Conduction: normal   Comments:     Placed on cardiac monitor to evaluate for arrhythmias    CRITICAL CARE Performed by: Paulette Blanch   Total critical care time: 45 minutes  Critical care time was exclusive of separately billable procedures and treating other patients.  Critical care was necessary to treat or prevent imminent or life-threatening deterioration.  Critical care was time spent personally by me on the following activities: development of treatment plan with patient and/or surrogate as well as nursing, discussions with consultants, evaluation of patient's response to treatment, examination of patient, obtaining history from patient or surrogate, ordering and performing treatments and interventions, ordering and review of laboratory studies, ordering and review of radiographic studies, pulse oximetry and re-evaluation of patient's condition.  ____________________________________________   INITIAL IMPRESSION / ASSESSMENT  AND PLAN / ED COURSE  As part of my medical decision making, I reviewed the following data within the [place] notes reviewed and incorporated, Labs reviewed, EKG interpreted, Old chart reviewed, Radiograph reviewed, Discussed with admitting physician and Notes from prior ED visits     70 year old female presenting with left lower quadrant abdominal pain, history of diverticulitis status post colectomy. Differential diagnosis includes, but is not limited to, ovarian cyst, ovarian torsion, acute appendicitis, diverticulitis, urinary tract infection/pyelonephritis, endometriosis, bowel obstruction, colitis, renal colic, gastroenteritis, hernia, fibroids, endometriosis, pregnancy related pain including ectopic pregnancy, etc.   We will obtain lab work, UA, CT abdomen/pelvis.  Initiate IV fluid resuscitation, IV Dilaudid for pain paired with IV Zofran for nausea.  Will reassess  Clinical Course as of 08/08/20 0541  Thu Aug 08, 2020  0242 Pain improved; complains of leg cramps.  Could not tolerate oral contrast for CT scan. [JS]  0408 Patient reports several episodes of diarrhea.  Updated patient and daughter on CT scan results.  Will add Pepcid, additional IV fluids.  Repeat troponin, UA.  Will reassess [JS]  0453 Leg cramping better after IV Versed.  Continues to dry heave.  Will repeat antiemetic.  Will discuss with hospital services for admission. [JS]    Clinical Course User Index [JS] Paulette Blanch, MD     ____________________________________________   FINAL CLINICAL IMPRESSION(S) / ED DIAGNOSES  Final diagnoses:  Left lower quadrant abdominal pain  Esophagitis  Nausea vomiting and diarrhea     ED Discharge Orders    None      *Please note:  [name] was evaluated in Emergency Department on 08/08/2020 for the symptoms described in the history of present illness. She was evaluated in the context of the global COVID-19 pandemic, which necessitated  consideration that the patient might be at risk for infection with the SARS-CoV-2 virus that causes COVID-19. Institutional protocols and algorithms that pertain to the evaluation of patients at risk for COVID-19 are in a state of rapid change based on information released by regulatory bodies including the CDC and federal and state organizations. These policies and algorithms were followed during the patient's care in the ED.  Some ED evaluations and interventions may be delayed as a result of limited staffing during and the pandemic.*   Note:  This document was prepared using Dragon voice recognition software and may include unintentional dictation errors.   Paulette Blanch, MD 08/08/20 [phone]

## 2020-08-08 NOTE — ED Notes (Signed)
Daughter to nurses station stating, "I don't know if she told anyone but over the past few days she's eaten like 42 cookies and a lot of blueberries so I'm not sure if that irritated her stomach

## 2020-08-08 NOTE — ED Notes (Addendum)
Pt states she is unable to drink oral contrast. Beather Arbour MD made aware and CT made aware

## 2020-08-08 NOTE — ED Notes (Signed)
Spoke with Ammiana RN on the phone to give report

## 2020-08-09 DIAGNOSIS — R197 Diarrhea, unspecified: Secondary | ICD-10-CM

## 2020-08-09 DIAGNOSIS — K219 Gastro-esophageal reflux disease without esophagitis: Secondary | ICD-10-CM

## 2020-08-09 DIAGNOSIS — R112 Nausea with vomiting, unspecified: Secondary | ICD-10-CM

## 2020-08-09 DIAGNOSIS — E785 Hyperlipidemia, unspecified: Secondary | ICD-10-CM | POA: Diagnosis not present

## 2020-08-09 DIAGNOSIS — I10 Essential (primary) hypertension: Secondary | ICD-10-CM | POA: Diagnosis not present

## 2020-08-09 DIAGNOSIS — E876 Hypokalemia: Secondary | ICD-10-CM | POA: Diagnosis not present

## 2020-08-09 DIAGNOSIS — A0811 Acute gastroenteropathy due to Norwalk agent: Secondary | ICD-10-CM

## 2020-08-09 DIAGNOSIS — A0819 Acute gastroenteropathy due to other small round viruses: Secondary | ICD-10-CM | POA: Diagnosis not present

## 2020-08-09 LAB — BASIC METABOLIC PANEL
Anion gap: 5 (ref 5–15)
BUN: 14 mg/dL (ref 8–23)
CO2: 23 mmol/L (ref 22–32)
Calcium: 8.2 mg/dL — ABNORMAL LOW (ref 8.9–10.3)
Chloride: 106 mmol/L (ref 98–111)
Creatinine, Ser: 0.66 mg/dL (ref 0.44–1.00)
GFR, Estimated: >60 mL/min (ref >60)
Glucose, Bld: 94 mg/dL (ref 70–99)
Potassium: 3.1 mmol/L — ABNORMAL LOW (ref 3.5–5.1)
Sodium: 134 mmol/L — ABNORMAL LOW (ref 135–145)

## 2020-08-09 LAB — GASTROINTESTINAL PANEL BY PCR, STOOL (REPLACES STOOL CULTURE)

## 2020-08-09 LAB — C DIFFICILE QUICK SCREEN W PCR REFLEX
C Diff antigen: NEGATIVE
C Diff interpretation: NOT DETECTED
C Diff toxin: NEGATIVE

## 2020-08-09 LAB — CBC
HCT: 31.2 % — ABNORMAL LOW (ref 36.0–46.0)
Hemoglobin: 10.6 g/dL — ABNORMAL LOW (ref 12.0–15.0)
MCH: 30.6 pg (ref 26.0–34.0)
MCHC: 34 g/dL (ref 30.0–36.0)
MCV: 90.2 fL (ref 80.0–100.0)
Platelets: 204 10*3/uL (ref 150–400)
RBC: 3.46 MIL/uL — ABNORMAL LOW (ref 3.87–5.11)
RDW: 15 % (ref 11.5–15.5)
WBC: 6.1 10*3/uL (ref 4.0–10.5)
nRBC: 0 % (ref 0.0–0.2)

## 2020-08-09 LAB — POTASSIUM: Potassium: 3.1 mmol/L — ABNORMAL LOW (ref 3.5–5.1)

## 2020-08-09 LAB — MAGNESIUM: Magnesium: 1.7 mg/dL (ref 1.7–2.4)

## 2020-08-09 MED ORDER — POTASSIUM CHLORIDE ER 20 MEQ PO TBCR: 20.0000 meq | EXTENDED_RELEASE_TABLET | Freq: Every day | ORAL | 0 refills | Status: DC

## 2020-08-09 MED ORDER — POTASSIUM CHLORIDE 10 MEQ/100ML IV SOLN
10.0000 meq | INTRAVENOUS | Status: AC
  Administered 2020-08-09 (×2): 10 meq via INTRAVENOUS
  Filled 2020-08-09: qty 100

## 2020-08-09 MED ORDER — LOPERAMIDE HCL 2 MG PO CAPS
4.0000 mg | ORAL_CAPSULE | Freq: Four times a day (QID) | ORAL | Status: DC | PRN
  Administered 2020-08-09: 09:00:00 4 mg via ORAL
  Filled 2020-08-09: qty 2

## 2020-08-09 MED ORDER — LOPERAMIDE HCL 2 MG PO CAPS: 4.0000 mg | ORAL_CAPSULE | Freq: Three times a day (TID) | ORAL | 0 refills | Status: DC | PRN

## 2020-08-09 MED ORDER — ONDANSETRON HCL 4 MG PO TABS: 4.0000 mg | ORAL_TABLET | Freq: Four times a day (QID) | ORAL | 0 refills | Status: DC | PRN

## 2020-08-09 MED ORDER — MAGNESIUM OXIDE 400 MG PO CAPS
400.0000 mg | ORAL_CAPSULE | Freq: Every day | ORAL | 0 refills | Status: AC
Start: 2020-08-09 — End: 2020-08-12

## 2020-08-09 MED ORDER — MAGNESIUM SULFATE 2 GM/50ML IV SOLN
2.0000 g | Freq: Once | INTRAVENOUS | Status: AC
  Administered 2020-08-09: 11:00:00 2 g via INTRAVENOUS
  Filled 2020-08-09: qty 50

## 2020-08-09 NOTE — Discharge Summary (Signed)
[facility] [place] NAME: [name]    MR#:  [id number]  DATE OF BIRTH:  October 25, 1950  DATE OF ADMISSION:  08/08/2020 ADMITTING PHYSICIAN: Athena Masse, MD  DATE OF DISCHARGE: 08/09/2020  3:58 PM  PRIMARY CARE PHYSICIAN: Rusty Aus, MD    ADMISSION DIAGNOSIS:  Intractable vomiting [R11.10] Nausea vomiting and diarrhea [R11.2, R19.7] Esophagitis [K20.90] Left lower quadrant abdominal pain [R10.32]  DISCHARGE DIAGNOSIS:  Norovirus gastroenteritis  SECONDARY DIAGNOSIS:   Past Medical History:  Diagnosis Date  . Bradycardia   . Chest pain   . Constipation   . Depression   . Diverticulitis   . Diverticulosis of colon   . DOE (dyspnea on exertion)   . DVT (deep venous thrombosis) ([place])   . Esophageal stricture   . Hyperlipemia   . Hypertension   . IBS (irritable bowel syndrome)   . Sleep apnea     HOSPITAL COURSE:   1.  Norovirus gastroenteritis.  Patient came in with intractable nausea vomiting.  Also had cramping abdominal pain and diarrhea.  Stool studies showed norovirus.  Patient was given Imodium and her diarrhea settled down.  Patient was able to tolerate some soft food.  Patient wanted to go home.  As needed Imodium.  As needed Zofran.  Advised that she can drink Pedialyte if she thinks she is dehydrated. 2.  Hypomagnesemia and hypokalemia I did prescribe IV magnesium and potassium during the hospital course.  We will give 3 days of magnesium and potassium orally. 3.  Essential hypertension on metoprolol and spironolactone and hydralazine. 4.  Hyperlipidemia on atorvastatin 5.  Depression anxiety on Celexa Ativan 6.  GERD on PPI  DISCHARGE CONDITIONS:   Satisfactory  CONSULTS OBTAINED:  None  DRUG ALLERGIES:   Allergies  Allergen Reactions  . Dicyclomine Other (See Comments)    REACTION: Choking  . Lisinopril Other (See Comments)    REACTION: Choking  . Losartan Shortness Of Breath  . Ace Inhibitors  Other (See Comments)    Unknown to patient  . Penicillins Itching and Rash    DISCHARGE MEDICATIONS:   Allergies as of 08/09/2020      Reactions   Dicyclomine Other (See Comments)   REACTION: Choking   Lisinopril Other (See Comments)   REACTION: Choking   Losartan Shortness Of Breath   Ace Inhibitors Other (See Comments)   Unknown to patient   Penicillins Itching, Rash      Medication List    STOP taking these medications   fluticasone 50 MCG/ACT nasal spray Commonly known as: FLONASE   hydrochlorothiazide 25 MG tablet Commonly known as: HYDRODIURIL     TAKE these medications   albuterol 108 (90 Base) MCG/ACT inhaler Commonly known as: VENTOLIN HFA Inhale 2 puffs into the lungs every 6 (six) hours as needed for wheezing or shortness of breath.   aspirin 81 MG tablet Take 1 tablet (81 mg total) by mouth daily.   atorvastatin 20 MG tablet Commonly known as: LIPITOR Take 20 mg by mouth daily.   citalopram 20 MG tablet Commonly known as: CELEXA Take 20 mg by mouth daily.   famotidine 20 MG tablet Commonly known as: PEPCID Take 20 mg by mouth at bedtime.   hydrALAZINE 25 MG tablet Commonly known as: APRESOLINE Take 25 mg by mouth in the morning and at bedtime.   loperamide 2 MG capsule Commonly known as: IMODIUM Take 2 capsules (4 mg total) by mouth every 8 (eight)  hours as needed for diarrhea or loose stools.   LORazepam 0.5 MG tablet Commonly known as: ATIVAN Take 0.5 mg by mouth every 8 (eight) hours as needed. For anxiety   Magnesium Oxide 400 MG Caps Take 1 capsule (400 mg total) by mouth daily for 3 days.   metoprolol succinate 25 MG 24 hr tablet Commonly known as: TOPROL-XL TAKE 1 TABLET BY MOUTH TWICE DAILY FOR 90 DAYS   omeprazole 20 MG capsule Commonly known as: PRILOSEC Take 20 mg by mouth daily.   ondansetron 4 MG tablet Commonly known as: ZOFRAN Take 1 tablet (4 mg total) by mouth every 6 (six) hours as needed for nausea.   Potassium  Chloride ER 20 MEQ Tbcr Take 20 mEq by mouth daily for 3 days.   spironolactone 25 MG tablet Commonly known as: ALDACTONE Take 1 tablet (25 mg total) by mouth daily.        DISCHARGE INSTRUCTIONS:   Follow-up PMD 1 week  If you experience worsening of your admission symptoms, develop shortness of breath, life threatening emergency, suicidal or homicidal thoughts you must seek medical attention immediately by calling 911 or calling your MD immediately  if symptoms less severe.  You Must read complete instructions/literature along with all the possible adverse reactions/side effects for all the Medicines you take and that have been prescribed to you. Take any new Medicines after you have completely understood and accept all the possible adverse reactions/side effects.   Please note  You were cared for by a hospitalist during your hospital stay. If you have any questions about your discharge medications or the care you received while you were in the hospital after you are discharged, you can call the unit and asked to speak with the hospitalist on call if the hospitalist that took care of you is not available. Once you are discharged, your primary care physician will handle any further medical issues. Please note that NO REFILLS for any discharge medications will be authorized once you are discharged, as it is imperative that you return to your primary care physician (or establish a relationship with a primary care physician if you do not have one) for your aftercare needs so that they can reassess your need for medications and monitor your lab values.    Today   CHIEF COMPLAINT:   Chief Complaint  Patient presents with  . Abdominal Pain    Presenting via EMS from home with LLQ pain for "weeks" worsening tonight. Reports it radiates to under L breast. +n/v +extensive abd hx    HISTORY OF PRESENT ILLNESS:  [name]  is a 70 y.o. female came in with abdominal pain, nausea vomiting  and diarrhea   VITAL SIGNS:  Blood pressure (!) 121/53, pulse 74, temperature 98.5 F (36.9 C), temperature source Oral, resp. rate 20, height 5\' 1"  (1.549 m), weight 67.1 kg, SpO2 96 %.  I/O:    Intake/Output Summary (Last 24 hours) at 08/09/2020 1619 Last data filed at 08/09/2020 0447 Gross per 24 hour  Intake 1978.12 ml  Output --  Net 1978.12 ml    PHYSICAL EXAMINATION:  GENERAL:  70 y.o.-year-old patient lying in the bed with no acute distress.  EYES: Pupils equal, round, reactive to light and accommodation. No scleral icterus. Extraocular muscles intact.  HEENT: Head atraumatic, normocephalic. Oropharynx and nasopharynx clear.  LUNGS: Normal breath sounds bilaterally, no wheezing, rales,rhonchi or crepitation. No use of accessory muscles of respiration.  CARDIOVASCULAR: S1, S2 normal. No murmurs, rubs, or  gallops.  ABDOMEN: Soft, tender left lower quadrant, non-distended.  EXTREMITIES: No pedal edema, cyanosis, or clubbing.  NEUROLOGIC: Cranial nerves II through XII are intact. Muscle strength 5/5 in all extremities. Sensation intact. Gait not checked.  PSYCHIATRIC: The patient is alert and oriented x 3.  SKIN: No obvious rash, lesion, or ulcer.   DATA REVIEW:   CBC Recent Labs  Lab 08/09/20 0427  WBC 6.1  HGB 10.6*  HCT 31.2*  PLT 204    Chemistries  Recent Labs  Lab 08/08/20 0139 08/08/20 0644 08/09/20 0427  NA 132*  --  134*  K 3.9  --  3.1*  3.1*  CL 102  --  106  CO2 21*  --  23  GLUCOSE 116*  --  94  BUN 17  --  14  CREATININE 0.69   < > 0.66  CALCIUM 9.1  --  8.2*  MG  --   --  1.7  AST 66*  --   --   ALT 45*  --   --   ALKPHOS 80  --   --   BILITOT 0.9  --   --    < > = values in this interval not displayed.    Microbiology Results  Results for orders placed or performed during the hospital encounter of 08/08/20  Resp Panel by RT-PCR (Flu A&B, Covid) Nasopharyngeal Swab     Status: None   Collection Time: 08/08/20  1:39 AM   Specimen:  Nasopharyngeal Swab; Nasopharyngeal(NP) swabs in vial transport medium  Result Value Ref Range Status   SARS Coronavirus 2 by RT PCR NEGATIVE NEGATIVE Final    Comment: (NOTE) SARS-CoV-2 target nucleic acids are NOT DETECTED.  The SARS-CoV-2 RNA is generally detectable in upper respiratory specimens during the acute phase of infection. The lowest concentration of SARS-CoV-2 viral copies this assay can detect is 138 copies/mL. A negative result does not preclude SARS-Cov-2 infection and should not be used as the sole basis for treatment or other patient management decisions. A negative result may occur with  improper specimen collection/handling, submission of specimen other than nasopharyngeal swab, presence of viral mutation(s) within the areas targeted by this assay, and inadequate number of viral copies(<138 copies/mL). A negative result must be combined with clinical observations, patient history, and epidemiological information. The expected result is Negative.  Fact Sheet for Patients:  EntrepreneurPulse.com.au  Fact Sheet for Healthcare Providers:  IncredibleEmployment.be  This test is no t yet approved or cleared by the [place] FDA and  has been authorized for detection and/or diagnosis of SARS-CoV-2 by FDA under an Emergency Use Authorization (EUA). This EUA will remain  in effect (meaning this test can be used) for the duration of the COVID-19 declaration under Section 564(b)(1) of the Act, 21 U.S.C.section 360bbb-3(b)(1), unless the authorization is terminated  or revoked sooner.       Influenza A by PCR NEGATIVE NEGATIVE Final   Influenza B by PCR NEGATIVE NEGATIVE Final    Comment: (NOTE) The Xpert Xpress SARS-CoV-2/FLU/RSV plus assay is intended as an aid in the diagnosis of influenza from Nasopharyngeal swab specimens and should not be used as a sole basis for treatment. Nasal washings and aspirates are unacceptable for  Xpert Xpress SARS-CoV-2/FLU/RSV testing.  Fact Sheet for Patients: EntrepreneurPulse.com.au  Fact Sheet for Healthcare Providers: IncredibleEmployment.be  This test is not yet approved or cleared by the [place] FDA and has been authorized for detection and/or diagnosis of SARS-CoV-2 by FDA under an  Emergency Use Authorization (EUA). This EUA will remain in effect (meaning this test can be used) for the duration of the COVID-19 declaration under Section 564(b)(1) of the Act, 21 U.S.C. section 360bbb-3(b)(1), unless the authorization is terminated or revoked.  Performed at [facility], [place], [place] [address]   C Difficile Quick Screen w PCR reflex     Status: None   Collection Time: 08/08/20  3:20 AM   Specimen: STOOL  Result Value Ref Range Status   C Diff antigen NEGATIVE NEGATIVE Final   C Diff toxin NEGATIVE NEGATIVE Final   C Diff interpretation No C. difficile detected.  Final    Comment: Performed at [facility], [address] [place], [place] [address]  Gastrointestinal Panel by PCR , Stool     Status: Abnormal   Collection Time: 08/08/20  3:20 AM   Specimen: Stool  Result Value Ref Range Status   Campylobacter species NOT DETECTED NOT DETECTED Final   Plesimonas shigelloides NOT DETECTED NOT DETECTED Final   Salmonella species NOT DETECTED NOT DETECTED Final   Yersinia enterocolitica NOT DETECTED NOT DETECTED Final   Vibrio species NOT DETECTED NOT DETECTED Final   Vibrio cholerae NOT DETECTED NOT DETECTED Final   Enteroaggregative E coli (EAEC) NOT DETECTED NOT DETECTED Final   Enteropathogenic E coli (EPEC) NOT DETECTED NOT DETECTED Final   Enterotoxigenic E coli (ETEC) NOT DETECTED NOT DETECTED Final   Shiga like toxin producing E coli (STEC) NOT DETECTED NOT DETECTED Final   Shigella/Enteroinvasive E coli (EIEC) NOT DETECTED NOT DETECTED Final   Cryptosporidium NOT DETECTED NOT  DETECTED Final   Cyclospora cayetanensis NOT DETECTED NOT DETECTED Final   Entamoeba histolytica NOT DETECTED NOT DETECTED Final   Giardia lamblia NOT DETECTED NOT DETECTED Final   Adenovirus F40/41 NOT DETECTED NOT DETECTED Final   Astrovirus NOT DETECTED NOT DETECTED Final   Norovirus GI/GII DETECTED (A) NOT DETECTED Final    Comment: RESULT CALLED TO, READ BACK BY AND VERIFIED WITH: KAYLEY WAGNER AT 0515 08/09/20 SDR    Rotavirus A NOT DETECTED NOT DETECTED Final   Sapovirus (I, II, IV, and V) NOT DETECTED NOT DETECTED Final    Comment: Performed at [facility], [address] [place], [place] [address]    RADIOLOGY:  CT Abdomen Pelvis W Contrast  Result Date: 08/08/2020 CLINICAL DATA:  Nausea, vomiting, diverticulitis EXAM: CT ABDOMEN AND PELVIS WITH CONTRAST TECHNIQUE: Multidetector CT imaging of the abdomen and pelvis was performed using the standard protocol following bolus administration of intravenous contrast. CONTRAST:  121mL OMNIPAQUE IOHEXOL 300 MG/ML  SOLN COMPARISON:  03/08/2019 FINDINGS: Lower chest: The visualized lung bases are clear bilaterally. The visualized heart and pericardium are unremarkable. Small hiatal hernia. Circumferential thickening of the distal esophagus may reflect changes of esophagitis, such as reflux esophagitis. Hepatobiliary: No focal liver abnormality is seen. Status post cholecystectomy. No biliary dilatation. Pancreas: Unremarkable Spleen: Unremarkable Adrenals/Urinary Tract: The adrenal glands are unremarkable. Horseshoe kidney with fibrous band connecting the lower pole moieties of the kidneys. Nonobstructing left nephrolithiasis with 2 nonobstructing calculi within the upper pole measuring up to 3 mm. No hydronephrosis. No urolithiasis. No enhancing intrarenal masses. Bladder unremarkable. Stomach/Bowel: Status post sigmoid colectomy the stomach, small bowel, and large bowel are otherwise unremarkable. Appendix absent. No free intraperitoneal  gas or fluid. Vascular/Lymphatic: Moderate aortoiliac atherosclerotic calcification. No aortic aneurysm. No pathologic adenopathy within the abdomen and pelvis. Reproductive: Status post hysterectomy. No adnexal masses. Other: No abdominal  wall hernia.  Rectum unremarkable. Musculoskeletal: Degenerative changes are seen within the lumbar spine and hips. No lytic or blastic bone lesion. No acute bone abnormality. IMPRESSION: Small hiatal hernia. Circumferential thickening of the distal esophagus may reflect changes of reflux esophagitis. Horseshoe kidney. Mild nonobstructing left nephrolithiasis. No urolithiasis. No hydronephrosis. Aortic Atherosclerosis (ICD10-I70.0). Electronically Signed   By: Fidela Salisbury MD   On: 08/08/2020 03:54     Management plans discussed with the patient, family and they are in agreement.  CODE STATUS:     Code Status Orders  (From admission, onward)         Start     Ordered   08/08/20 0739  Full code  Continuous        08/08/20 0741        Code Status History    Date Active Date Inactive Code Status Order ID Comments User Context   08/08/2020 0604 08/08/2020 0741 Full Code [id number]  Athena Masse, MD ED   03/07/2019 0517 03/09/2019 2341 Full Code [id number]  Mansy, Arvella Merles, MD ED   11/16/2017 0335 11/16/2017 1540 Full Code [id number]  Amelia Jo, MD Inpatient   07/02/2016 1700 07/04/2016 2031 Full Code [id number]  Radene Gunning, NP ED   Advance Care Planning Activity    Advance Directive Documentation   Flowsheet Row Most Recent Value  Type of Advance Directive Healthcare Power of Attorney, Living will  Pre-existing out of facility DNR order (yellow form or pink MOST form) --  "MOST" Form in Place? --      TOTAL TIME TAKING CARE OF THIS PATIENT: 34 minutes.    [name] M.D on 08/09/2020 at 4:19 PM  Between 7am to 6pm - Pager - [phone]  After 6pm go to [email or web address] - password [email or web address]  [facility]  CC: Primary care physician;  Rusty Aus, MD

## 2020-09-04 DIAGNOSIS — E782 Mixed hyperlipidemia: Secondary | ICD-10-CM | POA: Diagnosis not present

## 2020-09-04 DIAGNOSIS — J1282 Pneumonia due to coronavirus disease 2019: Secondary | ICD-10-CM | POA: Diagnosis not present

## 2020-09-04 DIAGNOSIS — R1319 Other dysphagia: Secondary | ICD-10-CM | POA: Diagnosis not present

## 2020-09-04 DIAGNOSIS — J189 Pneumonia, unspecified organism: Secondary | ICD-10-CM | POA: Diagnosis not present

## 2020-09-04 DIAGNOSIS — U071 COVID-19: Secondary | ICD-10-CM | POA: Diagnosis not present

## 2020-09-04 DIAGNOSIS — K2100 Gastro-esophageal reflux disease with esophagitis, without bleeding: Secondary | ICD-10-CM | POA: Diagnosis not present

## 2020-09-04 DIAGNOSIS — F330 Major depressive disorder, recurrent, mild: Secondary | ICD-10-CM | POA: Diagnosis not present

## 2020-09-04 DIAGNOSIS — Z0000 Encounter for general adult medical examination without abnormal findings: Secondary | ICD-10-CM | POA: Diagnosis not present

## 2020-09-04 DIAGNOSIS — E559 Vitamin D deficiency, unspecified: Secondary | ICD-10-CM | POA: Diagnosis not present

## 2020-09-05 ENCOUNTER — Other Ambulatory Visit: Payer: Self-pay

## 2020-09-05 ENCOUNTER — Ambulatory Visit
Admission: RE | Admit: 2020-09-05 | Discharge: 2020-09-05 | Disposition: A | Discharge status: Home / Self Care | Payer: PPO | Source: Ambulatory Visit | Attending: Obstetrics & Gynecology | Admitting: Obstetrics & Gynecology

## 2020-09-05 DIAGNOSIS — Z1231 Encounter for screening mammogram for malignant neoplasm of breast: Secondary | ICD-10-CM | POA: Insufficient documentation

## 2020-09-09 DIAGNOSIS — R1310 Dysphagia, unspecified: Secondary | ICD-10-CM | POA: Diagnosis not present

## 2020-09-09 DIAGNOSIS — Z1211 Encounter for screening for malignant neoplasm of colon: Secondary | ICD-10-CM | POA: Diagnosis not present

## 2020-09-09 DIAGNOSIS — K219 Gastro-esophageal reflux disease without esophagitis: Secondary | ICD-10-CM | POA: Diagnosis not present

## 2020-09-30 DIAGNOSIS — J4540 Moderate persistent asthma, uncomplicated: Secondary | ICD-10-CM | POA: Diagnosis not present

## 2020-09-30 DIAGNOSIS — J45902 Unspecified asthma with status asthmaticus: Secondary | ICD-10-CM | POA: Diagnosis not present

## 2020-09-30 DIAGNOSIS — J40 Bronchitis, not specified as acute or chronic: Secondary | ICD-10-CM | POA: Diagnosis not present

## 2021-01-27 DIAGNOSIS — K222 Esophageal obstruction: Secondary | ICD-10-CM | POA: Diagnosis not present

## 2021-01-27 DIAGNOSIS — K640 First degree hemorrhoids: Secondary | ICD-10-CM | POA: Diagnosis not present

## 2021-01-27 DIAGNOSIS — Z1211 Encounter for screening for malignant neoplasm of colon: Secondary | ICD-10-CM | POA: Diagnosis not present

## 2021-01-27 DIAGNOSIS — R1310 Dysphagia, unspecified: Secondary | ICD-10-CM | POA: Diagnosis not present

## 2021-01-27 DIAGNOSIS — K219 Gastro-esophageal reflux disease without esophagitis: Secondary | ICD-10-CM | POA: Diagnosis not present

## 2021-01-27 DIAGNOSIS — K5730 Diverticulosis of large intestine without perforation or abscess without bleeding: Secondary | ICD-10-CM | POA: Diagnosis not present

## 2021-01-27 DIAGNOSIS — Z980 Intestinal bypass and anastomosis status: Secondary | ICD-10-CM | POA: Diagnosis not present

## 2021-03-12 DIAGNOSIS — K2100 Gastro-esophageal reflux disease with esophagitis, without bleeding: Secondary | ICD-10-CM | POA: Diagnosis not present

## 2021-03-12 DIAGNOSIS — E782 Mixed hyperlipidemia: Secondary | ICD-10-CM | POA: Diagnosis not present

## 2021-03-12 DIAGNOSIS — E538 Deficiency of other specified B group vitamins: Secondary | ICD-10-CM | POA: Diagnosis not present

## 2021-03-12 DIAGNOSIS — E559 Vitamin D deficiency, unspecified: Secondary | ICD-10-CM | POA: Diagnosis not present

## 2021-03-17 DIAGNOSIS — E782 Mixed hyperlipidemia: Secondary | ICD-10-CM | POA: Diagnosis not present

## 2021-03-17 DIAGNOSIS — I6521 Occlusion and stenosis of right carotid artery: Secondary | ICD-10-CM | POA: Diagnosis not present

## 2021-03-17 DIAGNOSIS — Z0000 Encounter for general adult medical examination without abnormal findings: Secondary | ICD-10-CM | POA: Diagnosis not present

## 2021-03-17 DIAGNOSIS — E538 Deficiency of other specified B group vitamins: Secondary | ICD-10-CM | POA: Diagnosis not present

## 2021-03-17 DIAGNOSIS — J4540 Moderate persistent asthma, uncomplicated: Secondary | ICD-10-CM | POA: Diagnosis not present

## 2021-03-18 DIAGNOSIS — I10 Essential (primary) hypertension: Secondary | ICD-10-CM | POA: Diagnosis not present

## 2021-03-18 DIAGNOSIS — I6523 Occlusion and stenosis of bilateral carotid arteries: Secondary | ICD-10-CM | POA: Diagnosis not present

## 2021-03-18 DIAGNOSIS — I6521 Occlusion and stenosis of right carotid artery: Secondary | ICD-10-CM | POA: Diagnosis not present

## 2021-03-18 DIAGNOSIS — E785 Hyperlipidemia, unspecified: Secondary | ICD-10-CM | POA: Diagnosis not present

## 2021-05-22 DIAGNOSIS — J209 Acute bronchitis, unspecified: Secondary | ICD-10-CM | POA: Diagnosis not present

## 2021-05-22 DIAGNOSIS — Z03818 Encounter for observation for suspected exposure to other biological agents ruled out: Secondary | ICD-10-CM | POA: Diagnosis not present

## 2021-05-22 DIAGNOSIS — J45901 Unspecified asthma with (acute) exacerbation: Secondary | ICD-10-CM | POA: Diagnosis not present

## 2021-05-27 ENCOUNTER — Other Ambulatory Visit: Payer: Self-pay | Admitting: Internal Medicine

## 2021-05-27 DIAGNOSIS — Z1231 Encounter for screening mammogram for malignant neoplasm of breast: Secondary | ICD-10-CM

## 2021-09-08 DIAGNOSIS — E782 Mixed hyperlipidemia: Secondary | ICD-10-CM | POA: Diagnosis not present

## 2021-09-08 DIAGNOSIS — E538 Deficiency of other specified B group vitamins: Secondary | ICD-10-CM | POA: Diagnosis not present

## 2021-09-15 DIAGNOSIS — F330 Major depressive disorder, recurrent, mild: Secondary | ICD-10-CM | POA: Diagnosis not present

## 2021-09-15 DIAGNOSIS — E538 Deficiency of other specified B group vitamins: Secondary | ICD-10-CM | POA: Diagnosis not present

## 2021-09-15 DIAGNOSIS — E782 Mixed hyperlipidemia: Secondary | ICD-10-CM | POA: Diagnosis not present

## 2021-09-15 DIAGNOSIS — J4540 Moderate persistent asthma, uncomplicated: Secondary | ICD-10-CM | POA: Diagnosis not present

## 2021-09-15 DIAGNOSIS — Z0000 Encounter for general adult medical examination without abnormal findings: Secondary | ICD-10-CM | POA: Diagnosis not present

## 2021-09-15 DIAGNOSIS — I200 Unstable angina: Secondary | ICD-10-CM | POA: Diagnosis not present

## 2021-09-15 DIAGNOSIS — R739 Hyperglycemia, unspecified: Secondary | ICD-10-CM | POA: Diagnosis not present

## 2021-09-16 DIAGNOSIS — R079 Chest pain, unspecified: Secondary | ICD-10-CM | POA: Diagnosis not present

## 2021-09-16 DIAGNOSIS — E782 Mixed hyperlipidemia: Secondary | ICD-10-CM | POA: Diagnosis not present

## 2021-09-16 DIAGNOSIS — R0609 Other forms of dyspnea: Secondary | ICD-10-CM | POA: Diagnosis not present

## 2021-09-16 DIAGNOSIS — R0789 Other chest pain: Secondary | ICD-10-CM | POA: Diagnosis not present

## 2021-09-16 DIAGNOSIS — I10 Essential (primary) hypertension: Secondary | ICD-10-CM | POA: Diagnosis not present

## 2021-09-29 ENCOUNTER — Ambulatory Visit
Admission: RE | Admit: 2021-09-29 | Discharge: 2021-09-29 | Disposition: A | Discharge status: Home / Self Care | Payer: PPO | Source: Ambulatory Visit | Attending: Internal Medicine | Admitting: Internal Medicine

## 2021-09-29 DIAGNOSIS — Z1231 Encounter for screening mammogram for malignant neoplasm of breast: Secondary | ICD-10-CM | POA: Insufficient documentation

## 2021-10-09 DIAGNOSIS — R079 Chest pain, unspecified: Secondary | ICD-10-CM | POA: Diagnosis not present

## 2021-10-09 DIAGNOSIS — I10 Essential (primary) hypertension: Secondary | ICD-10-CM | POA: Diagnosis not present

## 2021-10-09 DIAGNOSIS — R0609 Other forms of dyspnea: Secondary | ICD-10-CM | POA: Diagnosis not present

## 2021-10-18 DIAGNOSIS — W57XXXA Bitten or stung by nonvenomous insect and other nonvenomous arthropods, initial encounter: Secondary | ICD-10-CM | POA: Diagnosis not present

## 2021-10-18 DIAGNOSIS — S90562A Insect bite (nonvenomous), left ankle, initial encounter: Secondary | ICD-10-CM | POA: Diagnosis not present

## 2021-10-18 DIAGNOSIS — L089 Local infection of the skin and subcutaneous tissue, unspecified: Secondary | ICD-10-CM | POA: Diagnosis not present

## 2021-10-21 ENCOUNTER — Other Ambulatory Visit: Payer: Self-pay | Admitting: Family Medicine

## 2021-10-21 ENCOUNTER — Ambulatory Visit
Admission: RE | Admit: 2021-10-21 | Discharge: 2021-10-21 | Disposition: A | Discharge status: Home / Self Care | Payer: PPO | Source: Ambulatory Visit | Attending: Family Medicine | Admitting: Family Medicine

## 2021-10-21 DIAGNOSIS — R42 Dizziness and giddiness: Secondary | ICD-10-CM

## 2021-10-21 DIAGNOSIS — R55 Syncope and collapse: Secondary | ICD-10-CM | POA: Diagnosis not present

## 2021-10-21 DIAGNOSIS — W57XXXD Bitten or stung by nonvenomous insect and other nonvenomous arthropods, subsequent encounter: Secondary | ICD-10-CM | POA: Diagnosis not present

## 2021-10-21 DIAGNOSIS — R112 Nausea with vomiting, unspecified: Secondary | ICD-10-CM

## 2021-10-22 DIAGNOSIS — E860 Dehydration: Secondary | ICD-10-CM | POA: Diagnosis not present

## 2021-10-29 DIAGNOSIS — K7689 Other specified diseases of liver: Secondary | ICD-10-CM | POA: Diagnosis not present

## 2021-12-01 DIAGNOSIS — R0609 Other forms of dyspnea: Secondary | ICD-10-CM | POA: Diagnosis not present

## 2021-12-01 DIAGNOSIS — R079 Chest pain, unspecified: Secondary | ICD-10-CM | POA: Diagnosis not present

## 2021-12-01 DIAGNOSIS — I10 Essential (primary) hypertension: Secondary | ICD-10-CM | POA: Diagnosis not present

## 2021-12-01 DIAGNOSIS — E782 Mixed hyperlipidemia: Secondary | ICD-10-CM | POA: Diagnosis not present

## 2021-12-03 DIAGNOSIS — M5010 Cervical disc disorder with radiculopathy, unspecified cervical region: Secondary | ICD-10-CM | POA: Diagnosis not present

## 2021-12-03 DIAGNOSIS — H8110 Benign paroxysmal vertigo, unspecified ear: Secondary | ICD-10-CM | POA: Diagnosis not present

## 2021-12-03 DIAGNOSIS — C4441 Basal cell carcinoma of skin of scalp and neck: Secondary | ICD-10-CM | POA: Diagnosis not present

## 2021-12-05 DIAGNOSIS — L821 Other seborrheic keratosis: Secondary | ICD-10-CM | POA: Diagnosis not present

## 2021-12-05 DIAGNOSIS — D225 Melanocytic nevi of trunk: Secondary | ICD-10-CM | POA: Diagnosis not present

## 2021-12-05 DIAGNOSIS — D2262 Melanocytic nevi of left upper limb, including shoulder: Secondary | ICD-10-CM | POA: Diagnosis not present

## 2021-12-05 DIAGNOSIS — L728 Other follicular cysts of the skin and subcutaneous tissue: Secondary | ICD-10-CM | POA: Diagnosis not present

## 2021-12-05 DIAGNOSIS — D2271 Melanocytic nevi of right lower limb, including hip: Secondary | ICD-10-CM | POA: Diagnosis not present

## 2021-12-05 DIAGNOSIS — D2272 Melanocytic nevi of left lower limb, including hip: Secondary | ICD-10-CM | POA: Diagnosis not present

## 2021-12-05 DIAGNOSIS — L814 Other melanin hyperpigmentation: Secondary | ICD-10-CM | POA: Diagnosis not present

## 2021-12-05 DIAGNOSIS — D2261 Melanocytic nevi of right upper limb, including shoulder: Secondary | ICD-10-CM | POA: Diagnosis not present

## 2021-12-29 DIAGNOSIS — Z8249 Family history of ischemic heart disease and other diseases of the circulatory system: Secondary | ICD-10-CM | POA: Diagnosis not present

## 2021-12-29 DIAGNOSIS — I10 Essential (primary) hypertension: Secondary | ICD-10-CM | POA: Diagnosis not present

## 2021-12-29 DIAGNOSIS — E782 Mixed hyperlipidemia: Secondary | ICD-10-CM | POA: Diagnosis not present

## 2021-12-29 DIAGNOSIS — R0609 Other forms of dyspnea: Secondary | ICD-10-CM | POA: Diagnosis not present

## 2021-12-30 DIAGNOSIS — R42 Dizziness and giddiness: Secondary | ICD-10-CM | POA: Diagnosis not present

## 2021-12-30 DIAGNOSIS — H8112 Benign paroxysmal vertigo, left ear: Secondary | ICD-10-CM | POA: Diagnosis not present

## 2022-01-08 DIAGNOSIS — H8111 Benign paroxysmal vertigo, right ear: Secondary | ICD-10-CM | POA: Diagnosis not present

## 2022-01-19 DIAGNOSIS — M542 Cervicalgia: Secondary | ICD-10-CM | POA: Diagnosis not present

## 2022-01-19 DIAGNOSIS — H8111 Benign paroxysmal vertigo, right ear: Secondary | ICD-10-CM | POA: Diagnosis not present

## 2022-02-04 DIAGNOSIS — L7211 Pilar cyst: Secondary | ICD-10-CM | POA: Diagnosis not present

## 2022-02-04 DIAGNOSIS — R208 Other disturbances of skin sensation: Secondary | ICD-10-CM | POA: Diagnosis not present

## 2022-02-17 DIAGNOSIS — M542 Cervicalgia: Secondary | ICD-10-CM | POA: Diagnosis not present

## 2022-02-17 DIAGNOSIS — M546 Pain in thoracic spine: Secondary | ICD-10-CM | POA: Diagnosis not present

## 2022-02-28 ENCOUNTER — Inpatient Hospital Stay
Admission: EM | Admit: 2022-02-28 | Discharge: 2022-03-03 | DRG: 071 | Disposition: A | Discharge status: Home Health | Payer: PPO | Attending: Internal Medicine | Admitting: Internal Medicine

## 2022-02-28 ENCOUNTER — Other Ambulatory Visit: Payer: Self-pay

## 2022-02-28 ENCOUNTER — Emergency Department: Payer: PPO

## 2022-02-28 DIAGNOSIS — Z83719 Family history of colon polyps, unspecified: Secondary | ICD-10-CM | POA: Diagnosis not present

## 2022-02-28 DIAGNOSIS — I10 Essential (primary) hypertension: Secondary | ICD-10-CM | POA: Diagnosis present

## 2022-02-28 DIAGNOSIS — E871 Hypo-osmolality and hyponatremia: Secondary | ICD-10-CM | POA: Diagnosis not present

## 2022-02-28 DIAGNOSIS — G9341 Metabolic encephalopathy: Secondary | ICD-10-CM | POA: Diagnosis not present

## 2022-02-28 DIAGNOSIS — Z888 Allergy status to other drugs, medicaments and biological substances status: Secondary | ICD-10-CM | POA: Diagnosis not present

## 2022-02-28 DIAGNOSIS — I4891 Unspecified atrial fibrillation: Secondary | ICD-10-CM | POA: Diagnosis not present

## 2022-02-28 DIAGNOSIS — R4182 Altered mental status, unspecified: Secondary | ICD-10-CM | POA: Diagnosis not present

## 2022-02-28 DIAGNOSIS — S0003XA Contusion of scalp, initial encounter: Secondary | ICD-10-CM | POA: Diagnosis present

## 2022-02-28 DIAGNOSIS — Z803 Family history of malignant neoplasm of breast: Secondary | ICD-10-CM

## 2022-02-28 DIAGNOSIS — Z880 Allergy status to penicillin: Secondary | ICD-10-CM | POA: Diagnosis not present

## 2022-02-28 DIAGNOSIS — Z79899 Other long term (current) drug therapy: Secondary | ICD-10-CM | POA: Diagnosis not present

## 2022-02-28 DIAGNOSIS — E876 Hypokalemia: Secondary | ICD-10-CM | POA: Diagnosis not present

## 2022-02-28 DIAGNOSIS — K222 Esophageal obstruction: Secondary | ICD-10-CM | POA: Diagnosis present

## 2022-02-28 DIAGNOSIS — R0682 Tachypnea, not elsewhere classified: Secondary | ICD-10-CM | POA: Diagnosis present

## 2022-02-28 DIAGNOSIS — Z7982 Long term (current) use of aspirin: Secondary | ICD-10-CM | POA: Diagnosis not present

## 2022-02-28 DIAGNOSIS — F419 Anxiety disorder, unspecified: Secondary | ICD-10-CM | POA: Diagnosis present

## 2022-02-28 DIAGNOSIS — Z808 Family history of malignant neoplasm of other organs or systems: Secondary | ICD-10-CM

## 2022-02-28 DIAGNOSIS — K5732 Diverticulitis of large intestine without perforation or abscess without bleeding: Secondary | ICD-10-CM | POA: Diagnosis present

## 2022-02-28 DIAGNOSIS — K219 Gastro-esophageal reflux disease without esophagitis: Secondary | ICD-10-CM

## 2022-02-28 DIAGNOSIS — E785 Hyperlipidemia, unspecified: Secondary | ICD-10-CM

## 2022-02-28 DIAGNOSIS — R6510 Systemic inflammatory response syndrome (SIRS) of non-infectious origin without acute organ dysfunction: Secondary | ICD-10-CM | POA: Diagnosis present

## 2022-02-28 DIAGNOSIS — K449 Diaphragmatic hernia without obstruction or gangrene: Secondary | ICD-10-CM | POA: Diagnosis not present

## 2022-02-28 DIAGNOSIS — R000 Tachycardia, unspecified: Secondary | ICD-10-CM | POA: Diagnosis present

## 2022-02-28 DIAGNOSIS — D72829 Elevated white blood cell count, unspecified: Secondary | ICD-10-CM | POA: Diagnosis present

## 2022-02-28 DIAGNOSIS — F32A Depression, unspecified: Secondary | ICD-10-CM | POA: Diagnosis not present

## 2022-02-28 DIAGNOSIS — Z8249 Family history of ischemic heart disease and other diseases of the circulatory system: Secondary | ICD-10-CM

## 2022-02-28 DIAGNOSIS — Z833 Family history of diabetes mellitus: Secondary | ICD-10-CM

## 2022-02-28 DIAGNOSIS — G4733 Obstructive sleep apnea (adult) (pediatric): Secondary | ICD-10-CM | POA: Diagnosis not present

## 2022-02-28 DIAGNOSIS — R110 Nausea: Secondary | ICD-10-CM | POA: Diagnosis not present

## 2022-02-28 HISTORY — DX: Systemic inflammatory response syndrome (sirs) of non-infectious origin without acute organ dysfunction: R65.10

## 2022-02-28 LAB — CBC WITH DIFFERENTIAL/PLATELET
Abs Immature Granulocytes: 0.06 10*3/uL (ref 0.00–0.07)
Basophils Absolute: 0 10*3/uL (ref 0.0–0.1)
Basophils Relative: 0 %
Eosinophils Absolute: 0 10*3/uL (ref 0.0–0.5)
Eosinophils Relative: 0 %
HCT: 37.6 % (ref 36.0–46.0)
Hemoglobin: 12.7 g/dL (ref 12.0–15.0)
Immature Granulocytes: 0 %
Lymphocytes Relative: 14 %
Lymphs Abs: 2.2 10*3/uL (ref 0.7–4.0)
MCH: 29.7 pg (ref 26.0–34.0)
MCHC: 33.8 g/dL (ref 30.0–36.0)
MCV: 88.1 fL (ref 80.0–100.0)
Monocytes Absolute: 1.1 10*3/uL — ABNORMAL HIGH (ref 0.1–1.0)
Monocytes Relative: 7 %
Neutro Abs: 12 10*3/uL — ABNORMAL HIGH (ref 1.7–7.7)
Neutrophils Relative %: 79 %
Platelets: 253 10*3/uL (ref 150–400)
RBC: 4.27 MIL/uL (ref 3.87–5.11)
RDW: 14.6 % (ref 11.5–15.5)
WBC: 15.4 10*3/uL — ABNORMAL HIGH (ref 4.0–10.5)
nRBC: 0 % (ref 0.0–0.2)

## 2022-02-28 LAB — COMPREHENSIVE METABOLIC PANEL
ALT: 17 U/L (ref 0–44)
AST: 23 U/L (ref 15–41)
Albumin: 4.1 g/dL (ref 3.5–5.0)
Alkaline Phosphatase: 95 U/L (ref 38–126)
Anion gap: 9 (ref 5–15)
BUN: 11 mg/dL (ref 8–23)
CO2: 24 mmol/L (ref 22–32)
Calcium: 9.1 mg/dL (ref 8.9–10.3)
Chloride: 97 mmol/L — ABNORMAL LOW (ref 98–111)
Creatinine, Ser: 1.07 mg/dL — ABNORMAL HIGH (ref 0.44–1.00)
GFR, Estimated: 56 mL/min — ABNORMAL LOW (ref >60)
Glucose, Bld: 133 mg/dL — ABNORMAL HIGH (ref 70–99)
Potassium: 3.3 mmol/L — ABNORMAL LOW (ref 3.5–5.1)
Sodium: 130 mmol/L — ABNORMAL LOW (ref 135–145)
Total Bilirubin: 1.5 mg/dL — ABNORMAL HIGH (ref 0.3–1.2)
Total Protein: 7.3 g/dL (ref 6.5–8.1)

## 2022-02-28 LAB — URINALYSIS, ROUTINE W REFLEX MICROSCOPIC
Bacteria, UA: NONE SEEN
Glucose, UA: NEGATIVE mg/dL
Hgb urine dipstick: NEGATIVE
Ketones, ur: 20 mg/dL — AB
Leukocytes,Ua: NEGATIVE
Nitrite: NEGATIVE
Protein, ur: 100 mg/dL — AB
Specific Gravity, Urine: 1.032 — ABNORMAL HIGH (ref 1.005–1.030)
pH: 5 (ref 5.0–8.0)

## 2022-02-28 LAB — TROPONIN I (HIGH SENSITIVITY): Troponin I (High Sensitivity): 5 ng/L (ref <18)

## 2022-02-28 LAB — PROTIME-INR
INR: 1.1 (ref 0.8–1.2)
Prothrombin Time: 13.7 seconds (ref 11.4–15.2)

## 2022-02-28 MED ORDER — LORAZEPAM 2 MG/ML IJ SOLN
1.0000 mg | Freq: Once | INTRAMUSCULAR | Status: AC
  Administered 2022-02-28: 1 mg via INTRAVENOUS
  Filled 2022-02-28: qty 1

## 2022-02-28 MED ORDER — FAMOTIDINE 20 MG PO TABS
20.0000 mg | ORAL_TABLET | Freq: Every day | ORAL | Status: DC
  Administered 2022-03-02: 20 mg via ORAL
  Filled 2022-02-28 (×2): qty 1

## 2022-02-28 MED ORDER — METRONIDAZOLE 500 MG/100ML IV SOLN
500.0000 mg | Freq: Two times a day (BID) | INTRAVENOUS | Status: DC
  Administered 2022-03-01 – 2022-03-02 (×3): 500 mg via INTRAVENOUS
  Filled 2022-02-28 (×4): qty 100

## 2022-02-28 MED ORDER — ASPIRIN 81 MG PO TBEC
81.0000 mg | DELAYED_RELEASE_TABLET | Freq: Every day | ORAL | Status: DC
  Administered 2022-03-02 – 2022-03-03 (×2): 81 mg via ORAL
  Filled 2022-02-28 (×2): qty 1

## 2022-02-28 MED ORDER — METOPROLOL SUCCINATE ER 25 MG PO TB24
25.0000 mg | ORAL_TABLET | Freq: Every day | ORAL | Status: DC
  Administered 2022-03-02 – 2022-03-03 (×2): 25 mg via ORAL
  Filled 2022-02-28 (×2): qty 1

## 2022-02-28 MED ORDER — ONDANSETRON HCL 4 MG/2ML IJ SOLN: 4.0000 mg | Freq: Four times a day (QID) | INTRAMUSCULAR | Status: DC | PRN

## 2022-02-28 MED ORDER — TRAZODONE HCL 50 MG PO TABS: 25.0000 mg | ORAL_TABLET | Freq: Every evening | ORAL | Status: DC | PRN

## 2022-02-28 MED ORDER — ALBUTEROL SULFATE HFA 108 (90 BASE) MCG/ACT IN AERS: 2.0000 | INHALATION_SPRAY | Freq: Four times a day (QID) | RESPIRATORY_TRACT | Status: DC | PRN

## 2022-02-28 MED ORDER — VANCOMYCIN HCL IN DEXTROSE 1-5 GM/200ML-% IV SOLN
1000.0000 mg | Freq: Once | INTRAVENOUS | Status: AC
  Administered 2022-03-01: 1000 mg via INTRAVENOUS
  Filled 2022-02-28: qty 200

## 2022-02-28 MED ORDER — SODIUM CHLORIDE 0.9 % IV SOLN
2.0000 g | Freq: Once | INTRAVENOUS | Status: AC
  Administered 2022-03-01: 2 g via INTRAVENOUS
  Filled 2022-02-28: qty 12.5

## 2022-02-28 MED ORDER — ONDANSETRON HCL 4 MG PO TABS: 4.0000 mg | ORAL_TABLET | Freq: Four times a day (QID) | ORAL | Status: DC | PRN

## 2022-02-28 MED ORDER — ENOXAPARIN SODIUM 40 MG/0.4ML IJ SOSY: 40.0000 mg | PREFILLED_SYRINGE | INTRAMUSCULAR | Status: DC

## 2022-02-28 MED ORDER — HYDRALAZINE HCL 50 MG PO TABS
25.0000 mg | ORAL_TABLET | Freq: Two times a day (BID) | ORAL | Status: DC
  Administered 2022-03-01 – 2022-03-03 (×4): 25 mg via ORAL
  Filled 2022-02-28 (×4): qty 1

## 2022-02-28 MED ORDER — ACETAMINOPHEN 650 MG RE SUPP: 650.0000 mg | Freq: Four times a day (QID) | RECTAL | Status: DC | PRN

## 2022-02-28 MED ORDER — POTASSIUM CHLORIDE IN NACL 20-0.9 MEQ/L-% IV SOLN
INTRAVENOUS | Status: DC
  Filled 2022-02-28 (×4): qty 1000

## 2022-02-28 MED ORDER — ACETAMINOPHEN 325 MG PO TABS
650.0000 mg | ORAL_TABLET | Freq: Four times a day (QID) | ORAL | Status: DC | PRN
  Administered 2022-03-01: 650 mg via ORAL
  Filled 2022-02-28: qty 2

## 2022-02-28 MED ORDER — SODIUM CHLORIDE 0.9 % IV SOLN: Freq: Once | INTRAVENOUS | Status: AC

## 2022-02-28 MED ORDER — LORAZEPAM 0.5 MG PO TABS: 0.5000 mg | ORAL_TABLET | Freq: Three times a day (TID) | ORAL | Status: DC | PRN

## 2022-02-28 MED ORDER — ATORVASTATIN CALCIUM 20 MG PO TABS
20.0000 mg | ORAL_TABLET | Freq: Every day | ORAL | Status: DC
  Administered 2022-03-02 – 2022-03-03 (×2): 20 mg via ORAL
  Filled 2022-02-28 (×2): qty 1

## 2022-02-28 MED ORDER — MAGNESIUM HYDROXIDE 400 MG/5ML PO SUSP: 30.0000 mL | Freq: Every day | ORAL | Status: DC | PRN

## 2022-02-28 MED ORDER — LOPERAMIDE HCL 2 MG PO CAPS: 4.0000 mg | ORAL_CAPSULE | Freq: Three times a day (TID) | ORAL | Status: DC | PRN

## 2022-02-28 MED ORDER — SPIRONOLACTONE 25 MG PO TABS
25.0000 mg | ORAL_TABLET | Freq: Every day | ORAL | Status: DC
  Administered 2022-03-02 – 2022-03-03 (×2): 25 mg via ORAL
  Filled 2022-02-28 (×2): qty 1

## 2022-02-28 MED ORDER — SODIUM CHLORIDE 0.9 % IV SOLN: 2.0000 g | Freq: Once | INTRAVENOUS | Status: DC

## 2022-02-28 MED ORDER — VANCOMYCIN HCL IN DEXTROSE 1-5 GM/200ML-% IV SOLN: 1000.0000 mg | Freq: Once | INTRAVENOUS | Status: DC

## 2022-02-28 MED ORDER — CITALOPRAM HYDROBROMIDE 20 MG PO TABS
20.0000 mg | ORAL_TABLET | Freq: Every day | ORAL | Status: DC
  Administered 2022-03-02 – 2022-03-03 (×2): 20 mg via ORAL
  Filled 2022-02-28 (×2): qty 1

## 2022-02-28 NOTE — ED Triage Notes (Signed)
Pt from home with ams, last known normal was yesterday. Pt arrives with bruise to forehead, confused, fighting, alert to self only, yells out repeatedly "oh god, help me". Ems states normal mentation and vs enroute. Head of bed elevated, pt taken to ct by RN.

## 2022-02-28 NOTE — ED Notes (Signed)
Attempting to obtain temperature

## 2022-02-28 NOTE — ED Notes (Signed)
Pt removing all monitoring equipment and pulling at IV's. Pt extremely altered and unable to follow commands or direction. Mittens placed on patient to protect herself and maintain equipment. Pt moving around in bed very restlessly and RN frequently intervening to help patient and prevent her from hitting her head on side rails. For this reason, pads placed on bedrails to promote safety. Patient also has bed alarm in use. Bed is low and locked. Pt in slight reverse trendelenburg with knees elevated. Pt also transferred to room directly across from nurses station to enable RN to observe pt easier. Family remains at bedside with pt.   MD at bedside speaking with family and assessing patient.

## 2022-02-28 NOTE — ED Notes (Signed)
Pt increasingly restless in bed again. Requiring near constant redirection from nurse and family to keep pt legs in bed. All prior precautions remain in place. MD aware and placing orders.

## 2022-02-28 NOTE — ED Provider Notes (Incomplete)
[facility] Provider Note    Event Date/Time   First MD Initiated Contact with Patient 02/28/22 2122     (approximate)   History   Altered Mental Status   HPI  [name] is a 71 y.o. female who presents to the emergency department today because of concerns for altered mental status.  Daughter states that she had talked to her earlier in the day over the telephone and patient was at her baseline.  However when a family member checked on her this afternoon they found her to be altered.  Also noticed a bruise to her right forehead.  Patient herself is unable to give any significant history.  Daughter states that the patient did receive seasonal flu vaccine yesterday and did have a temperature of 100.5 at home prior to the altered status.   Physical Exam   Triage Vital Signs: ED Triage Vitals [02/28/22 2054]  Enc Vitals Group     BP      Pulse Rate (!) 110     Resp (!) 24     Temp      Temp src      SpO2 100 %     Weight      Height      Head Circumference      Peak Flow      Pain Score      Pain Loc      Pain Edu?      Excl. in [place]?     Most recent vital signs: Vitals:   02/28/22 2054  Pulse: (!) 110  Resp: (!) 24  SpO2: 100%   General: Awake, alert, not oriented. CV:  Good peripheral perfusion. Tachycardia. Resp:  Normal effort. Tachypnea. Abd:  No distention.     ED Results / Procedures / Treatments   Labs (all labs ordered are listed, but only abnormal results are displayed) Labs Reviewed  CBC WITH DIFFERENTIAL/PLATELET - Abnormal; Notable for the following components:      Result Value   WBC 15.4 (*)    Neutro Abs 12.0 (*)    Monocytes Absolute 1.1 (*)    All other components within normal limits  COMPREHENSIVE METABOLIC PANEL - Abnormal; Notable for the following components:   Sodium 130 (*)    Potassium 3.3 (*)    Chloride 97 (*)    Glucose, Bld 133 (*)    Creatinine, Ser 1.07 (*)    Total Bilirubin 1.5 (*)     GFR, Estimated 56 (*)    All other components within normal limits  URINALYSIS, ROUTINE W REFLEX MICROSCOPIC - Abnormal; Notable for the following components:   Color, Urine AMBER (*)    APPearance HAZY (*)    Specific Gravity, Urine 1.032 (*)    Bilirubin Urine SMALL (*)    Ketones, ur 20 (*)    Protein, ur 100 (*)    All other components within normal limits  CULTURE, BLOOD (ROUTINE X 2)  CULTURE, BLOOD (ROUTINE X 2)  CULTURE, BLOOD (ROUTINE X 2)  CULTURE, BLOOD (ROUTINE X 2)  PROTIME-INR  CK  LACTIC ACID, PLASMA  LACTIC ACID, PLASMA  HIV ANTIBODY (ROUTINE TESTING W REFLEX)  BASIC METABOLIC PANEL  CBC  LACTIC ACID, PLASMA  LACTIC ACID, PLASMA  PROTIME-INR  APTT  TROPONIN I (HIGH SENSITIVITY)  TROPONIN I (HIGH SENSITIVITY)     EKG  I, Nance Pear, attending physician, personally viewed and interpreted this EKG  EKG Time: 2051 Rate: 78 Rhythm:  sinus rhythm Axis: normal Intervals: qtc 469 QRS: narrow ST changes: no st elevation Impression: normal ekg    RADIOLOGY I independently interpreted and visualized the CXR. My interpretation: No pneumonia Radiology interpretation:  IMPRESSION:  No active disease.    I independently interpreted and visualized the CT head. My interpretation: No acute bleed Radiology interpretation:  IMPRESSION:  1. No acute intracranial pathology.  2. Small right frontal and parietal scalp contusion.    PROCEDURES:  Critical Care performed: No  Procedures   MEDICATIONS ORDERED IN ED: Medications - No data to display   IMPRESSION / MDM / [place] / ED COURSE  I reviewed the triage vital signs and the nursing notes.                              Differential diagnosis includes, but is not limited to, infection, intracranial process, anemia, electrolyte abnormality.  Patient's presentation is most consistent with acute presentation with potential threat to life or bodily function.  Patient presented to the  emergency department today because of concerns for altered mental status.  Patient does have evidence of head trauma with hematoma to her forehead.  FINAL CLINICAL IMPRESSION(S) / ED DIAGNOSES   Final diagnoses:  None     Rx / DC Orders   ED Discharge Orders     None        Note:  This document was prepared using Dragon voice recognition software and may include unintentional dictation errors.

## 2022-02-28 NOTE — H&P (Signed)
[place]   PATIENT NAME: [name]    MR#:  [id number]  DATE OF BIRTH:  [birth year]  DATE OF ADMISSION:  02/28/2022  PRIMARY CARE PHYSICIAN: Rusty Aus, MD   Patient is coming from: Home  REQUESTING/REFERRING PHYSICIAN: Nance Pear, MD  CHIEF COMPLAINT:   Chief Complaint  Patient presents with   Altered Mental Status    HISTORY OF PRESENT ILLNESS:  [name] is a 71 y.o. Caucasian female with medical history significant for hypertension dyslipidemia, esophageal stricture, IBS, sleep apnea, depression diverticulosis, who presented to the emergency room with acute onset of altered mental status with confusion and agitation.  She was noted to have a bruise on the right forehead that could be secondary to an unwitnessed fall.  She was very restless in the ER and was given a total of 2 mg of IV Ativan.  During the interview she was still very restless and trying to get out of bed.  Her daughter stated that she received seasonal flu vaccine yesterday and did have temperature of 100.5 at home without reported chills.  No reported dysuria, oliguria hematuria or frequency or urgency or flank pain.  No reported cough or wheezing or dyspnea.  The patient was a known historian due to her altered mental status and agitation.  ED Course: In the ER, heart rate was initially 110 with respiratory rate of 24 and she was afebrile here.  CMP revealed hypokalemia 3.3 and hyponatremia with creatinine 1.07, total bili 1.5 CK was 68 and high sensitive troponin I of 5.  Lactic acid was 1.1 and CBC showed leukocytosis 15.4 with neutrophilia.  The urinalysis showed 20 ketones 115 and specific gravity 1032 with only 0-5 WBCs and no bacteria.  Blood cultures were drawn.   EKG as reviewed by me : EKG showed atrial fibrillation with controlled ventricular  response with poor R wave progression and mild anterolateral and inferior ST segment elevation that is likely early  repolarization. Imaging: Portable chest x-ray showed no active disease. Noncontrasted head CT scan revealed small right frontal and parietal scalp contusion with no acute intracranial pathology.  The patient was given 1 mg of IV Ativan twice, IV cefepime and vancomycin and hydration with IV normal saline at 150 mL/h.  She will be admitted to a medical telemetry bed for further evaluation and management PAST MEDICAL HISTORY:   Past Medical History:  Diagnosis Date   Bradycardia    Chest pain    Constipation    Depression    Diverticulitis    Diverticulosis of colon    DOE (dyspnea on exertion)    DVT (deep venous thrombosis) (HCC)    Esophageal stricture    Hyperlipemia    Hypertension    IBS (irritable bowel syndrome)    Sleep apnea     PAST SURGICAL HISTORY:   Past Surgical History:  Procedure Laterality Date   APPENDECTOMY     BLOOD CLOT  2004   RIGHT LEG   CHOLECYSTECTOMY     COLON SURGERY     GALLBLADDER SURGERY  1540   NISSEN FUNDOPLICATION     PLANTAR FASCIA SURGERY  2007   sigmoid colectomy  10/21/2010   pelvic anastomosis   VAGINAL HYSTERECTOMY      SOCIAL HISTORY:   Social History   Tobacco Use   Smoking status: Never   Smokeless tobacco: Never  Substance Use Topics   Alcohol use: No    FAMILY HISTORY:  Family History  Problem Relation Age of Onset   Heart disease Mother    Crohn's disease Sister    Colon polyps Sister    Irritable bowel syndrome Brother    Brain cancer Father    Colitis Sister    Diabetes Sister    Breast cancer Cousin    Colon cancer Neg Hx     DRUG ALLERGIES:   Allergies  Allergen Reactions   Dicyclomine Other (See Comments)    REACTION: Choking   Lisinopril Other (See Comments)    REACTION: Choking   Losartan Shortness Of Breath   Ace Inhibitors Other (See Comments)    Unknown to patient   Penicillins Itching and Rash    REVIEW OF SYSTEMS:   ROS As per history of present illness. All pertinent systems  were reviewed above. Constitutional, HEENT, cardiovascular, respiratory, GI, GU, musculoskeletal, neuro, psychiatric, endocrine, integumentary and hematologic systems were reviewed and are otherwise negative/unremarkable except for positive findings mentioned above in the HPI.   MEDICATIONS AT HOME:   Prior to Admission medications   Medication Sig Start Date End Date Taking? Authorizing Provider  aspirin 81 MG tablet Take 1 tablet (81 mg total) by mouth daily. 10/27/17  Yes [facility], Tami Lin, PA  atorvastatin (LIPITOR) 20 MG tablet Take 20 mg by mouth daily.   Yes [provider]  citalopram (CELEXA) 20 MG tablet Take 20 mg by mouth daily. 04/05/19  Yes [provider]  famotidine (PEPCID) 20 MG tablet Take 20 mg by mouth at bedtime.  03/06/19  Yes [provider]  hydrALAZINE (APRESOLINE) 25 MG tablet Take 25 mg by mouth in the morning and at bedtime.   Yes [provider]  isosorbide mononitrate (IMDUR) 30 MG 24 hr tablet Take 30 mg by mouth daily. 02/23/22  Yes [provider]  metoprolol succinate (TOPROL-XL) 25 MG 24 hr tablet TAKE 1 TABLET BY MOUTH TWICE DAILY FOR 90 DAYS 02/15/18  Yes [provider]  spironolactone (ALDACTONE) 25 MG tablet Take 1 tablet (25 mg total) by mouth daily. 04/20/19 02/28/22 Yes Lendon Colonel, NP  albuterol (PROVENTIL HFA;VENTOLIN HFA) 108 (90 Base) MCG/ACT inhaler Inhale 2 puffs into the lungs every 6 (six) hours as needed for wheezing or shortness of breath. 11/16/17   [name], MD  loperamide (IMODIUM) 2 MG capsule Take 2 capsules (4 mg total) by mouth every 8 (eight) hours as needed for diarrhea or loose stools. 08/09/20   [name], MD  LORazepam (ATIVAN) 0.5 MG tablet Take 0.5 mg by mouth every 8 (eight) hours as needed. For anxiety    [provider]      VITAL SIGNS:  Blood pressure (!) 119/54, pulse 81, temperature 97.9 F (36.6 C), temperature source Oral, resp. rate (!)  24, SpO2 98 %.  PHYSICAL EXAMINATION:  Physical Exam  GENERAL:  71 y.o.-year-old Caucasian female patient lying in the bed, restless and agitated with global confusion. EYES: Pupils equal, round, reactive to light and accommodation. No scleral icterus. Extraocular muscles intact.  HEENT: Head atraumatic, normocephalic. Oropharynx and nasopharynx clear.  NECK:  Supple, no jugular venous distention. No thyroid enlargement, no tenderness.  LUNGS: Normal breath sounds bilaterally, no wheezing, rales,rhonchi or crepitation. No use of accessory muscles of respiration.  CARDIOVASCULAR: Regular rate and rhythm, S1, S2 normal. No murmurs, rubs, or gallops.  ABDOMEN: Soft, nondistended, nontender. Bowel sounds present. No organomegaly or mass.  EXTREMITIES: No pedal edema, cyanosis, or clubbing.  NEUROLOGIC: Cranial nerves II through  XII are intact. Muscle strength 5/5 in all extremities. Sensation intact. Gait not checked.  PSYCHIATRIC: The patient is somnolent and agitated with global confusion.  SKIN: Right forehead contusion with no other lesion, or ulcer.   LABORATORY PANEL:   CBC Recent Labs  Lab 02/28/22 2057  WBC 15.4*  HGB 12.7  HCT 37.6  PLT 253   ------------------------------------------------------------------------------------------------------------------  Chemistries  Recent Labs  Lab 02/28/22 2057  NA 130*  K 3.3*  CL 97*  CO2 24  GLUCOSE 133*  BUN 11  CREATININE 1.07*  CALCIUM 9.1  AST 23  ALT 17  ALKPHOS 95  BILITOT 1.5*   ------------------------------------------------------------------------------------------------------------------  Cardiac Enzymes No results for input(s): "TROPONINI" in the last 168 hours. ------------------------------------------------------------------------------------------------------------------  RADIOLOGY:  DG Chest Portable 1 View  Result Date: 02/28/2022 CLINICAL DATA:  Altered mental status. EXAM: PORTABLE CHEST 1 VIEW  COMPARISON:  Chest radiograph dated 03/08/2019. FINDINGS: No focal consolidation, pleural effusion pneumothorax. The cardiac silhouette is within limits. Atherosclerotic calcification of the aorta. No acute osseous pathology. IMPRESSION: No active disease. Electronically Signed   By: Anner Crete M.D.   On: 02/28/2022 22:57   CT HEAD WO CONTRAST (5MM)  Result Date: 02/28/2022 CLINICAL DATA:  Head trauma. EXAM: CT HEAD WITHOUT CONTRAST TECHNIQUE: Contiguous axial images were obtained from the base of the skull through the vertex without intravenous contrast. RADIATION DOSE REDUCTION: This exam was performed according to the departmental dose-optimization program which includes automated exposure control, adjustment of the mA and/or kV according to patient size and/or use of iterative reconstruction technique. COMPARISON:  Head CT dated 10/21/2021. FINDINGS: Brain: The ventricles and sulci are appropriate size for the patient's age. The gray-white matter discrimination is preserved. There is no acute intracranial hemorrhage. No mass effect or midline shift. No extra-axial fluid collection. Vascular: No hyperdense vessel or unexpected calcification. Skull: Normal. Negative for fracture or focal lesion. Sinuses/Orbits: No acute finding. Other: Small right frontal and parietal scalp contusion. IMPRESSION: 1. No acute intracranial pathology. 2. Small right frontal and parietal scalp contusion. Electronically Signed   By: Anner Crete M.D.   On: 02/28/2022 21:28      IMPRESSION AND PLAN:  Assessment and Plan: * SIRS (systemic inflammatory response syndrome) (HCC) - The patient has leukocytosis, tachycardia and tachypnea.  We will need to rule out sepsis. - She meets criteria for SIRS. - She will be admitted to a medical telemetry bed. - We will continue empiric antibiotic therapy with IV vancomycin, cefepime and Flagyl pending blood cultures results. - We will monitor mental status.   Acute  metabolic encephalopathy - This like secondary to #1. - We will place on as needed IM Haldol for now. - We will check ammonia level and TSH. - This could be associated with her depression.  Hypokalemia - We will replace potassium and check magnesium level.  Anxiety and depression - We will continue her Celexa and judiciously use Ativan as she was more agitated after it was given in the ER.  GERD without esophagitis - We will continue PPI therapy and H2 blocker therapy.  Dyslipidemia - We will continue statin therapy.       DVT prophylaxis: Lovenox.  Advanced Care Planning:  Code Status: full code.  Family Communication:  The plan of care was discussed in details with the patient (and family). I answered all questions. The patient agreed to proceed with the above mentioned plan. Further management will depend upon hospital course. Disposition Plan: Back to previous home environment  Consults called: none.  All the records are reviewed and case discussed with ED provider.  Status is: Inpatient    At the time of the admission, it appears that the appropriate admission status for this patient is inpatient.  This is judged to be reasonable and necessary in order to provide the required intensity of service to ensure the patient's safety given the presenting symptoms, physical exam findings and initial radiographic and laboratory data in the context of comorbid conditions.  The patient requires inpatient status due to high intensity of service, high risk of further deterioration and high frequency of surveillance required.  I certify that at the time of admission, it is my clinical judgment that the patient will require inpatient hospital care extending more than 2 midnights.                            Dispo: The patient is from: Home              Anticipated d/c is to: Home              Patient currently is not medically stable to d/c.              Difficult to place patient:  No  Christel Mormon M.D on 03/01/2022 at 1:18 AM  [facility]   From 7 PM-7 AM, contact night-coverage [email or web address]  CC: Primary care physician; Rusty Aus, MD

## 2022-02-28 NOTE — ED Provider Notes (Signed)
[facility] Provider Note    Event Date/Time   First MD Initiated Contact with Patient 02/28/22 2122     (approximate)   History   Altered Mental Status   HPI {Remember to add pertinent medical, surgical, social, and/or OB history to HPI:1} [name] is a 71 y.o. female  ***       Physical Exam   Triage Vital Signs: ED Triage Vitals [02/28/22 2054]  Enc Vitals Group     BP      Pulse Rate (!) 110     Resp (!) 24     Temp      Temp src      SpO2 100 %     Weight      Height      Head Circumference      Peak Flow      Pain Score      Pain Loc      Pain Edu?      Excl. in [place]?     Most recent vital signs: Vitals:   02/28/22 2054  Pulse: (!) 110  Resp: (!) 24  SpO2: 100%    {Only need to document appropriate and relevant physical exam:1} General: Awake, no distress. *** CV:  Good peripheral perfusion. *** Resp:  Normal effort. *** Abd:  No distention. *** Other:  ***   ED Results / Procedures / Treatments   Labs (all labs ordered are listed, but only abnormal results are displayed) Labs Reviewed  CBC WITH DIFFERENTIAL/PLATELET - Abnormal; Notable for the following components:      Result Value   WBC 15.4 (*)    Neutro Abs 12.0 (*)    Monocytes Absolute 1.1 (*)    All other components within normal limits  PROTIME-INR  COMPREHENSIVE METABOLIC PANEL  TROPONIN I (HIGH SENSITIVITY)     EKG  ***   RADIOLOGY *** {USE THE WORD "INTERPRETED"!! You MUST document your own interpretation of imaging, as well as the fact that you reviewed the radiologist's report!:1}   PROCEDURES:  Critical Care performed: {CriticalCareYesNo:19197::"Yes, see critical care procedure note(s)","No"}  Procedures   MEDICATIONS ORDERED IN ED: Medications - No data to display   IMPRESSION / MDM / [place] / ED COURSE  I reviewed the triage vital signs and the nursing notes.                              Differential  diagnosis includes, but is not limited to, ***  Patient's presentation is most consistent with {EM COPA:27473}  {If the patient is on the monitor, remove the brackets and asterisks on the sentence below and remember to document it as a Procedure as well. Otherwise delete the sentence below:1} {**The patient is on the cardiac monitor to evaluate for evidence of arrhythmia and/or significant heart rate changes.**} {Remember to include, when applicable, any/all of the following data: independent review of imaging independent review of labs (comment specifically on pertinent positives and negatives) review of specific prior hospitalizations, PCP/specialist notes, etc. discuss meds given and prescribed document any discussion with consultants (including hospitalists) any clinical decision tools you used and why (PECARN, NEXUS, etc.) did you consider admitting the patient? document social determinants of health affecting patient's care (homelessness, inability to follow up in a timely fashion, etc) document any pre-existing conditions increasing risk on current visit (e.g. diabetes and HTN increasing danger of high-risk chest pain/ACS) describes  what meds you gave (especially parenteral) and why any other interventions?:1}     FINAL CLINICAL IMPRESSION(S) / ED DIAGNOSES   Final diagnoses:  None     Rx / DC Orders   ED Discharge Orders     None        Note:  This document was prepared using Dragon voice recognition software and may include unintentional dictation errors.

## 2022-03-01 ENCOUNTER — Other Ambulatory Visit: Payer: Self-pay

## 2022-03-01 ENCOUNTER — Encounter: Payer: Self-pay | Admitting: Family Medicine

## 2022-03-01 ENCOUNTER — Inpatient Hospital Stay: Payer: PPO

## 2022-03-01 DIAGNOSIS — G9341 Metabolic encephalopathy: Secondary | ICD-10-CM | POA: Diagnosis not present

## 2022-03-01 DIAGNOSIS — K219 Gastro-esophageal reflux disease without esophagitis: Secondary | ICD-10-CM

## 2022-03-01 DIAGNOSIS — F419 Anxiety disorder, unspecified: Secondary | ICD-10-CM

## 2022-03-01 DIAGNOSIS — F32A Depression, unspecified: Secondary | ICD-10-CM

## 2022-03-01 DIAGNOSIS — E785 Hyperlipidemia, unspecified: Secondary | ICD-10-CM | POA: Diagnosis not present

## 2022-03-01 DIAGNOSIS — R6510 Systemic inflammatory response syndrome (SIRS) of non-infectious origin without acute organ dysfunction: Secondary | ICD-10-CM

## 2022-03-01 DIAGNOSIS — E876 Hypokalemia: Secondary | ICD-10-CM | POA: Diagnosis not present

## 2022-03-01 HISTORY — DX: Metabolic encephalopathy: G93.41

## 2022-03-01 LAB — BASIC METABOLIC PANEL
Anion gap: 7 (ref 5–15)
BUN: 10 mg/dL (ref 8–23)
CO2: 22 mmol/L (ref 22–32)
Calcium: 8.4 mg/dL — ABNORMAL LOW (ref 8.9–10.3)
Chloride: 103 mmol/L (ref 98–111)
Creatinine, Ser: 0.83 mg/dL (ref 0.44–1.00)
GFR, Estimated: >60 mL/min (ref >60)
Glucose, Bld: 101 mg/dL — ABNORMAL HIGH (ref 70–99)
Potassium: 4.2 mmol/L (ref 3.5–5.1)
Sodium: 132 mmol/L — ABNORMAL LOW (ref 135–145)

## 2022-03-01 LAB — PROTIME-INR
INR: 1.2 (ref 0.8–1.2)
Prothrombin Time: 14.7 seconds (ref 11.4–15.2)

## 2022-03-01 LAB — BLOOD GAS, ARTERIAL
Acid-base deficit: 0.3 mmol/L (ref 0.0–2.0)
Bicarbonate: 24.1 mmol/L (ref 20.0–28.0)
O2 Saturation: 98 %
Patient temperature: 37
pCO2 arterial: 38 mmHg (ref 32–48)
pH, Arterial: 7.41 (ref 7.35–7.45)
pO2, Arterial: 71 mmHg — ABNORMAL LOW (ref 83–108)

## 2022-03-01 LAB — CBC
HCT: 34.8 % — ABNORMAL LOW (ref 36.0–46.0)
Hemoglobin: 11.5 g/dL — ABNORMAL LOW (ref 12.0–15.0)
MCH: 29.4 pg (ref 26.0–34.0)
MCHC: 33 g/dL (ref 30.0–36.0)
MCV: 89 fL (ref 80.0–100.0)
Platelets: 208 10*3/uL (ref 150–400)
RBC: 3.91 MIL/uL (ref 3.87–5.11)
RDW: 14.4 % (ref 11.5–15.5)
WBC: 12.1 10*3/uL — ABNORMAL HIGH (ref 4.0–10.5)
nRBC: 0 % (ref 0.0–0.2)

## 2022-03-01 LAB — D-DIMER, QUANTITATIVE: D-Dimer, Quant: 8.86 ug/mL-FEU — ABNORMAL HIGH (ref 0.00–0.50)

## 2022-03-01 LAB — LACTIC ACID, PLASMA
Lactic Acid, Venous: 0.8 mmol/L (ref 0.5–1.9)
Lactic Acid, Venous: 1.1 mmol/L (ref 0.5–1.9)
Lactic Acid, Venous: 1.1 mmol/L (ref 0.5–1.9)

## 2022-03-01 LAB — TROPONIN I (HIGH SENSITIVITY): Troponin I (High Sensitivity): 5 ng/L (ref <18)

## 2022-03-01 LAB — HIV ANTIBODY (ROUTINE TESTING W REFLEX): HIV Screen 4th Generation wRfx: NONREACTIVE

## 2022-03-01 LAB — APTT: aPTT: 29 seconds (ref 24–36)

## 2022-03-01 LAB — CK: Total CK: 68 U/L (ref 38–234)

## 2022-03-01 MED ORDER — VANCOMYCIN HCL IN DEXTROSE 1-5 GM/200ML-% IV SOLN
1000.0000 mg | INTRAVENOUS | Status: DC
  Administered 2022-03-01 – 2022-03-02 (×2): 1000 mg via INTRAVENOUS
  Filled 2022-03-01 (×2): qty 200

## 2022-03-01 MED ORDER — HALOPERIDOL LACTATE 5 MG/ML IJ SOLN: 1.0000 mg | Freq: Four times a day (QID) | INTRAMUSCULAR | Status: DC | PRN

## 2022-03-01 MED ORDER — SODIUM CHLORIDE 0.9 % IV SOLN
2.0000 g | Freq: Two times a day (BID) | INTRAVENOUS | Status: DC
  Administered 2022-03-01 (×2): 2 g via INTRAVENOUS
  Filled 2022-03-01 (×3): qty 12.5

## 2022-03-01 MED ORDER — HALOPERIDOL LACTATE 5 MG/ML IJ SOLN
INTRAMUSCULAR | Status: AC
  Administered 2022-03-01: 2 mg via INTRAMUSCULAR
  Filled 2022-03-01: qty 1

## 2022-03-01 MED ORDER — FENTANYL CITRATE PF 50 MCG/ML IJ SOSY
25.0000 ug | PREFILLED_SYRINGE | Freq: Once | INTRAMUSCULAR | Status: AC
  Administered 2022-03-01: 25 ug via INTRAVENOUS
  Filled 2022-03-01: qty 1

## 2022-03-01 MED ORDER — DIPHENHYDRAMINE HCL 50 MG/ML IJ SOLN
25.0000 mg | Freq: Once | INTRAMUSCULAR | Status: DC
  Filled 2022-03-01: qty 1

## 2022-03-01 MED ORDER — ALBUTEROL SULFATE (2.5 MG/3ML) 0.083% IN NEBU: 2.5000 mg | INHALATION_SOLUTION | Freq: Four times a day (QID) | RESPIRATORY_TRACT | Status: DC | PRN

## 2022-03-01 MED ORDER — ENOXAPARIN SODIUM 40 MG/0.4ML IJ SOSY
40.0000 mg | PREFILLED_SYRINGE | INTRAMUSCULAR | Status: DC
  Administered 2022-03-01 – 2022-03-02 (×2): 40 mg via SUBCUTANEOUS
  Filled 2022-03-01 (×2): qty 0.4

## 2022-03-01 MED ORDER — ORAL CARE MOUTH RINSE: 15.0000 mL | OROMUCOSAL | Status: DC | PRN

## 2022-03-01 MED ORDER — DIAZEPAM 5 MG/ML IJ SOLN
2.5000 mg | Freq: Once | INTRAMUSCULAR | Status: DC
  Filled 2022-03-01: qty 2

## 2022-03-01 MED ORDER — IOHEXOL 350 MG/ML SOLN
75.0000 mL | Freq: Once | INTRAVENOUS | Status: AC | PRN
  Administered 2022-03-01: 75 mL via INTRAVENOUS

## 2022-03-01 NOTE — ED Notes (Signed)
Pt resting with no acute distress.

## 2022-03-01 NOTE — ED Notes (Signed)
Patient daughter took pt purse and dentures home with her.

## 2022-03-01 NOTE — Assessment & Plan Note (Signed)
-   We will continue statin therapy. 

## 2022-03-01 NOTE — Progress Notes (Signed)
Pharmacy Antibiotic Note  [name] is a 71 y.o. female admitted on 02/28/2022 with infection of unknown source.  Pharmacy has been consulted for Cefepime & Vancomycin dosing x 7 days.  Plan: Cefepime 2 gm q12hr per indication & renal fxn.  Pt given Vancomycin 1000 mg once. Vancomycin 1000 mg IV Q 24 hrs. Goal AUC 400-550. Expected AUC: 524.3 SCr used: 1.07  Pharmacy will continue to follow and will adjust abx dosing whenever warranted.  Temp (24hrs), Avg:97.9 F (36.6 C), Min:97.9 F (36.6 C), Max:97.9 F (36.6 C)   Recent Labs  Lab 02/28/22 2057  WBC 15.4*  CREATININE 1.07*    CrCl cannot be calculated (Unknown ideal weight.).    Allergies  Allergen Reactions   Dicyclomine Other (See Comments)    REACTION: Choking   Lisinopril Other (See Comments)    REACTION: Choking   Losartan Shortness Of Breath   Ace Inhibitors Other (See Comments)    Unknown to patient   Penicillins Itching and Rash    Antimicrobials this admission: 10/22 Cefepime >> x 7 days 10/22 Vancomycin >> x 7 days 10/22 Flagyl >> x 7 days  Microbiology results: 10/22 BCx: Pending  Thank you for allowing pharmacy to be a part of this patient's care.  Renda Rolls, PharmD, [facility] 03/01/2022 12:09 AM

## 2022-03-01 NOTE — Progress Notes (Signed)
PROGRESS NOTE    [name]  BJY:[id number]  DOB: November 08, 1950  DOA: 02/28/2022 PCP: [name], MD Outpatient Specialists:   Hospital course:  71 year old female with HTN, diverticulitis, OSA and esophageal stricture was admitted yesterday with acute metabolic encephalopathy of unclear etiology.  Work-up was notable for leukocytosis, normal UA, lactic acid of 1.1 and head CT without any acute intracranial pathology.  It was started on cefepime and vancomycin with fluid resuscitation per sepsis protocol   Subjective:  Patient herself is asleep but wakes to voice and is conversant but is quickly to fall asleep again.  Patient was seen with daughter and son-in-law at bedside.  They both note that she is much improved from yesterday.  They note "I could not wake her up at all yesterday".  Patient specifically denies any abdominal pain.  Daughter notes that she did have nausea and vomiting yesterday but they are unaware of any diarrhea.   Objective: Vitals:   03/01/22 1000 03/01/22 1148 03/01/22 1400 03/01/22 1600  BP: (!) 119/53  (!) 126/52 (!) 149/62  Pulse: 72  73 72  Resp: (!) 27  (!) 4 19  Temp:  98.6 F (37 C) 98.4 F (36.9 C) 98.4 F (36.9 C)  TempSrc:  Oral    SpO2: 98%  97% 98%  Weight:      Height:        Intake/Output Summary (Last 24 hours) at 03/01/2022 1707 Last data filed at 03/01/2022 1120 Gross per 24 hour  Intake 1764.85 ml  Output --  Net 1764.85 ml   Filed Weights   03/01/22 0421  Weight: 75.6 kg     Exam:  General: Patient initially lethargic but wakes to voice alone, he has mitts on her hands bilaterally Eyes: sclera anicteric, conjuctiva mild injection bilaterally CVS: S1-S2, regular  Respiratory:  decreased air entry bilaterally secondary to decreased inspiratory effort, rales at bases  GI: NABS, soft, NT, no tenderness to light or deep palpation in any of the quadrants specifically no LLQ tenderness.  No focal rebound. LE: No  edema.  Neuro: GCS is 13, opens eyes to sound, patient is confused verbal response and she does obey commands   Assessment & Plan:   Acute metabolic encephalopathy secondary to SIRS/sepsis Patient with meets sepsis criteria for he had a source which we do not at this point However she is improved with empiric treatment with vancomycin cefepime Flagyl and IV fluid resuscitation which I will continue Abdominal exam is entirely benign to my exam however given history of diverticulitis, patient might benefit from a chest/abdominal and pelvic CT when she is more able to cooperate.  Hypokalemia Repleted and resolved  Hyponatremia Improving with fluid resuscitation Of note patient is on Celexa, this may be contributory  GERD Continue IV PPI    Scheduled Meds:  aspirin EC  81 mg Oral Daily   atorvastatin  20 mg Oral Daily   citalopram  20 mg Oral Daily   diphenhydrAMINE  25 mg Intravenous Once   enoxaparin (LOVENOX) injection  40 mg Subcutaneous Q24H   [START ON 03/02/2022] famotidine  20 mg Oral QHS   hydrALAZINE  25 mg Oral BID   metoprolol succinate  25 mg Oral Daily   spironolactone  25 mg Oral Daily   Continuous Infusions:  0.9 % NaCl with KCl 20 mEq / L 100 mL/hr at 03/01/22 1120   ceFEPime (MAXIPIME) IV Stopped (03/01/22 1142)   metronidazole Stopped (03/01/22 1408)   vancomycin  Stopped (03/01/22 1039)    Data Reviewed:  Basic Metabolic Panel: Recent Labs  Lab 02/28/22 2057 03/01/22 0421  NA 130* 132*  K 3.3* 4.2  CL 97* 103  CO2 24 22  GLUCOSE 133* 101*  BUN 11 10  CREATININE 1.07* 0.83  CALCIUM 9.1 8.4*    CBC: Recent Labs  Lab 02/28/22 2057 03/01/22 0421  WBC 15.4* 12.1*  NEUTROABS 12.0*  --   HGB 12.7 11.5*  HCT 37.6 34.8*  MCV 88.1 89.0  PLT 253 208    Studies: CT Angio Chest Pulmonary Embolism (PE) W or WO Contrast  Result Date: 03/01/2022 CLINICAL DATA:  Pulmonary embolism suspected, high probability EXAM: CT ANGIOGRAPHY CHEST WITH  CONTRAST TECHNIQUE: Multidetector CT imaging of the chest was performed using the standard protocol during bolus administration of intravenous contrast. Multiplanar CT image reconstructions and MIPs were obtained to evaluate the vascular anatomy. RADIATION DOSE REDUCTION: This exam was performed according to the departmental dose-optimization program which includes automated exposure control, adjustment of the mA and/or kV according to patient size and/or use of iterative reconstruction technique. CONTRAST:  75mL OMNIPAQUE IOHEXOL 350 MG/ML SOLN COMPARISON:  03/07/2019 FINDINGS: Cardiovascular: Satisfactory opacification of the pulmonary arteries to the segmental level. No evidence of pulmonary embolism. Normal heart size. No pericardial effusion. Coronary and aortic atheromatous calcification Mediastinum/Nodes: Tiny sliding hiatal hernia. Prior surgery at the esophageal hiatus. Negative for adenopathy Lungs/Pleura: There is no edema, consolidation, effusion, or pneumothorax. Upper Abdomen: Cholecystectomy. Two left upper pole renal calculi measuring up to 4 mm. No acute finding Musculoskeletal: Thoracic spondylosis.  No acute finding Review of the MIP images confirms the above findings. IMPRESSION: 1. Negative for pulmonary embolism or other acute finding. 2. Atherosclerosis, including the coronary arteries. Electronically Signed   By: Tiburcio Pea M.D.   On: 03/01/2022 05:49   DG Chest Portable 1 View  Result Date: 02/28/2022 CLINICAL DATA:  Altered mental status. EXAM: PORTABLE CHEST 1 VIEW COMPARISON:  Chest radiograph dated 03/08/2019. FINDINGS: No focal consolidation, pleural effusion pneumothorax. The cardiac silhouette is within limits. Atherosclerotic calcification of the aorta. No acute osseous pathology. IMPRESSION: No active disease. Electronically Signed   By: Elgie Collard M.D.   On: 02/28/2022 22:57   CT HEAD WO CONTRAST ( )  Result Date: 02/28/2022 CLINICAL DATA:  Head trauma. EXAM:  CT HEAD WITHOUT CONTRAST TECHNIQUE: Contiguous axial images were obtained from the base of the skull through the vertex without intravenous contrast. RADIATION DOSE REDUCTION: This exam was performed according to the departmental dose-optimization program which includes automated exposure control, adjustment of the mA and/or kV according to patient size and/or use of iterative reconstruction technique. COMPARISON:  Head CT dated 10/21/2021. FINDINGS: Brain: The ventricles and sulci are appropriate size for the patient's age. The gray-white matter discrimination is preserved. There is no acute intracranial hemorrhage. No mass effect or midline shift. No extra-axial fluid collection. Vascular: No hyperdense vessel or unexpected calcification. Skull: Normal. Negative for fracture or focal lesion. Sinuses/Orbits: No acute finding. Other: Small right frontal and parietal scalp contusion. IMPRESSION: 1. No acute intracranial pathology. 2. Small right frontal and parietal scalp contusion. Electronically Signed   By: Elgie Collard M.D.   On: 02/28/2022 21:28    Principal Problem:   SIRS (systemic inflammatory response syndrome) (HCC) Active Problems:   Acute metabolic encephalopathy   Hypokalemia   Anxiety and depression   Dyslipidemia   GERD without esophagitis     Laycee Fitzsimmons Orma Flaming, [facility]  If 7PM-7AM,  please contact night-coverage [email or web address]   LOS: 1 day

## 2022-03-01 NOTE — Assessment & Plan Note (Addendum)
-   We will continue PPI therapy and H2 blocker therapy. 

## 2022-03-01 NOTE — Assessment & Plan Note (Signed)
-   This like secondary to #1. - We will place on as needed IM Haldol for now. - We will check ammonia level and TSH. - This could be associated with her depression.

## 2022-03-01 NOTE — Assessment & Plan Note (Signed)
-   We will continue her Celexa and judiciously use Ativan as she was more agitated after it was given in the ER.

## 2022-03-01 NOTE — Plan of Care (Signed)
  Problem: Fluid Volume: Goal: Hemodynamic stability will improve Outcome: Progressing   Problem: Clinical Measurements: Goal: Diagnostic test results will improve Outcome: Progressing Goal: Signs and symptoms of infection will decrease Outcome: Progressing   Problem: Respiratory: Goal: Ability to maintain adequate ventilation will improve Outcome: Progressing   Problem: Education: Goal: Knowledge of General Education information will improve Description: Including pain rating scale, medication(s)/side effects and non-pharmacologic comfort measures Outcome: Progressing   Problem: Health Behavior/Discharge Planning: Goal: Ability to manage health-related needs will improve Outcome: Progressing   Problem: Clinical Measurements: Goal: Ability to maintain clinical measurements within normal limits will improve Outcome: Progressing Goal: Will remain free from infection Outcome: Progressing Goal: Diagnostic test results will improve Outcome: Progressing Goal: Respiratory complications will improve Outcome: Progressing Goal: Cardiovascular complication will be avoided Outcome: Progressing   Problem: Activity: Goal: Risk for activity intolerance will decrease Outcome: Progressing   Problem: Nutrition: Goal: Adequate nutrition will be maintained Outcome: Progressing   Problem: Coping: Goal: Level of anxiety will decrease Outcome: Progressing   Problem: Elimination: Goal: Will not experience complications related to bowel motility Outcome: Progressing Goal: Will not experience complications related to urinary retention Outcome: Progressing   Problem: Pain Managment: Goal: General experience of comfort will improve Outcome: Progressing   Problem: Safety: Goal: Ability to remain free from injury will improve Outcome: Progressing   Problem: Skin Integrity: Goal: Risk for impaired skin integrity will decrease Outcome: Progressing   Problem: Education: Goal:  Knowledge of General Education information will improve Description: Including pain rating scale, medication(s)/side effects and non-pharmacologic comfort measures Outcome: Progressing   Problem: Health Behavior/Discharge Planning: Goal: Ability to manage health-related needs will improve Outcome: Progressing   Problem: Clinical Measurements: Goal: Ability to maintain clinical measurements within normal limits will improve Outcome: Progressing Goal: Will remain free from infection Outcome: Progressing Goal: Diagnostic test results will improve Outcome: Progressing Goal: Respiratory complications will improve Outcome: Progressing Goal: Cardiovascular complication will be avoided Outcome: Progressing   Problem: Activity: Goal: Risk for activity intolerance will decrease Outcome: Progressing   Problem: Nutrition: Goal: Adequate nutrition will be maintained Outcome: Progressing   Problem: Coping: Goal: Level of anxiety will decrease Outcome: Progressing   Problem: Elimination: Goal: Will not experience complications related to bowel motility Outcome: Progressing Goal: Will not experience complications related to urinary retention Outcome: Progressing   Problem: Pain Managment: Goal: General experience of comfort will improve Outcome: Progressing   Problem: Safety: Goal: Ability to remain free from injury will improve Outcome: Progressing   Problem: Skin Integrity: Goal: Risk for impaired skin integrity will decrease Outcome: Progressing

## 2022-03-01 NOTE — ED Notes (Signed)
Advised nurse that patient has ready bed 

## 2022-03-01 NOTE — Assessment & Plan Note (Signed)
-  We will replace potassium and check magnesium level. 

## 2022-03-01 NOTE — Progress Notes (Signed)
Patient A&Ox3. She is calm and cooperative. Mittens removed upon arrival to 1C from the ED. Sitter order discontinued.

## 2022-03-01 NOTE — Assessment & Plan Note (Signed)
-   The patient has leukocytosis, tachycardia and tachypnea.  We will need to rule out sepsis. - She meets criteria for SIRS. - She will be admitted to a medical telemetry bed. - We will continue empiric antibiotic therapy with IV vancomycin, cefepime and Flagyl pending blood cultures results. - We will monitor mental status.

## 2022-03-02 DIAGNOSIS — R6510 Systemic inflammatory response syndrome (SIRS) of non-infectious origin without acute organ dysfunction: Secondary | ICD-10-CM | POA: Diagnosis not present

## 2022-03-02 LAB — CBC
HCT: 33.4 % — ABNORMAL LOW (ref 36.0–46.0)
Hemoglobin: 11.2 g/dL — ABNORMAL LOW (ref 12.0–15.0)
MCH: 29.9 pg (ref 26.0–34.0)
MCHC: 33.5 g/dL (ref 30.0–36.0)
MCV: 89.3 fL (ref 80.0–100.0)
Platelets: 183 10*3/uL (ref 150–400)
RBC: 3.74 MIL/uL — ABNORMAL LOW (ref 3.87–5.11)
RDW: 14.8 % (ref 11.5–15.5)
WBC: 7.1 10*3/uL (ref 4.0–10.5)
nRBC: 0 % (ref 0.0–0.2)

## 2022-03-02 LAB — COMPREHENSIVE METABOLIC PANEL
ALT: 17 U/L (ref 0–44)
AST: 27 U/L (ref 15–41)
Albumin: 3.2 g/dL — ABNORMAL LOW (ref 3.5–5.0)
Alkaline Phosphatase: 71 U/L (ref 38–126)
Anion gap: 6 (ref 5–15)
BUN: 9 mg/dL (ref 8–23)
CO2: 22 mmol/L (ref 22–32)
Calcium: 8.5 mg/dL — ABNORMAL LOW (ref 8.9–10.3)
Chloride: 108 mmol/L (ref 98–111)
Creatinine, Ser: 0.86 mg/dL (ref 0.44–1.00)
GFR, Estimated: >60 mL/min (ref >60)
Glucose, Bld: 77 mg/dL (ref 70–99)
Potassium: 3.9 mmol/L (ref 3.5–5.1)
Sodium: 136 mmol/L (ref 135–145)
Total Bilirubin: 0.7 mg/dL (ref 0.3–1.2)
Total Protein: 5.8 g/dL — ABNORMAL LOW (ref 6.5–8.1)

## 2022-03-02 LAB — TSH: TSH: 3.099 u[IU]/mL (ref 0.350–4.500)

## 2022-03-02 LAB — VITAMIN B12: Vitamin B-12: 534 pg/mL (ref 180–914)

## 2022-03-02 LAB — PROCALCITONIN: Procalcitonin: <0.1 ng/mL

## 2022-03-02 LAB — AMMONIA: Ammonia: 16 umol/L (ref 9–35)

## 2022-03-02 MED ORDER — ACETAMINOPHEN 325 MG PO TABS: 650.0000 mg | ORAL_TABLET | Freq: Four times a day (QID) | ORAL | Status: DC | PRN

## 2022-03-02 MED ORDER — METOPROLOL TARTRATE 5 MG/5ML IV SOLN: 5.0000 mg | INTRAVENOUS | Status: DC | PRN

## 2022-03-02 MED ORDER — SENNOSIDES-DOCUSATE SODIUM 8.6-50 MG PO TABS: 1.0000 | ORAL_TABLET | Freq: Every evening | ORAL | Status: DC | PRN

## 2022-03-02 MED ORDER — IPRATROPIUM-ALBUTEROL 0.5-2.5 (3) MG/3ML IN SOLN: 3.0000 mL | RESPIRATORY_TRACT | Status: DC | PRN

## 2022-03-02 MED ORDER — SODIUM CHLORIDE 0.9 % IV SOLN: INTRAVENOUS | Status: AC

## 2022-03-02 MED ORDER — HYDRALAZINE HCL 20 MG/ML IJ SOLN: 10.0000 mg | INTRAMUSCULAR | Status: DC | PRN

## 2022-03-02 MED ORDER — GUAIFENESIN 100 MG/5ML PO LIQD: 5.0000 mL | ORAL | Status: DC | PRN

## 2022-03-02 MED ORDER — ONDANSETRON HCL 4 MG/2ML IJ SOLN: 4.0000 mg | Freq: Four times a day (QID) | INTRAMUSCULAR | Status: DC | PRN

## 2022-03-02 MED ORDER — TRAZODONE HCL 50 MG PO TABS: 50.0000 mg | ORAL_TABLET | Freq: Every evening | ORAL | Status: DC | PRN

## 2022-03-02 MED ORDER — ISOSORBIDE MONONITRATE ER 30 MG PO TB24
30.0000 mg | ORAL_TABLET | Freq: Every day | ORAL | Status: DC
  Administered 2022-03-02 – 2022-03-03 (×2): 30 mg via ORAL
  Filled 2022-03-02 (×2): qty 1

## 2022-03-02 NOTE — Evaluation (Signed)
Occupational Therapy Evaluation Patient Details Name: [name] MRN: [id number] DOB: [birth year] Today's Date: 03/02/2022   History of Present Illness Pt is a 71 y/o F admitted on 02/28/22 for tx of acute metabolic encephalopathy without clear etiology. PMH: HTN, diverticulitis, OSA, esophageal stricture   Clinical Impression   [name] presents to OT at her baseline level of functioning.  Prior to admission, patient lived alone and was able to complete ADLs/IADLs independently.  Currently, patient is able to complete basic ADLs with modified independence using rolling walker.  OT provided supervision assist for patient to complete lower body dressing, sit to stand transfers, and ambulation around nursing station/hallway using rolling walker.  Patient denies any new concerns re: self care completion.  OT provided education re: OT role and plan of care, strategies to prevent falls at home, and discharge recommendations.  Patient does not require ongoing skilled OT services in acute setting, with no anticipated needs for follow up OT after discharge.  Will sign off on this patient, please re-consult if patient has change in functional status or additional OT-related needs arise.      Recommendations for follow up therapy are one component of a multi-disciplinary discharge planning process, led by the attending physician.  Recommendations may be updated based on patient status, additional functional criteria and insurance authorization.   Follow Up Recommendations  No OT follow up    Assistance Recommended at Discharge PRN  Patient can return home with the following Assist for transportation    Functional Status Assessment  Patient has not had a recent decline in their functional status  Equipment Recommendations  Tub/shower bench    Recommendations for Other Services       Precautions / Restrictions Precautions Precautions: Fall Restrictions Weight Bearing Restrictions: No       Mobility Bed Mobility Overal bed mobility: Modified Independent             General bed mobility comments: supine<>sit with HOB slightly elevated Patient Response: Cooperative  Transfers Overall transfer level: Modified independent Equipment used: Rolling walker (2 wheels)               General transfer comment: STS from EOB without assistance, use of RW      Balance Overall balance assessment: Needs assistance Sitting-balance support: Feet supported Sitting balance-Leahy Scale: Normal     Standing balance support: During functional activity, Bilateral upper extremity supported Standing balance-Leahy Scale: Good                             ADL either performed or assessed with clinical judgement   ADL Overall ADL's : At baseline                                       General ADL Comments: Patient able to complete basic ADLs with standby assist.     Vision Baseline Vision/History: 1 Wears glasses Patient Visual Report: No change from baseline       Perception     Praxis      Pertinent Vitals/Pain Pain Assessment Pain Assessment: No/denies pain     Hand Dominance Right   Extremity/Trunk Assessment Upper Extremity Assessment Upper Extremity Assessment: Overall WFL for tasks assessed   Lower Extremity Assessment Lower Extremity Assessment: Generalized weakness       Communication Communication Communication: No difficulties   Cognition  Arousal/Alertness: Awake/alert Behavior During Therapy: WFL for tasks assessed/performed Overall Cognitive Status: Within Functional Limits for tasks assessed                                       General Comments  HR 76 and SpO2 at 100 with activity on room air    Exercises Other Exercises Other Exercises: provided education re: OT role and plan of care, fall and safety precautions, discharge recommendations   Shoulder Instructions      Home Living  Family/patient expects to be discharged to:: Private residence Living Arrangements: Alone Available Help at Discharge: Family;Available PRN/intermittently Type of Home: Mobile home Home Access: Stairs to enter Entrance Stairs-Number of Steps: 5 Entrance Stairs-Rails: Right;Left Home Layout: One level     Bathroom Shower/Tub: Tub/shower unit         Home Equipment: Agricultural consultant (2 wheels)          Prior Functioning/Environment Prior Level of Function : Independent/Modified Independent;Driving             Mobility Comments: Pt reports she was independent without AD, no falls ADLs Comments: Independent with ADLs and IADLs, including cooking, cleaning, meds, driving        OT Problem List:        OT Treatment/Interventions:      OT Goals(Current goals can be found in the care plan section) Acute Rehab OT Goals Patient Stated Goal: to return home OT Goal Formulation: With patient/family Potential to Achieve Goals: Good  OT Frequency:      Co-evaluation              AM-PAC OT "6 Clicks" Daily Activity     Outcome Measure Help from another person eating meals?: None Help from another person taking care of personal grooming?: None Help from another person toileting, which includes using toliet, bedpan, or urinal?: None Help from another person bathing (including washing, rinsing, drying)?: None Help from another person to put on and taking off regular upper body clothing?: None Help from another person to put on and taking off regular lower body clothing?: None 6 Click Score: 24   End of Session Equipment Utilized During Treatment: Gait belt;Rolling walker (2 wheels)  Activity Tolerance: Patient tolerated treatment well Patient left: in bed;with call bell/phone within reach;with family/visitor present  OT Visit Diagnosis: Unsteadiness on feet (R26.81)                Time: 8295-6213 OT Time Calculation (min): 18 min Charges:  OT General Charges $OT  Visit: 1 Visit OT Evaluation $OT Eval Low Complexity: 1 Low  [name], OTR/L 03/02/22, 3:23 PM

## 2022-03-02 NOTE — Evaluation (Signed)
Physical Therapy Evaluation Patient Details Name: [name] MRN: [id number] DOB: [birth year] Today's Date: 03/02/2022  History of Present Illness  Pt is a 71 y/o F admitted on 02/28/22 for tx of acute metabolic encephalopathy without clear etiology. PMH: HTN, diverticulitis, OSA, esophageal stricture  Clinical Impression  Pt seen for PT evaluation with pt agreeable, pt's daughter Lenna Sciara) present during session. Prior to admission pt was independent without AD, driving, & denies falls. Pt lives in a mobile home with 5 steps with wide set B rails to enter. On this date, pt is able to complete bed mobility & STS transfers independently. Pt ambulates around unit without AD & supervision with decreased BLE step length & stride length, slight lateral sway. Pt reports she's slightly more unsteady & slow with ambulation compared to her baseline. Pt negotiates 5 steps with R rail with supervision & step to pattern. Pt would benefit from ongoing skilled PT to address balance, strengthening, and gait without AD to facilitate return to PLOF.   Recommendations for follow up therapy are one component of a multi-disciplinary discharge planning process, led by the attending physician.  Recommendations may be updated based on patient status, additional functional criteria and insurance authorization.  Follow Up Recommendations Home health PT      Assistance Recommended at Discharge Intermittent Supervision/Assistance  Patient can return home with the following  A little help with walking and/or transfers;A little help with bathing/dressing/bathroom;Help with stairs or ramp for entrance    Equipment Recommendations None recommended by PT  Recommendations for Other Services       Functional Status Assessment Patient has had a recent decline in their functional status and demonstrates the ability to make significant improvements in function in a reasonable and predictable amount of time.      Precautions / Restrictions Precautions Precautions: Fall Restrictions Weight Bearing Restrictions: No      Mobility  Bed Mobility Overal bed mobility: Modified Independent             General bed mobility comments: supine<>sit with HOB slightly elevated    Transfers Overall transfer level: Modified independent Equipment used: None               General transfer comment: STS from EOB without assistance    Ambulation/Gait Ambulation/Gait assistance: Supervision Gait Distance (Feet): 200 Feet Assistive device: None Gait Pattern/deviations: Decreased step length - right, Decreased stride length, Decreased step length - left Gait velocity: decreased     General Gait Details: slight lateral sway  Stairs Stairs: Yes Stairs assistance: Supervision Stair Management: One rail Right Number of Stairs: 5 General stair comments: Pt negotiates 5 steps with R rail with step to pattern & supervision.  Wheelchair Mobility    Modified Rankin (Stroke Patients Only)       Balance Overall balance assessment: Needs assistance Sitting-balance support: Feet supported Sitting balance-Leahy Scale: Normal     Standing balance support: During functional activity, No upper extremity supported Standing balance-Leahy Scale: Good                               Pertinent Vitals/Pain Pain Assessment Pain Assessment: No/denies pain    Home Living Family/patient expects to be discharged to:: Private residence Living Arrangements: Alone Available Help at Discharge: Family;Available PRN/intermittently Type of Home: Mobile home Home Access: Stairs to enter Entrance Stairs-Rails: Right;Left (wideset) Entrance Stairs-Number of Steps: 5   Home Layout: One level Home Equipment: Rolling  Walker (2 wheels)      Prior Function Prior Level of Function : Independent/Modified Independent;Driving             Mobility Comments: Pt reports she was independent without  AD, driving, no falls.       Hand Dominance        Extremity/Trunk Assessment   Upper Extremity Assessment Upper Extremity Assessment: Overall WFL for tasks assessed    Lower Extremity Assessment Lower Extremity Assessment: Generalized weakness       Communication   Communication: No difficulties  Cognition Arousal/Alertness: Awake/alert Behavior During Therapy: WFL for tasks assessed/performed Overall Cognitive Status: Within Functional Limits for tasks assessed                                          General Comments      Exercises     Assessment/Plan    PT Assessment Patient needs continued PT services  PT Problem List Decreased strength;Decreased activity tolerance;Decreased balance;Decreased mobility;Decreased knowledge of use of DME       PT Treatment Interventions DME instruction;Therapeutic exercise;Gait training;Balance training;Stair training;Neuromuscular re-education;Functional mobility training;Therapeutic activities;Patient/family education    PT Goals (Current goals can be found in the Care Plan section)  Acute Rehab PT Goals Patient Stated Goal: go home PT Goal Formulation: With patient/family Time For Goal Achievement: 03/16/22 Potential to Achieve Goals: Good    Frequency Min 2X/week     Co-evaluation               AM-PAC PT "6 Clicks" Mobility  Outcome Measure Help needed turning from your back to your side while in a flat bed without using bedrails?: None Help needed moving from lying on your back to sitting on the side of a flat bed without using bedrails?: None Help needed moving to and from a bed to a chair (including a wheelchair)?: None Help needed standing up from a chair using your arms (e.g., wheelchair or bedside chair)?: None Help needed to walk in hospital room?: A Little Help needed climbing 3-5 steps with a railing? : A Little 6 Click Score: 22    End of Session   Activity Tolerance: Patient  tolerated treatment well Patient left: in bed;with call bell/phone within reach;with bed alarm set;with family/visitor present Nurse Communication: Mobility status PT Visit Diagnosis: Unsteadiness on feet (R26.81);Muscle weakness (generalized) (M62.81)    Time: 5643-3295 PT Time Calculation (min) (ACUTE ONLY): 10 min   Charges:   PT Evaluation $PT Eval Low Complexity: Bertram, PT, DPT 03/02/22, 2:23 PM   Waunita Schooner 03/02/2022, 2:21 PM

## 2022-03-02 NOTE — TOC CM/SW Note (Signed)
  Transition of Care [facility]) Screening Note   Patient Details  Name: [name] Date of Birth: 1950-06-15   Transition of Care [facility]) CM/SW Contact:    Colen Darling, [place] Phone Number: 03/02/2022, 4:47 PM    Transition of Care Department [facility]) has reviewed patient and no TOC needs have been identified at this time. We will continue to monitor patient advancement through interdisciplinary progression rounds. If new patient transition needs arise, please place a TOC consult.

## 2022-03-02 NOTE — Progress Notes (Signed)
PROGRESS NOTE    [name]  UVO:[id number] DOB: [birth year] DOA: 02/28/2022 PCP: [name], MD   Brief Narrative:  71 year old with history of HTN, diverticulitis, OSA, esophageal stricture admitted for acute metabolic encephalopathy without any unclear etiology.  CT of the head, UA, CTA chest is negative for any acute pathology.  Patient started on IV antibiotics and broad-spectrum antibiotics.   Assessment & Plan:  Principal Problem:   SIRS (systemic inflammatory response syndrome) (HCC) Active Problems:   Acute metabolic encephalopathy   Hypokalemia   Anxiety and depression   Dyslipidemia   GERD without esophagitis   Acute metabolic encephalopathy secondary to SIRS -Patient does not meet sepsis criteria as there is no source of infection.  Cultures remain negative, UA negative, CTA chest is also negative.  Lactic acid is normal.  On empiric IV vancomycin, cefepime and Flagyl.  Mentation is improved, procalcitonin negative.  Will discontinue antibiotics - CT head negative. - Ammonia normal.  Check TSH, B12, folate   Hypokalemia Replete as needed   Hyponatremia Improved with IV fluids   GERD Pepcid   Depression - On Celexa  Essential hypertension - On Toprol-XL, Aldactone  PT/OT  DVT prophylaxis: Lovenox Code Status: Full code Family Communication: Daughter at bedside  Status is: Inpatient Remains inpatient appropriate because: Mentation is improved, confirmed by daughter was present at bedside.  Still overall appears very weak and concerned about her home safety. Hopefully we can discharge her next 24 hours   Subjective: Seen and examined at bedside, patient denies any chest pain, shortness of breath and other complaints.   Examination:  General exam: Appears calm and comfortable  Respiratory system: Clear to auscultation. Respiratory effort normal. Cardiovascular system: S1 & S2 heard, RRR. No JVD, murmurs, rubs, gallops or clicks. No pedal  edema. Gastrointestinal system: Abdomen is nondistended, soft and nontender. No organomegaly or masses felt. Normal bowel sounds heard. Central nervous system: Alert and oriented. No focal neurological deficits. Extremities: Symmetric 5 x 5 power. Skin: Bruising noted left lower back area when she was combative earlier in the admission.  Also some ecchymosis of her right side of her face Psychiatry: Judgement and insight appear normal. Mood & affect appropriate.     Objective: Vitals:   03/01/22 1744 03/01/22 2047 03/02/22 0039 03/02/22 0430  BP: (!) 133/54 (!) 124/55 139/63 113/86  Pulse: 73 69 69 94  Resp: 16 17 17 18   Temp: 99 F (37.2 C) 98.1 F (36.7 C) 98.2 F (36.8 C) 98.3 F (36.8 C)  TempSrc:  Oral Oral Oral  SpO2: 98% 94% 97% 100%  Weight:      Height:        Intake/Output Summary (Last 24 hours) at 03/02/2022 0755 Last data filed at 03/02/2022 0602 Gross per 24 hour  Intake 3141.86 ml  Output --  Net 3141.86 ml   Filed Weights   03/01/22 0421  Weight: 75.6 kg     Data Reviewed:   CBC: Recent Labs  Lab 02/28/22 2057 03/01/22 0421 03/02/22 0402  WBC 15.4* 12.1* 7.1  NEUTROABS 12.0*  --   --   HGB 12.7 11.5* 11.2*  HCT 37.6 34.8* 33.4*  MCV 88.1 89.0 89.3  PLT 253 208 183   Basic Metabolic Panel: Recent Labs  Lab 02/28/22 2057 03/01/22 0421 03/02/22 0402  NA 130* 132* 136  K 3.3* 4.2 3.9  CL 97* 103 108  CO2 24 22 22   GLUCOSE 133* 101* 77  BUN 11 10 9  CREATININE 1.07* 0.83 0.86  CALCIUM 9.1 8.4* 8.5*   GFR: Estimated Creatinine Clearance: 56.6 mL/min (by C-G formula based on SCr of 0.86 mg/dL). Liver Function Tests: Recent Labs  Lab 02/28/22 2057 03/02/22 0402  AST 23 27  ALT 17 17  ALKPHOS 95 71  BILITOT 1.5* 0.7  PROT 7.3 5.8*  ALBUMIN 4.1 3.2*   No results for input(s): "LIPASE", "AMYLASE" in the last 168 hours. No results for input(s): "AMMONIA" in the last 168 hours. Coagulation Profile: Recent Labs  Lab  02/28/22 2057 03/01/22 0421  INR 1.1 1.2   Cardiac Enzymes: Recent Labs  Lab 02/28/22 2058  CKTOTAL 68   BNP (last 3 results) No results for input(s): "PROBNP" in the last 8760 hours. HbA1C: No results for input(s): "HGBA1C" in the last 72 hours. CBG: No results for input(s): "GLUCAP" in the last 168 hours. Lipid Profile: No results for input(s): "CHOL", "HDL", "LDLCALC", "TRIG", "CHOLHDL", "LDLDIRECT" in the last 72 hours. Thyroid Function Tests: No results for input(s): "TSH", "T4TOTAL", "FREET4", "T3FREE", "THYROIDAB" in the last 72 hours. Anemia Panel: No results for input(s): "VITAMINB12", "FOLATE", "FERRITIN", "TIBC", "IRON", "RETICCTPCT" in the last 72 hours. Sepsis Labs: Recent Labs  Lab 03/01/22 0013 03/01/22 0218 03/01/22 0421  LATICACIDVEN 1.1 1.1 0.8    Recent Results (from the past 240 hour(s))  Blood culture (routine x 2)     Status: None (Preliminary result)   Collection Time: 03/01/22 12:13 AM   Specimen: BLOOD  Result Value Ref Range Status   Specimen Description BLOOD BLOOD RIGHT FOREARM  Final   Special Requests   Final    BOTTLES DRAWN AEROBIC AND ANAEROBIC Blood Culture adequate volume   Culture   Final    NO GROWTH 1 DAY Performed at [facility], [address], [place], [place] [address]    Report Status PENDING  Incomplete  Blood culture (routine x 2)     Status: None (Preliminary result)   Collection Time: 03/01/22 12:13 AM   Specimen: BLOOD  Result Value Ref Range Status   Specimen Description BLOOD BLOOD LEFT WRIST  Final   Special Requests   Final    BOTTLES DRAWN AEROBIC AND ANAEROBIC Blood Culture results may not be optimal due to an inadequate volume of blood received in culture bottles   Culture   Final    NO GROWTH 1 DAY Performed at [facility], [address], [place], [place] [address]    Report Status PENDING  Incomplete         Radiology Studies: CT Angio Chest Pulmonary Embolism (PE) W or WO  Contrast  Result Date: 03/01/2022 CLINICAL DATA:  Pulmonary embolism suspected, high probability EXAM: CT ANGIOGRAPHY CHEST WITH CONTRAST TECHNIQUE: Multidetector CT imaging of the chest was performed using the standard protocol during bolus administration of intravenous contrast. Multiplanar CT image reconstructions and MIPs were obtained to evaluate the vascular anatomy. RADIATION DOSE REDUCTION: This exam was performed according to the departmental dose-optimization program which includes automated exposure control, adjustment of the mA and/or kV according to patient size and/or use of iterative reconstruction technique. CONTRAST:  75mL OMNIPAQUE IOHEXOL 350 MG/ML SOLN COMPARISON:  03/07/2019 FINDINGS: Cardiovascular: Satisfactory opacification of the pulmonary arteries to the segmental level. No evidence of pulmonary embolism. Normal heart size. No pericardial effusion. Coronary and aortic atheromatous calcification Mediastinum/Nodes: Tiny sliding hiatal hernia. Prior surgery at the esophageal hiatus. Negative for adenopathy Lungs/Pleura: There is no edema, consolidation, effusion, or pneumothorax. Upper Abdomen: Cholecystectomy. Two left  upper pole renal calculi measuring up to 4 mm. No acute finding Musculoskeletal: Thoracic spondylosis.  No acute finding Review of the MIP images confirms the above findings. IMPRESSION: 1. Negative for pulmonary embolism or other acute finding. 2. Atherosclerosis, including the coronary arteries. Electronically Signed   By: Tiburcio Pea M.D.   On: 03/01/2022 05:49   DG Chest Portable 1 View  Result Date: 02/28/2022 CLINICAL DATA:  Altered mental status. EXAM: PORTABLE CHEST 1 VIEW COMPARISON:  Chest radiograph dated 03/08/2019. FINDINGS: No focal consolidation, pleural effusion pneumothorax. The cardiac silhouette is within limits. Atherosclerotic calcification of the aorta. No acute osseous pathology. IMPRESSION: No active disease. Electronically Signed   By: Elgie Collard M.D.   On: 02/28/2022 22:57   CT HEAD WO CONTRAST ( )  Result Date: 02/28/2022 CLINICAL DATA:  Head trauma. EXAM: CT HEAD WITHOUT CONTRAST TECHNIQUE: Contiguous axial images were obtained from the base of the skull through the vertex without intravenous contrast. RADIATION DOSE REDUCTION: This exam was performed according to the departmental dose-optimization program which includes automated exposure control, adjustment of the mA and/or kV according to patient size and/or use of iterative reconstruction technique. COMPARISON:  Head CT dated 10/21/2021. FINDINGS: Brain: The ventricles and sulci are appropriate size for the patient's age. The gray-white matter discrimination is preserved. There is no acute intracranial hemorrhage. No mass effect or midline shift. No extra-axial fluid collection. Vascular: No hyperdense vessel or unexpected calcification. Skull: Normal. Negative for fracture or focal lesion. Sinuses/Orbits: No acute finding. Other: Small right frontal and parietal scalp contusion. IMPRESSION: 1. No acute intracranial pathology. 2. Small right frontal and parietal scalp contusion. Electronically Signed   By: Elgie Collard M.D.   On: 02/28/2022 21:28        Scheduled Meds:  aspirin EC  81 mg Oral Daily   atorvastatin  20 mg Oral Daily   citalopram  20 mg Oral Daily   diphenhydrAMINE  25 mg Intravenous Once   enoxaparin (LOVENOX) injection  40 mg Subcutaneous Q24H   famotidine  20 mg Oral QHS   hydrALAZINE  25 mg Oral BID   metoprolol succinate  25 mg Oral Daily   spironolactone  25 mg Oral Daily   Continuous Infusions:  sodium chloride     ceFEPime (MAXIPIME) IV Stopped (03/01/22 2233)   metronidazole Stopped (03/02/22 0136)   vancomycin Stopped (03/01/22 1039)     LOS: 2 days   Time spent= 35 mins    Cassidi Modesitt Joline Maxcy, MD [facility]  If 7PM-7AM, please contact night-coverage  03/02/2022, 7:55 AM

## 2022-03-03 DIAGNOSIS — R6510 Systemic inflammatory response syndrome (SIRS) of non-infectious origin without acute organ dysfunction: Secondary | ICD-10-CM | POA: Diagnosis not present

## 2022-03-03 LAB — COMPREHENSIVE METABOLIC PANEL
ALT: 19 U/L (ref 0–44)
AST: 27 U/L (ref 15–41)
Albumin: 3.5 g/dL (ref 3.5–5.0)
Alkaline Phosphatase: 76 U/L (ref 38–126)
Anion gap: 6 (ref 5–15)
BUN: 6 mg/dL — ABNORMAL LOW (ref 8–23)
CO2: 24 mmol/L (ref 22–32)
Calcium: 8.9 mg/dL (ref 8.9–10.3)
Chloride: 106 mmol/L (ref 98–111)
Creatinine, Ser: 0.74 mg/dL (ref 0.44–1.00)
GFR, Estimated: >60 mL/min (ref >60)
Glucose, Bld: 88 mg/dL (ref 70–99)
Potassium: 3.7 mmol/L (ref 3.5–5.1)
Sodium: 136 mmol/L (ref 135–145)
Total Bilirubin: 0.8 mg/dL (ref 0.3–1.2)
Total Protein: 6.2 g/dL — ABNORMAL LOW (ref 6.5–8.1)

## 2022-03-03 LAB — MAGNESIUM: Magnesium: 1.7 mg/dL (ref 1.7–2.4)

## 2022-03-03 LAB — FOLATE: Folate: 12.3 ng/mL (ref >5.9)

## 2022-03-03 MED ORDER — POTASSIUM CHLORIDE 20 MEQ PO PACK
40.0000 meq | PACK | Freq: Once | ORAL | Status: AC
  Administered 2022-03-03: 40 meq via ORAL
  Filled 2022-03-03: qty 2

## 2022-03-03 NOTE — Progress Notes (Signed)
Patient verbalized understanding of all d/c instructions including need for follow up appointment. Patient still awaiting pick up from daughter for transport home.

## 2022-03-03 NOTE — Progress Notes (Signed)
Patient OOF in stable condition via w/c transport from [facility]. Private vehicle transportation home.

## 2022-03-03 NOTE — TOC Transition Note (Signed)
Transition of Care [facility]) - CM/SW Discharge Note   Patient Details  Name: VADIS SLABACH MRN: [id number] Date of Birth: November 07, 1950  Transition of Care [facility]) CM/SW Contact:  Colen Darling, [place] Phone Number: 03/03/2022, 5:01 PM   Clinical Narrative:     Patient accepted to [facility] for HHPT. OT is not available in the patient's town.  Final next level of care: [place]     Patient Goals and CMS Choice   CMS Medicare.gov Compare Post Acute Care list provided to:: Patient    Discharge Placement                    Patient and family notified of of transfer: 03/03/22  Discharge Plan and Services                          HH Arranged: PT [facility] Agency: [facility]     Representative spoke with at [place]: Meg  Social Determinants of Health (SDOH) Interventions     Readmission Risk Interventions     No data to display

## 2022-03-03 NOTE — Care Management Important Message (Signed)
Important Message  Patient Details  Name: [name] MRN: [id number] Date of Birth: 1950/05/28   Medicare Important Message Given:  N/A - LOS <3 / Initial given by admissions     Dannette Barbara 03/03/2022, 8:38 AM

## 2022-03-03 NOTE — Discharge Summary (Signed)
Physician Discharge Summary  [name] KZL:[id number] DOB: 02/03/1951 DOA: 02/28/2022  PCP: Rusty Aus, MD  Admit date: 02/28/2022 Discharge date: 03/03/2022  Admitted From: Home Disposition: Home  Recommendations for Outpatient Follow-up:  Follow up with PCP in 1-2 weeks Please obtain BMP/CBC in one week your next doctors visit.    Home Health: PT/OT  Discharge Condition: Stable CODE STATUS: Full code Diet recommendation: Regular  Brief/Interim Summary: 71 year old with history of HTN, diverticulitis, OSA, esophageal stricture admitted for acute metabolic encephalopathy without any unclear etiology.  CT of the head, UA, CTA chest is negative for any acute pathology.  Patient started on IV antibiotics and broad-spectrum antibiotics.  Eventually no source of infection was identified, procalcitonin was negative.  PT recommended home health.  Mentally doing much better and back to baseline in terms of strength.  We will be discharging her today in stable condition.  Off antibiotics.     Assessment & Plan:  Principal Problem:   SIRS (systemic inflammatory response syndrome) (HCC) Active Problems:   Acute metabolic encephalopathy   Hypokalemia   Anxiety and depression   Dyslipidemia   GERD without esophagitis   Acute metabolic encephalopathy secondary to SIRS, resolved -Patient does not meet sepsis criteria as there is no source of infection.  Cultures remain negative, UA negative, CTA chest is also negative.  Lactic acid is normal.  On empiric IV vancomycin, cefepime and Flagyl.  Mentation is improved, procalcitonin negative.  Will discontinue antibiotics - CT head negative. - Ammonia normal.  TSH, B12, folate normal   Hypokalemia Replete as needed   Hyponatremia Improved with IV fluids   GERD Pepcid   Depression - On Celexa   Essential hypertension - On Toprol-XL, Aldactone      Discharge Diagnoses:  Principal Problem:   SIRS (systemic inflammatory  response syndrome) (HCC) Active Problems:   Acute metabolic encephalopathy   Hypokalemia   Anxiety and depression   Dyslipidemia   GERD without esophagitis      Consultations: None  Subjective: Ending up in the room walking around without any issues.  Mentation back to baseline.  Discharge Exam: Vitals:   03/03/22 0558 03/03/22 0736  BP: (!) 126/50 (!) 144/97  Pulse: (!) 57 83  Resp: 18 18  Temp: 98.4 F (36.9 C) 98.2 F (36.8 C)  SpO2: 99% 98%   Vitals:   03/02/22 1609 03/02/22 2016 03/03/22 0558 03/03/22 0736  BP: (!) 128/56 (!) 143/37 (!) 126/50 (!) 144/97  Pulse: 64 66 (!) 57 83  Resp: '17 16 18 18  '$ Temp: 98.4 F (36.9 C) 98.8 F (37.1 C) 98.4 F (36.9 C) 98.2 F (36.8 C)  TempSrc:    Oral  SpO2: 98% 97% 99% 98%  Weight:      Height:        General: Pt is alert, awake, not in acute distress Cardiovascular: RRR, S1/S2 +, no rubs, no gallops Respiratory: CTA bilaterally, no wheezing, no rhonchi Abdominal: Soft, NT, ND, bowel sounds + Extremities: no edema, no cyanosis Some ecchymosis of the right face but improved compared to yesterday.  This is secondary to recent fall  Discharge Instructions   Allergies as of 03/03/2022       Reactions   Dicyclomine Other (See Comments)   REACTION: Choking   Lisinopril Other (See Comments)   REACTION: Choking   Losartan Shortness Of Breath   Ace Inhibitors Other (See Comments)   Unknown to patient   Penicillins Itching, Rash  Medication List     TAKE these medications    albuterol 108 (90 Base) MCG/ACT inhaler Commonly known as: VENTOLIN HFA Inhale 2 puffs into the lungs every 6 (six) hours as needed for wheezing or shortness of breath.   aspirin 81 MG tablet Take 1 tablet (81 mg total) by mouth daily.   atorvastatin 20 MG tablet Commonly known as: LIPITOR Take 20 mg by mouth daily.   citalopram 20 MG tablet Commonly known as: CELEXA Take 20 mg by mouth daily.   famotidine 20 MG  tablet Commonly known as: PEPCID Take 20 mg by mouth at bedtime.   hydrALAZINE 25 MG tablet Commonly known as: APRESOLINE Take 25 mg by mouth in the morning and at bedtime.   isosorbide mononitrate 30 MG 24 hr tablet Commonly known as: IMDUR Take 30 mg by mouth daily.   loperamide 2 MG capsule Commonly known as: IMODIUM Take 2 capsules (4 mg total) by mouth every 8 (eight) hours as needed for diarrhea or loose stools.   LORazepam 0.5 MG tablet Commonly known as: ATIVAN Take 0.5 mg by mouth every 8 (eight) hours as needed. For anxiety   metoprolol succinate 25 MG 24 hr tablet Commonly known as: TOPROL-XL TAKE 1 TABLET BY MOUTH TWICE DAILY FOR 90 DAYS   spironolactone 25 MG tablet Commonly known as: ALDACTONE Take 1 tablet (25 mg total) by mouth daily.        Follow-up Information     Rusty Aus, MD Follow up in 1 week(s).   Specialty: Internal Medicine Why: Daughter will make follow up appt around her schedule Contact information: [place] [address] [phone]                Allergies  Allergen Reactions   Dicyclomine Other (See Comments)    REACTION: Choking   Lisinopril Other (See Comments)    REACTION: Choking   Losartan Shortness Of Breath   Ace Inhibitors Other (See Comments)    Unknown to patient   Penicillins Itching and Rash    You were cared for by a hospitalist during your hospital stay. If you have any questions about your discharge medications or the care you received while you were in the hospital after you are discharged, you can call the unit and asked to speak with the hospitalist on call if the hospitalist that took care of you is not available. Once you are discharged, your primary care physician will handle any further medical issues. Please note that no refills for any discharge medications will be authorized once you are discharged, as it is imperative that you return to  your primary care physician (or establish a relationship with a primary care physician if you do not have one) for your aftercare needs so that they can reassess your need for medications and monitor your lab values.   Procedures/Studies: CT Angio Chest Pulmonary Embolism (PE) W or WO Contrast  Result Date: 03/01/2022 CLINICAL DATA:  Pulmonary embolism suspected, high probability EXAM: CT ANGIOGRAPHY CHEST WITH CONTRAST TECHNIQUE: Multidetector CT imaging of the chest was performed using the standard protocol during bolus administration of intravenous contrast. Multiplanar CT image reconstructions and MIPs were obtained to evaluate the vascular anatomy. RADIATION DOSE REDUCTION: This exam was performed according to the departmental dose-optimization program which includes automated exposure control, adjustment of the mA and/or kV according to patient size and/or use of iterative reconstruction technique. CONTRAST:  74m OMNIPAQUE IOHEXOL 350 MG/ML  SOLN COMPARISON:  03/07/2019 FINDINGS: Cardiovascular: Satisfactory opacification of the pulmonary arteries to the segmental level. No evidence of pulmonary embolism. Normal heart size. No pericardial effusion. Coronary and aortic atheromatous calcification Mediastinum/Nodes: Tiny sliding hiatal hernia. Prior surgery at the esophageal hiatus. Negative for adenopathy Lungs/Pleura: There is no edema, consolidation, effusion, or pneumothorax. Upper Abdomen: Cholecystectomy. Two left upper pole renal calculi measuring up to 4 mm. No acute finding Musculoskeletal: Thoracic spondylosis.  No acute finding Review of the MIP images confirms the above findings. IMPRESSION: 1. Negative for pulmonary embolism or other acute finding. 2. Atherosclerosis, including the coronary arteries. Electronically Signed   By: Jorje Guild M.D.   On: 03/01/2022 05:49   DG Chest Portable 1 View  Result Date: 02/28/2022 CLINICAL DATA:  Altered mental status. EXAM: PORTABLE CHEST 1 VIEW  COMPARISON:  Chest radiograph dated 03/08/2019. FINDINGS: No focal consolidation, pleural effusion pneumothorax. The cardiac silhouette is within limits. Atherosclerotic calcification of the aorta. No acute osseous pathology. IMPRESSION: No active disease. Electronically Signed   By: Anner Crete M.D.   On: 02/28/2022 22:57   CT HEAD WO CONTRAST (5MM)  Result Date: 02/28/2022 CLINICAL DATA:  Head trauma. EXAM: CT HEAD WITHOUT CONTRAST TECHNIQUE: Contiguous axial images were obtained from the base of the skull through the vertex without intravenous contrast. RADIATION DOSE REDUCTION: This exam was performed according to the departmental dose-optimization program which includes automated exposure control, adjustment of the mA and/or kV according to patient size and/or use of iterative reconstruction technique. COMPARISON:  Head CT dated 10/21/2021. FINDINGS: Brain: The ventricles and sulci are appropriate size for the patient's age. The gray-white matter discrimination is preserved. There is no acute intracranial hemorrhage. No mass effect or midline shift. No extra-axial fluid collection. Vascular: No hyperdense vessel or unexpected calcification. Skull: Normal. Negative for fracture or focal lesion. Sinuses/Orbits: No acute finding. Other: Small right frontal and parietal scalp contusion. IMPRESSION: 1. No acute intracranial pathology. 2. Small right frontal and parietal scalp contusion. Electronically Signed   By: Anner Crete M.D.   On: 02/28/2022 21:28     The results of significant diagnostics from this hospitalization (including imaging, microbiology, ancillary and laboratory) are listed below for reference.     Microbiology: Recent Results (from the past 240 hour(s))  Blood culture (routine x 2)     Status: None (Preliminary result)   Collection Time: 03/01/22 12:13 AM   Specimen: BLOOD  Result Value Ref Range Status   Specimen Description BLOOD BLOOD RIGHT FOREARM  Final   Special  Requests   Final    BOTTLES DRAWN AEROBIC AND ANAEROBIC Blood Culture adequate volume   Culture   Final    NO GROWTH 1 DAY Performed at [facility], [address] [place], [place] [address]    Report Status PENDING  Incomplete  Blood culture (routine x 2)     Status: None (Preliminary result)   Collection Time: 03/01/22 12:13 AM   Specimen: BLOOD  Result Value Ref Range Status   Specimen Description BLOOD BLOOD LEFT WRIST  Final   Special Requests   Final    BOTTLES DRAWN AEROBIC AND ANAEROBIC Blood Culture results may not be optimal due to an inadequate volume of blood received in culture bottles   Culture   Final    NO GROWTH 1 DAY Performed at [facility], [address] [place], [place] [address]    Report Status PENDING  Incomplete     Labs: BNP (last 3  results) No results for input(s): "BNP" in the last 8760 hours. Basic Metabolic Panel: Recent Labs  Lab 02/28/22 2057 03/01/22 0421 03/02/22 0402 03/03/22 0425  NA 130* 132* 136 136  K 3.3* 4.2 3.9 3.7  CL 97* 103 108 106  CO2 '24 22 22 24  '$ GLUCOSE 133* 101* 77 88  BUN '11 10 9 '$ 6*  CREATININE 1.07* 0.83 0.86 0.74  CALCIUM 9.1 8.4* 8.5* 8.9  MG  --   --   --  1.7   Liver Function Tests: Recent Labs  Lab 02/28/22 2057 03/02/22 0402 03/03/22 0425  AST '23 27 27  '$ ALT '17 17 19  '$ ALKPHOS 95 71 76  BILITOT 1.5* 0.7 0.8  PROT 7.3 5.8* 6.2*  ALBUMIN 4.1 3.2* 3.5   No results for input(s): "LIPASE", "AMYLASE" in the last 168 hours. Recent Labs  Lab 03/02/22 0909  AMMONIA 16   CBC: Recent Labs  Lab 02/28/22 2057 03/01/22 0421 03/02/22 0402  WBC 15.4* 12.1* 7.1  NEUTROABS 12.0*  --   --   HGB 12.7 11.5* 11.2*  HCT 37.6 34.8* 33.4*  MCV 88.1 89.0 89.3  PLT 253 208 183   Cardiac Enzymes: Recent Labs  Lab 02/28/22 2058  CKTOTAL 68   BNP: Invalid input(s): "POCBNP" CBG: No results for input(s): "GLUCAP" in the last 168 hours. D-Dimer Recent Labs    03/01/22 0421  DDIMER  8.86*   Hgb A1c No results for input(s): "HGBA1C" in the last 72 hours. Lipid Profile No results for input(s): "CHOL", "HDL", "LDLCALC", "TRIG", "CHOLHDL", "LDLDIRECT" in the last 72 hours. Thyroid function studies Recent Labs    03/02/22 0909  TSH 3.099   Anemia work up Recent Labs    03/02/22 0909 03/03/22 0425  VITAMINB12 534  --   FOLATE  --  12.3   Urinalysis    Component Value Date/Time   COLORURINE AMBER (A) 02/28/2022 2129   APPEARANCEUR HAZY (A) 02/28/2022 2129   APPEARANCEUR Clear 11/22/2013 0741   LABSPEC 1.032 (H) 02/28/2022 2129   LABSPEC 1.010 11/22/2013 0741   PHURINE 5.0 02/28/2022 2129   GLUCOSEU NEGATIVE 02/28/2022 2129   GLUCOSEU Negative 11/22/2013 0741   GLUCOSEU NEGATIVE 08/12/2010 1641   HGBUR NEGATIVE 02/28/2022 2129   BILIRUBINUR SMALL (A) 02/28/2022 2129   BILIRUBINUR Negative 11/22/2013 0741   KETONESUR 20 (A) 02/28/2022 2129   PROTEINUR 100 (A) 02/28/2022 2129   UROBILINOGEN 1.0 08/13/2010 1052   NITRITE NEGATIVE 02/28/2022 2129   LEUKOCYTESUR NEGATIVE 02/28/2022 2129   LEUKOCYTESUR Trace 11/22/2013 0741   Sepsis Labs Recent Labs  Lab 02/28/22 2057 03/01/22 0421 03/02/22 0402  WBC 15.4* 12.1* 7.1   Microbiology Recent Results (from the past 240 hour(s))  Blood culture (routine x 2)     Status: None (Preliminary result)   Collection Time: 03/01/22 12:13 AM   Specimen: BLOOD  Result Value Ref Range Status   Specimen Description BLOOD BLOOD RIGHT FOREARM  Final   Special Requests   Final    BOTTLES DRAWN AEROBIC AND ANAEROBIC Blood Culture adequate volume   Culture   Final    NO GROWTH 1 DAY Performed at [facility], [address] [place], [place] [address]    Report Status PENDING  Incomplete  Blood culture (routine x 2)     Status: None (Preliminary result)   Collection Time: 03/01/22 12:13 AM   Specimen: BLOOD  Result Value Ref Range Status   Specimen Description BLOOD BLOOD LEFT WRIST  Final  Special  Requests   Final    BOTTLES DRAWN AEROBIC AND ANAEROBIC Blood Culture results may not be optimal due to an inadequate volume of blood received in culture bottles   Culture   Final    NO GROWTH 1 DAY Performed at [facility], [address], [place], [place] [address]    Report Status PENDING  Incomplete     Time coordinating discharge:  I have spent 35 minutes face to face with the patient and on the ward discussing the patients care, assessment, plan and disposition with other care givers. >50% of the time was devoted counseling the patient about the risks and benefits of treatment/Discharge disposition and coordinating care.   SIGNED:   Damita Lack, MD  [facility] 03/03/2022, 10:54 AM   If 7PM-7AM, please contact night-coverage

## 2022-03-03 NOTE — Progress Notes (Signed)
Patient to be discharged home today with daughter. Daughter is working half a day today and will be here to transport her home after lunch time.

## 2022-03-03 NOTE — TOC Progression Note (Addendum)
Transition of Care [facility]) - Progression Note    Patient Details  Name: [name] MRN: [id number] Date of Birth: December 07, 1950  [facility] CM/SW [place], [place] Phone Number: 03/03/2022, 10:36 AM  Clinical Narrative:     SW met with the patient and she is interested in HHPT/HHOT with either [facility] or [facility]. The patient sees PCP Dr. Emily Filbert. Her pharmacy is [facility] [address] in [place]. She has a walker at home. Her daughter can drive her home.  SW reached out to [facility] and [facility].  17:00: Patient accepted to [facility]. [facility] only has HHPT available in the patient's town.       Expected Discharge Plan and Services           Expected Discharge Date: 03/03/22                                     Social Determinants of Health (SDOH) Interventions    Readmission Risk Interventions     No data to display

## 2022-03-06 LAB — CULTURE, BLOOD (ROUTINE X 2)
Culture: NO GROWTH
Culture: NO GROWTH
Special Requests: ADEQUATE

## 2022-03-10 DIAGNOSIS — E782 Mixed hyperlipidemia: Secondary | ICD-10-CM | POA: Diagnosis not present

## 2022-03-10 DIAGNOSIS — G9340 Encephalopathy, unspecified: Secondary | ICD-10-CM | POA: Diagnosis not present

## 2022-03-10 DIAGNOSIS — M5116 Intervertebral disc disorders with radiculopathy, lumbar region: Secondary | ICD-10-CM | POA: Diagnosis not present

## 2022-03-10 DIAGNOSIS — G119 Hereditary ataxia, unspecified: Secondary | ICD-10-CM | POA: Diagnosis not present

## 2022-03-10 DIAGNOSIS — E538 Deficiency of other specified B group vitamins: Secondary | ICD-10-CM | POA: Diagnosis not present

## 2022-03-10 DIAGNOSIS — Z79899 Other long term (current) drug therapy: Secondary | ICD-10-CM | POA: Diagnosis not present

## 2022-03-10 DIAGNOSIS — E871 Hypo-osmolality and hyponatremia: Secondary | ICD-10-CM | POA: Diagnosis not present

## 2022-03-10 DIAGNOSIS — R739 Hyperglycemia, unspecified: Secondary | ICD-10-CM | POA: Diagnosis not present

## 2022-03-11 ENCOUNTER — Other Ambulatory Visit: Payer: Self-pay | Admitting: Internal Medicine

## 2022-03-11 DIAGNOSIS — G119 Hereditary ataxia, unspecified: Secondary | ICD-10-CM

## 2022-03-23 DIAGNOSIS — I10 Essential (primary) hypertension: Secondary | ICD-10-CM | POA: Diagnosis not present

## 2022-03-23 DIAGNOSIS — M5430 Sciatica, unspecified side: Secondary | ICD-10-CM | POA: Diagnosis not present

## 2022-03-23 DIAGNOSIS — Z0000 Encounter for general adult medical examination without abnormal findings: Secondary | ICD-10-CM | POA: Diagnosis not present

## 2022-03-23 DIAGNOSIS — E782 Mixed hyperlipidemia: Secondary | ICD-10-CM | POA: Diagnosis not present

## 2022-03-23 DIAGNOSIS — M5116 Intervertebral disc disorders with radiculopathy, lumbar region: Secondary | ICD-10-CM | POA: Diagnosis not present

## 2022-03-31 ENCOUNTER — Ambulatory Visit
Admission: RE | Admit: 2022-03-31 | Discharge: 2022-03-31 | Disposition: A | Discharge status: Home / Self Care | Payer: PPO | Source: Ambulatory Visit | Attending: Internal Medicine | Admitting: Internal Medicine

## 2022-03-31 DIAGNOSIS — G119 Hereditary ataxia, unspecified: Secondary | ICD-10-CM

## 2022-03-31 MED ORDER — GADOBENATE DIMEGLUMINE 529 MG/ML IV SOLN
15.0000 mL | Freq: Once | INTRAVENOUS | Status: AC | PRN
  Administered 2022-03-31: 15 mL via INTRAVENOUS

## 2022-04-27 DIAGNOSIS — E782 Mixed hyperlipidemia: Secondary | ICD-10-CM | POA: Diagnosis not present

## 2022-04-27 DIAGNOSIS — I10 Essential (primary) hypertension: Secondary | ICD-10-CM | POA: Diagnosis not present

## 2022-06-26 DIAGNOSIS — J040 Acute laryngitis: Secondary | ICD-10-CM | POA: Diagnosis not present

## 2022-06-26 DIAGNOSIS — J029 Acute pharyngitis, unspecified: Secondary | ICD-10-CM | POA: Diagnosis not present

## 2022-06-26 DIAGNOSIS — J329 Chronic sinusitis, unspecified: Secondary | ICD-10-CM | POA: Diagnosis not present

## 2022-06-26 DIAGNOSIS — J40 Bronchitis, not specified as acute or chronic: Secondary | ICD-10-CM | POA: Diagnosis not present

## 2022-06-26 DIAGNOSIS — Z03818 Encounter for observation for suspected exposure to other biological agents ruled out: Secondary | ICD-10-CM | POA: Diagnosis not present

## 2022-06-26 DIAGNOSIS — B9689 Other specified bacterial agents as the cause of diseases classified elsewhere: Secondary | ICD-10-CM | POA: Diagnosis not present

## 2022-08-24 DIAGNOSIS — J029 Acute pharyngitis, unspecified: Secondary | ICD-10-CM | POA: Diagnosis not present

## 2022-09-11 ENCOUNTER — Other Ambulatory Visit: Payer: Self-pay | Admitting: Internal Medicine

## 2022-09-11 DIAGNOSIS — Z1231 Encounter for screening mammogram for malignant neoplasm of breast: Secondary | ICD-10-CM

## 2022-09-18 DIAGNOSIS — E782 Mixed hyperlipidemia: Secondary | ICD-10-CM | POA: Diagnosis not present

## 2022-09-21 DIAGNOSIS — E782 Mixed hyperlipidemia: Secondary | ICD-10-CM | POA: Diagnosis not present

## 2022-09-21 DIAGNOSIS — R0609 Other forms of dyspnea: Secondary | ICD-10-CM | POA: Diagnosis not present

## 2022-09-21 DIAGNOSIS — Z0000 Encounter for general adult medical examination without abnormal findings: Secondary | ICD-10-CM | POA: Diagnosis not present

## 2022-09-21 DIAGNOSIS — I2089 Other forms of angina pectoris: Secondary | ICD-10-CM | POA: Diagnosis not present

## 2022-09-21 DIAGNOSIS — E538 Deficiency of other specified B group vitamins: Secondary | ICD-10-CM | POA: Diagnosis not present

## 2022-09-21 DIAGNOSIS — F330 Major depressive disorder, recurrent, mild: Secondary | ICD-10-CM | POA: Diagnosis not present

## 2022-09-21 DIAGNOSIS — D500 Iron deficiency anemia secondary to blood loss (chronic): Secondary | ICD-10-CM | POA: Diagnosis not present

## 2022-09-22 ENCOUNTER — Other Ambulatory Visit: Payer: Self-pay

## 2022-09-22 DIAGNOSIS — R0609 Other forms of dyspnea: Secondary | ICD-10-CM

## 2022-09-22 DIAGNOSIS — I2089 Other forms of angina pectoris: Secondary | ICD-10-CM

## 2022-09-22 DIAGNOSIS — D500 Iron deficiency anemia secondary to blood loss (chronic): Secondary | ICD-10-CM

## 2022-09-30 ENCOUNTER — Ambulatory Visit
Admission: RE | Admit: 2022-09-30 | Discharge: 2022-09-30 | Disposition: A | Discharge status: Home / Self Care | Payer: PPO | Source: Ambulatory Visit | Attending: Internal Medicine | Admitting: Internal Medicine

## 2022-09-30 DIAGNOSIS — I2089 Other forms of angina pectoris: Secondary | ICD-10-CM | POA: Diagnosis not present

## 2022-09-30 DIAGNOSIS — D500 Iron deficiency anemia secondary to blood loss (chronic): Secondary | ICD-10-CM | POA: Diagnosis not present

## 2022-09-30 DIAGNOSIS — R42 Dizziness and giddiness: Secondary | ICD-10-CM | POA: Diagnosis not present

## 2022-09-30 DIAGNOSIS — R0602 Shortness of breath: Secondary | ICD-10-CM | POA: Diagnosis not present

## 2022-09-30 DIAGNOSIS — R0609 Other forms of dyspnea: Secondary | ICD-10-CM | POA: Diagnosis not present

## 2022-09-30 DIAGNOSIS — R079 Chest pain, unspecified: Secondary | ICD-10-CM | POA: Diagnosis not present

## 2022-09-30 MED ORDER — IOHEXOL 350 MG/ML SOLN
75.0000 mL | Freq: Once | INTRAVENOUS | Status: AC | PRN
  Administered 2022-09-30: 75 mL via INTRAVENOUS

## 2022-10-01 DIAGNOSIS — D500 Iron deficiency anemia secondary to blood loss (chronic): Secondary | ICD-10-CM | POA: Diagnosis not present

## 2022-10-02 ENCOUNTER — Ambulatory Visit
Admission: RE | Admit: 2022-10-02 | Discharge: 2022-10-02 | Disposition: A | Discharge status: Home / Self Care | Payer: PPO | Source: Ambulatory Visit | Attending: Internal Medicine | Admitting: Internal Medicine

## 2022-10-02 DIAGNOSIS — Z1231 Encounter for screening mammogram for malignant neoplasm of breast: Secondary | ICD-10-CM | POA: Insufficient documentation

## 2022-10-09 ENCOUNTER — Other Ambulatory Visit: Payer: Self-pay | Admitting: Internal Medicine

## 2022-10-09 DIAGNOSIS — N6489 Other specified disorders of breast: Secondary | ICD-10-CM

## 2022-10-09 DIAGNOSIS — R928 Other abnormal and inconclusive findings on diagnostic imaging of breast: Secondary | ICD-10-CM

## 2022-10-13 ENCOUNTER — Ambulatory Visit
Admission: RE | Admit: 2022-10-13 | Discharge: 2022-10-13 | Disposition: A | Discharge status: Home / Self Care | Payer: PPO | Source: Ambulatory Visit | Attending: Internal Medicine | Admitting: Internal Medicine

## 2022-10-13 DIAGNOSIS — N6489 Other specified disorders of breast: Secondary | ICD-10-CM

## 2022-10-13 DIAGNOSIS — R92321 Mammographic fibroglandular density, right breast: Secondary | ICD-10-CM | POA: Diagnosis not present

## 2022-10-13 DIAGNOSIS — R928 Other abnormal and inconclusive findings on diagnostic imaging of breast: Secondary | ICD-10-CM | POA: Diagnosis not present

## 2022-10-19 DIAGNOSIS — R1013 Epigastric pain: Secondary | ICD-10-CM | POA: Diagnosis not present

## 2022-10-19 DIAGNOSIS — Z8719 Personal history of other diseases of the digestive system: Secondary | ICD-10-CM | POA: Diagnosis not present

## 2022-10-19 DIAGNOSIS — R140 Abdominal distension (gaseous): Secondary | ICD-10-CM | POA: Diagnosis not present

## 2022-10-19 DIAGNOSIS — R6881 Early satiety: Secondary | ICD-10-CM | POA: Diagnosis not present

## 2022-10-19 DIAGNOSIS — R195 Other fecal abnormalities: Secondary | ICD-10-CM | POA: Diagnosis not present

## 2022-10-19 DIAGNOSIS — D509 Iron deficiency anemia, unspecified: Secondary | ICD-10-CM | POA: Diagnosis not present

## 2022-10-19 DIAGNOSIS — Z9889 Other specified postprocedural states: Secondary | ICD-10-CM | POA: Diagnosis not present

## 2022-10-19 DIAGNOSIS — K2100 Gastro-esophageal reflux disease with esophagitis, without bleeding: Secondary | ICD-10-CM | POA: Diagnosis not present

## 2022-10-19 DIAGNOSIS — R1319 Other dysphagia: Secondary | ICD-10-CM | POA: Diagnosis not present

## 2022-10-19 DIAGNOSIS — Z9049 Acquired absence of other specified parts of digestive tract: Secondary | ICD-10-CM | POA: Diagnosis not present

## 2022-10-19 DIAGNOSIS — K209 Esophagitis, unspecified without bleeding: Secondary | ICD-10-CM | POA: Diagnosis not present

## 2022-10-20 DIAGNOSIS — R0609 Other forms of dyspnea: Secondary | ICD-10-CM | POA: Diagnosis not present

## 2022-10-20 DIAGNOSIS — I2089 Other forms of angina pectoris: Secondary | ICD-10-CM | POA: Diagnosis not present

## 2022-10-27 DIAGNOSIS — I517 Cardiomegaly: Secondary | ICD-10-CM | POA: Diagnosis not present

## 2022-10-27 DIAGNOSIS — R079 Chest pain, unspecified: Secondary | ICD-10-CM | POA: Diagnosis not present

## 2022-10-27 DIAGNOSIS — R0609 Other forms of dyspnea: Secondary | ICD-10-CM | POA: Diagnosis not present

## 2022-11-17 DIAGNOSIS — D500 Iron deficiency anemia secondary to blood loss (chronic): Secondary | ICD-10-CM | POA: Diagnosis not present

## 2022-11-17 DIAGNOSIS — E538 Deficiency of other specified B group vitamins: Secondary | ICD-10-CM | POA: Diagnosis not present

## 2022-11-20 ENCOUNTER — Other Ambulatory Visit: Payer: Self-pay | Admitting: Physician Assistant

## 2022-11-20 DIAGNOSIS — R079 Chest pain, unspecified: Secondary | ICD-10-CM

## 2022-11-24 DIAGNOSIS — I517 Cardiomegaly: Secondary | ICD-10-CM | POA: Diagnosis not present

## 2022-11-24 DIAGNOSIS — R0609 Other forms of dyspnea: Secondary | ICD-10-CM | POA: Diagnosis not present

## 2022-11-24 DIAGNOSIS — T39395A Adverse effect of other nonsteroidal anti-inflammatory drugs [NSAID], initial encounter: Secondary | ICD-10-CM | POA: Diagnosis not present

## 2022-11-24 DIAGNOSIS — D500 Iron deficiency anemia secondary to blood loss (chronic): Secondary | ICD-10-CM | POA: Insufficient documentation

## 2022-11-24 DIAGNOSIS — K2960 Other gastritis without bleeding: Secondary | ICD-10-CM | POA: Diagnosis not present

## 2022-11-30 ENCOUNTER — Emergency Department
Admission: EM | Admit: 2022-11-30 | Discharge: 2022-11-30 | Disposition: A | Discharge status: Home / Self Care | Payer: PPO | Attending: Emergency Medicine | Admitting: Emergency Medicine

## 2022-11-30 DIAGNOSIS — R55 Syncope and collapse: Secondary | ICD-10-CM | POA: Insufficient documentation

## 2022-11-30 DIAGNOSIS — R531 Weakness: Secondary | ICD-10-CM | POA: Diagnosis not present

## 2022-11-30 DIAGNOSIS — R0789 Other chest pain: Secondary | ICD-10-CM | POA: Diagnosis present

## 2022-11-30 DIAGNOSIS — I4891 Unspecified atrial fibrillation: Secondary | ICD-10-CM | POA: Insufficient documentation

## 2022-11-30 DIAGNOSIS — I10 Essential (primary) hypertension: Secondary | ICD-10-CM | POA: Diagnosis not present

## 2022-11-30 DIAGNOSIS — R000 Tachycardia, unspecified: Secondary | ICD-10-CM | POA: Diagnosis not present

## 2022-11-30 LAB — BASIC METABOLIC PANEL
Anion gap: 6 (ref 5–15)
BUN: 13 mg/dL (ref 8–23)
CO2: 22 mmol/L (ref 22–32)
Calcium: 7.9 mg/dL — ABNORMAL LOW (ref 8.9–10.3)
Chloride: 108 mmol/L (ref 98–111)
Creatinine, Ser: 0.68 mg/dL (ref 0.44–1.00)
GFR, Estimated: >60 mL/min (ref >60)
Glucose, Bld: 88 mg/dL (ref 70–99)
Potassium: 3.5 mmol/L (ref 3.5–5.1)
Sodium: 136 mmol/L (ref 135–145)

## 2022-11-30 LAB — CBC WITH DIFFERENTIAL/PLATELET
Abs Immature Granulocytes: 0.04 10*3/uL (ref 0.00–0.07)
Basophils Absolute: 0.1 10*3/uL (ref 0.0–0.1)
Basophils Relative: 1 %
Eosinophils Absolute: 0.1 10*3/uL (ref 0.0–0.5)
Eosinophils Relative: 1 %
HCT: 29.4 % — ABNORMAL LOW (ref 36.0–46.0)
Hemoglobin: 10 g/dL — ABNORMAL LOW (ref 12.0–15.0)
Immature Granulocytes: 0 %
Lymphocytes Relative: 27 %
Lymphs Abs: 2.8 10*3/uL (ref 0.7–4.0)
MCH: 28.8 pg (ref 26.0–34.0)
MCHC: 34 g/dL (ref 30.0–36.0)
MCV: 84.7 fL (ref 80.0–100.0)
Monocytes Absolute: 0.7 10*3/uL (ref 0.1–1.0)
Monocytes Relative: 7 %
Neutro Abs: 6.5 10*3/uL (ref 1.7–7.7)
Neutrophils Relative %: 64 %
Platelets: 307 10*3/uL (ref 150–400)
RBC: 3.47 MIL/uL — ABNORMAL LOW (ref 3.87–5.11)
RDW: 15.6 % — ABNORMAL HIGH (ref 11.5–15.5)
WBC: 10.2 10*3/uL (ref 4.0–10.5)
nRBC: 0 % (ref 0.0–0.2)

## 2022-11-30 LAB — TROPONIN I (HIGH SENSITIVITY): Troponin I (High Sensitivity): 5 ng/L (ref <18)

## 2022-11-30 MED ORDER — DILTIAZEM HCL 30 MG PO TABS: 30.0000 mg | ORAL_TABLET | Freq: Two times a day (BID) | ORAL | 0 refills | Status: DC | PRN

## 2022-11-30 MED ORDER — APIXABAN 5 MG PO TABS: 5.0000 mg | ORAL_TABLET | Freq: Two times a day (BID) | ORAL | 0 refills | Status: DC

## 2022-11-30 NOTE — ED Notes (Signed)
Ambulated pt in the hallway. Pt reports no dizziness or sob.

## 2022-11-30 NOTE — ED Provider Notes (Signed)
Patient seen by Dr. Derrill Kay primarily, plan was to f/u with Cardiology re: outpt medication adjustments for new onset AFib. I reviewed her labs and prior vitals which show she is fairly consistently in the 60s HR and 110s systolic. Do not feel she could increase her BB at this time. She could possibly decrease her Imdur in the event of hypotension and orthostasis, but will defer this to Cardiology. Given severity and recurrence of her sx, will start on Eliquis and give a low-dose of dilt PRN if her HR remains >120 consistently at home, with instructions to seek medical attention. Return precautions given. Encouraged her to call Dr. Glennis Brink office in AM.   Shaune Pollack, MD 11/30/22 [phone]

## 2022-11-30 NOTE — Discharge Instructions (Signed)
For your symptoms:  Start the [facility] blood thinner. This is to prevent strokes associated with your abnormal heart rhythm.  You were found to be in ATRIAL FIBRILLATION, which is a common arrhythmia that can cause symptoms of chest pain, shortness of breath, and fatigue  For now, start measuring your HR and BP twice a day to keep record  Continue your current medications for now. IF you notice your blood pressure is consistently low (<110/80), you can STOP or HOLD the Imdur that was started recently  IF YOU DEVELOP CHEST PAIN, SHORTNESS OF BREATH, PALPITATIONS, OR LIGHTHEADEDNESS, CHECK YOUR HR AND BLOOD PRESSURE   IF YOUR HR IS ELEVATED >110 CONSISTENTLY, STOP, REST, AND RE-CHECK IN 10 MINUTES  IF YOU HR REMAINS ELEVATED AND YOU HAVE SYMPTOMS, YOU CAN TAKE ONE DOSE OF THE DILTIAZEM SHORT-ACTING MEDICATION AND SEE IF IT RESOLVES IN 20-30 MINUTES  IF NOT COME TO THE ER, OR IF YOU DEVELOP ANY WORSENING SYMPTOMS  DO NOT TAKE THE DILTIAZEM IF YOUR BLOOD PRESSURE IS LOWER THAN 110 ON TOP OR 70 ON THE BOTTOM

## 2022-11-30 NOTE — ED Provider Notes (Signed)
[facility] Provider Note    Event Date/Time   First MD Initiated Contact with Patient 11/30/22 1314     (approximate)   History   Near Syncope   HPI  [name] is a 72 y.o. female who presents to the emergency department today because of concerns for a near syncopal episode.  She was getting out of bed when she felt like she was going to pass out.  She laid back down and then tried again and again felt weak.  She has had this happen to her somewhat frequently recently.  When this occurs she does feel heaviness in her chest.  It does go between her shoulder blades.  The patient denies any recent fevers or illness.     Physical Exam   Triage Vital Signs: ED Triage Vitals [11/30/22 1241]  Encounter Vitals Group     BP (!) 132/111     Systolic BP Percentile      Diastolic BP Percentile      Pulse Rate (!) 126     Resp 20     Temp 98.6 F (37 C)     Temp src      SpO2 99 %     Weight 146 lb (66.2 kg)     Height 5\' 1"  (1.549 m)     Head Circumference      Peak Flow      Pain Score 3     Pain Loc      Pain Education      Exclude from Growth Chart     Most recent vital signs: Vitals:   11/30/22 1300 11/30/22 1330  BP: (!) 116/46 (!) 122/59  Pulse: 68 72  Resp: 18 14  Temp:    SpO2: 100% 100%   General: Awake, alert, oriented. CV:  Good peripheral perfusion. Regular rate and rhythm. Systolic Murmur Resp:  Normal effort. Lungs clear. Abd:  No distention.    ED Results / Procedures / Treatments   Labs (all labs ordered are listed, but only abnormal results are displayed) Labs Reviewed  CBC WITH DIFFERENTIAL/PLATELET - Abnormal; Notable for the following components:      Result Value   RBC 3.47 (*)    Hemoglobin 10.0 (*)    HCT 29.4 (*)    RDW 15.6 (*)    All other components within normal limits  BASIC METABOLIC PANEL - Abnormal; Notable for the following components:   Calcium 7.9 (*)    All other components within normal  limits  TROPONIN I (HIGH SENSITIVITY)     EKG  I, Phineas Semen, attending physician, personally viewed and interpreted this EKG  EKG Time: 1243 Rate: 133 Rhythm: atrial fibrillation with RVR Axis: normal Intervals: qtc 449 QRS: narrow, q waves v1 ST changes: no st elevation Impression: normal ekg  I, Phineas Semen, attending physician, personally viewed and interpreted this EKG  EKG Time: 1443 Rate: 62 Rhythm: sinus rhythm Axis: normal Intervals: qtc 413 QRS: narrow ST changes: no st elevation, q waves II, III, aVF Impression: abnormal ekg   RADIOLOGY None  PROCEDURES:  Critical Care performed: No   MEDICATIONS ORDERED IN ED: Medications - No data to display   IMPRESSION / MDM / ASSESSMENT AND PLAN / ED COURSE  I reviewed the triage vital signs and the nursing notes.  Differential diagnosis includes, but is not limited to, arrythmia, anemia, electrolyte abnormality, acs  Patient's presentation is most consistent with acute presentation with potential threat to life or bodily function.   The patient is on the cardiac monitor to evaluate for evidence of arrhythmia and/or significant heart rate changes.  Patient presented to the emergency department today after a near syncopal episode.  Initial EKG was concerning for A-fib with RVR.  At the time my exam however patient had spontaneously converted back into a normal sinus rhythm.  She did feel improvement.  I do wonder if patient's previous episodes could be related to paroxysmal A-fib.  Will check blood work to evaluate for any concerning electrolyte abnormalities, anemia or cardiac injury.  Will continue to have patient on cardiac monitor.  Blood work without concerning electrolyte abnormalities. Patient did feel better will talk to cardiology about rate control in the future, patient is already on toprol xl 25 BID and anticoagulation.      FINAL CLINICAL IMPRESSION(S) / ED  DIAGNOSES   Final diagnoses:  Atrial fibrillation, unspecified type [facility])     Note:  This document was prepared using Dragon voice recognition software and may include unintentional dictation errors.    Phineas Semen, MD 11/30/22 [phone]

## 2022-11-30 NOTE — ED Triage Notes (Signed)
Pt to ED via EMS from home, pt reports when she got up this morning she felt like she was going to have syncopal episode. Pt states she also had heaviness and tightness in her chest. Pt states for the past few weeks she has had inc shortness of breath on exertion.

## 2022-12-02 DIAGNOSIS — I480 Paroxysmal atrial fibrillation: Secondary | ICD-10-CM | POA: Insufficient documentation

## 2022-12-02 DIAGNOSIS — E782 Mixed hyperlipidemia: Secondary | ICD-10-CM | POA: Diagnosis not present

## 2022-12-02 DIAGNOSIS — R079 Chest pain, unspecified: Secondary | ICD-10-CM | POA: Diagnosis not present

## 2022-12-02 DIAGNOSIS — I4891 Unspecified atrial fibrillation: Secondary | ICD-10-CM | POA: Diagnosis not present

## 2022-12-15 ENCOUNTER — Telehealth (HOSPITAL_COMMUNITY): Payer: Self-pay | Admitting: *Deleted

## 2022-12-15 NOTE — Telephone Encounter (Signed)

## 2022-12-16 ENCOUNTER — Ambulatory Visit
Admission: RE | Admit: 2022-12-16 | Discharge: 2022-12-16 | Disposition: A | Discharge status: Home / Self Care | Payer: PPO | Source: Ambulatory Visit | Attending: Physician Assistant | Admitting: Physician Assistant

## 2022-12-16 ENCOUNTER — Other Ambulatory Visit: Payer: Self-pay | Admitting: Physician Assistant

## 2022-12-16 DIAGNOSIS — R079 Chest pain, unspecified: Secondary | ICD-10-CM | POA: Insufficient documentation

## 2022-12-16 MED ORDER — GADOBUTROL 1 MMOL/ML IV SOLN
9.0000 mL | Freq: Once | INTRAVENOUS | Status: AC | PRN
  Administered 2022-12-16: 9 mL via INTRAVENOUS

## 2022-12-22 DIAGNOSIS — D500 Iron deficiency anemia secondary to blood loss (chronic): Secondary | ICD-10-CM | POA: Diagnosis not present

## 2022-12-23 ENCOUNTER — Ambulatory Visit: Admission: RE | Admit: 2022-12-23 | Payer: PPO | Source: Ambulatory Visit

## 2022-12-28 DIAGNOSIS — D500 Iron deficiency anemia secondary to blood loss (chronic): Secondary | ICD-10-CM | POA: Diagnosis not present

## 2022-12-28 DIAGNOSIS — I480 Paroxysmal atrial fibrillation: Secondary | ICD-10-CM | POA: Diagnosis not present

## 2023-01-06 ENCOUNTER — Other Ambulatory Visit: Payer: Self-pay

## 2023-01-06 ENCOUNTER — Emergency Department: Payer: PPO

## 2023-01-06 ENCOUNTER — Emergency Department
Admission: EM | Admit: 2023-01-06 | Discharge: 2023-01-06 | Disposition: A | Discharge status: Home / Self Care | Payer: PPO | Attending: Emergency Medicine | Admitting: Emergency Medicine

## 2023-01-06 DIAGNOSIS — R0689 Other abnormalities of breathing: Secondary | ICD-10-CM | POA: Diagnosis not present

## 2023-01-06 DIAGNOSIS — R1010 Upper abdominal pain, unspecified: Secondary | ICD-10-CM | POA: Diagnosis not present

## 2023-01-06 DIAGNOSIS — I959 Hypotension, unspecified: Secondary | ICD-10-CM | POA: Diagnosis not present

## 2023-01-06 DIAGNOSIS — R1084 Generalized abdominal pain: Secondary | ICD-10-CM | POA: Diagnosis not present

## 2023-01-06 DIAGNOSIS — K449 Diaphragmatic hernia without obstruction or gangrene: Secondary | ICD-10-CM | POA: Diagnosis not present

## 2023-01-06 DIAGNOSIS — R110 Nausea: Secondary | ICD-10-CM | POA: Diagnosis not present

## 2023-01-06 DIAGNOSIS — M549 Dorsalgia, unspecified: Secondary | ICD-10-CM | POA: Diagnosis not present

## 2023-01-06 DIAGNOSIS — R1013 Epigastric pain: Secondary | ICD-10-CM | POA: Diagnosis not present

## 2023-01-06 DIAGNOSIS — R109 Unspecified abdominal pain: Secondary | ICD-10-CM

## 2023-01-06 DIAGNOSIS — D72829 Elevated white blood cell count, unspecified: Secondary | ICD-10-CM | POA: Diagnosis not present

## 2023-01-06 DIAGNOSIS — Z1152 Encounter for screening for COVID-19: Secondary | ICD-10-CM | POA: Insufficient documentation

## 2023-01-06 DIAGNOSIS — U071 COVID-19: Secondary | ICD-10-CM | POA: Insufficient documentation

## 2023-01-06 DIAGNOSIS — D649 Anemia, unspecified: Secondary | ICD-10-CM | POA: Insufficient documentation

## 2023-01-06 DIAGNOSIS — Q631 Lobulated, fused and horseshoe kidney: Secondary | ICD-10-CM | POA: Diagnosis not present

## 2023-01-06 DIAGNOSIS — R112 Nausea with vomiting, unspecified: Secondary | ICD-10-CM | POA: Diagnosis not present

## 2023-01-06 DIAGNOSIS — N200 Calculus of kidney: Secondary | ICD-10-CM | POA: Diagnosis not present

## 2023-01-06 LAB — CBC WITH DIFFERENTIAL/PLATELET
Abs Immature Granulocytes: 0.03 10*3/uL (ref 0.00–0.07)
Basophils Absolute: 0.1 10*3/uL (ref 0.0–0.1)
Basophils Relative: 0 %
Eosinophils Absolute: 0 10*3/uL (ref 0.0–0.5)
Eosinophils Relative: 0 %
HCT: 30.8 % — ABNORMAL LOW (ref 36.0–46.0)
Hemoglobin: 10.4 g/dL — ABNORMAL LOW (ref 12.0–15.0)
Immature Granulocytes: 0 %
Lymphocytes Relative: 11 %
Lymphs Abs: 1.4 10*3/uL (ref 0.7–4.0)
MCH: 29 pg (ref 26.0–34.0)
MCHC: 33.8 g/dL (ref 30.0–36.0)
MCV: 85.8 fL (ref 80.0–100.0)
Monocytes Absolute: 0.7 10*3/uL (ref 0.1–1.0)
Monocytes Relative: 5 %
Neutro Abs: 10.3 10*3/uL — ABNORMAL HIGH (ref 1.7–7.7)
Neutrophils Relative %: 84 %
Platelets: 241 10*3/uL (ref 150–400)
RBC: 3.59 MIL/uL — ABNORMAL LOW (ref 3.87–5.11)
RDW: 15.1 % (ref 11.5–15.5)
WBC: 12.5 10*3/uL — ABNORMAL HIGH (ref 4.0–10.5)
nRBC: 0 % (ref 0.0–0.2)

## 2023-01-06 LAB — COMPREHENSIVE METABOLIC PANEL
ALT: 35 U/L (ref 0–44)
AST: 42 U/L — ABNORMAL HIGH (ref 15–41)
Albumin: 3.7 g/dL (ref 3.5–5.0)
Alkaline Phosphatase: 109 U/L (ref 38–126)
Anion gap: 9 (ref 5–15)
BUN: 10 mg/dL (ref 8–23)
CO2: 21 mmol/L — ABNORMAL LOW (ref 22–32)
Calcium: 8.3 mg/dL — ABNORMAL LOW (ref 8.9–10.3)
Chloride: 105 mmol/L (ref 98–111)
Creatinine, Ser: 0.79 mg/dL (ref 0.44–1.00)
GFR, Estimated: >60 mL/min (ref >60)
Glucose, Bld: 95 mg/dL (ref 70–99)
Potassium: 3.7 mmol/L (ref 3.5–5.1)
Sodium: 135 mmol/L (ref 135–145)
Total Bilirubin: 0.9 mg/dL (ref 0.3–1.2)
Total Protein: 6.3 g/dL — ABNORMAL LOW (ref 6.5–8.1)

## 2023-01-06 LAB — TROPONIN I (HIGH SENSITIVITY): Troponin I (High Sensitivity): 4 ng/L (ref <18)

## 2023-01-06 LAB — RESP PANEL BY RT-PCR (RSV, FLU A&B, COVID)  RVPGX2
Influenza A by PCR: NEGATIVE
Influenza B by PCR: NEGATIVE
Resp Syncytial Virus by PCR: NEGATIVE
SARS Coronavirus 2 by RT PCR: POSITIVE — AB

## 2023-01-06 LAB — LIPASE, BLOOD: Lipase: 31 U/L (ref 11–51)

## 2023-01-06 MED ORDER — METOCLOPRAMIDE HCL 5 MG/ML IJ SOLN
10.0000 mg | Freq: Once | INTRAMUSCULAR | Status: AC
  Administered 2023-01-06: 10 mg via INTRAVENOUS
  Filled 2023-01-06: qty 2

## 2023-01-06 MED ORDER — IOHEXOL 300 MG/ML  SOLN
100.0000 mL | Freq: Once | INTRAMUSCULAR | Status: AC | PRN
  Administered 2023-01-06: 100 mL via INTRAVENOUS

## 2023-01-06 MED ORDER — SODIUM CHLORIDE 0.9 % IV BOLUS: 1000.0000 mL | Freq: Once | INTRAVENOUS | Status: DC

## 2023-01-06 MED ORDER — METOCLOPRAMIDE HCL 10 MG PO TABS: 10.0000 mg | ORAL_TABLET | Freq: Three times a day (TID) | ORAL | 1 refills | Status: DC | PRN

## 2023-01-06 MED ORDER — METOCLOPRAMIDE HCL 10 MG PO TABS
5.0000 mg | ORAL_TABLET | Freq: Once | ORAL | Status: AC
  Administered 2023-01-06: 5 mg via ORAL
  Filled 2023-01-06: qty 1

## 2023-01-06 NOTE — ED Notes (Signed)
Assumed care of pt at this time. Pt is AAXO4, on CCM, VS stable and WNL. Family members x 2 at bedside. Pt assisted to bedside toilet.

## 2023-01-06 NOTE — ED Notes (Signed)
 Provided pt with discharge instructions and education. All of pt questions answered. Pt in possession of all belongings. Pt AAOX4 and stable at time of discharge.Pt wheeled to family vehicle safely.

## 2023-01-06 NOTE — ED Provider Notes (Signed)
[facility] Provider Note    Event Date/Time   First MD Initiated Contact with Patient 01/06/23 1739     (approximate)   History   Abdominal Pain   HPI  [name] is a 72 y.o. female presenting to the emergency department for evaluation of abdominal pain and vomiting starting today.  Has recently been around sick kids.  No fevers or chills.  Denies chest pain or shortness of breath.  Has had gagging and says that she has a history of laryngospasm that is chronic for her.  Received IV fluids, Zofran, fentanyl and route.  Still dry heaving on presentation to the ER.  Does additionally report upper abdominal pain.     Physical Exam   Triage Vital Signs: ED Triage Vitals  Encounter Vitals Group     BP 01/06/23 1729 (!) 143/65     Systolic BP Percentile --      Diastolic BP Percentile --      Pulse Rate 01/06/23 1729 94     Resp 01/06/23 1729 19     Temp 01/06/23 1729 99.5 F (37.5 C)     Temp Source 01/06/23 1729 Oral     SpO2 01/06/23 1729 99 %     Weight 01/06/23 1724 150 lb 2.1 oz (68.1 kg)     Height 01/06/23 1724 5\' 1"  (1.549 m)     Head Circumference --      Peak Flow --      Pain Score 01/06/23 1724 5     Pain Loc --      Pain Education --      Exclude from Growth Chart --     Most recent vital signs: Vitals:   01/06/23 1922 01/06/23 1930  BP: (!) 121/53 (!) 112/54  Pulse: 99 89  Resp:    Temp:    SpO2: 95% 96%     General: Awake, interactive  CV:  Regular rate, good peripheral perfusion.  Resp:  Lungs clear, unlabored respirations.  Abd:  Soft, nondistended, tender to palpation throughout the upper abdomen without rebound or guarding, negative Murphy sign, no lower abdominal tenderness Neuro:  Symmetric facial movement, fluid speech   ED Results / Procedures / Treatments   Labs (all labs ordered are listed, but only abnormal results are displayed) Labs Reviewed  RESP PANEL BY RT-PCR (RSV, FLU A&B, COVID)  [id number] -  Abnormal; Notable for the following components:      Result Value   SARS Coronavirus 2 by RT PCR POSITIVE (*)    All other components within normal limits  CBC WITH DIFFERENTIAL/PLATELET - Abnormal; Notable for the following components:   WBC 12.5 (*)    RBC 3.59 (*)    Hemoglobin 10.4 (*)    HCT 30.8 (*)    Neutro Abs 10.3 (*)    All other components within normal limits  COMPREHENSIVE METABOLIC PANEL - Abnormal; Notable for the following components:   CO2 21 (*)    Calcium 8.3 (*)    Total Protein 6.3 (*)    AST 42 (*)    All other components within normal limits  LIPASE, BLOOD  TROPONIN I (HIGH SENSITIVITY)     EKG EKG independently reviewed interpreted by myself (ER attending) demonstrates:  EKG demonstrates sinus rhythm at a rate of 80, PR 150, QRS 95, QTc 447, no acute ST changes  RADIOLOGY Imaging independently reviewed and interpreted by myself demonstrates:  CT abdomen pelvis without evidence of bowel obstruction  or other acute intra-abdominal process  PROCEDURES:  Critical Care performed: No  Procedures   MEDICATIONS ORDERED IN ED: Medications  metoCLOPramide (REGLAN) injection 10 mg (10 mg Intravenous Given 01/06/23 1752)  iohexol (OMNIPAQUE) 300 MG/ML solution 100 mL (100 mLs Intravenous Contrast Given 01/06/23 1818)     IMPRESSION / MDM / ASSESSMENT AND PLAN / ED COURSE  I reviewed the triage vital signs and the nursing notes.  Differential diagnosis includes, but is not limited to, bowel obstruction, pancreatitis, other acute intra-abdominal process, much lower suspicion cardiac etiology of her epigastric pain, viral illness  Patient's presentation is most consistent with acute presentation with potential threat to life or bodily function.  72 year old female presenting to the emergency department for evaluation of vomiting and abdominal pain x 1 day.  Lab work demonstrates mild leukocytosis with WC of 12.5, stable anemia with hemoglobin of 10.4.  CMP  without significant derangement.  Normal lipase and troponin.  COVID test did return positive.  CT abdomen pelvis without acute findings.  Patient initially with ongoing dry heaving after 8 mg of Zofran, but was given Reglan with significant improvement in her symptoms.  She was able to tolerate a p.o. challenge.  She is comfortable with discharge home.  Strict return precautions were provided.  Patient was discharged in stable condition.      FINAL CLINICAL IMPRESSION(S) / ED DIAGNOSES   Final diagnoses:  Acute abdominal pain  Nausea and vomiting, unspecified vomiting type  COVID-19     Rx / DC Orders   ED Discharge Orders          Ordered    metoCLOPramide (REGLAN) 10 MG tablet  Every 8 hours PRN        01/06/23 1954             Note:  This document was prepared using Dragon voice recognition software and may include unintentional dictation errors.   Trinna Post, MD 01/07/23 [phone]

## 2023-01-06 NOTE — ED Notes (Signed)
MD provided pt with ginger ale for PO trial. Pt able to safely be off of O2 for toilet

## 2023-01-06 NOTE — ED Triage Notes (Signed)
Pt arrives via [facility] for abdominal pain and vomiting that started today. Pt reports recent contact with sick kids. Pt given 1L LR, NS, 8mg  zofran and Fentanyl en route. Pt continues to dry heave on arrival to ER. Per EMS, CBG 104.

## 2023-01-06 NOTE — ED Notes (Signed)
Patient transported to CT 

## 2023-01-06 NOTE — ED Notes (Signed)
ED Provider at bedside. 

## 2023-01-06 NOTE — Discharge Instructions (Addendum)
You were seen in the emergency department today for evaluation of your vomiting and abdominal pain.  Your COVID test did return positive.  Your testing was otherwise reassuring.  I sent a prescription for nausea medicine to your pharmacy that you can take as needed.  Follow with your primary care doctor for further evaluation.  Return to the ER for new or worsening symptoms.

## 2023-01-07 DIAGNOSIS — I480 Paroxysmal atrial fibrillation: Secondary | ICD-10-CM | POA: Diagnosis not present

## 2023-01-20 DIAGNOSIS — Z8249 Family history of ischemic heart disease and other diseases of the circulatory system: Secondary | ICD-10-CM | POA: Diagnosis not present

## 2023-01-20 DIAGNOSIS — Z23 Encounter for immunization: Secondary | ICD-10-CM | POA: Diagnosis not present

## 2023-01-20 DIAGNOSIS — I480 Paroxysmal atrial fibrillation: Secondary | ICD-10-CM | POA: Diagnosis not present

## 2023-01-20 DIAGNOSIS — E782 Mixed hyperlipidemia: Secondary | ICD-10-CM | POA: Diagnosis not present

## 2023-01-25 ENCOUNTER — Ambulatory Visit: Payer: PPO

## 2023-01-25 DIAGNOSIS — K200 Eosinophilic esophagitis: Secondary | ICD-10-CM | POA: Diagnosis not present

## 2023-01-25 DIAGNOSIS — K2960 Other gastritis without bleeding: Secondary | ICD-10-CM | POA: Diagnosis not present

## 2023-01-25 DIAGNOSIS — K222 Esophageal obstruction: Secondary | ICD-10-CM | POA: Diagnosis not present

## 2023-03-21 DIAGNOSIS — Z03818 Encounter for observation for suspected exposure to other biological agents ruled out: Secondary | ICD-10-CM | POA: Diagnosis not present

## 2023-03-22 DIAGNOSIS — E782 Mixed hyperlipidemia: Secondary | ICD-10-CM | POA: Diagnosis not present

## 2023-03-22 DIAGNOSIS — D500 Iron deficiency anemia secondary to blood loss (chronic): Secondary | ICD-10-CM | POA: Diagnosis not present

## 2023-03-22 DIAGNOSIS — E538 Deficiency of other specified B group vitamins: Secondary | ICD-10-CM | POA: Diagnosis not present

## 2023-03-29 DIAGNOSIS — E538 Deficiency of other specified B group vitamins: Secondary | ICD-10-CM | POA: Diagnosis not present

## 2023-03-29 DIAGNOSIS — F330 Major depressive disorder, recurrent, mild: Secondary | ICD-10-CM | POA: Diagnosis not present

## 2023-03-29 DIAGNOSIS — Z23 Encounter for immunization: Secondary | ICD-10-CM | POA: Diagnosis not present

## 2023-03-29 DIAGNOSIS — E782 Mixed hyperlipidemia: Secondary | ICD-10-CM | POA: Diagnosis not present

## 2023-03-29 DIAGNOSIS — D500 Iron deficiency anemia secondary to blood loss (chronic): Secondary | ICD-10-CM | POA: Diagnosis not present

## 2023-03-29 DIAGNOSIS — Z0000 Encounter for general adult medical examination without abnormal findings: Secondary | ICD-10-CM | POA: Diagnosis not present

## 2023-03-29 DIAGNOSIS — R739 Hyperglycemia, unspecified: Secondary | ICD-10-CM | POA: Diagnosis not present

## 2023-06-25 ENCOUNTER — Inpatient Hospital Stay: Payer: 59

## 2023-06-25 ENCOUNTER — Emergency Department: Payer: 59

## 2023-06-25 ENCOUNTER — Telehealth: Payer: Self-pay

## 2023-06-25 ENCOUNTER — Other Ambulatory Visit: Payer: Self-pay

## 2023-06-25 ENCOUNTER — Encounter
Admission: EM | Disposition: A | Discharge status: Skilled Nursing Facility | Payer: Self-pay | Source: Home / Self Care | Attending: Obstetrics and Gynecology

## 2023-06-25 ENCOUNTER — Inpatient Hospital Stay
Admission: EM | Admit: 2023-06-25 | Discharge: 2023-07-12 | DRG: 025 | Disposition: A | Discharge status: Skilled Nursing Facility | Payer: 59 | Attending: Obstetrics and Gynecology | Admitting: Obstetrics and Gynecology

## 2023-06-25 DIAGNOSIS — S46211A Strain of muscle, fascia and tendon of other parts of biceps, right arm, initial encounter: Secondary | ICD-10-CM | POA: Diagnosis present

## 2023-06-25 DIAGNOSIS — D62 Acute posthemorrhagic anemia: Secondary | ICD-10-CM | POA: Diagnosis present

## 2023-06-25 DIAGNOSIS — R197 Diarrhea, unspecified: Secondary | ICD-10-CM | POA: Diagnosis not present

## 2023-06-25 DIAGNOSIS — I480 Paroxysmal atrial fibrillation: Secondary | ICD-10-CM | POA: Diagnosis present

## 2023-06-25 DIAGNOSIS — R4701 Aphasia: Secondary | ICD-10-CM | POA: Diagnosis present

## 2023-06-25 DIAGNOSIS — E871 Hypo-osmolality and hyponatremia: Secondary | ICD-10-CM | POA: Diagnosis not present

## 2023-06-25 DIAGNOSIS — R1314 Dysphagia, pharyngoesophageal phase: Secondary | ICD-10-CM | POA: Diagnosis not present

## 2023-06-25 DIAGNOSIS — T83511A Infection and inflammatory reaction due to indwelling urethral catheter, initial encounter: Secondary | ICD-10-CM | POA: Diagnosis not present

## 2023-06-25 DIAGNOSIS — W19XXXA Unspecified fall, initial encounter: Secondary | ICD-10-CM | POA: Diagnosis present

## 2023-06-25 DIAGNOSIS — G839 Paralytic syndrome, unspecified: Secondary | ICD-10-CM | POA: Diagnosis present

## 2023-06-25 DIAGNOSIS — F32A Depression, unspecified: Secondary | ICD-10-CM | POA: Diagnosis present

## 2023-06-25 DIAGNOSIS — N3000 Acute cystitis without hematuria: Secondary | ICD-10-CM

## 2023-06-25 DIAGNOSIS — N390 Urinary tract infection, site not specified: Secondary | ICD-10-CM | POA: Diagnosis not present

## 2023-06-25 DIAGNOSIS — Z7982 Long term (current) use of aspirin: Secondary | ICD-10-CM

## 2023-06-25 DIAGNOSIS — R4702 Dysphasia: Secondary | ICD-10-CM | POA: Diagnosis not present

## 2023-06-25 DIAGNOSIS — E785 Hyperlipidemia, unspecified: Secondary | ICD-10-CM | POA: Diagnosis present

## 2023-06-25 DIAGNOSIS — Z8249 Family history of ischemic heart disease and other diseases of the circulatory system: Secondary | ICD-10-CM

## 2023-06-25 DIAGNOSIS — S065XAA Traumatic subdural hemorrhage with loss of consciousness status unknown, initial encounter: Secondary | ICD-10-CM

## 2023-06-25 DIAGNOSIS — B370 Candidal stomatitis: Secondary | ICD-10-CM | POA: Diagnosis not present

## 2023-06-25 DIAGNOSIS — Z833 Family history of diabetes mellitus: Secondary | ICD-10-CM

## 2023-06-25 DIAGNOSIS — R1310 Dysphagia, unspecified: Secondary | ICD-10-CM | POA: Diagnosis not present

## 2023-06-25 DIAGNOSIS — D6832 Hemorrhagic disorder due to extrinsic circulating anticoagulants: Secondary | ICD-10-CM | POA: Diagnosis present

## 2023-06-25 DIAGNOSIS — R531 Weakness: Secondary | ICD-10-CM | POA: Diagnosis not present

## 2023-06-25 DIAGNOSIS — Z1152 Encounter for screening for COVID-19: Secondary | ICD-10-CM

## 2023-06-25 DIAGNOSIS — R402423 Glasgow coma scale score 9-12, at hospital admission: Secondary | ICD-10-CM | POA: Diagnosis present

## 2023-06-25 DIAGNOSIS — G4733 Obstructive sleep apnea (adult) (pediatric): Secondary | ICD-10-CM | POA: Diagnosis present

## 2023-06-25 DIAGNOSIS — Z86718 Personal history of other venous thrombosis and embolism: Secondary | ICD-10-CM | POA: Diagnosis not present

## 2023-06-25 DIAGNOSIS — Z888 Allergy status to other drugs, medicaments and biological substances status: Secondary | ICD-10-CM

## 2023-06-25 DIAGNOSIS — G935 Compression of brain: Secondary | ICD-10-CM

## 2023-06-25 DIAGNOSIS — R569 Unspecified convulsions: Secondary | ICD-10-CM | POA: Diagnosis not present

## 2023-06-25 DIAGNOSIS — R414 Neurologic neglect syndrome: Secondary | ICD-10-CM | POA: Diagnosis not present

## 2023-06-25 DIAGNOSIS — B952 Enterococcus as the cause of diseases classified elsewhere: Secondary | ICD-10-CM | POA: Diagnosis present

## 2023-06-25 DIAGNOSIS — Z880 Allergy status to penicillin: Secondary | ICD-10-CM

## 2023-06-25 DIAGNOSIS — E876 Hypokalemia: Secondary | ICD-10-CM | POA: Diagnosis not present

## 2023-06-25 DIAGNOSIS — G8101 Flaccid hemiplegia affecting right dominant side: Secondary | ICD-10-CM | POA: Diagnosis present

## 2023-06-25 DIAGNOSIS — K580 Irritable bowel syndrome with diarrhea: Secondary | ICD-10-CM | POA: Diagnosis present

## 2023-06-25 DIAGNOSIS — M75101 Unspecified rotator cuff tear or rupture of right shoulder, not specified as traumatic: Secondary | ICD-10-CM | POA: Diagnosis present

## 2023-06-25 DIAGNOSIS — R402422 Glasgow coma scale score 9-12, at arrival to emergency department: Secondary | ICD-10-CM

## 2023-06-25 DIAGNOSIS — F419 Anxiety disorder, unspecified: Secondary | ICD-10-CM | POA: Diagnosis not present

## 2023-06-25 DIAGNOSIS — R40242 Glasgow coma scale score 9-12, unspecified time: Secondary | ICD-10-CM

## 2023-06-25 DIAGNOSIS — Z7901 Long term (current) use of anticoagulants: Secondary | ICD-10-CM

## 2023-06-25 DIAGNOSIS — I639 Cerebral infarction, unspecified: Secondary | ICD-10-CM | POA: Diagnosis not present

## 2023-06-25 DIAGNOSIS — G9341 Metabolic encephalopathy: Secondary | ICD-10-CM | POA: Diagnosis present

## 2023-06-25 DIAGNOSIS — I10 Essential (primary) hypertension: Secondary | ICD-10-CM | POA: Diagnosis present

## 2023-06-25 DIAGNOSIS — S065X9D Traumatic subdural hemorrhage with loss of consciousness of unspecified duration, subsequent encounter: Secondary | ICD-10-CM | POA: Diagnosis not present

## 2023-06-25 DIAGNOSIS — Z79899 Other long term (current) drug therapy: Secondary | ICD-10-CM

## 2023-06-25 HISTORY — PX: CRANIOTOMY: SHX93

## 2023-06-25 LAB — CBC WITH DIFFERENTIAL/PLATELET
Abs Immature Granulocytes: 0.01 10*3/uL (ref 0.00–0.07)
Basophils Absolute: 0 10*3/uL (ref 0.0–0.1)
Basophils Relative: 0 %
Eosinophils Absolute: 0.1 10*3/uL (ref 0.0–0.5)
Eosinophils Relative: 1 %
HCT: 18.3 % — ABNORMAL LOW (ref 36.0–46.0)
Hemoglobin: 6.1 g/dL — ABNORMAL LOW (ref 12.0–15.0)
Immature Granulocytes: 0 %
Lymphocytes Relative: 45 %
Lymphs Abs: 2.9 10*3/uL (ref 0.7–4.0)
MCH: 31.6 pg (ref 26.0–34.0)
MCHC: 33.3 g/dL (ref 30.0–36.0)
MCV: 94.8 fL (ref 80.0–100.0)
Monocytes Absolute: 0.5 10*3/uL (ref 0.1–1.0)
Monocytes Relative: 9 %
Neutro Abs: 2.9 10*3/uL (ref 1.7–7.7)
Neutrophils Relative %: 45 %
Platelets: 143 10*3/uL — ABNORMAL LOW (ref 150–400)
RBC: 1.93 MIL/uL — ABNORMAL LOW (ref 3.87–5.11)
RDW: 13.9 % (ref 11.5–15.5)
WBC: 6.4 10*3/uL (ref 4.0–10.5)
nRBC: 0 % (ref 0.0–0.2)

## 2023-06-25 LAB — TROPONIN I (HIGH SENSITIVITY)
Troponin I (High Sensitivity): 3 ng/L (ref <18)
Troponin I (High Sensitivity): 8 ng/L (ref <18)

## 2023-06-25 LAB — LACTIC ACID, PLASMA
Lactic Acid, Venous: 1.2 mmol/L (ref 0.5–1.9)
Lactic Acid, Venous: 1 mmol/L (ref 0.5–1.9)

## 2023-06-25 LAB — CBC
HCT: 36.5 % (ref 36.0–46.0)
Hemoglobin: 12.9 g/dL (ref 12.0–15.0)
MCH: 30.9 pg (ref 26.0–34.0)
MCHC: 35.3 g/dL (ref 30.0–36.0)
MCV: 87.5 fL (ref 80.0–100.0)
Platelets: 197 10*3/uL (ref 150–400)
RBC: 4.17 MIL/uL (ref 3.87–5.11)
RDW: 14.7 % (ref 11.5–15.5)
WBC: 13.9 10*3/uL — ABNORMAL HIGH (ref 4.0–10.5)
nRBC: 0 % (ref 0.0–0.2)

## 2023-06-25 LAB — COMPREHENSIVE METABOLIC PANEL
ALT: 21 U/L (ref 0–44)
AST: 22 U/L (ref 15–41)
Albumin: 4.1 g/dL (ref 3.5–5.0)
Alkaline Phosphatase: 94 U/L (ref 38–126)
Anion gap: 11 (ref 5–15)
BUN: 10 mg/dL (ref 8–23)
CO2: 27 mmol/L (ref 22–32)
Calcium: 9.4 mg/dL (ref 8.9–10.3)
Chloride: 100 mmol/L (ref 98–111)
Creatinine, Ser: 0.75 mg/dL (ref 0.44–1.00)
GFR, Estimated: >60 mL/min (ref >60)
Glucose, Bld: 103 mg/dL — ABNORMAL HIGH (ref 70–99)
Potassium: 4.1 mmol/L (ref 3.5–5.1)
Sodium: 138 mmol/L (ref 135–145)
Total Bilirubin: 0.9 mg/dL (ref 0.0–1.2)
Total Protein: 6.9 g/dL (ref 6.5–8.1)

## 2023-06-25 LAB — PROTIME-INR
INR: 1.4 — ABNORMAL HIGH (ref 0.8–1.2)
Prothrombin Time: 17.4 s — ABNORMAL HIGH (ref 11.4–15.2)

## 2023-06-25 LAB — PREPARE RBC (CROSSMATCH)

## 2023-06-25 LAB — SODIUM: Sodium: 136 mmol/L (ref 135–145)

## 2023-06-25 SURGERY — CRANIOTOMY HEMATOMA EVACUATION SUBDURAL
Anesthesia: General | Laterality: Left

## 2023-06-25 MED ORDER — ONDANSETRON HCL 4 MG/2ML IJ SOLN
INTRAMUSCULAR | Status: AC
  Filled 2023-06-25: qty 2

## 2023-06-25 MED ORDER — POLYETHYLENE GLYCOL 3350 17 G PO PACK
17.0000 g | PACK | Freq: Every day | ORAL | Status: DC | PRN
  Administered 2023-06-29 – 2023-07-02 (×3): 17 g via ORAL
  Filled 2023-06-25 (×3): qty 1

## 2023-06-25 MED ORDER — CEFAZOLIN SODIUM-DEXTROSE 2-3 GM-%(50ML) IV SOLR
INTRAVENOUS | Status: DC | PRN
  Administered 2023-06-25: 2 g via INTRAVENOUS

## 2023-06-25 MED ORDER — PROPOFOL 10 MG/ML IV BOLUS
INTRAVENOUS | Status: AC
  Filled 2023-06-25: qty 20

## 2023-06-25 MED ORDER — LEVETIRACETAM IN NACL 500 MG/100ML IV SOLN: 500.0000 mg | Freq: Two times a day (BID) | INTRAVENOUS | Status: DC

## 2023-06-25 MED ORDER — ROCURONIUM BROMIDE 10 MG/ML (PF) SYRINGE
PREFILLED_SYRINGE | INTRAVENOUS | Status: AC
Start: 2023-06-25 — End: ?
  Filled 2023-06-25: qty 10

## 2023-06-25 MED ORDER — LORAZEPAM 2 MG/ML IJ SOLN
0.5000 mg | INTRAMUSCULAR | Status: DC | PRN
  Administered 2023-06-25 – 2023-06-26 (×2): 0.5 mg via INTRAVENOUS
  Filled 2023-06-25 (×2): qty 1

## 2023-06-25 MED ORDER — SUGAMMADEX SODIUM 200 MG/2ML IV SOLN
INTRAVENOUS | Status: DC | PRN
  Administered 2023-06-25: 200 mg via INTRAVENOUS

## 2023-06-25 MED ORDER — EMPTY CONTAINERS FLEXIBLE MISC
900.0000 mg | Freq: Once | Status: AC
  Administered 2023-06-25: 900 mg via INTRAVENOUS
  Filled 2023-06-25: qty 90

## 2023-06-25 MED ORDER — FENTANYL CITRATE (PF) 100 MCG/2ML IJ SOLN
INTRAMUSCULAR | Status: AC
  Filled 2023-06-25: qty 2

## 2023-06-25 MED ORDER — ONDANSETRON HCL 4 MG/2ML IJ SOLN
INTRAMUSCULAR | Status: DC | PRN
  Administered 2023-06-25: 4 mg via INTRAVENOUS

## 2023-06-25 MED ORDER — DOCUSATE SODIUM 100 MG PO CAPS: 100.0000 mg | ORAL_CAPSULE | Freq: Two times a day (BID) | ORAL | Status: DC | PRN

## 2023-06-25 MED ORDER — OXYCODONE HCL 5 MG/5ML PO SOLN: 5.0000 mg | Freq: Once | ORAL | Status: DC | PRN

## 2023-06-25 MED ORDER — MANNITOL 20 % IV SOLN: INTRAVENOUS | Status: DC | PRN

## 2023-06-25 MED ORDER — FENTANYL CITRATE (PF) 100 MCG/2ML IJ SOLN: 25.0000 ug | INTRAMUSCULAR | Status: DC | PRN

## 2023-06-25 MED ORDER — PROPOFOL 10 MG/ML IV BOLUS
INTRAVENOUS | Status: DC | PRN
  Administered 2023-06-25 (×2): 50 mg via INTRAVENOUS
  Administered 2023-06-25: 75 ug/kg/min via INTRAVENOUS
  Administered 2023-06-25: 50 mg via INTRAVENOUS

## 2023-06-25 MED ORDER — KETOROLAC TROMETHAMINE 15 MG/ML IJ SOLN
15.0000 mg | Freq: Four times a day (QID) | INTRAMUSCULAR | Status: DC | PRN
  Administered 2023-06-25: 15 mg via INTRAVENOUS
  Filled 2023-06-25: qty 1

## 2023-06-25 MED ORDER — FAMOTIDINE IN NACL 20-0.9 MG/50ML-% IV SOLN
20.0000 mg | Freq: Two times a day (BID) | INTRAVENOUS | Status: DC
  Administered 2023-06-26 – 2023-07-02 (×14): 20 mg via INTRAVENOUS
  Filled 2023-06-25 (×14): qty 50

## 2023-06-25 MED ORDER — SODIUM CHLORIDE 3 % IV BOLUS
250.0000 mL | Freq: Once | INTRAVENOUS | Status: DC
  Filled 2023-06-25: qty 500

## 2023-06-25 MED ORDER — NITROGLYCERIN IN D5W 200-5 MCG/ML-% IV SOLN
INTRAVENOUS | Status: AC
  Filled 2023-06-25: qty 250

## 2023-06-25 MED ORDER — FENTANYL CITRATE (PF) 100 MCG/2ML IJ SOLN
INTRAMUSCULAR | Status: DC | PRN
  Administered 2023-06-25 (×2): 50 ug via INTRAVENOUS

## 2023-06-25 MED ORDER — REMIFENTANIL HCL 1 MG IV SOLR
INTRAVENOUS | Status: AC
  Filled 2023-06-25: qty 1000

## 2023-06-25 MED ORDER — BACITRACIN 500 UNIT/GM EX OINT
TOPICAL_OINTMENT | CUTANEOUS | Status: DC | PRN
  Administered 2023-06-25: 1 via TOPICAL

## 2023-06-25 MED ORDER — MANNITOL 20 % IV SOLN
INTRAVENOUS | Status: AC
  Filled 2023-06-25: qty 500

## 2023-06-25 MED ORDER — LEVETIRACETAM IN NACL 500 MG/100ML IV SOLN
500.0000 mg | Freq: Two times a day (BID) | INTRAVENOUS | Status: DC
  Administered 2023-06-26 – 2023-07-05 (×20): 500 mg via INTRAVENOUS
  Filled 2023-06-25 (×21): qty 100

## 2023-06-25 MED ORDER — SURGIFLO WITH THROMBIN (HEMOSTATIC MATRIX KIT) OPTIME
TOPICAL | Status: DC | PRN
Start: 2023-06-25 — End: 2023-06-25
  Administered 2023-06-25: 1 via TOPICAL

## 2023-06-25 MED ORDER — DEXMEDETOMIDINE HCL IN NACL 80 MCG/20ML IV SOLN
INTRAVENOUS | Status: DC | PRN
  Administered 2023-06-25: 8 ug via INTRAVENOUS

## 2023-06-25 MED ORDER — NICARDIPINE HCL IN NACL 20-0.86 MG/200ML-% IV SOLN
3.0000 mg/h | INTRAVENOUS | Status: DC
  Administered 2023-06-25 – 2023-06-27 (×6): 5 mg/h via INTRAVENOUS
  Filled 2023-06-25 (×7): qty 200

## 2023-06-25 MED ORDER — PHENYLEPHRINE HCL-NACL 20-0.9 MG/250ML-% IV SOLN
INTRAVENOUS | Status: DC | PRN
Start: 2023-06-25 — End: 2023-06-25
  Administered 2023-06-25: 30 ug/min via INTRAVENOUS

## 2023-06-25 MED ORDER — REMIFENTANIL HCL 1 MG IV SOLR
INTRAVENOUS | Status: DC | PRN
  Administered 2023-06-25: .1 ug/kg/min via INTRAVENOUS

## 2023-06-25 MED ORDER — DEXAMETHASONE SODIUM PHOSPHATE 10 MG/ML IJ SOLN
INTRAMUSCULAR | Status: AC
  Filled 2023-06-25: qty 1

## 2023-06-25 MED ORDER — SUCCINYLCHOLINE CHLORIDE 200 MG/10ML IV SOSY
PREFILLED_SYRINGE | INTRAVENOUS | Status: DC | PRN
  Administered 2023-06-25: 80 mg via INTRAVENOUS

## 2023-06-25 MED ORDER — LORAZEPAM 2 MG/ML IJ SOLN: 1.0000 mg | Freq: Once | INTRAMUSCULAR | Status: AC

## 2023-06-25 MED ORDER — PHENYLEPHRINE HCL-NACL 20-0.9 MG/250ML-% IV SOLN
INTRAVENOUS | Status: AC
  Filled 2023-06-25: qty 250

## 2023-06-25 MED ORDER — OXYCODONE HCL 5 MG PO TABS: 5.0000 mg | ORAL_TABLET | Freq: Once | ORAL | Status: DC | PRN

## 2023-06-25 MED ORDER — ROCURONIUM BROMIDE 100 MG/10ML IV SOLN
INTRAVENOUS | Status: DC | PRN
  Administered 2023-06-25 (×2): 20 mg via INTRAVENOUS
  Administered 2023-06-25: 30 mg via INTRAVENOUS

## 2023-06-25 MED ORDER — LORAZEPAM 2 MG/ML IJ SOLN
INTRAMUSCULAR | Status: AC
  Administered 2023-06-25: 1 mg via INTRAVENOUS
  Filled 2023-06-25: qty 1

## 2023-06-25 MED ORDER — LIDOCAINE-EPINEPHRINE 1 %-1:100000 IJ SOLN
INTRAMUSCULAR | Status: DC | PRN
  Administered 2023-06-25: 10 mL

## 2023-06-25 MED ORDER — FENTANYL CITRATE PF 50 MCG/ML IJ SOSY
25.0000 ug | PREFILLED_SYRINGE | INTRAMUSCULAR | Status: DC | PRN
  Administered 2023-06-25 – 2023-06-26 (×6): 50 ug via INTRAVENOUS
  Filled 2023-06-25 (×6): qty 1

## 2023-06-25 MED ORDER — LIDOCAINE HCL (PF) 2 % IJ SOLN
INTRAMUSCULAR | Status: AC
Start: 2023-06-25 — End: ?
  Filled 2023-06-25: qty 5

## 2023-06-25 MED ORDER — EPHEDRINE SULFATE-NACL 50-0.9 MG/10ML-% IV SOSY
PREFILLED_SYRINGE | INTRAVENOUS | Status: DC | PRN
  Administered 2023-06-25: 5 mg via INTRAVENOUS

## 2023-06-25 MED ORDER — 0.9 % SODIUM CHLORIDE (POUR BTL) OPTIME
TOPICAL | Status: DC | PRN
  Administered 2023-06-25: 3000 mL

## 2023-06-25 MED ORDER — SODIUM CHLORIDE 0.9 % IV SOLN: INTRAVENOUS | Status: DC | PRN

## 2023-06-25 MED ORDER — LEVETIRACETAM IN NACL 1000 MG/100ML IV SOLN
1000.0000 mg | Freq: Once | INTRAVENOUS | Status: AC
  Administered 2023-06-25: 1000 mg via INTRAVENOUS
  Filled 2023-06-25: qty 100

## 2023-06-25 MED ORDER — ETOMIDATE 2 MG/ML IV SOLN
INTRAVENOUS | Status: DC | PRN
  Administered 2023-06-25: 18 mg via INTRAVENOUS

## 2023-06-25 MED ORDER — PROPOFOL 1000 MG/100ML IV EMUL
INTRAVENOUS | Status: AC
Start: 2023-06-25 — End: ?
  Filled 2023-06-25: qty 100

## 2023-06-25 SURGICAL SUPPLY — 64 items
APPLICATOR CHLORAPREP 10.5 ORG (MISCELLANEOUS) ×4 IMPLANT
BASIN GRAD PLASTIC 32OZ STRL (MISCELLANEOUS) ×2 IMPLANT
BLADE CLIPPER SPEC (BLADE) ×2 IMPLANT
BLADE SURG 15 STRL LF DISP TIS (BLADE) ×2 IMPLANT
BUR ACORN 7.5 PRECISION (BURR) ×2 IMPLANT
BUR SPIRAL ROUTER 2.3 (BUR) ×2 IMPLANT
CNTNR URN SCR LID CUP LEK RST (MISCELLANEOUS) ×6 IMPLANT
COUNTER NEEDLE 20/40 LG (NEEDLE) ×2 IMPLANT
DRAIN CHANNEL JP 10F RND 20C F (MISCELLANEOUS) IMPLANT
DRAIN WOUND RND W/TROCAR (DRAIN) IMPLANT
DRAPE INCISE 23X17 STRL (DRAPES) ×4 IMPLANT
DRAPE INCISE IOBAN 23X17 STRL (DRAPES) IMPLANT
DRAPE INCISE IOBAN 66X45 STRL (DRAPES) ×2 IMPLANT
DRAPE SURG 17X11 SM STRL (DRAPES) ×8 IMPLANT
DRAPE WARM FLUID 44X44 (DRAPES) ×2 IMPLANT
DRSG TEGADERM 4X4.75 (GAUZE/BANDAGES/DRESSINGS) ×2 IMPLANT
DRSG TELFA 3X8 NADH STRL (GAUZE/BANDAGES/DRESSINGS) IMPLANT
ELECT CAUTERY BLADE TIP 2.5 (TIP) ×1 IMPLANT
ELECT REM PT RETURN 9FT ADLT (ELECTROSURGICAL) ×1 IMPLANT
ELECTRODE CAUTERY BLDE TIP 2.5 (TIP) ×2 IMPLANT
ELECTRODE REM PT RTRN 9FT ADLT (ELECTROSURGICAL) ×2 IMPLANT
EVACUATOR SILICONE 100CC (DRAIN) IMPLANT
GAUZE 4X4 16PLY ~~LOC~~+RFID DBL (SPONGE) ×6 IMPLANT
GAUZE XEROFORM 1X8 LF (GAUZE/BANDAGES/DRESSINGS) ×2 IMPLANT
GLOVE SURG SYN 8.5 E (GLOVE) ×3 IMPLANT
GLOVE SURG SYN 8.5 PF PI (GLOVE) ×8 IMPLANT
GOWN SRG XL LVL 3 NONREINFORCE (GOWNS) ×2 IMPLANT
GRADUATE 1200CC STRL 31836 (MISCELLANEOUS) ×2 IMPLANT
GRAFT DURAGEN MATRIX 5WX7L (Graft) IMPLANT
HEMOSTAT SURGICEL 2X14 (HEMOSTASIS) IMPLANT
HEMOSTAT SURGICEL 2X3 (HEMOSTASIS) IMPLANT
HEMOSTAT SURGICEL 4X8 (HEMOSTASIS) IMPLANT
HOLDER FOLEY CATH W/STRAP (MISCELLANEOUS) ×2 IMPLANT
HOOK STAY BLUNT/RETRACTOR 5M (MISCELLANEOUS) ×2 IMPLANT
KIT TURNOVER KIT A (KITS) ×2 IMPLANT
MANIFOLD NEPTUNE II (INSTRUMENTS) ×2 IMPLANT
MARKER SKIN DUAL TIP RULER LAB (MISCELLANEOUS) ×4 IMPLANT
MAT ABSORB FLUID 56X50 GRAY (MISCELLANEOUS) ×2 IMPLANT
NDL HYPO 22X1.5 SAFETY MO (MISCELLANEOUS) ×2 IMPLANT
NEEDLE HYPO 22X1.5 SAFETY MO (MISCELLANEOUS) ×1 IMPLANT
NS IRRIG 1000ML POUR BTL (IV SOLUTION) IMPLANT
PACK CRANIOTOMY CUSTOM (CUSTOM PROCEDURE TRAY) ×2 IMPLANT
PAD ARMBOARD 7.5X6 YLW CONV (MISCELLANEOUS) ×4 IMPLANT
PATTIES SURGICAL .5 X3 (DISPOSABLE) IMPLANT
PIN MAYFIELD SKULL DISP (PIN) IMPLANT
PLATE 1.5 2HOLE LNG NEURO (Plate) IMPLANT
PLATE 1.5/0.5 18.5MM BURR HOLE (Plate) IMPLANT
SCREW SELF DRILL HT 1.5/4MM (Screw) IMPLANT
SET CATH VENT DRAIN 3-15 1.9D (DRAIN) IMPLANT
SHEET NEURO XL SOL CTL (MISCELLANEOUS) ×2 IMPLANT
STAPLER SKIN PROX 35W (STAPLE) ×4 IMPLANT
STRIP CLOSURE SKIN 1/4X4 (GAUZE/BANDAGES/DRESSINGS) IMPLANT
SURGIFLO W/THROMBIN 8M KIT (HEMOSTASIS) ×4 IMPLANT
SUT ETHILON 3-0 FS-10 30 BLK (SUTURE) ×1 IMPLANT
SUT MNCRL AB 3-0 PS2 27 (SUTURE) IMPLANT
SUT NURALON 4 0 TR CR/8 (SUTURE) ×2 IMPLANT
SUT VIC AB 2-0 CT1 18 (SUTURE) ×4 IMPLANT
SUTURE EHLN 3-0 FS-10 30 BLK (SUTURE) IMPLANT
SYR 10ML LL (SYRINGE) ×2 IMPLANT
SYR 20ML LL LF (SYRINGE) ×4 IMPLANT
TAPE CLOTH 3X10 WHT NS LF (GAUZE/BANDAGES/DRESSINGS) ×2 IMPLANT
TOWEL OR 17X26 4PK STRL BLUE (TOWEL DISPOSABLE) ×8 IMPLANT
TRAP FLUID SMOKE EVACUATOR (MISCELLANEOUS) ×2 IMPLANT
TRAY FOLEY SLVR 16FR TEMP STAT (SET/KITS/TRAYS/PACK) ×2 IMPLANT

## 2023-06-25 NOTE — Interval H&P Note (Signed)
History and Physical Interval Note:  06/25/2023 3:29 PM  [name]  has presented today for surgery, with the diagnosis of subdural hematoma, brain compression.  The various methods of treatment have been discussed with the patient and family. After consideration of risks, benefits and other options for treatment, the patient has consented to  Procedure(s): CRANIOTOMY HEMATOMA EVACUATION SUBDURAL (Left) as a surgical intervention.  The patient's history has been reviewed, patient examined, no change in status, stable for surgery.  I have reviewed the patient's chart and labs.    Heart sounds normal no MRG. Chest Clear to Auscultation Bilaterally.  Chaun Uemura

## 2023-06-25 NOTE — ED Notes (Signed)
1 mg ativan given IV at this time.

## 2023-06-25 NOTE — ED Notes (Signed)
Report given to OR at this time.

## 2023-06-25 NOTE — H&P (Cosign Needed Addendum)
NAME:  [name], MRN:  [id number], DOB:  [birth year], LOS: 0 ADMISSION DATE:  06/25/2023, CONSULTATION DATE: 06/25/2023 REFERRING MD: Volanda Napoleon CHIEF COMPLAINT: Severe headaches  HPI  73 y.o female with significant PMH of PAF, IBS, diverticulosis, DVT on Eliquis, HTN, IDA, HLD, OSA, anxiety and depression who presented to the ED with chief complaints of severe headache.  Per ED reports, patient apparently fell on Monday hitting the back of her head and may have lost consciousness per her brother.  Patient complained of posterior headaches for about 2 to 3 days but refused to seek care.  She went out to lunch today with friends and began complaining of severe headache.  EMS was called and patient transported to the ED.   ED Course: Initial vital signs showed HR of 60 beats/minute, BP 138/70 mm Hg, the RR 18 breaths/minute, and the oxygen saturation 100% on RA and a temperature of 97.72F (36.4C). Patient was noted with change in mental status while waiting in triage.  Per triage nurse, patient suddenly stopped responding and was drooling at the corner of her mouth with some stiffening of her hands and left sided gaze.  She was given 1 mg Ativan IV and immediately taken to a room where stat head CT obtained.    Pertinent Labs/Diagnostics Findings: Unremarkable CMP WBC: 6.4K/L, Hgb/Hct: 6.1/18.3 Plts: 143 CTH> large left subdural hematoma with mass effect on the left lateral ventricles   Given finding as above, neurosurgery was consulted and patient taken emergently to the OR for decompression.  Patient transferred to the ICU postprocedure and PCCM consulted.  Past Medical History  PAF, IBS, diverticulosis, DVT on Eliquis, HTN, IDA, HLD, OSA, anxiety and depression  Significant Hospital Events   2/14: Admitted with traumatic subdural hematoma s/p emergent crani for hematoma evacuation.  Extubated and transferred to the ICU.  PCCM consulted  Consults:  PCCM  Procedures:   2/14:Left-sided craniotomy for hematoma evacuation  Significant Diagnostic Tests:  2/14: Chest Xray> IMPRESSION: No active disease.  2/14: Noncontrast CT head> IMPRESSION: 1. Large left cerebral convexity subdural hematoma measuring up to 1.1 cm with a 1.2 cm rightward midline shift as well as early uncal herniation. 2. Mass effect on the left lateral ventricular system with asymmetric enlargement of the left temporal horn, which can be seen in the setting of early ventricular entrapment. 3. No acute cervical spine fracture. Interim History / Subjective:      Micro Data:  None  Antimicrobials:  None  OBJECTIVE  Blood pressure (!) 108/43, pulse 75, temperature 98.4 F (36.9 C), resp. rate 16, SpO2 96%.      Intake/Output Summary (Last 24 hours) at 06/25/2023 2305 Last data filed at 06/25/2023 2228 Gross per 24 hour  Intake 1715.4 ml  Output 620 ml  Net 1095.4 ml   There were no vitals filed for this visit.  Physical Examination  GENERAL: 73 year-old critically ill patient lying in the bed restless  EYES: PEERLA. No scleral icterus. Extraocular muscles intact.  HEENT: s/p crani, dressing intact, Left drain intact, normocephalic. Oropharynx and nasopharynx clear.  NECK:  No JVD, supple  LUNGS: Normal breath sounds bilaterally.  No use of accessory muscles of respiration.  CARDIOVASCULAR: S1, S2 normal. No murmurs, rubs, or gallops.  ABDOMEN: Soft, NTND EXTREMITIES: No swelling or erythema. Pulses palpable distally. NEUROLOGIC: The patient is confused. No focal neurological deficit appreciated. Cranial nerves are intact.  SKIN: No obvious rash, lesion, or ulcer. Warm to touch Labs/imaging that I  havepersonally reviewed  (right click and "Reselect all SmartList Selections" daily)     Labs   CBC: Recent Labs  Lab 06/25/23 1333  WBC 6.4  NEUTROABS 2.9  HGB 6.1*  HCT 18.3*  MCV 94.8  PLT 143*   Basic Metabolic Panel: Recent Labs  Lab 06/25/23 1451  06/25/23 2048  NA 138 136  K 4.1  --   CL 100  --   CO2 27  --   GLUCOSE 103*  --   BUN 10  --   CREATININE 0.75  --   CALCIUM 9.4  --    GFR: CrCl cannot be calculated (Unknown ideal weight.). Recent Labs  Lab 06/25/23 1333 06/25/23 1452  WBC 6.4  --   LATICACIDVEN 1.2 1.0    Liver Function Tests: Recent Labs  Lab 06/25/23 1451  AST 22  ALT 21  ALKPHOS 94  BILITOT 0.9  PROT 6.9  ALBUMIN 4.1   No results for input(s): "LIPASE", "AMYLASE" in the last 168 hours. No results for input(s): "AMMONIA" in the last 168 hours.  ABG    Component Value Date/Time   PHART 7.41 03/01/2022 0256   PCO2ART 38 03/01/2022 0256   PO2ART 71 (L) 03/01/2022 0256   HCO3 24.1 03/01/2022 0256   ACIDBASEDEF 0.3 03/01/2022 0256   O2SAT 98 03/01/2022 0256     Coagulation Profile: Recent Labs  Lab 06/25/23 1451  INR 1.4*   Cardiac Enzymes: No results for input(s): "CKTOTAL", "CKMB", "CKMBINDEX", "TROPONINI" in the last 168 hours.  HbA1C: No results found for: "HGBA1C"  CBG: No results for input(s): "GLUCAP" in the last 168 hours.  Review of Systems:   Unable to be obtained secondary to the patient's intubated and sedated status.   Past Medical History  She,  has a past medical history of Bradycardia, Chest pain, Constipation, Depression, Diverticulitis, Diverticulosis of colon, DOE (dyspnea on exertion), DVT (deep venous thrombosis) (HCC), Esophageal stricture, Hyperlipemia, Hypertension, IBS (irritable bowel syndrome), and Sleep apnea.   Surgical History    Past Surgical History:  Procedure Laterality Date   APPENDECTOMY     BLOOD CLOT  2004   RIGHT LEG   CHOLECYSTECTOMY     COLON SURGERY     GALLBLADDER SURGERY  2003   NISSEN FUNDOPLICATION     PLANTAR FASCIA SURGERY  2007   sigmoid colectomy  10/21/2010   pelvic anastomosis   VAGINAL HYSTERECTOMY     Social History   reports that she has never smoked. She has never used smokeless tobacco. She reports that she  does not drink alcohol and does not use drugs.   Family History   Her family history includes Brain cancer in her father; Breast cancer in her cousin; Colitis in her sister; Colon polyps in her sister; Crohn's disease in her sister; Diabetes in her sister; Heart disease in her mother; Irritable bowel syndrome in her brother. There is no history of Colon cancer.   Allergies Allergies  Allergen Reactions   Dicyclomine Other (See Comments)    REACTION: Choking   Lisinopril Other (See Comments)    REACTION: Choking   Losartan Shortness Of Breath   Ace Inhibitors Other (See Comments)    Unknown to patient   Penicillins Itching and Rash    Home Medications  Prior to Admission medications   Medication Sig Start Date End Date Taking? Authorizing Provider  albuterol (PROVENTIL HFA;VENTOLIN HFA) 108 (90 Base) MCG/ACT inhaler Inhale 2 puffs into the lungs every 6 (six) hours as  needed for wheezing or shortness of breath. 11/16/17   Alford Highland, MD  apixaban (ELIQUIS) 5 MG TABS tablet Take 1 tablet (5 mg total) by mouth 2 (two) times daily. 11/30/22 12/30/22  Phineas Semen, MD  aspirin 81 MG tablet Take 1 tablet (81 mg total) by mouth daily. 10/27/17   [facility], Roe Rutherford, PA  atorvastatin (LIPITOR) 20 MG tablet Take 20 mg by mouth daily.    [provider]  citalopram (CELEXA) 20 MG tablet Take 20 mg by mouth daily. 04/05/19   [provider]  diltiazem (CARDIZEM) 30 MG tablet Take 1 tablet (30 mg total) by mouth 2 (two) times daily as needed (sustained HR>120 with symptoms). 11/30/22 11/30/23  Shaune Pollack, MD  famotidine (PEPCID) 20 MG tablet Take 20 mg by mouth at bedtime.  03/06/19   [provider]  hydrALAZINE (APRESOLINE) 25 MG tablet Take 25 mg by mouth in the morning and at bedtime.    [provider]  isosorbide mononitrate (IMDUR) 30 MG 24 hr tablet Take 30 mg by mouth daily. 02/23/22   [provider]  loperamide (IMODIUM) 2 MG capsule  Take 2 capsules (4 mg total) by mouth every 8 (eight) hours as needed for diarrhea or loose stools. 08/09/20   Alford Highland, MD  LORazepam (ATIVAN) 0.5 MG tablet Take 0.5 mg by mouth every 8 (eight) hours as needed. For anxiety    [provider]  metoCLOPramide (REGLAN) 10 MG tablet Take 1 tablet (10 mg total) by mouth every 8 (eight) hours as needed for nausea. 01/06/23 02/05/23  Trinna Post, MD  metoprolol succinate (TOPROL-XL) 25 MG 24 hr tablet TAKE 1 TABLET BY MOUTH TWICE DAILY FOR 90 DAYS 02/15/18   [provider]  spironolactone (ALDACTONE) 25 MG tablet Take 1 tablet (25 mg total) by mouth daily. 04/20/19 02/28/22  Jodelle Gross, NP  Scheduled Meds: Continuous Infusions:  [START ON 06/26/2023] famotidine (PEPCID) IV     [START ON 06/26/2023] levETIRAcetam     niCARDipine 5 mg/hr (06/25/23 2329)   sodium chloride 3% (hypertonic)     PRN Meds:.docusate sodium, fentaNYL (SUBLIMAZE) injection, LORazepam, polyethylene glycol  Active Hospital Problem list   See systems below  Assessment & Plan:  #Traumatic Subdural Hematoma with Left -to Right midline shift S/p Left-sided craniotomy for evacuation of subdural hematoma POD#0 -Goal SBP < 160 using prn Labetolol or Nicardipine Gtt if needed -HOB >= 30 degrees with aspiration precautions -Frequent neuro checks per protocol. Obtain stat head CT and if acute change -Goal Na>145, INR goal <1.4, daily coags.  -Drains to suction per NSG -Pain control -No antiplatelet or anticoagulant agents at this time until cleared by Neurosurgery -Neurosurgery following  #Altered Mental Status #Seizure Activity likely in the setting for tSDH -Seizure precautions -Seizure prophylaxis with Keppra, received loading dose in the ED will continue 500mg  BID -Lorazepam/Versed PRN for breakthrough seizure -consider EEG and Neurology consult if with recurrent seizure post crani  #Acute Blood Loss Anemia due to tSDH Hx of IDA Hgb 6.1 on  presentation s/p 2units PRBCs -Maintain MAP >65 -Monitor for S/Sx of bleeding -Trend CBC (H&H q6h) -SCD's for VTE Prophylaxis (chemical ppx contraindicated) -Transfuse for Hgb <7  (has received 2 units PRBC's so far)   #Paroxysmal AF rate controlled #HTN #HLD Hx of LVH -Hold diltiazem, spironolactone, Imdur and metoprolol for now while on Nicardipine gtt -Hold Eliquis -Continue Atorvastatin and Lipitor once able to take po  #Anxiety and Depression -continue home Citalopram  and  Lorazepam o   Best practice:  Diet:  NPO Pain/Anxiety/Delirium protocol (if indicated): No VAP protocol (if indicated): Not indicated DVT prophylaxis: Contraindicated GI prophylaxis: H2B Glucose control:  SSI No Central venous access:  N/A Arterial line:  Yes, and it is still needed Foley:  Yes, and it is still needed Mobility:  bed rest  PT consulted: N/A Last date of multidisciplinary goals of care discussion []  Code Status:  full code Disposition: ICU   = Goals of Care = Code Status Order: full  Primary Emergency ContactRollene Fare, Home Phone: [phone] Wishes to pursue full aggressive treatment and intervention options, including CPR and intubation.  Critical care time: 45 minutes      Webb Silversmith DNP, CCRN, FNP-C, AGACNP-BC Acute Care & Family Nurse Practitioner [facility] PCCM on call pager [phone]

## 2023-06-25 NOTE — Progress Notes (Signed)
eLink Physician-Brief Progress Note Patient Name: [name] DOB: 1950-10-13 MRN: [id number]   Date of Service  06/25/2023  HPI/Events of Note  73 year old female with a history of deep vein thrombosis on chronic anticoagulation, esophageal strictures, IBS who initially presented to the emergency department with acute encephalopathy and severe headache worsening over the past 3 to 4 days.  She is found to have a subdural hematoma and underwent left-sided craniotomy for evacuation of hematoma.  Vital signs reviewed.  Currently on nicardipine propofol, and intermittent fentanyl.  Received Andexxa.  Laboratory studies reviewed. Evidence of normocytic anemia status post 2 units PRBC.  eICU Interventions  Frequent neurological checks, head of bed elevation, standard neuroprotective measures  Interval CT scan per neurosurgery.  Strict blood pressure control less than systolic 160 per neurosurgery.  Discontinue Toradol.  Avoid NSAIDs in setting of acute intracranial hemorrhage and coagulopathy  Avoid benzos in geriatric patients as they can contribute to delirium especially with concurrent intracranial abnormalities  DVT prophylaxis with SCDs, chemoprophylaxis contraindicated. GI prophylaxis indicated, start famotidine.      Intervention Category Evaluation Type: New Patient Evaluation  [name] 06/25/2023, 11:02 PM

## 2023-06-25 NOTE — ED Provider Notes (Signed)
[facility] Provider Note    Event Date/Time   First MD Initiated Contact with Patient 06/25/23 1404     (approximate)   History   Chief Complaint Headache   HPI  [name] is a 73 y.o. female with past medical history of hypertension, hyperlipidemia, and DVT on Eliquis who presents to the ED complaining of headache.  Per brother at bedside, patient initially had a fall 4 days ago that was unwitnessed, but brother believes that patient may have passed out.  She had hit the back of her head at that time, was complaining of posterior headache over the past couple of days but refused to seek care.  She apparently went out to lunch with friends today, began complaining of severe headache at that time and EMS was contacted.  She arrived screaming out in pain, but then became suddenly quiet in triage with some stiffening of her arms and gaze to the left side.  She was given 1 mg of IV Ativan and brought immediately to CT scan.     Physical Exam   Triage Vital Signs: ED Triage Vitals  Encounter Vitals Group     BP 06/25/23 1325 138/74     Systolic BP Percentile --      Diastolic BP Percentile --      Pulse Rate 06/25/23 1325 60     Resp 06/25/23 1325 18     Temp 06/25/23 1325 97.6 F (36.4 C)     Temp Source 06/25/23 1325 Oral     SpO2 06/25/23 1325 100 %     Weight --      Height --      Head Circumference --      Peak Flow --      Pain Score 06/25/23 1322 10     Pain Loc --      Pain Education --      Exclude from Growth Chart --     Most recent vital signs: Vitals:   06/25/23 1325  BP: 138/74  Pulse: 60  Resp: 18  Temp: 97.6 F (36.4 C)  SpO2: 100%    Constitutional: Somnolent but arousable to voice. Eyes: Conjunctivae are normal.  Pupils equal, round, and reactive to light bilaterally. Head: Atraumatic. Nose: No congestion/rhinnorhea. Mouth/Throat: Mucous membranes are moist.  Neck: No midline cervical spine tenderness to  palpation. Cardiovascular: Normal rate, regular rhythm. Grossly normal heart sounds.  2+ radial pulses bilaterally. Respiratory: Normal respiratory effort.  No retractions. Lungs CTAB. Gastrointestinal: Soft and nontender. No distention. Musculoskeletal: No lower extremity tenderness nor edema.  Neurologic: Moving all extremities equally but unable to follow commands, no verbal response noted, nods her head to respond to questioning.    ED Results / Procedures / Treatments   Labs (all labs ordered are listed, but only abnormal results are displayed) Labs Reviewed  CBC WITH DIFFERENTIAL/PLATELET - Abnormal; Notable for the following components:      Result Value   RBC 1.93 (*)    Hemoglobin 6.1 (*)    HCT 18.3 (*)    Platelets 143 (*)    All other components within normal limits  PROTIME-INR - Abnormal; Notable for the following components:   Prothrombin Time 17.4 (*)    INR 1.4 (*)    All other components within normal limits  LACTIC ACID, PLASMA  LACTIC ACID, PLASMA  URINALYSIS, ROUTINE W REFLEX MICROSCOPIC  SODIUM  SODIUM  COMPREHENSIVE METABOLIC PANEL  SODIUM  TYPE AND SCREEN  PREPARE RBC (CROSSMATCH)  TROPONIN I (HIGH SENSITIVITY)  TROPONIN I (HIGH SENSITIVITY)     EKG  ED ECG REPORT I, Chesley Noon, the attending physician, personally viewed and interpreted this ECG.   Date: 06/25/2023  EKG Time: 13:37  Rate: 52  Rhythm: sinus bradycardia  Axis: Normal  Intervals:none  ST&T Change: None  RADIOLOGY CT head reviewed and interpreted by me with large left-sided subdural hematoma and midline shift.  PROCEDURES:  Critical Care performed: Yes, see critical care procedure note(s)  .Critical Care  Performed by: Chesley Noon, MD Authorized by: Chesley Noon, MD   Critical care provider statement:    Critical care time (minutes):  30   Critical care time was exclusive of:  Separately billable procedures and treating other patients and teaching time    Critical care was necessary to treat or prevent imminent or life-threatening deterioration of the following conditions:  CNS failure or compromise   Critical care was time spent personally by me on the following activities:  Development of treatment plan with patient or surrogate, discussions with consultants, evaluation of patient's response to treatment, examination of patient, ordering and review of laboratory studies, ordering and review of radiographic studies, ordering and performing treatments and interventions, pulse oximetry, re-evaluation of patient's condition and review of old charts   I assumed direction of critical care for this patient from another provider in my specialty: no     Care discussed with: admitting provider      MEDICATIONS ORDERED IN ED: Medications  coag fact Xa recombinant (ANDEXXA) low dose infusion 900 mg (has no administration in time range)  sodium chloride 3% (hypertonic) IV bolus 250 mL (has no administration in time range)  docusate sodium (COLACE) capsule 100 mg (has no administration in time range)  polyethylene glycol (MIRALAX / GLYCOLAX) packet 17 g (has no administration in time range)  LORazepam (ATIVAN) injection 1 mg (1 mg Intravenous Given 06/25/23 1332)  levETIRAcetam (KEPPRA) IVPB 1000 mg/100 mL premix (0 mg Intravenous Stopped 06/25/23 1457)     IMPRESSION / MDM / ASSESSMENT AND PLAN / ED COURSE  I reviewed the triage vital signs and the nursing notes.                              73 y.o. female with past medical history of hypertension, hyperlipidemia, and DVT on Eliquis who presents to the ED following fall striking her head 4 days ago, suddenly began complaining of severe headache just prior to arrival.  Patient's presentation is most consistent with acute presentation with potential threat to life or bodily function.  Differential diagnosis includes, but is not limited to, subdural hematoma, subarachnoid hemorrhage, brain herniation,  anemia, electrolyte abnormality, AKI.  At the time of my assessment, patient had already received IV Ativan following evaluation in triage and underwent CT imaging.  She is somnolent but arousable to voice, moving all extremities spontaneously but having difficulty following commands and does not respond verbally.  CT head reviewed by me with large subdural hematoma and resulting midline shift.  It does seem that patient had a possible seizure episode in triage, will give IV Keppra in addition to the Ativan she has already received.  Also plan to reverse her Eliquis with Andexxa, which was discussed with pharmacy.  Dr. Myer Haff of neurosurgery was consulted, evaluated patient at the bedside and will plan for emergent evacuation in the operating room.  Consent obtained from patient's daughter over  the phone as well as brother at bedside.  Patient also noted to be significantly anemic with hemoglobin of 6.1, will transfuse 2 units of PRBCs.  Administration of hypertonic saline was delayed as initial CMP apparently with concerning electrolyte abnormalities, pharmacy unwilling to release hypertonic saline until patient's sodium level is confirmed.  Case discussed with ICU team for admission.      FINAL CLINICAL IMPRESSION(S) / ED DIAGNOSES   Final diagnoses:  Subdural hematoma (HCC)  Seizure (HCC)     Rx / DC Orders   ED Discharge Orders     None        Note:  This document was prepared using Dragon voice recognition software and may include unintentional dictation errors.   Chesley Noon, MD 06/25/23 1530

## 2023-06-25 NOTE — Anesthesia Procedure Notes (Signed)
Arterial Line Insertion Start/End2/14/2025 3:45 PM Performed by: Louie Boston, MD, Elisabeth Pigeon, CRNA, anesthesiologist  Patient location: Pre-op. Preanesthetic checklist: patient identified, IV checked, site marked, risks and benefits discussed, surgical consent, monitors and equipment checked, pre-op evaluation, timeout performed and anesthesia consent Lidocaine 1% used for infiltration Right, radial was placed Catheter size: 20 G Hand hygiene performed  and maximum sterile barriers used   Attempts: 1 Procedure performed without using ultrasound guided technique. Following insertion, dressing applied. Post procedure assessment: normal and unchanged

## 2023-06-25 NOTE — Telephone Encounter (Signed)
Patient taken to OR for emergent left-sided craniotomy for evacuation of subdural hematoma on 06/25/23 with Dr Myer Haff. Post op appointments have been scheduled.   Will need a CT in about 4 weeks (around 07/23/23) per note by Manning Charity, PA-C. CT has been ordered.

## 2023-06-25 NOTE — ED Provider Triage Note (Signed)
Emergency Medicine Provider Triage Evaluation Note  [name] , a 73 y.o. female  was evaluated in triage.  Pt complains of headache.  Apparently hit her head about 3 days ago.  Has continued headache, pain.  Screaming out in pain..  Review of Systems  Positive:  Negative:   Physical Exam  BP 138/74   Pulse 60   Temp 97.6 F (36.4 C) (Oral)   Resp 18   SpO2 100%  Gen:   Awake, no distress   Resp:  Normal effort  MSK:   Moves extremities without difficulty  Other:    Medical Decision Making  Medically screening exam initiated at 1:32 PM.  Appropriate orders placed.  [name] was informed that the remainder of the evaluation will be completed by another provider, this initial triage assessment does not replace that evaluation, and the importance of remaining in the ED until their evaluation is complete.  Patient here for headache with apparent head trauma several days ago no reported numbness or weakness interpreted by EMS.  Shortly after my initial evaluation patient stopped verbally responding she flexed her arms.  Jaw tightening suspected seizure-like activity immediately give Ativan will order CT and labs patient needs to be roomed immediately.   Christen Bame, [place] 06/25/23 1333

## 2023-06-25 NOTE — Anesthesia Preprocedure Evaluation (Signed)
Anesthesia Evaluation  Patient identified by MRN, date of birth, ID band Patient confused  General Assessment Comment:Patient presented to ED with AMS, HA after fall on Monday. Had seizure like episode in the ED, received ativan. Found to have subdural hematoma with substantial left-to-right midline shift due to brain compression on CT.  Patient unable to cooperate with history or exam.   Reviewed: Allergy & Precautions, H&P , Patient's Chart, lab work & pertinent test resultsPreop documentation limited or incomplete due to emergent nature of procedure.  History of Anesthesia Complications Negative for: history of anesthetic complications  Airway Mallampati: Unable to assess  TM Distance: >3 FB     Dental   Pulmonary sleep apnea    Pulmonary exam normal        Cardiovascular hypertension, On Medications + DOE  Normal cardiovascular exam+ dysrhythmias Atrial Fibrillation      Neuro/Psych  PSYCHIATRIC DISORDERS Anxiety Depression    negative neurological ROS     GI/Hepatic Neg liver ROS,GERD  Medicated,,  Endo/Other  negative endocrine ROS    Renal/GU      Musculoskeletal   Abdominal   Peds  Hematology negative hematology ROS (+)   Anesthesia Other Findings Past Medical History: No date: Bradycardia No date: Chest pain No date: Constipation No date: Depression No date: Diverticulitis No date: Diverticulosis of colon No date: DOE (dyspnea on exertion) No date: DVT (deep venous thrombosis) (HCC) No date: Esophageal stricture No date: Hyperlipemia No date: Hypertension No date: IBS (irritable bowel syndrome) No date: Sleep apnea  Past Surgical History: No date: APPENDECTOMY 2004: BLOOD CLOT     Comment:  RIGHT LEG No date: CHOLECYSTECTOMY No date: COLON SURGERY 2003: GALLBLADDER SURGERY No date: NISSEN FUNDOPLICATION 2007: PLANTAR FASCIA SURGERY 10/21/2010: sigmoid colectomy     Comment:  pelvic  anastomosis No date: VAGINAL HYSTERECTOMY     Reproductive/Obstetrics negative OB ROS                             Anesthesia Physical Anesthesia Plan  ASA: 4 and emergent  Anesthesia Plan: General ETT   Post-op Pain Management: Ofirmev IV (intra-op)*   Induction: Intravenous  PONV Risk Score and Plan: 3 and Ondansetron, Dexamethasone, Midazolam and Treatment may vary due to age or medical condition  Airway Management Planned: Oral ETT  Additional Equipment: Arterial line  Intra-op Plan:   Post-operative Plan: Possible Post-op intubation/ventilation  Informed Consent: I have reviewed the patients History and Physical, chart, labs and discussed the procedure including the risks, benefits and alternatives for the proposed anesthesia with the patient or authorized representative who has indicated his/her understanding and acceptance.     Dental Advisory Given  Plan Discussed with: Anesthesiologist, CRNA and Surgeon  Anesthesia Plan Comments: (Patient consented for risks of anesthesia including but not limited to:  - adverse reactions to medications - damage to eyes, teeth, lips or other oral mucosa - nerve damage due to positioning  - sore throat or hoarseness - Damage to heart, brain, nerves, lungs, other parts of body or loss of life  Patient voiced understanding and assent.)        Anesthesia Quick Evaluation

## 2023-06-25 NOTE — ED Triage Notes (Signed)
Arrives from [facility] via [employer]. C/O left sided head pain. Fell at home on Monday and hit left side of head, no pain at that time.  Takes Eliquis.  18g Left Forearm. 4mg  Zofran given PTA for c/ol nausea  182/71 67 98% RA 26

## 2023-06-25 NOTE — Anesthesia Procedure Notes (Signed)
Procedure Name: Intubation Date/Time: 06/25/2023 3:40 PM  Performed by: Elisabeth Pigeon, CRNAPre-anesthesia Checklist: Patient identified, Patient being monitored, Timeout performed, Emergency Drugs available and Suction available Patient Re-evaluated:Patient Re-evaluated prior to induction Oxygen Delivery Method: Circle system utilized Preoxygenation: Pre-oxygenation with 100% oxygen Induction Type: IV induction, Rapid sequence and Cricoid Pressure applied Laryngoscope Size: Mac, 3 and McGrath Grade View: Grade I Tube type: Oral Tube size: 7.0 mm Number of attempts: 1 Airway Equipment and Method: Stylet Placement Confirmation: ETT inserted through vocal cords under direct vision, positive ETCO2 and breath sounds checked- equal and bilateral Secured at: 21 cm Tube secured with: Tape Dental Injury: Teeth and Oropharynx as per pre-operative assessment

## 2023-06-25 NOTE — H&P (View-Only) (Signed)
Consult requested by:  Dr. Larinda Buttery  Consult requested for:  Subdural hematoma  Primary Physician:  [name], MD  History of Present Illness: 06/25/2023 [name] is here today with a chief complaint of altered mental status.  She presented with severe headache.  She had her head to either Monday or Tuesday and has been having worsening headache since that time.  She is screaming in pain in the emergency department and had an episode with suspected seizure-like activity.  She was given Ativan and then a CT was performed.  She is unable to participate in her history taking though her brother is present.  She is on Eliquis for history of deep venous thrombosis.  The emergency department has already initiated a reversal plan.  I have utilized the care everywhere function in epic to review the outside records available from external health systems.  Review of Systems:  A 10 point review of systems is negative, except for the pertinent positives and negatives detailed in the HPI.  Past Medical History: Past Medical History:  Diagnosis Date   Bradycardia    Chest pain    Constipation    Depression    Diverticulitis    Diverticulosis of colon    DOE (dyspnea on exertion)    DVT (deep venous thrombosis) (HCC)    Esophageal stricture    Hyperlipemia    Hypertension    IBS (irritable bowel syndrome)    Sleep apnea     Past Surgical History: Past Surgical History:  Procedure Laterality Date   APPENDECTOMY     BLOOD CLOT  2004   RIGHT LEG   CHOLECYSTECTOMY     COLON SURGERY     GALLBLADDER SURGERY  2003   NISSEN FUNDOPLICATION     PLANTAR FASCIA SURGERY  2007   sigmoid colectomy  10/21/2010   pelvic anastomosis   VAGINAL HYSTERECTOMY      Allergies: Allergies as of 06/25/2023 - Review Complete 06/25/2023  Allergen Reaction Noted   Dicyclomine Other (See Comments)    Lisinopril Other (See Comments)    Losartan Shortness Of Breath 11/16/2017   Ace  inhibitors Other (See Comments) 08/12/2010   Penicillins Itching and Rash     Medications: No outpatient medications have been marked as taking for the 06/25/23 encounter [facility] Encounter).    Social History: Social History   Tobacco Use   Smoking status: Never   Smokeless tobacco: Never  Vaping Use   Vaping status: Never Used  Substance Use Topics   Alcohol use: No   Drug use: No    Family Medical History: Family History  Problem Relation Age of Onset   Heart disease Mother    Crohn's disease Sister    Colon polyps Sister    Irritable bowel syndrome Brother    Brain cancer Father    Colitis Sister    Diabetes Sister    Breast cancer Cousin    Colon cancer Neg Hx     Physical Examination: Vitals:   06/25/23 1325  BP: 138/74  Pulse: 60  Resp: 18  Temp: 97.6 F (36.4 C)  SpO2: 100%    General: Patient is nonverbal and not participating in exam.  Neck:   Supple.    Respiratory: Patient is breathing without any difficulty.   NEUROLOGICAL:     Opens eyes to voice.  Briefly regards.  Does not follow commands.  She is nonverbal.  She names 0 out of 3.  She does not  repeat.  She has slight right facial droop.  She has equal and reactive pupillary response.  Extraocular movements appear intact.  She does not protrude tongue.  She does not participate in pronator drift.  She is moving her left side spontaneously and in goal-directed fashion.  She is able to localize on the left side.  She weakly withdraws her right upper extremity.  She withdraws her right lower extremity briskly.  Sensory examination is unreliable.  Reflex examination shows 1+ reflexes throughout.  Gait is untested.     Medical Decision Making  Imaging: CT Head 06/25/2023 IMPRESSION: 1. Large left cerebral convexity subdural hematoma measuring up to 1.1 cm with a 1.2 cm rightward midline shift as well as early uncal herniation. 2. Mass effect on the left lateral ventricular system  with asymmetric enlargement of the left temporal horn, which can be seen in the setting of early ventricular entrapment. 3. No acute cervical spine fracture.     Electronically Signed   By: Lorenza Cambridge M.D.   On: 06/25/2023 14:29  I have personally reviewed the images and agree with the above interpretation.  Assessment and Plan: [name] is a pleasant 73 y.o. female with subdural hematoma with substantial left-to-right midline shift due to brain compression.  She has altered mental status with a current GCS score of eyes 3, verbal 1, motor 5 = 9.  She has 1 cm of midline shift.  She is on Eliquis.  This is a very critical situation.  My colleague in the emergency department has appropriately initiated reversal of her anticoagulation and administration of hypertonic saline.  We will also give Keppra.  She will need to be admitted to the intensive care unit.  Without surgical evacuation, she is at prohibitive risk of terminal decline.  I have recommended surgical intervention with left-sided craniotomy for evacuation of subdural hematoma.  The patient is unable to give consent for this.  I discussed the plan in detail with the patient's daughter [name] as well as her brother.  Her daughter was able to discuss the situation with me  I discussed the planned procedure at length with the patient's daughter, including the risks, benefits, alternatives, and indications. The risks discussed include but are not limited to bleeding, infection, need for reoperation, spinal fluid leak, stroke, vision loss, anesthetic complication, coma, paralysis, and even death. I also described in detail that improvement was not guaranteed.  Ms. Ignacia Palma expressed understanding of these risks, and asked that we proceed with surgery. I described the surgery in layman's terms, and gave ample opportunity for questions, which were answered to the best of my ability.  We will proceed emergently to the  operating room as soon as her anticoagulation is reversed.  Additionally, she is anemic with a hemoglobin of 6.  We will prepare blood for administration during the operative procedure.   I have communicated my recommendations to the requesting physician and coordinated care to facilitate these recommendations.     Idali Lafever K. Myer Haff MD, [facility] Neurosurgery

## 2023-06-25 NOTE — Op Note (Signed)
Indications: The patient is a 73yo female who presented with a subdural hematoma.  Due to ongoing brain compression and symptoms, surgical intervention was recommended.   Findings: subdural hematoma  Preoperative Diagnosis: subdural hematoma Postoperative Diagnosis: same   EBL: 100 ml IVF: see AR Drains: one Disposition: Extubated and Stable to PACU Complications: none  A foley catheter was placed.   Preoperative Note:   Risks of surgery discussed include: infection, bleeding, stroke, coma, death, paralysis, CSF leak, nerve/spinal cord injury, numbness, tingling, weakness, vascular injury, need for further surgery, persistent symptoms, and the risks of anesthesia. The patient understood these risks and agreed to proceed.  NAME OF PROCEDURE:               1. Left Craniotomy for evacuation of hematoma   PROCEDURE:  Patient was brought to the operating room, intubated. The mayfield pins were applied.  The patient was then positioned for a left-sided craniotomy.    The incision was planned, then prepped and draped in standard fashion.  The incision was opened sharply, then the galea opened.    The temporalis muscle was divided, then the periosteal used to reflect the muscle.   A frontotemporoparietal craniotomy was then fashioned with the burr and craniotome.  The dura was identified, then opened sharply.  A subdural hematoma was identified.  The acute subdural was then removed using irrigation and suction.  After removal, the intradural space was inspected and hemostasis achieved.  A small area of bleeding was noted near the midline near a confluence of veins.  After hemostasis was achieved, we turned attention to closure.  The dura was approximated.  Tackup sutures were placed. The craniotomy site was checked and a fixation plate used to reconstruct the skull.  A drain was placed. The temporalis and galea were closed.  Staples were used on the skin. A sterile dressing was placed.     Needle, lap and all counts were correct at the end of the case.    I performed the procedure.   Venetia Night MD Neurosurgery

## 2023-06-25 NOTE — Consult Note (Signed)
Consult requested by:  Dr. Larinda Buttery  Consult requested for:  Subdural hematoma  Primary Physician:  Melissa Penton, MD  History of Present Illness: 06/25/2023 [name] is here today with a chief complaint of altered mental status.  She presented with severe headache.  She had her head to either Monday or Tuesday and has been having worsening headache since that time.  She is screaming in pain in the emergency department and had an episode with suspected seizure-like activity.  She was given Ativan and then a CT was performed.  She is unable to participate in her history taking though her brother is present.  She is on Eliquis for history of deep venous thrombosis.  The emergency department has already initiated a reversal plan.  I have utilized the care everywhere function in epic to review the outside records available from external health systems.  Review of Systems:  A 10 point review of systems is negative, except for the pertinent positives and negatives detailed in the HPI.  Past Medical History: Past Medical History:  Diagnosis Date   Bradycardia    Chest pain    Constipation    Depression    Diverticulitis    Diverticulosis of colon    DOE (dyspnea on exertion)    DVT (deep venous thrombosis) (HCC)    Esophageal stricture    Hyperlipemia    Hypertension    IBS (irritable bowel syndrome)    Sleep apnea     Past Surgical History: Past Surgical History:  Procedure Laterality Date   APPENDECTOMY     BLOOD CLOT  2004   RIGHT LEG   CHOLECYSTECTOMY     COLON SURGERY     GALLBLADDER SURGERY  2003   NISSEN FUNDOPLICATION     PLANTAR FASCIA SURGERY  2007   sigmoid colectomy  10/21/2010   pelvic anastomosis   VAGINAL HYSTERECTOMY      Allergies: Allergies as of 06/25/2023 - Review Complete 06/25/2023  Allergen Reaction Noted   Dicyclomine Other (See Comments)    Lisinopril Other (See Comments)    Losartan Shortness Of Breath 11/16/2017   Ace  inhibitors Other (See Comments) 08/12/2010   Penicillins Itching and Rash     Medications: No outpatient medications have been marked as taking for the 06/25/23 encounter [facility] Encounter).    Social History: Social History   Tobacco Use   Smoking status: Never   Smokeless tobacco: Never  Vaping Use   Vaping status: Never Used  Substance Use Topics   Alcohol use: No   Drug use: No    Family Medical History: Family History  Problem Relation Age of Onset   Heart disease Mother    Crohn's disease Sister    Colon polyps Sister    Irritable bowel syndrome Brother    Brain cancer Father    Colitis Sister    Diabetes Sister    Breast cancer Cousin    Colon cancer Neg Hx     Physical Examination: Vitals:   06/25/23 1325  BP: 138/74  Pulse: 60  Resp: 18  Temp: 97.6 F (36.4 C)  SpO2: 100%    General: Patient is nonverbal and not participating in exam.  Neck:   Supple.    Respiratory: Patient is breathing without any difficulty.   NEUROLOGICAL:     Opens eyes to voice.  Briefly regards.  Does not follow commands.  She is nonverbal.  She names 0 out of 3.  She does not  repeat.  She has slight right facial droop.  She has equal and reactive pupillary response.  Extraocular movements appear intact.  She does not protrude tongue.  She does not participate in pronator drift.  She is moving her left side spontaneously and in goal-directed fashion.  She is able to localize on the left side.  She weakly withdraws her right upper extremity.  She withdraws her right lower extremity briskly.  Sensory examination is unreliable.  Reflex examination shows 1+ reflexes throughout.  Gait is untested.     Medical Decision Making  Imaging: CT Head 06/25/2023 IMPRESSION: 1. Large left cerebral convexity subdural hematoma measuring up to 1.1 cm with a 1.2 cm rightward midline shift as well as early uncal herniation. 2. Mass effect on the left lateral ventricular system  with asymmetric enlargement of the left temporal horn, which can be seen in the setting of early ventricular entrapment. 3. No acute cervical spine fracture.     Electronically Signed   By: Lorenza Cambridge M.D.   On: 06/25/2023 14:29  I have personally reviewed the images and agree with the above interpretation.  Assessment and Plan: [name] is a pleasant 73 y.o. female with subdural hematoma with substantial left-to-right midline shift due to brain compression.  She has altered mental status with a current GCS score of eyes 3, verbal 1, motor 5 = 9.  She has 1 cm of midline shift.  She is on Eliquis.  This is a very critical situation.  My colleague in the emergency department has appropriately initiated reversal of her anticoagulation and administration of hypertonic saline.  We will also give Keppra.  She will need to be admitted to the intensive care unit.  Without surgical evacuation, she is at prohibitive risk of terminal decline.  I have recommended surgical intervention with left-sided craniotomy for evacuation of subdural hematoma.  The patient is unable to give consent for this.  I discussed the plan in detail with the patient's daughter [name] as well as her brother.  Her daughter was able to discuss the situation with me  I discussed the planned procedure at length with the patient's daughter, including the risks, benefits, alternatives, and indications. The risks discussed include but are not limited to bleeding, infection, need for reoperation, spinal fluid leak, stroke, vision loss, anesthetic complication, coma, paralysis, and even death. I also described in detail that improvement was not guaranteed.  [name] expressed understanding of these risks, and asked that we proceed with surgery. I described the surgery in layman's terms, and gave ample opportunity for questions, which were answered to the best of my ability.  We will proceed emergently to the  operating room as soon as her anticoagulation is reversed.  Additionally, she is anemic with a hemoglobin of 6.  We will prepare blood for administration during the operative procedure.   I have communicated my recommendations to the requesting physician and coordinated care to facilitate these recommendations.     [name] K. Myer Haff MD, [facility] Neurosurgery

## 2023-06-25 NOTE — ED Notes (Signed)
Pt not moving or responding and drooling at this time, respirations even and unlabored. Provider assessing pt in triage.

## 2023-06-25 NOTE — ED Triage Notes (Signed)
Pt c/o severe left sided headache that started today and nausea, pt denies history of headaches, denies vision changes. Pt AOX4, yelling out "oh god!"

## 2023-06-25 NOTE — Transfer of Care (Signed)
Immediate Anesthesia Transfer of Care Note  Patient: [name]  Procedure(s) Performed: CRANIOTOMY HEMATOMA EVACUATION SUBDURAL (Left)  Patient Location: PACU  Anesthesia Type:General  Level of Consciousness: sedated  Airway & Oxygen Therapy: Patient Spontanous Breathing and Patient connected to face mask oxygen  Post-op Assessment: Report given to RN and Post -op Vital signs reviewed and stable  Post vital signs: Reviewed and stable  Last Vitals:  Vitals Value Taken Time  BP 132/55 06/25/23 1833  Temp    Pulse 67 06/25/23 1836  Resp 19 06/25/23 1836  SpO2 100 % 06/25/23 1836  Vitals shown include unfiled device data.  Last Pain:  Vitals:   06/25/23 1325  TempSrc: Oral  PainSc:          Complications: No notable events documented.

## 2023-06-26 ENCOUNTER — Inpatient Hospital Stay: Payer: 59

## 2023-06-26 DIAGNOSIS — I480 Paroxysmal atrial fibrillation: Secondary | ICD-10-CM | POA: Diagnosis not present

## 2023-06-26 DIAGNOSIS — I10 Essential (primary) hypertension: Secondary | ICD-10-CM | POA: Diagnosis not present

## 2023-06-26 DIAGNOSIS — E785 Hyperlipidemia, unspecified: Secondary | ICD-10-CM | POA: Diagnosis not present

## 2023-06-26 DIAGNOSIS — S065XAA Traumatic subdural hemorrhage with loss of consciousness status unknown, initial encounter: Secondary | ICD-10-CM | POA: Diagnosis not present

## 2023-06-26 LAB — BASIC METABOLIC PANEL
Anion gap: 5 (ref 5–15)
BUN: 10 mg/dL (ref 8–23)
CO2: 26 mmol/L (ref 22–32)
Calcium: 8.6 mg/dL — ABNORMAL LOW (ref 8.9–10.3)
Chloride: 106 mmol/L (ref 98–111)
Creatinine, Ser: 0.67 mg/dL (ref 0.44–1.00)
GFR, Estimated: >60 mL/min (ref >60)
Glucose, Bld: 103 mg/dL — ABNORMAL HIGH (ref 70–99)
Potassium: 3.6 mmol/L (ref 3.5–5.1)
Sodium: 137 mmol/L (ref 135–145)

## 2023-06-26 LAB — TYPE AND SCREEN
ABO/RH(D): A POS
Antibody Screen: NEGATIVE
Unit division: 0
Unit division: 0

## 2023-06-26 LAB — SODIUM
Sodium: 137 mmol/L (ref 135–145)
Sodium: 138 mmol/L (ref 135–145)
Sodium: 137 mmol/L (ref 135–145)

## 2023-06-26 LAB — CBC
HCT: 35.3 % — ABNORMAL LOW (ref 36.0–46.0)
Hemoglobin: 12.3 g/dL (ref 12.0–15.0)
MCH: 30.8 pg (ref 26.0–34.0)
MCHC: 34.8 g/dL (ref 30.0–36.0)
MCV: 88.5 fL (ref 80.0–100.0)
Platelets: 180 10*3/uL (ref 150–400)
RBC: 3.99 MIL/uL (ref 3.87–5.11)
RDW: 14.8 % (ref 11.5–15.5)
WBC: 12 10*3/uL — ABNORMAL HIGH (ref 4.0–10.5)
nRBC: 0 % (ref 0.0–0.2)

## 2023-06-26 LAB — BPAM RBC
Blood Product Expiration Date: 202503162359
Blood Product Expiration Date: 202503162359
ISSUE DATE / TIME: 202502141602
ISSUE DATE / TIME: 202502141602
Unit Type and Rh: 6200
Unit Type and Rh: 6200

## 2023-06-26 LAB — MAGNESIUM: Magnesium: 1.7 mg/dL (ref 1.7–2.4)

## 2023-06-26 MED ORDER — ACETAMINOPHEN 10 MG/ML IV SOLN
1000.0000 mg | Freq: Four times a day (QID) | INTRAVENOUS | Status: AC
  Administered 2023-06-26 – 2023-06-27 (×4): 1000 mg via INTRAVENOUS
  Filled 2023-06-26 (×4): qty 100

## 2023-06-26 MED ORDER — MAGNESIUM SULFATE 2 GM/50ML IV SOLN
2.0000 g | Freq: Once | INTRAVENOUS | Status: AC
  Administered 2023-06-26: 2 g via INTRAVENOUS
  Filled 2023-06-26: qty 50

## 2023-06-26 MED ORDER — CHLORHEXIDINE GLUCONATE CLOTH 2 % EX PADS
6.0000 | MEDICATED_PAD | Freq: Every day | CUTANEOUS | Status: DC
  Administered 2023-06-26 – 2023-07-02 (×7): 6 via TOPICAL

## 2023-06-26 MED ORDER — LABETALOL HCL 5 MG/ML IV SOLN
10.0000 mg | INTRAVENOUS | Status: DC | PRN
Start: 2023-06-26 — End: 2023-07-12
  Administered 2023-06-29 – 2023-07-01 (×5): 10 mg via INTRAVENOUS
  Filled 2023-06-26 (×6): qty 4

## 2023-06-26 MED ORDER — FENTANYL CITRATE PF 50 MCG/ML IJ SOSY
25.0000 ug | PREFILLED_SYRINGE | INTRAMUSCULAR | Status: DC | PRN
  Administered 2023-06-26 – 2023-06-27 (×3): 25 ug via INTRAVENOUS
  Filled 2023-06-26 (×3): qty 1

## 2023-06-26 NOTE — Progress Notes (Signed)
Shifting back and forth in bed, calling for Momma over and over. Will not answer any questions except to say yes.

## 2023-06-26 NOTE — Anesthesia Postprocedure Evaluation (Signed)
Anesthesia Post Note  Patient: [name]  Procedure(s) Performed: CRANIOTOMY HEMATOMA EVACUATION SUBDURAL (Left)  Patient location during evaluation: SICU Anesthesia Type: General Level of consciousness: awake and confused Pain management: pain level controlled Vital Signs Assessment: post-procedure vital signs reviewed and stable Respiratory status: spontaneous breathing Cardiovascular status: stable Postop Assessment: no apparent nausea or vomiting Anesthetic complications: no Comments: Patient A&Ox1. Patient mumbles her name but does not answer any other questions.   No notable events documented.   Last Vitals:  Vitals:   06/26/23 0530 06/26/23 0600  BP:    Pulse: 81 73  Resp: (!) 22 17  Temp: 36.5 C 37.3 C  SpO2: 98% 98%    Last Pain:  Vitals:   06/26/23 0500  TempSrc:   PainSc: 6                  Stephanie Coup

## 2023-06-26 NOTE — Progress Notes (Addendum)
   Neurosurgery Progress Note  History: [name] is here for subdural hematoma s/p craniotomy for evacuation on 06/25/2023.  POD1: She is agitated but has improved compared to pre-op.  Physical Exam: Vitals:   06/26/23 0530 06/26/23 0600  BP:    Pulse: 81 73  Resp: (!) 22 17  Temp: 97.7 F (36.5 C) 99.1 F (37.3 C)  SpO2: 98% 98%   Speech is sparse - says "Yes" but is not saying much else.  Does answer questions with nodding.   AA Ox self, hospital to choices CNI with slight R facial droop  Strength:5/5 throughout left upper and lower extremity. RLE withdraws quickly, at least 4/5 RUE 3/5   Data:  Other tests/results: HCT pending  Assessment/Plan:  [name] has shown some improvement after subdural hematoma evacuation.  - mobilize - pain control - minimize narcotics - DVT prophylaxis - will review status after CT - PTOT - ok to start - Continue drain for now - Continue keppra - Consider MRI brain if patient does not continue to improve  Venetia Night MD, [facility] Department of Neurosurgery

## 2023-06-26 NOTE — Consult Note (Signed)
PHARMACY CONSULT NOTE - FOLLOW UP  Pharmacy Consult for Electrolyte Monitoring and Replacement   Recent Labs: Potassium (mmol/L)  Date Value  06/26/2023 3.6  11/22/2013 3.3 (L)   Magnesium (mg/dL)  Date Value  41/32/4401 1.7  11/22/2013 1.7 (L)   Calcium (mg/dL)  Date Value  02/72/5366 8.6 (L)   Calcium, Total (mg/dL)  Date Value  44/07/4740 8.7   Albumin (g/dL)  Date Value  59/56/3875 4.1  11/21/2013 3.8   Sodium (mmol/L)  Date Value  06/26/2023 137  02/27/2020 128 (L)  11/22/2013 138     Assessment: 72yo female who presented with a subdural hematoma.  Due to ongoing brain compression and symptoms, surgical intervention was recommended. S/p procedure 2/15.   Goal of Therapy:  WNL  Plan:  Mg 2 g IV x 1 F/u with AM labs.   Ronnald Ramp ,PharmD Clinical Pharmacist 06/26/2023 7:00 AM

## 2023-06-26 NOTE — Progress Notes (Signed)
NAME:  [name], MRN:  [id number], DOB:  [birth year], LOS: 1 ADMISSION DATE:  06/25/2023,  History of Present Illness:  73 y.o female with significant PMH of PAF, IBS, diverticulosis, DVT on Eliquis, HTN, IDA, HLD, OSA, anxiety and depression who presented to the ED with chief complaints of severe headache.   Per ED reports, patient apparently fell on Monday hitting the back of her head and may have lost consciousness per her brother.  Patient complained of posterior headaches for about 2 to 3 days but refused to seek care.  She went out to lunch today with friends and began complaining of severe headache.  EMS was called and patient transported to the ED.   ED Course: Initial vital signs showed HR of 60 beats/minute, BP 138/70 mm Hg, the RR 18 breaths/minute, and the oxygen saturation 100% on RA and a temperature of 97.66F (36.4C). Patient was noted with change in mental status while waiting in triage.  Per triage nurse, patient suddenly stopped responding and was drooling at the corner of her mouth with some stiffening of her hands and left sided gaze.  She was given 1 mg Ativan IV and immediately taken to a room where stat head CT obtained.     Pertinent Labs/Diagnostics Findings: Unremarkable CMP WBC: 6.4K/L, Hgb/Hct: 6.1/18.3 Plts: 143 CTH> large left subdural hematoma with mass effect on the left lateral ventricles    Given finding as above, neurosurgery was consulted and patient taken emergently to the OR for decompression.  Patient transferred to the ICU postprocedure and PCCM consulted.  Pertinent  Medical History  PAF, IBS, diverticulosis, DVT on Eliquis, HTN, IDA, HLD, OSA, anxiety and depression   Significant Hospital Events: Including procedures, antibiotic start and stop dates in addition to other pertinent events   2/14: Admitted with traumatic subdural hematoma s/p emergent crani for hematoma evacuation.  Extubated and transferred to the ICU.  PCCM consulted  Interim  History / Subjective:  More awake this a.m.  Able to recognize her brother at bedside.  Speech remains not well coherent.  She is moving all extremities except her right arm.  Objective   Blood pressure (!) 108/43, pulse 73, temperature 99.1 F (37.3 C), resp. rate 17, weight 70.3 kg, SpO2 98%.        Intake/Output Summary (Last 24 hours) at 06/26/2023 0841 Last data filed at 06/26/2023 4098 Gross per 24 hour  Intake 2030.73 ml  Output 1445 ml  Net 585.73 ml   Filed Weights   06/26/23 0600  Weight: 70.3 kg    Examination: General: Lethargic, awake and alert HENT: Supple neck reactive pupils Lungs: Clear bilateral air entry Cardiovascular: Normal S1, normal S2, regular rate and rhythm Abdomen: Soft nontender nondistended positive bowel sounds Extremities: Warm well-perfused no edema Neuro: Able to move all extremities except for right arm.  Pupils are reactive.  Alert and oriented.  Labs and imaging were reviewed.  Assessment & Plan:  Case of a 73 year old female patient with a past medical history of paroxysmal A-fib on Eliquis presenting for altered mental status and fall.  She was found to have a large left cerebral convexity subdural hematoma with a 1.2 cm rightward midline shift as well as early uncal herniation.  She is status post craniotomy and left subdural hematoma evacuation on 06/25/2023.  # Fall complicated by large left subdural hematoma with rightward midline shift and early uncal herniation status post craniotomy and left subdural hematoma evacuation on 06/25/2023.  Eliquis reversed with andnexa  on 06/25/2023. # Paroxysmal A-fib holding Eliquis # Hypertension # Hyperlipidemia  Neuro: Pain management, Tylenol 1000mg  Q6H sched, Fentanyl 25 mcg Q3H PRN for severe pain. CT scan of the head. Keppra for Seizure prophylaxis. PT/OT. Will assess recovery today and speech and if unable to take PO --> NG tube placement.  CVS: Normotensive on nicardipine drip. Once enteral  access achieved we will start po medications.  Lungs: No issues  GI: Famotidine for ulcer prophylaxis. NPO for now. Speech eval.  Renal: Monitor urine output.  Heme: SCDs for DVT prophylaxis.   Best Practice (right click and "Reselect all SmartList Selections" daily)   Diet/type: NPO DVT prophylaxis SCD Pressure ulcer(s): N/A GI prophylaxis: H2B Lines: N/A Foley:  N/A Code Status:  full code  Last date of multidisciplinary goals of care discussion [06/26/2023]  Critical care time: 40 minutes    Janann Colonel, MD [facility] 06/26/2023 9:11 AM

## 2023-06-26 NOTE — Plan of Care (Signed)

## 2023-06-26 NOTE — Evaluation (Signed)
Clinical/Bedside Swallow Evaluation Patient Details  Name: [name] MRN: [id number] Date of Birth: 04-27-1951  Today's Date: 06/26/2023 Time: SLP Start Time (ACUTE ONLY): 1430 SLP Stop Time (ACUTE ONLY): 1515 SLP Time Calculation (min) (ACUTE ONLY): 45 min  Past Medical History:  Past Medical History:  Diagnosis Date   Bradycardia    Chest pain    Constipation    Depression    Diverticulitis    Diverticulosis of colon    DOE (dyspnea on exertion)    DVT (deep venous thrombosis) (HCC)    Esophageal stricture    Hyperlipemia    Hypertension    IBS (irritable bowel syndrome)    Sleep apnea    Past Surgical History:  Past Surgical History:  Procedure Laterality Date   APPENDECTOMY     BLOOD CLOT  2004   RIGHT LEG   CHOLECYSTECTOMY     COLON SURGERY     GALLBLADDER SURGERY  2003   NISSEN FUNDOPLICATION     PLANTAR FASCIA SURGERY  2007   sigmoid colectomy  10/21/2010   pelvic anastomosis   VAGINAL HYSTERECTOMY     HPI:  73 y.o female with significant PMH of PAF, IBS, diverticulosis, DVT on Eliquis, HTN, IDA, HLD, OSA, anxiety and depression who presented to the ED with chief complaints of severe headache. Per ED reports, patient apparently fell on Monday hitting the back of her head and may have lost consciousness per her brother. Patient complained of posterior headaches for about 2 to 3 days but refused to seek care. She went out to lunch today with friends and began complaining of severe headache. EMS was called and patient transported to the ED. Admitted 2/14 with traumatic subdural hematoma s/p emergent crani for hematoma evacuation. Extubated and transferred to the ICU. Pt now on 2L O2 and NPO. CT Head 2/15: Resolved midline shift after subdural hematoma evacuation. No complicating features. CXR 2/14: No active disease. GI History: Esophageal stricture, per chart review last EGD with dilation completed on 01/25/23.    Assessment / Plan / Recommendation  Clinical  Impression  Pt seen for bedside swallow eval following emergent crani for hematoma evacuation. Pt lethargic, though will open eyes and follow contextual commands with extended time and tactile/visual cues. Limited verbalizations, predominately on "yeah" and "OK" across questioning. Pt on 2L O2 with O2 saturations maintained at greater than 97 for duration of session. Oral care completed; pt is edentulous (brother in room reported that dentures can be retrieved from pt's home if needed). No focal oral motor weakness noted, though limited command following for complete assessment. Intermittent tactile cues provided for sustained alertness for duration of PO trials. Trials completed of ice chips, thin liquid (via cup and straw), and puree. No overt or subtle s/sx pharyngeal dysphagia noted. No change to vocal quality across trials. Vitals stable for duration of trials. Oral phase grossly intact with intermittent verbal cues for alertness and visual inspection to ensure oral clearance. Therapist provided hand over hand assist for holding cup with left hand to increase engagement and awareness of task at hand. Total feeding assist provided for solids. Mit replaced at completion of session.       Belching noted intermittently following solids trials. Pt with known hx of esophageal complications. Per chart review last EGD completed on 01/25/23. Family endorsed challenges with esophageal clearance and lower GI issues (IBS/constipation).       Based on recent neuro injury, history of esophageal dysphagia and deconditioning, pt is at increased  risk of aspiration/postprandial aspiration. Recommend STRICT aspiration precautions, including slow rate, small bites, and elevated HOB during/after intake. Assist pt with holding cup for increased engagement. Recommend initiation of thin liquids and purees solids. Medications crushed in puree. Monitor for sustained alertness, suspect that endurance/alertness will negatively impact  nutrition. Pt's brother present for assessment and education provided for results and recommendations. RN and NP aware of recommendations. SLP will continue to follow for dysphagia management and cognitive linguistic eval when pt is ready.  SLP Visit Diagnosis: Dysphagia, oropharyngeal phase (R13.12);Dysphagia, pharyngoesophageal phase (R13.14);Other (comment) (impacted by acute lethargy)    Aspiration Risk  Moderate aspiration risk    Diet Recommendation   Dysphagia 1 (puree);Thin  Medication Administration: Crushed with puree    Other  Recommendations Recommended Consults: Consider GI evaluation (if indicated based on GI hx) Oral Care Recommendations: Oral care BID;Oral care before and after PO    Recommendations for follow up therapy are one component of a multi-disciplinary discharge planning process, led by the attending physician.  Recommendations may be updated based on patient status, additional functional criteria and insurance authorization.  Follow up Recommendations Follow physician's recommendations for discharge plan and follow up therapies      Assistance Recommended at Discharge    Functional Status Assessment Patient has had a recent decline in their functional status and demonstrates the ability to make significant improvements in function in a reasonable and predictable amount of time.  Frequency and Duration min 2x/week  2 weeks       Prognosis Prognosis for improved oropharyngeal function: Good Barriers to Reach Goals: Cognitive deficits      Swallow Study   General Date of Onset: 06/26/23 HPI: 73 y.o female with significant PMH of PAF, IBS, diverticulosis, DVT on Eliquis, HTN, IDA, HLD, OSA, anxiety and depression who presented to the ED with chief complaints of severe headache. Per ED reports, patient apparently fell on Monday hitting the back of her head and may have lost consciousness per her brother. Patient complained of posterior headaches for about 2 to  3 days but refused to seek care. She went out to lunch today with friends and began complaining of severe headache. EMS was called and patient transported to the ED. Admitted 2/14 with traumatic subdural hematoma s/p emergent crani for hematoma evacuation. Extubated and transferred to the ICU. Pt now on 2L O2 and NPO. CT Head 2/15: Resolved midline shift after subdural hematoma evacuation. No complicating features. CXR 2/14: No active disease. GI History: Esophageal stricture, per chart review last EGD with dilation completed on 01/25/23. Type of Study: Bedside Swallow Evaluation Previous Swallow Assessment: none in chart Diet Prior to this Study: NPO Temperature Spikes Noted: No (Temp 99.3; WBC 12.0) Respiratory Status: Nasal cannula (2L) History of Recent Intubation: Yes Total duration of intubation (days): 0 days (for procedure only) Date extubated: 06/25/23 Behavior/Cognition: Lethargic/Drowsy;Distractible;Requires cueing Oral Cavity Assessment: Within Functional Limits Oral Care Completed by SLP: Yes Oral Cavity - Dentition: Edentulous (dentures not present in room) Vision:  (not assessed) Self-Feeding Abilities: Needs assist (hand over hand for cup- total for use of spoon) Patient Positioning: Upright in bed Baseline Vocal Quality: Low vocal intensity Volitional Cough: Cognitively unable to elicit Volitional Swallow: Unable to elicit    Oral/Motor/Sensory Function Overall Oral Motor/Sensory Function: Within functional limits   Ice Chips Ice chips: Within functional limits   Thin Liquid Thin Liquid: Within functional limits Presentation: Straw;Cup    Nectar Thick Nectar Thick Liquid: Not tested   Honey Thick  Honey Thick Liquid: Not tested   Puree Puree: Impaired Presentation: Spoon Oral Phase Functional Implications: Prolonged oral transit Pharyngeal Phase Impairments:  (none) Other Comments: provided extra time   Solid     Solid: Not tested Other Comments: deferred secondary  to lethargy and dentition     [place] Danine Hor Clapp, MS, CCC-SLP Speech Language Pathologist Rehab Services; [facility] [place] [phone] (ascom)   [name] 06/26/2023,3:37 PM

## 2023-06-26 NOTE — Progress Notes (Signed)
PT Cancellation Note  Patient Details Name: [name] MRN: [id number] DOB: Aug 26, 1950   Cancelled Treatment:    Reason Eval/Treat Not Completed: Patient not medically ready. RN states patient is not really ready for PT. Will check on tomorrow.    Jaielle Dlouhy 06/26/2023, 4:04 PM

## 2023-06-27 ENCOUNTER — Inpatient Hospital Stay: Payer: 59

## 2023-06-27 DIAGNOSIS — E785 Hyperlipidemia, unspecified: Secondary | ICD-10-CM | POA: Diagnosis not present

## 2023-06-27 DIAGNOSIS — G935 Compression of brain: Secondary | ICD-10-CM | POA: Diagnosis not present

## 2023-06-27 DIAGNOSIS — R4701 Aphasia: Secondary | ICD-10-CM

## 2023-06-27 DIAGNOSIS — R569 Unspecified convulsions: Secondary | ICD-10-CM | POA: Diagnosis not present

## 2023-06-27 DIAGNOSIS — S065XAA Traumatic subdural hemorrhage with loss of consciousness status unknown, initial encounter: Secondary | ICD-10-CM | POA: Diagnosis not present

## 2023-06-27 DIAGNOSIS — I480 Paroxysmal atrial fibrillation: Secondary | ICD-10-CM | POA: Diagnosis not present

## 2023-06-27 DIAGNOSIS — R531 Weakness: Secondary | ICD-10-CM

## 2023-06-27 DIAGNOSIS — I10 Essential (primary) hypertension: Secondary | ICD-10-CM | POA: Diagnosis not present

## 2023-06-27 LAB — CBC
HCT: 36.4 % (ref 36.0–46.0)
Hemoglobin: 12.4 g/dL (ref 12.0–15.0)
MCH: 30.5 pg (ref 26.0–34.0)
MCHC: 34.1 g/dL (ref 30.0–36.0)
MCV: 89.7 fL (ref 80.0–100.0)
Platelets: 172 10*3/uL (ref 150–400)
RBC: 4.06 MIL/uL (ref 3.87–5.11)
RDW: 14.6 % (ref 11.5–15.5)
WBC: 11.7 10*3/uL — ABNORMAL HIGH (ref 4.0–10.5)
nRBC: 0 % (ref 0.0–0.2)

## 2023-06-27 LAB — BASIC METABOLIC PANEL
Anion gap: 12 (ref 5–15)
BUN: 11 mg/dL (ref 8–23)
CO2: 22 mmol/L (ref 22–32)
Calcium: 8.5 mg/dL — ABNORMAL LOW (ref 8.9–10.3)
Chloride: 102 mmol/L (ref 98–111)
Creatinine, Ser: 0.63 mg/dL (ref 0.44–1.00)
GFR, Estimated: >60 mL/min (ref >60)
Glucose, Bld: 87 mg/dL (ref 70–99)
Potassium: 3.4 mmol/L — ABNORMAL LOW (ref 3.5–5.1)
Sodium: 136 mmol/L (ref 135–145)

## 2023-06-27 LAB — SODIUM
Sodium: 135 mmol/L (ref 135–145)
Sodium: 134 mmol/L — ABNORMAL LOW (ref 135–145)
Sodium: 135 mmol/L (ref 135–145)

## 2023-06-27 LAB — MAGNESIUM: Magnesium: 2 mg/dL (ref 1.7–2.4)

## 2023-06-27 LAB — URINALYSIS, ROUTINE W REFLEX MICROSCOPIC
Bacteria, UA: NONE SEEN
Bilirubin Urine: NEGATIVE
Glucose, UA: NEGATIVE mg/dL
Ketones, ur: 20 mg/dL — AB
Leukocytes,Ua: NEGATIVE
Nitrite: NEGATIVE
Protein, ur: NEGATIVE mg/dL
RBC / HPF: >50 RBC/hpf (ref 0–5)
Specific Gravity, Urine: 1.017 (ref 1.005–1.030)
Squamous Epithelial / HPF: 0 /[HPF] (ref 0–5)
pH: 5 (ref 5.0–8.0)

## 2023-06-27 MED ORDER — HYDROMORPHONE HCL 1 MG/ML IJ SOLN
0.5000 mg | INTRAMUSCULAR | Status: AC
  Administered 2023-06-27: 0.5 mg via INTRAVENOUS
  Filled 2023-06-27: qty 1

## 2023-06-27 MED ORDER — HEPARIN SODIUM (PORCINE) 5000 UNIT/ML IJ SOLN
5000.0000 [IU] | Freq: Two times a day (BID) | INTRAMUSCULAR | Status: AC
  Administered 2023-06-27 – 2023-07-08 (×23): 5000 [IU] via SUBCUTANEOUS
  Filled 2023-06-27 (×23): qty 1

## 2023-06-27 MED ORDER — POTASSIUM CHLORIDE 10 MEQ/100ML IV SOLN
10.0000 meq | INTRAVENOUS | Status: AC
  Administered 2023-06-27 (×4): 10 meq via INTRAVENOUS
  Filled 2023-06-27 (×4): qty 100

## 2023-06-27 MED ORDER — FENTANYL CITRATE PF 50 MCG/ML IJ SOSY
50.0000 ug | PREFILLED_SYRINGE | Freq: Once | INTRAMUSCULAR | Status: AC
  Administered 2023-06-27: 50 ug via INTRAVENOUS

## 2023-06-27 MED ORDER — LORAZEPAM 2 MG/ML IJ SOLN: 0.5000 mg | INTRAMUSCULAR | Status: DC | PRN

## 2023-06-27 MED ORDER — LORAZEPAM 2 MG/ML IJ SOLN
0.5000 mg | Freq: Once | INTRAMUSCULAR | Status: AC
  Administered 2023-06-27: 0.5 mg via INTRAVENOUS
  Filled 2023-06-27: qty 1

## 2023-06-27 MED ORDER — HYDROMORPHONE HCL 1 MG/ML IJ SOLN
INTRAMUSCULAR | Status: AC
  Filled 2023-06-27: qty 1

## 2023-06-27 MED ORDER — FENTANYL CITRATE PF 50 MCG/ML IJ SOSY: 25.0000 ug | PREFILLED_SYRINGE | INTRAMUSCULAR | Status: DC | PRN

## 2023-06-27 MED ORDER — MAGNESIUM SULFATE 2 GM/50ML IV SOLN
2.0000 g | Freq: Once | INTRAVENOUS | Status: AC
  Administered 2023-06-27: 2 g via INTRAVENOUS
  Filled 2023-06-27: qty 50

## 2023-06-27 MED ORDER — HYDROMORPHONE HCL 1 MG/ML IJ SOLN
0.5000 mg | Freq: Once | INTRAMUSCULAR | Status: AC
  Administered 2023-06-27: 0.5 mg via INTRAVENOUS

## 2023-06-27 MED ORDER — LORAZEPAM 2 MG/ML IJ SOLN: 0.5000 mg | Freq: Once | INTRAMUSCULAR | Status: DC

## 2023-06-27 MED ORDER — GADOBUTROL 1 MMOL/ML IV SOLN
7.0000 mL | Freq: Once | INTRAVENOUS | Status: AC | PRN
Start: 2023-06-27 — End: 2023-06-27
  Administered 2023-06-27: 7 mL via INTRAVENOUS

## 2023-06-27 MED ORDER — HYDROMORPHONE HCL 1 MG/ML IJ SOLN
0.5000 mg | Freq: Once | INTRAMUSCULAR | Status: AC
  Administered 2023-06-27: 0.5 mg via INTRAVENOUS
  Filled 2023-06-27: qty 1

## 2023-06-27 MED ORDER — FENTANYL CITRATE PF 50 MCG/ML IJ SOSY
25.0000 ug | PREFILLED_SYRINGE | INTRAMUSCULAR | Status: DC | PRN
  Administered 2023-06-27 – 2023-06-29 (×7): 25 ug via INTRAVENOUS
  Filled 2023-06-27 (×8): qty 1

## 2023-06-27 MED ORDER — LORAZEPAM 2 MG/ML IJ SOLN
0.5000 mg | Freq: Three times a day (TID) | INTRAMUSCULAR | Status: DC | PRN
  Administered 2023-06-27 – 2023-07-06 (×10): 0.5 mg via INTRAVENOUS
  Filled 2023-06-27 (×10): qty 1

## 2023-06-27 MED ORDER — FENTANYL CITRATE PF 50 MCG/ML IJ SOSY
PREFILLED_SYRINGE | INTRAMUSCULAR | Status: AC
  Filled 2023-06-27: qty 1

## 2023-06-27 NOTE — Progress Notes (Signed)
Speech Language Pathology Treatment: Dysphagia  Patient Details Name: [name] MRN: [id number] DOB: [birth year] Today's Date: 06/27/2023 Time: 3244-0102 SLP Time Calculation (min) (ACUTE ONLY): 45 min  Assessment / Plan / Recommendation Clinical Impression  Pt seen for ongoing assessment of swallowing. Per last night's/morning's chart notes by NSG staff, pt was made NPO Sat night after inc'd crying/agitation; concern for mental status change. Pt was alert/awake and talkative this session; required cues for follow through w/ tasks. Noted slight decrease in R labial tone. Lingual ROM/strength WFL. RUE weak; possible decrease in R side awareness(?). Family present. Pt was admitted for subdural hematoma s/p craniotomy for evacuation on 06/25/2023. Per MD note:  POD2: She had agitation overnight. Continues to have difficulty with speech. POD1: She is agitated but has improved compared to pre-op.  At this session this morning, she is alert, verbally responsive and engaged in conversation w/ SLP. Distracted w/ some confusion but could follow through w/ tasks w/ cues and guidance. Dentures are at hospital now but NOT placed for po trials this morning in setting of recent change in status; NPO status by MD.  Pt and Family explained general aspiration precautions and agreed verbally to the need for following them especially sitting upright for all oral intake -- supported behind the back more for full upright sitting. Also instructed on Small, single sips/bites. Pt assisted w/ feeding self by holding Cup to drink via cup/straw; this SLP assisted w/ feeding foods. Pt consumed all po trials w/ NO overt, clinical s/s of aspiration noted w/ any consistency; respiratory status remained calm and unlabored, vocal quality clear b/t trials, no cough. O2 sats remained in upper 90s during. Oral phase appeared [facility] for bolus management and timely A-P transfer for swallowing; oral clearing achieved w/ all consistencies.  Purees and thin and nectar liquids, ice chips given this session.   Pt appears at reduced risk for aspiration when following general aspiration precautions, given feeding support, and using a modified diet at this time.  Recommend a Pureed diet(dys 1) w/ gravies added to moisten foods; Thin liquids. Recommend general aspiration precautions; Pills CRUSHED vs Whole in Puree; tray setup and positioning assistance for meals. Feeding support at meals.  ST services will continue to monitor toleration of diet and trials to upgrade next 1-2 days. Encouraged Family and pt to practice wearing the Dentures when not eating meals; also to sit on pt's R side to increase stimulation to that side. NSG updated. Precautions posted at bedside, chart.      HPI HPI: 73 y.o female with significant PMH of PAF, IBS, diverticulosis, DVT on Eliquis, HTN, IDA, HLD, OSA, anxiety and depression who presented to the ED with chief complaints of severe headache. Per ED reports, patient apparently fell on Monday hitting the back of her head and may have lost consciousness per her brother. Patient complained of posterior headaches for about 2 to 3 days but refused to seek care. She went out to lunch today with friends and began complaining of severe headache. EMS was called and patient transported to the ED. Admitted 2/14 with traumatic subdural hematoma s/p emergent crani for hematoma evacuation. Extubated and transferred to the ICU. Pt now on 2L O2 and NPO. CT Head 2/15: Resolved midline shift after subdural hematoma evacuation. No complicating features. CXR 2/14: No active disease. GI History: Esophageal stricture, per chart review last EGD with dilation completed on 01/25/23.  This Admit: pt tx'd for subdural hematoma s/p craniotomy for evacuation on 06/25/2023.  POD2: She had agitation overnight.  Continues to have difficulty with speech.  POD1: She is agitated but has improved compared to pre-op.  Repeat Head CT on 06/27/23: No  significant reaccumulation of the recently evaluated  subdural hematoma on the left, on a few coronal slices it is  possible there is trace increase in blood products along the  craniotomy flap. Dominant residual is still left parafalcine at the  frontal parietal junction measuring up to 12 mm in thickness. Trace  midline shift without entrapment, stable on coronal reformats. No  evidence of infarct.      SLP Plan  Continue with current plan of care      Recommendations for follow up therapy are one component of a multi-disciplinary discharge planning process, led by the attending physician.  Recommendations may be updated based on patient status, additional functional criteria and insurance authorization.    Recommendations  Diet recommendations: Dysphagia 1 (puree);Thin liquid Liquids provided via: Cup;Straw (monitor) Medication Administration: Crushed with puree Supervision: Patient able to self feed;Staff to assist with self feeding;Full supervision/cueing for compensatory strategies Compensations: Minimize environmental distractions;Slow rate;Small sips/bites;Lingual sweep for clearance of pocketing;Follow solids with liquid Postural Changes and/or Swallow Maneuvers: Out of bed for meals;Seated upright 90 degrees;Upright 30-60 min after meal                 (Dietician f/u) Oral care BID;Oral care before and after PO;Staff/trained caregiver to provide oral care (Denture care)   Frequent or constant Supervision/Assistance       Continue with current plan of care      Jerilynn Som, MS, CCC-SLP Speech Language Pathologist Rehab Services; [place] [facility] [phone] (ascom) [name]  06/27/2023, 2:39 PM

## 2023-06-27 NOTE — Progress Notes (Signed)
At 1830 patient continues in pain, provider notified and placed one time order for dilaudid.

## 2023-06-27 NOTE — Progress Notes (Signed)
Returned from CT, additional dose of 50 mcg of fentanyl appears to have worked, patient is resting quietly in bed respirations even and unlabored.

## 2023-06-27 NOTE — Consult Note (Signed)
PHARMACY CONSULT NOTE - FOLLOW UP  Pharmacy Consult for Electrolyte Monitoring and Replacement   Recent Labs: Potassium (mmol/L)  Date Value  06/27/2023 3.4 (L)  11/22/2013 3.3 (L)   Magnesium (mg/dL)  Date Value  29/51/8841 2.0  11/22/2013 1.7 (L)   Calcium (mg/dL)  Date Value  66/10/3014 8.5 (L)   Calcium, Total (mg/dL)  Date Value  05/19/3233 8.7   Albumin (g/dL)  Date Value  57/32/2025 4.1  11/21/2013 3.8   Sodium (mmol/L)  Date Value  06/27/2023 136  02/27/2020 128 (L)  11/22/2013 138     Assessment: 73yo female who presented with a subdural hematoma.  Due to ongoing brain compression and symptoms, surgical intervention was recommended. S/p procedure 2/15.   Goal of Therapy:  WNL  Plan:  Medical team ordered Kcl 10 mEq IV x 4 and Mg 2 g IV x 1. F/u with AM labs.   Ronnald Ramp ,PharmD Clinical Pharmacist 06/27/2023 7:45 AM

## 2023-06-27 NOTE — Progress Notes (Signed)
Gave 25 mcg of fentanyl at 2347, she calmed down and was able to rest until 0200 when she started crying for momma again and crying Oh God.

## 2023-06-27 NOTE — Progress Notes (Addendum)
Patient c/o headache worsening in frequency, intensity and severity. The headache is constant and located predominantly in the occipital region. She is unable to describes the pain but is screaming and holding her head with both hands. Headaches partially responds to current analgesics. There is no associated vision disturbance, altered sensorium, speech abnormality, cranial nerve deficit, seizures, focal motor or sensory deficits, nausea, or vomiting. She is however noted to be hypertensive and tachycardic on the monitor. A STAT head CT obtained as below and additional pain med administered. Discussed with on call Neurosurgeon Dr. Marcell Barlow  CT Head on: 06/26/2022 FINDINGS: Brain: No significant reaccumulation of the recently evaluated subdural hematoma on the left, on a few coronal slices it is possible there is trace increase in blood products along the craniotomy flap. Dominant residual is still left parafalcine at the frontal parietal junction measuring up to 12 mm in thickness. Trace midline shift without entrapment, stable on coronal reformats. No evidence of infarct   Webb Silversmith, DNP, CCRN, FNP-C, AGACNP-BC Acute Care & Family Nurse Practitioner  [facility]  See Amion for personal pager PCCM on call pager [phone] until 7 am

## 2023-06-27 NOTE — Evaluation (Signed)
Physical Therapy Evaluation Patient Details Name: [name] MRN: [id number] DOB: 06/21/1950 Today's Date: 06/27/2023  History of Present Illness  73 y.o female with significant PMH of PAF, IBS, diverticulosis, DVT on Eliquis, HTN, IDA, HLD, OSA, anxiety and depression who presented to the ED with chief complaints of severe headache. Admitted with traumatic subdural hematoma s/p craniotomy for evacuation on 06/25/2023.   Clinical Impression  Patient received in bed getting bath. She is continually moaning throughout session. Patient requires mod +2 for rolling and mod A for supine to sit. Patient is able to sit edge of bed with min/mod A for balance. She is unable to attempt standing at this time.  Patient has limited ability to follow commands. She will continue to benefit from skilled PT to improve independence, strength and safety.          If plan is discharge home, recommend the following: Two people to help with walking and/or transfers;Two people to help with bathing/dressing/bathroom;Assistance with feeding;Direct supervision/assist for medications management;Assistance with cooking/housework;Direct supervision/assist for financial management   Can travel by private vehicle   No    Equipment Recommendations Other (comment) (TBD)  Recommendations for Other Services       Functional Status Assessment Patient has had a recent decline in their functional status and demonstrates the ability to make significant improvements in function in a reasonable and predictable amount of time.     Precautions / Restrictions Precautions Precautions: Fall Recall of Precautions/Restrictions: Impaired Restrictions Weight Bearing Restrictions Per Provider Order: No      Mobility  Bed Mobility Overal bed mobility: Needs Assistance Bed Mobility: Supine to Sit, Sit to Supine, Rolling Rolling: Mod assist   Supine to sit: Mod assist, +2 for physical assistance, HOB elevated Sit to supine:  Mod assist, +2 for physical assistance, HOB elevated        Transfers                   General transfer comment: not attempted    Ambulation/Gait                  Stairs            Wheelchair Mobility     Tilt Bed    Modified Rankin (Stroke Patients Only)       Balance Overall balance assessment: Needs assistance Sitting-balance support: Feet unsupported Sitting balance-Leahy Scale: Poor Sitting balance - Comments: poor sitting balance, requiring assistance at all times Postural control: Right lateral lean, Posterior lean, Left lateral lean     Standing balance comment: not attempted                             Pertinent Vitals/Pain Pain Assessment Pain Assessment: PAINAD Breathing: occasional labored breathing, short period of hyperventilation Negative Vocalization: occasional moan/groan, low speech, negative/disapproving quality Body Language: tense, distressed pacing, fidgeting Consolability: distracted or reassured by voice/touch Pain Intervention(s): Monitored during session    Home Living Family/patient expects to be discharged to:: Private residence Living Arrangements: Alone Available Help at Discharge: Family;Available PRN/intermittently Type of Home: Mobile home Home Access: Stairs to enter Entrance Stairs-Rails: Doctor, general practice of Steps: 5   Home Layout: One level Home Equipment: None Additional Comments: per sister in law    Prior Function Prior Level of Function : Independent/Modified Independent;Driving             Mobility Comments: independent prior to admission ADLs Comments: independent  Extremity/Trunk Assessment   Upper Extremity Assessment Upper Extremity Assessment: Defer to OT evaluation RUE Deficits / Details: grossly 0/5 (assessment limited by a-line)  with hypertonicity noted    Lower Extremity Assessment Lower Extremity Assessment: RLE deficits/detail RLE  Deficits / Details: rigidity in R LE, tremors noted throughout LEs. RLE Coordination: decreased gross motor    Cervical / Trunk Assessment Cervical / Trunk Assessment: Normal  Communication   Communication Communication: Impaired Factors Affecting Communication: Difficulty expressing self    Cognition Arousal: Lethargic Behavior During Therapy: Restless   PT - Cognitive impairments: Difficult to assess, Awareness, Attention, Problem solving, Safety/Judgement Difficult to assess due to: Impaired communication, Level of arousal                       Following commands: Impaired Following commands impaired: Follows one step commands inconsistently     Cueing Cueing Techniques: Verbal cues     General Comments      Exercises     Assessment/Plan    PT Assessment Patient needs continued PT services  PT Problem List Decreased strength;Decreased range of motion;Decreased activity tolerance;Decreased balance;Decreased mobility;Decreased coordination;Decreased cognition;Decreased safety awareness;Pain;Decreased knowledge of precautions       PT Treatment Interventions DME instruction;Functional mobility training;Therapeutic activities;Therapeutic exercise;Balance training;Neuromuscular re-education;Cognitive remediation;Patient/family education    PT Goals (Current goals can be found in the Care Plan section)  Acute Rehab PT Goals Patient Stated Goal: patient unable to state PT Goal Formulation: Patient unable to participate in goal setting Time For Goal Achievement: 07/09/23 Potential to Achieve Goals: Fair    Frequency Min 1X/week     Co-evaluation PT/OT/SLP Co-Evaluation/Treatment: Yes Reason for Co-Treatment: To address functional/ADL transfers;Necessary to address cognition/behavior during functional activity;For patient/therapist safety;Complexity of the patient's impairments (multi-system involvement) PT goals addressed during session: Mobility/safety with  mobility;Balance OT goals addressed during session: ADL's and self-care       AM-PAC PT "6 Clicks" Mobility  Outcome Measure Help needed turning from your back to your side while in a flat bed without using bedrails?: A Lot Help needed moving from lying on your back to sitting on the side of a flat bed without using bedrails?: A Lot Help needed moving to and from a bed to a chair (including a wheelchair)?: Total Help needed standing up from a chair using your arms (e.g., wheelchair or bedside chair)?: Total Help needed to walk in hospital room?: Total Help needed climbing 3-5 steps with a railing? : Total 6 Click Score: 8    End of Session   Activity Tolerance: Patient limited by fatigue;Patient limited by lethargy Patient left: in bed;with family/visitor present;with call bell/phone within reach Nurse Communication: Mobility status PT Visit Diagnosis: Muscle weakness (generalized) (M62.81);Other abnormalities of gait and mobility (R26.89);Hemiplegia and hemiparesis Hemiplegia - Right/Left: Right Hemiplegia - caused by: Other cerebrovascular disease    Time: 1346-1400 PT Time Calculation (min) (ACUTE ONLY): 14 min   Charges:   PT Evaluation $PT Eval Moderate Complexity: 1 Mod   PT General Charges $$ ACUTE PT VISIT: 1 Visit         Ermin Parisien, PT, GCS 06/27/23,2:45 PM

## 2023-06-27 NOTE — Progress Notes (Signed)
Attempted to place cerebel on patient, family present at bedside during this time and helpful to keep patient calm while placing. Upon turning the knobs to position for reading patient screamed in pain and immediately moved.  Patient would not allow further placement due to the cerebel touching her incision site.  Will notify provider.

## 2023-06-27 NOTE — Consult Note (Incomplete)
NEUROLOGY CONSULT NOTE   Date of service: June 27, 2023 Patient Name: [name] MRN:  [id number] DOB:  06/26/1950 Chief Complaint: abnormal BLE movements after L SDH s/p evacuation Requesting Provider: Janann Colonel, MD  History of Present Illness   [name] is a 73 y.o. female with hx of HTN, HL, DVT on eliquis prior to admission who presented to [facility] ED for AMS and severe headache for the past 3-4 days. She was noted to be screaming in pain on arrival to ED. Also in ED she is reported to have had a possible seizure although I cannot find specifics in the chart to indicate what the semiology was. She was subsequently started on keppra 500mg  q 12 hrs. Head CT showed large left cerebral convexity subdural hematoma measuring up to 1.1 cm with a 1.2 cm rightward midline shift as well as early uncal herniation.  There is mass effect on the left lateral ventricular system with asymmetric enlargement of the left temporal horn which can be seen in the setting of early ventricular entrapment.  Patient was taken emergently to the OR for craniotomy and evacuation of the hematoma. Since that time per neurosurgeon Dr. Myer Haff she has improved from a mental status standpoint but still has significant residual expressive aphasia and RUE weakness. RN noted bilateral leg tremors today and neurology was consulted 2/2 concern for possible seizure. Family at bedside described the event as tremors in both legs (not arms) and no change in mental status from recent baseline during event. These episodes are generally provoked by movement and pain (receiving a bath for example).    ROS   Unable to ascertain due to expressive aphasia  Past History   Past Medical History:  Diagnosis Date   Bradycardia    Chest pain    Constipation    Depression    Diverticulitis    Diverticulosis of colon    DOE (dyspnea on exertion)    DVT (deep venous thrombosis) (HCC)    Esophageal stricture     Hyperlipemia    Hypertension    IBS (irritable bowel syndrome)    Sleep apnea     Past Surgical History:  Procedure Laterality Date   APPENDECTOMY     BLOOD CLOT  2004   RIGHT LEG   CHOLECYSTECTOMY     COLON SURGERY     GALLBLADDER SURGERY  2003   NISSEN FUNDOPLICATION     PLANTAR FASCIA SURGERY  2007   sigmoid colectomy  10/21/2010   pelvic anastomosis   VAGINAL HYSTERECTOMY      Family History: Family History  Problem Relation Age of Onset   Heart disease Mother    Crohn's disease Sister    Colon polyps Sister    Irritable bowel syndrome Brother    Brain cancer Father    Colitis Sister    Diabetes Sister    Breast cancer Cousin    Colon cancer Neg Hx     Social History  reports that she has never smoked. She has never used smokeless tobacco. She reports that she does not drink alcohol and does not use drugs.  Allergies  Allergen Reactions   Dicyclomine Other (See Comments)    REACTION: Choking   Lisinopril Other (See Comments)    REACTION: Choking   Losartan Shortness Of Breath   Ace Inhibitors Other (See Comments)    Unknown to patient   Penicillins Itching and Rash    Medications   Current Facility-Administered Medications:  Chlorhexidine Gluconate Cloth 2 % PADS 6 each, 6 each, Topical, Daily, Assaker, Jean-Pierre, MD, 6 each at 06/27/23 1428   docusate sodium (COLACE) capsule 100 mg, 100 mg, Oral, BID PRN, Assaker, [place]ali, MD   famotidine (PEPCID) IVPB 20 mg premix, 20 mg, Intravenous, Q12H, Paliwal, Aditya, MD, Stopped at 06/27/23 1243   fentaNYL (SUBLIMAZE) injection 25 mcg, 25 mcg, Intravenous, Q4H PRN, Assaker, [place] Bali, MD, 25 mcg at 06/27/23 1808   heparin injection 5,000 Units, 5,000 Units, Subcutaneous, Q12H, Assaker, [place] Bali, MD, 5,000 Units at 06/27/23 1428   labetalol (NORMODYNE) injection 10 mg, 10 mg, Intravenous, Q2H PRN, Assaker, [place] Bali, MD   levETIRAcetam (KEPPRA) IVPB 500 mg/100 mL premix, 500 mg, Intravenous, Q12H,  Ouma, Hubbard Hartshorn, NP, Stopped at 06/27/23 1201   LORazepam (ATIVAN) injection 0.5 mg, 0.5 mg, Intravenous, Once, Ouma, Hubbard Hartshorn, NP   LORazepam (ATIVAN) injection 0.5 mg, 0.5 mg, Intravenous, Q8H PRN, Assaker, [place] Bali, MD, 0.5 mg at 06/27/23 1619   nicardipine (CARDENE) 20mg  in 0.86% saline IV infusion (0.1 mg/ml), 3-15 mg/hr, Intravenous, Continuous, Louie Boston, MD, Last Rate: 50 mL/hr at 06/27/23 1916, 5 mg/hr at 06/27/23 1916   polyethylene glycol (MIRALAX / GLYCOLAX) packet 17 g, 17 g, Oral, Daily PRN, Janann Colonel, MD  Vitals   Vitals:   06/27/23 1808 06/27/23 1830 06/27/23 1900 06/27/23 1915  BP:   (!) 120/50   Pulse: (!) 117 (!) 113 96 (!) 101  Resp: (!) 21 19 19 15   Temp: 99.9 F (37.7 C) 98.4 F (36.9 C) (!) 100.6 F (38.1 C) (!) 100.6 F (38.1 C)  TempSrc: Bladder Bladder Bladder Bladder  SpO2: 100% 100% 98% 98%  Weight:        Body mass index is 29.28 kg/m.  Physical Exam   Gen: patient lying in bed, NAD when laying still CV: extremities appear well-perfused Resp: normal WOB  Neurologic Examination   MS: alert, oriented to self, hospital, and 2 family members at bedside. Able to follow most simple commands Speech: mild dysarthria, moderate expressive aphasia, unable to name or repeat CN: pupils 3mm ERRL, EOMI, sensation intact, face symmetric, hearing intact to voice Motor: RUE no movement, LUE drift but not to bed, BLE drift to bed symmetric Coordination: UTA 2/2 AMS Gait: deferred   Labs/Imaging/Neurodiagnostic studies   CBC:  Recent Labs  Lab 07/24/23 1333 2023/07/24 2331 06/26/23 0615 06/27/23 0244  WBC 6.4   < > 12.0* 11.7*  NEUTROABS 2.9  --   --   --   HGB 6.1*   < > 12.3 12.4  HCT 18.3*   < > 35.3* 36.4  MCV 94.8   < > 88.5 89.7  PLT 143*   < > 180 172   < > = values in this interval not displayed.   Basic Metabolic Panel:  Lab Results  Component Value Date   NA 134 (L) 06/27/2023   K 3.4 (L)  06/27/2023   CO2 22 06/27/2023   GLUCOSE 87 06/27/2023   BUN 11 06/27/2023   CREATININE 0.63 06/27/2023   CALCIUM 8.5 (L) 06/27/2023   GFRNONAA >60 06/27/2023   GFRAA 80 02/27/2020   Lipid Panel:  Lab Results  Component Value Date   LDLCALC 135 (H) 07/28/2016   HgbA1c: No results found for: "HGBA1C" Urine Drug Screen: No results found for: "LABOPIA", "COCAINSCRNUR", "LABBENZ", "AMPHETMU", "THCU", "LABBARB"  Alcohol Level No results found for: "ETH" INR  Lab Results  Component Value Date   INR 1.4 (H) 07/24/2023  APTT  Lab Results  Component Value Date   APTT 29 03/01/2022   AED levels: No results found for: "PHENYTOIN", "ZONISAMIDE", "LAMOTRIGINE", "LEVETIRACETA"  CT Head without contrast(Personally reviewed): 2/14 1. Large left cerebral convexity subdural hematoma measuring up to 1.1 cm with a 1.2 cm rightward midline shift as well as early uncal herniation. 2. Mass effect on the left lateral ventricular system with asymmetric enlargement of the left temporal horn, which can be seen in the setting of early ventricular entrapment. 3. No acute cervical spine fracture.  CT Head without contrast(Personally reviewed): 2/15 Resolved midline shift after subdural hematoma evacuation. No complicating features.  MRI Brain(Personally reviewed): 1. Post-operative changes from prior left subdural hematoma evacuation. The residual subdural hematoma is stable in size as compared to the head CT performed earlier today. As before, the largest component is located along the posterior falx, measuring 12 mm in thickness. 2. A thin subdural collection (likely hematoma) is also present along the mid and anterior falx, and left cerebral convexity (measuring up to 3 mm in thickness). 3. Mild chronic small vessel ischemic changes within the cerebral white matter and pons. 4. Few chronic microhemorrhages within the left parietal and occipital lobes.   ASSESSMENT   73 yo woman  presented to ED with 3-4 days of AMS and severe h/a and found to have large L subdural with 1.2cm MLS, early uncal herniation, and impending ventricular entrapment. Taken to OR for craniotomy and hematoma evacuation. MS improved but still has expressive aphasia and can't move RUE. Possible seizure in ED although no specifics in chart what it looked like. She is now on keppra 500mg  bid. RN reports bilateral leg tremors that do not sound epileptic. The movements are not rhythmic, do not spread to the arms, and are not associated with change in mental status. It would be highly unusual for motor seizure to occur bilaterally without change in mental status. That said, it is worth getting an EEG based on report of possible seizure in the ED as well as her persistent R-sided weakness and aphasia. It is also worth repeating brain MRI to assess for evolving ischemia.   RECOMMENDATIONS   - Repeat MRI brain is scheduled for later this afternoon - Ceribell rapid EEG was ordered for overnight but patient was unable to tolerate it 2/2 one of the electrodes on the headband being too close to her incision site. Ceribell cancelled. - rEEG tomorrow - I have asked family at bedside to capture video of abnormal movements if they occur so that neurology can review them - Will continue to follow  This patient is critically ill and at significant risk of neurological worsening, death and care requires constant monitoring of vital signs, hemodynamics,respiratory and cardiac monitoring, neurological assessment, discussion with family, other specialists and medical decision making of high complexity. I spent 60 minutes of neurocritical care time  in the care of  this patient. This was time spent independent of any time provided by nurse practitioner or PA.  Bing Neighbors, MD [facility] [phone]  If 7pm- 7am, please page neurology on call as listed in AMION.

## 2023-06-27 NOTE — Progress Notes (Signed)
Returned from MRI with patient, patient tolerated trip with no adverse events.

## 2023-06-27 NOTE — Progress Notes (Signed)
Neurosurgery Progress Note  History: [name] is here for subdural hematoma s/p craniotomy for evacuation on 06/25/2023.  POD2: She had agitation overnight.  Continues to have difficulty with speech. POD1: She is agitated but has improved compared to pre-op.  Physical Exam: Vitals:   06/27/23 0700 06/27/23 0800  BP:    Pulse: 70 78  Resp: 14 11  Temp:  98.4 F (36.9 C)  SpO2: 97% 99%   Speech is improved but not normal.  She does say words and phrases but is difficult to understand. AA Ox self, hospital, February, 2025 (with choices) CNI with  R facial droop  Strength:5/5 throughout left upper and lower extremity. RLE withdraws quickly, at least 4/5 RUE 1-2/5   Data:  Other tests/results: HCT stable this AM - no evidence of recurrent SDH or stroke  Assessment/Plan:  [name] has shown some improvement after subdural hematoma evacuation but has continued speech abnormality and RUE paresis  - mobilize - pain control - minimize narcotics - DVT prophylaxis - PTOTST - ok to start - Drain removed - Continue keppra - EEG and MRI brain - will consult neurology if any stroke or seizure activity is seen.   Venetia Night MD, [facility] Department of Neurosurgery

## 2023-06-27 NOTE — Progress Notes (Signed)
[facility] ADULT ICU REPLACEMENT PROTOCOL   The patient does apply for the [facility] Adult ICU Electrolyte Replacment Protocol based on the criteria listed below:   1.Exclusion criteria: TCTS, ECMO, Dialysis, and Myasthenia Gravis patients 2. Is GFR >/= 30 ml/min? Yes.    Patient's GFR today is >60 3. Is SCr </= 2? Yes.   Patient's SCr is 0.63 mg/dL 4. Did SCr increase >/= 0.5 in 24 hours? No. 5.Pt's weight >40kg  Yes.   6. Abnormal electrolyte(s): K  7. Electrolytes replaced per protocol 8.  Call MD STAT for K+ </= 2.5, Phos </= 1, or Mag </= 1 Physician:  Flossie Buffy E Rue Valladares 06/27/2023 4:23 AM

## 2023-06-27 NOTE — Progress Notes (Addendum)
NAME:  [name], MRN:  [id number], DOB:  [birth year], LOS: 2 ADMISSION DATE:  06/25/2023,  History of Present Illness:  73 y.o female with significant PMH of PAF, IBS, diverticulosis, DVT on Eliquis, HTN, IDA, HLD, OSA, anxiety and depression who presented to the ED with chief complaints of severe headache.   Per ED reports, patient apparently fell on Monday hitting the back of her head and may have lost consciousness per her brother.  Patient complained of posterior headaches for about 2 to 3 days but refused to seek care.  She went out to lunch today with friends and began complaining of severe headache.  EMS was called and patient transported to the ED.   ED Course: Initial vital signs showed HR of 60 beats/minute, BP 138/70 mm Hg, the RR 18 breaths/minute, and the oxygen saturation 100% on RA and a temperature of 97.37F (36.4C). Patient was noted with change in mental status while waiting in triage.  Per triage nurse, patient suddenly stopped responding and was drooling at the corner of her mouth with some stiffening of her hands and left sided gaze.  She was given 1 mg Ativan IV and immediately taken to a room where stat head CT obtained.     Pertinent Labs/Diagnostics Findings: Unremarkable CMP WBC: 6.4K/L, Hgb/Hct: 6.1/18.3 Plts: 143 CTH> large left subdural hematoma with mass effect on the left lateral ventricles    Given finding as above, neurosurgery was consulted and patient taken emergently to the OR for decompression.  Patient transferred to the ICU postprocedure and PCCM consulted.  Pertinent  Medical History  PAF, IBS, diverticulosis, DVT on Eliquis, HTN, IDA, HLD, OSA, anxiety and depression   Significant Hospital Events: Including procedures, antibiotic start and stop dates in addition to other pertinent events   2/14: Admitted with traumatic subdural hematoma s/p emergent crani for hematoma evacuation.  Extubated and transferred to the ICU.  PCCM consulted  Interim  History / Subjective:  More awake this a.m.  Able to recognize her brother at bedside.  Speech remains not well coherent.  She is moving all extremities except her right arm.  Objective   Blood pressure (!) 150/63, pulse 82, temperature 98.6 F (37 C), resp. rate 16, weight 70.3 kg, SpO2 95%.        Intake/Output Summary (Last 24 hours) at 06/27/2023 1305 Last data filed at 06/27/2023 1026 Gross per 24 hour  Intake 811.74 ml  Output 940 ml  Net -128.26 ml   Filed Weights   06/26/23 0600  Weight: 70.3 kg   Subjective:  Overnight with more agitation. Repeat CT head with no recurrent hematoma.   Examination: General: awake and alert but restless and confused. Speech mostly incoherent HENT: Supple neck reactive pupils Lungs: Clear bilateral air entry Cardiovascular: Normal S1, normal S2, regular rate and rhythm Abdomen: Soft nontender nondistended positive bowel sounds Extremities: Warm well-perfused no edema Neuro: Able to move all extremities except for right arm.  Pupils are reactive.  Alert and oriented.  Labs and imaging were reviewed.  Assessment & Plan:  Case of a 73 year old female patient with a past medical history of paroxysmal A-fib on Eliquis presenting for altered mental status and fall.  She was found to have a large left cerebral convexity subdural hematoma with a 1.2 cm rightward midline shift as well as early uncal herniation.  She is status post craniotomy and left subdural hematoma evacuation on 06/25/2023.  # Fall complicated by large left subdural hematoma with rightward midline  shift and early uncal herniation status post craniotomy and left subdural hematoma evacuation on 06/25/2023.  Eliquis reversed with andnexa on 06/25/2023. Drain removed on 06/27/2023. #Agitation likely in the setting of delirium, narcotic and benzo use.  # Paroxysmal A-fib holding Eliquis # Hypertension # Hyperlipidemia  Neuro: Pain management, Tylenol 1000mg  Q6H sched, Fentanyl 25  mcg Q3H PRN for severe pain. Ativan 0.5 IV Q8H. Can use precedex if needed. Keppra for Seizure prophylaxis. Can use precedex if needed.  PT/OT. Will assess recovery today and speech and if unable to take PO --> NG tube placement. MRI and EEG per neurosurgery.  CVS: Normotensive on nicardipine drip. Once enteral access achieved we will start po medications.  Lungs: No issues  GI: Famotidine for ulcer prophylaxis. NPO for now. Speech eval.  Renal: Monitor urine output.  Heme: SCDs for DVT prophylaxis.   Best Practice (right click and "Reselect all SmartList Selections" daily)   Diet/type: NPO for nwo pending speech. If does not pass will need NGT placed tomorrow.  DVT prophylaxis Start heparin 5000U Dibble BID.  Pressure ulcer(s): N/A GI prophylaxis: H2B Lines: N/A Foley:  N/A Code Status:  full code  Last date of multidisciplinary goals of care discussion [06/27/2023]  Critical care time: 35 minutes    Janann Colonel, MD [facility] 06/27/2023 1:05 PM

## 2023-06-27 NOTE — Progress Notes (Signed)
Order received to cancel cerebel by provider as patient is unable to tolerate.

## 2023-06-27 NOTE — Evaluation (Signed)
Occupational Therapy Evaluation Patient Details Name: [name] MRN: [id number] DOB: 10-20-1950 Today's Date: 06/27/2023   History of Present Illness   73 y.o female with significant PMH of PAF, IBS, diverticulosis, DVT on Eliquis, HTN, IDA, HLD, OSA, anxiety and depression who presented to the ED with chief complaints of severe headache. Admitted with traumatic subdural hematoma s/p craniotomy for evacuation on 06/25/2023.    Clinical Impressions [name] was seen for OT evaluation this date. Prior to hospital admission, pt was IND. Pt lives alone. Pt presents to acute OT demonstrating impaired ADL performance and functional mobility 2/2 decreased activity tolerance and functional strength/ROM/balance deficits. Pt does not answer any questions this session but does follow limited commands. Crying "mama" and moaning t/o session. No active RUE movement noted however assessment limited by a-line.  Pt currently requires MAX A rolling and bathing at bed level. MOD A sup>sit, pt initiates movement however poor sitting balance/tolerance with R lateral lean noted. Pt would benefit from skilled OT to address noted impairments and functional limitations (see below for any additional details). Upon hospital discharge, recommend OT follow up <3 hours/day.     If plan is discharge home, recommend the following:   Two people to help with walking and/or transfers;Two people to help with bathing/dressing/bathroom     Functional Status Assessment   Patient has had a recent decline in their functional status and demonstrates the ability to make significant improvements in function in a reasonable and predictable amount of time.     Equipment Recommendations   Other (comment) (defer)     Recommendations for Other Services         Precautions/Restrictions   Precautions Precautions: Fall Recall of Precautions/Restrictions: Impaired Restrictions Weight Bearing Restrictions Per Provider  Order: No     Mobility Bed Mobility Overal bed mobility: Needs Assistance Bed Mobility: Supine to Sit, Rolling, Sit to Supine Rolling: Max assist   Supine to sit: Mod assist, +2 for safety/equipment Sit to supine: Mod assist, +2 for physical assistance        Transfers                   General transfer comment: unsafe to attempt      Balance Overall balance assessment: Needs assistance Sitting-balance support: Feet supported, Bilateral upper extremity supported Sitting balance-Leahy Scale: Poor                                     ADL either performed or assessed with clinical judgement   ADL Overall ADL's : Needs assistance/impaired                                       General ADL Comments: MAX A rolling and bathing at bed level      Pertinent Vitals/Pain Pain Assessment Pain Assessment: CPOT Facial Expression: Tense Body Movements: Protection Muscle Tension: Tense, rigid Compliance with ventilator (intubated pts.): N/A Vocalization (extubated pts.): Sighing, moaning CPOT Total: 4 Pain Intervention(s): Limited activity within patient's tolerance, Repositioned     Extremity/Trunk Assessment Upper Extremity Assessment Upper Extremity Assessment: Defer to OT evaluation RUE Deficits / Details: grossly 0/5 (assessment limited by a-line)  with hypertonicity noted   Lower Extremity Assessment Lower Extremity Assessment: RLE deficits/detail RLE Deficits / Details: rigidity in R LE, tremors noted throughout LEs. RLE  Coordination: decreased gross motor   Cervical / Trunk Assessment Cervical / Trunk Assessment: Normal   Communication Communication Communication: Impaired Factors Affecting Communication: Difficulty expressing self   Cognition Arousal: Lethargic Behavior During Therapy: Restless Cognition: Difficult to assess, Cognition impaired Difficult to assess due to: Level of arousal       Attention impairment  (select first level of impairment): Sustained attention   OT - Cognition Comments: crying out t/o session stating "mama" and moaning.                 Following commands: Impaired Following commands impaired: Follows one step commands inconsistently     Cueing  General Comments   Cueing Techniques: Verbal cues      Exercises     Shoulder Instructions      Home Living Family/patient expects to be discharged to:: Private residence Living Arrangements: Alone Available Help at Discharge: Family;Available PRN/intermittently Type of Home: Mobile home Home Access: Stairs to enter Entrance Stairs-Number of Steps: 5 Entrance Stairs-Rails: Right;Left Home Layout: One level     Bathroom Shower/Tub: Tub/shower unit         Home Equipment: None   Additional Comments: per sister in law      Prior Functioning/Environment Prior Level of Function : Independent/Modified Independent;Driving             Mobility Comments: independent prior to admission ADLs Comments: independent    OT Problem List: Decreased strength;Decreased range of motion;Decreased activity tolerance;Impaired balance (sitting and/or standing);Decreased cognition;Decreased safety awareness   OT Treatment/Interventions: Self-care/ADL training;Therapeutic exercise;Energy conservation;DME and/or AE instruction;Therapeutic activities;Patient/family education;Balance training      OT Goals(Current goals can be found in the care plan section)   Acute Rehab OT Goals Patient Stated Goal: to return to PLOF OT Goal Formulation: With family Time For Goal Achievement: 07/11/23 Potential to Achieve Goals: Fair ADL Goals Pt Will Perform Eating: with set-up;with supervision;bed level Pt Will Perform Grooming: with min assist;sitting Pt Will Transfer to Toilet: with mod assist;squat pivot transfer;bedside commode   OT Frequency:  Min 1X/week    Co-evaluation PT/OT/SLP Co-Evaluation/Treatment:  Yes Reason for Co-Treatment: To address functional/ADL transfers;Necessary to address cognition/behavior during functional activity;For patient/therapist safety;Complexity of the patient's impairments (multi-system involvement) PT goals addressed during session: Mobility/safety with mobility;Balance OT goals addressed during session: ADL's and self-care      AM-PAC OT "6 Clicks" Daily Activity     Outcome Measure Help from another person eating meals?: A Lot Help from another person taking care of personal grooming?: A Lot Help from another person toileting, which includes using toliet, bedpan, or urinal?: A Lot Help from another person bathing (including washing, rinsing, drying)?: A Lot Help from another person to put on and taking off regular upper body clothing?: A Lot Help from another person to put on and taking off regular lower body clothing?: A Lot 6 Click Score: 12   End of Session Nurse Communication: Mobility status  Activity Tolerance: Patient tolerated treatment well Patient left: in bed;with call bell/phone within reach;with family/visitor present  OT Visit Diagnosis: Other abnormalities of gait and mobility (R26.89);Muscle weakness (generalized) (M62.81)                Time: 1345-1400 OT Time Calculation (min): 15 min Charges:  OT General Charges $OT Visit: 1 Visit OT Evaluation $OT Eval Moderate Complexity: 1 Mod  Kathie Dike, M.S. OTR/L  06/27/23, 2:42 PM  ascom [phone]

## 2023-06-27 NOTE — Progress Notes (Signed)
Patient transferred to MRI with this nurse, on cardiac monitoring, oxygen via nasal cannula, and cardene gtt.

## 2023-06-27 NOTE — Progress Notes (Signed)
Order received for tele-sitter.

## 2023-06-27 NOTE — Progress Notes (Signed)
Crying and moaning in apparent pain, crying Oh God: Holding head and writhing in bed. Notified NP Ouma, awaiting orders.

## 2023-06-28 ENCOUNTER — Ambulatory Visit: Payer: PPO

## 2023-06-28 DIAGNOSIS — R569 Unspecified convulsions: Secondary | ICD-10-CM | POA: Diagnosis not present

## 2023-06-28 DIAGNOSIS — S065XAA Traumatic subdural hemorrhage with loss of consciousness status unknown, initial encounter: Secondary | ICD-10-CM | POA: Diagnosis not present

## 2023-06-28 LAB — CBC
HCT: 34.4 % — ABNORMAL LOW (ref 36.0–46.0)
Hemoglobin: 12.3 g/dL (ref 12.0–15.0)
MCH: 31.6 pg (ref 26.0–34.0)
MCHC: 35.8 g/dL (ref 30.0–36.0)
MCV: 88.4 fL (ref 80.0–100.0)
Platelets: 206 10*3/uL (ref 150–400)
RBC: 3.89 MIL/uL (ref 3.87–5.11)
RDW: 14.1 % (ref 11.5–15.5)
WBC: 11.2 10*3/uL — ABNORMAL HIGH (ref 4.0–10.5)
nRBC: 0 % (ref 0.0–0.2)

## 2023-06-28 LAB — BASIC METABOLIC PANEL
Anion gap: 10 (ref 5–15)
BUN: 11 mg/dL (ref 8–23)
CO2: 22 mmol/L (ref 22–32)
Calcium: 8.1 mg/dL — ABNORMAL LOW (ref 8.9–10.3)
Chloride: 102 mmol/L (ref 98–111)
Creatinine, Ser: 0.6 mg/dL (ref 0.44–1.00)
GFR, Estimated: >60 mL/min (ref >60)
Glucose, Bld: 71 mg/dL (ref 70–99)
Potassium: 3.8 mmol/L (ref 3.5–5.1)
Sodium: 134 mmol/L — ABNORMAL LOW (ref 135–145)

## 2023-06-28 LAB — GLUCOSE, CAPILLARY
Glucose-Capillary: 69 mg/dL — ABNORMAL LOW (ref 70–99)
Glucose-Capillary: 218 mg/dL — ABNORMAL HIGH (ref 70–99)

## 2023-06-28 LAB — SODIUM
Sodium: 136 mmol/L (ref 135–145)
Sodium: 136 mmol/L (ref 135–145)

## 2023-06-28 LAB — MAGNESIUM: Magnesium: 1.8 mg/dL (ref 1.7–2.4)

## 2023-06-28 MED ORDER — POTASSIUM CHLORIDE 20 MEQ PO PACK
20.0000 meq | PACK | Freq: Once | ORAL | Status: AC
  Administered 2023-06-28: 20 meq via ORAL
  Filled 2023-06-28: qty 1

## 2023-06-28 MED ORDER — HYDROMORPHONE HCL 1 MG/ML IJ SOLN
0.5000 mg | Freq: Once | INTRAMUSCULAR | Status: AC
  Administered 2023-06-28: 0.5 mg via INTRAVENOUS
  Filled 2023-06-28: qty 1

## 2023-06-28 MED ORDER — DEXTROSE 50 % IV SOLN
50.0000 mL | Freq: Once | INTRAVENOUS | Status: AC
  Administered 2023-06-28: 50 mL via INTRAVENOUS

## 2023-06-28 MED ORDER — ACETAMINOPHEN 10 MG/ML IV SOLN
1000.0000 mg | Freq: Four times a day (QID) | INTRAVENOUS | Status: AC | PRN
Start: 2023-06-29 — End: 2023-06-29
  Administered 2023-06-29: 1000 mg via INTRAVENOUS
  Filled 2023-06-28: qty 100

## 2023-06-28 MED ORDER — ENSURE ENLIVE PO LIQD
237.0000 mL | Freq: Two times a day (BID) | ORAL | Status: DC
  Administered 2023-06-28 (×2): 237 mL via ORAL

## 2023-06-28 MED ORDER — ACETAMINOPHEN 10 MG/ML IV SOLN
1000.0000 mg | Freq: Four times a day (QID) | INTRAVENOUS | Status: AC
  Administered 2023-06-28 (×4): 1000 mg via INTRAVENOUS
  Filled 2023-06-28 (×4): qty 100

## 2023-06-28 MED ORDER — LORAZEPAM 2 MG/ML IJ SOLN
0.5000 mg | Freq: Once | INTRAMUSCULAR | Status: AC
  Administered 2023-06-28: 0.5 mg via INTRAVENOUS
  Filled 2023-06-28: qty 1

## 2023-06-28 MED ORDER — DEXTROSE 50 % IV SOLN
INTRAVENOUS | Status: AC
Start: 2023-06-28 — End: 2023-06-28
  Filled 2023-06-28: qty 50

## 2023-06-28 MED ORDER — MAGNESIUM SULFATE 2 GM/50ML IV SOLN
2.0000 g | Freq: Once | INTRAVENOUS | Status: AC
  Administered 2023-06-28: 2 g via INTRAVENOUS
  Filled 2023-06-28: qty 50

## 2023-06-28 NOTE — Plan of Care (Signed)
  Problem: Clinical Measurements: Goal: Ability to maintain clinical measurements within normal limits will improve Outcome: Progressing Goal: Will remain free from infection Outcome: Progressing Goal: Diagnostic test results will improve Outcome: Progressing Goal: Respiratory complications will improve Outcome: Progressing Goal: Cardiovascular complication will be avoided Outcome: Progressing   Problem: Activity: Goal: Risk for activity intolerance will decrease Outcome: Progressing   Problem: Elimination: Goal: Will not experience complications related to bowel motility Outcome: Progressing Goal: Will not experience complications related to urinary retention Outcome: Progressing   Problem: Safety: Goal: Ability to remain free from injury will improve Outcome: Progressing   Problem: Skin Integrity: Goal: Risk for impaired skin integrity will decrease Outcome: Progressing   Problem: Education: Goal: Knowledge of General Education information will improve Description: Including pain rating scale, medication(s)/side effects and non-pharmacologic comfort measures Outcome: Not Progressing   Problem: Health Behavior/Discharge Planning: Goal: Ability to manage health-related needs will improve Outcome: Not Progressing   Problem: Nutrition: Goal: Adequate nutrition will be maintained Outcome: Not Progressing   Problem: Coping: Goal: Level of anxiety will decrease Outcome: Not Progressing   Problem: Pain Managment: Goal: General experience of comfort will improve and/or be controlled Outcome: Not Progressing

## 2023-06-28 NOTE — Consult Note (Signed)
PHARMACY CONSULT NOTE - FOLLOW UP  Pharmacy Consult for Electrolyte Monitoring and Replacement   Recent Labs: Potassium (mmol/L)  Date Value  06/27/2023 3.4 (L)  11/22/2013 3.3 (L)   Magnesium (mg/dL)  Date Value  16/02/9603 2.0  11/22/2013 1.7 (L)   Calcium (mg/dL)  Date Value  54/01/8118 8.5 (L)   Calcium, Total (mg/dL)  Date Value  14/78/2956 8.7   Albumin (g/dL)  Date Value  21/30/8657 4.1  11/21/2013 3.8   Sodium (mmol/L)  Date Value  06/28/2023 136  02/27/2020 128 (L)  11/22/2013 138    Assessment: 72yo female who presented with a subdural hematoma with significant PMH of PAF, IBS, diverticulosis, DVT on Eliquis, HTN, IDA, HLD, OSA, anxiety and depression.  Due to ongoing brain compression and symptoms, surgical intervention was recommended. S/p procedure 2/15.   Diet: Feeding supplement (Ensure) 237 mL BID  Fluids: NA Medications: NA  Goal of Therapy:  WNL K = 3.8 Mg = 1.8  Phos = NA  Plan:  Replace with 20 mEq of Kcl x 1  Replace with Mg sulfate 2g IV x 1 No other replacement indicated at this time Continue to monitor with AM labs   Effie Shy, PharmD Pharmacy Resident  06/28/2023 6:19 AM

## 2023-06-28 NOTE — H&P (Addendum)
NAME:  [name], MRN:  [id number], DOB:  [birth year], LOS: 3 ADMISSION DATE:  06/25/2023, CONSULTATION DATE: 06/25/2023 REFERRING MD: Volanda Napoleon CHIEF COMPLAINT: Severe headaches  HPI  73 y.o female with significant PMH of PAF, IBS, diverticulosis, DVT on Eliquis, HTN, IDA, HLD, OSA, anxiety and depression who presented to the ED with chief complaints of severe headache.  Per ED reports, patient apparently fell on Monday hitting the back of her head and may have lost consciousness per her brother.  Patient complained of posterior headaches for about 2 to 3 days but refused to seek care.  She went out to lunch today with friends and began complaining of severe headache.  EMS was called and patient transported to the ED.   ED Course: Initial vital signs showed HR of 60 beats/minute, BP 138/70 mm Hg, the RR 18 breaths/minute, and the oxygen saturation 100% on RA and a temperature of 97.38F (36.4C). Patient was noted with change in mental status while waiting in triage.  Per triage nurse, patient suddenly stopped responding and was drooling at the corner of her mouth with some stiffening of her hands and left sided gaze.  She was given 1 mg Ativan IV and immediately taken to a room where stat head CT obtained.    Pertinent Labs/Diagnostics Findings: Unremarkable CMP WBC: 6.4K/L, Hgb/Hct: 6.1/18.3 Plts: 143 CTH> large left subdural hematoma with mass effect on the left lateral ventricles   Given finding as above, neurosurgery was consulted and patient taken emergently to the OR for decompression.  Patient transferred to the ICU postprocedure and PCCM consulted.  Past Medical History  PAF, IBS, diverticulosis, DVT on Eliquis, HTN, IDA, HLD, OSA, anxiety and depression  Significant Hospital Events   2/14: Admitted with traumatic subdural hematoma s/p emergent crani for hematoma evacuation.  Extubated and transferred to the ICU.  PCCM consulted 2/15:POD# 1 Agitated overnight but  able to follow commands 2/16: POD# 2 Patient c/o headache worsening in frequency, intensity and severity. Repeat CTH no evidence of recurrent SDH or stroke. Drain removed, cont with speech difficulty. MRI Brain, EEG ordered 2/17: POD# 3. No significant event overnight. MRI Brain neg. EEG pending  Consults:  PCCM  Procedures:  2/14:Left-sided craniotomy for hematoma evacuation  Significant Diagnostic Tests:  2/14: Chest Xray> IMPRESSION: No active disease.  2/14: Noncontrast CT head> IMPRESSION: 1. Large left cerebral convexity subdural hematoma measuring up to 1.1 cm with a 1.2 cm rightward midline shift as well as early uncal herniation. 2. Mass effect on the left lateral ventricular system with asymmetric enlargement of the left temporal horn, which can be seen in the setting of early ventricular entrapment. 3. No acute cervical spine fracture.  2/16: MRI Brain IMPRESSION: 1. Post-operative changes from prior left subdural hematoma evacuation. The residual subdural hematoma is stable in size as compared to the head CT performed earlier today. As before, the largest component is located along the posterior falx, measuring 12 mm in thickness. 2. A thin subdural collection (likely hematoma) is also present along the mid and anterior falx, and left cerebral convexity (measuring up to 3 mm in thickness). 3. Mild chronic small vessel ischemic changes within the cerebral white matter and pons. 4. Few chronic microhemorrhages within the left parietal and occipital lobes. Interim History / Subjective:    See significant events  Micro Data:  None  Antimicrobials:  None  OBJECTIVE  Blood pressure (!) 106/42, pulse 72, temperature 98.8 F (37.1 C), temperature source Bladder, resp. rate  16, weight 67.4 kg, SpO2 96%.      Intake/Output Summary (Last 24 hours) at 06/28/2023 0417 Last data filed at 06/28/2023 0400 Gross per 24 hour  Intake 1907.44 ml  Output 1520 ml  Net  387.44 ml   Filed Weights   06/26/23 0600 06/28/23 0400  Weight: 70.3 kg 67.4 kg    Physical Examination  GENERAL: 73 year-old critically ill patient lying in the bed restless  EYES: PEERLA. No scleral icterus. Extraocular muscles intact.  HEENT: s/p crani, dressing intact, Left drain removed. normocephalic. Oropharynx and nasopharynx clear.  NECK:  No JVD, supple  LUNGS: Normal breath sounds bilaterally.  No use of accessory muscles of respiration.  CARDIOVASCULAR: S1, S2 normal. No murmurs, rubs, or gallops.  ABDOMEN: Soft, NTND EXTREMITIES: No swelling or erythema. Pulses palpable distally. NEUROLOGIC: The patient is confused. Speech dysarthric. No focal neurological deficit appreciated. Cranial nerves are intact.  SKIN: No obvious rash, lesion, or ulcer. Warm to touch Labs/imaging that I havepersonally reviewed  (right click and "Reselect all SmartList Selections" daily)     Labs   CBC: Recent Labs  Lab 06/25/23 1333 06/25/23 2331 06/26/23 0615 06/27/23 0244 06/28/23 0320  WBC 6.4 13.9* 12.0* 11.7* 11.2*  NEUTROABS 2.9  --   --   --   --   HGB 6.1* 12.9 12.3 12.4 12.3  HCT 18.3* 36.5 35.3* 36.4 34.4*  MCV 94.8 87.5 88.5 89.7 88.4  PLT 143* 197 180 172 206   Basic Metabolic Panel: Recent Labs  Lab 06/25/23 1451 06/25/23 2048 06/26/23 0615 06/26/23 1507 06/27/23 0244 06/27/23 0819 06/27/23 1436 06/27/23 2015 06/28/23 0320  NA 138   < > 137   < > 136 135 134* 135 136  K 4.1  --  3.6  --  3.4*  --   --   --   --   CL 100  --  106  --  102  --   --   --   --   CO2 27  --  26  --  22  --   --   --   --   GLUCOSE 103*  --  103*  --  87  --   --   --   --   BUN 10  --  10  --  11  --   --   --   --   CREATININE 0.75  --  0.67  --  0.63  --   --   --   --   CALCIUM 9.4  --  8.6*  --  8.5*  --   --   --   --   MG  --   --  1.7  --  2.0  --   --   --   --    < > = values in this interval not displayed.   GFR: Estimated Creatinine Clearance: 55.8 mL/min (by C-G  formula based on SCr of 0.63 mg/dL). Recent Labs  Lab 06/25/23 1333 06/25/23 1452 06/25/23 2331 06/26/23 0615 06/27/23 0244 06/28/23 0320  WBC 6.4  --  13.9* 12.0* 11.7* 11.2*  LATICACIDVEN 1.2 1.0  --   --   --   --     Liver Function Tests: Recent Labs  Lab 06/25/23 1451  AST 22  ALT 21  ALKPHOS 94  BILITOT 0.9  PROT 6.9  ALBUMIN 4.1   No results for input(s): "LIPASE", "AMYLASE" in the last 168 hours. No results for  input(s): "AMMONIA" in the last 168 hours.  ABG    Component Value Date/Time   PHART 7.41 03/01/2022 0256   PCO2ART 38 03/01/2022 0256   PO2ART 71 (L) 03/01/2022 0256   HCO3 24.1 03/01/2022 0256   ACIDBASEDEF 0.3 03/01/2022 0256   O2SAT 98 03/01/2022 0256     Coagulation Profile: Recent Labs  Lab 06/25/23 1451  INR 1.4*   Cardiac Enzymes: No results for input(s): "CKTOTAL", "CKMB", "CKMBINDEX", "TROPONINI" in the last 168 hours.  HbA1C: No results found for: "HGBA1C"  CBG: No results for input(s): "GLUCAP" in the last 168 hours.  Review of Systems:   Unable to be obtained secondary to the patient's intubated and sedated status.   Past Medical History  She,  has a past medical history of Bradycardia, Chest pain, Constipation, Depression, Diverticulitis, Diverticulosis of colon, DOE (dyspnea on exertion), DVT (deep venous thrombosis) (HCC), Esophageal stricture, Hyperlipemia, Hypertension, IBS (irritable bowel syndrome), and Sleep apnea.   Surgical History    Past Surgical History:  Procedure Laterality Date   APPENDECTOMY     BLOOD CLOT  2004   RIGHT LEG   CHOLECYSTECTOMY     COLON SURGERY     GALLBLADDER SURGERY  2003   NISSEN FUNDOPLICATION     PLANTAR FASCIA SURGERY  2007   sigmoid colectomy  10/21/2010   pelvic anastomosis   VAGINAL HYSTERECTOMY     Social History   reports that she has never smoked. She has never used smokeless tobacco. She reports that she does not drink alcohol and does not use drugs.   Family History    Her family history includes Brain cancer in her father; Breast cancer in her cousin; Colitis in her sister; Colon polyps in her sister; Crohn's disease in her sister; Diabetes in her sister; Heart disease in her mother; Irritable bowel syndrome in her brother. There is no history of Colon cancer.   Allergies Allergies  Allergen Reactions   Dicyclomine Other (See Comments)    REACTION: Choking   Lisinopril Other (See Comments)    REACTION: Choking   Losartan Shortness Of Breath   Ace Inhibitors Other (See Comments)    Unknown to patient   Penicillins Itching and Rash    Home Medications  Prior to Admission medications   Medication Sig Start Date End Date Taking? Authorizing Provider  albuterol (PROVENTIL HFA;VENTOLIN HFA) 108 (90 Base) MCG/ACT inhaler Inhale 2 puffs into the lungs every 6 (six) hours as needed for wheezing or shortness of breath. 11/16/17   Alford Highland, MD  apixaban (ELIQUIS) 5 MG TABS tablet Take 1 tablet (5 mg total) by mouth 2 (two) times daily. 11/30/22 12/30/22  Phineas Semen, MD  aspirin 81 MG tablet Take 1 tablet (81 mg total) by mouth daily. 10/27/17   [facility], Roe Rutherford, PA  atorvastatin (LIPITOR) 20 MG tablet Take 20 mg by mouth daily.    [provider]  citalopram (CELEXA) 20 MG tablet Take 20 mg by mouth daily. 04/05/19   [provider]  diltiazem (CARDIZEM) 30 MG tablet Take 1 tablet (30 mg total) by mouth 2 (two) times daily as needed (sustained HR>120 with symptoms). 11/30/22 11/30/23  Shaune Pollack, MD  famotidine (PEPCID) 20 MG tablet Take 20 mg by mouth at bedtime.  03/06/19   [provider]  hydrALAZINE (APRESOLINE) 25 MG tablet Take 25 mg by mouth in the morning and at bedtime.    [provider]  isosorbide mononitrate (IMDUR) 30 MG 24 hr tablet  Take 30 mg by mouth daily. 02/23/22   [provider]  loperamide (IMODIUM) 2 MG capsule Take 2 capsules (4 mg total) by mouth every 8 (eight) hours as needed  for diarrhea or loose stools. 08/09/20   Alford Highland, MD  LORazepam (ATIVAN) 0.5 MG tablet Take 0.5 mg by mouth every 8 (eight) hours as needed. For anxiety    [provider]  metoCLOPramide (REGLAN) 10 MG tablet Take 1 tablet (10 mg total) by mouth every 8 (eight) hours as needed for nausea. 01/06/23 02/05/23  Trinna Post, MD  metoprolol succinate (TOPROL-XL) 25 MG 24 hr tablet TAKE 1 TABLET BY MOUTH TWICE DAILY FOR 90 DAYS 02/15/18   [provider]  spironolactone (ALDACTONE) 25 MG tablet Take 1 tablet (25 mg total) by mouth daily. 04/20/19 02/28/22  Jodelle Gross, NP  Scheduled Meds:  Chlorhexidine Gluconate Cloth  6 each Topical Daily   feeding supplement  237 mL Oral BID BM   heparin injection (subcutaneous)  5,000 Units Subcutaneous Q12H   LORazepam  0.5 mg Intravenous Once   Continuous Infusions:  acetaminophen Stopped (06/28/23 0216)   famotidine (PEPCID) IV Stopped (06/27/23 2244)   levETIRAcetam Stopped (06/27/23 2202)   niCARDipine Stopped (06/28/23 0200)   PRN Meds:.docusate sodium, fentaNYL (SUBLIMAZE) injection, labetalol, LORazepam, polyethylene glycol  Active Hospital Problem list   See systems below  Assessment & Plan:  #Traumatic Subdural Hematoma with Left -to Right midline shift S/p Left-sided craniotomy for evacuation of subdural hematoma POD#3 -Goal SBP < 160 using prn Labetolol or Nicardipine Gtt if needed -HOB >= 30 degrees with aspiration precautions -Frequent neuro checks per protocol. repeat stat head CT and MRI Brain with no acute changes -Goal Na>145, INR goal <1.4, daily coags.  -Drain removed -Pain control -No antiplatelet or anticoagulant agents at this time until cleared by Neurosurgery -PT/OT/Speech -Neurosurgery following  #Altered Mental Status #Seizure Activity likely in the setting for tSDH -Repeat imaging as above stable -Seizure precautions -Seizure prophylaxis with Keppra, received loading dose in the ED will  continue 500mg  BID -Lorazepam/Versed PRN for breakthrough seizure -EEG and Neurology consult if with recurrent seizure post crani  #Acute Blood Loss Anemia due to tSDH Hx of IDA Hgb 6.1 on presentation s/p 2units PRBCs -Maintain MAP >65 -Monitor for S/Sx of bleeding -Trend CBC (H&H q6h) -SCD's for VTE Prophylaxis (chemical ppx contraindicated) -Transfuse for Hgb <7  (has received 2 units PRBC's so far)   #Paroxysmal AF rate controlled #HTN #HLD Hx of LVH -Hold diltiazem, spironolactone, Imdur and metoprolol for now while on Nicardipine gtt -Hold Eliquis -Continue Atorvastatin and Lipitor once able to take po  #Anxiety and Depression -continue home Citalopram  and Lorazepam o   Best practice:  Diet:  NPO Pain/Anxiety/Delirium protocol (if indicated): No VAP protocol (if indicated): Not indicated DVT prophylaxis: Contraindicated GI prophylaxis: H2B Glucose control:  SSI No Central venous access:  N/A Arterial line:  Yes, and it is still needed Foley:  Yes, and it is still needed Mobility:  bed rest  PT consulted: N/A Last date of multidisciplinary goals of care discussion []  Code Status:  full code Disposition: ICU   = Goals of Care = Code Status Order: full  Primary Emergency ContactRollene Fare, Home Phone: [phone] Wishes to pursue full aggressive treatment and intervention options, including CPR and intubation.  Critical care time: 45 minutes      Webb Silversmith DNP, CCRN, FNP-C, AGACNP-BC Acute Care & Family Nurse Practitioner [facility]  PCCM on call pager [phone]

## 2023-06-28 NOTE — Progress Notes (Signed)
 Eeg done

## 2023-06-28 NOTE — Procedures (Signed)
Patient Name: [name]  MRN: [id number]  Epilepsy Attending: Charlsie Quest  Referring Physician/Provider: Venetia Night, MD  Date: 06/28/2023 Duration: 27.06 mins  Patient history: 72yo F with subdural hematoma s/p craniotomy for evacuation on 06/25/2023. EEG to evaluate for seizure    Level of alertness: Awake, asleep  AEDs during EEG study: LEV  Technical aspects: This EEG study was done with scalp electrodes positioned according to the 10-20 International system of electrode placement. Electrical activity was reviewed with band pass filter of 1-70Hz , sensitivity of 7 uV/mm, display speed of 37mm/sec with a 60Hz  notched filter applied as appropriate. EEG data were recorded continuously and digitally stored.  Video monitoring was available and reviewed as appropriate.  Description: The posterior dominant rhythm consists of 8-9 Hz activity of moderate voltage (25-35 uV) seen predominantly in posterior head regions, symmetric and reactive to eye opening and eye closing. Sleep was characterized by vertex waves, sleep spindles (12 to 14 Hz), maximal frontocentral region. EEG showed intermittent 3 to 4 Hz delta slowing in left temporal region. Hyperventilation and photic stimulation were not performed.     ABNORMALITY - Intermittent slow, left temporal region  IMPRESSION: This study is suggestive of cortical dysfunction arising from left temporal region likely secondary to underlying sub dural hematoma/ cranioplasty. No seizures or epileptiform discharges were seen throughout the recording.  Elnita Surprenant Annabelle Harman

## 2023-06-28 NOTE — Plan of Care (Signed)
D/C ART LINE D/C FOLEY Follow up NEURO-SX CONSULT Consider SD status PRN PAIN MEDS D/C oxygen

## 2023-06-28 NOTE — Progress Notes (Signed)
Speech Language Pathology Treatment: Dysphagia  Patient Details Name: [name] MRN: [id number] DOB: 02-10-1951 Today's Date: 06/28/2023 Time: 7253-6644 SLP Time Calculation (min) (ACUTE ONLY): 25 min  Assessment / Plan / Recommendation Clinical Impression  Pt seen for dysphagia intervention in the setting of continued fluctuating alertness and oral acceptance per NSG. Pt resting upon therapist approach- on room air with O2 saturations maintained at 96 and greater for duration of session. Min verbal cues increased alertness- pt opening eyes, smiling. Verbal perseveration noted, with response across questions of "no," family able to verify accuracy for responses. Minimal intake reported. Trials completed of thin liquid via straw and nectar thick (Magic Cup) via spoon. Pt demonstrating intermittent lethargy during trials requiring verbal/tactile cues to ensure completion of bite and readiness for continued trials. Pt demonstrating oral clearance with intermittent visual checks. Dry cough noted following trials of thin and nectar trials- family endorsing dry cough is patient's baseline related to long history with asthma- other indication of aspiration. Vitals stable t/o. Lethargy and fluctuating alertness continue to impede pt's readiness for trials of advanced solids.  Recommend continuation of Dys 1 (puree) and thin liquids. Full supervision/assist with meals. Facilitate pt holding cup to increase engagement in task. Aspiration precautions- slow rate, small bites, elevated HOB (during/after meals), and sustained alertness for duration.   Education provided to family regarding continued diet recommendation and continue hold on speech language eval in the setting of lethargy and continued fluctutations. SLP will continue to follow.    HPI HPI: 73 y.o female with significant PMH of PAF, IBS, diverticulosis, DVT on Eliquis, HTN, IDA, HLD, OSA, anxiety and depression who presented to the ED with chief  complaints of severe headache. Per ED reports, patient apparently fell on Monday hitting the back of her head and may have lost consciousness per her brother. Patient complained of posterior headaches for about 2 to 3 days but refused to seek care. She went out to lunch today with friends and began complaining of severe headache. EMS was called and patient transported to the ED. Admitted 2/14 with traumatic subdural hematoma s/p emergent crani for hematoma evacuation. Extubated and transferred to the ICU. Pt now on 2L O2 and NPO. CT Head 2/15: Resolved midline shift after subdural hematoma evacuation. No complicating features. CXR 2/14: No active disease. GI History: Esophageal stricture, per chart review last EGD with dilation completed on 01/25/23.  This Admit: pt tx'd for subdural hematoma s/p craniotomy for evacuation on 06/25/2023.   POD2: She had agitation overnight.  Continues to have difficulty with speech.  POD1: She is agitated but has improved compared to pre-op.  Repeat Head CT on 06/27/23: No significant reaccumulation of the recently evaluated  subdural hematoma on the left, on a few coronal slices it is  possible there is trace increase in blood products along the  craniotomy flap. Dominant residual is still left parafalcine at the  frontal parietal junction measuring up to 12 mm in thickness. Trace  midline shift without entrapment, stable on coronal reformats. No  evidence of infarct.      SLP Plan  Continue with current plan of care      Recommendations for follow up therapy are one component of a multi-disciplinary discharge planning process, led by the attending physician.  Recommendations may be updated based on patient status, additional functional criteria and insurance authorization.    Recommendations  Diet recommendations: Dysphagia 1 (puree);Thin liquid Liquids provided via: Cup;Straw Medication Administration: Crushed with puree Supervision: Staff to  assist with self  feeding;Full supervision/cueing for compensatory strategies Compensations: Minimize environmental distractions;Slow rate;Small sips/bites;Follow solids with liquid Postural Changes and/or Swallow Maneuvers: Seated upright 90 degrees;Upright 30-60 min after meal                  Oral care BID;Oral care before and after PO;Staff/trained caregiver to provide oral care   Frequent or constant Supervision/Assistance Dysphagia, oropharyngeal phase (R13.12);Dysphagia, pharyngoesophageal phase (R13.14) (baseline esophageal)     Continue with current plan of care   Melissa Leveon Pelzer Clapp, MS, CCC-SLP Speech Language Pathologist Rehab Services; [facility] [phone] (ascom)    [name]  06/28/2023, 12:48 PM

## 2023-06-28 NOTE — Discharge Instructions (Signed)
NEUROSURGERY DISCHARGE INSTRUCTIONS  Admission diagnosis: Seizure (HCC) [R56.9] Subdural hematoma (HCC) [S06.5XAA]  Operative procedure: Craniotomy for evacuation of subdural hematoma   What to do after you leave the hospital:  Recommended diet: regular diet. Increase protein intake to promote wound healing.  Recommended activity: activity as tolerated. No driving for 6 weeks.You should walk multiple times per day  Special Instructions  No straining, no heavy lifting > 10lbs x 4 weeks.  Keep incision area clean and dry. May shower in 2 days. No baths or pools for 6 weeks.  Please remove dressing tomorrow, no need to apply a bandage afterwards  You have sutures or staples that will be removed in clinic  Please take pain medications as directed. Take a stool softener if on pain medications   Please Report any of the following: Nausea or Vomiting, Temperature is greater than 101.14F (38.1C) degrees, Dizziness, Abdominal Pain, Difficulty Breathing or Shortness of Breath, Inability to Eat, drink Fluids, or Take medications, Bleeding, swelling, or drainage from surgical incision sites, New numbness or weakness, and Bowel or bladder dysfunction to the neurosurgeon on call. How to contact us:  If you have any questions/concerns before or after surgery, you can reach [place] at [phone], or you can send a mychart message. We can be reached by phone or mychart 8am-4pm, Monday-Friday.  *Please note: Calls after 4pm are forwarded to a third party answering service. Mychart messages are not routinely monitored during evenings, weekends, and holidays. Please call our office to contact the answering service for urgent concerns during non-business hours.   Additional Follow up appointments Please follow up with Manning Charity PA-C as scheduled in 2-3 weeks   Please see below for scheduled appointments:  Future Appointments  Date Time Provider Department Center  07/08/2023 10:00 AM Susanne Borders,  PA [facility] None  08/05/2023 10:30 AM Venetia Night, MD [facility] None  09/14/2023 11:30 AM Susanne Borders, PA [facility] None

## 2023-06-28 NOTE — Progress Notes (Signed)
Subjective: Awake and speaking with family. LUE flaccid. Occasional babbling speech output, but also answers several questions correctly.   Objective: Current vital signs: BP (!) 139/56   Pulse 89   Temp 99.4 F (37.4 C) (Axillary)   Resp 17   Wt 67.4 kg   SpO2 97%   BMI 28.08 kg/m  Vital signs in last 24 hours: Temp:  [98.2 F (36.8 C)-100.9 F (38.3 C)] 99.4 F (37.4 C) (02/17 2000) Pulse Rate:  [60-113] 89 (02/17 1500) Resp:  [13-22] 17 (02/17 1823) BP: (106-139)/(42-61) 139/56 (02/17 1823) SpO2:  [89 %-100 %] 97 % (02/17 2000) Arterial Line BP: (96-172)/(38-81) 141/51 (02/17 1500) Weight:  [67.4 kg] 67.4 kg (02/17 0400)  Intake/Output from previous day: 02/16 0701 - 02/17 0700 In: 1907.4 [I.V.:615.9; IV Piggyback:1291.5] Out: 1320 [Urine:1320] Intake/Output this shift: No intake/output data recorded. Nutritional status:  Diet Order             DIET - DYS 1 Room service appropriate? Yes with Assist; Fluid consistency: Thin  Diet effective now                  HEENT: Postoperative findings noted.  Lungs: Respirations unlabored Ext: Warm and well perfused  Neurologic Exam: Ment: Awake and alert. Partially oriented. Able to follow most simple commands. Answers several simple questions correctly, but at times with babbling speech output. Mild dysarthria. CN: Pupils 3 mm ERRL, EOMI, face symmetric, hearing intact to voice. Phonation intact. Head is midline.  Motor: RUE no movement with flaccid tone.  RLE 4+/5 LUE 4+/5 LLE 5/5 Sensory: Intact to FT x 4 Coordination: No ataxia with FNF Reflexes: Brisk throughout, with 4+ left patellar (clonus) Gait: Deferred   Lab Results: Results for orders placed or performed during the hospital encounter of 06/25/23 (from the past 48 hours)  CBC     Status: Abnormal   Collection Time: 06/27/23  2:44 AM  Result Value Ref Range   WBC 11.7 (H) 4.0 - 10.5 K/uL   RBC 4.06 3.87 - 5.11 MIL/uL   Hemoglobin 12.4 12.0 - 15.0  g/dL   HCT 16.1 09.6 - 04.5 %   MCV 89.7 80.0 - 100.0 fL   MCH 30.5 26.0 - 34.0 pg   MCHC 34.1 30.0 - 36.0 g/dL   RDW 40.9 81.1 - 91.4 %   Platelets 172 150 - 400 K/uL   nRBC 0.0 0.0 - 0.2 %    Comment: Performed at [facility], [address], [place], [place] [address]  Basic metabolic panel     Status: Abnormal   Collection Time: 06/27/23  2:44 AM  Result Value Ref Range   Sodium 136 135 - 145 mmol/L   Potassium 3.4 (L) 3.5 - 5.1 mmol/L   Chloride 102 98 - 111 mmol/L   CO2 22 22 - 32 mmol/L   Glucose, Bld 87 70 - 99 mg/dL    Comment: Glucose reference range applies only to samples taken after fasting for at least 8 hours.   BUN 11 8 - 23 mg/dL   Creatinine, Ser 6.21 0.44 - 1.00 mg/dL   Calcium 8.5 (L) 8.9 - 10.3 mg/dL   GFR, Estimated >30 >86 mL/min    Comment: (NOTE) Calculated using the CKD-EPI Creatinine Equation (2021)    Anion gap 12 5 - 15    Comment: Performed at [facility], [address], [place], [place] [address]  Magnesium     Status: None   Collection Time: 06/27/23  2:44 AM  Result Value Ref Range   Magnesium 2.0 1.7 - 2.4 mg/dL    Comment: Performed at [facility], [address] [place], [place] [address]  Sodium     Status: None   Collection Time: 06/27/23  8:19 AM  Result Value Ref Range   Sodium 135 135 - 145 mmol/L    Comment: Performed at [facility], [address] [place], [place] [address]  Urinalysis, Routine w reflex microscopic -Urine, Catheterized; Indwelling urinary catheter     Status: Abnormal   Collection Time: 06/27/23 10:31 AM  Result Value Ref Range   Color, Urine YELLOW (A) YELLOW   APPearance CLEAR (A) CLEAR   Specific Gravity, Urine 1.017 1.005 - 1.030   pH 5.0 5.0 - 8.0   Glucose, UA NEGATIVE NEGATIVE mg/dL   Hgb urine dipstick LARGE (A) NEGATIVE   Bilirubin Urine NEGATIVE NEGATIVE   Ketones, ur 20 (A) NEGATIVE mg/dL   Protein, ur NEGATIVE NEGATIVE mg/dL   Nitrite NEGATIVE  NEGATIVE   Leukocytes,Ua NEGATIVE NEGATIVE   RBC / HPF >50 0 - 5 RBC/hpf   WBC, UA 6-10 0 - 5 WBC/hpf   Bacteria, UA NONE SEEN NONE SEEN   Squamous Epithelial / HPF 0 0 - 5 /HPF   Mucus PRESENT    Hyaline Casts, UA PRESENT     Comment: Performed at [facility], [address] [place], [place] [address]  Sodium     Status: Abnormal   Collection Time: 06/27/23  2:36 PM  Result Value Ref Range   Sodium 134 (L) 135 - 145 mmol/L    Comment: Performed at [facility], [address] [place], [place] [address]  Sodium     Status: None   Collection Time: 06/27/23  8:15 PM  Result Value Ref Range   Sodium 135 135 - 145 mmol/L    Comment: Performed at [facility], [address] [place], [place] [address]  Sodium     Status: None   Collection Time: 06/28/23  3:20 AM  Result Value Ref Range   Sodium 136 135 - 145 mmol/L    Comment: Performed at [facility], [address] [place], [place] [address]  CBC     Status: Abnormal   Collection Time: 06/28/23  3:20 AM  Result Value Ref Range   WBC 11.2 (H) 4.0 - 10.5 K/uL   RBC 3.89 3.87 - 5.11 MIL/uL   Hemoglobin 12.3 12.0 - 15.0 g/dL   HCT 52.8 (L) 41.3 - 24.4 %   MCV 88.4 80.0 - 100.0 fL   MCH 31.6 26.0 - 34.0 pg   MCHC 35.8 30.0 - 36.0 g/dL   RDW 01.0 27.2 - 53.6 %   Platelets 206 150 - 400 K/uL   nRBC 0.0 0.0 - 0.2 %    Comment: Performed at [facility], [address] [place], [place] [address]  Basic metabolic panel     Status: Abnormal   Collection Time: 06/28/23  3:20 AM  Result Value Ref Range   Sodium 134 (L) 135 - 145 mmol/L   Potassium 3.8 3.5 - 5.1 mmol/L   Chloride 102 98 - 111 mmol/L   CO2 22 22 - 32 mmol/L   Glucose, Bld 71 70 - 99 mg/dL    Comment: Glucose reference range applies only to samples taken after fasting for at least 8 hours.   BUN 11 8 - 23 mg/dL   Creatinine, Ser 4.74 0.44 -  1.00 mg/dL   Calcium 8.1 (L) 8.9 - 10.3 mg/dL   GFR, Estimated >29 >52  mL/min    Comment: (NOTE) Calculated using the CKD-EPI Creatinine Equation (2021)    Anion gap 10 5 - 15    Comment: Performed at [facility], [address] [place], [place] [address]  Magnesium     Status: None   Collection Time: 06/28/23  3:20 AM  Result Value Ref Range   Magnesium 1.8 1.7 - 2.4 mg/dL    Comment: Performed at [facility], [address] [place], [place] [address]  Sodium     Status: None   Collection Time: 06/28/23  8:15 AM  Result Value Ref Range   Sodium 136 135 - 145 mmol/L    Comment: Performed at [facility], [address] [place], [place] [address]  Glucose, capillary     Status: Abnormal   Collection Time: 06/28/23 10:48 AM  Result Value Ref Range   Glucose-Capillary 69 (L) 70 - 99 mg/dL    Comment: Glucose reference range applies only to samples taken after fasting for at least 8 hours.  Glucose, capillary     Status: Abnormal   Collection Time: 06/28/23 11:59 AM  Result Value Ref Range   Glucose-Capillary 218 (H) 70 - 99 mg/dL    Comment: Glucose reference range applies only to samples taken after fasting for at least 8 hours.    No results found for this or any previous visit (from the past 240 hours).  Lipid Panel No results for input(s): "CHOL", "TRIG", "HDL", "CHOLHDL", "VLDL", "LDLCALC" in the last 72 hours.  Studies/Results: EEG adult Result Date: 06/28/2023 Charlsie Quest, MD     06/28/2023  2:50 PM Patient Name: [name] MRN: [id number] Epilepsy Attending: Charlsie Quest Referring Physician/Provider: Venetia Night, MD Date: 06/28/2023 Duration: 27.06 mins Patient history: 72yo F with subdural hematoma s/p craniotomy for evacuation on 06/25/2023. EEG to evaluate for seizure  Level of alertness: Awake, asleep AEDs during EEG study: LEV Technical aspects: This EEG study was done with scalp electrodes positioned according to the 10-20 International system of electrode placement. Electrical activity  was reviewed with band pass filter of 1-70Hz , sensitivity of 7 uV/mm, display speed of 76mm/sec with a 60Hz  notched filter applied as appropriate. EEG data were recorded continuously and digitally stored.  Video monitoring was available and reviewed as appropriate. Description: The posterior dominant rhythm consists of 8-9 Hz activity of moderate voltage (25-35 uV) seen predominantly in posterior head regions, symmetric and reactive to eye opening and eye closing. Sleep was characterized by vertex waves, sleep spindles (12 to 14 Hz), maximal frontocentral region. EEG showed intermittent 3 to 4 Hz delta slowing in left temporal region. Hyperventilation and photic stimulation were not performed.   ABNORMALITY - Intermittent slow, left temporal region IMPRESSION: This study is suggestive of cortical dysfunction arising from left temporal region likely secondary to underlying sub dural hematoma/ cranioplasty. No seizures or epileptiform discharges were seen throughout the recording. Charlsie Quest   MR BRAIN W WO CONTRAST Result Date: 06/27/2023 CLINICAL DATA:  Provided history: Mental status change, unknown cause. Follow-up subdural bleed. EXAM: MRI HEAD WITHOUT AND WITH CONTRAST TECHNIQUE: Multiplanar, multiecho pulse sequences of the brain and surrounding structures were obtained without and with intravenous contrast. CONTRAST:  7mL GADAVIST GADOBUTROL 1 MMOL/ML IV SOLN COMPARISON:  Prior head CT examinations 06/27/2023 and earlier. Brain MRI 03/31/2022. FINDINGS: Brain: Postoperative changes from prior left-sided craniotomy and left  subdural hematoma evacuation. The residual subdural hematoma has not significantly changed in size as compared to the head CT performed earlier today. As before, the largest component is located along the posterior falx, measuring 12 mm in thickness (for instance as seen on series 9, image 38). A thin subdural collection (likely hematoma) is also present along the mid and anterior  falx and left cerebral convexity, measuring up to 3 mm in thickness. Multifocal T2 FLAIR hyperintense signal abnormality within the cerebral white matter and pons, nonspecific but compatible with mild chronic small vessel ischemic disease. There are a few chronic microhemorrhages within the left parietal and occipital lobes. Partially empty sella turcica. There is no acute infarct. No evidence of an intracranial mass. No extra-axial fluid collection. No midline shift. No pathologic parenchymal enhancement. Vascular: Maintained flow voids within the proximal large arterial vessels. Skull and upper cervical spine: Left-sided cranioplasty with overlying scalp edema/fluid. Sinuses/Orbits: No mass or acute finding within the imaged orbits. No significant paranasal sinus disease. Other: Trace fluid within left mastoid air cells. IMPRESSION: 1. Post-operative changes from prior left subdural hematoma evacuation. The residual subdural hematoma is stable in size as compared to the head CT performed earlier today. As before, the largest component is located along the posterior falx, measuring 12 mm in thickness. 2. A thin subdural collection (likely hematoma) is also present along the mid and anterior falx, and left cerebral convexity (measuring up to 3 mm in thickness). 3. Mild chronic small vessel ischemic changes within the cerebral white matter and pons. 4. Few chronic microhemorrhages within the left parietal and occipital lobes. Electronically Signed   By: Jackey Loge D.O.   On: 06/27/2023 17:33   CT HEAD WO CONTRAST ( ) Result Date: 06/27/2023 CLINICAL DATA:  Headache with increasing frequency or severity. EXAM: CT HEAD WITHOUT CONTRAST TECHNIQUE: Contiguous axial images were obtained from the base of the skull through the vertex without intravenous contrast. RADIATION DOSE REDUCTION: This exam was performed according to the departmental dose-optimization program which includes automated exposure control,  adjustment of the mA and/or kV according to patient size and/or use of iterative reconstruction technique. COMPARISON:  Yesterday FINDINGS: Brain: No significant reaccumulation of the recently evaluated subdural hematoma on the left, on a few coronal slices it is possible there is trace increase in blood products along the craniotomy flap. Dominant residual is still left parafalcine at the frontal parietal junction measuring up to 12 mm in thickness. Trace midline shift without entrapment, stable on coronal reformats. No evidence of infarct Vascular: No hyperdense vessel or unexpected calcification. Skull: Unremarkable left-sided craniotomy site. Sinuses/Orbits: Negative IMPRESSION: Essentially stable postoperative head CT.  No new abnormality. Electronically Signed   By: Tiburcio Pea M.D.   On: 06/27/2023 04:11    Medications: Scheduled:  Chlorhexidine Gluconate Cloth  6 each Topical Daily   feeding supplement  237 mL Oral BID BM   heparin injection (subcutaneous)  5,000 Units Subcutaneous Q12H    HYDROmorphone (DILAUDID) injection  0.5 mg Intravenous Once   LORazepam  0.5 mg Intravenous Once    MRI brain from Sunday: Post-operative changes from prior left subdural hematoma evacuation. The residual subdural hematoma is stable in size as compared to the head CT performed earlier today. As before, the largest component is located along the posterior falx, measuring 12 mm in thickness. A thin subdural collection (likely hematoma) is also present along the mid and anterior falx, and left cerebral convexity (measuring up to 3 mm in thickness). Mild chronic  small vessel ischemic changes within the cerebral white matter and pons. Few chronic microhemorrhages within the left parietal and occipital lobes.    ASSESSMENT   73 yo woman presented to ED with 3-4 days of AMS and severe h/a and found to have large L subdural with 1.2cm MLS, early uncal herniation, and impending ventricular entrapment. Taken to OR  for craniotomy and hematoma evacuation. She had a possible seizure in ED. She is now on Keppra 500 mg bid.   - Exam today is slightly improved from documented neurological exam on Sunday - Repeat MRI brain on Sunday as noted above.  - EEG: Intermittent slow, left temporal region. This study is suggestive of cortical dysfunction arising from the left temporal region likely secondary to underlying subdural hematoma/ cranioplasty. No seizures or epileptiform discharges were seen throughout the recording.  RECOMMENDATIONS  - Family has been asked to capture video of abnormal movements if they occur so that neurology can review them - Continue Keppra at 500 mg BID. - Further management per Neurosurgery - Neurohospitalist service will sign off. Please call if there are additional questions.      LOS: 3 days   @Electronically  signed: Dr. Caryl Pina 06/28/2023  8:58 PM

## 2023-06-28 NOTE — Progress Notes (Signed)
Neurosurgery Progress Note  History: [name] is here for subdural hematoma s/p craniotomy for evacuation on 06/25/2023.  POD3: continues confusion and trouble with speech this morning POD2: She had agitation overnight.  Continues to have difficulty with speech. POD1: She is agitated but has improved compared to pre-op.  Physical Exam: Vitals:   06/28/23 0600 06/28/23 0700  BP:    Pulse: 74 62  Resp: 15 16  Temp: 98.2 F (36.8 C) 98.2 F (36.8 C)  SpO2: 99% 95%   AA Ox self. Unable to accurately recall place or time  CNI with  R facial droop  Strength:5/5 throughout left upper and lower extremity. RLE moving spontaneously  RUE 1/5   Data:  Other tests/results:  CT head 2/16  FINDINGS: Brain: No significant reaccumulation of the recently evaluated subdural hematoma on the left, on a few coronal slices it is possible there is trace increase in blood products along the craniotomy flap. Dominant residual is still left parafalcine at the frontal parietal junction measuring up to 12 mm in thickness. Trace midline shift without entrapment, stable on coronal reformats. No evidence of infarct   Vascular: No hyperdense vessel or unexpected calcification.   Skull: Unremarkable left-sided craniotomy site.   Sinuses/Orbits: Negative   IMPRESSION: Essentially stable postoperative head CT.  No new abnormality.     Electronically Signed   By: Tiburcio Pea M.D.   On: 06/27/2023 04:11  MRI brain 2/16 IMPRESSION: 1. Post-operative changes from prior left subdural hematoma evacuation. The residual subdural hematoma is stable in size as compared to the head CT performed earlier today. As before, the largest component is located along the posterior falx, measuring 12 mm in thickness. 2. A thin subdural collection (likely hematoma) is also present along the mid and anterior falx, and left cerebral convexity (measuring up to 3 mm in thickness). 3. Mild chronic small  vessel ischemic changes within the cerebral white matter and pons. 4. Few chronic microhemorrhages within the left parietal and occipital lobes.     Electronically Signed   By: Jackey Loge D.O.   On: 06/27/2023 17:33  Assessment/Plan:  [name] has shown some improvement after subdural hematoma evacuation but has continued speech abnormality and RUE paresis. MRI and head CT reassuring.   - mobilize - pain control - minimize narcotics - ok for DVT prophylaxis from neurosurgical standpoint  - PTOTST - ok to start - Drain removed 2/16 - Continue keppra - plan for EEG today. - ok to resume home Eliquis on POD 14.  - Will plan for repeat head CT around 4 weeks post-op. Pt may need an MMA embo in the future.   Manning Charity PA-C Department of Neurosurgery

## 2023-06-29 ENCOUNTER — Inpatient Hospital Stay: Payer: 59

## 2023-06-29 DIAGNOSIS — S065XAA Traumatic subdural hemorrhage with loss of consciousness status unknown, initial encounter: Secondary | ICD-10-CM | POA: Diagnosis not present

## 2023-06-29 LAB — BASIC METABOLIC PANEL
Anion gap: 9 (ref 5–15)
BUN: 11 mg/dL (ref 8–23)
CO2: 24 mmol/L (ref 22–32)
Calcium: 8.4 mg/dL — ABNORMAL LOW (ref 8.9–10.3)
Chloride: 102 mmol/L (ref 98–111)
Creatinine, Ser: 0.6 mg/dL (ref 0.44–1.00)
GFR, Estimated: >60 mL/min (ref >60)
Glucose, Bld: 94 mg/dL (ref 70–99)
Potassium: 3.4 mmol/L — ABNORMAL LOW (ref 3.5–5.1)
Sodium: 135 mmol/L (ref 135–145)

## 2023-06-29 LAB — CBC
HCT: 36.4 % (ref 36.0–46.0)
Hemoglobin: 12.3 g/dL (ref 12.0–15.0)
MCH: 30.4 pg (ref 26.0–34.0)
MCHC: 33.8 g/dL (ref 30.0–36.0)
MCV: 89.9 fL (ref 80.0–100.0)
Platelets: 197 10*3/uL (ref 150–400)
RBC: 4.05 MIL/uL (ref 3.87–5.11)
RDW: 13.9 % (ref 11.5–15.5)
WBC: 10 10*3/uL (ref 4.0–10.5)
nRBC: 0 % (ref 0.0–0.2)

## 2023-06-29 LAB — GLUCOSE, CAPILLARY
Glucose-Capillary: 119 mg/dL — ABNORMAL HIGH (ref 70–99)
Glucose-Capillary: 141 mg/dL — ABNORMAL HIGH (ref 70–99)
Glucose-Capillary: 66 mg/dL — ABNORMAL LOW (ref 70–99)
Glucose-Capillary: 70 mg/dL (ref 70–99)
Glucose-Capillary: 71 mg/dL (ref 70–99)

## 2023-06-29 LAB — PHOSPHORUS: Phosphorus: 2.4 mg/dL — ABNORMAL LOW (ref 2.5–4.6)

## 2023-06-29 LAB — MAGNESIUM: Magnesium: 1.8 mg/dL (ref 1.7–2.4)

## 2023-06-29 LAB — VITAMIN B12: Vitamin B-12: 404 pg/mL (ref 180–914)

## 2023-06-29 LAB — VITAMIN D 25 HYDROXY (VIT D DEFICIENCY, FRACTURES): Vit D, 25-Hydroxy: 18.58 ng/mL — ABNORMAL LOW (ref 30–100)

## 2023-06-29 MED ORDER — FREE WATER
50.0000 mL | Status: DC
  Administered 2023-06-29 – 2023-07-04 (×25): 50 mL

## 2023-06-29 MED ORDER — METOPROLOL TARTRATE 25 MG PO TABS: 25.0000 mg | ORAL_TABLET | Freq: Two times a day (BID) | ORAL | Status: DC

## 2023-06-29 MED ORDER — POTASSIUM CHLORIDE 10 MEQ/100ML IV SOLN
10.0000 meq | INTRAVENOUS | Status: AC
  Administered 2023-06-29 (×4): 10 meq via INTRAVENOUS
  Filled 2023-06-29 (×4): qty 100

## 2023-06-29 MED ORDER — ORAL CARE MOUTH RINSE
15.0000 mL | OROMUCOSAL | Status: DC
  Administered 2023-06-29 – 2023-07-12 (×49): 15 mL via OROMUCOSAL
  Filled 2023-06-29 (×2): qty 15

## 2023-06-29 MED ORDER — DEXTROSE 50 % IV SOLN
INTRAVENOUS | Status: AC
Start: 2023-06-29 — End: 2023-06-29
  Filled 2023-06-29: qty 50

## 2023-06-29 MED ORDER — IPRATROPIUM-ALBUTEROL 0.5-2.5 (3) MG/3ML IN SOLN: 3.0000 mL | Freq: Four times a day (QID) | RESPIRATORY_TRACT | Status: DC | PRN

## 2023-06-29 MED ORDER — TRAZODONE HCL 50 MG PO TABS
50.0000 mg | ORAL_TABLET | Freq: Every evening | ORAL | Status: DC | PRN
  Administered 2023-06-29 – 2023-07-02 (×3): 50 mg via ORAL
  Filled 2023-06-29 (×3): qty 1

## 2023-06-29 MED ORDER — DEXTROSE 50 % IV SOLN
12.5000 g | INTRAVENOUS | Status: AC
  Administered 2023-06-29: 12.5 g via INTRAVENOUS

## 2023-06-29 MED ORDER — CHOLECALCIFEROL 10 MCG/ML (400 UNIT/ML) PO LIQD
2000.0000 [IU] | Freq: Every day | ORAL | Status: DC
  Administered 2023-06-30 – 2023-07-01 (×2): 2000 [IU]
  Filled 2023-06-29 (×2): qty 5

## 2023-06-29 MED ORDER — HYDROMORPHONE HCL 1 MG/ML IJ SOLN
1.0000 mg | INTRAMUSCULAR | Status: DC | PRN
  Administered 2023-06-29: 1 mg via INTRAVENOUS
  Administered 2023-06-29 (×2): 0.5 mg via INTRAVENOUS
  Administered 2023-06-30: 1 mg via INTRAVENOUS
  Filled 2023-06-29 (×3): qty 1

## 2023-06-29 MED ORDER — K PHOS MONO-SOD PHOS DI & MONO 155-852-130 MG PO TABS
500.0000 mg | ORAL_TABLET | Freq: Once | ORAL | Status: DC
  Filled 2023-06-29: qty 2

## 2023-06-29 MED ORDER — QUETIAPINE FUMARATE 25 MG PO TABS
25.0000 mg | ORAL_TABLET | Freq: Every day | ORAL | Status: DC
  Administered 2023-06-29: 25 mg via ORAL
  Filled 2023-06-29: qty 1

## 2023-06-29 MED ORDER — POTASSIUM CHLORIDE 20 MEQ PO PACK: 40.0000 meq | PACK | Freq: Once | ORAL | Status: DC

## 2023-06-29 MED ORDER — OSMOLITE 1.2 CAL PO LIQD
1000.0000 mL | ORAL | Status: DC
Start: 2023-06-29 — End: 2023-07-04
  Administered 2023-06-29 – 2023-07-03 (×4): 1000 mL

## 2023-06-29 MED ORDER — ORAL CARE MOUTH RINSE: 15.0000 mL | OROMUCOSAL | Status: DC | PRN

## 2023-06-29 NOTE — Progress Notes (Signed)
[facility] Progress Note  Patient: [name]    WUJ:[id number]  DOA: 06/25/2023     Date of Service: the patient was seen and examined on 06/29/2023  Chief Complaint  Patient presents with   Headache   Brief hospital course: 73 y.o female with significant PMH of PAF, IBS, diverticulosis, DVT on Eliquis, HTN, IDA, HLD, OSA, anxiety and depression who presented to the ED with chief complaints of severe headache.   Per ED reports, patient apparently fell on Monday hitting the back of her head and may have lost consciousness per her brother.  Patient complained of posterior headaches for about 2 to 3 days but refused to seek care.  She went out to lunch today with friends and began complaining of severe headache.  EMS was called and patient transported to the ED.   ED Course: Initial vital signs showed HR of 60 beats/minute, BP 138/70 mm Hg, the RR 18 breaths/minute, and the oxygen saturation 100% on RA and a temperature of 97.36F (36.4C). Patient was noted with change in mental status while waiting in triage.  Per triage nurse, patient suddenly stopped responding and was drooling at the corner of her mouth with some stiffening of her hands and left sided gaze.  She was given 1 mg Ativan IV and immediately taken to a room where stat head CT obtained.    Pertinent Labs/Diagnostics Findings: Unremarkable CMP WBC: 6.4K/L, Hgb/Hct: 6.1/18.3 Plts: 143 CTH> large left subdural hematoma with mass effect on the left lateral ventricles    Given finding as above, neurosurgery was consulted and patient taken emergently to the OR for decompression.  Patient transferred to the ICU postprocedure and PCCM consulted.  2/14: Admitted with traumatic subdural hematoma s/p emergent crani for hematoma evacuation.  Extubated and transferred to the ICU.  PCCM consulted 2/15:POD# 1 Agitated overnight but able to follow commands 2/16: POD# 2 Patient c/o headache worsening in frequency, intensity and  severity. Repeat CTH no evidence of recurrent SDH or stroke. Drain removed, cont with speech difficulty. MRI Brain, EEG ordered 2/17: POD# 3. No significant event overnight. MRI Brain neg.  EEG: This study is suggestive of cortical dysfunction arising from left temporal region likely secondary to underlying sub dural hematoma/ cranioplasty. No seizures or epileptiform discharges were seen throughout the recording.   Procedures:  2/14:Left-sided craniotomy for hematoma evacuation   Assessment and Plan:  Traumatic subdural hematoma with left-to-right midline shift S/p left craniotomy done on 2/14 Drain removed on 2/16 - Continue keppra - ok to resume home Eliquis on POD 14 (2/28) - repeat head CT around 4 weeks post-op. Pt may need an MMA embo in the future as per NeuroSx follow neurosurgery for further recommendation.    #Altered Mental Status #Seizure Activity likely in the setting for tSDH -Repeat imaging as above stable -Seizure precautions -Seizure prophylaxis with Keppra, received loading dose in the ED will continue 500mg  BID -Lorazepam/Versed PRN for breakthrough seizure -EEG and Neurology consult if with recurrent seizure post crani   #Acute Blood Loss Anemia due to tSDH Hx of IDA Hgb 6.1 on presentation s/p 2units PRBCs -Maintain MAP >65 -Monitor for S/Sx of bleeding -Trend CBC (H&H q6h) -SCD's for VTE Prophylaxis (chemical ppx contraindicated) -Transfuse for Hgb <7  (has received 2 units PRBC's so far)   #Paroxysmal AF rate controlled #HTN #HLD Hx of LVH -Hold diltiazem, spironolactone, Imdur and metoprolol for now while on Nicardipine gtt -Hold Eliquis -Continue Atorvastatin and Lipitor once able to take po   #  Anxiety and Depression -continue home Citalopram  and Lorazepam o     Body mass index is 24.91 kg/m.  Interventions:  Diet: NG tube feeding DVT Prophylaxis: SCD  Advance goals of care discussion: Full code  Family Communication: family was  present at bedside, at the time of interview.  Patient is confused, AO x 1 sometimes, but due to medications she remained more confused    Disposition:  Pt is from Home, admitted with left subdural hematoma, s/p craniotomy, right arm flaccid.  On NG tube feeding, which precludes a safe discharge. Discharge to SNF, when stable and cleared by neurosurgery..  Subjective: No significant events overnight, patient remained confused, family was at bedside, sometime patient is AO x 1 but now she received pain medications are very confused, unable to offer any complaints at this time, she is moving left arm and leg spontaneously.  Unable to follow, did not answer any questions.  Physical Exam: General: NAD, lying comfortably but very confused Appear in no distress, affect appropriate Eyes: PERRLA ENT: Oral Mucosa Clear, moist  Neck: no JVD,  Cardiovascular: S1 and S2 Present, no Murmur,  Respiratory: good respiratory effort, Bilateral Air entry equal and Decreased, no Crackles, no wheezes Abdomen: Bowel Sound present, Soft and no tenderness,  Skin: no rashes Extremities: no Pedal edema, no calf tenderness Neurologic: Right arm flaccid paralysis, moving left arm spontaneously, also moving legs spontaneously as per family. Gait not checked due to patient safety concerns  Vitals:   06/29/23 1500 06/29/23 1600 06/29/23 1700 06/29/23 1746  BP: (!) 167/67 (!) 162/59 (!) 173/58 (!) 202/104  Pulse: 61 67 71 80  Resp: 15 17 19 10   Temp:      TempSrc:      SpO2: 97% 97% 96% 96%  Weight:        Intake/Output Summary (Last 24 hours) at 06/29/2023 1822 Last data filed at 06/29/2023 1600 Gross per 24 hour  Intake 1149.85 ml  Output 350 ml  Net 799.85 ml   Filed Weights   06/26/23 0600 06/28/23 0400 06/29/23 0500  Weight: 70.3 kg 67.4 kg 59.8 kg    Data Reviewed: I have personally reviewed and interpreted daily labs, tele strips, imagings as discussed above. I reviewed all nursing notes,  pharmacy notes, vitals, pertinent old records I have discussed plan of care as described above with RN and patient/family.  CBC: Recent Labs  Lab 06/25/23 1333 06/25/23 2331 06/26/23 0615 06/27/23 0244 06/28/23 0320 06/29/23 0509  WBC 6.4 13.9* 12.0* 11.7* 11.2* 10.0  NEUTROABS 2.9  --   --   --   --   --   HGB 6.1* 12.9 12.3 12.4 12.3 12.3  HCT 18.3* 36.5 35.3* 36.4 34.4* 36.4  MCV 94.8 87.5 88.5 89.7 88.4 89.9  PLT 143* 197 180 172 206 197   Basic Metabolic Panel: Recent Labs  Lab 06/25/23 1451 06/25/23 2048 06/26/23 0615 06/26/23 1507 06/27/23 0244 06/27/23 0819 06/27/23 1436 06/27/23 2015 06/28/23 0320 06/28/23 0815 06/29/23 0509  NA 138   < > 137   < > 136   < > 134* 135 134*  136 136 135  K 4.1  --  3.6  --  3.4*  --   --   --  3.8  --  3.4*  CL 100  --  106  --  102  --   --   --  102  --  102  CO2 27  --  26  --  22  --   --   --  22  --  24  GLUCOSE 103*  --  103*  --  87  --   --   --  71  --  94  BUN 10  --  10  --  11  --   --   --  11  --  11  CREATININE 0.75  --  0.67  --  0.63  --   --   --  0.60  --  0.60  CALCIUM 9.4  --  8.6*  --  8.5*  --   --   --  8.1*  --  8.4*  MG  --   --  1.7  --  2.0  --   --   --  1.8  --  1.8  PHOS  --   --   --   --   --   --   --   --   --   --  2.4*   < > = values in this interval not displayed.    Studies: No results found.  Scheduled Meds:  Chlorhexidine Gluconate Cloth  6 each Topical Daily   feeding supplement  237 mL Oral BID BM   free water  50 mL Per Tube Q4H   heparin injection (subcutaneous)  5,000 Units Subcutaneous Q12H   LORazepam  0.5 mg Intravenous Once   metoprolol tartrate  25 mg Oral BID   mouth rinse  15 mL Mouth Rinse 4 times per day   phosphorus  500 mg Oral Once   Continuous Infusions:  acetaminophen Stopped (06/29/23 0346)   famotidine (PEPCID) IV Stopped (06/29/23 1231)   feeding supplement (OSMOLITE 1.2 CAL)     levETIRAcetam Stopped (06/29/23 1149)   PRN Meds: acetaminophen,  docusate sodium, HYDROmorphone (DILAUDID) injection, ipratropium-albuterol, labetalol, LORazepam, mouth rinse, polyethylene glycol  Time spent: 55 minutes  Author: Gillis Santa. MD [facility] 06/29/2023 6:22 PM  To reach On-call, see care teams to locate the attending and reach out to them via [email or web address]. If 7PM-7AM, please contact night-coverage If you still have difficulty reaching the attending provider, please page the [facility] (Director on Call) for [facility] on amion for assistance.

## 2023-06-29 NOTE — Consult Note (Addendum)
PHARMACY CONSULT NOTE - FOLLOW UP  Pharmacy Consult for Electrolyte Monitoring and Replacement   Recent Labs: Potassium (mmol/L)  Date Value  06/29/2023 3.4 (L)  11/22/2013 3.3 (L)   Magnesium (mg/dL)  Date Value  82/95/6213 1.8  11/22/2013 1.7 (L)   Calcium (mg/dL)  Date Value  08/65/7846 8.4 (L)   Calcium, Total (mg/dL)  Date Value  96/29/5284 8.7   Albumin (g/dL)  Date Value  13/24/4010 4.1  11/21/2013 3.8   Phosphorus (mg/dL)  Date Value  27/25/3664 2.4 (L)   Sodium (mmol/L)  Date Value  06/29/2023 135  02/27/2020 128 (L)  11/22/2013 138    Assessment: 72yo female who presented with a subdural hematoma with significant PMH of PAF, IBS, diverticulosis, DVT on Eliquis, HTN, IDA, HLD, OSA, anxiety and depression.  Due to ongoing brain compression and symptoms, surgical intervention was recommended. S/p procedure 2/15. Pharmacy was consulted to manage this patient's electrolytes while in the ICU.   Diet: Feeding supplement (Ensure) 237 mL BID  Fluids: NA Medications: NA  Goal of Therapy:  WNL Today, 06/29/2023 K = 3.4 Mg = 1.8  Phos = 2.4  Plan:  Replace with 10 mEq KCl IV x 4 (per nurse, originally ordered PO) No other replacement indicated at this time Continue to monitor with AM labs   Effie Shy, PharmD Pharmacy Resident  06/29/2023 6:29 AM

## 2023-06-29 NOTE — Progress Notes (Signed)
Neurosurgery Progress Note  History: RETHA BITHER is here for subdural hematoma s/p craniotomy for evacuation on 06/25/2023.  POD4: RN was told she had an episode of lucidity overnight but has been confused and moaning this morning. Pain appears to have improved with PRNs. Does not appear to be grabbing for her head.  POD3: continues confusion and trouble with speech this morning POD2: She had agitation overnight.  Continues to have difficulty with speech. POD1: She is agitated but has improved compared to pre-op.  Physical Exam: Vitals:   06/29/23 0742 06/29/23 0800  BP: (!) 152/58 (!) 152/61  Pulse: 77 71  Resp: 18 (!) 22  Temp: 98.4 F (36.9 C)   SpO2: 95% 100%   AA Ox self. Unable to accurately recall place or time  CNI with  R facial droop  Mobing LUE and BLE to pain.   RUE with tone but without movement to pain   Data:  Other tests/results:  CT head 2/16  FINDINGS: Brain: No significant reaccumulation of the recently evaluated subdural hematoma on the left, on a few coronal slices it is possible there is trace increase in blood products along the craniotomy flap. Dominant residual is still left parafalcine at the frontal parietal junction measuring up to 12 mm in thickness. Trace midline shift without entrapment, stable on coronal reformats. No evidence of infarct   Vascular: No hyperdense vessel or unexpected calcification.   Skull: Unremarkable left-sided craniotomy site.   Sinuses/Orbits: Negative   IMPRESSION: Essentially stable postoperative head CT.  No new abnormality.     Electronically Signed   By: Tiburcio Pea M.D.   On: 06/27/2023 04:11  MRI brain 2/16 IMPRESSION: 1. Post-operative changes from prior left subdural hematoma evacuation. The residual subdural hematoma is stable in size as compared to the head CT performed earlier today. As before, the largest component is located along the posterior falx, measuring 12 mm in thickness. 2.  A thin subdural collection (likely hematoma) is also present along the mid and anterior falx, and left cerebral convexity (measuring up to 3 mm in thickness). 3. Mild chronic small vessel ischemic changes within the cerebral white matter and pons. 4. Few chronic microhemorrhages within the left parietal and occipital lobes.     Electronically Signed   By: Jackey Loge D.O.   On: 06/27/2023 17:33  Assessment/Plan:  ILDA LASKIN has shown some improvement after subdural hematoma evacuation but has continued speech abnormality and RUE paresis. MRI and head CT reassuring.   - mobilize - pain control - minimize narcotics - ok for DVT prophylaxis from neurosurgical standpoint  - PTOTST - ok to start - Drain removed 2/16 - Continue keppra - EEG 2/17 did not show seizures  - ok to resume home Eliquis on POD 14.  - Will plan for repeat head CT around 4 weeks post-op. Pt may need an MMA embo in the future.   Manning Charity PA-C Department of Neurosurgery

## 2023-06-29 NOTE — Progress Notes (Signed)
Physical Therapy Treatment Patient Details Name: [name] MRN: [id number] DOB: 11-08-1950 Today's Date: 06/29/2023   History of Present Illness 73 y.o female with significant PMH of PAF, IBS, diverticulosis, DVT on Eliquis, HTN, IDA, HLD, OSA, anxiety and depression who presented to the ED with chief complaints of severe headache. Admitted with traumatic subdural hematoma s/p craniotomy for evacuation on 06/25/2023.    PT Comments  Patient is lethargic today. Multimodal sensory stimulation provided to increase alertness for participation with therapeutic intervention. Max A for bed mobility. Patient does have protective righting reactions with sitting and can maintain balance with brief periods of stand by assistance. She needs constant stimulation to keep eyes opened and has difficulty following single step commands today. Recommend to continue PT to maximize independence and facilitate return to prior level of function.    If plan is discharge home, recommend the following: Two people to help with walking and/or transfers;Two people to help with bathing/dressing/bathroom;Assistance with feeding;Direct supervision/assist for medications management;Assistance with cooking/housework;Direct supervision/assist for financial management   Can travel by private vehicle     No  Equipment Recommendations   (to be determined at next level of care)    Recommendations for Other Services       Precautions / Restrictions Precautions Precautions: Fall Recall of Precautions/Restrictions: Impaired Restrictions Weight Bearing Restrictions Per Provider Order: No     Mobility  Bed Mobility Overal bed mobility: Needs Assistance Bed Mobility: Supine to Sit, Sit to Supine     Supine to sit: Max assist Sit to supine: Max assist   General bed mobility comments: assistance for trunk and BLE support. increased participation with multi modal cues    Transfers                   General  transfer comment: not attempted due to poor participation    Ambulation/Gait                   Stairs             Wheelchair Mobility     Tilt Bed    Modified Rankin (Stroke Patients Only)       Balance Overall balance assessment: Needs assistance Sitting-balance support: Feet supported Sitting balance-Leahy Scale: Poor Sitting balance - Comments: external support required to maintain sitting balance. patient initially with loss of balance to the right and posteriorly that improved with increased sitting time. brief preiods of SBA Postural control: Posterior lean, Right lateral lean                                  Communication Communication Communication: Impaired Factors Affecting Communication: Difficulty expressing self  Cognition Arousal: Lethargic Behavior During Therapy: Flat affect   PT - Cognitive impairments: Difficult to assess Difficult to assess due to: Level of arousal, Impaired communication                     PT - Cognition Comments: patient has difficulty following single step commands. multi modal cues required as well as multi modal sensory cues to stimulate arousual for increased alertness Following commands: Impaired Following commands impaired: Follows one step commands inconsistently    Cueing Cueing Techniques: Verbal cues, Tactile cues, Gestural cues, Visual cues  Exercises      General Comments General comments (skin integrity, edema, etc.): vitals stable throughout session. education provided to brother about positioning of RUE as  patient continues to have no active movement.      Pertinent Vitals/Pain Pain Assessment Pain Assessment: CPOT Breathing: normal Negative Vocalization: none Facial Expression: smiling or inexpressive Body Language: relaxed Consolability: no need to console PAINAD Score: 0    Home Living                          Prior Function            PT Goals  (current goals can now be found in the care plan section) Acute Rehab PT Goals Patient Stated Goal: patient unable to state PT Goal Formulation: Patient unable to participate in goal setting Time For Goal Achievement: 07/09/23 Potential to Achieve Goals: Fair Progress towards PT goals: Progressing toward goals    Frequency    Min 1X/week      PT Plan      Co-evaluation              AM-PAC PT "6 Clicks" Mobility   Outcome Measure  Help needed turning from your back to your side while in a flat bed without using bedrails?: A Lot Help needed moving from lying on your back to sitting on the side of a flat bed without using bedrails?: A Lot Help needed moving to and from a bed to a chair (including a wheelchair)?: Total Help needed standing up from a chair using your arms (e.g., wheelchair or bedside chair)?: Total Help needed to walk in hospital room?: Total Help needed climbing 3-5 steps with a railing? : Total 6 Click Score: 8    End of Session   Activity Tolerance: Patient limited by fatigue Patient left: in bed;with call bell/phone within reach;with bed alarm set Nurse Communication: Mobility status PT Visit Diagnosis: Muscle weakness (generalized) (M62.81);Other abnormalities of gait and mobility (R26.89);Hemiplegia and hemiparesis Hemiplegia - Right/Left: Right Hemiplegia - caused by: Other cerebrovascular disease     Time: 2440-1027 PT Time Calculation (min) (ACUTE ONLY): 27 min  Charges:    $Therapeutic Activity: 23-37 mins PT General Charges $$ ACUTE PT VISIT: 1 Visit                     Donna Bernard, PT, MPT    [name] 06/29/2023, 3:14 PM

## 2023-06-29 NOTE — Progress Notes (Signed)
Initial Nutrition Assessment  DOCUMENTATION CODES:   Not applicable  INTERVENTION:   Osmolite 1.2@60ml /hr- Initiate at 20ml/hr and increase by 53ml/hr q 8 hours until goal rate is reached.   Free water flushes 50ml q4 hours to maintain tube patency   Regimen provides 1728kcal/day, 80g/day protein and 1439ml/day of free water.   Pt at high refeed risk; recommend monitor potassium, magnesium and phosphorus labs daily until stable  Daily weights  Cholecalciferol 2,000 units daily via tube   NUTRITION DIAGNOSIS:   Inadequate oral intake related to lethargy/confusion as evidenced by NPO status.  GOAL:   Patient will meet greater than or equal to 90% of their needs  MONITOR:   Diet advancement, Labs, Weight trends, TF tolerance, Skin, I & O's  REASON FOR ASSESSMENT:   Consult Enteral/tube feeding initiation and management  ASSESSMENT:   73 y/o female with h/o HTN, esophageal stricture, PAF, IDA, diverticulitis, COPD, s/p cardiac catheterization, s/p laparoscopic hiatal hernia repair and Nissen fundoplication 2007, anxiety, depression, SVT, HLD, GERD, DVT, IBS-C and OSA who is admitted with traumatic subdural hematoma with left-to-right midline shift s/p left craniotomy for evacuation of hematoma 2/14 complicated by seizures and AMS.  Visited pt's room today. Pt with AMS and is unable to provide any history. Per chart, pt eating only bites this admission and has now been without adequate nutrition for 5 days. NGT placed today (gastric); plan is to initiate tube feeds. Pt is at high refeed risk. Per chart, pt appears fairly weight stable at baseline but appears to be down ~22lbs since admission. RD unsure if bed weights are correct. Of note, pt with vitamin D deficiency; will provide supplementation.   Medications reviewed and include: heparin, Kphos, pepcid  Labs reviewed: K 3.4(L), P 2.4(L), Mg 1.8 wnl Vitamin D 18.58(L)- 2/18 Cbgs- 71, 141, 66 x 24 hrs   NUTRITION -  FOCUSED PHYSICAL EXAM:  Flowsheet Row Most Recent Value  Orbital Region No depletion  Upper Arm Region No depletion  Thoracic and Lumbar Region No depletion  Buccal Region No depletion  Temple Region No depletion  Clavicle Bone Region Mild depletion  Clavicle and Acromion Bone Region Mild depletion  Scapular Bone Region No depletion  Dorsal Hand No depletion  Patellar Region Mild depletion  Anterior Thigh Region No depletion  Posterior Calf Region No depletion  Edema (RD Assessment) None  Hair Reviewed  Eyes Reviewed  Mouth Reviewed  Skin Reviewed  Nails Reviewed   Diet Order:   Diet Order             DIET - DYS 1 Room service appropriate? Yes with Assist; Fluid consistency: Thin  Diet effective now                  EDUCATION NEEDS:   No education needs have been identified at this time  Skin:  Skin Assessment: Reviewed RN Assessment (incision head)  Last BM:  PTA  Height:   Ht Readings from Last 1 Encounters:  06/29/23 5\' 1"  (1.549 m)    Weight:   Wt Readings from Last 1 Encounters:  06/29/23 59.8 kg    Ideal Body Weight:  47.7 kg  BMI:  Body mass index is 24.91 kg/m.  Estimated Nutritional Needs:   Kcal:  1500-1700kcal/day  Protein:  75-85g/day  Fluid:  1.3-1.5L/day  Betsey Holiday MS, RD, LDN If unable to be reached, please send secure chat to "RD inpatient" available from 8:00a-4:00p daily

## 2023-06-30 ENCOUNTER — Inpatient Hospital Stay: Payer: 59

## 2023-06-30 ENCOUNTER — Encounter: Payer: Self-pay | Admitting: Neurosurgery

## 2023-06-30 DIAGNOSIS — S065XAA Traumatic subdural hemorrhage with loss of consciousness status unknown, initial encounter: Secondary | ICD-10-CM | POA: Diagnosis not present

## 2023-06-30 LAB — BASIC METABOLIC PANEL
Anion gap: 10 (ref 5–15)
BUN: 9 mg/dL (ref 8–23)
CO2: 25 mmol/L (ref 22–32)
Calcium: 8.9 mg/dL (ref 8.9–10.3)
Chloride: 99 mmol/L (ref 98–111)
Creatinine, Ser: 0.63 mg/dL (ref 0.44–1.00)
GFR, Estimated: >60 mL/min (ref >60)
Glucose, Bld: 125 mg/dL — ABNORMAL HIGH (ref 70–99)
Potassium: 3.9 mmol/L (ref 3.5–5.1)
Sodium: 134 mmol/L — ABNORMAL LOW (ref 135–145)

## 2023-06-30 LAB — CBC
HCT: 36.5 % (ref 36.0–46.0)
Hemoglobin: 12.5 g/dL (ref 12.0–15.0)
MCH: 30.8 pg (ref 26.0–34.0)
MCHC: 34.2 g/dL (ref 30.0–36.0)
MCV: 89.9 fL (ref 80.0–100.0)
Platelets: 211 10*3/uL (ref 150–400)
RBC: 4.06 MIL/uL (ref 3.87–5.11)
RDW: 14.1 % (ref 11.5–15.5)
WBC: 7.8 10*3/uL (ref 4.0–10.5)
nRBC: 0 % (ref 0.0–0.2)

## 2023-06-30 LAB — GLUCOSE, CAPILLARY
Glucose-Capillary: 102 mg/dL — ABNORMAL HIGH (ref 70–99)
Glucose-Capillary: 108 mg/dL — ABNORMAL HIGH (ref 70–99)
Glucose-Capillary: 110 mg/dL — ABNORMAL HIGH (ref 70–99)
Glucose-Capillary: 45 mg/dL — ABNORMAL LOW (ref 70–99)
Glucose-Capillary: 72 mg/dL (ref 70–99)
Glucose-Capillary: 82 mg/dL (ref 70–99)
Glucose-Capillary: 93 mg/dL (ref 70–99)

## 2023-06-30 LAB — MAGNESIUM: Magnesium: 1.6 mg/dL — ABNORMAL LOW (ref 1.7–2.4)

## 2023-06-30 LAB — PHOSPHORUS: Phosphorus: 2.6 mg/dL (ref 2.5–4.6)

## 2023-06-30 MED ORDER — HYDROMORPHONE HCL 1 MG/ML IJ SOLN
0.5000 mg | INTRAMUSCULAR | Status: DC | PRN
  Administered 2023-06-30 – 2023-07-04 (×9): 0.5 mg via INTRAVENOUS
  Administered 2023-07-04: 1 mg via INTRAVENOUS
  Administered 2023-07-04: 0.5 mg via INTRAVENOUS
  Administered 2023-07-05 – 2023-07-06 (×8): 1 mg via INTRAVENOUS
  Filled 2023-06-30 (×20): qty 1

## 2023-06-30 MED ORDER — METOPROLOL TARTRATE 25 MG PO TABS
25.0000 mg | ORAL_TABLET | Freq: Two times a day (BID) | ORAL | Status: DC
  Administered 2023-06-30 – 2023-07-01 (×2): 25 mg
  Filled 2023-06-30 (×3): qty 1

## 2023-06-30 MED ORDER — QUETIAPINE FUMARATE 25 MG PO TABS
25.0000 mg | ORAL_TABLET | Freq: Every day | ORAL | Status: DC
  Administered 2023-06-30 – 2023-07-11 (×12): 25 mg
  Filled 2023-06-30 (×12): qty 1

## 2023-06-30 MED ORDER — SODIUM BICARBONATE 650 MG PO TABS
650.0000 mg | ORAL_TABLET | Freq: Once | ORAL | Status: AC
  Administered 2023-06-30: 650 mg
  Filled 2023-06-30: qty 1

## 2023-06-30 MED ORDER — MAGNESIUM SULFATE 2 GM/50ML IV SOLN
2.0000 g | Freq: Once | INTRAVENOUS | Status: AC
  Administered 2023-06-30: 2 g via INTRAVENOUS
  Filled 2023-06-30: qty 50

## 2023-06-30 MED ORDER — PANCRELIPASE (LIP-PROT-AMYL) 10440-39150 UNITS PO TABS
20880.0000 [IU] | ORAL_TABLET | Freq: Once | ORAL | Status: AC
  Administered 2023-06-30: 20880 [IU]
  Filled 2023-06-30: qty 2

## 2023-06-30 NOTE — Telephone Encounter (Signed)
 Patient is currently admitted

## 2023-06-30 NOTE — TOC Initial Note (Signed)
Transition of Care [facility]) - Initial/Assessment Note    Patient Details  Name: [name] MRN: [id number] Date of Birth: June 23, 1950  Transition of Care [facility]) CM/SW Contact:    Margarito Liner, LCSW Phone Number: 06/30/2023, 4:28 PM  Clinical Narrative:  Patient only oriented to self. Daughter not at bedside. CSW called her, introduced role, and explained that therapy recommendations. Daughter is interested in CIR because she was told this would be the best rehab venue for her recovery. CSW explained that she would have to be able to tolerate 3-4 hours of therapy per day to be considered a candidate. She would also have to have 24/7 supervision after discharge. Discussed SNF placement as well. Patient still with NG. Will send out referral once nutrition plan determined. CSW updated PT, OT, and CIR admissions coordinator. At this time, CIR is not an option for her due to tolerance level. No further concerns. CSW will continue to follow patient and her daughter for support and facilitate discharge once medically stable.                Expected Discharge Plan: Skilled Nursing Facility Barriers to Discharge: Continued Medical Work up   Patient Goals and CMS Choice            Expected Discharge Plan and Services     Post Acute Care Choice: IP Rehab, Skilled Nursing Facility Living arrangements for the past 2 months: Single Family Home                                      Prior Living Arrangements/Services Living arrangements for the past 2 months: Single Family Home Lives with:: Self Patient language and need for interpreter reviewed:: Yes        Need for Family Participation in Patient Care: Yes (Comment) Care giver support system in place?: Yes (comment)   Criminal Activity/Legal Involvement Pertinent to Current Situation/Hospitalization: No - Comment as needed  Activities of Daily Living      Permission Sought/Granted Permission sought to share information with  : Facility Medical sales representative, Family Supports    Share Information with NAME: [name]  Permission granted to share info w AGENCY: SNF's  Permission granted to share info w Relationship: Daughter  Permission granted to share info w Contact Information: [phone]  Emotional Assessment Appearance:: Appears stated age Attitude/Demeanor/Rapport: Unable to Assess Affect (typically observed): Unable to Assess Orientation: : Oriented to Self Alcohol / Substance Use: Not Applicable Psych Involvement: No (comment)  Admission diagnosis:  Seizure (HCC) [R56.9] Subdural hematoma (HCC) [S06.5XAA] Patient Active Problem List   Diagnosis Date Noted   Subdural hematoma (HCC) 06/25/2023   Midline shift of brain with brain compression (HCC) 06/25/2023   Glasgow coma scale total score 9-12 06/25/2023   Acute metabolic encephalopathy 03/01/2022   Anxiety and depression 03/01/2022   Dyslipidemia 03/01/2022   GERD without esophagitis 03/01/2022   SIRS (systemic inflammatory response syndrome) (HCC) 02/28/2022   Gastroenteritis due to norovirus    Nausea vomiting and diarrhea    Hypomagnesemia    Hypokalemia    Hyperlipidemia    Gastroesophageal reflux disease without esophagitis    Intractable vomiting 08/08/2020   Esophagitis on CT 08/08/2020   SVT (supraventricular tachycardia) (HCC) 03/07/2019   Acute respiratory distress 11/16/2017   Acute respiratory failure (HCC) 11/16/2017   Screening for lipid disorders 07/28/2016   DOE (dyspnea on exertion) 07/02/2016  Bradycardia 07/02/2016   Anxiety 07/02/2016   Anxiety state    Atypical chest pain 03/08/2015   Fever 08/12/2010   ABDOMINAL PAIN, LEFT LOWER QUADRANT 06/23/2010   DIVERTICULITIS, ACUTE 02/25/2010   CONSTIPATION 01/22/2010   IRRITABLE BOWEL SYNDROME 01/22/2010   Essential hypertension 01/21/2010   HYPERTENSION, CONTROLLED 01/21/2010   DEEP VENOUS THROMBOPHLEBITIS 01/21/2010   ESOPHAGEAL STRICTURE 02/29/2004    Diverticulosis of colon 02/29/2004   PCP:  [name], MD Pharmacy:   [facility] [address], [place] - [address] [place] [address] Phone: [phone] Fax: [phone]     Social Drivers of Health (SDOH) Social History: SDOH Screenings   Food Insecurity: No Food Insecurity (03/29/2023)   Received from [facility]  Housing: Low Risk  (05/26/2023)   Received from [facility]  Transportation Needs: No Transportation Needs (03/29/2023)   Received from [facility]  Utilities: Not At Risk (03/29/2023)   Received from [facility]  Financial Resource Strain: Low Risk  (03/29/2023)   Received from [facility]  Tobacco Use: Low Risk  (06/25/2023)   SDOH Interventions:     Readmission Risk Interventions     No data to display

## 2023-06-30 NOTE — Progress Notes (Signed)
Occupational Therapy Treatment Patient Details Name: [name] MRN: [id number] DOB: Mar 04, 1951 Today's Date: 06/30/2023   History of present illness 73 y.o female with significant PMH of PAF, IBS, diverticulosis, DVT on Eliquis, HTN, IDA, HLD, OSA, anxiety and depression who presented to the ED with chief complaints of severe headache. Admitted with traumatic subdural hematoma s/p craniotomy for evacuation on 06/25/2023.   OT comments  [name] was seen for OT/PT co-treatment on this date. Upon arrival to room pt in bed, lethargic t/o session. Pt requires TOTAL A hand over hand assist grooming at bed level. TOTAL A don/doff socks at bed level. Multimodal sensory stimulation provided to increase alertness for participation with therapeutic intervention - arouses to auditory stimuli and briefly tracks noise in room but difficulty maintaining eyes open. Limited command following. Pt making limited progress toward goals, will continue to follow POC. Discharge recommendation remains appropriate.        If plan is discharge home, recommend the following:  Two people to help with walking and/or transfers;Two people to help with bathing/dressing/bathroom   Equipment Recommendations  Other (comment) (defer)    Recommendations for Other Services      Precautions / Restrictions Precautions Precautions: Fall Recall of Precautions/Restrictions: Impaired Restrictions Weight Bearing Restrictions Per Provider Order: No       Mobility Bed Mobility Overal bed mobility: Needs Assistance Bed Mobility: Supine to Sit, Sit to Supine     Supine to sit: Max assist, +2 for physical assistance Sit to supine: Total assist, +2 for physical assistance        Transfers Overall transfer level: Needs assistance   Transfers: Bed to chair/wheelchair/BSC            Lateral/Scoot Transfers: Total assist, +2 physical assistance       Balance Overall balance assessment: Needs  assistance Sitting-balance support: Feet supported Sitting balance-Leahy Scale: Poor   Postural control: Posterior lean, Right lateral lean                                 ADL either performed or assessed with clinical judgement   ADL Overall ADL's : Needs assistance/impaired                                       General ADL Comments: TOTAL A hand over hand assist grooming at bed level. TOTAL A don/doff socks at bed level     Communication Communication Communication: Impaired   Cognition Arousal: Lethargic Behavior During Therapy: Flat affect Cognition: Difficult to assess, Cognition impaired Difficult to assess due to: Level of arousal                             Following commands: Impaired Following commands impaired: Follows one step commands inconsistently      Cueing   Cueing Techniques: Verbal cues, Tactile cues, Gestural cues, Visual cues  Exercises Exercises: Other exercises Other Exercises Other Exercises: Tolerated multimodal sensory arousal techniques with no change in vitals - arouses to auditory stimuli and briefly tracks noise in room but difficulty maintaining eyes open    Shoulder Instructions       General Comments      Pertinent Vitals/ Pain       Pain Assessment Pain Assessment: CPOT Facial Expression: Relaxed, neutral Body Movements:  Absence of movements Muscle Tension: Tense, rigid Compliance with ventilator (intubated pts.): N/A Vocalization (extubated pts.): Talking in normal tone or no sound CPOT Total: 1 Pain Intervention(s): Repositioned   Frequency  Min 1X/week        Progress Toward Goals  OT Goals(current goals can now be found in the care plan section)  Progress towards OT goals: Progressing toward goals  Acute Rehab OT Goals OT Goal Formulation: With family Time For Goal Achievement: 07/11/23 Potential to Achieve Goals: Fair ADL Goals Pt Will Perform Eating: with  set-up;with supervision;bed level Pt Will Perform Grooming: with min assist;sitting Pt Will Transfer to Toilet: with mod assist;squat pivot transfer;bedside commode  Plan      Co-evaluation    PT/OT/SLP Co-Evaluation/Treatment: Yes Reason for Co-Treatment: To address functional/ADL transfers;Necessary to address cognition/behavior during functional activity;For patient/therapist safety;Complexity of the patient's impairments (multi-system involvement) PT goals addressed during session: Mobility/safety with mobility;Balance OT goals addressed during session: ADL's and self-care      AM-PAC OT "6 Clicks" Daily Activity     Outcome Measure   Help from another person eating meals?: Total Help from another person taking care of personal grooming?: Total Help from another person toileting, which includes using toliet, bedpan, or urinal?: Total Help from another person bathing (including washing, rinsing, drying)?: Total Help from another person to put on and taking off regular upper body clothing?: Total Help from another person to put on and taking off regular lower body clothing?: Total 6 Click Score: 6    End of Session    OT Visit Diagnosis: Other abnormalities of gait and mobility (R26.89);Muscle weakness (generalized) (M62.81)   Activity Tolerance Patient tolerated treatment well   Patient Left in bed;with call bell/phone within reach   Nurse Communication          Time: 5409-8119 OT Time Calculation (min): 23 min  Charges: OT General Charges $OT Visit: 1 Visit OT Treatments $Self Care/Home Management : 8-22 mins  Kathie Dike, M.S. OTR/L  06/30/23, 12:54 PM  ascom [phone]

## 2023-06-30 NOTE — Progress Notes (Signed)
Physical Therapy Treatment Patient Details Name: [name] MRN: [id number] DOB: December 25, 1950 Today's Date: 06/30/2023   History of Present Illness 73 y.o female with significant PMH of PAF, IBS, diverticulosis, DVT on Eliquis, HTN, IDA, HLD, OSA, anxiety and depression who presented to the ED with chief complaints of severe headache. Admitted with traumatic subdural hematoma s/p craniotomy for evacuation on 06/25/2023.    PT Comments  Patient seen in conjunction with OT for co-tx to maximize patient's ability and tolerance. Patient lethargic throughout session requiring max verbal and tactile cueing to maintain wakefulness. Required max-totalA+2 for bed mobility and maxA to maintain sitting EOB. Tracks briefly to noise in room but unable maintain eyes open >10 seconds. Noted B patellar hyperreflexia with tremors. Discharge plan remains appropriate.    If plan is discharge home, recommend the following: Two people to help with walking and/or transfers;Two people to help with bathing/dressing/bathroom;Assistance with feeding;Direct supervision/assist for medications management;Assistance with cooking/housework;Direct supervision/assist for financial management   Can travel by private vehicle     No  Equipment Recommendations  Other (comment) (TBD)    Recommendations for Other Services       Precautions / Restrictions Precautions Precautions: Fall Recall of Precautions/Restrictions: Impaired Restrictions Weight Bearing Restrictions Per Provider Order: No     Mobility  Bed Mobility Overal bed mobility: Needs Assistance Bed Mobility: Supine to Sit, Sit to Supine     Supine to sit: Max assist, +2 for physical assistance Sit to supine: Total assist, +2 for physical assistance        Transfers                   General transfer comment: unsafe to attempt    Ambulation/Gait                   Stairs             Wheelchair Mobility     Tilt Bed     Modified Rankin (Stroke Patients Only)       Balance Overall balance assessment: Needs assistance Sitting-balance support: Feet supported Sitting balance-Leahy Scale: Poor                                      Communication Communication Communication: Impaired Factors Affecting Communication: Difficulty expressing self  Cognition Arousal: Lethargic Behavior During Therapy: Flat affect   PT - Cognitive impairments: Difficult to assess Difficult to assess due to: Level of arousal, Impaired communication                       Following commands: Impaired Following commands impaired: Follows one step commands inconsistently    Cueing Cueing Techniques: Verbal cues, Tactile cues, Gestural cues, Visual cues  Exercises      General Comments        Pertinent Vitals/Pain Pain Assessment Pain Assessment: CPOT Facial Expression: Relaxed, neutral Body Movements: Absence of movements Muscle Tension: Tense, rigid Compliance with ventilator (intubated pts.): N/A Vocalization (extubated pts.): Talking in normal tone or no sound CPOT Total: 1    Home Living                          Prior Function            PT Goals (current goals can now be found in the care plan section) Acute Rehab PT  Goals Patient Stated Goal: patient unable to state PT Goal Formulation: Patient unable to participate in goal setting Time For Goal Achievement: 07/09/23 Potential to Achieve Goals: Fair Progress towards PT goals: Progressing toward goals    Frequency    Min 1X/week      PT Plan      Co-evaluation PT/OT/SLP Co-Evaluation/Treatment: Yes Reason for Co-Treatment: To address functional/ADL transfers;Necessary to address cognition/behavior during functional activity;For patient/therapist safety;Complexity of the patient's impairments (multi-system involvement) PT goals addressed during session: Mobility/safety with mobility;Balance OT goals  addressed during session: ADL's and self-care      AM-PAC PT "6 Clicks" Mobility   Outcome Measure  Help needed turning from your back to your side while in a flat bed without using bedrails?: Total Help needed moving from lying on your back to sitting on the side of a flat bed without using bedrails?: Total Help needed moving to and from a bed to a chair (including a wheelchair)?: Total Help needed standing up from a chair using your arms (e.g., wheelchair or bedside chair)?: Total Help needed to walk in hospital room?: Total Help needed climbing 3-5 steps with a railing? : Total 6 Click Score: 6    End of Session   Activity Tolerance: Patient limited by fatigue Patient left: in bed;with call bell/phone within reach;with bed alarm set Nurse Communication: Mobility status PT Visit Diagnosis: Muscle weakness (generalized) (M62.81);Other abnormalities of gait and mobility (R26.89);Hemiplegia and hemiparesis Hemiplegia - Right/Left: Right     Time: 1610-9604 PT Time Calculation (min) (ACUTE ONLY): 23 min  Charges:    $Therapeutic Activity: 8-22 mins PT General Charges $$ ACUTE PT VISIT: 1 Visit                     Maylon Peppers, PT, DPT Physical Therapist - [facility]  [facility]    Kerri Asche A Sylvia Helms 06/30/2023, 2:32 PM

## 2023-06-30 NOTE — Progress Notes (Signed)
**Note Melissa-Identified via Obfuscation** Neurosurgery Progress Note  History: [name] is here for subdural hematoma s/p craniotomy for evacuation on 06/25/2023.  POD5: waxing and waning mental status.  Sleepy today. POD4: RN was told she had an episode of lucidity overnight but has been confused and moaning this morning. Pain appears to have improved with PRNs. Does not appear to be grabbing for her head.  POD3: continues confusion and trouble with speech this morning POD2: She had agitation overnight.  Continues to have difficulty with speech. POD1: She is agitated but has improved compared to pre-op.  Physical Exam: Vitals:   06/30/23 1700 06/30/23 1800  BP: (!) 145/50 (!) 172/63  Pulse: (!) 56 76  Resp: (!) 21 16  Temp:    SpO2: 98% 99%   AA Ox self. OE voice.  CNI with  R facial droop  Mobing LUE and BLE to pain.   RUE with tone but without movement to pain   Data:  Other tests/results:  CT head 2/16  FINDINGS: Brain: No significant reaccumulation of the recently evaluated subdural hematoma on the left, on a few coronal slices it is possible there is trace increase in blood products along the craniotomy flap. Dominant residual is still left parafalcine at the frontal parietal junction measuring up to 12 mm in thickness. Trace midline shift without entrapment, stable on coronal reformats. No evidence of infarct   Vascular: No hyperdense vessel or unexpected calcification.   Skull: Unremarkable left-sided craniotomy site.   Sinuses/Orbits: Negative   IMPRESSION: Essentially stable postoperative head CT.  No new abnormality.     Electronically Signed   By: Tiburcio Pea M.D.   On: 06/27/2023 04:11  MRI brain 2/16 IMPRESSION: 1. Post-operative changes from prior left subdural hematoma evacuation. The residual subdural hematoma is stable in size as compared to the head CT performed earlier today. As before, the largest component is located along the posterior falx, measuring 12 mm in  thickness. 2. A thin subdural collection (likely hematoma) is also present along the mid and anterior falx, and left cerebral convexity (measuring up to 3 mm in thickness). 3. Mild chronic small vessel ischemic changes within the cerebral white matter and pons. 4. Few chronic microhemorrhages within the left parietal and occipital lobes.     Electronically Signed   By: Jackey Loge D.O.   On: 06/27/2023 17:33  CT Head 06/30/2023 IMPRESSION: 1. Stable postoperative changes from left frontoparietal craniotomy for evacuation of a left convexity subdural hematoma with trace residual blood products along the evacuation site. 2. Unchanged interhemispheric subdural hematoma measuring up to 12 mm in thickness. 3. Unchanged minimal rightward midline shift.     Electronically Signed   By: Orvan Falconer M.D.   On: 06/30/2023 13:35  Assessment/Plan:  [name] has shown some improvement after subdural hematoma evacuation but has continued speech abnormality and RUE paresis. MRI and head CT reassuring.   - mobilize - pain control - minimize narcotics - ok for DVT prophylaxis from neurosurgical standpoint  - PTOTST - ok to start - Continue keppra - ok to resume home Eliquis on POD 14.  - Will plan for repeat head CT around 4 weeks post-op. Pt may need an MMA embo in the future. - Consider PEG if cannot meet caloric needs via PO   Venetia Night MD Department of Neurosurgery

## 2023-06-30 NOTE — Progress Notes (Addendum)
SLP Cancellation Note  Patient Details Name: [name] MRN: [id number] DOB: 01/17/1951   Cancelled treatment:       Reason Eval/Treat Not Completed: Fatigue/lethargy limiting ability to participate;Patient's level of consciousness (chart reviewed; consulted NSG re: pt's status today)  Per NSG report, pt has been orally holding Purees during meal attempts. In setting of this behavior/Cognitive decline and her subdural hematoma s/p craniotomy for evacuation on 06/25/2023, downgraded her oral diet to a FULL LIQUID consistency diet w/ strict aspiration precautions; feeding Supervision. Pills CRUSHED in Puree. Pt also has a NGT present now(placed 06/29/2023)- will need to monitor swallowing safety w/ such.  Pt was drowsy/sleepy- NSG reported pt had just recently fallen asleep. TX held at this time. Will f/u tomorrow w/ ongoing assessment. NSG agreed.    Jerilynn Som, MS, CCC-SLP Speech Language Pathologist Rehab Services; [facility] [phone] (ascom) [name] 06/30/2023, 12:50 PM

## 2023-06-30 NOTE — Progress Notes (Signed)
[facility] Progress Note  Patient: [name]    NWG:[id number]  DOA: 06/25/2023     Date of Service: the patient was seen and examined on 06/30/2023  Chief Complaint  Patient presents with   Headache   Brief hospital course: 73 y.o female with significant PMH of PAF, IBS, diverticulosis, DVT on Eliquis, HTN, IDA, HLD, OSA, anxiety and depression who presented to the ED with chief complaints of severe headache.   Per ED reports, patient apparently fell on Monday hitting the back of her head and may have lost consciousness per her brother.  Patient complained of posterior headaches for about 2 to 3 days but refused to seek care.  She went out to lunch today with friends and began complaining of severe headache.  EMS was called and patient transported to the ED.   ED Course: Initial vital signs showed HR of 60 beats/minute, BP 138/70 mm Hg, the RR 18 breaths/minute, and the oxygen saturation 100% on RA and a temperature of 97.39F (36.4C). Patient was noted with change in mental status while waiting in triage.  Per triage nurse, patient suddenly stopped responding and was drooling at the corner of her mouth with some stiffening of her hands and left sided gaze.  She was given 1 mg Ativan IV and immediately taken to a room where stat head CT obtained.    Pertinent Labs/Diagnostics Findings: Unremarkable CMP WBC: 6.4K/L, Hgb/Hct: 6.1/18.3 Plts: 143 CTH> large left subdural hematoma with mass effect on the left lateral ventricles    Given finding as above, neurosurgery was consulted and patient taken emergently to the OR for decompression.  Patient transferred to the ICU postprocedure and PCCM consulted.  2/14: Admitted with traumatic subdural hematoma s/p emergent crani for hematoma evacuation.  Extubated and transferred to the ICU.  PCCM consulted 2/15:POD# 1 Agitated overnight but able to follow commands 2/16: POD# 2 Patient c/o headache worsening in frequency, intensity and  severity. Repeat CTH no evidence of recurrent SDH or stroke. Drain removed, cont with speech difficulty. MRI Brain, EEG ordered 2/17: POD# 3. No significant event overnight. MRI Brain neg.  EEG: This study is suggestive of cortical dysfunction arising from left temporal region likely secondary to underlying sub dural hematoma/ cranioplasty. No seizures or epileptiform discharges were seen throughout the recording.   Procedures:  2/14:Left-sided craniotomy for hematoma evacuation   Assessment and Plan:  Traumatic subdural hematoma with left-to-right midline shift S/p left craniotomy done on 2/14 Drain removed on 2/16 - Continue keppra - ok to resume home Eliquis on POD 14 (2/28) - repeat head CT around 4 weeks post-op. Pt may need an MMA embo in the future as per NeuroSx follow neurosurgery for further recommendation. 2/19: CTH: 1. Stable postoperative changes from left frontoparietal raniotomy for evacuation of a left convexity subdural hematoma with trace residual blood products along the evacuation site. 2. Unchanged interhemispheric subdural hematoma measuring up to 12 mm in thickness. 3. Unchanged minimal rightward midline shift.    #Altered Mental Status #Seizure Activity likely in the setting for tSDH -Repeat imaging as above stable -Seizure precautions -Seizure prophylaxis with Keppra, received loading dose in the ED will continue 500mg  BID -Lorazepam/Versed PRN for breakthrough seizure -EEG and Neurology consult if with recurrent seizure post crani   #Acute Blood Loss Anemia due to tSDH Hx of IDA Hgb 6.1 on presentation s/p 2units PRBCs -Maintain MAP >65 -Monitor for S/Sx of bleeding -Trend CBC (H&H q6h) -SCD's for VTE Prophylaxis (chemical ppx contraindicated) -Transfuse  for Hgb <7  (has received 2 units PRBC's so far)   #Paroxysmal AF rate controlled #HTN #HLD Hx of LVH -Hold diltiazem, spironolactone, Imdur and metoprolol for now while on Nicardipine gtt -Hold  Eliquis -Continue Atorvastatin and Lipitor once able to take po   #Anxiety and Depression -continue home Citalopram  and Lorazepam o   #Hypomagnesemia, mag repleted. Monitor electrolytes.   Body mass index is 26.12 kg/m.  Interventions:  Diet: NG tube feeding DVT Prophylaxis: SCD  Advance goals of care discussion: Full code  Family Communication: family was present at bedside, at the time of interview.  Patient is confused, and AMS,    Disposition:  Pt is from Home, admitted with left subdural hematoma, s/p craniotomy, right arm flaccid.  On NG tube feeding, which precludes a safe discharge. Discharge to SNF, when stable and cleared by neurosurgery..  Subjective: No significant events overnight, patient was hyperactive in the morning and trying to get out of bed so she was given Ativan by RN.  During my exam patient was sleepy, mumbling unable to understand, moving left arm spontaneously.  Unable to answer questions and follow commands. As per family member at bedside who wanted to repeat CT scan of the head which I did and did not show any acute changes, seems stable. We will continue current treatment and monitor.  Physical Exam: General: NAD, sleepy after Ativan  Appear in no distress Eyes: Closed ENT: Oral Mucosa Clear, moist  Neck: no JVD,  Cardiovascular: S1 and S2 Present, no Murmur,  Respiratory: good respiratory effort, Bilateral Air entry equal and Decreased, no Crackles, no wheezes Abdomen: Bowel Sound present, Soft and no tenderness,  Skin: no rashes Extremities: no Pedal edema, no calf tenderness Neurologic: Right arm flaccid paralysis, moving left arm spontaneously, no weakness of lower extremities Gait not checked due to patient safety concerns  Vitals:   06/30/23 1230 06/30/23 1300 06/30/23 1309 06/30/23 1330  BP: (!) 119/51 (!) 159/62  (!) 130/53  Pulse: 62 85 85 69  Resp: (!) 25 15 15  (!) 21  Temp:    98.8 F (37.1 C)  TempSrc:    Axillary  SpO2:  96% 99% 100% 96%  Weight:      Height:        Intake/Output Summary (Last 24 hours) at 06/30/2023 1426 Last data filed at 06/30/2023 1159 Gross per 24 hour  Intake 574.27 ml  Output 550 ml  Net 24.27 ml   Filed Weights   06/28/23 0400 06/29/23 0500 06/30/23 0500  Weight: 67.4 kg 59.8 kg 62.7 kg    Data Reviewed: I have personally reviewed and interpreted daily labs, tele strips, imagings as discussed above. I reviewed all nursing notes, pharmacy notes, vitals, pertinent old records I have discussed plan of care as described above with RN and patient/family.  CBC: Recent Labs  Lab 06/25/23 1333 06/25/23 2331 06/26/23 0615 06/27/23 0244 06/28/23 0320 06/29/23 0509 06/30/23 0727  WBC 6.4   < > 12.0* 11.7* 11.2* 10.0 7.8  NEUTROABS 2.9  --   --   --   --   --   --   HGB 6.1*   < > 12.3 12.4 12.3 12.3 12.5  HCT 18.3*   < > 35.3* 36.4 34.4* 36.4 36.5  MCV 94.8   < > 88.5 89.7 88.4 89.9 89.9  PLT 143*   < > 180 172 206 197 211   < > = values in this interval not displayed.   Basic Metabolic  Panel: Recent Labs  Lab 06/26/23 0615 06/26/23 1507 06/27/23 0244 06/27/23 1308 06/27/23 2015 06/28/23 0320 06/28/23 0815 06/29/23 0509 06/30/23 0727  NA 137   < > 136   < > 135 134*  136 136 135 134*  K 3.6  --  3.4*  --   --  3.8  --  3.4* 3.9  CL 106  --  102  --   --  102  --  102 99  CO2 26  --  22  --   --  22  --  24 25  GLUCOSE 103*  --  87  --   --  71  --  94 125*  BUN 10  --  11  --   --  11  --  11 9  CREATININE 0.67  --  0.63  --   --  0.60  --  0.60 0.63  CALCIUM 8.6*  --  8.5*  --   --  8.1*  --  8.4* 8.9  MG 1.7  --  2.0  --   --  1.8  --  1.8 1.6*  PHOS  --   --   --   --   --   --   --  2.4* 2.6   < > = values in this interval not displayed.    Studies: CT HEAD WO CONTRAST ( ) Result Date: 06/30/2023 CLINICAL DATA:  Follow-up subdural hematoma. EXAM: CT HEAD WITHOUT CONTRAST TECHNIQUE: Contiguous axial images were obtained from the base of the skull  through the vertex without intravenous contrast. RADIATION DOSE REDUCTION: This exam was performed according to the departmental dose-optimization program which includes automated exposure control, adjustment of the mA and/or kV according to patient size and/or use of iterative reconstruction technique. COMPARISON:  Head CT 06/27/2023. FINDINGS: Brain: Stable postoperative changes from left frontoparietal craniotomy for evacuation of a left convexity subdural hematoma with trace residual blood products along the evacuation site. Unchanged interhemispheric subdural hematoma measuring up to 12 mm in thickness (coronal image 39 series 4). Unchanged minimal rightward midline shift. No acute hydrocephalus or new loss of gray-white differentiation. Vascular: No hyperdense vessel or unexpected calcification. Skull: Prior left frontoparietal craniotomy. Sinuses/Orbits: No acute finding. Other: None. IMPRESSION: 1. Stable postoperative changes from left frontoparietal craniotomy for evacuation of a left convexity subdural hematoma with trace residual blood products along the evacuation site. 2. Unchanged interhemispheric subdural hematoma measuring up to 12 mm in thickness. 3. Unchanged minimal rightward midline shift. Electronically Signed   By: Orvan Falconer M.D.   On: 06/30/2023 13:35   DG Abd Portable 1V Result Date: 06/29/2023 CLINICAL DATA:  Check feeding catheter placement EXAM: PORTABLE ABDOMEN - 1 VIEW COMPARISON:  None Available. FINDINGS: Weighted feeding catheter is noted at the level of the floors. No obstructive changes are seen. No free air is noted. IMPRESSION: Weighted feeding catheter as described. Electronically Signed   By: Alcide Clever M.D.   On: 06/29/2023 21:52    Scheduled Meds:  Chlorhexidine Gluconate Cloth  6 each Topical Daily   cholecalciferol  2,000 Units Per Tube Daily   free water  50 mL Per Tube Q4H   heparin injection (subcutaneous)  5,000 Units Subcutaneous Q12H    lipase/protease/amylase)  20,880 Units Per Tube Once   And   sodium bicarbonate  650 mg Per Tube Once   LORazepam  0.5 mg Intravenous Once   metoprolol tartrate  25 mg Per Tube BID   mouth rinse  15 mL Mouth Rinse 4 times per day   QUEtiapine  25 mg Per Tube QHS   Continuous Infusions:  famotidine (PEPCID) IV 20 mg (06/30/23 1157)   feeding supplement (OSMOLITE 1.2 CAL) 40 mL/hr (06/30/23 1343)   levETIRAcetam 500 mg (06/30/23 0948)   PRN Meds: docusate sodium, HYDROmorphone (DILAUDID) injection, ipratropium-albuterol, labetalol, LORazepam, mouth rinse, polyethylene glycol, traZODone  Time spent: 55 minutes  Author: Gillis Santa. MD [facility] 06/30/2023 2:26 PM  To reach On-call, see care teams to locate the attending and reach out to them via [email or web address]. If 7PM-7AM, please contact night-coverage If you still have difficulty reaching the attending provider, please page the [facility] (Director on Call) for [facility] on amion for assistance.

## 2023-06-30 NOTE — Plan of Care (Signed)
  Problem: Clinical Measurements: Goal: Will remain free from infection Outcome: Progressing Goal: Diagnostic test results will improve Outcome: Progressing Goal: Cardiovascular complication will be avoided Outcome: Progressing   Problem: Nutrition: Goal: Adequate nutrition will be maintained Outcome: Progressing   

## 2023-06-30 NOTE — Consult Note (Signed)
PHARMACY CONSULT NOTE - FOLLOW UP  Pharmacy Consult for Electrolyte Monitoring and Replacement   Recent Labs: Potassium (mmol/L)  Date Value  06/30/2023 3.9  11/22/2013 3.3 (L)   Magnesium (mg/dL)  Date Value  13/12/6576 1.6 (L)  11/22/2013 1.7 (L)   Calcium (mg/dL)  Date Value  46/96/2952 8.9   Calcium, Total (mg/dL)  Date Value  84/13/2440 8.7   Albumin (g/dL)  Date Value  03/07/2535 4.1  11/21/2013 3.8   Phosphorus (mg/dL)  Date Value  64/40/3474 2.6   Sodium (mmol/L)  Date Value  06/30/2023 134 (L)  02/27/2020 128 (L)  11/22/2013 138    Assessment: 72yo female who presented with a subdural hematoma with significant PMH of PAF, IBS, diverticulosis, DVT on Eliquis, HTN, IDA, HLD, OSA, anxiety and depression.  Due to ongoing brain compression and symptoms, surgical intervention was recommended. S/p procedure 2/15. Pharmacy was consulted to manage this patient's electrolytes while in the ICU.   Diet: Feeding supplement (Ensure) 237 mL BID  Fluids: NA Medications: NA  Goal of Therapy:  WNL Today, 06/30/2023 K = 3.9 Mg = 1.6 Phos = 2.6  Plan:  Replace with 2g Magnesium sulfate IV x 1 No other replacement indicated at this time Continue to monitor with AM labs   Effie Shy, PharmD Pharmacy Resident  06/30/2023 8:19 AM

## 2023-07-01 DIAGNOSIS — S065XAA Traumatic subdural hemorrhage with loss of consciousness status unknown, initial encounter: Secondary | ICD-10-CM | POA: Diagnosis not present

## 2023-07-01 LAB — CBC
HCT: 35 % — ABNORMAL LOW (ref 36.0–46.0)
Hemoglobin: 12.1 g/dL (ref 12.0–15.0)
MCH: 30.9 pg (ref 26.0–34.0)
MCHC: 34.6 g/dL (ref 30.0–36.0)
MCV: 89.3 fL (ref 80.0–100.0)
Platelets: 216 10*3/uL (ref 150–400)
RBC: 3.92 MIL/uL (ref 3.87–5.11)
RDW: 14.4 % (ref 11.5–15.5)
WBC: 7.6 10*3/uL (ref 4.0–10.5)
nRBC: 0 % (ref 0.0–0.2)

## 2023-07-01 LAB — BASIC METABOLIC PANEL
Anion gap: 10 (ref 5–15)
BUN: 12 mg/dL (ref 8–23)
CO2: 26 mmol/L (ref 22–32)
Calcium: 8.6 mg/dL — ABNORMAL LOW (ref 8.9–10.3)
Chloride: 99 mmol/L (ref 98–111)
Creatinine, Ser: 0.59 mg/dL (ref 0.44–1.00)
GFR, Estimated: >60 mL/min (ref >60)
Glucose, Bld: 117 mg/dL — ABNORMAL HIGH (ref 70–99)
Potassium: 4.2 mmol/L (ref 3.5–5.1)
Sodium: 135 mmol/L (ref 135–145)

## 2023-07-01 LAB — GLUCOSE, CAPILLARY
Glucose-Capillary: 113 mg/dL — ABNORMAL HIGH (ref 70–99)
Glucose-Capillary: 110 mg/dL — ABNORMAL HIGH (ref 70–99)
Glucose-Capillary: 125 mg/dL — ABNORMAL HIGH (ref 70–99)
Glucose-Capillary: 90 mg/dL (ref 70–99)
Glucose-Capillary: 138 mg/dL — ABNORMAL HIGH (ref 70–99)

## 2023-07-01 LAB — MAGNESIUM: Magnesium: 1.8 mg/dL (ref 1.7–2.4)

## 2023-07-01 LAB — PHOSPHORUS: Phosphorus: 3.4 mg/dL (ref 2.5–4.6)

## 2023-07-01 MED ORDER — METOPROLOL TARTRATE 50 MG PO TABS
50.0000 mg | ORAL_TABLET | Freq: Two times a day (BID) | ORAL | Status: DC
  Administered 2023-07-01 – 2023-07-07 (×8): 50 mg
  Filled 2023-07-01 (×10): qty 1

## 2023-07-01 MED ORDER — METOPROLOL TARTRATE 50 MG PO TABS
50.0000 mg | ORAL_TABLET | Freq: Once | ORAL | Status: AC
Start: 2023-07-01 — End: 2023-07-01
  Administered 2023-07-01: 50 mg via ORAL
  Filled 2023-07-01: qty 1

## 2023-07-01 MED ORDER — DOCUSATE SODIUM 50 MG/5ML PO LIQD: 100.0000 mg | Freq: Two times a day (BID) | ORAL | Status: DC | PRN

## 2023-07-01 MED ORDER — CHOLECALCIFEROL 10 MCG/ML (400 UNIT/ML) PO LIQD
4000.0000 [IU] | Freq: Two times a day (BID) | ORAL | Status: DC
  Administered 2023-07-01 – 2023-07-12 (×19): 4000 [IU]
  Filled 2023-07-01 (×22): qty 10

## 2023-07-01 MED ORDER — AMLODIPINE BESYLATE 10 MG PO TABS
10.0000 mg | ORAL_TABLET | Freq: Every day | ORAL | Status: DC
  Administered 2023-07-01: 10 mg via ORAL
  Filled 2023-07-01: qty 1

## 2023-07-01 MED ORDER — MAGNESIUM SULFATE 2 GM/50ML IV SOLN
2.0000 g | Freq: Once | INTRAVENOUS | Status: AC
  Administered 2023-07-01: 2 g via INTRAVENOUS
  Filled 2023-07-01: qty 50

## 2023-07-01 NOTE — Consult Note (Signed)
PHARMACY CONSULT NOTE - FOLLOW UP  Pharmacy Consult for Electrolyte Monitoring and Replacement   Recent Labs: Potassium (mmol/L)  Date Value  07/01/2023 4.2  11/22/2013 3.3 (L)   Magnesium (mg/dL)  Date Value  16/02/9603 1.8  11/22/2013 1.7 (L)   Calcium (mg/dL)  Date Value  54/01/8118 8.6 (L)   Calcium, Total (mg/dL)  Date Value  14/78/2956 8.7   Albumin (g/dL)  Date Value  21/30/8657 4.1  11/21/2013 3.8   Phosphorus (mg/dL)  Date Value  84/69/6295 3.4   Sodium (mmol/L)  Date Value  07/01/2023 135  02/27/2020 128 (L)  11/22/2013 138    Assessment: 72yo female who presented with a subdural hematoma with significant PMH of PAF, IBS, diverticulosis, DVT on Eliquis, HTN, IDA, HLD, OSA, anxiety and depression.  Due to ongoing brain compression and symptoms, surgical intervention was recommended. S/p procedure 2/15. Pharmacy was consulted to manage this patient's electrolytes while in the ICU.   Diet: Feeding supplement (Ensure) 237 mL BID  Fluids: Free water 50 mL every 4 hours  Medications: NA  Goal of Therapy:  WNL Today, 07/01/2023 K = 4.2 Mg = 1.8 Phos = 3.4 Na = 135  Plan:  Replace with 2g Magnesium sulfate IV x 1 (due to history of afib to keep Mg >= 2 ) No other replacement indicated at this time Continue to monitor with AM labs   Effie Shy, PharmD Pharmacy Resident  07/01/2023 5:53 AM

## 2023-07-01 NOTE — Evaluation (Signed)
Speech Language Pathology Evaluation Patient Details Name: [name] MRN: [id number] DOB: 1950-05-30 Today's Date: 07/01/2023 Time: 7829-5621 SLP Time Calculation (min) (ACUTE ONLY): 8 min  Problem List:  Patient Active Problem List   Diagnosis Date Noted   Subdural hematoma (HCC) 06/25/2023   Midline shift of brain with brain compression (HCC) 06/25/2023   Glasgow coma scale total score 9-12 06/25/2023   Acute metabolic encephalopathy 03/01/2022   Anxiety and depression 03/01/2022   Dyslipidemia 03/01/2022   GERD without esophagitis 03/01/2022   SIRS (systemic inflammatory response syndrome) (HCC) 02/28/2022   Gastroenteritis due to norovirus    Nausea vomiting and diarrhea    Hypomagnesemia    Hypokalemia    Hyperlipidemia    Gastroesophageal reflux disease without esophagitis    Intractable vomiting 08/08/2020   Esophagitis on CT 08/08/2020   SVT (supraventricular tachycardia) (HCC) 03/07/2019   Acute respiratory distress 11/16/2017   Acute respiratory failure (HCC) 11/16/2017   Screening for lipid disorders 07/28/2016   DOE (dyspnea on exertion) 07/02/2016   Bradycardia 07/02/2016   Anxiety 07/02/2016   Anxiety state    Atypical chest pain 03/08/2015   Fever 08/12/2010   ABDOMINAL PAIN, LEFT LOWER QUADRANT 06/23/2010   DIVERTICULITIS, ACUTE 02/25/2010   CONSTIPATION 01/22/2010   IRRITABLE BOWEL SYNDROME 01/22/2010   Essential hypertension 01/21/2010   HYPERTENSION, CONTROLLED 01/21/2010   DEEP VENOUS THROMBOPHLEBITIS 01/21/2010   ESOPHAGEAL STRICTURE 02/29/2004   Diverticulosis of colon 02/29/2004   Past Medical History:  Past Medical History:  Diagnosis Date   Bradycardia    Chest pain    Constipation    Depression    Diverticulitis    Diverticulosis of colon    DOE (dyspnea on exertion)    DVT (deep venous thrombosis) (HCC)    Esophageal stricture    Hyperlipemia    Hypertension    IBS (irritable bowel syndrome)    Sleep apnea    Past Surgical  History:  Past Surgical History:  Procedure Laterality Date   APPENDECTOMY     BLOOD CLOT  2004   RIGHT LEG   CHOLECYSTECTOMY     COLON SURGERY     CRANIOTOMY Left 06/25/2023   Procedure: CRANIOTOMY HEMATOMA EVACUATION SUBDURAL;  Surgeon: Venetia Night, MD;  Location: [facility] ORS;  Service: Neurosurgery;  Laterality: Left;   GALLBLADDER SURGERY  2003   NISSEN FUNDOPLICATION     PLANTAR FASCIA SURGERY  2007   sigmoid colectomy  10/21/2010   pelvic anastomosis   VAGINAL HYSTERECTOMY     HPI:  HPI: 73 y.o female with significant PMH of PAF, IBS, diverticulosis, DVT on Eliquis, HTN, IDA, HLD, OSA, anxiety and depression who presented to the ED with chief complaints of severe headache. Per ED reports, patient apparently fell on Monday hitting the back of her head and may have lost consciousness per her brother. Patient complained of posterior headaches for about 2 to 3 days but refused to seek care. She went out to lunch today with friends and began complaining of severe headache. EMS was called and patient transported to the ED. Admitted 2/14 with traumatic left subdural hematoma s/p emergent crani for hematoma evacuation. Extubated and transferred to the ICU. Pt now on 2L O2 and NPO. CT Head 2/15: Resolved midline shift after subdural hematoma evacuation. No complicating features. CXR 2/14: No active disease. GI History: Esophageal stricture, per chart review last EGD with dilation completed on 01/25/23. Repeat Head CT on 06/27/23: No significant reaccumulation of the recently evaluated  subdural hematoma  on the left, on a few coronal slices it is  possible there is trace increase in blood products along the  craniotomy flap. Dominant residual is still left parafalcine at the  frontal parietal junction measuring up to 12 mm in thickness. Trace  midline shift without entrapment, stable on coronal reformats. No  evidence of infarct.  Repeat Head CT on 06/30/2023 revealed 1. Stable postoperative changes  from left frontoparietal craniotomy for evacuation of a left convexity subdural hematoma with trace residual blood products along the evacuation site.  2. Unchanged interhemispheric subdural hematoma measuring up to 12  mm in thickness. 3. Unchanged minimal rightward midline shift.   Assessment / Plan / Recommendation Clinical Impression  Pt presents with increased alertness and despite having eyes closed and just working with PT, she was responsive to verbal cues. Pt continues with what is likely aphasia and perhaps apraxia speech. She presents with delayed responses to verbal information, was able to follow basic verbal direction x 1 when provided with extra time. Her verbal communication is perseverative and neologistic in nature with productions of "ma, ma, ma." Pt does, however, appear aware that this is incorrect but attempts to self-correct yield same production.    SLP Assessment  SLP Recommendation/Assessment: Patient needs continued Speech Lanaguage Pathology Services SLP Visit Diagnosis: Aphasia (R47.01);Apraxia (R48.2)    Recommendations for follow up therapy are one component of a multi-disciplinary discharge planning process, led by the attending physician.  Recommendations may be updated based on patient status, additional functional criteria and insurance authorization.    Follow Up Recommendations  Skilled nursing-short term rehab (<3 hours/day)    Assistance Recommended at Discharge  Frequent or constant Supervision/Assistance  Functional Status Assessment Patient has had a recent decline in their functional status and/or demonstrates limited ability to make significant improvements in function in a reasonable and predictable amount of time  Frequency and Duration min 2x/week  2 weeks      SLP Evaluation Cognition  Overall Cognitive Status: Impaired/Different from baseline Arousal/Alertness: Awake/alert Orientation Level: Oriented to person       Comprehension   Auditory Comprehension Overall Auditory Comprehension: Impaired (as evidenced by difficulty follwing basic verbal instructions)    Expression Expression Primary Mode of Expression: Verbal Verbal Expression Overall Verbal Expression: Impaired Initiation: Impaired Level of Generative/Spontaneous Verbalization: Word Repetition: Impaired Level of Impairment: Word level Naming: Impairment Responsive: 0-25% accurate Confrontation: Impaired Convergent: 0-24% accurate Divergent: 0-24% accurate Verbal Errors: Jargon;Aware of errors Pragmatics: Impairment   Oral / Motor  Oral Motor/Sensory Function Overall Oral Motor/Sensory Function: Within functional limits Motor Speech Overall Motor Speech: Impaired           Kinslie Hove B. Dreama Saa, M.S., CCC-SLP, Tree surgeon Certified Brain Injury Specialist [facility]  [place] [facility] Office [phone] Ascom [phone] Fax [phone]

## 2023-07-01 NOTE — Plan of Care (Signed)
  Problem: Clinical Measurements: Goal: Ability to maintain clinical measurements within normal limits will improve Outcome: Progressing Goal: Will remain free from infection Outcome: Progressing Goal: Diagnostic test results will improve Outcome: Progressing Goal: Respiratory complications will improve Outcome: Progressing Goal: Cardiovascular complication will be avoided Outcome: Progressing   Problem: Health Behavior/Discharge Planning: Goal: Ability to manage health-related needs will improve Outcome: Not Progressing   Problem: Activity: Goal: Risk for activity intolerance will decrease Outcome: Not Progressing   Problem: Coping: Goal: Level of anxiety will decrease Outcome: Not Progressing

## 2023-07-01 NOTE — Progress Notes (Signed)
Physical Therapy Treatment Patient Details Name: OMESHA BOWERMAN MRN: [id number] DOB: 12/24/1950 Today's Date: 07/01/2023   History of Present Illness 73 y.o female with significant PMH of PAF, IBS, diverticulosis, DVT on Eliquis, HTN, IDA, HLD, OSA, anxiety and depression who presented to the ED with chief complaints of severe headache. Admitted with traumatic subdural hematoma s/p craniotomy for evacuation on 06/25/2023.    PT Comments  Patient semi reclined in bed on arrival and awake/alert. Required modA to come towards EOB with good movement of LLE and initiation of movement in R LE. Tolerated sitting EOB x ~15 minutes with CGA-minA. R lateral lean with fatigue but responds to verbal cueing to correct to midline. Able to complete LAQ on L x 5 seated EOB and with max verbal and tactile cueing able to complete x 1 on R. Continues to demonstrate hyperreflexia bilaterally. Able to verbalize last name during session. Discharge plan remains appropriate at this time.     If plan is discharge home, recommend the following: Two people to help with walking and/or transfers;Two people to help with bathing/dressing/bathroom;Assistance with feeding;Direct supervision/assist for medications management;Assistance with cooking/housework;Direct supervision/assist for financial management   Can travel by private vehicle     No  Equipment Recommendations  Other (comment) (TBD)    Recommendations for Other Services       Precautions / Restrictions Precautions Precautions: Fall Recall of Precautions/Restrictions: Impaired Restrictions Weight Bearing Restrictions Per Provider Order: No     Mobility  Bed Mobility Overal bed mobility: Needs Assistance Bed Mobility: Supine to Sit, Sit to Supine     Supine to sit: Mod assist Sit to supine: Max assist   General bed mobility comments: patient able to LLE towards EOB and initiate movement of R LE. Initiated trunk elevation but required assist to  complete    Transfers                   General transfer comment: unsafe to attempt with lack of +2    Ambulation/Gait                   Stairs             Wheelchair Mobility     Tilt Bed    Modified Rankin (Stroke Patients Only)       Balance Overall balance assessment: Needs assistance Sitting-balance support: Feet supported Sitting balance-Leahy Scale: Poor Sitting balance - Comments: external support required, however patient with R lateral lean in sitting and able to correct with verbal and tactile cueing to correct to midline                                    Communication Communication Communication: Impaired Factors Affecting Communication: Difficulty expressing self  Cognition Arousal: Alert Behavior During Therapy: Flat affect   PT - Cognitive impairments: Difficult to assess Difficult to assess due to: Impaired communication                     PT - Cognition Comments: follows commands inconsistently and with increased time and max verbal and tactile cueing. Able to state last name during session and responds yes to Following commands: Impaired Following commands impaired: Follows one step commands inconsistently, Follows one step commands with increased time    Cueing Cueing Techniques: Verbal cues, Tactile cues, Gestural cues, Visual cues  Exercises Other Exercises Other Exercises: seated LAQ  x 5 with L, x 1 with R with max verbal and tactile cueing    General Comments        Pertinent Vitals/Pain Pain Assessment Pain Assessment: CPOT Facial Expression: Relaxed, neutral Body Movements: Absence of movements Muscle Tension: Relaxed Compliance with ventilator (intubated pts.): N/A Vocalization (extubated pts.): Talking in normal tone or no sound CPOT Total: 0 Pain Intervention(s): Monitored during session    Home Living     Available Help at Discharge: Family;Available PRN/intermittently Type  of Home: Mobile home                  Prior Function            PT Goals (current goals can now be found in the care plan section) Acute Rehab PT Goals Patient Stated Goal: patient unable to state PT Goal Formulation: Patient unable to participate in goal setting Time For Goal Achievement: 07/09/23 Potential to Achieve Goals: Fair Progress towards PT goals: Progressing toward goals    Frequency    Min 1X/week      PT Plan      Co-evaluation              AM-PAC PT "6 Clicks" Mobility   Outcome Measure  Help needed turning from your back to your side while in a flat bed without using bedrails?: A Lot Help needed moving from lying on your back to sitting on the side of a flat bed without using bedrails?: A Lot Help needed moving to and from a bed to a chair (including a wheelchair)?: Total Help needed standing up from a chair using your arms (e.g., wheelchair or bedside chair)?: Total Help needed to walk in hospital room?: Total Help needed climbing 3-5 steps with a railing? : Total 6 Click Score: 8    End of Session   Activity Tolerance: Patient tolerated treatment well Patient left: in bed;with call bell/phone within reach;with bed alarm set Nurse Communication: Mobility status PT Visit Diagnosis: Muscle weakness (generalized) (M62.81);Other abnormalities of gait and mobility (R26.89);Hemiplegia and hemiparesis Hemiplegia - Right/Left: Right Hemiplegia - caused by: Other cerebrovascular disease     Time: 1610-9604 PT Time Calculation (min) (ACUTE ONLY): 19 min  Charges:    $Therapeutic Activity: 8-22 mins PT General Charges $$ ACUTE PT VISIT: 1 Visit                     Maylon Peppers, PT, DPT Physical Therapist - [facility]  [facility]    Kayliee Atienza A Madi Bonfiglio 07/01/2023, 1:17 PM

## 2023-07-01 NOTE — Progress Notes (Signed)
Speech Language Pathology Treatment: Dysphagia  Patient Details Name: [name] MRN: [id number] DOB: 12-27-1950 Today's Date: 07/01/2023 Time: 1027-2536 SLP Time Calculation (min) (ACUTE ONLY): 8 min  Assessment / Plan / Recommendation Clinical Impression  Pt seen for ongoing diet management. Per chart, pt with oral holding and downgraded to full liquid diet on 06/30/2023. Pt with intermittent alertness and aroused to her name. Skilled observation was provided of pt consuming ice chips. She was observed with prolonged holding as trials progressed and increased cues required. No overt s/s of aspiration were observed. Pt's nurse asked about medicine crushed in puree with ok provided as long as pt is awake/alert. Education provided to pt's MD on progress. He was present towards end of session.     HPI HPI: HPI: 73 y.o female with significant PMH of PAF, IBS, diverticulosis, DVT on Eliquis, HTN, IDA, HLD, OSA, anxiety and depression who presented to the ED with chief complaints of severe headache. Per ED reports, patient apparently fell on Monday hitting the back of her head and may have lost consciousness per her brother. Patient complained of posterior headaches for about 2 to 3 days but refused to seek care. She went out to lunch today with friends and began complaining of severe headache. EMS was called and patient transported to the ED. Admitted 2/14 with traumatic left subdural hematoma s/p emergent crani for hematoma evacuation. Extubated and transferred to the ICU. Pt now on 2L O2 and NPO. CT Head 2/15: Resolved midline shift after subdural hematoma evacuation. No complicating features. CXR 2/14: No active disease. GI History: Esophageal stricture, per chart review last EGD with dilation completed on 01/25/23. Repeat Head CT on 06/27/23: No significant reaccumulation of the recently evaluated  subdural hematoma on the left, on a few coronal slices it is  possible there is trace increase in blood  products along the  craniotomy flap. Dominant residual is still left parafalcine at the  frontal parietal junction measuring up to 12 mm in thickness. Trace  midline shift without entrapment, stable on coronal reformats. No  evidence of infarct.  Repeat Head CT on 06/30/2023 revealed 1. Stable postoperative changes from left frontoparietal craniotomy for evacuation of a left convexity subdural hematoma with trace residual blood products along the evacuation site.  2. Unchanged interhemispheric subdural hematoma measuring up to 12  mm in thickness. 3. Unchanged minimal rightward midline shift.      SLP Plan  Continue with current plan of care  Patient needs continued Speech Lanaguage Pathology Services   Recommendations for follow up therapy are one component of a multi-disciplinary discharge planning process, led by the attending physician.  Recommendations may be updated based on patient status, additional functional criteria and insurance authorization.    Recommendations  Diet recommendations: Thin liquid Liquids provided via: Cup;Straw Medication Administration: Crushed with puree Supervision: Staff to assist with self feeding;Full supervision/cueing for compensatory strategies Compensations: Minimize environmental distractions;Slow rate;Small sips/bites;Follow solids with liquid Postural Changes and/or Swallow Maneuvers: Seated upright 90 degrees;Upright 30-60 min after meal                  Oral care BID;Oral care before and after PO;Staff/trained caregiver to provide oral care   Frequent or constant Supervision/Assistance Dysphagia, unspecified (R13.10)     Continue with current plan of care    Lerae Langham B. Dreama Saa, M.S., CCC-SLP, Tree surgeon Certified Brain Injury Specialist [facility]  [facility] Rehabilitation Services Office [phone] Ascom [phone] Fax [phone]

## 2023-07-01 NOTE — Plan of Care (Signed)
Problem: Education: Goal: Knowledge of General Education information will improve Description Including pain rating scale, medication(s)/side effects and non-pharmacologic comfort measures Outcome: Progressing   Problem: Health Behavior/Discharge Planning: Goal: Ability to manage health-related needs will improve Outcome: Progressing   Problem: Clinical Measurements: Goal: Ability to maintain clinical measurements within normal limits will improve Outcome: Progressing Goal: Respiratory complications will improve Outcome: Progressing   Problem: Coping: Goal: Level of anxiety will decrease Outcome: Progressing   Problem: Safety: Goal: Ability to remain free from injury will improve Outcome: Progressing

## 2023-07-01 NOTE — Progress Notes (Addendum)
[facility] Progress Note  Patient: [name]    ZOX:[id number]  DOA: 06/25/2023     Date of Service: the patient was seen and examined on 07/01/2023  Chief Complaint  Patient presents with   Headache   Brief hospital course: 73 y.o female with significant PMH of PAF, IBS, diverticulosis, DVT on Eliquis, HTN, IDA, HLD, OSA, anxiety and depression who presented to the ED with chief complaints of severe headache.   Per ED reports, patient apparently fell on Monday hitting the back of her head and may have lost consciousness per her brother.  Patient complained of posterior headaches for about 2 to 3 days but refused to seek care.  She went out to lunch today with friends and began complaining of severe headache.  EMS was called and patient transported to the ED.   ED Course: Initial vital signs showed HR of 60 beats/minute, BP 138/70 mm Hg, the RR 18 breaths/minute, and the oxygen saturation 100% on RA and a temperature of 97.44F (36.4C). Patient was noted with change in mental status while waiting in triage.  Per triage nurse, patient suddenly stopped responding and was drooling at the corner of her mouth with some stiffening of her hands and left sided gaze.  She was given 1 mg Ativan IV and immediately taken to a room where stat head CT obtained.    Pertinent Labs/Diagnostics Findings: Unremarkable CMP WBC: 6.4K/L, Hgb/Hct: 6.1/18.3 Plts: 143 CTH> large left subdural hematoma with mass effect on the left lateral ventricles    Given finding as above, neurosurgery was consulted and patient taken emergently to the OR for decompression.  Patient transferred to the ICU postprocedure and PCCM consulted.  2/14: Admitted with traumatic subdural hematoma s/p emergent crani for hematoma evacuation.  Extubated and transferred to the ICU.  PCCM consulted 2/15:POD# 1 Agitated overnight but able to follow commands 2/16: POD# 2 Patient c/o headache worsening in frequency, intensity and  severity. Repeat CTH no evidence of recurrent SDH or stroke. Drain removed, cont with speech difficulty. MRI Brain, EEG ordered 2/17: POD# 3. No significant event overnight. MRI Brain neg.  EEG: This study is suggestive of cortical dysfunction arising from left temporal region likely secondary to underlying sub dural hematoma/ cranioplasty. No seizures or epileptiform discharges were seen throughout the recording.   Procedures:  2/14:Left-sided craniotomy for hematoma evacuation   Assessment and Plan:  Traumatic subdural hematoma with left-to-right midline shift S/p left craniotomy done on 2/14 Drain removed on 2/16 - Continue keppra - ok to resume home Eliquis on POD 14 (2/28) - repeat head CT around 4 weeks post-op. Pt may need an MMA embo in the future as per NeuroSx follow neurosurgery for further recommendation. 2/19: CTH: 1. Stable postoperative changes from left frontoparietal raniotomy for evacuation of a left convexity subdural hematoma with trace residual blood products along the evacuation site. 2. Unchanged interhemispheric subdural hematoma measuring up to 12 mm in thickness. 3. Unchanged minimal rightward midline shift.    #Altered Mental Status #Seizure Activity likely in the setting for tSDH -Repeat imaging as above stable -Seizure precautions -Seizure prophylaxis with Keppra, received loading dose in the ED will continue 500mg  BID -Lorazepam/Versed PRN for breakthrough seizure -EEG and Neurology consult if with recurrent seizure post crani   #Acute Blood Loss Anemia due to tSDH Hx of IDA Hgb 6.1 on presentation s/p 2units PRBCs -Maintain MAP >65 -Monitor for S/Sx of bleeding -Trend CBC (H&H q6h) -SCD's for VTE Prophylaxis (chemical ppx contraindicated) -Transfuse  for Hgb <7  (has received 2 units PRBC's so far)   #Paroxysmal AF rate controlled #HTN #HLD Hx of LVH -Hold diltiazem, spironolactone, Imdur for now  S/p Nicardipine gtt -Hold Eliquis, resume on  POD 14 (2/28) -Continue Atorvastatin and Lipitor once able to take po 2/19 started metoprolol 25 twice daily 2/20 increased metoprolol 50 mg p.o. twice daily and started amlodipine 10 mg p.o. daily Continue labetalol IV as needed Monitor BP and titrate medication accordingly   #Anxiety and Depression -continue home Citalopram  and Lorazepam    #Hypomagnesemia, mag repleted. Monitor electrolytes.  # Vitamin D deficiency: Started vitamin D oral supplement.  follow with PCP to repeat vitamin D level after 3 to 6 months.   Body mass index is 27.41 kg/m.  Interventions:  Diet: NG tube feeding DVT Prophylaxis: SCD  Advance goals of care discussion: Full code  Family Communication: family was present at bedside, at the time of interview.  Patient is confused, and AMS,    Disposition:  Pt is from Home, admitted with left subdural hematoma, s/p craniotomy, right arm flaccid.  On NG tube feeding, which precludes a safe discharge. Discharge to SNF, when stable and cleared by neurosurgery..  Subjective: No significant events overnight, patient is more awake today, still has altered mental status, expressive dysphagia, unable to tell her name, sometimes she is able to tell her last name as per RN. Her mental status is waxing and waning.   Physical Exam: General: NAD, awake but confused Eyes: PERRLA ENT: Oral Mucosa Clear, moist  Neck: no JVD,  Cardiovascular: S1 and S2 Present, no Murmur,  Respiratory: good respiratory effort, Bilateral Air entry equal and Decreased, no Crackles, no wheezes Abdomen: Bowel Sound present, Soft and no tenderness,  Skin: no rashes Extremities: no Pedal edema, no calf tenderness Neurologic: Right arm flaccid paralysis, moving left arm spontaneously, no weakness of lower extremities Gait not checked due to patient safety concerns  Vitals:   07/01/23 1400 07/01/23 1430 07/01/23 1500 07/01/23 1600  BP: (!) 151/63 (!) 153/62 (!) 154/61 (!) 167/66   Pulse: 81 79 73 69  Resp: 10 14 18 18   Temp:    98.2 F (36.8 C)  TempSrc:    Oral  SpO2: 98% 98% 96% 96%  Weight:      Height:        Intake/Output Summary (Last 24 hours) at 07/01/2023 1631 Last data filed at 07/01/2023 1600 Gross per 24 hour  Intake 1926.32 ml  Output 1550 ml  Net 376.32 ml   Filed Weights   06/29/23 0500 06/30/23 0500 07/01/23 0500  Weight: 59.8 kg 62.7 kg 65.8 kg    Data Reviewed: I have personally reviewed and interpreted daily labs, tele strips, imagings as discussed above. I reviewed all nursing notes, pharmacy notes, vitals, pertinent old records I have discussed plan of care as described above with RN and patient/family.  CBC: Recent Labs  Lab 06/25/23 1333 06/25/23 2331 06/27/23 0244 06/28/23 0320 06/29/23 0509 06/30/23 0727 07/01/23 0439  WBC 6.4   < > 11.7* 11.2* 10.0 7.8 7.6  NEUTROABS 2.9  --   --   --   --   --   --   HGB 6.1*   < > 12.4 12.3 12.3 12.5 12.1  HCT 18.3*   < > 36.4 34.4* 36.4 36.5 35.0*  MCV 94.8   < > 89.7 88.4 89.9 89.9 89.3  PLT 143*   < > 172 206 197 211 216   < > =  values in this interval not displayed.   Basic Metabolic Panel: Recent Labs  Lab 06/27/23 0244 06/27/23 0819 06/28/23 0320 06/28/23 0815 06/29/23 0509 06/30/23 0727 07/01/23 0439  NA 136   < > 134*  136 136 135 134* 135  K 3.4*  --  3.8  --  3.4* 3.9 4.2  CL 102  --  102  --  102 99 99  CO2 22  --  22  --  24 25 26   GLUCOSE 87  --  71  --  94 125* 117*  BUN 11  --  11  --  11 9 12   CREATININE 0.63  --  0.60  --  0.60 0.63 0.59  CALCIUM 8.5*  --  8.1*  --  8.4* 8.9 8.6*  MG 2.0  --  1.8  --  1.8 1.6* 1.8  PHOS  --   --   --   --  2.4* 2.6 3.4   < > = values in this interval not displayed.    Studies: No results found.   Scheduled Meds:  Chlorhexidine Gluconate Cloth  6 each Topical Daily   cholecalciferol  2,000 Units Per Tube Daily   free water  50 mL Per Tube Q4H   heparin injection (subcutaneous)  5,000 Units Subcutaneous Q12H    LORazepam  0.5 mg Intravenous Once   metoprolol tartrate  50 mg Per Tube BID   mouth rinse  15 mL Mouth Rinse 4 times per day   QUEtiapine  25 mg Per Tube QHS   Continuous Infusions:  famotidine (PEPCID) IV Stopped (07/01/23 1139)   feeding supplement (OSMOLITE 1.2 CAL) 1,000 mL (07/01/23 1559)   levETIRAcetam Stopped (07/01/23 0959)   PRN Meds: docusate, HYDROmorphone (DILAUDID) injection, ipratropium-albuterol, labetalol, LORazepam, mouth rinse, polyethylene glycol, traZODone  Time spent: 55 minutes  Author: Gillis Santa. MD [facility] 07/01/2023 4:31 PM  To reach On-call, see care teams to locate the attending and reach out to them via [email or web address]. If 7PM-7AM, please contact night-coverage If you still have difficulty reaching the attending provider, please page the [facility] (Director on Call) for [facility] on amion for assistance.

## 2023-07-02 DIAGNOSIS — S065XAA Traumatic subdural hemorrhage with loss of consciousness status unknown, initial encounter: Secondary | ICD-10-CM | POA: Diagnosis not present

## 2023-07-02 LAB — BASIC METABOLIC PANEL
Anion gap: 8 (ref 5–15)
BUN: 14 mg/dL (ref 8–23)
CO2: 26 mmol/L (ref 22–32)
Calcium: 8.7 mg/dL — ABNORMAL LOW (ref 8.9–10.3)
Chloride: 99 mmol/L (ref 98–111)
Creatinine, Ser: 0.63 mg/dL (ref 0.44–1.00)
GFR, Estimated: >60 mL/min (ref >60)
Glucose, Bld: 107 mg/dL — ABNORMAL HIGH (ref 70–99)
Potassium: 4.4 mmol/L (ref 3.5–5.1)
Sodium: 133 mmol/L — ABNORMAL LOW (ref 135–145)

## 2023-07-02 LAB — URINALYSIS, W/ REFLEX TO CULTURE (INFECTION SUSPECTED)
Bilirubin Urine: NEGATIVE
Glucose, UA: NEGATIVE mg/dL
Ketones, ur: NEGATIVE mg/dL
Nitrite: NEGATIVE
Protein, ur: 30 mg/dL — AB
Specific Gravity, Urine: 1.015 (ref 1.005–1.030)
WBC, UA: >50 WBC/hpf (ref 0–5)
pH: 8 (ref 5.0–8.0)

## 2023-07-02 LAB — CBC
HCT: 35.8 % — ABNORMAL LOW (ref 36.0–46.0)
Hemoglobin: 12.1 g/dL (ref 12.0–15.0)
MCH: 30.4 pg (ref 26.0–34.0)
MCHC: 33.8 g/dL (ref 30.0–36.0)
MCV: 89.9 fL (ref 80.0–100.0)
Platelets: 255 10*3/uL (ref 150–400)
RBC: 3.98 MIL/uL (ref 3.87–5.11)
RDW: 14.2 % (ref 11.5–15.5)
WBC: 8.9 10*3/uL (ref 4.0–10.5)
nRBC: 0 % (ref 0.0–0.2)

## 2023-07-02 LAB — GLUCOSE, CAPILLARY
Glucose-Capillary: 103 mg/dL — ABNORMAL HIGH (ref 70–99)
Glucose-Capillary: 108 mg/dL — ABNORMAL HIGH (ref 70–99)
Glucose-Capillary: 124 mg/dL — ABNORMAL HIGH (ref 70–99)
Glucose-Capillary: 124 mg/dL — ABNORMAL HIGH (ref 70–99)
Glucose-Capillary: 128 mg/dL — ABNORMAL HIGH (ref 70–99)
Glucose-Capillary: 68 mg/dL — ABNORMAL LOW (ref 70–99)
Glucose-Capillary: 99 mg/dL (ref 70–99)

## 2023-07-02 LAB — MAGNESIUM: Magnesium: 1.8 mg/dL (ref 1.7–2.4)

## 2023-07-02 LAB — PHOSPHORUS: Phosphorus: 4.1 mg/dL (ref 2.5–4.6)

## 2023-07-02 MED ORDER — DOCUSATE SODIUM 50 MG/5ML PO LIQD: 100.0000 mg | Freq: Two times a day (BID) | ORAL | Status: DC | PRN

## 2023-07-02 MED ORDER — SODIUM CHLORIDE 0.9 % IV SOLN
1.0000 g | INTRAVENOUS | Status: AC
  Administered 2023-07-02 – 2023-07-06 (×5): 1 g via INTRAVENOUS
  Filled 2023-07-02 (×5): qty 10

## 2023-07-02 MED ORDER — FAMOTIDINE 20 MG PO TABS
20.0000 mg | ORAL_TABLET | Freq: Two times a day (BID) | ORAL | Status: DC
  Administered 2023-07-02 – 2023-07-12 (×19): 20 mg
  Filled 2023-07-02 (×19): qty 1

## 2023-07-02 MED ORDER — POLYETHYLENE GLYCOL 3350 17 G PO PACK
17.0000 g | PACK | Freq: Every day | ORAL | Status: DC | PRN
  Administered 2023-07-06: 17 g
  Filled 2023-07-02: qty 1

## 2023-07-02 MED ORDER — FLUCONAZOLE IN SODIUM CHLORIDE 200-0.9 MG/100ML-% IV SOLN
200.0000 mg | INTRAVENOUS | Status: DC
  Administered 2023-07-02 – 2023-07-05 (×4): 200 mg via INTRAVENOUS
  Filled 2023-07-02 (×5): qty 100

## 2023-07-02 MED ORDER — MAGNESIUM SULFATE 2 GM/50ML IV SOLN
2.0000 g | Freq: Once | INTRAVENOUS | Status: AC
  Administered 2023-07-02: 2 g via INTRAVENOUS
  Filled 2023-07-02: qty 50

## 2023-07-02 MED ORDER — TRAZODONE HCL 50 MG PO TABS
50.0000 mg | ORAL_TABLET | Freq: Every evening | ORAL | Status: DC | PRN
  Administered 2023-07-03 – 2023-07-10 (×7): 50 mg
  Filled 2023-07-02 (×7): qty 1

## 2023-07-02 MED ORDER — AMLODIPINE BESYLATE 10 MG PO TABS
10.0000 mg | ORAL_TABLET | Freq: Every day | ORAL | Status: DC
  Administered 2023-07-02 – 2023-07-07 (×5): 10 mg
  Filled 2023-07-02 (×5): qty 1

## 2023-07-02 NOTE — Progress Notes (Signed)
Neurosurgery Progress Note  History: [name] is here for subdural hematoma s/p craniotomy for evacuation on 06/25/2023.  POD7: Pt transferred to floor. POD5: waxing and waning mental status.  Sleepy today. POD4: RN was told she had an episode of lucidity overnight but has been confused and moaning this morning. Pain appears to have improved with PRNs. Does not appear to be grabbing for her head.  POD3: continues confusion and trouble with speech this morning POD2: She had agitation overnight.  Continues to have difficulty with speech. POD1: She is agitated but has improved compared to pre-op.  Physical Exam: Vitals:   07/02/23 0600 07/02/23 0824  BP:  (!) 132/96  Pulse:  89  Resp:  16  Temp: 98.8 F (37.1 C) 98.9 F (37.2 C)  SpO2:  96%   Oriented to self CNI with  R facial droop  Moving LUE and BLE to pain.   RUE with tone but without movement to pain   Data:  Other tests/results:  CT head 2/16  FINDINGS: Brain: No significant reaccumulation of the recently evaluated subdural hematoma on the left, on a few coronal slices it is possible there is trace increase in blood products along the craniotomy flap. Dominant residual is still left parafalcine at the frontal parietal junction measuring up to 12 mm in thickness. Trace midline shift without entrapment, stable on coronal reformats. No evidence of infarct   Vascular: No hyperdense vessel or unexpected calcification.   Skull: Unremarkable left-sided craniotomy site.   Sinuses/Orbits: Negative   IMPRESSION: Essentially stable postoperative head CT.  No new abnormality.     Electronically Signed   By: Tiburcio Pea M.D.   On: 06/27/2023 04:11  MRI brain 2/16 IMPRESSION: 1. Post-operative changes from prior left subdural hematoma evacuation. The residual subdural hematoma is stable in size as compared to the head CT performed earlier today. As before, the largest component is located along the  posterior falx, measuring 12 mm in thickness. 2. A thin subdural collection (likely hematoma) is also present along the mid and anterior falx, and left cerebral convexity (measuring up to 3 mm in thickness). 3. Mild chronic small vessel ischemic changes within the cerebral white matter and pons. 4. Few chronic microhemorrhages within the left parietal and occipital lobes.     Electronically Signed   By: Jackey Loge D.O.   On: 06/27/2023 17:33  CT Head 06/30/2023 IMPRESSION: 1. Stable postoperative changes from left frontoparietal craniotomy for evacuation of a left convexity subdural hematoma with trace residual blood products along the evacuation site. 2. Unchanged interhemispheric subdural hematoma measuring up to 12 mm in thickness. 3. Unchanged minimal rightward midline shift.     Electronically Signed   By: Orvan Falconer M.D.   On: 06/30/2023 13:35  Assessment/Plan:  [name] has shown some improvement after subdural hematoma evacuation but has continued speech abnormality and RUE paresis. MRI and head CT reassuring.   - mobilize - pain control - minimize narcotics - ok for DVT prophylaxis from neurosurgical standpoint  - PTOTST - ok to start - Continue keppra - ok to resume home Eliquis on POD 14.  - Will plan for repeat head CT around 4 weeks post-op. Pt may need an MMA embo in the future. - Consider PEG if cannot meet caloric needs via PO  - Will follow peripherally through the weekend. Please call with any questions or concerns.    Manning Charity PA-C Department of Neurosurgery

## 2023-07-02 NOTE — Progress Notes (Signed)
Patient brought to the floor on bed by nurse and tech on bed, on room air. Alert and respond to name. Iv intact to left arm. Restarted tube feeding, NG intact to left nostril. Attached to SCD, bed alarm on, tele-monitor in room and plugged in. Made comfortable in bed. Water offered same tolerated.

## 2023-07-02 NOTE — Progress Notes (Addendum)
 Speech Language Pathology Treatment: Dysphagia;Cognitive-Linquistic  Patient Details Name: [name] MRN: [id number] DOB: [birth year] Today's Date: 07/02/2023 Time: 7829-5621 SLP Time Calculation (min) (ACUTE ONLY): 27 min  Assessment / Plan / Recommendation Clinical Impression  This writer received secure chat from pt's nurse stating that pt had gurgling and wetness with POs this morning, therefore he made pt NPO.   Pt presents as alert with frowning, grabbing at her groin area, pulling her left knee into her chest and moaning. She intermittently appears agitated. Pt with urination that appeared to increased her grabbing at her groin and her knee. Urine dark colored (subjectively). When presented with ice chips, pt continued sticking out her tongue and holding out with moaning and frowning. Pt's tongue was observed to be coated and white (increased from previous day with this therapist). Pt did this for 3 trials. Pt's brother and sister-in-law were present. He reports that pt had to have her "throat stretched" every times and the "ice chips might be too big to go down." Pt's medical team made aware of change in pt behavior during today's session. MD ordered urine culture and treatment for potential thrush. ST to follow for cognitive communication and dysphagia treatment.   Order given for ice chips following oral care for promotion of swallow.    HPI HPI: HPI: 73 y.o female with significant PMH of PAF, IBS, diverticulosis, DVT on Eliquis, HTN, IDA, HLD, OSA, anxiety and depression who presented to the ED with chief complaints of severe headache. Per ED reports, patient apparently fell on Monday hitting the back of her head and may have lost consciousness per her brother. Patient complained of posterior headaches for about 2 to 3 days but refused to seek care. She went out to lunch today with friends and began complaining of severe headache. EMS was called and patient transported to the ED.  Admitted 2/14 with traumatic left subdural hematoma s/p emergent crani for hematoma evacuation. Extubated and transferred to the ICU. Pt now on 2L O2 and NPO. CT Head 2/15: Resolved midline shift after subdural hematoma evacuation. No complicating features. CXR 2/14: No active disease. GI History: Esophageal stricture, per chart review last EGD with dilation completed on 01/25/23. Repeat Head CT on 06/27/23: No significant reaccumulation of the recently evaluated  subdural hematoma on the left, on a few coronal slices it is  possible there is trace increase in blood products along the  craniotomy flap. Dominant residual is still left parafalcine at the  frontal parietal junction measuring up to 12 mm in thickness. Trace  midline shift without entrapment, stable on coronal reformats. No  evidence of infarct.  Repeat Head CT on 06/30/2023 revealed 1. Stable postoperative changes from left frontoparietal craniotomy for evacuation of a left convexity subdural hematoma with trace residual blood products along the evacuation site.  2. Unchanged interhemispheric subdural hematoma measuring up to 12  mm in thickness. 3. Unchanged minimal rightward midline shift.      SLP Plan  Continue with current plan of care      Recommendations for follow up therapy are one component of a multi-disciplinary discharge planning process, led by the attending physician.  Recommendations may be updated based on patient status, additional functional criteria and insurance authorization.    Recommendations  Diet recommendations:  (ice chips after oral care) Liquids provided via: Teaspoon Medication Administration: Via alternative means Supervision: Staff to assist with self feeding;Full supervision/cueing for compensatory strategies Compensations: Minimize environmental distractions;Slow rate;Small sips/bites Postural Changes and/or Swallow Maneuvers:  Seated upright 90 degrees;Upright 30-60 min after meal                   Oral care prior to ice chip/H20   Frequent or constant Supervision/Assistance Dysphagia, oral phase (R13.11);Aphasia (R47.01);Apraxia (R48.2);Attention and concentration deficit;Cognitive communication deficit (R41.841)     Continue with current plan of care    Mahitha Hickling B. Dreama Saa, M.S., CCC-SLP, Tree surgeon Certified Brain Injury Specialist [facility]  [facility] Office [phone] Ascom [phone] Fax [phone]

## 2023-07-02 NOTE — Progress Notes (Signed)
Occupational Therapy Treatment Patient Details Name: [name] MRN: [id number] DOB: 1951-04-18 Today's Date: 07/02/2023   History of present illness 73 y.o female with significant PMH of PAF, IBS, diverticulosis, DVT on Eliquis, HTN, IDA, HLD, OSA, anxiety and depression who presented to the ED with chief complaints of severe headache. Admitted with traumatic subdural hematoma s/p craniotomy for evacuation on 06/25/2023.   OT comments  [name] was seen for OT/PT co-treatment on this date. Upon arrival to room pt supine in bed, NT present facilitating self-feeding. Pt remains generally confused/lethargic t/o session. OT/PT facilitated mobility/ADL management with cueing/assist as described below. See ADL section for additional details regarding occupational performance. Pt continues to be functionally limited by decreased cognition, RUE/RLE weakness, decreased activity tolerance and poor balance. Pt is progressing toward OT goals and continues to benefit from skilled OT services to maximize return to PLOF and minimize risk of future falls, injury, caregiver burden, and readmission. Will continue to follow POC as written. Discharge recommendation remains appropriate.        If plan is discharge home, recommend the following:  Two people to help with walking and/or transfers;Two people to help with bathing/dressing/bathroom   Equipment Recommendations   (defer)    Recommendations for Other Services      Precautions / Restrictions Precautions Precautions: Fall Recall of Precautions/Restrictions: Impaired Restrictions Weight Bearing Restrictions Per Provider Order: No       Mobility Bed Mobility Overal bed mobility: Needs Assistance Bed Mobility: Supine to Sit, Sit to Supine     Supine to sit: Max assist Sit to supine: Max assist, +2 for physical assistance   General bed mobility comments: patient initiates most with sitting up on edge of bed with most assistance required for  her right side. intermittent +2 person assistance required. multi modal cues required    Transfers Overall transfer level: Needs assistance Equipment used: 2 person hand held assist Transfers: Sit to/from Stand Sit to Stand: Total assist, +2 physical assistance           General transfer comment: 2 person assistance required for standing at edge of bed. faciliation for anterior weight shifting, lifting and lowering assistance needed.     Balance Overall balance assessment: Needs assistance Sitting-balance support: Feet supported Sitting balance-[name] Scale: Zero Sitting balance - Comments: external support required despite cues for midline. right and posterior lean Postural control: Right lateral lean, Posterior lean Standing balance support: Bilateral upper extremity supported Standing balance-[name] Scale: Zero Standing balance comment: +2 person assistance reuqired.                           ADL either performed or assessed with clinical judgement   ADL Overall ADL's : Needs assistance/impaired Eating/Feeding: Sitting;Maximal assistance Eating/Feeding Details (indicate cue type and reason): +2 assist to maintain static sitting at EOB with MAX A to support loading spoon and to bring food to mouth using LUE. Pt does attempt to grip spoon and scoop bites on her own with poor coordination appreciated and decreased accuracy with bringing hand to mouth. Grooming: Sitting;Wash/dry face;Maximal assistance Grooming Details (indicate cue type and reason): +2 assist to maintain static sitting at EOB     Lower Body Bathing: Total assistance;Bed level Lower Body Bathing Details (indicate cue type and reason): TOTAL A to don bilat socks at bed level.                     Functional mobility  during ADLs: +2 for physical assistance;Total assistance General ADL Comments: Attempted STS from EOB, pt requires MAX +2 for bed mobility and TOTAL A +2 to maintain static standing  at EOB. Pt does attempt to reach outside BOS to counter when standing with asssit. Edu limited by pt cognition.    Extremity/Trunk Assessment Upper Extremity Assessment RUE Deficits / Details: grossly 0/5, generally flaccid with mild tone noted at end range during PROM. Pt at high risk for subluxation. Will continue to monitor   Lower Extremity Assessment RLE Coordination: decreased gross motor   Cervical / Trunk Assessment Cervical / Trunk Assessment: Normal    Vision Patient Visual Report: No change from baseline     Perception     Praxis     Communication Communication Communication: Impaired Factors Affecting Communication: Difficulty expressing self   Cognition Arousal: Alert Behavior During Therapy: Flat affect Cognition: Difficult to assess, Cognition impaired Difficult to assess due to: Level of arousal           OT - Cognition Comments: limited vocalizations during session, generally only states "mama" does not answer questions or consistently follow VCs during session.                 Following commands: Impaired Following commands impaired: Follows one step commands inconsistently      Cueing   Cueing Techniques: Gestural cues, Verbal cues, Tactile cues, Visual cues  Exercises Other Exercises Other Exercises: OT facilitated seated ADL management at EOB.    Shoulder Instructions       General Comments      Pertinent Vitals/ Pain       Pain Assessment Pain Assessment: PAINAD Breathing: normal Negative Vocalization: none Facial Expression: sad, frightened, frown Body Language: tense, distressed pacing, fidgeting Consolability: no need to console PAINAD Score: 2 Pain Intervention(s): Monitored during session  Home Living                                          Prior Functioning/Environment              Frequency  Min 1X/week        Progress Toward Goals  OT Goals(current goals can now be found in the care  plan section)  Progress towards OT goals: Progressing toward goals  Acute Rehab OT Goals Patient Stated Goal: to return to PLOF OT Goal Formulation: With family Time For Goal Achievement: 07/11/23 Potential to Achieve Goals: Fair  Plan      Co-evaluation    PT/OT/SLP Co-Evaluation/Treatment: Yes Reason for Co-Treatment: To address functional/ADL transfers;Necessary to address cognition/behavior during functional activity;For patient/therapist safety;Complexity of the patient's impairments (multi-system involvement) PT goals addressed during session: Mobility/safety with mobility;Balance OT goals addressed during session: ADL's and self-care      AM-PAC OT "6 Clicks" Daily Activity     Outcome Measure   Help from another person eating meals?: Total Help from another person taking care of personal grooming?: Total Help from another person toileting, which includes using toliet, bedpan, or urinal?: Total Help from another person bathing (including washing, rinsing, drying)?: Total Help from another person to put on and taking off regular upper body clothing?: Total Help from another person to put on and taking off regular lower body clothing?: Total 6 Click Score: 6    End of Session    OT Visit Diagnosis: Other abnormalities of gait and mobility (  R26.89);Muscle weakness (generalized) (M62.81)   Activity Tolerance Patient tolerated treatment well   Patient Left in bed;with call bell/phone within reach;with bed alarm set   Nurse Communication Mobility status        Time: 1610-9604 OT Time Calculation (min): 34 min  Charges: OT General Charges $OT Visit: 1 Visit OT Treatments $Self Care/Home Management : 8-22 mins  Rockney Ghee, M.S., OTR/L 07/02/23, 12:53 PM

## 2023-07-02 NOTE — Progress Notes (Signed)
   07/02/23 1245  Spiritual Encounters  Type of Visit Initial  Care provided to: Family  Referral source Chaplain assessment  Reason for visit Routine spiritual support  OnCall Visit No   Chaplain saw family member sitting in the lobby and engaged in conversation. Chaplain offered a compassionate presence, reflective listening and nourishment.    Rev. Rana M. Benard Rink Chaplain [facility]ident [facility]

## 2023-07-02 NOTE — Progress Notes (Signed)
[facility] Progress Note  Patient: [name]    ZOX:[id number]  DOA: 06/25/2023     Date of Service: the patient was seen and examined on 07/02/2023  Chief Complaint  Patient presents with   Headache   Brief hospital course: 73 y.o female with significant PMH of PAF, IBS, diverticulosis, DVT on Eliquis, HTN, IDA, HLD, OSA, anxiety and depression who presented to the ED with chief complaints of severe headache.   Per ED reports, patient apparently fell on Monday hitting the back of her head and may have lost consciousness per her brother.  Patient complained of posterior headaches for about 2 to 3 days but refused to seek care.  She went out to lunch today with friends and began complaining of severe headache.  EMS was called and patient transported to the ED.   ED Course: Initial vital signs showed HR of 60 beats/minute, BP 138/70 mm Hg, the RR 18 breaths/minute, and the oxygen saturation 100% on RA and a temperature of 97.42F (36.4C). Patient was noted with change in mental status while waiting in triage.  Per triage nurse, patient suddenly stopped responding and was drooling at the corner of her mouth with some stiffening of her hands and left sided gaze.  She was given 1 mg Ativan IV and immediately taken to a room where stat head CT obtained.    Pertinent Labs/Diagnostics Findings: Unremarkable CMP WBC: 6.4K/L, Hgb/Hct: 6.1/18.3 Plts: 143 CTH> large left subdural hematoma with mass effect on the left lateral ventricles    Given finding as above, neurosurgery was consulted and patient taken emergently to the OR for decompression.  Patient transferred to the ICU postprocedure and PCCM consulted.  2/14: Admitted with traumatic subdural hematoma s/p emergent crani for hematoma evacuation.  Extubated and transferred to the ICU.  PCCM consulted 2/15:POD# 1 Agitated overnight but able to follow commands 2/16: POD# 2 Patient c/o headache worsening in frequency, intensity and  severity. Repeat CTH no evidence of recurrent SDH or stroke. Drain removed, cont with speech difficulty. MRI Brain, EEG ordered 2/17: POD# 3. No significant event overnight. MRI Brain neg.  EEG: This study is suggestive of cortical dysfunction arising from left temporal region likely secondary to underlying sub dural hematoma/ cranioplasty. No seizures or epileptiform discharges were seen throughout the recording.   Procedures:  2/14:Left-sided craniotomy for hematoma evacuation   Assessment and Plan:  # Traumatic subdural hematoma with left-to-right midline shift S/p left craniotomy done on 2/14 Drain removed on 2/16 - Continue keppra - ok to resume home Eliquis on POD 14 (2/28) - repeat head CT around 4 weeks post-op. Pt may need an MMA embo in the future as per NeuroSx 2/19: CTH: 1. Stable postoperative changes from left frontoparietal raniotomy for evacuation of a left convexity subdural hematoma with trace residual blood products along the evacuation site. 2. Unchanged interhemispheric subdural hematoma measuring up to 12 mm in thickness. 3. Unchanged minimal rightward midline shift. follow neurosurgery for further recommendation.   # UTI 2/21 UA positive Started ceftriaxone 1 g IV daily Follow urine culture and blood culture  # Oral thrush 2/21 Started diabetic and IV, switch to oral via tube after PEG tube insertion   #Altered Mental Status #Seizure Activity likely in the setting for tSDH -Repeat imaging as above stable -Seizure precautions -Seizure prophylaxis with Keppra, received loading dose in the ED will continue 500mg  BID -Lorazepam/Versed PRN for breakthrough seizure Patient was seen by neurology.   #Acute Blood Loss Anemia due  to SDH Hx of IDA Hgb 6.1 on presentation s/p 2units PRBCs -Maintain MAP >65 -Monitor for S/Sx of bleeding -Trend CBC (H&H q6h) -SCD's for VTE Prophylaxis (chemical ppx contraindicated) -Transfuse for Hgb <7  (has received 2 units PRBC's  so far)   #Paroxysmal AF rate controlled #HTN #HLD Hx of LVH -Hold diltiazem, spironolactone, Imdur for now  S/p Nicardipine gtt -Hold Eliquis, resume on POD 14 (2/28) -Continue Atorvastatin and Lipitor once able to take po 2/19 started metoprolol 25 twice daily 2/20 increased metoprolol 50 mg p.o. twice daily and started amlodipine 10 mg p.o. daily Continue labetalol IV as needed Monitor BP and titrate medication accordingly   #Anxiety and Depression -continue home Citalopram  and Lorazepam    #Hypomagnesemia, mag repleted. Monitor electrolytes.  # Vitamin D deficiency: Started vitamin D oral supplement.  follow with PCP to repeat vitamin D level after 3 to 6 months.  # Dysphagia secondary to subdural hemorrhage Continue NG feeding SLP and nutritionist following 2/21 discussed with patient's family, agreed for PEG tube placement Secure chat/text sent to IR to keep her on the schedule for PEG tube on Monday   Body mass index is 27.41 kg/m.  Interventions:  Diet: NG tube feeding DVT Prophylaxis: SCD  Advance goals of care discussion: Full code  Family Communication: family was present at bedside, at the time of interview.  Patient is confused, and AMS,    Disposition:  Pt is from Home, admitted with left subdural hematoma, s/p craniotomy, right arm flaccid.  On NG tube feeding, which precludes a safe discharge. Discharge to SNF, after PEG tube insertion and diet tolerance. Follow TOC for placement  Subjective: No significant events overnight, patient is more awake today but also not able to speak, unable to follow command.  Patient remained fused.   Physical Exam: General: NAD, awake but confused Eyes: PERRLA ENT: Oral Mucosa moist and some whitish spots visible, possible oral thrush Neck: no JVD,  Cardiovascular: S1 and S2 Present, no Murmur,  Respiratory: good respiratory effort, Bilateral Air entry equal and Decreased, no Crackles, no wheezes Abdomen:  Bowel Sound present, Soft and no tenderness,  Skin: no rashes Extremities: no Pedal edema, no calf tenderness Neurologic: Right arm flaccid paralysis, moving left arm spontaneously, no weakness of lower extremities Gait not checked due to patient safety concerns  Vitals:   07/02/23 0337 07/02/23 0600 07/02/23 0824 07/02/23 1152  BP: (!) 118/52  (!) 132/96 (!) 127/51  Pulse: 82  89 80  Resp: 18  16 20   Temp: 99.5 F (37.5 C) 98.8 F (37.1 C) 98.9 F (37.2 C) 99.2 F (37.3 C)  TempSrc: Oral Oral    SpO2: 94%  96% 98%  Weight:      Height:        Intake/Output Summary (Last 24 hours) at 07/02/2023 1507 Last data filed at 07/02/2023 1441 Gross per 24 hour  Intake 340 ml  Output 1300 ml  Net -960 ml   Filed Weights   06/29/23 0500 06/30/23 0500 07/01/23 0500  Weight: 59.8 kg 62.7 kg 65.8 kg    Data Reviewed: I have personally reviewed and interpreted daily labs, tele strips, imagings as discussed above. I reviewed all nursing notes, pharmacy notes, vitals, pertinent old records I have discussed plan of care as described above with RN and patient/family.  CBC: Recent Labs  Lab 06/28/23 0320 06/29/23 0509 06/30/23 0727 07/01/23 0439 07/02/23 0536  WBC 11.2* 10.0 7.8 7.6 8.9  HGB 12.3 12.3 12.5 12.1  12.1  HCT 34.4* 36.4 36.5 35.0* 35.8*  MCV 88.4 89.9 89.9 89.3 89.9  PLT 206 197 211 216 255   Basic Metabolic Panel: Recent Labs  Lab 06/28/23 0320 06/28/23 0815 06/29/23 0509 06/30/23 0727 07/01/23 0439 07/02/23 0536  NA 134*  136 136 135 134* 135 133*  K 3.8  --  3.4* 3.9 4.2 4.4  CL 102  --  102 99 99 99  CO2 22  --  24 25 26 26   GLUCOSE 71  --  94 125* 117* 107*  BUN 11  --  11 9 12 14   CREATININE 0.60  --  0.60 0.63 0.59 0.63  CALCIUM 8.1*  --  8.4* 8.9 8.6* 8.7*  MG 1.8  --  1.8 1.6* 1.8 1.8  PHOS  --   --  2.4* 2.6 3.4 4.1    Studies: No results found.   Scheduled Meds:  amLODipine  10 mg Per Tube Daily   Chlorhexidine Gluconate Cloth  6 each  Topical Daily   cholecalciferol  4,000 Units Per Tube BID   famotidine  20 mg Per Tube BID   free water  50 mL Per Tube Q4H   heparin injection (subcutaneous)  5,000 Units Subcutaneous Q12H   metoprolol tartrate  50 mg Per Tube BID   mouth rinse  15 mL Mouth Rinse 4 times per day   QUEtiapine  25 mg Per Tube QHS   Continuous Infusions:  cefTRIAXone (ROCEPHIN)  IV     feeding supplement (OSMOLITE 1.2 CAL) 60 mL/hr at 07/01/23 1800   fluconazole (DIFLUCAN) IV 200 mg (07/02/23 1240)   levETIRAcetam 500 mg (07/02/23 1018)   PRN Meds: docusate, HYDROmorphone (DILAUDID) injection, ipratropium-albuterol, labetalol, LORazepam, mouth rinse, polyethylene glycol, traZODone  Time spent: 55 minutes  Author: Gillis Santa. MD [facility] 07/02/2023 3:07 PM  To reach On-call, see care teams to locate the attending and reach out to them via [email or web address]. If 7PM-7AM, please contact night-coverage If you still have difficulty reaching the attending provider, please page the [facility] (Director on Call) for [facility] on amion for assistance.

## 2023-07-02 NOTE — Progress Notes (Signed)
Physical Therapy Treatment Patient Details Name: [name] MRN: [id number] DOB: 1951-01-20 Today's Date: 07/02/2023   History of Present Illness 73 y.o female with significant PMH of PAF, IBS, diverticulosis, DVT on Eliquis, HTN, IDA, HLD, OSA, anxiety and depression who presented to the ED with chief complaints of severe headache. Admitted with traumatic subdural hematoma s/p craniotomy for evacuation on 06/25/2023.    PT Comments  Patient is alert on arrival to room. Multi modal cues required during functional mobility. Patient was able to stand today with +2 person assistance with standing tolerance less than 30 seconds. She continues to require assistance with all mobility. Recommend to continue PT to maximize independence and decrease caregiver burden.    If plan is discharge home, recommend the following: Two people to help with walking and/or transfers;Two people to help with bathing/dressing/bathroom;Assistance with feeding;Direct supervision/assist for medications management;Assistance with cooking/housework;Direct supervision/assist for financial management   Can travel by private vehicle     No  Equipment Recommendations   (to be determined at next level of care)    Recommendations for Other Services       Precautions / Restrictions Precautions Precautions: Fall Recall of Precautions/Restrictions: Impaired Restrictions Weight Bearing Restrictions Per Provider Order: No     Mobility  Bed Mobility Overal bed mobility: Needs Assistance Bed Mobility: Supine to Sit, Sit to Supine     Supine to sit: Max assist Sit to supine: Max assist, +2 for physical assistance   General bed mobility comments: patient initiates most with sitting up on edge of bed with most assistance required for her right side. intermittent +2 person assistance required. multi modal cues required    Transfers Overall transfer level: Needs assistance Equipment used: 2 person hand held  assist Transfers: Sit to/from Stand Sit to Stand: Total assist, +2 physical assistance           General transfer comment: 2 person assistance required for standing at edge of bed. faciliation for anterior weight shifting, lifting and lowering assistance needed.    Ambulation/Gait             Pre-gait activities: emphasis on posture and increased standing tolerance. standing tolerance of around 30 seconds General Gait Details: unable to walk safely at this time.   Stairs             Wheelchair Mobility     Tilt Bed    Modified Rankin (Stroke Patients Only)       Balance Overall balance assessment: Needs assistance Sitting-balance support: Feet supported Sitting balance-Leahy Scale: Poor Sitting balance - Comments: external support required despite cues for midline. right and posterior lean Postural control: Right lateral lean, Posterior lean Standing balance support: Bilateral upper extremity supported Standing balance-Leahy Scale: Zero Standing balance comment: +2 person assistance reuqired.                            Communication Communication Communication: Impaired Factors Affecting Communication: Difficulty expressing self  Cognition Arousal: Alert Behavior During Therapy: Flat affect   PT - Cognitive impairments: Difficult to assess Difficult to assess due to: Impaired communication                     PT - Cognition Comments: multi modal cues required for command following. mostly non veral Following commands: Impaired Following commands impaired: Follows one step commands inconsistently    Cueing Cueing Techniques: Gestural cues, Verbal cues, Tactile cues, Visual cues  Exercises      General Comments        Pertinent Vitals/Pain Pain Assessment Pain Assessment: PAINAD Breathing: normal Negative Vocalization: none Facial Expression: sad, frightened, frown Body Language: tense, distressed pacing,  fidgeting Consolability: no need to console PAINAD Score: 2    Home Living                          Prior Function            PT Goals (current goals can now be found in the care plan section) Acute Rehab PT Goals Patient Stated Goal: patient unable to state PT Goal Formulation: Patient unable to participate in goal setting Time For Goal Achievement: 07/09/23 Potential to Achieve Goals: Fair Progress towards PT goals: Progressing toward goals    Frequency    Min 1X/week      PT Plan      Co-evaluation PT/OT/SLP Co-Evaluation/Treatment: Yes Reason for Co-Treatment: To address functional/ADL transfers;Necessary to address cognition/behavior during functional activity;For patient/therapist safety;Complexity of the patient's impairments (multi-system involvement) PT goals addressed during session: Mobility/safety with mobility;Balance OT goals addressed during session: ADL's and self-care      AM-PAC PT "6 Clicks" Mobility   Outcome Measure  Help needed turning from your back to your side while in a flat bed without using bedrails?: A Lot Help needed moving from lying on your back to sitting on the side of a flat bed without using bedrails?: A Lot Help needed moving to and from a bed to a chair (including a wheelchair)?: Total Help needed standing up from a chair using your arms (e.g., wheelchair or bedside chair)?: Total Help needed to walk in hospital room?: Total Help needed climbing 3-5 steps with a railing? : Total 6 Click Score: 8    End of Session   Activity Tolerance: Patient limited by fatigue Patient left: in bed;with call bell/phone within reach;with bed alarm set Nurse Communication: Mobility status PT Visit Diagnosis: Muscle weakness (generalized) (M62.81);Other abnormalities of gait and mobility (R26.89);Hemiplegia and hemiparesis     Time: 4098-1191 PT Time Calculation (min) (ACUTE ONLY): 33 min  Charges:    $Therapeutic Activity:  8-22 mins PT General Charges $$ ACUTE PT VISIT: 1 Visit                     Donna Bernard, PT, MPT    Ina Homes 07/02/2023, 11:07 AM

## 2023-07-02 NOTE — Plan of Care (Signed)
  Problem: Education: Goal: Knowledge of General Education information will improve Description: Including pain rating scale, medication(s)/side effects and non-pharmacologic comfort measures Outcome: Progressing   Problem: Clinical Measurements: Goal: Ability to maintain clinical measurements within normal limits will improve Outcome: Progressing   Problem: Activity: Goal: Risk for activity intolerance will decrease Outcome: Progressing   Problem: Nutrition: Goal: Adequate nutrition will be maintained Outcome: Progressing   Problem: Coping: Goal: Level of anxiety will decrease Outcome: Progressing   Problem: Elimination: Goal: Will not experience complications related to bowel motility Outcome: Progressing   Problem: Pain Managment: Goal: General experience of comfort will improve and/or be controlled Outcome: Progressing   Problem: Safety: Goal: Ability to remain free from injury will improve Outcome: Progressing   Problem: Skin Integrity: Goal: Risk for impaired skin integrity will decrease Outcome: Progressing

## 2023-07-02 NOTE — TOC Progression Note (Signed)
Transition of Care [facility]) - Progression Note    Patient Details  Name: [name] MRN: [id number] Date of Birth: August 02, 1950  Transition of [facility]) CM/SW Contact  Allena Katz, LCSW Phone Number: 07/02/2023, 10:05 AM  Clinical Narrative:    Pt still has a Comptroller. TOC following.   Expected Discharge Plan: Skilled Nursing Facility Barriers to Discharge: Continued Medical Work up  Expected Discharge Plan and Services     Post Acute Care Choice: IP Rehab, Skilled Nursing Facility Living arrangements for the past 2 months: Single Family Home                                       Social Determinants of Health (SDOH) Interventions SDOH Screenings   Food Insecurity: No Food Insecurity (03/29/2023)   Received from [facility]  Housing: Low Risk  (05/26/2023)   Received from [facility]  Transportation Needs: No Transportation Needs (03/29/2023)   Received from [facility]  Utilities: Not At Risk (03/29/2023)   Received from [facility]  Financial Resource Strain: Low Risk  (03/29/2023)   Received from [facility]  Tobacco Use: Low Risk  (06/25/2023)    Readmission Risk Interventions     No data to display

## 2023-07-02 NOTE — Consult Note (Signed)
PHARMACY CONSULT NOTE - FOLLOW UP  Pharmacy Consult for Electrolyte Monitoring and Replacement   Recent Labs: Potassium (mmol/L)  Date Value  07/02/2023 4.4  11/22/2013 3.3 (L)   Magnesium (mg/dL)  Date Value  91/47/8295 1.8  11/22/2013 1.7 (L)   Calcium (mg/dL)  Date Value  62/13/0865 8.7 (L)   Calcium, Total (mg/dL)  Date Value  78/46/9629 8.7   Albumin (g/dL)  Date Value  52/84/1324 4.1  11/21/2013 3.8   Phosphorus (mg/dL)  Date Value  40/02/2724 4.1   Sodium (mmol/L)  Date Value  07/02/2023 133 (L)  02/27/2020 128 (L)  11/22/2013 138    Assessment: 73yo female who presented with a subdural hematoma with significant PMH of PAF, IBS, diverticulosis, DVT on Eliquis, HTN, IDA, HLD, OSA, anxiety and depression.  Due to ongoing brain compression and symptoms, surgical intervention was recommended. S/p procedure 2/15. Pharmacy was consulted to manage this patient's electrolytes while in the ICU.   Diet: Feeding supplement (Ensure) 237 mL BID  Fluids: Free water 50 mLper tube every 4 hours  Medications: NA  Goal of Therapy:  WNL  Plan:  Mag 1.8  Will order 2g Magnesium sulfate IV x 1 (due to history of afib to for goal Mg >= 2 ) No other replacement indicated at this time Continue to monitor with AM labs   Bari Mantis PharmD Clinical Pharmacist 07/02/2023

## 2023-07-03 DIAGNOSIS — N3000 Acute cystitis without hematuria: Secondary | ICD-10-CM

## 2023-07-03 DIAGNOSIS — I10 Essential (primary) hypertension: Secondary | ICD-10-CM | POA: Diagnosis not present

## 2023-07-03 DIAGNOSIS — R569 Unspecified convulsions: Secondary | ICD-10-CM

## 2023-07-03 DIAGNOSIS — S065XAA Traumatic subdural hemorrhage with loss of consciousness status unknown, initial encounter: Secondary | ICD-10-CM | POA: Diagnosis not present

## 2023-07-03 HISTORY — DX: Acute cystitis without hematuria: N30.00

## 2023-07-03 LAB — BASIC METABOLIC PANEL
Anion gap: 8 (ref 5–15)
BUN: 17 mg/dL (ref 8–23)
CO2: 25 mmol/L (ref 22–32)
Calcium: 9.1 mg/dL (ref 8.9–10.3)
Chloride: 102 mmol/L (ref 98–111)
Creatinine, Ser: 0.68 mg/dL (ref 0.44–1.00)
GFR, Estimated: >60 mL/min (ref >60)
Glucose, Bld: 103 mg/dL — ABNORMAL HIGH (ref 70–99)
Potassium: 4.1 mmol/L (ref 3.5–5.1)
Sodium: 135 mmol/L (ref 135–145)

## 2023-07-03 LAB — GLUCOSE, CAPILLARY
Glucose-Capillary: 103 mg/dL — ABNORMAL HIGH (ref 70–99)
Glucose-Capillary: 110 mg/dL — ABNORMAL HIGH (ref 70–99)
Glucose-Capillary: 112 mg/dL — ABNORMAL HIGH (ref 70–99)
Glucose-Capillary: 146 mg/dL — ABNORMAL HIGH (ref 70–99)
Glucose-Capillary: 79 mg/dL (ref 70–99)
Glucose-Capillary: 79 mg/dL (ref 70–99)
Glucose-Capillary: 83 mg/dL (ref 70–99)

## 2023-07-03 LAB — CBC
HCT: 38.2 % (ref 36.0–46.0)
Hemoglobin: 12.9 g/dL (ref 12.0–15.0)
MCH: 30.6 pg (ref 26.0–34.0)
MCHC: 33.8 g/dL (ref 30.0–36.0)
MCV: 90.7 fL (ref 80.0–100.0)
Platelets: 292 10*3/uL (ref 150–400)
RBC: 4.21 MIL/uL (ref 3.87–5.11)
RDW: 14.4 % (ref 11.5–15.5)
WBC: 11.3 10*3/uL — ABNORMAL HIGH (ref 4.0–10.5)
nRBC: 0 % (ref 0.0–0.2)

## 2023-07-03 LAB — PHOSPHORUS: Phosphorus: 4 mg/dL (ref 2.5–4.6)

## 2023-07-03 LAB — MAGNESIUM: Magnesium: 2 mg/dL (ref 1.7–2.4)

## 2023-07-03 MED ORDER — DEXTROSE 50 % IV SOLN
12.5000 g | INTRAVENOUS | Status: AC
  Administered 2023-07-03: 12.5 g via INTRAVENOUS
  Filled 2023-07-03: qty 50

## 2023-07-03 MED ORDER — DEXTROSE 5 % IV SOLN: INTRAVENOUS | Status: DC

## 2023-07-03 MED ORDER — LORAZEPAM 2 MG/ML IJ SOLN: 0.5000 mg | Freq: Once | INTRAMUSCULAR | Status: DC

## 2023-07-03 NOTE — Progress Notes (Signed)
 Speech Language Pathology Treatment: Dysphagia;Cognitive-Linquistic  Patient Details Name: [name] MRN: [id number] DOB: [birth year] Today's Date: 07/03/2023 Time: 6213-0865 SLP Time Calculation (min) (ACUTE ONLY): 25 min  Assessment / Plan / Recommendation Clinical Impression  Pt seen for follow up intervention. SLP with goal to complete speech language eval this date; however, pt asleep upon entrance to room, resting comfortably. Therefore, session focus shifted to education for family (sister in law) in room. Education shared regarding SLP course in past week for dysphagia, with current recommendation for NPO based on oral deficits and fluctuations in alertness. Further education shared regarding impact of current medical profile (UTI, thrush) on appetite/oral intake and cognition/alertness. Sister in law reported concern in patient's fluctuations in speech- specifically inability to get words out and pt's frustration with speech. Active listening and reinforcement provided for plan for continued SLP intervention both in the acute and rehab setting. All questions answered. SLP to continue to follow.   Recommend continued frequent oral care. Provide ice chips when alert and sitting upright for pharyngeal stimulation and comfort.    HPI HPI: HPI: 73 y.o female with significant PMH of PAF, IBS, diverticulosis, DVT on Eliquis, HTN, IDA, HLD, OSA, anxiety and depression who presented to the ED with chief complaints of severe headache. Per ED reports, patient apparently fell on Monday hitting the back of her head and may have lost consciousness per her brother. Patient complained of posterior headaches for about 2 to 3 days but refused to seek care. She went out to lunch today with friends and began complaining of severe headache. EMS was called and patient transported to the ED. Admitted 2/14 with traumatic left subdural hematoma s/p emergent crani for hematoma evacuation. Extubated and transferred  to the ICU. Pt now on 2L O2 and NPO. CT Head 2/15: Resolved midline shift after subdural hematoma evacuation. No complicating features. CXR 2/14: No active disease. GI History: Esophageal stricture, per chart review last EGD with dilation completed on 01/25/23. Repeat Head CT on 06/27/23: No significant reaccumulation of the recently evaluated  subdural hematoma on the left, on a few coronal slices it is  possible there is trace increase in blood products along the  craniotomy flap. Dominant residual is still left parafalcine at the  frontal parietal junction measuring up to 12 mm in thickness. Trace  midline shift without entrapment, stable on coronal reformats. No  evidence of infarct.  Repeat Head CT on 06/30/2023 revealed 1. Stable postoperative changes from left frontoparietal craniotomy for evacuation of a left convexity subdural hematoma with trace residual blood products along the evacuation site.  2. Unchanged interhemispheric subdural hematoma measuring up to 12  mm in thickness. 3. Unchanged minimal rightward midline shift. Per MD note- plan for PEG placement on 2/24.       SLP Plan  Continue with current plan of care      Recommendations for follow up therapy are one component of a multi-disciplinary discharge planning process, led by the attending physician.  Recommendations may be updated based on patient status, additional functional criteria and insurance authorization.    Recommendations  Diet recommendations: NPO Liquids provided via: Teaspoon (ice chips) Medication Administration: Via alternative means Supervision: Staff to assist with self feeding;Full supervision/cueing for compensatory strategies Compensations: Minimize environmental distractions;Slow rate;Small sips/bites Postural Changes and/or Swallow Maneuvers: Seated upright 90 degrees;Upright 30-60 min after meal                  Oral care prior to ice chip/H20  Frequent or constant  Supervision/Assistance Dysphagia, oral phase (R13.11);Aphasia (R47.01);Apraxia (R48.2);Attention and concentration deficit;Cognitive communication deficit (R41.841)     Continue with current plan of care    [place] Khyson Sebesta Clapp, MS, CCC-SLP Speech Language Pathologist Rehab Services; [facility] [phone] (ascom)   [name]  07/03/2023, 1:38 PM

## 2023-07-03 NOTE — Plan of Care (Signed)
  Problem: Education: Goal: Knowledge of General Education information will improve Description: Including pain rating scale, medication(s)/side effects and non-pharmacologic comfort measures Outcome: Progressing   Problem: Health Behavior/Discharge Planning: Goal: Ability to manage health-related needs will improve Outcome: Progressing   Problem: Clinical Measurements: Goal: Ability to maintain clinical measurements within normal limits will improve Outcome: Progressing Goal: Will remain free from infection Outcome: Progressing Goal: Diagnostic test results will improve Outcome: Progressing Goal: Respiratory complications will improve Outcome: Progressing Goal: Cardiovascular complication will be avoided Outcome: Progressing   Problem: Activity: Goal: Risk for activity intolerance will decrease Outcome: Progressing   Problem: Coping: Goal: Level of anxiety will decrease Outcome: Progressing   Problem: Elimination: Goal: Will not experience complications related to bowel motility Outcome: Progressing Goal: Will not experience complications related to urinary retention Outcome: Progressing   Problem: Pain Managment: Goal: General experience of comfort will improve and/or be controlled Outcome: Progressing   Problem: Safety: Goal: Ability to remain free from injury will improve Outcome: Progressing   Problem: Skin Integrity: Goal: Risk for impaired skin integrity will decrease Outcome: Progressing   Problem: Nutrition: Goal: Adequate nutrition will be maintained Outcome: Not Progressing  Dobhoff tube clogged at 1230 this shift; MD aware no new orders; pt has a PEG tube insertion planned for Monday, 07/05/23

## 2023-07-03 NOTE — Progress Notes (Signed)
 Physical Therapy Treatment Patient Details Name: [name] MRN: [id number] DOB: Oct 17, 1950 Today's Date: 07/03/2023   History of Present Illness 73 y.o female with significant PMH of PAF, IBS, diverticulosis, DVT on Eliquis, HTN, IDA, HLD, OSA, anxiety and depression who presented to the ED with chief complaints of severe headache. Admitted with traumatic subdural hematoma s/p craniotomy for evacuation on 06/25/2023.    PT Comments  PT/OT  co-treat for pt/staff safety. Pt is alert and fully participates. Supportive and helpful family present throughout session. Pt remains mostly non verbal however does follow simple commands inconsistently with increased time. Pt required +2 assistance to achieve EOB sitting. Severe posterior R LOB in sitting requiring constant assistance to maintain to prevent falling back into bed. Pt did progress to standing 2 x EOB with HHA +2. Blocked R knee to prevent buckling. Tolerated standing for ~ 1 minutes 2 x. Author requested [facility] boot for RLE to prevent contractures. Recommend wearing at night. Family educated on stretching that can be performed passively. Pt is progressing but slowly. Dc recs remain appropriate to maximize independence and safety with all ADLs.    If plan is discharge home, recommend the following: Two people to help with walking and/or transfers;Two people to help with bathing/dressing/bathroom;Assistance with feeding;Direct supervision/assist for medications management;Assistance with cooking/housework;Direct supervision/assist for financial management     Equipment Recommendations  Other (comment) (Defer to next level of care)       Precautions / Restrictions Precautions Precautions: Fall Recall of Precautions/Restrictions: Impaired Restrictions Weight Bearing Restrictions Per Provider Order: No     Mobility  Bed Mobility Overal bed mobility: Needs Assistance Bed Mobility: Supine to Sit, Sit to Supine  Supine to sit: Max assist, +2  for physical assistance, +2 for safety/equipment, Used rails Sit to supine: Max assist, +2 for safety/equipment, +2 for physical assistance General bed mobility comments: +2 assistance required to roll R to short sit    Transfers Overall transfer level: Needs assistance Equipment used: 2 person hand held assist Transfers: Sit to/from Stand Sit to Stand: +2 physical assistance, +2 safety/equipment, Mod assist, From elevated surface  General transfer comment: pt was able to stand 2 x EOB with slightly less assistance than previous date. Author blocked RLE to prevent bucklign however not really needed. pt needs max vcs and assistance to erect posture in standing . tends to have flexed cervicle spine and hips    Ambulation/Gait  General Gait Details: unable to walk safely at this time.     Balance Overall balance assessment: Needs assistance Sitting-balance support: Feet supported Sitting balance-Leahy Scale: Zero Sitting balance - Comments: pt has poor sitting balance. tends to have posterior R lateral lean. constant assist to prevent LOB   Standing balance support: Bilateral upper extremity supported, During functional activity Standing balance-Leahy Scale: Poor Standing balance comment: pt's standing balance was actually better than sitting . HHA +2 required however pt stood ~ 1 minute       Communication Communication Communication: Impaired  Cognition Arousal: Alert Behavior During Therapy: WFL for tasks assessed/performed   PT - Cognitive impairments: Safety/Judgement, Problem solving, Attention, Awareness, Orientation Difficult to assess due to: Impaired communication        PT - Cognition Comments: multi modal cues required for command following. mostly non veral Following commands: Impaired      Cueing Cueing Techniques: Verbal cues, Tactile cues, Gestural cues, Visual cues     General Comments General comments (skin integrity, edema, etc.): Chartered loss adjuster requested MD  order [facility]  boot for RLE to prevent contracture      Pertinent Vitals/Pain Pain Assessment Pain Assessment: PAINAD Breathing: normal Negative Vocalization: none Facial Expression: smiling or inexpressive Body Language: relaxed Consolability: no need to console PAINAD Score: 0 Pain Intervention(s): Limited activity within patient's tolerance, Monitored during session, Repositioned     PT Goals (current goals can now be found in the care plan section) Acute Rehab PT Goals Patient Stated Goal: none stated Progress towards PT goals: Progressing toward goals    Frequency    Min 1X/week       Co-evaluation   Reason for Co-Treatment: Complexity of the patient's impairments (multi-system involvement);Necessary to address cognition/behavior during functional activity;For patient/therapist safety;To address functional/ADL transfers PT goals addressed during session: Mobility/safety with mobility;Balance;Proper use of DME;Strengthening/ROM        AM-PAC PT "6 Clicks" Mobility   Outcome Measure  Help needed turning from your back to your side while in a flat bed without using bedrails?: A Lot Help needed moving from lying on your back to sitting on the side of a flat bed without using bedrails?: Total Help needed moving to and from a bed to a chair (including a wheelchair)?: Total Help needed standing up from a chair using your arms (e.g., wheelchair or bedside chair)?: Total Help needed to walk in hospital room?: Total Help needed climbing 3-5 steps with a railing? : Total 6 Click Score: 7    End of Session   Activity Tolerance: Patient tolerated treatment well;Patient limited by fatigue Patient left: in bed;with call bell/phone within reach;with bed alarm set Nurse Communication: Mobility status PT Visit Diagnosis: Muscle weakness (generalized) (M62.81);Other abnormalities of gait and mobility (R26.89);Hemiplegia and hemiparesis Hemiplegia - Right/Left: Right Hemiplegia -  dominant/non-dominant: Dominant Hemiplegia - caused by: Other cerebrovascular disease     Time: 1610-9604 PT Time Calculation (min) (ACUTE ONLY): 24 min  Charges:    $Neuromuscular Re-education: 8-22 mins PT General Charges $$ ACUTE PT VISIT: 1 Visit                    Jetta Lout PTA 07/03/23, 1:32 PM

## 2023-07-03 NOTE — Progress Notes (Signed)
 Occupational Therapy Treatment Patient Details Name: [name] MRN: [id number] DOB: September 05, 1950 Today's Date: 07/03/2023   History of present illness 73 y.o female with significant PMH of PAF, IBS, diverticulosis, DVT on Eliquis, HTN, IDA, HLD, OSA, anxiety and depression who presented to the ED with chief complaints of severe headache. Admitted with traumatic subdural hematoma s/p craniotomy for evacuation on 06/25/2023.   OT comments  Ms. Stoneberg was seen for OT/PT co-treatment on this date. Upon arrival to room pt supine in bed, NT present facilitating self-feeding. Pt remains generally confused/lethargic t/o session. OT/PT facilitated mobility/ADL management with cueing/assist as described below. See ADL section for additional details regarding occupational performance. Pt continues to be functionally limited by decreased cognition, RUE/RLE weakness, decreased activity tolerance and poor balance. Caregivers at bedside educated on safe positioning to maximize skin integrity and neutral positioning of RLE/RUE this date. Pt is progressing toward OT goals and continues to benefit from skilled OT services to maximize return to PLOF and minimize risk of future falls, injury, caregiver burden, and readmission. Will continue to follow POC as written. Discharge recommendation remains appropriate.        If plan is discharge home, recommend the following:  Two people to help with walking and/or transfers;Two people to help with bathing/dressing/bathroom   Equipment Recommendations   (defer)    Recommendations for Other Services      Precautions / Restrictions Precautions Precautions: Fall Recall of Precautions/Restrictions: Impaired Restrictions Weight Bearing Restrictions Per Provider Order: No       Mobility Bed Mobility Overal bed mobility: Needs Assistance Bed Mobility: Supine to Sit, Sit to Supine     Supine to sit: Max assist, +2 for physical assistance, +2 for safety/equipment,  Used rails Sit to supine: Max assist, +2 for safety/equipment, +2 for physical assistance   General bed mobility comments: +2 assistance required to roll R to short sit    Transfers Overall transfer level: Needs assistance Equipment used: 2 person hand held assist Transfers: Sit to/from Stand Sit to Stand: +2 physical assistance, +2 safety/equipment, Mod assist, From elevated surface           General transfer comment: pt was able to stand 2 x EOB with slightly less assistance than previous date. Author blocked RLE to prevent buckling however not really needed. pt needs max vcs and assistance to erect posture in standing . tends to have flexed cervicle spine and hips     Balance Overall balance assessment: Needs assistance Sitting-balance support: Feet supported Sitting balance-Leahy Scale: Zero Sitting balance - Comments: pt has poor sitting balance. tends to have posterior R lateral lean. constant assist to prevent LOB Postural control: Right lateral lean, Posterior lean Standing balance support: Bilateral upper extremity supported, During functional activity Standing balance-Leahy Scale: Poor Standing balance comment: pt's standing balance was actually better than sitting . HHA +2 required however pt stood ~ 1 minute                           ADL either performed or assessed with clinical judgement   ADL Overall ADL's : Needs assistance/impaired     Grooming: Sitting;Wash/dry face;Maximal assistance Grooming Details (indicate cue type and reason): +2 assist to maintain static sitting at EOB. MAX A to bring hand to face this date. Caregiver at bedside provides cueing for pt to wash face. Pt noted to have some difficulty with this task, dabs more at face with cloth vs. using a washing  motion. Appears more limited by cognition this date.                               General ADL Comments: Attempted STS from EOB x2, pt requires MAX +2 for bed mobility and  MOD A +2 to initiate STS, but continues to require significant +2 assist to maintain static standing at EOB. Pt/caregivers educated on positioning to maximize skin integrity and neutral alignment of RUE/RLE this date.    Extremity/Trunk Assessment              Vision Patient Visual Report: No change from baseline     Perception     Praxis     Communication Communication Communication: Impaired Factors Affecting Communication: Difficulty expressing self   Cognition Arousal: Lethargic Behavior During Therapy: Flat affect Cognition: Difficult to assess, Cognition impaired Difficult to assess due to: Level of arousal           OT - Cognition Comments: limited vocalizations during session, does not answer questions or consistently follow VCs during session. Per caregivers at bedside, had a moment of lucidity overnight and was able to identify them by name. This is not evidient during therapy session however.                 Following commands: Impaired Following commands impaired: Follows one step commands inconsistently      Cueing   Cueing Techniques: Verbal cues, Tactile cues, Gestural cues, Visual cues  Exercises Other Exercises Other Exercises: OT facilitated seated ADL management at EOB as described above.    Shoulder Instructions       General Comments Author requested MD order [facility] boot for RLE to prevent contracture    Pertinent Vitals/ Pain       Pain Assessment Pain Assessment: PAINAD Breathing: normal Negative Vocalization: none Facial Expression: smiling or inexpressive Body Language: relaxed Consolability: no need to console PAINAD Score: 0  Home Living                                          Prior Functioning/Environment              Frequency  Min 1X/week        Progress Toward Goals  OT Goals(current goals can now be found in the care plan section)  Progress towards OT goals: Progressing toward  goals  Acute Rehab OT Goals Patient Stated Goal: to return to PLOF OT Goal Formulation: With family Time For Goal Achievement: 07/11/23 Potential to Achieve Goals: Fair  Plan      Co-evaluation    PT/OT/SLP Co-Evaluation/Treatment: Yes Reason for Co-Treatment: Complexity of the patient's impairments (multi-system involvement);Necessary to address cognition/behavior during functional activity;For patient/therapist safety;To address functional/ADL transfers PT goals addressed during session: Mobility/safety with mobility;Balance;Proper use of DME;Strengthening/ROM OT goals addressed during session: ADL's and self-care      AM-PAC OT "6 Clicks" Daily Activity     Outcome Measure   Help from another person eating meals?: Total Help from another person taking care of personal grooming?: Total Help from another person toileting, which includes using toliet, bedpan, or urinal?: Total Help from another person bathing (including washing, rinsing, drying)?: Total Help from another person to put on and taking off regular upper body clothing?: Total Help from another person to put on and taking off  regular lower body clothing?: Total 6 Click Score: 6    End of Session    OT Visit Diagnosis: Other abnormalities of gait and mobility (R26.89);Muscle weakness (generalized) (M62.81)   Activity Tolerance Patient tolerated treatment well   Patient Left in bed;with call bell/phone within reach;with bed alarm set   Nurse Communication Mobility status        Time: 1610-9604 OT Time Calculation (min): 24 min  Charges: OT General Charges $OT Visit: 1 Visit OT Treatments $Self Care/Home Management : 8-22 mins  Rockney Ghee, M.S., OTR/L 07/03/23, 3:06 PM

## 2023-07-03 NOTE — Plan of Care (Signed)

## 2023-07-03 NOTE — Plan of Care (Addendum)
 IR was requested for G tube placement.   CT reviewed by Dr. Deanne Coffer, anatomy is amenable for percutaneous G tube placement.   Patient had fever two days ago, CBC today with mild leukocytosis, INR 1.4 on 2/14.  The procedure is tentatively scheduled for Monday pending IR schedule.    PLAN - Hold TF Monday MN, meds can be given with small amount of water  - sq heparin MAR held  - Repeat labs on Monday AM - Ancef 2 g to be given during the procedure, signed and held  - Formal consult to follow   Please call IR for questions and concerns.    Melissa Bologna Loic Hobin PA-C 07/03/2023 2:07 PM

## 2023-07-03 NOTE — Progress Notes (Addendum)
 [facility]  - [place] [facility]   PATIENT NAME: [name]    MR#:  [id number]  DATE OF BIRTH:  [birth year]  SUBJECTIVE:  daughter at bedside. Patient unable to give any history review of systems. She moves around and follow some basic commands. Right upper extremity weakness. Spontaneously moves right lower extremity. Able to move left upper and lower extremity. Remains in sinus rhythm. Sats more than 92%.   VITALS:  Blood pressure (!) 125/53, pulse 69, temperature 99.1 F (37.3 C), resp. rate 17, height 5\' 1"  (1.549 m), weight 64.5 kg, SpO2 98%.  PHYSICAL EXAMINATION:   GENERAL:  73 y.o.-year-old patient with no acute distress. Dobhoff + LUNGS: Normal breath sounds bilaterally CARDIOVASCULAR: S1, S2 normal. No murmur   ABDOMEN: Soft, nontender, nondistended. Bowel sounds present.  EXTREMITIES: No  edema b/l.    NEUROLOGIC: right upper and lower extremity weakness  patient is alert and awake unable to communicate   LABORATORY PANEL:  CBC Recent Labs  Lab 07/03/23 0443  WBC 11.3*  HGB 12.9  HCT 38.2  PLT 292    Chemistries  Recent Labs  Lab 07/03/23 0443  NA 135  K 4.1  CL 102  CO2 25  GLUCOSE 103*  BUN 17  CREATININE 0.68  CALCIUM 9.1  MG 2.0    Assessment and Plan 73 y.o female with significant PMH of PAF, IBS, diverticulosis, DVT on Eliquis, HTN, IDA, HLD, OSA, anxiety and depression who presented to the ED with chief complaints of severe headache.  Per ED reports, patient apparently fell hitting the back of her head and may have lost consciousness per her brother.    CT Head 2/14 large left subdural hematoma with mass effect on the left lateral ventricles  EEG: This study is suggestive of cortical dysfunction arising from left temporal region likely secondary to underlying sub dural hematoma/ cranioplasty. No seizures or epileptiform discharges were seen throughout the recording.   CT Head 06/30/2023 1. Stable postoperative  changes from left frontoparietal craniotomy for evacuation of a left convexity subdural hematoma with trace residual blood products along the evacuation site. 2. Unchanged interhemispheric subdural hematoma measuring up to 12 mm in thickness. 3. Unchanged minimal rightward midline shift.  Procedures:  2/14:Left-sided emergent craniotomy for hematoma evacuation   Traumatic subdural hematoma with left-to-right midline shift --S/p left craniotomy done on 2/14 --Drain removed on 2/16 - Continue keppra - ok to resume home Eliquis on POD 14 (2/28). OK to give DVT prophylaxis - repeat head CT around 4 weeks post-op. Pt may need an MMA embo in the future as per NeuroSx    UTI --2/21 UA positive --Started ceftriaxone 1 g IV daily x 5 days -- urine culture 80K GNR -- blood culture neg   Oral thrush 2/21 Started IV diflucan, switch to oral via tube after PEG tube insertion    Altered Mental Status Seizure Activity likely in the setting for tSDH -Repeat imaging as above stable -Seizure prophylaxis with Keppra -Lorazepam/Versed PRN for breakthrough seizure --Patient was seen by neurology.   Acute Blood Loss Anemia due to SDH Hx of IDA --Hgb 6.1 on presentation s/p 2units PRBCs --hgb 12.6  Paroxysmal AF rate controlled HTN HLD Hx of LVH -Hold diltiazem, spironolactone, Imdur for now  --S/p Nicardipine gtt --Hold Eliquis, resume on POD 14 (2/28) --Continue Atorvastatin and Lipitor once able to take po --2/19 started metoprolol 25 twice daily --2/20 increased metoprolol 50 mg p.o. twice daily and started amlodipine 10 mg  p.o. daily --Continue labetalol IV as needed   Anxiety and Depression --continue home Citalopram  and Lorazepam    Hypomagnesemia, mag repleted.    Vitamin D deficiency: Started vitamin D oral supplement. -- follow with PCP    Dysphagia secondary to subdural hemorrhage --Continue NG feeding SLP and nutritionist following 2/21 discussed with patient's  family, agreed for PEG tube placement Secure chat/text sent to IR to keep her on the schedule for PEG tube on Monday --2/22--per RN--Dobhoff appears to have clogged. Tried coke flush also. Will hold feeding for now and get PEG placement on Monday  discussed with daughter at bedside and updated. TOC for discharge planning to rehab.      Diet: NG tube feeding DVT Prophylaxis: heparin Advance goals of care discussion: Full code Family communication :dter at bedside Consults : neurosurgery, IR  Level of care: Telemetry Medical Status is: Inpatient Remains inpatient appropriate because: subdural hematoma, UTI, needs peg placement    TOTAL TIME TAKING CARE OF THIS PATIENT: 45 minutes.  >50% time spent on counselling and coordination of care  Note: This dictation was prepared with Dragon dictation along with smaller phrase technology. Any transcriptional errors that result from this process are unintentional.  Enedina Finner M.D    [facility]   CC: Primary care physician; [name], MD

## 2023-07-03 NOTE — Progress Notes (Signed)
 RN able to flush dobhof NG tube with 24mL of water vie 12mL syringe and started tube feed back at 98mL/hr.

## 2023-07-04 DIAGNOSIS — S065XAA Traumatic subdural hemorrhage with loss of consciousness status unknown, initial encounter: Secondary | ICD-10-CM | POA: Diagnosis not present

## 2023-07-04 DIAGNOSIS — I10 Essential (primary) hypertension: Secondary | ICD-10-CM | POA: Diagnosis not present

## 2023-07-04 DIAGNOSIS — R569 Unspecified convulsions: Secondary | ICD-10-CM | POA: Diagnosis not present

## 2023-07-04 DIAGNOSIS — N3000 Acute cystitis without hematuria: Secondary | ICD-10-CM | POA: Diagnosis not present

## 2023-07-04 LAB — URINE CULTURE: Culture: 80000 — AB

## 2023-07-04 LAB — GLUCOSE, CAPILLARY
Glucose-Capillary: 106 mg/dL — ABNORMAL HIGH (ref 70–99)
Glucose-Capillary: 76 mg/dL (ref 70–99)
Glucose-Capillary: 83 mg/dL (ref 70–99)
Glucose-Capillary: 84 mg/dL (ref 70–99)
Glucose-Capillary: 91 mg/dL (ref 70–99)

## 2023-07-04 NOTE — Plan of Care (Signed)

## 2023-07-04 NOTE — Progress Notes (Signed)
 Neurosurgery Progress Note  History: [name] is here for subdural hematoma s/p craniotomy for evacuation on 06/25/2023. She is currently 9 Days Post-Op  POD9: Continues to have waxing and waning mental status POD7: Pt transferred to floor. POD5: waxing and waning mental status.  Sleepy today. POD4: RN was told she had an episode of lucidity overnight but has been confused and moaning this morning. Pain appears to have improved with PRNs. Does not appear to be grabbing for her head.  POD3: continues confusion and trouble with speech this morning POD2: She had agitation overnight.  Continues to have difficulty with speech. POD1: She is agitated but has improved compared to pre-op.  Physical Exam: Vitals:   07/04/23 0446 07/04/23 0721  BP:  (!) 132/57  Pulse:  67  Resp:  18  Temp:  97.9 F (36.6 C)  SpO2: 96% 100%   Limited language function currently, speech is unclear. Eyes open spontaneously. Purposeful with LUE and LLE, RLE weaker than LLE, but strong withdrawal. RUE with tone but no purposeful withdrawal.   Patient appears to have right sided neglect  CNI with  R facial droop   Data:  Index Imaging: Other tests/results:  CT head 2/16  FINDINGS: Brain: No significant reaccumulation of the recently evaluated subdural hematoma on the left, on a few coronal slices it is possible there is trace increase in blood products along the craniotomy flap. Dominant residual is still left parafalcine at the frontal parietal junction measuring up to 12 mm in thickness. Trace midline shift without entrapment, stable on coronal reformats. No evidence of infarct   Vascular: No hyperdense vessel or unexpected calcification.   Skull: Unremarkable left-sided craniotomy site.   Sinuses/Orbits: Negative   IMPRESSION: Essentially stable postoperative head CT.  No new abnormality.     Electronically Signed   By: Tiburcio Pea M.D.   On: 06/27/2023 04:11  MRI brain  2/16 IMPRESSION: 1. Post-operative changes from prior left subdural hematoma evacuation. The residual subdural hematoma is stable in size as compared to the head CT performed earlier today. As before, the largest component is located along the posterior falx, measuring 12 mm in thickness. 2. A thin subdural collection (likely hematoma) is also present along the mid and anterior falx, and left cerebral convexity (measuring up to 3 mm in thickness). 3. Mild chronic small vessel ischemic changes within the cerebral white matter and pons. 4. Few chronic microhemorrhages within the left parietal and occipital lobes.     Electronically Signed   By: Jackey Loge D.O.   On: 06/27/2023 17:33  CT Head 06/30/2023 IMPRESSION: 1. Stable postoperative changes from left frontoparietal craniotomy for evacuation of a left convexity subdural hematoma with trace residual blood products along the evacuation site. 2. Unchanged interhemispheric subdural hematoma measuring up to 12 mm in thickness. 3. Unchanged minimal rightward midline shift.     Electronically Signed   By: Orvan Falconer M.D.   On: 06/30/2023 13:35  New Imaging in past 48hrs:  No results found.   Recent Labs  Lab 07/01/23 0439 07/02/23 0536 07/03/23 0443  NA 135 133* 135  K 4.2 4.4 4.1  CL 99 99 102  CO2 26 26 25   BUN 12 14 17   CREATININE 0.59 0.63 0.68  GLUCOSE 117* 107* 103*  CALCIUM 8.6* 8.7* 9.1   No results for input(s): "AST", "ALT", "ALKPHOS" in the last 168 hours.  Invalid input(s): "TBILI"   Recent Labs  Lab 07/01/23 [phone] 07/02/23 0536 07/03/23 0443  WBC 7.6 8.9 11.3*  HGB 12.1 12.1 12.9  HCT 35.0* 35.8* 38.2  PLT 216 255 292   No results for input(s): "APTT", "INR" in the last 168 hours.      No results found.   Assessment/Plan:  [name] has shown some improvement after subdural hematoma evacuation but has continued speech abnormality and RUE paresis. MRI and head CT reassuring.  She is currently 9 Days Post-Op.   - mobilize - pain control - minimize narcotics - ok for DVT prophylaxis from neurosurgical standpoint  - PTOTST - ok to start - Continue keppra - ok to resume home Eliquis on POD 14.  - Will plan for repeat head CT around 4 weeks post-op. Pt may need an MMA embo in the future. - Reviewed Chart, PEG planned for Monday.   - Patient continues to have waxing and waning neuroexam. At time of eval was awake and attentive with questionable language fx and with potentail right sided neglect.   Lovenia Kim, MD Department of Neurosurgery

## 2023-07-04 NOTE — Progress Notes (Signed)
 Patient able to answer yes/no questions with one blink for yes and 2 blinks for no.   Patient accurately identified her daughter and son-in-law who were at the bedside.

## 2023-07-04 NOTE — NC FL2 (Signed)
 [place] MEDICAID FL2 LEVEL OF CARE FORM     IDENTIFICATION  Patient Name: [name] Birthdate: 04-23-1951 Sex: female Admission Date (Current Location): 06/25/2023  [facility] and [place] Number:  Chiropodist and Address:  [facility], [address], [place], [place] [address]      Provider Number:    Attending Physician Name and Address:  Enedina Finner, MD  Relative Name and Phone Number:  Rollene Fare (Daughter)  [phone] (Home Phone)    Current Level of Care: Hospital Recommended Level of Care: Skilled Nursing Facility Prior Approval Number:    Date Approved/Denied:   PASRR Number: [id number]  Discharge Plan: SNF    Current Diagnoses: Patient Active Problem List   Diagnosis Date Noted   Seizure (HCC) 07/03/2023   Acute cystitis without hematuria 07/03/2023   Subdural hematoma (HCC) 06/25/2023   Midline shift of brain with brain compression (HCC) 06/25/2023   Glasgow coma scale total score 9-12 06/25/2023   Acute metabolic encephalopathy 03/01/2022   Anxiety and depression 03/01/2022   Dyslipidemia 03/01/2022   GERD without esophagitis 03/01/2022   SIRS (systemic inflammatory response syndrome) (HCC) 02/28/2022   Gastroenteritis due to norovirus    Nausea vomiting and diarrhea    Hypomagnesemia    Hypokalemia    Hyperlipidemia    Gastroesophageal reflux disease without esophagitis    Intractable vomiting 08/08/2020   Esophagitis on CT 08/08/2020   SVT (supraventricular tachycardia) (HCC) 03/07/2019   Acute respiratory distress 11/16/2017   Acute respiratory failure (HCC) 11/16/2017   Screening for lipid disorders 07/28/2016   DOE (dyspnea on exertion) 07/02/2016   Bradycardia 07/02/2016   Anxiety 07/02/2016   Anxiety state    Atypical chest pain 03/08/2015   Fever 08/12/2010   ABDOMINAL PAIN, LEFT LOWER QUADRANT 06/23/2010   DIVERTICULITIS, ACUTE 02/25/2010   CONSTIPATION 01/22/2010   IRRITABLE BOWEL  SYNDROME 01/22/2010   Primary hypertension 01/21/2010   HYPERTENSION, CONTROLLED 01/21/2010   DEEP VENOUS THROMBOPHLEBITIS 01/21/2010   ESOPHAGEAL STRICTURE 02/29/2004   Diverticulosis of colon 02/29/2004    Orientation RESPIRATION BLADDER Height & Weight     Self  Normal External catheter Weight: 66.6 kg Height:  5\' 1"  (154.9 cm)  BEHAVIORAL SYMPTOMS/MOOD NEUROLOGICAL BOWEL NUTRITION STATUS       (NO BM documented/NG tube in place.) NG/panda (NPO as of 07/02/23. SLP following. Clamped NG for meds.)  AMBULATORY STATUS COMMUNICATION OF NEEDS Skin   Total Care Verbally Normal, Surgical wounds                       Personal Care Assistance Level of Assistance  Total care       Total Care Assistance: Maximum assistance   Functional Limitations Info  Speech     Speech Info: Impaired (expressive aphasia)    SPECIAL CARE FACTORS FREQUENCY  PT (By licensed PT), OT (By licensed OT)     PT Frequency: 5x/week OT Frequency: 5x/week            Contractures Contractures Info: Not present (PRAFO boot for RLE to prevent contractures requested by PT. Recommend wearing at night. Family educated on stretching that can be performed passively.)    Additional Factors Info  Code Status, Allergies Code Status Info: Full Code Allergies Info: Dicyclomine, Lisinopril, Losartan, Ace Inhibitors, Penicillins as of 07/04/23.           Current Medications (07/04/2023):  This is the current hospital active medication list Current Facility-Administered Medications  Medication  Dose Route Frequency Provider Last Rate Last Admin   amLODipine (NORVASC) tablet 10 mg  10 mg Per Tube Daily Angelique Blonder, RPH   10 mg at 07/04/23 0919   cefTRIAXone (ROCEPHIN) 1 g in sodium chloride 0.9 % 100 mL IVPB  1 g Intravenous Q24H Enedina Finner, MD   Stopped at 07/03/23 1715   cholecalciferol (VITAMIN D3) 10 MCG/ML oral liquid 4,000 Units  4,000 Units Per Tube BID Gillis Santa, MD   4,000 Units at  07/03/23 2135   dextrose 5 % solution   Intravenous Continuous Enedina Finner, MD 40 mL/hr at 07/03/23 1816 New Bag at 07/03/23 1816   docusate (COLACE) 50 MG/5ML liquid 100 mg  100 mg Per Tube BID PRN Hunt, Madison H, RPH       famotidine (PEPCID) tablet 20 mg  20 mg Per Tube BID Tressie Ellis, RPH   20 mg at 07/04/23 9528   fluconazole (DIFLUCAN) IVPB 200 mg  200 mg Intravenous Q24H Enedina Finner, MD   Stopped at 07/03/23 1235   heparin injection 5,000 Units  5,000 Units Subcutaneous Q12H Assaker, [place], MD   5,000 Units at 07/04/23 0916   HYDROmorphone (DILAUDID) injection 0.5-1 mg  0.5-1 mg Intravenous Q4H PRN Gillis Santa, MD   1 mg at 07/04/23 0441   ipratropium-albuterol (DUONEB) 0.5-2.5 (3) MG/3ML nebulizer solution 3 mL  3 mL Nebulization Q6H PRN Gillis Santa, MD       labetalol (NORMODYNE) injection 10 mg  10 mg Intravenous Q2H PRN Assaker, [place], MD   10 mg at 07/01/23 1623   levETIRAcetam (KEPPRA) IVPB 500 mg/100 mL premix  500 mg Intravenous Q12H Jimmye Norman, NP   Stopped at 07/04/23 0935   LORazepam (ATIVAN) injection 0.5 mg  0.5 mg Intravenous Q8H PRN Assaker, [place], MD   0.5 mg at 07/01/23 1907   metoprolol tartrate (LOPRESSOR) tablet 50 mg  50 mg Per Tube BID Gillis Santa, MD   50 mg at 07/04/23 4132   Oral care mouth rinse  15 mL Mouth Rinse 4 times per day Erin Fulling, MD   15 mL at 07/04/23 0800   Oral care mouth rinse  15 mL Mouth Rinse PRN Erin Fulling, MD       polyethylene glycol (MIRALAX / GLYCOLAX) packet 17 g  17 g Per Tube Daily PRN Hunt, Madison H, RPH       QUEtiapine (SEROQUEL) tablet 25 mg  25 mg Per Tube QHS Lowella Bandy, RPH   25 mg at 07/03/23 2123   traZODone (DESYREL) tablet 50 mg  50 mg Per Tube QHS PRN Merryl Hacker, RPH   50 mg at 07/03/23 2123     Discharge Medications: Please see discharge summary for a list of discharge medications.  Relevant Imaging Results:  Relevant Lab Results:   Additional  Information SS# [id number]  Bing Quarry, RN

## 2023-07-04 NOTE — Progress Notes (Signed)
 Tube feed able to run for a little while after being flushed and unclogged. RN was able to administer medications through the tube and flush prior to restarting tube feed. Tube clogged again, RN flushed the Dobbhoff NG tube and restarted the tube feed. NG tube clogged again and RN  unable to restart food. Patient began gagging on the tube, then sneezed, no emesis. O2 sat 100% on RA. Patient is having PEG placed on Monday.   Patient has D5 running at 74mL/hr  CBG 79    RN notified Marissa Nestle, NP of the above information.   Orders Received : continue monitoring

## 2023-07-04 NOTE — TOC Progression Note (Addendum)
 Transition of Care [facility]) - Progression Note    Patient Details  Name: [name] MRN: [id number] Date of Birth: 01/14/1951  [facility] CM/SW Contact  Bing Quarry, RN Phone Number: 07/04/2023, 11:51 AM  Clinical Narrative:  2/23: [facility] RN CM spoke to daughter, [name], regarding STR/SNF options and she has a specific preference for [facility]. She has been in contact with patient's insurance advocate and stated insurance will be switched back to regular Medicare and also Medicaid, this is being worked on today.  This will assist with post STR rehab placement if needed. Explained process of submission of documents electronically vis EPIC hub, that someone will like not be seen till Monday am, but encouraged her to reach out to Admission Coordinator after noon to see if she can speak directly to her about possible bed offer, insurance transition. Also explained that need to look at alternative choices in case [facility] cannot accept patient for some reason she will need to provider alternative choices. Directions to Medicare.gov and instructions how to look up choice list and to call with any questions with RN CM call back number for today. Will submit referral to [facility] with not about insurance transition to [facility] and Medicaid pending.   TOC to follow, please reach out daughter for other choices if [facility] cannot take this patient. Daughter to review list and make more choices.   122 pm: New PASSR screen, went to request for additional documentation/FL2, which was uploaded to [facility] Must just now. [place] MUST #ID [id number]   Gabriel Cirri MSN RN CM  RN Case Manager [place]  Transitions of Care Direct Dial: [phone] (Weekends Only) [facility] Main Office Phone: [phone] [facility] Fax: [phone] [email or web address]     Expected Discharge Plan: Skilled Nursing Facility Barriers to Discharge: Continued Medical Work up  Expected Discharge Plan and  Services     Post Acute Care Choice: IP Rehab, Skilled Nursing Facility Living arrangements for the past 2 months: Single Family Home                                       Social Determinants of Health (SDOH) Interventions SDOH Screenings   Food Insecurity: No Food Insecurity (03/29/2023)   Received from [facility]  Housing: Low Risk  (05/26/2023)   Received from [facility]  Transportation Needs: No Transportation Needs (03/29/2023)   Received from [facility]  Utilities: Not At Risk (03/29/2023)   Received from [facility]  Financial Resource Strain: Low Risk  (03/29/2023)   Received from [facility]  Tobacco Use: Low Risk  (06/25/2023)    Readmission Risk Interventions     No data to display

## 2023-07-04 NOTE — Progress Notes (Signed)
 [facility]  - [place] at [facility]   PATIENT NAME: [name]    MR#:  [id number]  DATE OF BIRTH:  [birth year]  SUBJECTIVE:  sister and brother at bedside. Spoke with daughter [name] on the phone  patient unable to give any history review of systems. She moves around and follow some basic commands. Right upper extremity weakness. Spontaneously moves right lower extremity. Able to move left upper and lower extremity. Remains in sinus rhythm. Sats more than 92%. Per RN feeding has been stopped since Dobhoff not working. Able to give some meds which are crushed.   VITALS:  Blood pressure (!) 132/57, pulse 67, temperature 97.9 F (36.6 C), resp. rate 18, height 5\' 1"  (1.549 m), weight 66.6 kg, SpO2 100%.  PHYSICAL EXAMINATION:   GENERAL:  73 y.o.-year-old patient with no acute distress. Dobhoff + LUNGS: Normal breath sounds bilaterally CARDIOVASCULAR: S1, S2 normal. No murmur   ABDOMEN: Soft, nontender, nondistended. Bowel sounds present.  EXTREMITIES: No  edema b/l.    NEUROLOGIC: right upper and lower extremity weakness  patient is alert and awake unable to communicate   LABORATORY PANEL:  CBC Recent Labs  Lab 07/03/23 0443  WBC 11.3*  HGB 12.9  HCT 38.2  PLT 292    Chemistries  Recent Labs  Lab 07/03/23 0443  NA 135  K 4.1  CL 102  CO2 25  GLUCOSE 103*  BUN 17  CREATININE 0.68  CALCIUM 9.1  MG 2.0    Assessment and Plan 73 y.o female with significant PMH of PAF, IBS, diverticulosis, DVT on Eliquis, HTN, IDA, HLD, OSA, anxiety and depression who presented to the ED with chief complaints of severe headache.  Per ED reports, patient apparently fell hitting the back of her head and may have lost consciousness per her brother.    CT Head 2/14 large left subdural hematoma with mass effect on the left lateral ventricles  EEG: This study is suggestive of cortical dysfunction arising from left temporal region likely secondary to underlying sub  dural hematoma/ cranioplasty. No seizures or epileptiform discharges were seen throughout the recording.   CT Head 06/30/2023 1. Stable postoperative changes from left frontoparietal craniotomy for evacuation of a left convexity subdural hematoma with trace residual blood products along the evacuation site. 2. Unchanged interhemispheric subdural hematoma measuring up to 12 mm in thickness. 3. Unchanged minimal rightward midline shift.  Procedures:  2/14:Left-sided emergent craniotomy for hematoma evacuation   Traumatic subdural hematoma with left-to-right midline shift --S/p left craniotomy done on 2/14 --Drain removed on 2/16 - Continue keppra - ok to resume home Eliquis on POD 14 (2/28). OK to give DVT prophylaxis - repeat head CT around 4 weeks post-op. Pt may need an MMA embo in the future as per NeuroSx    UTI --2/21 UA positive --Started ceftriaxone 1 g IV daily x 5 days -- urine culture 80K GNR -- blood culture neg   Oral thrush 2/21 Started IV diflucan, switch to oral via tube after PEG tube insertion    Altered Mental Status Seizure Activity likely in the setting for tSDH -Repeat imaging as above stable -Seizure prophylaxis with Keppra -Lorazepam/Versed PRN for breakthrough seizure --Patient was seen by neurology.   Acute Blood Loss Anemia due to SDH Hx of IDA --Hgb 6.1 on presentation s/p 2units PRBCs --hgb 12.6  Paroxysmal AF rate controlled HTN HLD Hx of LVH -Hold diltiazem, spironolactone, Imdur for now  --S/p Nicardipine gtt --Hold Eliquis, resume on POD 14 (2/28) --  Continue Atorvastatin and Lipitor once able to take po --2/19 started metoprolol 25 twice daily --2/20 increased metoprolol 50 mg p.o. twice daily and started amlodipine 10 mg p.o. daily --Continue labetalol IV as needed   Anxiety and Depression --continue home Citalopram  and Lorazepam    Hypomagnesemia, mag repleted.    Vitamin D deficiency: Started vitamin D oral supplement. --  follow with PCP    Dysphagia secondary to subdural hemorrhage --Continue NG feeding SLP and nutritionist following 2/21 discussed with patient's family, agreed for PEG tube placement Secure chat/text sent to IR to keep her on the schedule for PEG tube on Monday --2/22--per RN--Dobhoff appears to have clogged. Tried coke flush also. Will hold feeding for now and get PEG placement on Monday --2/23-- started on dextrose to keep sugars stable. Tube feeding on hold. Discussed with RN to give meds which are crushable through NG  discussed with daughter on the phone. TOC for discharge planning to rehab.      Diet: NG tube feeding--onhold DVT Prophylaxis: heparin Advance goals of care discussion: Full code Family communication :dter at bedside Consults : neurosurgery, IR  Level of care: Telemetry Medical Status is: Inpatient Remains inpatient appropriate because: subdural hematoma, UTI, needs peg placement    TOTAL TIME TAKING CARE OF THIS PATIENT: 45 minutes.  >50% time spent on counselling and coordination of care  Note: This dictation was prepared with Dragon dictation along with smaller phrase technology. Any transcriptional errors that result from this process are unintentional.  Enedina Finner M.D    [facility]   CC: Primary care physician; [name], MD

## 2023-07-04 NOTE — Plan of Care (Signed)
  Problem: Clinical Measurements: Goal: Ability to maintain clinical measurements within normal limits will improve Outcome: Progressing Goal: Will remain free from infection Outcome: Progressing Goal: Respiratory complications will improve Outcome: Progressing Goal: Cardiovascular complication will be avoided Outcome: Progressing   Problem: Activity: Goal: Risk for activity intolerance will decrease Outcome: Progressing   Problem: Coping: Goal: Level of anxiety will decrease Outcome: Progressing   Problem: Elimination: Goal: Will not experience complications related to bowel motility Outcome: Progressing Goal: Will not experience complications related to urinary retention Outcome: Progressing   Problem: Pain Managment: Goal: General experience of comfort will improve and/or be controlled Outcome: Progressing   Problem: Safety: Goal: Ability to remain free from injury will improve Outcome: Progressing   Problem: Skin Integrity: Goal: Risk for impaired skin integrity will decrease Outcome: Progressing   Problem: Education: Goal: Knowledge of General Education information will improve Description: Including pain rating scale, medication(s)/side effects and non-pharmacologic comfort measures Outcome: Not Progressing   Problem: Health Behavior/Discharge Planning: Goal: Ability to manage health-related needs will improve Outcome: Not Progressing   Problem: Nutrition: Goal: Adequate nutrition will be maintained Outcome: Not Progressing  Dobhoff tube can not tolerate previous order for tube feeding; pt scheduled for PEG tube 07/05/23 Problem: Clinical Measurements: Goal: Diagnostic test results will improve Outcome: Not Applicable

## 2023-07-05 ENCOUNTER — Inpatient Hospital Stay: Payer: 59 | Admitting: Radiology

## 2023-07-05 ENCOUNTER — Inpatient Hospital Stay: Payer: 59

## 2023-07-05 HISTORY — PX: IR GASTROSTOMY TUBE MOD SED: IMG625

## 2023-07-05 LAB — CBC WITH DIFFERENTIAL/PLATELET
Abs Immature Granulocytes: 0.06 10*3/uL (ref 0.00–0.07)
Basophils Absolute: 0.1 10*3/uL (ref 0.0–0.1)
Basophils Relative: 1 %
Eosinophils Absolute: 0.2 10*3/uL (ref 0.0–0.5)
Eosinophils Relative: 3 %
HCT: 40.9 % (ref 36.0–46.0)
Hemoglobin: 14.5 g/dL (ref 12.0–15.0)
Immature Granulocytes: 1 %
Lymphocytes Relative: 30 %
Lymphs Abs: 2.7 10*3/uL (ref 0.7–4.0)
MCH: 31.3 pg (ref 26.0–34.0)
MCHC: 35.5 g/dL (ref 30.0–36.0)
MCV: 88.3 fL (ref 80.0–100.0)
Monocytes Absolute: 1.1 10*3/uL — ABNORMAL HIGH (ref 0.1–1.0)
Monocytes Relative: 13 %
Neutro Abs: 4.8 10*3/uL (ref 1.7–7.7)
Neutrophils Relative %: 52 %
Platelets: 307 10*3/uL (ref 150–400)
RBC: 4.63 MIL/uL (ref 3.87–5.11)
RDW: 13.9 % (ref 11.5–15.5)
WBC: 8.9 10*3/uL (ref 4.0–10.5)
nRBC: 0 % (ref 0.0–0.2)

## 2023-07-05 LAB — GLUCOSE, CAPILLARY
Glucose-Capillary: 108 mg/dL — ABNORMAL HIGH (ref 70–99)
Glucose-Capillary: 92 mg/dL (ref 70–99)
Glucose-Capillary: 71 mg/dL (ref 70–99)
Glucose-Capillary: 58 mg/dL — ABNORMAL LOW (ref 70–99)
Glucose-Capillary: 183 mg/dL — ABNORMAL HIGH (ref 70–99)

## 2023-07-05 LAB — PROTIME-INR
INR: 1 (ref 0.8–1.2)
Prothrombin Time: 13.5 s (ref 11.4–15.2)

## 2023-07-05 MED ORDER — GLUCAGON HCL RDNA (DIAGNOSTIC) 1 MG IJ SOLR
INTRAMUSCULAR | Status: AC
  Filled 2023-07-05: qty 1

## 2023-07-05 MED ORDER — VANCOMYCIN HCL IN DEXTROSE 1-5 GM/200ML-% IV SOLN: 1000.0000 mg | INTRAVENOUS | Status: DC

## 2023-07-05 MED ORDER — GLUCAGON HCL RDNA (DIAGNOSTIC) 1 MG IJ SOLR
INTRAMUSCULAR | Status: DC | PRN
  Administered 2023-07-05: 1 mg via INTRAVENOUS

## 2023-07-05 MED ORDER — FENTANYL CITRATE (PF) 100 MCG/2ML IJ SOLN
INTRAMUSCULAR | Status: AC
  Filled 2023-07-05: qty 2

## 2023-07-05 MED ORDER — IOHEXOL 300 MG/ML  SOLN
15.0000 mL | Freq: Once | INTRAMUSCULAR | Status: AC | PRN
  Administered 2023-07-05: 15 mL

## 2023-07-05 MED ORDER — OSMOLITE 1.2 CAL PO LIQD
1000.0000 mL | ORAL | Status: DC
  Administered 2023-07-06 – 2023-07-09 (×5): 1000 mL

## 2023-07-05 MED ORDER — LIDOCAINE HCL 1 % IJ SOLN
5.0000 mL | Freq: Once | INTRAMUSCULAR | Status: AC
  Administered 2023-07-05: 5 mL via INTRADERMAL

## 2023-07-05 MED ORDER — FENTANYL CITRATE (PF) 100 MCG/2ML IJ SOLN
INTRAMUSCULAR | Status: DC | PRN
  Administered 2023-07-05: 50 ug via INTRAVENOUS

## 2023-07-05 MED ORDER — LIDOCAINE HCL 1 % IJ SOLN
INTRAMUSCULAR | Status: AC
  Filled 2023-07-05: qty 20

## 2023-07-05 MED ORDER — MIDAZOLAM HCL 5 MG/5ML IJ SOLN
INTRAMUSCULAR | Status: DC | PRN
  Administered 2023-07-05: 1 mg via INTRAVENOUS

## 2023-07-05 MED ORDER — LIDOCAINE VISCOUS HCL 2 % MT SOLN
OROMUCOSAL | Status: AC
  Filled 2023-07-05: qty 15

## 2023-07-05 MED ORDER — FREE WATER
50.0000 mL | Status: DC
  Administered 2023-07-06 – 2023-07-12 (×32): 50 mL

## 2023-07-05 MED ORDER — DEXTROSE 5 % IV SOLN: INTRAVENOUS | Status: DC

## 2023-07-05 MED ORDER — DEXTROSE 50 % IV SOLN
12.5000 g | INTRAVENOUS | Status: DC | PRN
  Administered 2023-07-05: 12.5 g via INTRAVENOUS
  Filled 2023-07-05: qty 50

## 2023-07-05 MED ORDER — MIDAZOLAM HCL 2 MG/2ML IJ SOLN
INTRAMUSCULAR | Status: AC
  Filled 2023-07-05: qty 2

## 2023-07-05 NOTE — Progress Notes (Signed)
 [facility]  - [place] at [facility]   PATIENT NAME: [name]    MR#:  [id number]  DATE OF BIRTH:  [birth year]  SUBJECTIVE:  daughter at bedside. Patient per interventional radiology now scheduled for peg tomorrow morning. Patient currently off tube feeding due to NG tube clock. Getting IV dextrose. Daughter tearful. Daughter reports patient has been complaining of pain/winces when right shoulder is moved VITALS:  Blood pressure (!) 155/62, pulse 82, temperature 98.1 F (36.7 C), resp. rate 18, height 5\' 1"  (1.549 m), weight 66.6 kg, SpO2 97%.  PHYSICAL EXAMINATION:   GENERAL:  73 y.o.-year-old patient with no acute distress. Dobhoff + LUNGS: Normal breath sounds bilaterally CARDIOVASCULAR: S1, S2 normal. No murmur   ABDOMEN: Soft, nontender, nondistended. Bowel sounds present.  EXTREMITIES: No  edema b/l.    NEUROLOGIC: right upper and lower extremity weakness  patient is alert and awake unable to communicate   LABORATORY PANEL:  CBC Recent Labs  Lab 07/05/23 0015  WBC 8.9  HGB 14.5  HCT 40.9  PLT 307    Chemistries  Recent Labs  Lab 07/03/23 0443  NA 135  K 4.1  CL 102  CO2 25  GLUCOSE 103*  BUN 17  CREATININE 0.68  CALCIUM 9.1  MG 2.0    Assessment and Plan 73 y.o female with significant PMH of PAF, IBS, diverticulosis, DVT on Eliquis, HTN, IDA, HLD, OSA, anxiety and depression who presented to the ED with chief complaints of severe headache.  Per ED reports, patient apparently fell hitting the back of her head and may have lost consciousness per her brother.    CT Head 2/14 large left subdural hematoma with mass effect on the left lateral ventricles  EEG: This study is suggestive of cortical dysfunction arising from left temporal region likely secondary to underlying sub dural hematoma/ cranioplasty. No seizures or epileptiform discharges were seen throughout the recording.   CT Head 06/30/2023 1. Stable postoperative changes from left  frontoparietal craniotomy for evacuation of a left convexity subdural hematoma with trace residual blood products along the evacuation site. 2. Unchanged interhemispheric subdural hematoma measuring up to 12 mm in thickness. 3. Unchanged minimal rightward midline shift.  Procedures:  2/14:Left-sided emergent craniotomy for hematoma evacuation   Traumatic subdural hematoma with left-to-right midline shift --S/p left craniotomy done on 2/14 --Drain removed on 2/16 - Continue keppra - ok to resume home Eliquis on POD 14 (2/28). OK to give DVT prophylaxis - repeat head CT around 4 weeks post-op. Pt may need an MMA embo in the future as per NeuroSx    UTI --2/21 UA positive --Started ceftriaxone 1 g IV daily x 5 days -- urine culture 80K GNR--E coli and citrobacter--pan sensitive -- blood culture neg   Oral thrush 2/21 Started IV diflucan, switch to oral via tube after PEG tube insertion    Altered Mental Status Seizure Activity likely in the setting of SDH -Repeat imaging as above stable -Seizure prophylaxis with Keppra -Lorazepam/Versed PRN for breakthrough seizure --Patient was seen by neurology.   Acute Blood Loss Anemia due to SDH Hx of IDA --Hgb 6.1 on presentation s/p 2units PRBCs --hgb 12.6  Paroxysmal AF rate controlled HTN HLD Hx of LVH -Hold diltiazem, spironolactone, Imdur for now  --S/p Nicardipine gtt --Hold Eliquis, resume on POD 14 (2/28) --Continue Atorvastatin once able to take po -- amlodipine, metoprolol, PRN labetalol   Anxiety and Depression --continue home Citalopram  and Lorazepam    Hypomagnesemia, mag repleted.  Vitamin D deficiency: Started vitamin D oral supplement. -- follow with PCP    Dysphagia secondary to subdural hemorrhage --Continue NG feeding SLP and nutritionist following 2/21 discussed with patient's family, agreed for PEG tube placement Secure chat/text sent to IR to keep her on the schedule for PEG tube on  Monday --2/22--per RN--Dobhoff appears to have clogged. Tried coke flush also. Will hold feeding for now and get PEG placement on Monday --2/23-- started on dextrose to keep sugars stable. Tube feeding on hold. Discussed with RN to give meds which are crushable through NG --2/24-- patient getting IV dextrose. Tube feeding on hold due to clogging of NG tube. IR plans to place peg tube tomorrow.  Right shoulder stiffness/pain -- portable x-ray shoulder -- PRN pain meds   discussed with daughter on the phone. TOC for discharge planning to rehab.      Diet: NG tube feeding--onhold DVT Prophylaxis: heparin Advance goals of care discussion: Full code Family communication :dter at bedside Consults : neurosurgery, IR  Level of care: Telemetry Medical Status is: Inpatient Remains inpatient appropriate because: subdural hematoma, UTI, needs peg placement    TOTAL TIME TAKING CARE OF THIS PATIENT: 45 minutes.  >50% time spent on counselling and coordination of care  Note: This dictation was prepared with Dragon dictation along with smaller phrase technology. Any transcriptional errors that result from this process are unintentional.  Enedina Finner M.D    [facility]   CC: Primary care physician; [name], MD

## 2023-07-05 NOTE — Progress Notes (Incomplete)
 Neurosurgery Progress Note  History: [name] is here for subdural hematoma s/p craniotomy for evacuation on 06/25/2023. She is currently 11 Days Post-Op  POD11: Pt had PEG placed yesterday. POD9: Continues to have waxing and waning mental status POD7: Pt transferred to floor. POD5: waxing and waning mental status.  Sleepy today. POD4: RN was told she had an episode of lucidity overnight but has been confused and moaning this morning. Pain appears to have improved with PRNs. Does not appear to be grabbing for her head.  POD3: continues confusion and trouble with speech this morning POD2: She had agitation overnight.  Continues to have difficulty with speech. POD1: She is agitated but has improved compared to pre-op.  Physical Exam: Vitals:   07/06/23 0436 07/06/23 0818  BP: (!) 137/49 (!) 145/58  Pulse: 92 87  Resp: 18 15  Temp: 97.9 F (36.6 C) 98.8 F (37.1 C)  SpO2: 100% 95%   Speech is unclear. Eyes open spontaneously. Purposeful with LUE and LLE. RUE continued tone but weaker withdrawal in RLE. CNI with  R facial droop Incision is clean dry and intact with staples in place.  Data:  Index Imaging: Other tests/results:  CT head 2/16  FINDINGS: Brain: No significant reaccumulation of the recently evaluated subdural hematoma on the left, on a few coronal slices it is possible there is trace increase in blood products along the craniotomy flap. Dominant residual is still left parafalcine at the frontal parietal junction measuring up to 12 mm in thickness. Trace midline shift without entrapment, stable on coronal reformats. No evidence of infarct   Vascular: No hyperdense vessel or unexpected calcification.   Skull: Unremarkable left-sided craniotomy site.   Sinuses/Orbits: Negative   IMPRESSION: Essentially stable postoperative head CT.  No new abnormality.     Electronically Signed   By: Tiburcio Pea M.D.   On: 06/27/2023 04:11  MRI brain  2/16 IMPRESSION: 1. Post-operative changes from prior left subdural hematoma evacuation. The residual subdural hematoma is stable in size as compared to the head CT performed earlier today. As before, the largest component is located along the posterior falx, measuring 12 mm in thickness. 2. A thin subdural collection (likely hematoma) is also present along the mid and anterior falx, and left cerebral convexity (measuring up to 3 mm in thickness). 3. Mild chronic small vessel ischemic changes within the cerebral white matter and pons. 4. Few chronic microhemorrhages within the left parietal and occipital lobes.     Electronically Signed   By: Jackey Loge D.O.   On: 06/27/2023 17:33  CT Head 06/30/2023 IMPRESSION: 1. Stable postoperative changes from left frontoparietal craniotomy for evacuation of a left convexity subdural hematoma with trace residual blood products along the evacuation site. 2. Unchanged interhemispheric subdural hematoma measuring up to 12 mm in thickness. 3. Unchanged minimal rightward midline shift.     Electronically Signed   By: Orvan Falconer M.D.   On: 06/30/2023 13:35  New Imaging in past 48hrs:  IR GASTROSTOMY TUBE MOD SED Result Date: 07/05/2023 INDICATION: 73 year old female with a history of subdural hematoma after falling requiring intracranial evacuation. She now has persistent dysphagia and requires durable gastrostomy access for enteral nutrition. EXAM: Fluoroscopically guided placement of percutaneous pull-through gastrostomy tube Interventional Radiologist:  Sterling Big, MD MEDICATIONS: 1 mg glucagon intravenous administered by the radiology nurse ANESTHESIA/SEDATION: Versed 1 mg IV; Fentanyl 50 mcg IV administered by the radiology nurse Moderate Sedation Time:  10 minutes The patient's vital signs and  level of consciousness were continuously monitored during the procedure by the interventional radiology nurse under my direct  supervision. CONTRAST:  15mL OMNIPAQUE IOHEXOL 300 MG/ML  SOLN FLUOROSCOPY: Radiation exposure index: 8 mGy reference air kerma COMPLICATIONS: None immediate. PROCEDURE: Informed written consent was obtained from the patient after a thorough discussion of the procedural risks, benefits and alternatives. All questions were addressed. Maximal Sterile Barrier Technique was utilized including caps, mask, sterile gowns, sterile gloves, sterile drape, hand hygiene and skin antiseptic. A timeout was performed prior to the initiation of the procedure. Maximal barrier sterile technique utilized including caps, mask, sterile gowns, sterile gloves, large sterile drape, hand hygiene, and chlorhexadine skin prep. An angled catheter was advanced over a wire under fluoroscopic guidance through the nose, down the esophagus and into the body of the stomach. The stomach was then insufflated with several 100 ml of air. Fluoroscopy confirmed location of the gastric bubble, as well as inferior displacement of the barium stained colon. Under direct fluoroscopic guidance, a single T-tack was placed, and the anterior gastric wall drawn up against the anterior abdominal wall. Percutaneous access was then obtained into the mid gastric body with an 18 gauge sheath needle. Aspiration of air, and injection of contrast material under fluoroscopy confirmed needle placement. An Amplatz wire was advanced in the gastric body and the access needle exchanged for a 9-French vascular sheath. A snare device was advanced through the vascular sheath and an Amplatz wire advanced through the angled catheter. The Amplatz wire was successfully snared and this was pulled up through the esophagus and out the mouth. A 20-French Burnell Blanks MIC-PEG tube was then connected to the snare and pulled through the mouth, down the esophagus, into the stomach and out to the anterior abdominal wall. Hand injection of contrast material confirmed intragastric location. The  T-tack retention suture was then cut. The pull through peg tube was then secured with the external bumper and capped. The patient will be observed for several hours with the newly placed tube on low wall suction to evaluate for any post procedure complication. The patient tolerated the procedure well, there is no immediate complication. IMPRESSION: Successful placement of a 20 French pull through gastrostomy tube. Electronically Signed   By: Malachy Moan M.D.   On: 07/05/2023 15:06   DG Shoulder Right Port Result Date: 07/05/2023 CLINICAL DATA:  Right shoulder pain EXAM: RIGHT SHOULDER - 1 VIEW COMPARISON:  None Available. FINDINGS: Substantial degenerative spurring of the right humeral head and glenoid on the external rotation view attempt, there is a sclerotic band along the anatomic margin of the humeral head which may be projectional due to spur, the appearance is different from prior chest radiographs and raises the possibility of a subtle underlying fracture. This is poorly corroborated on the internal rotation views. There is no glenohumeral or AC joint malalignment. Mild AC joint spurring noted. Irregularity of the right second rim laterally on the external rotation view attempt, query old fracture. IMPRESSION: 1. Substantial degenerative spurring of the right humeral head and glenoid. 2. Sclerotic band along the anatomic margin of the humeral head on the external rotation view attempt, raising the possibility of a subtle underlying fracture. Alternative this could be from humeral head highlighted by exaggerated projection. This is poorly corroborated on the internal rotation views. Consider CT or MRI of the right shoulder for further characterization. 3. Irregularity of the right second rim laterally on the external rotation view attempt, query old fracture. Electronically Signed   By:  Gaylyn Rong M.D.   On: 07/05/2023 14:26     Recent Labs  Lab 07/01/23 0439 07/02/23 0536  07/03/23 0443  NA 135 133* 135  K 4.2 4.4 4.1  CL 99 99 102  CO2 26 26 25   BUN 12 14 17   CREATININE 0.59 0.63 0.68  GLUCOSE 117* 107* 103*  CALCIUM 8.6* 8.7* 9.1   No results for input(s): "AST", "ALT", "ALKPHOS" in the last 168 hours.  Invalid input(s): "TBILI"   Recent Labs  Lab 07/02/23 0536 07/03/23 0443 07/05/23 0015  WBC 8.9 11.3* 8.9  HGB 12.1 12.9 14.5  HCT 35.8* 38.2 40.9  PLT 255 292 307   Recent Labs  Lab 07/05/23 0015  INR 1.0        IR GASTROSTOMY TUBE MOD SED Result Date: 07/05/2023 INDICATION: 73 year old female with a history of subdural hematoma after falling requiring intracranial evacuation. She now has persistent dysphagia and requires durable gastrostomy access for enteral nutrition. EXAM: Fluoroscopically guided placement of percutaneous pull-through gastrostomy tube Interventional Radiologist:  Sterling Big, MD MEDICATIONS: 1 mg glucagon intravenous administered by the radiology nurse ANESTHESIA/SEDATION: Versed 1 mg IV; Fentanyl 50 mcg IV administered by the radiology nurse Moderate Sedation Time:  10 minutes The patient's vital signs and level of consciousness were continuously monitored during the procedure by the interventional radiology nurse under my direct supervision. CONTRAST:  15mL OMNIPAQUE IOHEXOL 300 MG/ML  SOLN FLUOROSCOPY: Radiation exposure index: 8 mGy reference air kerma COMPLICATIONS: None immediate. PROCEDURE: Informed written consent was obtained from the patient after a thorough discussion of the procedural risks, benefits and alternatives. All questions were addressed. Maximal Sterile Barrier Technique was utilized including caps, mask, sterile gowns, sterile gloves, sterile drape, hand hygiene and skin antiseptic. A timeout was performed prior to the initiation of the procedure. Maximal barrier sterile technique utilized including caps, mask, sterile gowns, sterile gloves, large sterile drape, hand hygiene, and chlorhexadine skin  prep. An angled catheter was advanced over a wire under fluoroscopic guidance through the nose, down the esophagus and into the body of the stomach. The stomach was then insufflated with several 100 ml of air. Fluoroscopy confirmed location of the gastric bubble, as well as inferior displacement of the barium stained colon. Under direct fluoroscopic guidance, a single T-tack was placed, and the anterior gastric wall drawn up against the anterior abdominal wall. Percutaneous access was then obtained into the mid gastric body with an 18 gauge sheath needle. Aspiration of air, and injection of contrast material under fluoroscopy confirmed needle placement. An Amplatz wire was advanced in the gastric body and the access needle exchanged for a 9-French vascular sheath. A snare device was advanced through the vascular sheath and an Amplatz wire advanced through the angled catheter. The Amplatz wire was successfully snared and this was pulled up through the esophagus and out the mouth. A 20-French Burnell Blanks MIC-PEG tube was then connected to the snare and pulled through the mouth, down the esophagus, into the stomach and out to the anterior abdominal wall. Hand injection of contrast material confirmed intragastric location. The T-tack retention suture was then cut. The pull through peg tube was then secured with the external bumper and capped. The patient will be observed for several hours with the newly placed tube on low wall suction to evaluate for any post procedure complication. The patient tolerated the procedure well, there is no immediate complication. IMPRESSION: Successful placement of a 20 French pull through gastrostomy tube. Electronically Signed  By: Malachy Moan M.D.   On: 07/05/2023 15:06   DG Shoulder Right Port Result Date: 07/05/2023 CLINICAL DATA:  Right shoulder pain EXAM: RIGHT SHOULDER - 1 VIEW COMPARISON:  None Available. FINDINGS: Substantial degenerative spurring of the right humeral  head and glenoid on the external rotation view attempt, there is a sclerotic band along the anatomic margin of the humeral head which may be projectional due to spur, the appearance is different from prior chest radiographs and raises the possibility of a subtle underlying fracture. This is poorly corroborated on the internal rotation views. There is no glenohumeral or AC joint malalignment. Mild AC joint spurring noted. Irregularity of the right second rim laterally on the external rotation view attempt, query old fracture. IMPRESSION: 1. Substantial degenerative spurring of the right humeral head and glenoid. 2. Sclerotic band along the anatomic margin of the humeral head on the external rotation view attempt, raising the possibility of a subtle underlying fracture. Alternative this could be from humeral head highlighted by exaggerated projection. This is poorly corroborated on the internal rotation views. Consider CT or MRI of the right shoulder for further characterization. 3. Irregularity of the right second rim laterally on the external rotation view attempt, query old fracture. Electronically Signed   By: Gaylyn Rong M.D.   On: 07/05/2023 14:26     Assessment/Plan:  [name] has shown some improvement after subdural hematoma evacuation but has continued speech abnormality and RUE paresis. MRI and head CT reassuring. She is currently 11 Days Post-Op.   - mobilize - pain control - minimize narcotics - ok for DVT prophylaxis from neurosurgical standpoint  - PTOTST - ok to start - Continue keppra - ok to resume home Eliquis on POD 14.  - Will plan for repeat head CT around 4 weeks post-op. Pt may need an MMA embo in the future. - PEG placed 2/24 - Patient continues to have right-sided neglect and trouble with her speech.  We will repeat a head CT today to evaluate for stability.  Manning Charity PA-C Department of Neurosurgery

## 2023-07-05 NOTE — Progress Notes (Signed)
 OT Cancellation Note  Patient Details Name: MARESHA ANASTOS MRN: [id number] DOB: [birth year]   Cancelled Treatment:    Reason Eval/Treat Not Completed: Medical issues which prohibited therapy. Pt s/p PEG tube placement this date and fatigued with brother in room requesting PT be held until tomorrow, MD agreeable. Will attempt to see pt at a future date/time as medically appropriate.   Kathie Dike, M.S. OTR/L  07/05/23, 3:26 PM  ascom [phone]

## 2023-07-05 NOTE — Consult Note (Signed)
 Chief Complaint: Patient was seen in consultation today for sequelae of subdural hematoma, with consideration for percutaneous gastrostomy tube placement.  Referring Provider(s): Dr. Gillis Santa, MD  Supervising Physician: Malachy Moan  Patient Status: [facility] - Out-pt  Patient is Full Code  History of Present Illness: [name] is a 73 y.o. female with PMHx notable for PAF, IBS, diverticulosis, DVT on Eliquis, HTN, IDA, HLD, OSA, anxiety and depression   Per Dr. Remus Blake progress note on 2/21: Patient presented to the ED on 2/14 with chief complaints of severe headache. Per ED reports, patient apparently fell on Monday hitting the back of her head and may have lost consciousness per her brother.  Patient complained of posterior headaches for about 2 to 3 days but refused to seek care.  She went out to lunch today with friends and began complaining of severe headache.  EMS was called and patient transported to the ED.   CT head in ED demonstrated large left subdural hematoma with mass effect on the left lateral ventricles. Patient underwent left-sided emergent craniotomy for hematoma evacuation on 2/14 with drain placement. Patient was admitted to ICU. Drain was removed on 2/16. Dobhoff was placed on 2/18 for dysphasia, and tube feeds initiated. However, Dobhoff is no longer working per [employer], and tube feeds are on hold.   CT Head 06/30/2023 1. Stable postoperative changes from left frontoparietal craniotomy for evacuation of a left convexity subdural hematoma with trace residual blood products along the evacuation site. 2. Unchanged interhemispheric subdural hematoma measuring up to 12 mm in thickness. 3. Unchanged minimal rightward midline shift.  Interventional Radiology was requested for percutaneous gastrostomy tube placement. Request was reviewed and approved by Dr. Archer Asa. Patient is scheduled for same in IR today.  Heparin held this morning. Afebrile. CBC WNL, INR  1.0. VSS. NPO since midnight. Mild penicillin allergy.   Patient is currently resting in bed with her daughter, [name], at the bedside. Patient is non-verbal, but able to track with her eyes. ROS unable to be performed.   Past Medical History:  Diagnosis Date   Bradycardia    Chest pain    Constipation    Depression    Diverticulitis    Diverticulosis of colon    DOE (dyspnea on exertion)    DVT (deep venous thrombosis) (HCC)    Esophageal stricture    Hyperlipemia    Hypertension    IBS (irritable bowel syndrome)    Sleep apnea     Past Surgical History:  Procedure Laterality Date   APPENDECTOMY     BLOOD CLOT  2004   RIGHT LEG   CHOLECYSTECTOMY     COLON SURGERY     CRANIOTOMY Left 06/25/2023   Procedure: CRANIOTOMY HEMATOMA EVACUATION SUBDURAL;  Surgeon: Venetia Night, MD;  Location: [facility] ORS;  Service: Neurosurgery;  Laterality: Left;   GALLBLADDER SURGERY  2003   NISSEN FUNDOPLICATION     PLANTAR FASCIA SURGERY  2007   sigmoid colectomy  10/21/2010   pelvic anastomosis   VAGINAL HYSTERECTOMY      Allergies: Dicyclomine, Lisinopril, Losartan, Ace inhibitors, and Penicillins  Medications: Prior to Admission medications   Medication Sig Start Date End Date Taking? Authorizing Provider  albuterol (PROVENTIL HFA;VENTOLIN HFA) 108 (90 Base) MCG/ACT inhaler Inhale 2 puffs into the lungs every 6 (six) hours as needed for wheezing or shortness of breath. 11/16/17  Yes Wieting, Richard, MD  apixaban (ELIQUIS) 5 MG TABS tablet Take 1 tablet (5 mg total) by  mouth 2 (two) times daily. 11/30/22 12/30/22  Phineas Semen, MD  atorvastatin (LIPITOR) 20 MG tablet Take 20 mg by mouth daily.    [provider]  citalopram (CELEXA) 20 MG tablet Take 20 mg by mouth daily. 04/05/19   [provider]  famotidine (PEPCID) 20 MG tablet Take 20 mg by mouth at bedtime.  03/06/19   [provider]  isosorbide mononitrate (IMDUR) 30 MG 24 hr tablet Take 30 mg by  mouth daily. 02/23/22   [provider]  LORazepam (ATIVAN) 0.5 MG tablet Take 0.5 mg by mouth every 8 (eight) hours as needed. For anxiety    [provider]  metoprolol succinate (TOPROL-XL) 25 MG 24 hr tablet TAKE 1 TABLET BY MOUTH TWICE DAILY FOR 90 DAYS 02/15/18   [provider]  spironolactone (ALDACTONE) 25 MG tablet Take 1 tablet (25 mg total) by mouth daily. 04/20/19 02/28/22  Jodelle Gross, NP     Family History  Problem Relation Age of Onset   Heart disease Mother    Crohn's disease Sister    Colon polyps Sister    Irritable bowel syndrome Brother    Brain cancer Father    Colitis Sister    Diabetes Sister    Breast cancer Cousin    Colon cancer Neg Hx     Social History   Socioeconomic History   Marital status: Divorced    Spouse name: Not on file   Number of children: 1   Years of education: GED   Highest education level: GED or equivalent  Occupational History   Occupation: retired Psychiatric nurse  Tobacco Use   Smoking status: Never   Smokeless tobacco: Never  Vaping Use   Vaping status: Never Used  Substance and Sexual Activity   Alcohol use: No   Drug use: No   Sexual activity: Not on file  Other Topics Concern   Not on file  Social History Narrative   Epworth Sleepiness Score = 9 (03/08/2015)   Lives alone in a one story home.  Has one daughter.  Retired.  Education: GED   Social Drivers of Corporate investment banker Strain: Low Risk  (03/29/2023)   Received from [facility]   Barrera Financial Resource Strain (CARDIA)    Difficulty of Paying Living Expenses: Not hard at all  Food Insecurity: No Food Insecurity (03/29/2023)   Received from [facility]   Hunger Vital Sign    Worried About Running Out of Food in the Last Year: Never true    Ran Out of Food in the Last Year: Never true  Transportation Needs: No Transportation Needs (03/29/2023)   Received from [facility] - Transportation    In the past 12 months, has lack of transportation kept you from medical appointments or from getting medications?: No    Lack of Transportation (Non-Medical): No  Physical Activity: Not on file  Stress: Not on file  Social Connections: Not on file    Review of Systems  Reason unable to perform ROS: altered mental status s/p subdural hematoma.    Vital Signs: BP (!) 138/54 (BP Location: Left Arm)   Pulse 73   Temp 99.2 F (37.3 C)   Resp 18   Ht 5\' 1"  (1.549 m)   Wt 146 lb 13.2 oz (66.6 kg)   SpO2 96%   BMI 27.74 kg/m   Advance Care Plan: The advanced care place/surrogate decision maker  was discussed at the time of visit and the patient did not wish to discuss or was not able to name a surrogate decision maker or provide an advance care plan.  Physical Exam Vitals reviewed.  Constitutional:      Appearance: She is normal weight.  HENT:     Head: Normocephalic.     Comments: Incision site w/ staples s/p craniotomy    Mouth/Throat:     Mouth: Mucous membranes are moist.  Cardiovascular:     Rate and Rhythm: Normal rate and regular rhythm.     Heart sounds: Normal heart sounds.  Pulmonary:     Effort: Pulmonary effort is normal.  Abdominal:     General: Abdomen is flat.     Palpations: Abdomen is soft.     Tenderness: There is no guarding.  Musculoskeletal:     Cervical back: Normal range of motion.     Comments: Right sided weakness  Skin:    General: Skin is warm and dry.  Neurological:     Comments: Spontaneously opens eyes and able to track  Psychiatric:        Mood and Affect: Mood normal.     Imaging: CT HEAD WO CONTRAST ( ) Result Date: 06/30/2023 CLINICAL DATA:  Follow-up subdural hematoma. EXAM: CT HEAD WITHOUT CONTRAST TECHNIQUE: Contiguous axial images were obtained from the base of the skull through the vertex without intravenous contrast. RADIATION DOSE REDUCTION: This exam was performed according to the  departmental dose-optimization program which includes automated exposure control, adjustment of the mA and/or kV according to patient size and/or use of iterative reconstruction technique. COMPARISON:  Head CT 06/27/2023. FINDINGS: Brain: Stable postoperative changes from left frontoparietal craniotomy for evacuation of a left convexity subdural hematoma with trace residual blood products along the evacuation site. Unchanged interhemispheric subdural hematoma measuring up to 12 mm in thickness (coronal image 39 series 4). Unchanged minimal rightward midline shift. No acute hydrocephalus or new loss of gray-white differentiation. Vascular: No hyperdense vessel or unexpected calcification. Skull: Prior left frontoparietal craniotomy. Sinuses/Orbits: No acute finding. Other: None. IMPRESSION: 1. Stable postoperative changes from left frontoparietal craniotomy for evacuation of a left convexity subdural hematoma with trace residual blood products along the evacuation site. 2. Unchanged interhemispheric subdural hematoma measuring up to 12 mm in thickness. 3. Unchanged minimal rightward midline shift. Electronically Signed   By: Orvan Falconer M.D.   On: 06/30/2023 13:35   DG Abd Portable 1V Result Date: 06/29/2023 CLINICAL DATA:  Check feeding catheter placement EXAM: PORTABLE ABDOMEN - 1 VIEW COMPARISON:  None Available. FINDINGS: Weighted feeding catheter is noted at the level of the floors. No obstructive changes are seen. No free air is noted. IMPRESSION: Weighted feeding catheter as described. Electronically Signed   By: Alcide Clever M.D.   On: 06/29/2023 21:52   EEG adult Result Date: 06/28/2023 Charlsie Quest, MD     06/28/2023  2:50 PM Patient Name: CESILY CUOCO MRN: [id number] Epilepsy Attending: Charlsie Quest Referring Physician/Provider: Venetia Night, MD Date: 06/28/2023 Duration: 27.06 mins Patient history: 72yo F with subdural hematoma s/p craniotomy for evacuation on 06/25/2023. EEG to  evaluate for seizure  Level of alertness: Awake, asleep AEDs during EEG study: LEV Technical aspects: This EEG study was done with scalp electrodes positioned according to the 10-20 International system of electrode placement. Electrical activity was reviewed with band pass filter of 1-70Hz , sensitivity of 7 uV/mm, display speed of 71mm/sec with a 60Hz  notched filter applied as appropriate. EEG  data were recorded continuously and digitally stored.  Video monitoring was available and reviewed as appropriate. Description: The posterior dominant rhythm consists of 8-9 Hz activity of moderate voltage (25-35 uV) seen predominantly in posterior head regions, symmetric and reactive to eye opening and eye closing. Sleep was characterized by vertex waves, sleep spindles (12 to 14 Hz), maximal frontocentral region. EEG showed intermittent 3 to 4 Hz delta slowing in left temporal region. Hyperventilation and photic stimulation were not performed.   ABNORMALITY - Intermittent slow, left temporal region IMPRESSION: This study is suggestive of cortical dysfunction arising from left temporal region likely secondary to underlying sub dural hematoma/ cranioplasty. No seizures or epileptiform discharges were seen throughout the recording. Charlsie Quest   MR BRAIN W WO CONTRAST Result Date: 06/27/2023 CLINICAL DATA:  Provided history: Mental status change, unknown cause. Follow-up subdural bleed. EXAM: MRI HEAD WITHOUT AND WITH CONTRAST TECHNIQUE: Multiplanar, multiecho pulse sequences of the brain and surrounding structures were obtained without and with intravenous contrast. CONTRAST:  7mL GADAVIST GADOBUTROL 1 MMOL/ML IV SOLN COMPARISON:  Prior head CT examinations 06/27/2023 and earlier. Brain MRI 03/31/2022. FINDINGS: Brain: Postoperative changes from prior left-sided craniotomy and left subdural hematoma evacuation. The residual subdural hematoma has not significantly changed in size as compared to the head CT performed  earlier today. As before, the largest component is located along the posterior falx, measuring 12 mm in thickness (for instance as seen on series 9, image 38). A thin subdural collection (likely hematoma) is also present along the mid and anterior falx and left cerebral convexity, measuring up to 3 mm in thickness. Multifocal T2 FLAIR hyperintense signal abnormality within the cerebral white matter and pons, nonspecific but compatible with mild chronic small vessel ischemic disease. There are a few chronic microhemorrhages within the left parietal and occipital lobes. Partially empty sella turcica. There is no acute infarct. No evidence of an intracranial mass. No extra-axial fluid collection. No midline shift. No pathologic parenchymal enhancement. Vascular: Maintained flow voids within the proximal large arterial vessels. Skull and upper cervical spine: Left-sided cranioplasty with overlying scalp edema/fluid. Sinuses/Orbits: No mass or acute finding within the imaged orbits. No significant paranasal sinus disease. Other: Trace fluid within left mastoid air cells. IMPRESSION: 1. Post-operative changes from prior left subdural hematoma evacuation. The residual subdural hematoma is stable in size as compared to the head CT performed earlier today. As before, the largest component is located along the posterior falx, measuring 12 mm in thickness. 2. A thin subdural collection (likely hematoma) is also present along the mid and anterior falx, and left cerebral convexity (measuring up to 3 mm in thickness). 3. Mild chronic small vessel ischemic changes within the cerebral white matter and pons. 4. Few chronic microhemorrhages within the left parietal and occipital lobes. Electronically Signed   By: Jackey Loge D.O.   On: 06/27/2023 17:33   CT HEAD WO CONTRAST ( ) Result Date: 06/27/2023 CLINICAL DATA:  Headache with increasing frequency or severity. EXAM: CT HEAD WITHOUT CONTRAST TECHNIQUE: Contiguous axial  images were obtained from the base of the skull through the vertex without intravenous contrast. RADIATION DOSE REDUCTION: This exam was performed according to the departmental dose-optimization program which includes automated exposure control, adjustment of the mA and/or kV according to patient size and/or use of iterative reconstruction technique. COMPARISON:  Yesterday FINDINGS: Brain: No significant reaccumulation of the recently evaluated subdural hematoma on the left, on a few coronal slices it is possible there is trace increase in blood  products along the craniotomy flap. Dominant residual is still left parafalcine at the frontal parietal junction measuring up to 12 mm in thickness. Trace midline shift without entrapment, stable on coronal reformats. No evidence of infarct Vascular: No hyperdense vessel or unexpected calcification. Skull: Unremarkable left-sided craniotomy site. Sinuses/Orbits: Negative IMPRESSION: Essentially stable postoperative head CT.  No new abnormality. Electronically Signed   By: Tiburcio Pea M.D.   On: 06/27/2023 04:11   CT HEAD WO CONTRAST ( ) Result Date: 06/26/2023 CLINICAL DATA:  Subdural hematoma follow-up EXAM: CT HEAD WITHOUT CONTRAST TECHNIQUE: Contiguous axial images were obtained from the base of the skull through the vertex without intravenous contrast. RADIATION DOSE REDUCTION: This exam was performed according to the departmental dose-optimization program which includes automated exposure control, adjustment of the mA and/or kV according to patient size and/or use of iterative reconstruction technique. COMPARISON:  Yesterday FINDINGS: Brain: Interval decompression of subdural hematoma on the left via craniotomy. Residual left parafalcine component is non progressed at up to 12 mm in thickness. Midline shift is essentially resolved. No complicating infarct. No hydrocephalus or masslike finding. Vascular: No hyperdense vessel or unexpected calcification. Skull:  Normal. Negative for fracture or focal lesion. Sinuses/Orbits: No acute finding. IMPRESSION: Resolved midline shift after subdural hematoma evacuation. No complicating features. Electronically Signed   By: Tiburcio Pea M.D.   On: 06/26/2023 10:59   DG Chest Port 1 View Result Date: 06/25/2023 CLINICAL DATA:  Shortness of breath.  Altered mental status. EXAM: PORTABLE CHEST 1 VIEW COMPARISON:  02/28/2022. FINDINGS: Low lung volume. Bilateral lung fields are clear. Bilateral costophrenic angles are clear. Stable cardio-mediastinal silhouette. No acute osseous abnormalities. The soft tissues are within normal limits. IMPRESSION: No active disease. Electronically Signed   By: Jules Schick M.D.   On: 06/25/2023 16:21   CT Head Wo Contrast Result Date: 06/25/2023 CLINICAL DATA:  Head trauma, intracranial arterial injury suspected; ground level fall, head and neck injury, severe headache present EXAM: CT HEAD WITHOUT CONTRAST CT CERVICAL SPINE WITHOUT CONTRAST TECHNIQUE: Multidetector CT imaging of the head and cervical spine was performed following the standard protocol without intravenous contrast. Multiplanar CT image reconstructions of the cervical spine were also generated. RADIATION DOSE REDUCTION: This exam was performed according to the departmental dose-optimization program which includes automated exposure control, adjustment of the mA and/or kV according to patient size and/or use of iterative reconstruction technique. COMPARISON:  Brain MR 03/31/2022 FINDINGS: CT HEAD FINDINGS Brain: There is a large left cerebral convexity subdural hematoma measuring up to 1.1 cm with a extension along the falx in the bilateral tentorial leaflets. There is a 1.2 cm rightward midline shift as well as early uncal herniation. There is mass effect on the left lateral ventricular system with asymmetric enlargement of the left temporal horn, which can be seen in the setting of early ventricular entrapment. No CT evidence  of an acute cortical infarct Vascular: No hyperdense vessel or unexpected calcification. Skull: Normal. Negative for fracture or focal lesion. Sinuses/Orbits: No middle ear or mastoid effusion. Paranasal sinuses are clear. Orbits are unremarkable. Other: None. CT CERVICAL SPINE FINDINGS Alignment: Trace anterolisthesis of C4 on C5 Skull base and vertebrae: No acute fracture. No primary bone lesion or focal pathologic process. Soft tissues and spinal canal: No prevertebral fluid or swelling. No visible canal hematoma. Disc levels:  No CT evidence of high-grade spinal canal stenosis Upper chest: Negative. Other: Negative IMPRESSION: 1. Large left cerebral convexity subdural hematoma measuring up to 1.1 cm with a 1.2  cm rightward midline shift as well as early uncal herniation. 2. Mass effect on the left lateral ventricular system with asymmetric enlargement of the left temporal horn, which can be seen in the setting of early ventricular entrapment. 3. No acute cervical spine fracture. Electronically Signed   By: Lorenza Cambridge M.D.   On: 06/25/2023 14:29   CT Cervical Spine Wo Contrast Result Date: 06/25/2023 CLINICAL DATA:  Head trauma, intracranial arterial injury suspected; ground level fall, head and neck injury, severe headache present EXAM: CT HEAD WITHOUT CONTRAST CT CERVICAL SPINE WITHOUT CONTRAST TECHNIQUE: Multidetector CT imaging of the head and cervical spine was performed following the standard protocol without intravenous contrast. Multiplanar CT image reconstructions of the cervical spine were also generated. RADIATION DOSE REDUCTION: This exam was performed according to the departmental dose-optimization program which includes automated exposure control, adjustment of the mA and/or kV according to patient size and/or use of iterative reconstruction technique. COMPARISON:  Brain MR 03/31/2022 FINDINGS: CT HEAD FINDINGS Brain: There is a large left cerebral convexity subdural hematoma measuring up to  1.1 cm with a extension along the falx in the bilateral tentorial leaflets. There is a 1.2 cm rightward midline shift as well as early uncal herniation. There is mass effect on the left lateral ventricular system with asymmetric enlargement of the left temporal horn, which can be seen in the setting of early ventricular entrapment. No CT evidence of an acute cortical infarct Vascular: No hyperdense vessel or unexpected calcification. Skull: Normal. Negative for fracture or focal lesion. Sinuses/Orbits: No middle ear or mastoid effusion. Paranasal sinuses are clear. Orbits are unremarkable. Other: None. CT CERVICAL SPINE FINDINGS Alignment: Trace anterolisthesis of C4 on C5 Skull base and vertebrae: No acute fracture. No primary bone lesion or focal pathologic process. Soft tissues and spinal canal: No prevertebral fluid or swelling. No visible canal hematoma. Disc levels:  No CT evidence of high-grade spinal canal stenosis Upper chest: Negative. Other: Negative IMPRESSION: 1. Large left cerebral convexity subdural hematoma measuring up to 1.1 cm with a 1.2 cm rightward midline shift as well as early uncal herniation. 2. Mass effect on the left lateral ventricular system with asymmetric enlargement of the left temporal horn, which can be seen in the setting of early ventricular entrapment. 3. No acute cervical spine fracture. Electronically Signed   By: Lorenza Cambridge M.D.   On: 06/25/2023 14:29    Labs:  CBC: Recent Labs    07/01/23 0439 07/02/23 0536 07/03/23 0443 07/05/23 0015  WBC 7.6 8.9 11.3* 8.9  HGB 12.1 12.1 12.9 14.5  HCT 35.0* 35.8* 38.2 40.9  PLT 216 255 292 307    COAGS: Recent Labs    06/25/23 1451  INR 1.4*    BMP: Recent Labs    06/30/23 0727 07/01/23 0439 07/02/23 0536 07/03/23 0443  NA 134* 135 133* 135  K 3.9 4.2 4.4 4.1  CL 99 99 99 102  CO2 25 26 26 25   GLUCOSE 125* 117* 107* 103*  BUN 9 12 14 17   CALCIUM 8.9 8.6* 8.7* 9.1  CREATININE 0.63 0.59 0.63 0.68   GFRNONAA >60 >60 >60 >60    LIVER FUNCTION TESTS: Recent Labs    01/06/23 1739 06/25/23 1451  BILITOT 0.9 0.9  AST 42* 22  ALT 35 21  ALKPHOS 109 94  PROT 6.3* 6.9  ALBUMIN 3.7 4.1    TUMOR MARKERS: No results for input(s): "AFPTM", "CEA", "CA199", "CHROMGRNA" in the last 8760 hours.  Assessment and Plan: Per  Dr. Remus Blake progress note on 2/21: Patient presented to the ED on 2/14 with chief complaints of severe headache. Per ED reports, patient apparently fell on Monday hitting the back of her head and may have lost consciousness per her brother.  Patient complained of posterior headaches for about 2 to 3 days but refused to seek care.  She went out to lunch today with friends and began complaining of severe headache.  EMS was called and patient transported to the ED.   CT head in ED demonstrated large left subdural hematoma with mass effect on the left lateral ventricles. Patient underwent left-sided emergent craniotomy for hematoma evacuation on 2/14 with drain placement. Patient was admitted to ICU. Drain was removed on 2/16. Dobhoff was placed on 2/18 for dysphasia, and tube feeds initiated. However, Dobhoff is no longer working per [employer], and tube feeds are on hold.   CT Head 06/30/2023 1. Stable postoperative changes from left frontoparietal craniotomy for evacuation of a left convexity subdural hematoma with trace residual blood products along the evacuation site. 2. Unchanged interhemispheric subdural hematoma measuring up to 12 mm in thickness. 3. Unchanged minimal rightward midline shift.   Plan for percutaneous gastrostomy tube placement in IR 07/05/23 with Dr. Vella Redhead  Risks and benefits image guided gastrostomy tube placement was discussed with the patient including, but not limited to the need for a barium enema during the procedure, bleeding, infection, peritonitis and/or damage to adjacent structures.  All of the patient's questions were answered, patient is  agreeable to proceed.  Consent signed and in chart.   Thank you for allowing our service to participate in [name] 's care.  Electronically Signed: Jama Flavors, PA-C 07/05/2023, 9:15 AM    I spent a total of 40 Minutes in face to face in clinical consultation, greater than 50% of which was counseling/coordinating care for sequelae of subdural hematoma, with consideration for percutaneous gastrostomy tube placement.

## 2023-07-05 NOTE — Progress Notes (Signed)
 Nutrition Follow-up  DOCUMENTATION CODES:   Not applicable  INTERVENTION:   -Once PEG is placed:   Initiate Osmolite 1.2 @ 20 ml/hr and increase by 10 ml every 4 hours to goal rate of 60 ml/hr.   50 ml free water flush every 4 hours  Tube feeding regimen provides 1728 kcal (100% of needs), 80 grams of protein, and 1181 ml of H2O.  Total free water: 1181 ml daily  NUTRITION DIAGNOSIS:   Inadequate oral intake related to lethargy/confusion as evidenced by NPO status.  Ongoing  GOAL:   Patient will meet greater than or equal to 90% of their needs  Progressing   MONITOR:   Diet advancement, Labs, Weight trends, TF tolerance, Skin, I & O's  REASON FOR ASSESSMENT:   Consult Enteral/tube feeding initiation and management  ASSESSMENT:   73 y/o female with h/o HTN, esophageal stricture, PAF, IDA, diverticulitis, COPD, s/p cardiac catheterization, s/p laparoscopic hiatal hernia repair and Nissen fundoplication 2007, anxiety, depression, SVT, HLD, GERD, DVT, IBS-C and OSA who is admitted with traumatic subdural hematoma with left-to-right midline shift s/p left craniotomy for evacuation of hematoma 2/14 complicated by seizures and AMS.  2/14- s/p left-sided craniotomy for hematoma evacuation  2/18- NGT TF 2/20- s/p BSE- thin liquids 2/21- s/p BSE- ice chips after oral care 2/22- s/p BSE_ NPO  Reviewed I/O's: -779 ml x 24 hours and +7.5 L since admission  UOP: 1.6 L x 24 hours  Case discussed with MD; plan for PEG today. Per discussion with RN and IR, can start TF at 0800 tomorrow (07/06/23).   TF held today in anticipation of PEG. TF has been held since 07/03/23 PM due to clogged NGT. Dextrose was started on 07/04/23 for blood sugar stabilization.   Wt has been stable over the past week.   Per TOC notes, plan for SNF placement.   Medications reviewed and include vitamin D3. No bowel movement since admission recorded.   Labs reviewed: CBGS: 79-108 (inpatient orders for  glycemic control are none).    Diet Order:   Diet Order             Diet NPO time specified  Diet effective now                   EDUCATION NEEDS:   No education needs have been identified at this time  Skin:  Skin Assessment: Skin Integrity Issues: Skin Integrity Issues:: Incisions Incisions: closed lt head  Last BM:  PTA  Height:   Ht Readings from Last 1 Encounters:  06/29/23 5\' 1"  (1.549 m)    Weight:   Wt Readings from Last 1 Encounters:  07/04/23 66.6 kg    Ideal Body Weight:  47.7 kg  BMI:  Body mass index is 27.74 kg/m.  Estimated Nutritional Needs:   Kcal:  1500-1700kcal/day  Protein:  75-85g/day  Fluid:  1.3-1.5L/day    Levada Schilling, RD, LDN, CDCES Registered Dietitian III Certified Diabetes Care and Education Specialist If unable to reach this RD, please use "RD Inpatient" group chat on secure chat between hours of 8am-4 pm daily

## 2023-07-05 NOTE — Progress Notes (Signed)
 PT Cancellation Note  Patient Details Name: [name] MRN: [id number] DOB: 1950/06/26   Cancelled Treatment:    Reason Eval/Treat Not Completed: Other (comment): Pt s/p PEG tube placement this date and fatigued with brother in room requesting PT be held until tomorrow.  Will attempt to see pt at a future date/time as medically appropriate.    Ovidio Hanger PT, DPT 07/05/23, 3:19 PM

## 2023-07-05 NOTE — Telephone Encounter (Signed)
 Patient is currently admitted

## 2023-07-05 NOTE — Progress Notes (Signed)
 Patient clinically stable post IR PEG tube placement per Dr Archer Asa, tolerated well. Vitals stable pre and post procedure. Received Versed 1 mg along with Fentanyl 50 mcg IV for procedure. Neuro status unchanged post procedure from pre procedure. Report given to [facility] RN/ 109 post procedure/recovery/at bedside with questions answered.

## 2023-07-05 NOTE — Care Management Important Message (Signed)
 Important Message  Patient Details  Name: [name] MRN: [id number] Date of Birth: [birth year]   Important Message Given:  Yes - Medicare IM     Cristela Blue, CMA 07/05/2023, 11:37 AM

## 2023-07-05 NOTE — TOC Progression Note (Signed)
 Transition of Care [facility]) - Progression Note    Patient Details  Name: [name] MRN: [id number] Date of Birth: [birth year]  [facility] CM/SW Contact  Saketh Daubert [place], [place] Phone Number: 07/05/2023, 4:16 PM  Clinical Narrative:     CSW called pt's daughter and provides update that referral to [facility] still pending. CSW contacted [facility] and informed they will review referral. Daughter does not currently have any other SNF's she is interested in as she does not know about the local SNFs. CSW directs her to review SNF list on medicare.gov  1615: [facility] can offer a bed. CSW will initiate insurance auth once updated therapy notes are available as insurance will need PT/OT notes within last 48 hours.   Expected Discharge Plan: Skilled Nursing Facility Barriers to Discharge: Insurance Authorization  Expected Discharge Plan and Services     Post Acute Care Choice: IP Rehab, Skilled Nursing Facility Living arrangements for the past 2 months: Single Family Home                                       Social Determinants of Health (SDOH) Interventions SDOH Screenings   Food Insecurity: No Food Insecurity (03/29/2023)   Received from [facility]  Housing: Low Risk  (05/26/2023)   Received from [facility]  Transportation Needs: No Transportation Needs (03/29/2023)   Received from [facility]  Utilities: Not At Risk (03/29/2023)   [facility]  Financial Resource Strain: Low Risk  (03/29/2023)   Received from [facility]  Tobacco Use: Low Risk  (06/25/2023)    Readmission Risk Interventions     No data to display

## 2023-07-05 NOTE — Progress Notes (Signed)
 MEDICATION-RELATED CONSULT NOTE   IR Procedure Consult - Anticoagulant/Antiplatelet PTA/Inpatient Med List Review by Pharmacist    Procedure: PEG tube    Completed: 2/24 1445  Post-Procedural bleeding risk per IR MD assessment:  standard  Antithrombotic medications on inpatient or PTA profile prior to procedure:   heparin sub q 5000 units q12h     (on apixaban PTA but held till 2/28)    Recommended restart time per IR Post-Procedure Guidelines:  6 hrs   Other considerations:      Plan:    restart heparin 5000 units q12h at 2200  Bari Mantis PharmD Clinical Pharmacist 07/05/2023

## 2023-07-06 ENCOUNTER — Inpatient Hospital Stay: Payer: 59

## 2023-07-06 DIAGNOSIS — S065XAA Traumatic subdural hemorrhage with loss of consciousness status unknown, initial encounter: Secondary | ICD-10-CM | POA: Diagnosis not present

## 2023-07-06 LAB — GLUCOSE, CAPILLARY
Glucose-Capillary: 112 mg/dL — ABNORMAL HIGH (ref 70–99)
Glucose-Capillary: 116 mg/dL — ABNORMAL HIGH (ref 70–99)
Glucose-Capillary: 149 mg/dL — ABNORMAL HIGH (ref 70–99)
Glucose-Capillary: 83 mg/dL (ref 70–99)
Glucose-Capillary: 93 mg/dL (ref 70–99)
Glucose-Capillary: 99 mg/dL (ref 70–99)

## 2023-07-06 MED ORDER — APIXABAN 5 MG PO TABS: 5.0000 mg | ORAL_TABLET | Freq: Two times a day (BID) | ORAL | Status: DC

## 2023-07-06 MED ORDER — POLYETHYLENE GLYCOL 3350 17 G PO PACK
17.0000 g | PACK | Freq: Every day | ORAL | Status: DC
  Administered 2023-07-06 – 2023-07-09 (×3): 17 g
  Filled 2023-07-06 (×3): qty 1

## 2023-07-06 MED ORDER — ACETAMINOPHEN 160 MG/5ML PO SOLN
500.0000 mg | ORAL | Status: DC | PRN
  Administered 2023-07-07 – 2023-07-11 (×10): 500 mg via ORAL
  Filled 2023-07-06 (×11): qty 20.3

## 2023-07-06 MED ORDER — ATORVASTATIN CALCIUM 20 MG PO TABS
20.0000 mg | ORAL_TABLET | Freq: Every day | ORAL | Status: DC
  Administered 2023-07-06 – 2023-07-12 (×7): 20 mg
  Filled 2023-07-06 (×7): qty 1

## 2023-07-06 MED ORDER — CITALOPRAM HYDROBROMIDE 20 MG PO TABS
20.0000 mg | ORAL_TABLET | Freq: Every day | ORAL | Status: DC
  Administered 2023-07-06 – 2023-07-12 (×7): 20 mg
  Filled 2023-07-06 (×7): qty 1

## 2023-07-06 MED ORDER — LORAZEPAM 2 MG/ML IJ SOLN
2.0000 mg | Freq: Once | INTRAMUSCULAR | Status: AC
  Administered 2023-07-06: 2 mg via INTRAVENOUS
  Filled 2023-07-06: qty 1

## 2023-07-06 MED ORDER — FLUCONAZOLE 100 MG PO TABS
200.0000 mg | ORAL_TABLET | Freq: Every day | ORAL | Status: AC
  Administered 2023-07-06: 200 mg
  Filled 2023-07-06: qty 2

## 2023-07-06 MED ORDER — LORAZEPAM 0.5 MG PO TABS
0.5000 mg | ORAL_TABLET | Freq: Three times a day (TID) | ORAL | Status: DC | PRN
  Administered 2023-07-07: 0.5 mg
  Filled 2023-07-06: qty 1

## 2023-07-06 MED ORDER — LEVETIRACETAM 100 MG/ML PO SOLN
500.0000 mg | Freq: Two times a day (BID) | ORAL | Status: DC
Start: 2023-07-06 — End: 2023-07-12
  Administered 2023-07-06 – 2023-07-12 (×13): 500 mg
  Filled 2023-07-06 (×14): qty 5

## 2023-07-06 MED ORDER — ISOSORBIDE DINITRATE 30 MG PO TABS
15.0000 mg | ORAL_TABLET | Freq: Two times a day (BID) | ORAL | Status: DC
  Administered 2023-07-07: 15 mg
  Filled 2023-07-06: qty 0.5

## 2023-07-06 MED ORDER — ACETAMINOPHEN 160 MG/5ML PO SOLN
500.0000 mg | Freq: Four times a day (QID) | ORAL | Status: DC | PRN
  Administered 2023-07-06: 500 mg via ORAL
  Filled 2023-07-06 (×2): qty 20.3

## 2023-07-06 MED ORDER — SPIRONOLACTONE 25 MG PO TABS
25.0000 mg | ORAL_TABLET | Freq: Every day | ORAL | Status: DC
  Administered 2023-07-06 – 2023-07-07 (×2): 25 mg
  Filled 2023-07-06 (×2): qty 1

## 2023-07-06 NOTE — Progress Notes (Signed)
 [facility]  - [place] at [facility]   PATIENT NAME: [name]    MR#:  [id number]  DATE OF BIRTH:  [birth year]  SUBJECTIVE:  daughter at bedside. Status post peg tube placement. Tube feeding started. Changed IV meds to oral. Patient continues to have right upper extremity pain. VITALS:  Blood pressure (!) 145/58, pulse 87, temperature 98.8 F (37.1 C), resp. rate 15, height 5\' 1"  (1.549 m), weight 66 kg, SpO2 95%.  PHYSICAL EXAMINATION:   GENERAL:  73 y.o.-year-old patient with no acute distress. LUNGS: Normal breath sounds bilaterally CARDIOVASCULAR: S1, S2 normal. No murmur   ABDOMEN: Soft, nontender, nondistended. PEG+ EXTREMITIES: No  edema b/l.    NEUROLOGIC: right upper and lower extremity weakness  patient is alert and awake unable to communicate   LABORATORY PANEL:  CBC Recent Labs  Lab 07/05/23 0015  WBC 8.9  HGB 14.5  HCT 40.9  PLT 307    Chemistries  Recent Labs  Lab 07/03/23 0443  NA 135  K 4.1  CL 102  CO2 25  GLUCOSE 103*  BUN 17  CREATININE 0.68  CALCIUM 9.1  MG 2.0    Assessment and Plan 73 y.o female with significant PMH of PAF, IBS, diverticulosis, DVT on Eliquis, HTN, IDA, HLD, OSA, anxiety and depression who presented to the ED with chief complaints of severe headache.  Per ED reports, patient apparently fell hitting the back of her head and may have lost consciousness per her brother.    CT Head 2/14 large left subdural hematoma with mass effect on the left lateral ventricles  EEG: This study is suggestive of cortical dysfunction arising from left temporal region likely secondary to underlying sub dural hematoma/ cranioplasty. No seizures or epileptiform discharges were seen throughout the recording.   CT Head 06/30/2023 1. Stable postoperative changes from left frontoparietal craniotomy for evacuation of a left convexity subdural hematoma with trace residual blood products along the evacuation site. 2. Unchanged  interhemispheric subdural hematoma measuring up to 12 mm in thickness. 3. Unchanged minimal rightward midline shift.  Procedures:  2/14:Left-sided emergent craniotomy for hematoma evacuation   Traumatic subdural hematoma with left-to-right midline shift --S/p left craniotomy done on 2/14 --Drain removed on 2/16 - Continue keppra - ok to resume home Eliquis on POD 14 (2/28). OK to give DVT prophylaxis - repeat head CT around 4 weeks post-op. Pt may need an MMA embo in the future as per NeuroSx --2/25--Repeat CT head prelim results--stable    UTI --2/21 UA positive --Started ceftriaxone 1 g IV daily x 5 days -- urine culture 80K GNR--E coli and citrobacter--pan sensitive -- blood culture neg   Oral thrush 2/21 Started IV diflucan, switch to oral via tube after PEG tube insertion    Altered Mental Status Seizure Activity likely in the setting of SDH -Repeat imaging as above stable -Seizure prophylaxis with Keppra -Lorazepam/Versed PRN for breakthrough seizure --Patient was seen by neurology.   Acute Blood Loss Anemia due to SDH Hx of IDA --Hgb 6.1 on presentation s/p 2units PRBCs --hgb 12.6  Paroxysmal AF rate controlled HTN HLD Hx of LVH -Hold diltiazem, spironolactone, Imdur for now  --S/p Nicardipine gtt --Hold Eliquis, resume on POD 14 (2/28) --Continue Atorvastatin once able to take po -- amlodipine, metoprolol, PRN labetalol   Anxiety and Depression --continue home Citalopram  and Lorazepam    Hypomagnesemia, mag repleted.    Vitamin D deficiency: Started vitamin D oral supplement. -- follow with PCP  Dysphagia secondary to subdural hemorrhage --Continue NG feeding SLP and nutritionist following 2/21 discussed with patient's family, agreed for PEG tube placement Secure chat/text sent to IR to keep her on the schedule for PEG tube on Monday --2/22--per RN--Dobhoff appears to have clogged. Tried coke flush also. Will hold feeding for now and get PEG  placement on Monday --2/23-- started on dextrose to keep sugars stable. Tube feeding on hold. Discussed with RN to give meds which are crushable through NG --2/24-- patient getting IV dextrose. Tube feeding on hold due to clogging of NG tube. IR plans to place peg tube tomorrow. --2/25-- peg feeding started. Tolerating it well. IV medications change to via peg tube  Right shoulder stiffness/pain -- portable x-ray shoulder-- shows arthritic changes along with subtle questionable fracture -- PRN pain meds -- Curb sided orthopedic-- recommends MRI right shoulder. Pending results. If abnormal consider ortho consult   discussed with daughter on the phone. TOC for discharge planning to rehab. [facility] has bed [facility] at [facility]. Should be able to discharge if nothing acute on right shoulder.     Diet: PEG tube DVT Prophylaxis: heparin Advance goals of care discussion: Full code Family communication :dter at bedside Consults : neurosurgery, IR  Level of care: Telemetry Medical Status is: Inpatient Remains inpatient appropriate because: subdural hematoma, UTI, s/p peg placement Pending MRI right shoulder results    TOTAL TIME TAKING CARE OF THIS PATIENT: 45 minutes.  >50% time spent on counselling and coordination of care  Note: This dictation was prepared with Dragon dictation along with smaller phrase technology. Any transcriptional errors that result from this process are unintentional.  Enedina Finner M.D    [facility]   CC: Primary care physician; [name], MD

## 2023-07-06 NOTE — Progress Notes (Signed)
 Brief Interventional Radiology Note:  Patient seen for g-tube site s/p placement of 20 Fr pull-through gastrostomy tube on 07/05/23 in IR with Dr. Vella Redhead.   Insertion site is clean, dry, dressed and appropriately tender to palpation. Tube is to low intermittent wall suction on exam. No bleeding or leakage. Per orders, staff did not use for tube feeds until 4hrs post placement.   Ok to continue using g-tube for medicines, tube feeds, free water, etc.  Please call IR for any questions or concerns regarding the g-tube.   Electronically Signed: Jama Flavors, PA-C 07/06/2023, 4:05 PM

## 2023-07-06 NOTE — Progress Notes (Signed)
   07/06/23 1733  Assess: MEWS Score  Temp (!) 100.6 F (38.1 C)  Assess: MEWS Score  MEWS Temp 1  MEWS Systolic 0  MEWS Pulse 0  MEWS RR 1  MEWS LOC 0  MEWS Score 2  MEWS Score Color Yellow  Assess: if the MEWS score is Yellow or Red  Were vital signs accurate and taken at a resting state? Yes  Does the patient meet 2 or more of the SIRS criteria? No  MEWS guidelines implemented  Yes, yellow  Treat  MEWS Interventions Considered administering scheduled or prn medications/treatments as ordered  Take Vital Signs  Increase Vital Sign Frequency  Yellow: Q2hr x1, continue Q4hrs until patient remains green for 12hrs  Escalate  MEWS: Escalate Yellow: Discuss with charge nurse and consider notifying provider and/or RRT  Notify: Charge Nurse/RN  Name of Charge Nurse/RN Notified Verl Bangs, RN  Provider Notification  Provider Name/Title Dr patel  Date Provider Notified 07/06/23  Time Provider Notified 1734  Method of Notification Call  Notification Reason Change in status  Provider response Other (Comment) (see note)  Date of Provider Response 07/06/23  Time of Provider Response 0734  Assess: SIRS CRITERIA  SIRS Temperature  0  SIRS Respirations  0  SIRS Pulse 0  SIRS WBC 0  SIRS Score Sum  0

## 2023-07-06 NOTE — Progress Notes (Signed)
 45 staples removed from left side of head per MD order. Wound is intact with older scabbing areas.

## 2023-07-06 NOTE — Progress Notes (Signed)
 OT Cancellation Note  Patient Details Name: [name] MRN: [id number] DOB: 23-Mar-1951   Cancelled Treatment:    Reason Eval/Treat Not Completed: Patient at procedure or test/ unavailable. Chart reviewed, pt off the floor for Head CT. Will hold and follow up this afternoon as pt available.   Kathie Dike, M.S. OTR/L  07/06/23, 10:57 AM  ascom [phone]

## 2023-07-06 NOTE — Progress Notes (Signed)
 Patient started running low grade temp today at 100.4. MD made aware Tylenol ordered. Temperature rechecked, 100.6 now yellow mews. Made MD aware of same. Per Dr. Eliane Decree if patient runs temperature above 101 to order blood cultures and urine culture. Also to change Tylenol order to q 4 hrs.

## 2023-07-06 NOTE — Progress Notes (Addendum)
 PT Cancellation Note  Patient Details Name: [name] MRN: [id number] DOB: 09-29-1950   Cancelled Treatment:     Pt off floor for head CT due to continued right-sided neglect and trouble with her speech.  Will re-attempt next available date/time per POC as appropriate.  13:26  Therapist returned, pt being transported off floor for MRI. 15:30  45 staples removed from left side of head, pt heavily sleeping  Jannet Askew 07/06/2023, 10:57 AM

## 2023-07-06 NOTE — Plan of Care (Signed)
  Problem: Education: Goal: Knowledge of General Education information will improve Description: Including pain rating scale, medication(s)/side effects and non-pharmacologic comfort measures Outcome: Progressing   Problem: Health Behavior/Discharge Planning: Goal: Ability to manage health-related needs will improve Outcome: Progressing   Problem: Clinical Measurements: Goal: Ability to maintain clinical measurements within normal limits will improve Outcome: Progressing Goal: Will remain free from infection Outcome: Progressing Goal: Respiratory complications will improve Outcome: Progressing Goal: Cardiovascular complication will be avoided Outcome: Progressing   Problem: Nutrition: Goal: Adequate nutrition will be maintained Outcome: Progressing   Problem: Coping: Goal: Level of anxiety will decrease Outcome: Progressing   Problem: Elimination: Goal: Will not experience complications related to bowel motility Outcome: Progressing Goal: Will not experience complications related to urinary retention Outcome: Progressing   Problem: Pain Managment: Goal: General experience of comfort will improve and/or be controlled Outcome: Progressing   Problem: Safety: Goal: Ability to remain free from injury will improve Outcome: Progressing

## 2023-07-07 ENCOUNTER — Inpatient Hospital Stay: Payer: 59

## 2023-07-07 DIAGNOSIS — S065XAA Traumatic subdural hemorrhage with loss of consciousness status unknown, initial encounter: Secondary | ICD-10-CM | POA: Diagnosis not present

## 2023-07-07 LAB — URINALYSIS, COMPLETE (UACMP) WITH MICROSCOPIC
Bilirubin Urine: NEGATIVE
Glucose, UA: NEGATIVE mg/dL
Ketones, ur: NEGATIVE mg/dL
Nitrite: NEGATIVE
Protein, ur: NEGATIVE mg/dL
RBC / HPF: >50 RBC/hpf (ref 0–5)
Specific Gravity, Urine: 1.016 (ref 1.005–1.030)
WBC, UA: >50 WBC/hpf (ref 0–5)
pH: 6 (ref 5.0–8.0)

## 2023-07-07 LAB — GASTROINTESTINAL PANEL BY PCR, STOOL (REPLACES STOOL CULTURE)

## 2023-07-07 LAB — COMPREHENSIVE METABOLIC PANEL
ALT: 57 U/L — ABNORMAL HIGH (ref 0–44)
AST: 50 U/L — ABNORMAL HIGH (ref 15–41)
Albumin: 3.4 g/dL — ABNORMAL LOW (ref 3.5–5.0)
Alkaline Phosphatase: 193 U/L — ABNORMAL HIGH (ref 38–126)
Anion gap: 10 (ref 5–15)
BUN: 24 mg/dL — ABNORMAL HIGH (ref 8–23)
CO2: 23 mmol/L (ref 22–32)
Calcium: 9.2 mg/dL (ref 8.9–10.3)
Chloride: 98 mmol/L (ref 98–111)
Creatinine, Ser: 0.74 mg/dL (ref 0.44–1.00)
GFR, Estimated: >60 mL/min (ref >60)
Glucose, Bld: 136 mg/dL — ABNORMAL HIGH (ref 70–99)
Potassium: 4.6 mmol/L (ref 3.5–5.1)
Sodium: 131 mmol/L — ABNORMAL LOW (ref 135–145)
Total Bilirubin: 0.5 mg/dL (ref 0.0–1.2)
Total Protein: 6.9 g/dL (ref 6.5–8.1)

## 2023-07-07 LAB — RESPIRATORY PANEL BY PCR

## 2023-07-07 LAB — CULTURE, BLOOD (ROUTINE X 2)
Culture: NO GROWTH
Culture: NO GROWTH
Special Requests: ADEQUATE
Special Requests: ADEQUATE

## 2023-07-07 LAB — GLUCOSE, CAPILLARY
Glucose-Capillary: 93 mg/dL (ref 70–99)
Glucose-Capillary: 94 mg/dL (ref 70–99)
Glucose-Capillary: 106 mg/dL — ABNORMAL HIGH (ref 70–99)
Glucose-Capillary: 126 mg/dL — ABNORMAL HIGH (ref 70–99)
Glucose-Capillary: 136 mg/dL — ABNORMAL HIGH (ref 70–99)
Glucose-Capillary: 89 mg/dL (ref 70–99)

## 2023-07-07 LAB — CBC WITH DIFFERENTIAL/PLATELET
Abs Immature Granulocytes: 0.05 10*3/uL (ref 0.00–0.07)
Basophils Absolute: 0 10*3/uL (ref 0.0–0.1)
Basophils Relative: 0 %
Eosinophils Absolute: 0.2 10*3/uL (ref 0.0–0.5)
Eosinophils Relative: 2 %
HCT: 38 % (ref 36.0–46.0)
Hemoglobin: 13.1 g/dL (ref 12.0–15.0)
Immature Granulocytes: 1 %
Lymphocytes Relative: 20 %
Lymphs Abs: 2.1 10*3/uL (ref 0.7–4.0)
MCH: 30.5 pg (ref 26.0–34.0)
MCHC: 34.5 g/dL (ref 30.0–36.0)
MCV: 88.6 fL (ref 80.0–100.0)
Monocytes Absolute: 1 10*3/uL (ref 0.1–1.0)
Monocytes Relative: 9 %
Neutro Abs: 7 10*3/uL (ref 1.7–7.7)
Neutrophils Relative %: 68 %
Platelets: 394 10*3/uL (ref 150–400)
RBC: 4.29 MIL/uL (ref 3.87–5.11)
RDW: 14 % (ref 11.5–15.5)
WBC: 10.3 10*3/uL (ref 4.0–10.5)
nRBC: 0 % (ref 0.0–0.2)

## 2023-07-07 LAB — RESP PANEL BY RT-PCR (RSV, FLU A&B, COVID)  RVPGX2
Influenza A by PCR: NEGATIVE
Influenza B by PCR: NEGATIVE
Resp Syncytial Virus by PCR: NEGATIVE
SARS Coronavirus 2 by RT PCR: NEGATIVE

## 2023-07-07 MED ORDER — SODIUM CHLORIDE 0.9 % IV BOLUS
250.0000 mL | Freq: Once | INTRAVENOUS | Status: AC
  Administered 2023-07-07: 250 mL via INTRAVENOUS

## 2023-07-07 NOTE — Progress Notes (Signed)
 Nutrition Follow-up  DOCUMENTATION CODES:   Not applicable  INTERVENTION:   -Continue TF via PEG:    Osmolite 1.2 @ 60 ml/hr    50 ml free water flush every 4 hours   Tube feeding regimen provides 1728 kcal (100% of needs), 80 grams of protein, and 1181 ml of H2O.  Total free water: 1181 ml daily  NUTRITION DIAGNOSIS:   Inadequate oral intake related to lethargy/confusion as evidenced by NPO status.  Ongoing  GOAL:   Patient will meet greater than or equal to 90% of their needs  Met with TF  MONITOR:   Diet advancement, Labs, Weight trends, TF tolerance, Skin, I & O's  REASON FOR ASSESSMENT:   Consult Enteral/tube feeding initiation and management  ASSESSMENT:   73 y/o female with h/o HTN, esophageal stricture, PAF, IDA, diverticulitis, COPD, s/p cardiac catheterization, s/p laparoscopic hiatal hernia repair and Nissen fundoplication 2007, anxiety, depression, SVT, HLD, GERD, DVT, IBS-C and OSA who is admitted with traumatic subdural hematoma with left-to-right midline shift s/p left craniotomy for evacuation of hematoma 2/14 complicated by seizures and AMS.  2/14- s/p left-sided craniotomy for hematoma evacuation  2/18- NGT TF 2/20- s/p BSE- thin liquids 2/21- s/p BSE- ice chips after oral care 2/22- s/p BSE- NPO 2/24- s/p PEG 2/25- TF resumed  Reviewed I/O's: -650 ml x 24 hours and +7.9 L since admission  UOP: 650 ml x 24 hours   Pt working with PT at time of visit.   Pt s/p PEG placement. She is NPO and receives TF for sole source nutrition: Osmolite 1.2 infusing at goal rate of 60 ml/hr.   No new wt since last visit.  Per TOC notes, plan for SNF placement once medically stable.   Medications reviewed and include vitamin D3, miralax, and aldactone.   Labs reviewed: CBGS: 93-149 (inpatient orders for glycemic control are none).    Diet Order:   Diet Order             Diet NPO time specified Except for: Ice Chips  Diet effective now                    EDUCATION NEEDS:   No education needs have been identified at this time  Skin:  Skin Assessment: Skin Integrity Issues: Skin Integrity Issues:: Incisions Incisions: closed lt head  Last BM:  07/06/23 (type 7)  Height:   Ht Readings from Last 1 Encounters:  07/05/23 5\' 1"  (1.549 m)    Weight:   Wt Readings from Last 1 Encounters:  07/05/23 66 kg    Ideal Body Weight:  47.7 kg  BMI:  Body mass index is 27.49 kg/m.  Estimated Nutritional Needs:   Kcal:  1500-1700kcal/day  Protein:  75-85g/day  Fluid:  1.3-1.5L/day    Levada Schilling, RD, LDN, CDCES Registered Dietitian III Certified Diabetes Care and Education Specialist If unable to reach this RD, please use "RD Inpatient" group chat on secure chat between hours of 8am-4 pm daily

## 2023-07-07 NOTE — Progress Notes (Signed)
 Occupational Therapy Treatment Patient Details Name: [name] MRN: [id number] DOB: 1950-12-10 Today's Date: 07/07/2023   History of present illness 73 y.o female with significant PMH of PAF, IBS, diverticulosis, DVT on Eliquis, HTN, IDA, HLD, OSA, anxiety and depression who presented to the ED with chief complaints of severe headache. Admitted with traumatic subdural hematoma s/p craniotomy for evacuation on 06/25/2023.   OT comments  Ms Borras was seen for OT treatment on this date. Upon arrival to room pt in bed with family agreeable to tx. Pt requires MIN A sup>sit, on sitting requires MAX - MIN A static sitting balance. MAX A hand over hand face washing using non-dominant L hand. MAX A don B socks in sitting and don/doff gown in sitting, +2 for trunk control. MAX A x2 sit<>stand at EOB for 2 trials with urine incontinence in standing. Pt making good progress toward goals, will continue to follow POC. Discharge recommendation remains appropriate.        If plan is discharge home, recommend the following:  Two people to help with walking and/or transfers;Two people to help with bathing/dressing/bathroom   Equipment Recommendations  Other (comment) (defer)    Recommendations for Other Services      Precautions / Restrictions Precautions Precautions: Fall Recall of Precautions/Restrictions: Impaired Restrictions Weight Bearing Restrictions Per Provider Order: No       Mobility Bed Mobility Overal bed mobility: Needs Assistance Bed Mobility: Supine to Sit, Sit to Supine, Rolling Rolling: Mod assist   Supine to sit: Min assist, +2 for safety/equipment, HOB elevated Sit to supine: Max assist, +2 for physical assistance        Transfers Overall transfer level: Needs assistance Equipment used: 2 person hand held assist Transfers: Sit to/from Stand Sit to Stand: Max assist, +2 physical assistance                 Balance Overall balance assessment: Needs  assistance Sitting-balance support: Feet supported Sitting balance-Leahy Scale: Zero     Standing balance support: Bilateral upper extremity supported, During functional activity Standing balance-Leahy Scale: Poor                             ADL either performed or assessed with clinical judgement   ADL Overall ADL's : Needs assistance/impaired                                       General ADL Comments: MAX A hand over hand face washing using non-dominant L hand. MAX A don B socks in sitting, +2 for trunk control. MAX A don/doff gown in sitting    Extremity/Trunk Assessment Upper Extremity Assessment Upper Extremity Assessment: RUE deficits/detail RUE Deficits / Details: grossly 0/5, generally flaccid with mild tone noted at end range during PROM. SLING ordered RUE: Unable to fully assess due to pain;Unable to fully assess due to immobilization                     Communication Communication Communication: Impaired Factors Affecting Communication: Difficulty expressing self   Cognition Arousal: Alert, Lethargic Behavior During Therapy: Flat affect Cognition: Cognition impaired         Attention impairment (select first level of impairment): Sustained attention                     Following commands: Impaired  Cueing   Cueing Techniques: Verbal cues, Tactile cues, Gestural cues, Visual cues  Exercises      Shoulder Instructions       General Comments      Pertinent Vitals/ Pain       Pain Assessment Pain Assessment: PAINAD Negative Vocalization: occasional moan/groan, low speech, negative/disapproving quality Facial Expression: smiling or inexpressive Body Language: relaxed Consolability: distracted or reassured by voice/touch   Frequency  Min 1X/week        Progress Toward Goals  OT Goals(current goals can now be found in the care plan section)  Progress towards OT goals: Progressing toward  goals  Acute Rehab OT Goals Patient Stated Goal: to return home OT Goal Formulation: With family Time For Goal Achievement: 07/11/23 Potential to Achieve Goals: Fair ADL Goals Pt Will Perform Eating: with set-up;with supervision;bed level Pt Will Perform Grooming: with min assist;sitting Pt Will Transfer to Toilet: with mod assist;squat pivot transfer;bedside commode  Plan      Co-evaluation    PT/OT/SLP Co-Evaluation/Treatment: Yes Reason for Co-Treatment: Complexity of the patient's impairments (multi-system involvement);Necessary to address cognition/behavior during functional activity;For patient/therapist safety;To address functional/ADL transfers PT goals addressed during session: Mobility/safety with mobility;Balance;Proper use of DME;Strengthening/ROM OT goals addressed during session: ADL's and self-care      AM-PAC OT "6 Clicks" Daily Activity     Outcome Measure   Help from another person eating meals?: Total Help from another person taking care of personal grooming?: A Lot Help from another person toileting, which includes using toliet, bedpan, or urinal?: A Lot Help from another person bathing (including washing, rinsing, drying)?: A Lot Help from another person to put on and taking off regular upper body clothing?: A Lot Help from another person to put on and taking off regular lower body clothing?: A Lot 6 Click Score: 11    End of Session Equipment Utilized During Treatment: Gait belt  OT Visit Diagnosis: Other abnormalities of gait and mobility (R26.89);Muscle weakness (generalized) (M62.81)   Activity Tolerance Patient tolerated treatment well   Patient Left in bed;with call bell/phone within reach;with bed alarm set   Nurse Communication Mobility status        Time: 1610-9604 OT Time Calculation (min): 26 min  Charges: OT General Charges $OT Visit: 1 Visit OT Treatments $Self Care/Home Management : 8-22 mins  Kathie Dike, M.S. OTR/L   07/07/23, 9:49 AM  ascom [phone]

## 2023-07-07 NOTE — Plan of Care (Signed)
  Problem: Education: Goal: Knowledge of General Education information will improve Description: Including pain rating scale, medication(s)/side effects and non-pharmacologic comfort measures Outcome: Progressing   Problem: Clinical Measurements: Goal: Ability to maintain clinical measurements within normal limits will improve Outcome: Progressing   Problem: Nutrition: Goal: Adequate nutrition will be maintained Outcome: Progressing   Problem: Elimination: Goal: Will not experience complications related to bowel motility Outcome: Progressing   Problem: Pain Managment: Goal: General experience of comfort will improve and/or be controlled Outcome: Progressing   Problem: Safety: Goal: Ability to remain free from injury will improve Outcome: Progressing   Problem: Skin Integrity: Goal: Risk for impaired skin integrity will decrease Outcome: Progressing

## 2023-07-07 NOTE — Progress Notes (Signed)
 [facility]  - [place] [facility]   PATIENT NAME: [name]    MR#:  [id number]  DATE OF BIRTH:  14-May-1950  SUBJECTIVE:  Resting in bed, non-verbal  VITALS:  Blood pressure (!) 111/50, pulse 85, temperature 98.2 F (36.8 C), resp. rate 20, height 5\' 1"  (1.549 m), weight 66 kg, SpO2 97%.  PHYSICAL EXAMINATION:   GENERAL:  73 y.o.-year-old patient with no acute distress. LUNGS: Normal breath sounds bilaterally CARDIOVASCULAR: S1, S2 normal. No murmur   ABDOMEN: Soft, nontender, nondistended. PEG+ dressing c/d/i EXTREMITIES: No  edema b/l.    NEUROLOGIC: right upper and lower extremity weakness  patient is alert and awake unable to communicate   LABORATORY PANEL:  CBC Recent Labs  Lab 07/05/23 0015  WBC 8.9  HGB 14.5  HCT 40.9  PLT 307    Chemistries  Recent Labs  Lab 07/03/23 0443  NA 135  K 4.1  CL 102  CO2 25  GLUCOSE 103*  BUN 17  CREATININE 0.68  CALCIUM 9.1  MG 2.0    Assessment and Plan 73 y.o female with significant PMH of PAF, IBS, diverticulosis, DVT on Eliquis, HTN, IDA, HLD, OSA, anxiety and depression who presented to the ED with chief complaints of severe headache.  Per ED reports, patient apparently fell hitting the back of her head and may have lost consciousness per her brother.    CT Head 2/14 large left subdural hematoma with mass effect on the left lateral ventricles  EEG: This study is suggestive of cortical dysfunction arising from left temporal region likely secondary to underlying sub dural hematoma/ cranioplasty. No seizures or epileptiform discharges were seen throughout the recording.   CT Head 06/30/2023 1. Stable postoperative changes from left frontoparietal craniotomy for evacuation of a left convexity subdural hematoma with trace residual blood products along the evacuation site. 2. Unchanged interhemispheric subdural hematoma measuring up to 12 mm in thickness. 3. Unchanged minimal rightward midline  shift.  Procedures:  2/14:Left-sided emergent craniotomy for hematoma evacuation   Traumatic subdural hematoma with left-to-right midline shift --S/p left craniotomy done on 2/14 --Drain removed on 2/16 - Continue keppra - ok to resume home Eliquis on POD 14 (2/28). OK to give DVT prophylaxis - repeat head CT around 4 weeks post-op. Pt may need an MMA embo in the future as per NeuroSx --2/25--Repeat CT head prelim results--stable    UTI --2/21 UA positive --Started ceftriaxone 1 g IV daily x 5 days -- urine culture 80K GNR--E coli and citrobacter--pan sensitive -- blood culture neg  Fever Diarrhea New last night. Covid/flu/rsv neg. Nursing reports new cough. Diarrhea but is on tube feeds, had miralax yesterday. Less likely uti as have been covering for culture results from 2/21 but if ongoing fever would need abd imaging to r/o infectious complication - check rvp, cxr, blood cultures, urinalysis, gi pathogen panel   Oral thrush 2/21 Started IV diflucan, switch to oral via tube after PEG tube insertion    Altered Mental Status Seizure Activity likely in the setting of SDH -Repeat imaging as above stable -Seizure prophylaxis with Keppra -Lorazepam/Versed PRN for breakthrough seizure --Patient was seen by neurology.   Acute Blood Loss Anemia due to SDH Hx of IDA --Hgb 6.1 on presentation s/p 2units PRBCs --hgb 12.6  Paroxysmal AF rate controlled HTN HLD Hx of LVH -Hold diltiazem, spironolactone, Imdur for now  --S/p Nicardipine gtt --Hold Eliquis, resume on POD 14 (2/28) --Continue Atorvastatin once able to take po -- amlodipine,  metoprolol, PRN labetalol   Anxiety and Depression --continue home Citalopram  and Lorazepam    Hypomagnesemia, mag repleted.    Vitamin D deficiency: Started vitamin D oral supplement. -- follow with PCP    Dysphagia secondary to subdural hemorrhage --Continue NG feeding SLP and nutritionist following 2/21 discussed with patient's  family, agreed for PEG tube placement Secure chat/text sent to IR to keep her on the schedule for PEG tube on Monday --2/22--per RN--Dobhoff appears to have clogged. Tried coke flush also. Will hold feeding for now and get PEG placement on Monday --2/23-- started on dextrose to keep sugars stable. Tube feeding on hold. Discussed with RN to give meds which are crushable through NG --2/24-- patient getting IV dextrose. Tube feeding on hold due to clogging of NG tube. IR plans to place peg tube tomorrow. --2/25-- peg feeding started. Tolerating it well. IV medications change to via peg tube  Right shoulder stiffness/pain Rotator cuff tear Mri shows supraspinatous tendon tear, biceps tendon tear, degenerative changes - outpt ortho f/u       Diet: PEG tube DVT Prophylaxis: heparin Advance goals of care discussion: Full code Family communication :dter telephonically 2/26 Consults : neurosurgery, IR  Level of care: Telemetry Medical Status is: Inpatient Remains inpatient appropriate because: pending snf placement  Silvano Bilis M.D    [facility]   CC: Primary care physician; [name], MD

## 2023-07-07 NOTE — Progress Notes (Signed)
 Physical Therapy Treatment Patient Details Name: [name] MRN: [id number] DOB: [birth year] Today's Date: 07/07/2023   History of Present Illness Pt is a 73 y.o female with significant PMH of PAF, IBS, diverticulosis, DVT on Eliquis, HTN, IDA, HLD, OSA, anxiety and depression who presented to the ED with chief complaints of severe headache. Admitted with traumatic subdural hematoma s/p craniotomy for evacuation on 06/25/2023 and now s/p G-tube placement on 07/05/23.    PT Comments  Pt required extensive +2 assist for majority of below functional task training and remained non-verbal during the session other than repeating "mama" at times.  Pt required frequent assist during static sitting balance training to prevent LOB in seemingly random planes but occasionally was able to maintain upright position for brief 2-5 sec periods without assist.  Pt was able to participate in two bouts of static standing with +2 HHA and with R knee blocked with max standing time around 20 sec this session.  Pt will benefit from continued PT services upon discharge to safely address deficits listed in patient problem list for decreased caregiver assistance and eventual return to PLOF.     If plan is discharge home, recommend the following: Two people to help with walking and/or transfers;Two people to help with bathing/dressing/bathroom;Assistance with feeding;Direct supervision/assist for medications management;Assistance with cooking/housework;Direct supervision/assist for financial management   Can travel by private vehicle     No  Equipment Recommendations  Other (comment) (TBD at next venue of care)    Recommendations for Other Services       Precautions / Restrictions Precautions Precautions: Fall Recall of Precautions/Restrictions: Impaired Restrictions Weight Bearing Restrictions Per Provider Order: No Other Position/Activity Restrictions: LLQ G-tube placed 07/05/23     Mobility  Bed  Mobility Overal bed mobility: Needs Assistance Bed Mobility: Supine to Sit, Sit to Supine, Rolling Rolling: Mod assist   Supine to sit: Min assist, +2 for safety/equipment, HOB elevated Sit to supine: Max assist, +2 for physical assistance   General bed mobility comments: Assist needed for BLE and trunk control as well as for static sitting balance once sitting at the EOB    Transfers Overall transfer level: Needs assistance Equipment used: 2 person hand held assist Transfers: Sit to/from Stand Sit to Stand: Max assist, +2 physical assistance           General transfer comment: +2 max A to come to standing with R knee blocked for safety    Ambulation/Gait               General Gait Details: unable/unsafe to attempt   Stairs             Wheelchair Mobility     Tilt Bed    Modified Rankin (Stroke Patients Only)       Balance Overall balance assessment: Needs assistance Sitting-balance support: Feet supported, Single extremity supported Sitting balance-Leahy Scale: Poor Sitting balance - Comments: Pt required frequent min to mod A to prevent LOB in sitting in seemingly random planes; with LUE placement on EOB and cuing pt able to maintain upright position for brief periods of time with close CGA   Standing balance support: Bilateral upper extremity supported Standing balance-Leahy Scale: Poor                              Communication Communication Communication: Impaired Factors Affecting Communication: Difficulty expressing self  Cognition Arousal: Alert, Lethargic Behavior During Therapy: Flat affect  PT - Cognitive impairments: Safety/Judgement, Problem solving, Attention, Awareness, Orientation Difficult to assess due to: Impaired communication                     PT - Cognition Comments: multi modal cues required for command following, non verbal Following commands: Impaired Following commands impaired: Follows one  step commands inconsistently    Cueing Cueing Techniques: Verbal cues, Tactile cues, Gestural cues, Visual cues  Exercises Other Exercises Other Exercises: Static sitting balance training and core strengthening at EOB Other Exercises: Static standing for standing balance and improved activity tolerance 2 x 10-20 sec    General Comments        Pertinent Vitals/Pain Pain Assessment Breathing: normal Negative Vocalization: occasional moan/groan, low speech, negative/disapproving quality Facial Expression: smiling or inexpressive Body Language: relaxed Consolability: distracted or reassured by voice/touch PAINAD Score: 2 Pain Intervention(s): Repositioned, Monitored during session    Home Living                          Prior Function            PT Goals (current goals can now be found in the care plan section) Progress towards PT goals: Progressing toward goals    Frequency    Min 1X/week      PT Plan      Co-evaluation PT/OT/SLP Co-Evaluation/Treatment: Yes Reason for Co-Treatment: Complexity of the patient's impairments (multi-system involvement);Necessary to address cognition/behavior during functional activity;For patient/therapist safety;To address functional/ADL transfers PT goals addressed during session: Mobility/safety with mobility;Balance;Proper use of DME;Strengthening/ROM OT goals addressed during session: ADL's and self-care      AM-PAC PT "6 Clicks" Mobility   Outcome Measure  Help needed turning from your back to your side while in a flat bed without using bedrails?: A Lot Help needed moving from lying on your back to sitting on the side of a flat bed without using bedrails?: Total Help needed moving to and from a bed to a chair (including a wheelchair)?: Total Help needed standing up from a chair using your arms (e.g., wheelchair or bedside chair)?: Total Help needed to walk in hospital room?: Total Help needed climbing 3-5 steps with  a railing? : Total 6 Click Score: 7    End of Session Equipment Utilized During Treatment: Gait belt Activity Tolerance: Patient tolerated treatment well Patient left: in bed;with call bell/phone within reach;with bed alarm set;with nursing/sitter in room Nurse Communication: Mobility status PT Visit Diagnosis: Muscle weakness (generalized) (M62.81);Other abnormalities of gait and mobility (R26.89);Hemiplegia and hemiparesis Hemiplegia - Right/Left: Right Hemiplegia - dominant/non-dominant: Dominant Hemiplegia - caused by: Other cerebrovascular disease     Time: 0850-0913 PT Time Calculation (min) (ACUTE ONLY): 23 min  Charges:    $Therapeutic Activity: 8-22 mins PT General Charges $$ ACUTE PT VISIT: 1 Visit                     D. Scott Jarid Sasso PT, DPT 07/07/23, 11:07 AM

## 2023-07-08 ENCOUNTER — Encounter: Payer: 59 | Admitting: Neurosurgery

## 2023-07-08 DIAGNOSIS — S065XAA Traumatic subdural hemorrhage with loss of consciousness status unknown, initial encounter: Secondary | ICD-10-CM | POA: Diagnosis not present

## 2023-07-08 LAB — GLUCOSE, CAPILLARY
Glucose-Capillary: 102 mg/dL — ABNORMAL HIGH (ref 70–99)
Glucose-Capillary: 113 mg/dL — ABNORMAL HIGH (ref 70–99)
Glucose-Capillary: 115 mg/dL — ABNORMAL HIGH (ref 70–99)
Glucose-Capillary: 122 mg/dL — ABNORMAL HIGH (ref 70–99)
Glucose-Capillary: 128 mg/dL — ABNORMAL HIGH (ref 70–99)

## 2023-07-08 MED ORDER — METOPROLOL SUCCINATE ER 25 MG PO TB24
25.0000 mg | ORAL_TABLET | Freq: Every day | ORAL | Status: DC
  Administered 2023-07-08 – 2023-07-12 (×5): 25 mg via ORAL
  Filled 2023-07-08 (×5): qty 1

## 2023-07-08 NOTE — Progress Notes (Signed)
       CROSS COVER NOTE  NAME: [name] MRN: [id number] DOB : Apr 30, 1951 ATTENDING PHYSICIAN: Kathrynn Running, MD    Date of Service   07/08/2023   HPI/Events of Note   MRI WO contrast results called to me from [facility] IMPRESSION: 1. Small acute cortical infarcts within the left frontal and parietal lobes (deep to the cranioplasty). 2. Subdural hematomas overlying the bilateral cerebral hemispheres have not significantly changed in extent. However, new from the prior brain MRI of 06/27/2023, there is apparent restricted diffusion within these collections. This may simply reflect susceptibility artifact from blood products. However, restricted diffusion can also be seen in subdural empyemas and empyemas cannot be excluded by imaging. 4 mm rightward midline shift, unchanged.   Results reported to Dr Katrinka Blazing with neurosurgery - no new change in patient care and they will see patient again in morning        [name] NP [facility] Cross Cover 7pm-7am - check amion for availability Pager [phone]

## 2023-07-08 NOTE — Progress Notes (Signed)
 Physical Therapy Treatment Patient Details Name: [name] MRN: [id number] DOB: 1950/12/06 Today's Date: 07/08/2023   History of Present Illness Pt is a 73 y.o female with significant PMH of PAF, IBS, diverticulosis, DVT on Eliquis, HTN, IDA, HLD, OSA, anxiety and depression who presented to the ED with chief complaints of severe headache. Admitted with traumatic subdural hematoma s/p craniotomy for evacuation on 06/25/2023 and now s/p G-tube placement on 07/05/23.    PT Comments  Pt in bed.  Attempting to verbalize during session and able to communicate with some words and short sentences appropriately but still struggling at times.  She is assisted to sitting with max a x 1 and has trouble maintaining sitting balance today.  She does voice some pain post R calf but does not have any overt signs of DVT like redness, swelling, warmth or pain with flexion to cause alarm.  She is assisted back to supine and is able to participate in BLE AAROM with increased assist on R side but voices no pain as prior.  She does put in good effort for exercises but stated she is fatigued from poor sleep last night.     If plan is discharge home, recommend the following: Two people to help with walking and/or transfers;Two people to help with bathing/dressing/bathroom;Assistance with feeding;Direct supervision/assist for medications management;Assistance with cooking/housework;Direct supervision/assist for financial management   Can travel by private vehicle        Equipment Recommendations       Recommendations for Other Services       Precautions / Restrictions Precautions Precautions: Fall Recall of Precautions/Restrictions: Impaired Restrictions Weight Bearing Restrictions Per Provider Order: No Other Position/Activity Restrictions: LLQ G-tube placed 07/05/23     Mobility  Bed Mobility Overal bed mobility: Needs Assistance Bed Mobility: Supine to Sit, Sit to Supine     Supine to sit: Mod  assist, Max assist Sit to supine: Max assist     Patient Response: Cooperative  Transfers                   General transfer comment: deferred due to pain in RLE post in sitting    Ambulation/Gait               General Gait Details: unable/unsafe to attempt   Stairs             Wheelchair Mobility     Tilt Bed Tilt Bed Patient Response: Cooperative  Modified Rankin (Stroke Patients Only)       Balance Overall balance assessment: Needs assistance Sitting-balance support: Feet supported, Single extremity supported Sitting balance-Leahy Scale: Poor Sitting balance - Comments: +1 assist to remain sitting                                    Communication Communication Communication: Impaired Factors Affecting Communication: Difficulty expressing self  Cognition Arousal: Alert Behavior During Therapy: WFL for tasks assessed/performed                           PT - Cognition Comments: engaged today trying to communicate as able getting some words out that are appropraite. Following commands: Impaired Following commands impaired: Follows one step commands inconsistently    Cueing Cueing Techniques: Verbal cues, Tactile cues, Visual cues, Gestural cues  Exercises Other Exercises Other Exercises: BLE AAROM 2 x 10 with increased assist on R  side    General Comments        Pertinent Vitals/Pain Pain Assessment Pain Assessment: Faces Faces Pain Scale: Hurts even more Pain Location: post R leg Pain Descriptors / Indicators: Crying, Grimacing Pain Intervention(s): Monitored during session, Repositioned    Home Living                          Prior Function            PT Goals (current goals can now be found in the care plan section) Progress towards PT goals: Progressing toward goals    Frequency    Min 1X/week      PT Plan      Co-evaluation              AM-PAC PT "6 Clicks" Mobility    Outcome Measure  Help needed turning from your back to your side while in a flat bed without using bedrails?: A Lot Help needed moving from lying on your back to sitting on the side of a flat bed without using bedrails?: Total Help needed moving to and from a bed to a chair (including a wheelchair)?: Total Help needed standing up from a chair using your arms (e.g., wheelchair or bedside chair)?: Total Help needed to walk in hospital room?: Total Help needed climbing 3-5 steps with a railing? : Total 6 Click Score: 7    End of Session   Activity Tolerance: Patient tolerated treatment well Patient left: in bed;with call bell/phone within reach;with bed alarm set Nurse Communication: Mobility status PT Visit Diagnosis: Muscle weakness (generalized) (M62.81);Other abnormalities of gait and mobility (R26.89);Hemiplegia and hemiparesis Hemiplegia - Right/Left: Right Hemiplegia - dominant/non-dominant: Dominant Hemiplegia - caused by: Other cerebrovascular disease     Time: 0102-7253 PT Time Calculation (min) (ACUTE ONLY): 13 min  Charges:    $Therapeutic Exercise: 8-22 mins PT General Charges $$ ACUTE PT VISIT: 1 Visit                   Danielle Dess, PTA 07/08/23, 1:41 PM

## 2023-07-08 NOTE — Progress Notes (Signed)
 Speech Language Pathology Treatment: Cognitive-Linquistic;Dysphagia  Patient Details Name: [name] MRN: [id number] DOB: 1950/12/22 Today's Date: 07/08/2023 Time: 4098-1191 SLP Time Calculation (min) (ACUTE ONLY): 45 min  Assessment / Plan / Recommendation Clinical Impression  Pt seen for follow up dysphagia and speech-language intervention targeting dynamic assessment and pt/family education. Pt with significant increase in alertness and engagement compared to previous sessions. Pt's daughter, [name], present and engaged during session. Oral care completed and dentures donned independently. Regarding dysphagia, pt seen with trials of ice, thin liquids via straw/spoon, puree, and regular solids. Single cough noted after sip of thin liquids, not repeated with additional trials. Pt indicating discomfort with mastication/swallow for regular solids (suspect residual soreness from thrush and daughter reporting ill fitting dentition), with mildly prolonged mastication and clearance. Education shared regarding considerations for PO intake, including alertness, appetite, and cognition. Recommend trial of Dys 2, with thin liquids. Pt needs assist with self feeding. Based on fluctuations in alertness, recommend supervision t/o PO intake and aspiration precautions (slow rate, small bites, elevated HOB, and alert for PO intake).   Regarding language, pt demonstrated increased spontaneous verbal expression this date. Language profile most consistent with non-fluent Broca's aphasia with additional impact of verbal apraxia. Emerging automatic language (counting/days of the week with model/visual), responsive naming (50% acc), and repetition (one word-phrase level with repetition. Significant challenge with confrontation naming. Pt with 75% acc for one step commands, with increased challenge when advanced to 2 step commands. Yes/no questions with 50% acc, with suspected continued impact of perseveration. Pt with  frustration with language challenges- therapist providing reinforcement across communication attempts. Therapist modeling use of response verification and multimodal communication (with visuals and gesture) for pt/daughter. Education shared regarding language domains, suspected motor planning component, and compensatory strategies. Pt is an excellent rehab candidate based on her motivation seen in session today. SLP will continue to follow.    HPI HPI: 73 y.o female with significant PMH of PAF, IBS, diverticulosis, DVT on Eliquis, HTN, IDA, HLD, OSA, anxiety and depression who presented to the ED with chief complaints of severe headache. Per ED reports, patient apparently fell on Monday hitting the back of her head and may have lost consciousness per her brother. Patient complained of posterior headaches for about 2 to 3 days but refused to seek care. She went out to lunch today with friends and began complaining of severe headache. EMS was called and patient transported to the ED. Admitted 2/14 with traumatic left subdural hematoma s/p emergent crani for hematoma evacuation. Extubated and transferred to the ICU. GI History: Esophageal stricture, per chart review last EGD with dilation completed on 01/25/23. 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 Subdural hematomas overlying the bilateral cerebral hemispheres have not significantly changed in extent. However, new from the prior brain MRI of 06/27/2023, there is apparent restricted diffusion within these collections. This may simply reflect susceptibility artifact from blood products. However, restricted diffusion can also be seen in subdural empyemas and empyemas cannot be excluded by imaging. 4 mm rightward midline shift, unchanged. 07/07/23: Minimal left lung base atelectasis. Pt currently NPO s/p PEG placement      SLP Plan  Continue with current plan of care      Recommendations  for follow up therapy are one component of a multi-disciplinary discharge planning process, led by the attending physician.  Recommendations may be updated based on patient status, additional functional criteria and insurance authorization.  Recommendations  Diet recommendations: Dysphagia 2 (fine chop);Thin liquid Liquids provided via: Cup;Straw Medication Administration: Whole meds with puree Supervision: Staff to assist with self feeding;Full supervision/cueing for compensatory strategies Compensations: Minimize environmental distractions;Slow rate;Small sips/bites Postural Changes and/or Swallow Maneuvers: Seated upright 90 degrees;Upright 30-60 min after meal                  Oral care BID   Frequent or constant Supervision/Assistance Dysphagia, oropharyngeal phase (R13.12);Apraxia (R48.2);Aphasia (R47.01)     Continue with current plan of care    [place] Jojo Pehl Clapp, MS, CCC-SLP Speech Language Pathologist Rehab Services; [facility] [phone] (ascom)   [name]  07/08/2023, 1:57 PM

## 2023-07-08 NOTE — Plan of Care (Signed)
 Neurology plan of care  Neurology reconsulted for 2 small acute infarcts R frontal and parietal cortices. We will see the patient tomorrow AM. Neurosurgery guidance was previously to restart home eliquis post-op day 14 which is tomorrow. I am ok restarting anticoagulation tomorrow since stroke burden is small, but I would recommend starting with heparin gtt (stroke protocol, low goal, no bolus) x48 hrs then transitioning her to eliquis 48 hrs later if she is still doing well.   We will see the patient for formal reconsultation tomorrow.  Bing Neighbors, MD [facility] [phone]  If 7pm- 7am, please page neurology on call as listed in AMION.

## 2023-07-08 NOTE — Progress Notes (Signed)
 [facility]  - [place] [facility]   PATIENT NAME: Woodrow Dulski    MR#:  [id number]  DATE OF BIRTH:  1951-01-26  SUBJECTIVE:  Resting in bed, much more interactive today  VITALS:  Blood pressure 135/68, pulse (!) 107, temperature 97.7 F (36.5 C), resp. rate 15, height 5\' 1"  (1.549 m), weight 59.6 kg, SpO2 98%.  PHYSICAL EXAMINATION:   GENERAL:  73 y.o.-year-old patient with no acute distress. LUNGS: Normal breath sounds bilaterally CARDIOVASCULAR: S1, S2 normal. No murmur   ABDOMEN: Soft, nontender, nondistended. PEG+ dressing c/d/i EXTREMITIES: No  edema b/l.    NEUROLOGIC: moving all 4   LABORATORY PANEL:  CBC Recent Labs  Lab 07/07/23 1358  WBC 10.3  HGB 13.1  HCT 38.0  PLT 394    Chemistries  Recent Labs  Lab 07/03/23 0443 07/07/23 1358  NA 135 131*  K 4.1 4.6  CL 102 98  CO2 25 23  GLUCOSE 103* 136*  BUN 17 24*  CREATININE 0.68 0.74  CALCIUM 9.1 9.2  MG 2.0  --   AST  --  50*  ALT  --  57*  ALKPHOS  --  193*  BILITOT  --  0.5    Assessment and Plan 73 y.o female with significant PMH of PAF, IBS, diverticulosis, DVT on Eliquis, HTN, IDA, HLD, OSA, anxiety and depression who presented to the ED with chief complaints of severe headache.  Per ED reports, patient apparently fell hitting the back of her head and may have lost consciousness per her brother.     Procedures:  2/14:Left-sided emergent craniotomy for hematoma evacuation   Traumatic subdural hematoma with left-to-right midline shift --S/p left craniotomy done on 2/14 --Drain removed on 2/16 - Continue keppra - ok to resume home Eliquis on POD 14 (2/28). OK to give DVT prophylaxis - repeat head CT around 4 weeks post-op. Pt may need an MMA embo in the future as per NeuroSx --2/25--Repeat CT head stable  Acute cva Seen on CT confirmed with mri - neurology to see - plan to re-start apixaban tomorrow - continue atorva at 20, f/u lipid panel tomorrow - tte     UTI --2/21 UA positive --Started ceftriaxone 1 g IV daily x 5 days -- urine culture 80K GNR--E coli and citrobacter--pan sensitive -- blood culture neg  Fever Diarrhea New 2/25 night. Covid/flu/rsv neg. Nursing reports new cough. Diarrhea but is on tube feeds, gi pathogen panel neg. Less likely uti as have been covering culture results. Rvp negative. Cxr nothing acute. No fevers since - monitor   Oral thrush 2/21 Started IV diflucan, switch to oral via tube after PEG tube insertion    Altered Mental Status Seizure Activity likely in the setting of SDH -Repeat imaging as above stable -Seizure prophylaxis with Keppra -Lorazepam/Versed PRN for breakthrough seizure --Patient was seen by neurology.   Acute Blood Loss Anemia due to SDH Hx of IDA --Hgb 6.1 on presentation s/p 2units PRBCs --hgb 12.6  Paroxysmal AF rate controlled HTN HLD Hx of LVH -Hold diltiazem, spironolactone, Imdur for now  --S/p Nicardipine gtt --Hold Eliquis, resume on POD 14 (2/28) --Continue Atorvastatin once able to take po -- amlodipine, metoprolol, PRN labetalol   Anxiety and Depression --continue home Citalopram  and Lorazepam    Hypomagnesemia, mag repleted.    Vitamin D deficiency: Started vitamin D oral supplement. -- follow with PCP    Dysphagia secondary to subdural hemorrhage --Continue NG feeding SLP and nutritionist following 2/21 discussed  with patient's family, agreed for PEG tube placement Secure chat/text sent to IR to keep her on the schedule for PEG tube on Monday --2/22--per RN--Dobhoff appears to have clogged. Tried coke flush also. Will hold feeding for now and get PEG placement on Monday --2/23-- started on dextrose to keep sugars stable. Tube feeding on hold. Discussed with RN to give meds which are crushable through NG --2/24-- patient getting IV dextrose. Tube feeding on hold due to clogging of NG tube. IR plans to place peg tube tomorrow. --2/25-- peg feeding started.  Tolerating it well. IV medications change to via peg tube - 2/27 slp has advanced diet some  Right shoulder stiffness/pain Rotator cuff tear Mri shows supraspinatous tendon tear, biceps tendon tear, degenerative changes - outpt ortho f/u       Diet: PEG tube, dysphagia 2 DVT Prophylaxis: heparin Advance goals of care discussion: Full code Family communication :dter at bedside 2/27 Consults : neurosurgery, IR, neurology  Level of care: Telemetry Medical Status is: Inpatient Remains inpatient appropriate because: pending snf placement  Silvano Bilis M.D    [facility]

## 2023-07-08 NOTE — Plan of Care (Signed)
  Problem: Education: Goal: Knowledge of General Education information will improve Description: Including pain rating scale, medication(s)/side effects and non-pharmacologic comfort measures Outcome: Progressing   Problem: Health Behavior/Discharge Planning: Goal: Ability to manage health-related needs will improve Outcome: Progressing   Problem: Clinical Measurements: Goal: Ability to maintain clinical measurements within normal limits will improve Outcome: Progressing Goal: Will remain free from infection Outcome: Progressing Goal: Respiratory complications will improve Outcome: Progressing Goal: Cardiovascular complication will be avoided Outcome: Progressing   Problem: Activity: Goal: Risk for activity intolerance will decrease Outcome: Progressing   Problem: Nutrition: Goal: Adequate nutrition will be maintained Outcome: Progressing   Problem: Coping: Goal: Level of anxiety will decrease Outcome: Progressing   Problem: Elimination: Goal: Will not experience complications related to bowel motility Outcome: Progressing Goal: Will not experience complications related to urinary retention Outcome: Progressing   Problem: Pain Managment: Goal: General experience of comfort will improve and/or be controlled Outcome: Progressing   Problem: Safety: Goal: Ability to remain free from injury will improve Outcome: Progressing   Problem: Skin Integrity: Goal: Risk for impaired skin integrity will decrease Outcome: Progressing

## 2023-07-09 ENCOUNTER — Inpatient Hospital Stay
Admit: 2023-07-09 | Discharge: 2023-07-09 | Disposition: A | Discharge status: Home / Self Care | Payer: 59 | Attending: Obstetrics and Gynecology | Admitting: Obstetrics and Gynecology

## 2023-07-09 DIAGNOSIS — S065XAA Traumatic subdural hemorrhage with loss of consciousness status unknown, initial encounter: Secondary | ICD-10-CM | POA: Diagnosis not present

## 2023-07-09 LAB — GLUCOSE, CAPILLARY
Glucose-Capillary: 105 mg/dL — ABNORMAL HIGH (ref 70–99)
Glucose-Capillary: 116 mg/dL — ABNORMAL HIGH (ref 70–99)
Glucose-Capillary: 117 mg/dL — ABNORMAL HIGH (ref 70–99)
Glucose-Capillary: 83 mg/dL (ref 70–99)

## 2023-07-09 LAB — LIPID PANEL
Cholesterol: 131 mg/dL (ref 0–200)
HDL: 38 mg/dL — ABNORMAL LOW (ref >40)
LDL Cholesterol: 68 mg/dL (ref 0–99)
Total CHOL/HDL Ratio: 3.4 {ratio}
Triglycerides: 123 mg/dL (ref <150)
VLDL: 25 mg/dL (ref 0–40)

## 2023-07-09 LAB — CBC
HCT: 42 % (ref 36.0–46.0)
Hemoglobin: 14.2 g/dL (ref 12.0–15.0)
MCH: 30.4 pg (ref 26.0–34.0)
MCHC: 33.8 g/dL (ref 30.0–36.0)
MCV: 89.9 fL (ref 80.0–100.0)
Platelets: 534 10*3/uL — ABNORMAL HIGH (ref 150–400)
RBC: 4.67 MIL/uL (ref 3.87–5.11)
RDW: 14 % (ref 11.5–15.5)
WBC: 11.4 10*3/uL — ABNORMAL HIGH (ref 4.0–10.5)
nRBC: 0 % (ref 0.0–0.2)

## 2023-07-09 LAB — APTT: aPTT: 33 s (ref 24–36)

## 2023-07-09 LAB — URINE CULTURE: Culture: >=100000 — AB

## 2023-07-09 LAB — PROTIME-INR
INR: 1 (ref 0.8–1.2)
Prothrombin Time: 13.2 s (ref 11.4–15.2)

## 2023-07-09 LAB — HEPARIN LEVEL (UNFRACTIONATED): Heparin Unfractionated: <0.1 [IU]/mL — ABNORMAL LOW (ref 0.30–0.70)

## 2023-07-09 MED ORDER — MORPHINE SULFATE (PF) 2 MG/ML IV SOLN
1.0000 mg | Freq: Once | INTRAVENOUS | Status: AC
  Administered 2023-07-09: 1 mg via INTRAVENOUS
  Filled 2023-07-09: qty 1

## 2023-07-09 MED ORDER — HEPARIN (PORCINE) 25000 UT/250ML-% IV SOLN
1150.0000 [IU]/h | INTRAVENOUS | Status: DC
  Administered 2023-07-09: 700 [IU]/h via INTRAVENOUS
  Administered 2023-07-10: 1000 [IU]/h via INTRAVENOUS
  Filled 2023-07-09 (×2): qty 250

## 2023-07-09 MED ORDER — NITROFURANTOIN MONOHYD MACRO 100 MG PO CAPS: 100.0000 mg | ORAL_CAPSULE | Freq: Two times a day (BID) | ORAL | Status: DC

## 2023-07-09 NOTE — Plan of Care (Signed)
  Problem: Education: Goal: Knowledge of General Education information will improve Description: Including pain rating scale, medication(s)/side effects and non-pharmacologic comfort measures Outcome: Progressing   Problem: Health Behavior/Discharge Planning: Goal: Ability to manage health-related needs will improve Outcome: Progressing   Problem: Clinical Measurements: Goal: Ability to maintain clinical measurements within normal limits will improve Outcome: Progressing Goal: Will remain free from infection Outcome: Progressing Goal: Respiratory complications will improve Outcome: Progressing Goal: Cardiovascular complication will be avoided Outcome: Progressing   Problem: Activity: Goal: Risk for activity intolerance will decrease Outcome: Progressing   Problem: Nutrition: Goal: Adequate nutrition will be maintained Outcome: Progressing   Problem: Coping: Goal: Level of anxiety will decrease Outcome: Progressing   Problem: Elimination: Goal: Will not experience complications related to bowel motility Outcome: Progressing Goal: Will not experience complications related to urinary retention Outcome: Progressing   Problem: Pain Managment: Goal: General experience of comfort will improve and/or be controlled Outcome: Progressing   Problem: Safety: Goal: Ability to remain free from injury will improve Outcome: Progressing   Problem: Skin Integrity: Goal: Risk for impaired skin integrity will decrease Outcome: Progressing

## 2023-07-09 NOTE — Telephone Encounter (Signed)
 Patient is still admitted.

## 2023-07-09 NOTE — Progress Notes (Signed)
 Subjective: Patient awake and alert.  Remains aphasic and with right sided weakness.  Is starting to eat.    Objective: Current vital signs: BP 133/63 (BP Location: Left Arm)   Pulse 81   Temp 98.4 F (36.9 C) (Oral)   Resp 15   Ht 5' 0.98" (1.549 m)   Wt 60.2 kg   SpO2 99%   BMI 25.09 kg/m  Vital signs in last 24 hours: Temp:  [97.8 F (36.6 C)-99.2 F (37.3 C)] 98.4 F (36.9 C) (02/28 0804) Pulse Rate:  [72-90] 81 (02/28 0804) Resp:  [15-16] 15 (02/28 0804) BP: (111-142)/(56-63) 133/63 (02/28 0804) SpO2:  [99 %-100 %] 99 % (02/28 0804) Weight:  [60.2 kg] 60.2 kg (02/28 0804)  Intake/Output from previous day: 02/27 0701 - 02/28 0700 In: 2220 [P.O.:60; NG/GT:2060] Out: 0  Intake/Output this shift: No intake/output data recorded. Nutritional status:  Diet Order             DIET DYS 2 Room service appropriate? Yes; Fluid consistency: Thin  Diet effective now                   Neurological Examination   Mental Status: Alert and awake,  Follows some nonverbal commands. Aphasic.   Cranial Nerves: II: Blinks to bilateral confrontation.   III,IV, VI: ptosis not present, extra-ocular motions intact bilaterally V,VII: smile symmetric VIII: hearing normal bilaterally Motor: Right : Upper extremity   1/5    Left:     Upper extremity   5/5  Lower extremity   3-/5     Lower extremity   5/5 Tone and bulk:normal tone throughout; no atrophy noted Deep Tendon Reflexes: 2+ and symmetric throughout Cerebellar: Unable to perform Gait: not tested due to safety concerns     Lab Results: Basic Metabolic Panel: Recent Labs  Lab 07/03/23 0443 07/07/23 1358  NA 135 131*  K 4.1 4.6  CL 102 98  CO2 25 23  GLUCOSE 103* 136*  BUN 17 24*  CREATININE 0.68 0.74  CALCIUM 9.1 9.2  MG 2.0  --   PHOS 4.0  --     Liver Function Tests: Recent Labs  Lab 07/07/23 1358  AST 50*  ALT 57*  ALKPHOS 193*  BILITOT 0.5  PROT 6.9  ALBUMIN 3.4*   No results for input(s):  "LIPASE", "AMYLASE" in the last 168 hours. No results for input(s): "AMMONIA" in the last 168 hours.  CBC: Recent Labs  Lab 07/03/23 0443 07/05/23 0015 07/07/23 1358 07/09/23 0910  WBC 11.3* 8.9 10.3 11.4*  NEUTROABS  --  4.8 7.0  --   HGB 12.9 14.5 13.1 14.2  HCT 38.2 40.9 38.0 42.0  MCV 90.7 88.3 88.6 89.9  PLT 292 307 394 534*    Cardiac Enzymes: No results for input(s): "CKTOTAL", "CKMB", "CKMBINDEX", "TROPONINI" in the last 168 hours.  Lipid Panel: Recent Labs  Lab 07/09/23 0504  CHOL 131  TRIG 123  HDL 38*  CHOLHDL 3.4  VLDL 25  LDLCALC 68    CBG: Recent Labs  Lab 07/08/23 1214 07/08/23 1739 07/09/23 0011 07/09/23 0615 07/09/23 0802  GLUCAP 128* 113* 116* 117* 105*    Microbiology: Results for orders placed or performed during the hospital encounter of 06/25/23  Culture, blood (Routine X 2) w Reflex to ID Panel     Status: None   Collection Time: 07/02/23  8:59 AM   Specimen: BLOOD  Result Value Ref Range Status   Specimen Description BLOOD BLOOD LEFT HAND  Final   Special Requests   Final    BOTTLES DRAWN AEROBIC AND ANAEROBIC Blood Culture adequate volume   Culture   Final    NO GROWTH 5 DAYS Performed at [facility], [address] [place], [place] [address]    Report Status 07/07/2023 FINAL  Final  Culture, blood (Routine X 2) w Reflex to ID Panel     Status: None   Collection Time: 07/02/23  9:06 AM   Specimen: BLOOD  Result Value Ref Range Status   Specimen Description BLOOD RW  Final   Special Requests   Final    BOTTLES DRAWN AEROBIC AND ANAEROBIC Blood Culture adequate volume   Culture   Final    NO GROWTH 5 DAYS Performed at [facility], [address], [place], [place] [address]    Report Status 07/07/2023 FINAL  Final  Urine Culture     Status: Abnormal   Collection Time: 07/02/23 12:43 PM   Specimen: Urine, Random  Result Value Ref Range Status   Specimen Description   Final    URINE,  RANDOM Performed at [facility], [address] [place], [place] [address]    Special Requests   Final    NONE Reflexed from [id number] Performed at [facility], [address] [place], [place] [address]    Culture (A)  Final    80,000 COLONIES/mL ESCHERICHIA COLI 10,000 COLONIES/mL CITROBACTER SPECIES    Report Status 07/04/2023 FINAL  Final   Organism ID, Bacteria ESCHERICHIA COLI (A)  Final   Organism ID, Bacteria CITROBACTER SPECIES (A)  Final      Susceptibility   Citrobacter species - MIC*    CEFEPIME <=0.12 SENSITIVE Sensitive     CEFTRIAXONE <=0.25 SENSITIVE Sensitive     CIPROFLOXACIN <=0.25 SENSITIVE Sensitive     GENTAMICIN <=1 SENSITIVE Sensitive     IMIPENEM <=0.25 SENSITIVE Sensitive     NITROFURANTOIN <=16 SENSITIVE Sensitive     TRIMETH/SULFA <=20 SENSITIVE Sensitive     PIP/TAZO <=4 SENSITIVE Sensitive ug/mL    * 10,000 COLONIES/mL CITROBACTER SPECIES   Escherichia coli - MIC*    AMPICILLIN <=2 SENSITIVE Sensitive     CEFAZOLIN <=4 SENSITIVE Sensitive     CEFEPIME <=0.12 SENSITIVE Sensitive     CEFTRIAXONE <=0.25 SENSITIVE Sensitive     CIPROFLOXACIN <=0.25 SENSITIVE Sensitive     GENTAMICIN <=1 SENSITIVE Sensitive     IMIPENEM <=0.25 SENSITIVE Sensitive     NITROFURANTOIN <=16 SENSITIVE Sensitive     TRIMETH/SULFA <=20 SENSITIVE Sensitive     AMPICILLIN/SULBACTAM <=2 SENSITIVE Sensitive     PIP/TAZO <=4 SENSITIVE Sensitive ug/mL    * 80,000 COLONIES/mL ESCHERICHIA COLI  Resp panel by RT-PCR (RSV, Flu A&B, Covid) Anterior Nasal Swab     Status: None   Collection Time: 07/07/23  9:15 AM   Specimen: Anterior Nasal Swab  Result Value Ref Range Status   SARS Coronavirus 2 by RT PCR NEGATIVE NEGATIVE Final    Comment: (NOTE) SARS-CoV-2 target nucleic acids are NOT DETECTED.  The SARS-CoV-2 RNA is generally detectable in upper respiratory specimens during the acute phase of infection. The lowest concentration of SARS-CoV-2 viral copies  this assay can detect is 138 copies/mL. A negative result does not preclude SARS-Cov-2 infection and should not be used as the sole basis for treatment or other patient management decisions. A negative result may occur with  improper specimen collection/handling, submission of specimen other than nasopharyngeal swab, presence of viral mutation(s)  within the areas targeted by this assay, and inadequate number of viral copies(<138 copies/mL). A negative result must be combined with clinical observations, patient history, and epidemiological information. The expected result is Negative.  Fact Sheet for Patients:  BloggerCourse.com  Fact Sheet for Healthcare Providers:  SeriousBroker.it  This test is no t yet approved or cleared by the [place] FDA and  has been authorized for detection and/or diagnosis of SARS-CoV-2 by FDA under an Emergency Use Authorization (EUA). This EUA will remain  in effect (meaning this test can be used) for the duration of the COVID-19 declaration under Section 564(b)(1) of the Act, 21 U.S.C.section 360bbb-3(b)(1), unless the authorization is terminated  or revoked sooner.       Influenza A by PCR NEGATIVE NEGATIVE Final   Influenza B by PCR NEGATIVE NEGATIVE Final    Comment: (NOTE) The Xpert Xpress SARS-CoV-2/FLU/RSV plus assay is intended as an aid in the diagnosis of influenza from Nasopharyngeal swab specimens and should not be used as a sole basis for treatment. Nasal washings and aspirates are unacceptable for Xpert Xpress SARS-CoV-2/FLU/RSV testing.  Fact Sheet for Patients: BloggerCourse.com  Fact Sheet for Healthcare Providers: SeriousBroker.it  This test is not yet approved or cleared by the [place] FDA and has been authorized for detection and/or diagnosis of SARS-CoV-2 by FDA under an Emergency Use Authorization (EUA). This EUA  will remain in effect (meaning this test can be used) for the duration of the COVID-19 declaration under Section 564(b)(1) of the Act, 21 U.S.C. section 360bbb-3(b)(1), unless the authorization is terminated or revoked.     Resp Syncytial Virus by PCR NEGATIVE NEGATIVE Final    Comment: (NOTE) Fact Sheet for Patients: BloggerCourse.com  Fact Sheet for Healthcare Providers: SeriousBroker.it  This test is not yet approved or cleared by the [place] FDA and has been authorized for detection and/or diagnosis of SARS-CoV-2 by FDA under an Emergency Use Authorization (EUA). This EUA will remain in effect (meaning this test can be used) for the duration of the COVID-19 declaration under Section 564(b)(1) of the Act, 21 U.S.C. section 360bbb-3(b)(1), unless the authorization is terminated or revoked.  Performed at [facility], [address] [place], [place] [address]   Culture, blood (Routine X 2) w Reflex to ID Panel     Status: None (Preliminary result)   Collection Time: 07/07/23  1:58 PM   Specimen: BLOOD  Result Value Ref Range Status   Specimen Description BLOOD BLOOD LEFT ARM  Final   Special Requests   Final    BOTTLES DRAWN AEROBIC AND ANAEROBIC Blood Culture adequate volume   Culture   Final    NO GROWTH 2 DAYS Performed at [facility], [address], [place], [place] [address]    Report Status PENDING  Incomplete  Culture, blood (Routine X 2) w Reflex to ID Panel     Status: None (Preliminary result)   Collection Time: 07/07/23  2:05 PM   Specimen: BLOOD  Result Value Ref Range Status   Specimen Description BLOOD BLOOD RIGHT HAND  Final   Special Requests   Final    BOTTLES DRAWN AEROBIC AND ANAEROBIC BLOOD RIGHT HAND   Culture   Final    NO GROWTH 2 DAYS Performed at [facility], [address] [place], [place] [address]    Report Status PENDING  Incomplete  Respiratory  (~20 pathogens) panel by PCR     Status: None   Collection Time: 07/07/23  2:13  PM   Specimen: Nasopharyngeal Swab; Respiratory  Result Value Ref Range Status   Adenovirus NOT DETECTED NOT DETECTED Final   Coronavirus 229E NOT DETECTED NOT DETECTED Final    Comment: (NOTE) The Coronavirus on the Respiratory Panel, DOES NOT test for the novel  Coronavirus (2019 nCoV)    Coronavirus HKU1 NOT DETECTED NOT DETECTED Final   Coronavirus NL63 NOT DETECTED NOT DETECTED Final   Coronavirus OC43 NOT DETECTED NOT DETECTED Final   Metapneumovirus NOT DETECTED NOT DETECTED Final   Rhinovirus / Enterovirus NOT DETECTED NOT DETECTED Final   Influenza A NOT DETECTED NOT DETECTED Final   Influenza B NOT DETECTED NOT DETECTED Final   Parainfluenza Virus 1 NOT DETECTED NOT DETECTED Final   Parainfluenza Virus 2 NOT DETECTED NOT DETECTED Final   Parainfluenza Virus 3 NOT DETECTED NOT DETECTED Final   Parainfluenza Virus 4 NOT DETECTED NOT DETECTED Final   Respiratory Syncytial Virus NOT DETECTED NOT DETECTED Final   Bordetella pertussis NOT DETECTED NOT DETECTED Final   Bordetella Parapertussis NOT DETECTED NOT DETECTED Final   Chlamydophila pneumoniae NOT DETECTED NOT DETECTED Final   Mycoplasma pneumoniae NOT DETECTED NOT DETECTED Final    Comment: Performed at [facility], [address], [place], [place] [address]  Gastrointestinal Panel by PCR , Stool     Status: None   Collection Time: 07/07/23  5:38 PM   Specimen: Stool  Result Value Ref Range Status   Campylobacter species NOT DETECTED NOT DETECTED Final   Plesimonas shigelloides NOT DETECTED NOT DETECTED Final   Salmonella species NOT DETECTED NOT DETECTED Final   Yersinia enterocolitica NOT DETECTED NOT DETECTED Final   Vibrio species NOT DETECTED NOT DETECTED Final   Vibrio cholerae NOT DETECTED NOT DETECTED Final   Enteroaggregative E coli (EAEC) NOT DETECTED NOT DETECTED Final   Enteropathogenic E coli (EPEC) NOT DETECTED NOT  DETECTED Final   Enterotoxigenic E coli (ETEC) NOT DETECTED NOT DETECTED Final   Shiga like toxin producing E coli (STEC) NOT DETECTED NOT DETECTED Final   Shigella/Enteroinvasive E coli (EIEC) NOT DETECTED NOT DETECTED Final   Cryptosporidium NOT DETECTED NOT DETECTED Final   Cyclospora cayetanensis NOT DETECTED NOT DETECTED Final   Entamoeba histolytica NOT DETECTED NOT DETECTED Final   Giardia lamblia NOT DETECTED NOT DETECTED Final   Adenovirus F40/41 NOT DETECTED NOT DETECTED Final   Astrovirus NOT DETECTED NOT DETECTED Final   Norovirus GI/GII NOT DETECTED NOT DETECTED Final   Rotavirus A NOT DETECTED NOT DETECTED Final   Sapovirus (I, II, IV, and V) NOT DETECTED NOT DETECTED Final    Comment: Performed at [facility], [address] [place], [place] [address]  Urine Culture (for pregnant, neutropenic or urologic patients or patients with an indwelling urinary catheter)     Status: Abnormal   Collection Time: 07/07/23  6:21 PM   Specimen: Urine, Clean Catch  Result Value Ref Range Status   Specimen Description   Final    URINE, CLEAN CATCH Performed at [facility], [address], [place], [place] [address]    Special Requests   Final    NONE Performed at [facility], [address] [place], [place] [address]    Culture >=100,000 COLONIES/mL ENTEROCOCCUS FAECALIS (A)  Final   Report Status 07/09/2023 FINAL  Final   Organism ID, Bacteria ENTEROCOCCUS FAECALIS (A)  Final      Susceptibility   Enterococcus faecalis - MIC*    AMPICILLIN <=2 SENSITIVE Sensitive  NITROFURANTOIN <=16 SENSITIVE Sensitive     VANCOMYCIN 1 SENSITIVE Sensitive     * >=100,000 COLONIES/mL ENTEROCOCCUS FAECALIS    Coagulation Studies: Recent Labs    07/09/23 0910  LABPROT 13.2  INR 1.0    Imaging: MR BRAIN WO CONTRAST Result Date: 07/07/2023 CLINICAL DATA:  Provided history: Altered mental status. Possible new stroke. EXAM: MRI HEAD WITHOUT CONTRAST  TECHNIQUE: Multiplanar, multiecho pulse sequences of the brain and surrounding structures were obtained without intravenous contrast. COMPARISON:  Head CT 07/06/2023. Brain MRI 06/27/2023. FINDINGS: Brain: Post-operative changes from prior left-sided craniotomy for subdural hematoma evacuation. The residual subdural hematoma deep to the cranioplasty is unchanged in size as compared to the head CT of 07/06/2023, again measuring up to 8 mm in thickness (remeasured on prior). Unchanged size of a parafalcine component of the left subdural hematoma, again measuring up to 10 mm in thickness. Trace components of the subdural hematoma elsewhere along the left cerebral hemisphere have also not significantly changed in extent. A trace subdural collection (likely hematoma) along portions of the right cerebral hemisphere has undergone some redistribution since the brain MRI of 06/27/2023, but has not significantly changed in extent. New from the prior brain MRI of 06/27/2023, there is apparent restricted diffusion within the bilateral subdural collections described above. Also new from the prior MRI, there are small foci of cortical restricted diffusion within the left frontal and parietal lobes (deep to the cranioplasty) consistent with acute infarcts (for instance as seen on series 5, image 31). 4 mm rightward midline shift, unchanged. Multifocal T2 FLAIR hyperintense signal abnormality within the cerebral white matter and pons, nonspecific but compatible with mild chronic small vessel ischemic disease. Partially empty sella turcica. Vascular: Maintained flow voids within the proximal large arterial vessels. Skull and upper cervical spine: No focal worrisome marrow lesion. Sinuses/Orbits: No mass or acute finding within the imaged orbits. No significant paranasal sinus disease. Other: Fluid collection within the left temporoparietal scalp (overlying the cranioplasty), measuring 4.5 x 1.0 cm in transaxial dimensions.  Impressions #1 and #2 will be called to the ordering clinician or representative by the Radiologist Assistant, and communication documented in the PACS or [employer]. IMPRESSION: 1. Small acute cortical infarcts within the left frontal and parietal lobes (deep to the cranioplasty). 2. Subdural hematomas overlying the bilateral cerebral hemispheres have not significantly changed in extent. However, new from the prior brain MRI of 06/27/2023, there is apparent restricted diffusion within these collections. This may simply reflect susceptibility artifact from blood products. However, restricted diffusion can also be seen in subdural empyemas and empyemas cannot be excluded by imaging. 4 mm rightward midline shift, unchanged. Electronically Signed   By: Jackey Loge D.O.   On: 07/07/2023 21:58   DG Chest Port 1 View Result Date: 07/07/2023 CLINICAL DATA:  Fever. EXAM: PORTABLE CHEST 1 VIEW COMPARISON:  Chest radiograph dated 06/25/2023. FINDINGS: Minimal left lung base atelectasis. Infiltrate is less likely but not excluded. No consolidative changes. There is no pleural effusion or pneumothorax. The cardiac silhouette is within limits. Atherosclerotic calcification of the aorta. No acute osseous pathology. IMPRESSION: Minimal left lung base atelectasis. Electronically Signed   By: Elgie Collard M.D.   On: 07/07/2023 17:38    Medications: I have reviewed the patient's current medications. Scheduled:  atorvastatin  20 mg Per Tube Daily   cholecalciferol  4,000 Units Per Tube BID   citalopram  20 mg Per Tube Daily   famotidine  20 mg Per Tube BID  free water  50 mL Per Tube Q4H   levETIRAcetam  500 mg Per Tube BID   metoprolol succinate  25 mg Oral Daily   mouth rinse  15 mL Mouth Rinse 4 times per day   polyethylene glycol  17 g Per Tube Daily   QUEtiapine  25 mg Per Tube QHS   Continuous:  feeding supplement (OSMOLITE 1.2 CAL) 1,000 mL (07/08/23 1657)   heparin 700 Units/hr (07/09/23 1010)     Assessment/Plan: 73 year old female with SDH. Per neurosurgery guidance was previously to restart home eliquis post-op day 14 which is today.  MRI of the brain performed showing two small acute infarcts in the right frontal and right parietal cortices.  Due to new infarcts although patient can restart anticoagulation would start with heparin, continue for 48 hours before transitioning to Eliquis.    IV heparin to start today.  No bolus Transition to Eliquis in 48 hours STAT head CT for any change in neurological examination to rule out acute hemorrhage.     LOS: 14 days   Thana Farr, MD Neurology  07/09/2023  10:32 AM

## 2023-07-09 NOTE — Progress Notes (Addendum)
 Physical Therapy Treatment Patient Details Name: [name] MRN: [id number] DOB: 09/29/1950 Today's Date: 07/09/2023   History of Present Illness Pt is a 73 y.o female with significant PMH of PAF, IBS, diverticulosis, DVT on Eliquis, HTN, IDA, HLD, OSA, anxiety and depression who presented to the ED with chief complaints of severe headache. Admitted with traumatic subdural hematoma s/p craniotomy for evacuation on 06/25/2023 and now s/p G-tube placement on 07/05/23.    PT Comments  Upon entering room, she voices that she wants to get up today.  She transitions to sitting with max a x 1.  Continues to need +! Close support in sitting due to LOB's but is able to stand pivot to recliner at bedside with max a x 1.  RLE buckling noted but generally easy max transfer to chair.  She remained in chair with daughter in room and needs met.  RN in to disconnect tube feeding for transfer.  Pt has not been OOB since admission so session is limited to allow for time OOB today.  Daughter to let nursing staff know when she is fatigued and communicated with tech need for +2 for transfers back to bed for safety.  Pt  tolerated 30 minutes in chair before leg discomfort and pt scooting down in chair and returned to bed.   If plan is discharge home, recommend the following: Two people to help with walking and/or transfers;Two people to help with bathing/dressing/bathroom;Assistance with feeding;Direct supervision/assist for medications management;Assistance with cooking/housework;Direct supervision/assist for financial management   Can travel by private vehicle        Equipment Recommendations       Recommendations for Other Services       Precautions / Restrictions Precautions Precautions: Fall Recall of Precautions/Restrictions: Impaired Restrictions Weight Bearing Restrictions Per Provider Order: No Other Position/Activity Restrictions: LLQ G-tube placed 07/05/23     Mobility  Bed Mobility Overal  bed mobility: Needs Assistance Bed Mobility: Supine to Sit     Supine to sit: Mod assist       Patient Response: Cooperative  Transfers Overall transfer level: Needs assistance Equipment used: None Transfers: Bed to chair/wheelchair/BSC Sit to Stand: Max assist Stand pivot transfers: Max assist         General transfer comment: able to transfer to chair today, R LE buckling    Ambulation/Gait               General Gait Details: unable/unsafe to attempt   Stairs             Wheelchair Mobility     Tilt Bed Tilt Bed Patient Response: Cooperative  Modified Rankin (Stroke Patients Only)       Balance Overall balance assessment: Needs assistance Sitting-balance support: Feet supported, Single extremity supported Sitting balance-Leahy Scale: Poor Sitting balance - Comments: +1 assist to remain sitting Postural control: Right lateral lean, Posterior lean   Standing balance-Leahy Scale: Zero Standing balance comment: dependant to remain upright                            Communication    Cognition Arousal: Alert Behavior During Therapy: [facility] for tasks assessed/performed                                    Cueing    Exercises      General Comments  Pertinent Vitals/Pain Pain Assessment Pain Assessment: No/denies pain Pain Location: post R leg - seems generally comfortable today Pain Descriptors / Indicators: Sore Pain Intervention(s): Monitored during session, Repositioned, Limited activity within patient's tolerance    Home Living                          Prior Function            PT Goals (current goals can now be found in the care plan section) Progress towards PT goals: Progressing toward goals    Frequency    Min 1X/week      PT Plan      Co-evaluation              AM-PAC PT "6 Clicks" Mobility   Outcome Measure  Help needed turning from your back to your side while  in a flat bed without using bedrails?: A Lot Help needed moving from lying on your back to sitting on the side of a flat bed without using bedrails?: Total Help needed moving to and from a bed to a chair (including a wheelchair)?: Total Help needed standing up from a chair using your arms (e.g., wheelchair or bedside chair)?: Total Help needed to walk in hospital room?: Total Help needed climbing 3-5 steps with a railing? : Total 6 Click Score: 7    End of Session Equipment Utilized During Treatment: Gait belt Activity Tolerance: Patient tolerated treatment well Patient left: in chair;with call bell/phone within reach;with chair alarm set;with family/visitor present Nurse Communication: Mobility status PT Visit Diagnosis: Muscle weakness (generalized) (M62.81);Other abnormalities of gait and mobility (R26.89);Hemiplegia and hemiparesis Hemiplegia - Right/Left: Right Hemiplegia - dominant/non-dominant: Dominant Hemiplegia - caused by: Other cerebrovascular disease     Time: 1105-1119 PT Time Calculation (min) (ACUTE ONLY): 14 min  Charges:    $Therapeutic Activity: 8-22 mins PT General Charges $$ ACUTE PT VISIT: 1 Visit                   Danielle Dess, PTA 07/09/23, 11:31 AM

## 2023-07-09 NOTE — Progress Notes (Addendum)
 Nutrition Follow-up  DOCUMENTATION CODES:   Not applicable  INTERVENTION:   -Continue dysphagia 2 diet  -Continue TF via PEG:    Osmolite 1.2 @ 60 ml/hr    50 ml free water flush every 4 hours   Tube feeding regimen provides 1728 kcal (100% of needs), 80 grams of protein, and 1181 ml of H2O.  Total free water: 1181 ml daily  NUTRITION DIAGNOSIS:   Inadequate oral intake related to lethargy/confusion as evidenced by NPO status.  Ongoing  GOAL:   Patient will meet greater than or equal to 90% of their needs  Met with TF  MONITOR:   Diet advancement, Labs, Weight trends, TF tolerance, Skin, I & O's  REASON FOR ASSESSMENT:   Consult Enteral/tube feeding initiation and management  ASSESSMENT:   73 y/o female with h/o HTN, esophageal stricture, PAF, IDA, diverticulitis, COPD, s/p cardiac catheterization, s/p laparoscopic hiatal hernia repair and Nissen fundoplication 2007, anxiety, depression, SVT, HLD, GERD, DVT, IBS-C and OSA who is admitted with traumatic subdural hematoma with left-to-right midline shift s/p left craniotomy for evacuation of hematoma 2/14 complicated by seizures and AMS.  2/14- s/p left-sided craniotomy for hematoma evacuation  2/18- NGT TF 2/20- s/p BSE- thin liquids 2/21- s/p BSE- ice chips after oral care 2/22- s/p BSE- NPO 2/24- s/p PEG 2/25- TF resumed 2/27- s/p BSE- advanced to trials of dysphagia 2 diet with thin liquids  Reviewed I/O's: +2.2 L x 24 hours and +13.4 L since admission   Spoke with pt at bedside, who is much more alert and interactive in comparison to previous visits. Pt still with word finding difficulties, but able to have a short conversation. Pt reports she slept well last night. She recalls being able to eat a few bites of chickens and mashed potatoes. RD discussed that she is glad pt is eating , but importance of continuing TF until she is able to eat adequately. RD will continue with current TF regimen due to minimal  intake.   Discussed plan with neurology, who agrees. Plan also discussed with RN and MD.   [place]ce stable since last visit.   Per TOC notes, plan for SNF placement once medically stable.   Medications reviewed and include vitamin D3, celexa, pepcid, keppra,and miralax.   Labs reviewed: Na: 131, CBGS: 102-128 (inpatient orders for glycemic control are none).    Diet Order:   Diet Order             DIET DYS 2 Room service appropriate? Yes; Fluid consistency: Thin  Diet effective now                   EDUCATION NEEDS:   No education needs have been identified at this time  Skin:  Skin Assessment: Skin Integrity Issues: Skin Integrity Issues:: Incisions Incisions: closed lt head  Last BM:  07/09/23 (type 7)  Height:   Ht Readings from Last 1 Encounters:  07/09/23 5' 0.98" (1.549 m)    Weight:   Wt Readings from Last 1 Encounters:  07/09/23 60.2 kg    Ideal Body Weight:  47.7 kg  BMI:  Body mass index is 25.09 kg/m.  Estimated Nutritional Needs:   Kcal:  1500-1700kcal/day  Protein:  75-85g/day  Fluid:  1.3-1.5L/day    Levada Schilling, RD, LDN, CDCES Registered Dietitian III Certified Diabetes Care and Education Specialist If unable to reach this RD, please use "RD Inpatient" group chat on secure chat between hours of 8am-4 pm daily

## 2023-07-09 NOTE — TOC Progression Note (Signed)
 Transition of Care [facility]) - Progression Note    Patient Details  Name: [name] MRN: [id number] Date of Birth: 1950-11-03  Transition of Care [facility]) CM/SW Contact  Erin Sons, [place] Phone Number: 07/09/2023, 1:20 PM  Clinical Narrative:     Pt not medically stable. [facility] cannot accept pt over the weekend. Will plan for Monday. Insurance auth expires after 3/1 so will need to be restarted. TOC will continue to follow.   Expected Discharge Plan: Skilled Nursing Facility Barriers to Discharge: Insurance Authorization  Expected Discharge Plan and Services     Post Acute Care Choice: IP Rehab, Skilled Nursing Facility Living arrangements for the past 2 months: Single Family Home                                       Social Determinants of Health (SDOH) Interventions SDOH Screenings   Food Insecurity: No Food Insecurity (03/29/2023)   Received from [facility]  Housing: Low Risk  (05/26/2023)   Received from [facility]  Transportation Needs: No Transportation Needs (03/29/2023)   Received from [facility]  Utilities: Not At Risk (03/29/2023)   Received from [facility]  Financial Resource Strain: Low Risk  (03/29/2023)   Received from [facility]  Tobacco Use: Low Risk  (06/25/2023)    Readmission Risk Interventions     No data to display

## 2023-07-09 NOTE — Progress Notes (Signed)
 [facility]  - [place] at [facility]   PATIENT NAME: [name]    MR#:  [id number]  DATE OF BIRTH:  [birth year]  SUBJECTIVE:  Resting in bed, much more interactive today, denies abd pain or dysuria  VITALS:  Blood pressure 133/63, pulse 81, temperature 98.4 F (36.9 C), temperature source Oral, resp. rate 15, height 5' 0.98" (1.549 m), weight 60.2 kg, SpO2 99%.  PHYSICAL EXAMINATION:   GENERAL:  73 y.o.-year-old patient with no acute distress. LUNGS: Normal breath sounds bilaterally CARDIOVASCULAR: S1, S2 normal. No murmur   ABDOMEN: Soft, nontender, nondistended. PEG+ dressing c/d/i EXTREMITIES: No  edema b/l.    NEUROLOGIC: moving all 4   LABORATORY PANEL:  CBC Recent Labs  Lab 07/09/23 0910  WBC 11.4*  HGB 14.2  HCT 42.0  PLT 534*    Chemistries  Recent Labs  Lab 07/03/23 0443 07/07/23 1358  NA 135 131*  K 4.1 4.6  CL 102 98  CO2 25 23  GLUCOSE 103* 136*  BUN 17 24*  CREATININE 0.68 0.74  CALCIUM 9.1 9.2  MG 2.0  --   AST  --  50*  ALT  --  57*  ALKPHOS  --  193*  BILITOT  --  0.5    Assessment and Plan 73 y.o female with significant PMH of PAF, IBS, diverticulosis, DVT on Eliquis, HTN, IDA, HLD, OSA, anxiety and depression who presented to the ED with chief complaints of severe headache.  Per ED reports, patient apparently fell hitting the back of her head and may have lost consciousness per her brother.     Procedures:  2/14:Left-sided emergent craniotomy for hematoma evacuation   Traumatic subdural hematoma with left-to-right midline shift --S/p left craniotomy done on 2/14 --Drain removed on 2/16 - Continue keppra - ok to resume home Eliquis on POD 14 (2/28). OK to give DVT prophylaxis - repeat head CT around 4 weeks post-op. Pt may need an MMA embo in the future as per NeuroSx --2/25--Repeat CT head stable  Acute cva Seen on CT confirmed with mri - neurology to see - on heparin currently, if tolerates that for 48  hours will plan on starting apixaban after - continue atorva at 20, ldl is less than 70 - tte pending    UTI --2/21 UA positive --s/p ceftriaxone 1 g IV daily x 5 days -- urine culture 80K GNR--E coli and citrobacter--pan sensitive --repeat culture on 2/25 after one fever growing enterococcus but as no additional fevers and no symptoms will hold on additional treatment  Fever Diarrhea New 2/25 night. Covid/flu/rsv neg. No cough. Diarrhea but is on tube feeds, gi pathogen panel neg. Enterococcus growing on urine culture but as no further symptoms have held on tx - monitor   Oral thrush 2/21 Started IV diflucan, switch to oral via tube after PEG tube insertion    Altered Mental Status Seizure Activity likely in the setting of SDH -Repeat imaging as above stable -Seizure prophylaxis with Keppra -Lorazepam/Versed PRN for breakthrough seizure --Patient was seen by neurology.   Acute Blood Loss Anemia due to SDH Hx of IDA --Hgb 6.1 on presentation s/p 2units PRBCs --hgb 12.6  Paroxysmal AF rate controlled HTN HLD Hx of LVH -Hold diltiazem, spironolactone, Imdur for now  --S/p Nicardipine gtt --Hold Eliquis, resume on POD 14 (2/28) --Continue Atorvastatin once able to take po -- amlodipine, metoprolol, PRN labetalol   Anxiety and Depression --continue home Citalopram  and Lorazepam    Hypomagnesemia, mag repleted.  Vitamin D deficiency: Started vitamin D oral supplement. -- follow with PCP    Dysphagia secondary to subdural hemorrhage --Continue NG feeding SLP and nutritionist following 2/21 discussed with patient's family, agreed for PEG tube placement Secure chat/text sent to IR to keep her on the schedule for PEG tube on Monday --2/22--per RN--Dobhoff appears to have clogged. Tried coke flush also. Will hold feeding for now and get PEG placement on Monday --2/23-- started on dextrose to keep sugars stable. Tube feeding on hold. Discussed with RN to give meds which  are crushable through NG --2/24-- patient getting IV dextrose. Tube feeding on hold due to clogging of NG tube. IR plans to place peg tube tomorrow. --2/25-- peg feeding started. Tolerating it well. IV medications change to via peg tube - 2/27 slp has advanced diet but oral intake remains poor, continuing tube feeds  Right shoulder stiffness/pain Rotator cuff tear Mri shows supraspinatous tendon tear, biceps tendon tear, degenerative changes - outpt ortho f/u       Diet: PEG tube, dysphagia 2 DVT Prophylaxis: heparin Advance goals of care discussion: Full code Family communication :dter at bedside 2/28 Consults : neurosurgery, IR, neurology  Level of care: Telemetry Medical Status is: Inpatient Remains inpatient appropriate because: has snf bed, should be able to go when off iv heparin  Silvano Bilis M.D    [facility]

## 2023-07-09 NOTE — Progress Notes (Signed)
 PHARMACY - ANTICOAGULATION CONSULT NOTE  Pharmacy Consult for Heparin Infusion Indication: stroke; low goal, no bolus  Allergies  Allergen Reactions   Dicyclomine Other (See Comments)    REACTION: Choking   Lisinopril Other (See Comments)    REACTION: Choking   Losartan Shortness Of Breath   Ace Inhibitors Other (See Comments)    Unknown to patient   Penicillins Itching and Rash    Patient Measurements: Height: 5' 0.98" (154.9 cm) Weight: 60.2 kg (132 lb 11.5 oz) IBW/kg (Calculated) : 47.76 Heparin Dosing Weight: 59.8 kg  Vital Signs: Temp: 99.7 F (37.6 C) (02/28 1723) Temp Source: Oral (02/28 1723) BP: 126/58 (02/28 1723) Pulse Rate: 86 (02/28 1723)  Labs: Recent Labs    07/07/23 1358 07/09/23 0910 07/09/23 1731  HGB 13.1 14.2  --   HCT 38.0 42.0  --   PLT 394 534*  --   APTT  --  33  --   LABPROT  --  13.2  --   INR  --  1.0  --   HEPARINUNFRC  --   --  <0.10*  CREATININE 0.74  --   --     Estimated Creatinine Clearance: 53 mL/min (by C-G formula based on SCr of 0.74 mg/dL).   Medical History: Past Medical History:  Diagnosis Date   Bradycardia    Chest pain    Constipation    Depression    Diverticulitis    Diverticulosis of colon    DOE (dyspnea on exertion)    DVT (deep venous thrombosis) (HCC)    Esophageal stricture    Hyperlipemia    Hypertension    IBS (irritable bowel syndrome)    Sleep apnea     Medications:  Patient on apixaban 5 mg bid at home--> patient has been hospitalized since 2/14 and has not received any apixaban while inpatient  Was receiving SQH for DVT ppx; last dose 2/27 @ 2122  Assessment: Patient is a 73 year old female with a past medical history of PAF, IBS, diverticulosis, DVT on Eliquis, HTN, IDA, HLD, OSA, anxiety and depression. She presented to ED with chief complaint of severe headache. Patient noted to have traumatic subdural hematoma with left-to-right midline shift and is s/p craniotomy on 2/14 (drain  removed 2/16). Original plan was to re-start home apixaban POD 14 (2/28); however, patient now with 2 small acute infarcts. Neurology ok with re-starting anticoagulation 2/28 but recommends starting heparin infusion (stroke protocol, low goal, no bolus) x 48 hours prior to transitioning to apixaban. Pharmacy was consulted to initiate patient on heparin infusion.  Baseline INR and aPTT ordered.  No signs/symptoms of bleeding noted in chart. Hgb 13.1 and PLT 394 on 2/26. Will go ahead and order baseline CBC for today as well.   2/28 17:31   HL <0.10  Goal of Therapy:  Heparin level 0.3-0.5 units/ml Monitor platelets by anticoagulation protocol: Yes   Plan:  Will not give bolus given stroke protocol and will aim for lower goal of 0.3-0.5 HL is subtherapeutic Increase heparin infusion rate to 800 units/hr Check heparin level in 8 hours Monitor CBC daily   Clovia Cuff, PharmD, BCPS 07/09/2023 6:57 PM

## 2023-07-09 NOTE — Progress Notes (Signed)
 PHARMACY - ANTICOAGULATION CONSULT NOTE  Pharmacy Consult for Heparin Infusion Indication: stroke; low goal, no bolus  Allergies  Allergen Reactions   Dicyclomine Other (See Comments)    REACTION: Choking   Lisinopril Other (See Comments)    REACTION: Choking   Losartan Shortness Of Breath   Ace Inhibitors Other (See Comments)    Unknown to patient   Penicillins Itching and Rash    Patient Measurements: Height: 5' 0.98" (154.9 cm) Weight: 60.2 kg (132 lb 11.5 oz) IBW/kg (Calculated) : 47.76 Heparin Dosing Weight: 59.8 kg  Vital Signs: Temp: 98.4 F (36.9 C) (02/28 0804) Temp Source: Oral (02/28 0804) BP: 133/63 (02/28 0804) Pulse Rate: 81 (02/28 0804)  Labs: Recent Labs    07/07/23 1358  HGB 13.1  HCT 38.0  PLT 394  CREATININE 0.74    Estimated Creatinine Clearance: 53 mL/min (by C-G formula based on SCr of 0.74 mg/dL).   Medical History: Past Medical History:  Diagnosis Date   Bradycardia    Chest pain    Constipation    Depression    Diverticulitis    Diverticulosis of colon    DOE (dyspnea on exertion)    DVT (deep venous thrombosis) (HCC)    Esophageal stricture    Hyperlipemia    Hypertension    IBS (irritable bowel syndrome)    Sleep apnea     Medications:  Patient on apixaban 5 mg bid at home--> patient has been hospitalized since 2/14 and has not received any apixaban while inpatient  Was receiving SQH for DVT ppx; last dose 2/27 @ 2122  Assessment: Patient is a 73 year old female with a past medical history of PAF, IBS, diverticulosis, DVT on Eliquis, HTN, IDA, HLD, OSA, anxiety and depression. She presented to ED with chief complaint of severe headache. Patient noted to have traumatic subdural hematoma with left-to-right midline shift and is s/p craniotomy on 2/14 (drain removed 2/16). Original plan was to re-start home apixaban POD 14 (2/28); however, patient now with 2 small acute infarcts. Neurology ok with re-starting anticoagulation  2/28 but recommends starting heparin infusion (stroke protocol, low goal, no bolus) x 48 hours prior to transitioning to apixaban. Pharmacy was consulted to initiate patient on heparin infusion.  Baseline INR and aPTT ordered.  No signs/symptoms of bleeding noted in chart. Hgb 13.1 and PLT 394 on 2/26. Will go ahead and order baseline CBC for today as well.   Goal of Therapy:  Heparin level 0.3-0.5 units/ml Monitor platelets by anticoagulation protocol: Yes   Plan:  Will not give bolus given stroke protocol and will aim for lower goal of 0.3-0.5 Start heparin infusion at a rate of 700 units/hr Check heparin level in 8 hours Monitor CBC daily   Merryl Hacker, PharmD Clinical Pharmacist  07/09/2023,8:52 AM

## 2023-07-09 NOTE — Progress Notes (Signed)
*  PRELIMINARY RESULTS* Echocardiogram 2D Echocardiogram has been performed.  [name] 07/09/2023, 1:50 PM

## 2023-07-10 DIAGNOSIS — S065XAA Traumatic subdural hemorrhage with loss of consciousness status unknown, initial encounter: Secondary | ICD-10-CM | POA: Diagnosis not present

## 2023-07-10 LAB — BASIC METABOLIC PANEL
Anion gap: 11 (ref 5–15)
BUN: 21 mg/dL (ref 8–23)
CO2: 23 mmol/L (ref 22–32)
Calcium: 9.9 mg/dL (ref 8.9–10.3)
Chloride: 102 mmol/L (ref 98–111)
Creatinine, Ser: 0.68 mg/dL (ref 0.44–1.00)
GFR, Estimated: >60 mL/min (ref >60)
Glucose, Bld: 115 mg/dL — ABNORMAL HIGH (ref 70–99)
Potassium: 4.3 mmol/L (ref 3.5–5.1)
Sodium: 136 mmol/L (ref 135–145)

## 2023-07-10 LAB — CBC
HCT: 39.2 % (ref 36.0–46.0)
Hemoglobin: 13.1 g/dL (ref 12.0–15.0)
MCH: 30.7 pg (ref 26.0–34.0)
MCHC: 33.4 g/dL (ref 30.0–36.0)
MCV: 91.8 fL (ref 80.0–100.0)
Platelets: 475 10*3/uL — ABNORMAL HIGH (ref 150–400)
RBC: 4.27 MIL/uL (ref 3.87–5.11)
RDW: 13.9 % (ref 11.5–15.5)
WBC: 11 10*3/uL — ABNORMAL HIGH (ref 4.0–10.5)
nRBC: 0 % (ref 0.0–0.2)

## 2023-07-10 LAB — URINALYSIS, W/ REFLEX TO CULTURE (INFECTION SUSPECTED)
Bilirubin Urine: NEGATIVE
Glucose, UA: NEGATIVE mg/dL
Hgb urine dipstick: NEGATIVE
Ketones, ur: NEGATIVE mg/dL
Nitrite: NEGATIVE
Protein, ur: NEGATIVE mg/dL
Specific Gravity, Urine: 1.014 (ref 1.005–1.030)
WBC, UA: >50 WBC/hpf (ref 0–5)
pH: 7 (ref 5.0–8.0)

## 2023-07-10 LAB — ECHOCARDIOGRAM COMPLETE BUBBLE STUDY
AR max vel: 3.92 cm2
AV Area VTI: 4.31 cm2
AV Area mean vel: 3.89 cm2
AV Mean grad: 3.3 mmHg
AV Peak grad: 5.8 mmHg
Ao pk vel: 1.2 m/s
S' Lateral: 2.3 cm

## 2023-07-10 LAB — HEPARIN LEVEL (UNFRACTIONATED)
Heparin Unfractionated: 0.12 [IU]/mL — ABNORMAL LOW (ref 0.30–0.70)
Heparin Unfractionated: 0.24 [IU]/mL — ABNORMAL LOW (ref 0.30–0.70)

## 2023-07-10 LAB — GLUCOSE, CAPILLARY
Glucose-Capillary: 108 mg/dL — ABNORMAL HIGH (ref 70–99)
Glucose-Capillary: 109 mg/dL — ABNORMAL HIGH (ref 70–99)
Glucose-Capillary: 87 mg/dL (ref 70–99)

## 2023-07-10 NOTE — Plan of Care (Signed)
  Problem: Education: Goal: Knowledge of General Education information will improve Description: Including pain rating scale, medication(s)/side effects and non-pharmacologic comfort measures 07/10/2023 0452 by Alyce Pagan, RN Outcome: Progressing 07/10/2023 0451 by Alyce Pagan, RN Outcome: Progressing   Problem: Health Behavior/Discharge Planning: Goal: Ability to manage health-related needs will improve 07/10/2023 0452 by Alyce Pagan, RN Outcome: Progressing 07/10/2023 0451 by Alyce Pagan, RN Outcome: Progressing   Problem: Clinical Measurements: Goal: Ability to maintain clinical measurements within normal limits will improve 07/10/2023 0452 by Alyce Pagan, RN Outcome: Progressing 07/10/2023 0451 by Alyce Pagan, RN Outcome: Progressing Goal: Will remain free from infection 07/10/2023 0452 by Alyce Pagan, RN Outcome: Progressing 07/10/2023 0451 by Alyce Pagan, RN Outcome: Progressing Goal: Respiratory complications will improve 07/10/2023 0452 by Alyce Pagan, RN Outcome: Progressing 07/10/2023 0451 by Alyce Pagan, RN Outcome: Progressing Goal: Cardiovascular complication will be avoided 07/10/2023 1610 by Alyce Pagan, RN Outcome: Progressing 07/10/2023 0451 by Alyce Pagan, RN Outcome: Progressing   Problem: Activity: Goal: Risk for activity intolerance will decrease 07/10/2023 0452 by Alyce Pagan, RN Outcome: Progressing 07/10/2023 0451 by Alyce Pagan, RN Outcome: Progressing   Problem: Nutrition: Goal: Adequate nutrition will be maintained 07/10/2023 0452 by Alyce Pagan, RN Outcome: Progressing 07/10/2023 0451 by Alyce Pagan, RN Outcome: Progressing   Problem: Coping: Goal: Level of anxiety will decrease 07/10/2023 0452 by Alyce Pagan, RN Outcome: Progressing 07/10/2023 0451 by Alyce Pagan, RN Outcome: Progressing   Problem: Elimination: Goal: Will not experience complications related to bowel motility 07/10/2023 0452 by  Alyce Pagan, RN Outcome: Progressing 07/10/2023 0451 by Alyce Pagan, RN Outcome: Progressing Goal: Will not experience complications related to urinary retention 07/10/2023 0452 by Alyce Pagan, RN Outcome: Progressing 07/10/2023 0451 by Alyce Pagan, RN Outcome: Progressing   Problem: Pain Managment: Goal: General experience of comfort will improve and/or be controlled 07/10/2023 0452 by Alyce Pagan, RN Outcome: Progressing 07/10/2023 0451 by Alyce Pagan, RN Outcome: Progressing   Problem: Safety: Goal: Ability to remain free from injury will improve 07/10/2023 0452 by Alyce Pagan, RN Outcome: Progressing 07/10/2023 0451 by Alyce Pagan, RN Outcome: Progressing   Problem: Skin Integrity: Goal: Risk for impaired skin integrity will decrease 07/10/2023 0452 by Alyce Pagan, RN Outcome: Progressing 07/10/2023 0451 by Alyce Pagan, RN Outcome: Progressing

## 2023-07-10 NOTE — Progress Notes (Signed)
 PHARMACY - ANTICOAGULATION CONSULT NOTE  Pharmacy Consult for Heparin Infusion Indication: stroke; low goal, no bolus  Allergies  Allergen Reactions   Dicyclomine Other (See Comments)    REACTION: Choking   Lisinopril Other (See Comments)    REACTION: Choking   Losartan Shortness Of Breath   Ace Inhibitors Other (See Comments)    Unknown to patient   Penicillins Itching and Rash    Patient Measurements: Height: 5' 0.98" (154.9 cm) Weight: 60.2 kg (132 lb 11.5 oz) IBW/kg (Calculated) : 47.76 Heparin Dosing Weight: 59.8 kg  Vital Signs: Temp: 97.5 F (36.4 C) (03/01 0340) Temp Source: Axillary (03/01 0340) BP: 136/95 (03/01 0340) Pulse Rate: 77 (03/01 0340)  Labs: Recent Labs    07/07/23 1358 07/09/23 0910 07/09/23 1731 07/10/23 0330  HGB 13.1 14.2  --  13.1  HCT 38.0 42.0  --  39.2  PLT 394 534*  --  475*  APTT  --  33  --   --   LABPROT  --  13.2  --   --   INR  --  1.0  --   --   HEPARINUNFRC  --   --  <0.10* 0.12*  CREATININE 0.74  --   --  0.68    Estimated Creatinine Clearance: 53 mL/min (by C-G formula based on SCr of 0.68 mg/dL).   Medical History: Past Medical History:  Diagnosis Date   Bradycardia    Chest pain    Constipation    Depression    Diverticulitis    Diverticulosis of colon    DOE (dyspnea on exertion)    DVT (deep venous thrombosis) (HCC)    Esophageal stricture    Hyperlipemia    Hypertension    IBS (irritable bowel syndrome)    Sleep apnea     Medications:  Patient on apixaban 5 mg bid at home--> patient has been hospitalized since 2/14 and has not received any apixaban while inpatient  Was receiving SQH for DVT ppx; last dose 2/27 @ 2122  Assessment: Patient is a 73 year old female with a past medical history of PAF, IBS, diverticulosis, DVT on Eliquis, HTN, IDA, HLD, OSA, anxiety and depression. She presented to ED with chief complaint of severe headache. Patient noted to have traumatic subdural hematoma with  left-to-right midline shift and is s/p craniotomy on 2/14 (drain removed 2/16). Original plan was to re-start home apixaban POD 14 (2/28); however, patient now with 2 small acute infarcts. Neurology ok with re-starting anticoagulation 2/28 but recommends starting heparin infusion (stroke protocol, low goal, no bolus) x 48 hours prior to transitioning to apixaban. Pharmacy was consulted to initiate patient on heparin infusion.  Baseline INR and aPTT ordered.  No signs/symptoms of bleeding noted in chart. Hgb 13.1 and PLT 394 on 2/26. Will go ahead and order baseline CBC for today as well.   2/28 17:31   HL <0.10 3/01 03:30   HL   0.12   Goal of Therapy:  Heparin level 0.3-0.5 units/ml Monitor platelets by anticoagulation protocol: Yes   Plan:  Will not give bolus given stroke protocol and will aim for lower goal of 0.3-0.5  3/01:  HL @ 0330 = 0.12, SUBtherapeutic - Will increase drip rate to 900 units/hr and recheck HL in 8 hrs  Monitor CBC daily   [name] D 07/10/2023 5:48 AM

## 2023-07-10 NOTE — Progress Notes (Signed)
 PHARMACY - ANTICOAGULATION CONSULT NOTE  Pharmacy Consult for Heparin Infusion Indication: stroke; low goal, no bolus  Allergies  Allergen Reactions   Dicyclomine Other (See Comments)    REACTION: Choking   Lisinopril Other (See Comments)    REACTION: Choking   Losartan Shortness Of Breath   Ace Inhibitors Other (See Comments)    Unknown to patient   Penicillins Itching and Rash    Patient Measurements: Height: 5' 0.98" (154.9 cm) Weight: 60.9 kg (134 lb 4.2 oz) IBW/kg (Calculated) : 47.76 Heparin Dosing Weight: 59.8 kg  Vital Signs: Temp: 97.6 F (36.4 C) (03/01 1230) Temp Source: Oral (03/01 1230) BP: 127/83 (03/01 1230) Pulse Rate: 81 (03/01 1230)  Labs: Recent Labs    07/09/23 0910 07/09/23 1731 07/10/23 0330 07/10/23 1508  HGB 14.2  --  13.1  --   HCT 42.0  --  39.2  --   PLT 534*  --  475*  --   APTT 33  --   --   --   LABPROT 13.2  --   --   --   INR 1.0  --   --   --   HEPARINUNFRC  --  <0.10* 0.12* 0.24*  CREATININE  --   --  0.68  --     Estimated Creatinine Clearance: 53.2 mL/min (by C-G formula based on SCr of 0.68 mg/dL).   Medical History: Past Medical History:  Diagnosis Date   Bradycardia    Chest pain    Constipation    Depression    Diverticulitis    Diverticulosis of colon    DOE (dyspnea on exertion)    DVT (deep venous thrombosis) (HCC)    Esophageal stricture    Hyperlipemia    Hypertension    IBS (irritable bowel syndrome)    Sleep apnea     Medications:  Patient on apixaban 5 mg bid at home--> patient has been hospitalized since 2/14 and has not received any apixaban while inpatient  Was receiving SQH for DVT ppx; last dose 2/27 @ 2122  Assessment: Patient is a 73 year old female with a past medical history of PAF, IBS, diverticulosis, DVT on Eliquis, HTN, IDA, HLD, OSA, anxiety and depression. She presented to ED with chief complaint of severe headache. Patient noted to have traumatic subdural hematoma with  left-to-right midline shift and is s/p craniotomy on 2/14 (drain removed 2/16). Original plan was to re-start home apixaban POD 14 (2/28); however, patient now with 2 small acute infarcts. Neurology ok with re-starting anticoagulation 2/28 but recommends starting heparin infusion (stroke protocol, low goal, no bolus) x 48 hours prior to transitioning to apixaban. Pharmacy was consulted to initiate patient on heparin infusion.  Baseline INR and aPTT ordered.  No signs/symptoms of bleeding noted in chart. Hgb 13.1 and PLT 394 on 2/26. Will go ahead and order baseline CBC for today as well.   2/28 17:31   HL <0.10 3/01 03:30   HL   0.12  3/01 1508    HL  0.24 - subtherapeutic  Goal of Therapy:  Heparin level 0.3-0.5 units/ml Monitor platelets by anticoagulation protocol: Yes   Plan:  Will not give bolus given stroke protocol and will aim for lower goal of 0.3-0.5 Increase heparin infusion to 1000 units/hour Recheck heparin level in 8 hours Monitor CBC and signs/symptoms of bleeding  Thank you for involving pharmacy in this patient's care.   Rockwell Alexandria, PharmD Clinical Pharmacist 07/10/2023 3:48 PM

## 2023-07-10 NOTE — Progress Notes (Signed)
 [facility]  - [place] at [facility]   PATIENT NAME: [name]    MR#:  [id number]  DATE OF BIRTH:  [birth year]  SUBJECTIVE:  Resting in bed,   interactive today, few bites of lunch  VITALS:  Blood pressure 127/83, pulse 81, temperature 97.6 F (36.4 C), temperature source Oral, resp. rate 18, height 5' 0.98" (1.549 m), weight 60.9 kg, SpO2 100%.  PHYSICAL EXAMINATION:   GENERAL:  73 y.o.-year-old patient with no acute distress. LUNGS: Normal breath sounds bilaterally CARDIOVASCULAR: S1, S2 normal. No murmur   ABDOMEN: Soft, nontender, nondistended. PEG+ dressing c/d/i EXTREMITIES: No  edema b/l.    NEUROLOGIC: moving all 4   LABORATORY PANEL:  CBC Recent Labs  Lab 07/10/23 0330  WBC 11.0*  HGB 13.1  HCT 39.2  PLT 475*    Chemistries  Recent Labs  Lab 07/07/23 1358 07/10/23 0330  NA 131* 136  K 4.6 4.3  CL 98 102  CO2 23 23  GLUCOSE 136* 115*  BUN 24* 21  CREATININE 0.74 0.68  CALCIUM 9.2 9.9  AST 50*  --   ALT 57*  --   ALKPHOS 193*  --   BILITOT 0.5  --     Assessment and Plan 73 y.o female with significant PMH of PAF, IBS, diverticulosis, DVT on Eliquis, HTN, IDA, HLD, OSA, anxiety and depression who presented to the ED with chief complaints of severe headache.  Per ED reports, patient apparently fell hitting the back of her head and may have lost consciousness per her brother.     Procedures:  2/14:Left-sided emergent craniotomy for hematoma evacuation   Traumatic subdural hematoma with left-to-right midline shift --S/p left craniotomy done on 2/14 --Drain removed on 2/16 - Continue keppra - ok to resume home Eliquis on POD 14 (2/28). OK to give DVT prophylaxis - repeat head CT around 4 weeks post-op. Pt may need an MMA embo in the future as per NeuroSx --2/25--Repeat CT head stable  Acute cva Seen on CT confirmed with mri - neurology to see - on heparin currently, if tolerates that for 48 hours will plan on starting  apixaban after - continue atorva at 20, ldl is less than 70 - tte pending    UTI --2/21 UA positive --s/p ceftriaxone 1 g IV daily x 5 days -- urine culture 80K GNR--E coli and citrobacter--pan sensitive --repeat culture on 2/25 after one fever growing enterococcus but as no additional fevers and no symptoms will hold on additional treatment  Fever Diarrhea New 2/25 night. Covid/flu/rsv neg. No cough. Diarrhea but is on tube feeds, gi pathogen panel neg. Enterococcus growing on urine culture but as no further symptoms have held on tx - monitor   Oral thrush 2/21 Started IV diflucan, switch to oral via tube after PEG tube insertion   Altered Mental Status Seizure Activity likely in the setting of SDH -Repeat imaging as above stable -Seizure prophylaxis with Keppra -Lorazepam/Versed PRN for breakthrough seizure --Patient was seen by neurology.   Acute Blood Loss Anemia due to SDH Hx of IDA --Hgb 6.1 on presentation s/p 2units PRBCs --hgb 12.6  Paroxysmal AF rate controlled HTN HLD Hx of LVH -Hold diltiazem, spironolactone, Imdur for now  --S/p Nicardipine gtt --Hold Eliquis, resume on POD 14 (2/28) --Continue Atorvastatin once able to take po -- amlodipine, metoprolol, PRN labetalol   Anxiety and Depression --continue home Citalopram  and Lorazepam    Hypomagnesemia, mag repleted.    Vitamin D deficiency: Started vitamin  D oral supplement. -- follow with PCP    Dysphagia secondary to subdural hemorrhage --Continue NG feeding SLP and nutritionist following 2/21 discussed with patient's family, agreed for PEG tube placement Secure chat/text sent to IR to keep her on the schedule for PEG tube on Monday --2/22--per RN--Dobhoff appears to have clogged. Tried coke flush also. Will hold feeding for now and get PEG placement on Monday --2/23-- started on dextrose to keep sugars stable. Tube feeding on hold. Discussed with RN to give meds which are crushable through  NG --2/24-- patient getting IV dextrose. Tube feeding on hold due to clogging of NG tube. IR plans to place peg tube tomorrow. --2/25-- peg feeding started. Tolerating it well. IV medications change to via peg tube - 2/27 slp has advanced diet but oral intake remains poor, continuing tube feeds  Right shoulder stiffness/pain Rotator cuff tear Mri shows supraspinatous tendon tear, biceps tendon tear, degenerative changes - outpt ortho f/u       Diet: PEG tube, dysphagia 2 DVT Prophylaxis: heparin Advance goals of care discussion: Full code Family communication: daughter updated telephonically 3/1 Consults : neurosurgery, IR, neurology  Level of care: Telemetry Medical Status is: Inpatient Remains inpatient appropriate because: has snf bed, should be able to go when off iv heparin  Silvano Bilis M.D    [facility]

## 2023-07-11 ENCOUNTER — Inpatient Hospital Stay

## 2023-07-11 DIAGNOSIS — S065XAA Traumatic subdural hemorrhage with loss of consciousness status unknown, initial encounter: Secondary | ICD-10-CM | POA: Diagnosis not present

## 2023-07-11 LAB — BASIC METABOLIC PANEL
Anion gap: 8 (ref 5–15)
BUN: 21 mg/dL (ref 8–23)
CO2: 24 mmol/L (ref 22–32)
Calcium: 9.7 mg/dL (ref 8.9–10.3)
Chloride: 104 mmol/L (ref 98–111)
Creatinine, Ser: 0.7 mg/dL (ref 0.44–1.00)
GFR, Estimated: >60 mL/min (ref >60)
Glucose, Bld: 95 mg/dL (ref 70–99)
Potassium: 4.6 mmol/L (ref 3.5–5.1)
Sodium: 136 mmol/L (ref 135–145)

## 2023-07-11 LAB — GLUCOSE, CAPILLARY
Glucose-Capillary: 101 mg/dL — ABNORMAL HIGH (ref 70–99)
Glucose-Capillary: 110 mg/dL — ABNORMAL HIGH (ref 70–99)
Glucose-Capillary: 88 mg/dL (ref 70–99)
Glucose-Capillary: 90 mg/dL (ref 70–99)

## 2023-07-11 LAB — CBC
HCT: 37.2 % (ref 36.0–46.0)
Hemoglobin: 12.7 g/dL (ref 12.0–15.0)
MCH: 31.2 pg (ref 26.0–34.0)
MCHC: 34.1 g/dL (ref 30.0–36.0)
MCV: 91.4 fL (ref 80.0–100.0)
Platelets: 497 10*3/uL — ABNORMAL HIGH (ref 150–400)
RBC: 4.07 MIL/uL (ref 3.87–5.11)
RDW: 13.8 % (ref 11.5–15.5)
WBC: 10.4 10*3/uL (ref 4.0–10.5)
nRBC: 0 % (ref 0.0–0.2)

## 2023-07-11 LAB — HEPARIN LEVEL (UNFRACTIONATED)
Heparin Unfractionated: 0.25 [IU]/mL — ABNORMAL LOW (ref 0.30–0.70)
Heparin Unfractionated: 0.21 [IU]/mL — ABNORMAL LOW (ref 0.30–0.70)

## 2023-07-11 MED ORDER — APIXABAN 5 MG PO TABS
5.0000 mg | ORAL_TABLET | Freq: Two times a day (BID) | ORAL | Status: DC
  Administered 2023-07-12: 5 mg
  Filled 2023-07-11: qty 1

## 2023-07-11 MED ORDER — APIXABAN 5 MG PO TABS
5.0000 mg | ORAL_TABLET | Freq: Two times a day (BID) | ORAL | Status: DC
  Administered 2023-07-11: 5 mg via ORAL
  Filled 2023-07-11: qty 1

## 2023-07-11 MED ORDER — ACETAMINOPHEN 160 MG/5ML PO SOLN
500.0000 mg | ORAL | Status: DC | PRN
  Administered 2023-07-11: 500 mg via ORAL
  Filled 2023-07-11: qty 20.3

## 2023-07-11 NOTE — Plan of Care (Signed)
   Problem: Clinical Measurements: Goal: Ability to maintain clinical measurements within normal limits will improve Outcome: Progressing Goal: Will remain free from infection Outcome: Progressing Goal: Respiratory complications will improve Outcome: Progressing Goal: Cardiovascular complication will be avoided Outcome: Progressing

## 2023-07-11 NOTE — Progress Notes (Addendum)
 [facility]  - [place] at [facility]   PATIENT NAME: [name]    MR#:  [id number]  DATE OF BIRTH:  25-Feb-1951  SUBJECTIVE:  Resting in bed,  ate half of lunch, no pain, thinks right am strength improving  VITALS:  Blood pressure (!) 125/56, pulse 84, temperature 98 F (36.7 C), temperature source Oral, resp. rate 15, height 5' 0.98" (1.549 m), weight 60.9 kg, SpO2 97%.  PHYSICAL EXAMINATION:   GENERAL:  73 y.o.-year-old patient with no acute distress. LUNGS: Normal breath sounds bilaterally CARDIOVASCULAR: S1, S2 normal. No murmur   ABDOMEN: Soft, nontender, nondistended. PEG+ dressing c/d/i EXTREMITIES: No  edema b/l.    NEUROLOGIC: moving all 4, right arm weakness   LABORATORY PANEL:  CBC Recent Labs  Lab 07/10/23 2357  WBC 10.4  HGB 12.7  HCT 37.2  PLT 497*    Chemistries  Recent Labs  Lab 07/07/23 1358 07/10/23 0330 07/11/23 0316  NA 131*   < > 136  K 4.6   < > 4.6  CL 98   < > 104  CO2 23   < > 24  GLUCOSE 136*   < > 95  BUN 24*   < > 21  CREATININE 0.74   < > 0.70  CALCIUM 9.2   < > 9.7  AST 50*  --   --   ALT 57*  --   --   ALKPHOS 193*  --   --   BILITOT 0.5  --   --    < > = values in this interval not displayed.    Assessment and Plan 73 y.o female with significant PMH of PAF, IBS, diverticulosis, DVT on Eliquis, HTN, IDA, HLD, OSA, anxiety and depression who presented to the ED with chief complaints of severe headache.  Per ED reports, patient apparently fell hitting the back of her head and may have lost consciousness per her brother.     Procedures:  2/14:Left-sided emergent craniotomy for hematoma evacuation   Traumatic subdural hematoma with left-to-right midline shift --S/p left craniotomy done on 2/14 --Drain removed on 2/16 - Continue keppra - ok to resume home Eliquis on POD 14 (2/28). OK to give DVT prophylaxis - repeat head CT around 4 weeks post-op. Pt may need an MMA embo in the future as per  NeuroSx --2/25--Repeat CT head stable  Acute cva Seen on CT confirmed with mri - neurology to see - resuming apixaban today - continue atorva at 20, ldl is less than 70 - tte unremarkable - carotid dopplers obtained today    UTI --2/21 UA positive --s/p ceftriaxone 1 g IV daily x 5 days -- urine culture 80K GNR--E coli and citrobacter--pan sensitive --repeat culture on 2/25 after one fever growing enterococcus but as no additional fevers and no symptoms will hold on additional treatment  Fever Diarrhea New 2/25 night. Covid/flu/rsv neg. No cough. Diarrhea but is on tube feeds, gi pathogen panel neg. Enterococcus growing on urine culture but as no further symptoms have held on tx - monitor   Oral thrush 2/21 Started IV diflucan, switch to oral via tube after PEG tube insertion   Altered Mental Status Seizure Activity likely in the setting of SDH -Repeat imaging as above stable -Seizure prophylaxis with Keppra -Lorazepam/Versed PRN for breakthrough seizure --Patient was seen by neurology.   Acute Blood Loss Anemia due to SDH Hx of IDA --Hgb 6.1 on presentation s/p 2units PRBCs --hgb 12.6  Paroxysmal AF rate controlled HTN  HLD Hx of LVH -Hold diltiazem, spironolactone, Imdur for now  --S/p Nicardipine gtt --resume apixaban today --Continue Atorvastatin once able to take po -- amlodipine, metoprolol, PRN labetalol   Anxiety and Depression --continue home Citalopram  and Lorazepam    Hypomagnesemia, mag repleted.    Vitamin D deficiency: Started vitamin D oral supplement. -- follow with PCP    Dysphagia secondary to subdural hemorrhage --Continue NG feeding SLP and nutritionist following 2/21 discussed with patient's family, agreed for PEG tube placement Secure chat/text sent to IR to keep her on the schedule for PEG tube on Monday --2/22--per RN--Dobhoff appears to have clogged. Tried coke flush also. Will hold feeding for now and get PEG placement on  Monday --2/23-- started on dextrose to keep sugars stable. Tube feeding on hold. Discussed with RN to give meds which are crushable through NG --2/24-- patient getting IV dextrose. Tube feeding on hold due to clogging of NG tube. IR plans to place peg tube tomorrow. --2/25-- peg feeding started. Tolerating it well. IV medications change to via peg tube - 2/27 slp has advanced diet but oral intake remains poor, continuing tube feeds but bolusing now to see if po picks up  Right shoulder stiffness/pain Rotator cuff tear Mri shows supraspinatous tendon tear, biceps tendon tear, degenerative changes - outpt ortho f/u       Diet: PEG tube, dysphagia 2 DVT Prophylaxis: heparin Advance goals of care discussion: Full code Family communication: daughter updated at bedside 3/2 Consults : neurosurgery, IR, neurology  Level of care: Telemetry Medical Status is: Inpatient Remains inpatient appropriate because: pending cir eval, if not accepted to snf possibly tomorrow  Silvano Bilis M.D    [facility]

## 2023-07-11 NOTE — Progress Notes (Signed)
 Speech Language Pathology Treatment: Cognitive-Linquistic  Patient Details Name: [name] MRN: [id number] DOB: [birth year] Today's Date: 07/11/2023 Time: 0940-1010 SLP Time Calculation (min) (ACUTE ONLY): 30 min  Assessment / Plan / Recommendation Clinical Impression  Pt seen for language treatment. Pt with greatly improved alertness and interaction. Pt spoke at the sentence length multiple times including stating "I am fighting, Give me a napkin, That is my oldest brother." SLP facilitated the session by providing rote tasks such as counting to 10, saying the DOW, singing ABCs, Happy [place] Ouch and reviewing pt's brothers' names. Pt benefited from imitating SLP model initially but progressed to being able to reproduce counting, DOW (with written list provided), singing Happy [place] Ouch and saying her brother's names. Overall speech intelligibility throughout these activities was > 95%. Task was written down for pt to practice during the day.   In addition to the above structured tasks, pt was able to communicate some basic wants/needs such as when her sister-in-law was saying pt wasn't hungry because of her continuous feedings pt stated "I was full." Pt also stated "chicken pie from [facility]" to which her sister-in-law stated that pt requested this for lunch as pt wasn't pleased with dysphagia 2 textures. Pt also stated, "I love you [employer]" when her family mentioned that this writer alerted MD to potential s/s of UTI last week.   ST to follow for further language assessment and advancement. Will attempt to return for possible diet assessment if able later today.     HPI HPI: 73 y.o female with significant PMH of PAF, IBS, diverticulosis, DVT on Eliquis, HTN, IDA, HLD, OSA, anxiety and depression who presented to the ED with chief complaints of severe headache. Per ED reports, patient apparently fell on Monday hitting the back of her head and may have lost consciousness per her brother. Patient complained  of posterior headaches for about 2 to 3 days but refused to seek care. She went out to lunch today with friends and began complaining of severe headache. EMS was called and patient transported to the ED. Admitted 2/14 with traumatic left subdural hematoma s/p emergent crani for hematoma evacuation. Extubated and transferred to the ICU. GI History: Esophageal stricture, per chart review last EGD with dilation completed on 01/25/23. 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 Subdural hematomas overlying the bilateral cerebral hemispheres have not significantly changed in extent. However, new from the prior brain MRI of 06/27/2023, there is apparent restricted diffusion within these collections. This may simply reflect susceptibility artifact from blood products. However, restricted diffusion can also be seen in subdural empyemas and empyemas cannot be excluded by imaging. 4 mm rightward midline shift, unchanged. 07/07/23: Minimal left lung base atelectasis. s/p PEG placement      SLP Plan  Continue with current plan of care      Recommendations for follow up therapy are one component of a multi-disciplinary discharge planning process, led by the attending physician.  Recommendations may be updated based on patient status, additional functional criteria and insurance authorization.    Recommendations  Diet recommendations: Dysphagia 2 (fine chop);Thin liquid (and family can bring in outside food) Liquids provided via: Cup;Straw Medication Administration: Whole meds with liquid Supervision: Staff to assist with self feeding;Full supervision/cueing for compensatory strategies Compensations: Minimize environmental distractions;Slow rate;Small sips/bites Postural Changes and/or Swallow Maneuvers: Out of bed for meals;Seated upright 90 degrees;Upright 30-60 min after meal  Oral care BID   Frequent or constant  Supervision/Assistance Aphasia (R47.01);Apraxia (R48.2)     Continue with current plan of care    Martyn Timme B. Dreama Saa, M.S., CCC-SLP, Tree surgeon Certified Brain Injury Specialist [facility]  [facility] Rehabilitation Services Office [phone] Ascom [phone] Fax [phone]

## 2023-07-11 NOTE — Progress Notes (Signed)
 PHARMACY - ANTICOAGULATION CONSULT NOTE  Pharmacy Consult for Heparin Infusion Indication: stroke; low goal, no bolus  Allergies  Allergen Reactions   Dicyclomine Other (See Comments)    REACTION: Choking   Lisinopril Other (See Comments)    REACTION: Choking   Losartan Shortness Of Breath   Ace Inhibitors Other (See Comments)    Unknown to patient   Penicillins Itching and Rash    Patient Measurements: Height: 5' 0.98" (154.9 cm) Weight: 60.9 kg (134 lb 4.2 oz) IBW/kg (Calculated) : 47.76 Heparin Dosing Weight: 59.8 kg  Vital Signs: Temp: 98.2 F (36.8 C) (03/02 0830) Temp Source: Oral (03/02 0352) BP: 127/63 (03/02 0830) Pulse Rate: 89 (03/02 0830)  Labs: Recent Labs    07/09/23 0910 07/09/23 1731 07/10/23 0330 07/10/23 1508 07/10/23 2357 07/11/23 0316 07/11/23 0934  HGB 14.2  --  13.1  --  12.7  --   --   HCT 42.0  --  39.2  --  37.2  --   --   PLT 534*  --  475*  --  497*  --   --   APTT 33  --   --   --   --   --   --   LABPROT 13.2  --   --   --   --   --   --   INR 1.0  --   --   --   --   --   --   HEPARINUNFRC  --    < > 0.12* 0.24* 0.25*  --  0.21*  CREATININE  --   --  0.68  --   --  0.70  --    < > = values in this interval not displayed.    Estimated Creatinine Clearance: 53.2 mL/min (by C-G formula based on SCr of 0.7 mg/dL).   Medical History: Past Medical History:  Diagnosis Date   Bradycardia    Chest pain    Constipation    Depression    Diverticulitis    Diverticulosis of colon    DOE (dyspnea on exertion)    DVT (deep venous thrombosis) (HCC)    Esophageal stricture    Hyperlipemia    Hypertension    IBS (irritable bowel syndrome)    Sleep apnea     Medications:  Patient on apixaban 5 mg bid at home--> patient has been hospitalized since 2/14 and has not received any apixaban while inpatient  Was receiving SQH for DVT ppx; last dose 2/27 @ 2122  Assessment: Patient is a 73 year old female with a past medical history of  PAF, IBS, diverticulosis, DVT on Eliquis, HTN, IDA, HLD, OSA, anxiety and depression. She presented to ED with chief complaint of severe headache. Patient noted to have traumatic subdural hematoma with left-to-right midline shift and is s/p craniotomy on 2/14 (drain removed 2/16). Original plan was to re-start home apixaban POD 14 (2/28); however, patient now with 2 small acute infarcts. Neurology ok with re-starting anticoagulation 2/28 but recommends starting heparin infusion (stroke protocol, low goal, no bolus) x 48 hours prior to transitioning to apixaban. Pharmacy was consulted to initiate patient on heparin infusion.  Baseline INR and aPTT ordered.  No signs/symptoms of bleeding noted in chart. Hgb 13.1 and PLT 394 on 2/26. Will go ahead and order baseline CBC for today as well.   2/28 17:31   HL <0.10 3/01 03:30   HL   0.12  3/01 1508    HL  0.24 -  subtherapeutic 3/02 0934    HL 0.21 - subtherapeutic  Goal of Therapy:  Heparin level 0.3-0.5 units/ml Monitor platelets by anticoagulation protocol: Yes   Plan:  Will not give bolus given stroke protocol and will aim for lower goal of 0.3-0.5 Increase heparin infusion to 1150 units/hour Recheck heparin level in 8 hours Monitor CBC and signs/symptoms of bleeding  Thank you for involving pharmacy in this patient's care.   Bari Mantis PharmD Clinical Pharmacist 07/11/2023

## 2023-07-11 NOTE — Progress Notes (Addendum)
 Inpatient Rehab Admissions Coordinator:   Per therapy recommendations,  patient was screened for CIR candidacy by Megan Salon, MS, CCC-SLP. At this time, Pt. Appears to be a a potential candidate for CIR. I will place   order for rehab consult per protocol for full assessment.  Note that prior plan was for d/c to [facility] (though needed insurance re-auth) for Monday. I reached out to [facility], MD and therapy staff and all in agreement to fully assess for CIR candidacy. Pt was also living alone prior to admit, and would likely not d/c home without 24/7 supervision, so family will need to be able to take her home with 24/7 support if we are to consider a CIR admission. Please contact me any with questions.  Megan Salon, MS, CCC-SLP Rehab Admissions Coordinator  [phone] (celll) [phone] (office)

## 2023-07-11 NOTE — Progress Notes (Signed)
 PHARMACY - ANTICOAGULATION CONSULT NOTE  Pharmacy Consult for transition to apixaban Indication: atrial fibrillation  Allergies  Allergen Reactions   Dicyclomine Other (See Comments)    REACTION: Choking   Lisinopril Other (See Comments)    REACTION: Choking   Losartan Shortness Of Breath   Ace Inhibitors Other (See Comments)    Unknown to patient   Penicillins Itching and Rash    Patient Measurements: Height: 5' 0.98" (154.9 cm) Weight: 60.9 kg (134 lb 4.2 oz) IBW/kg (Calculated) : 47.76 Heparin Dosing Weight: 59.8 kg  Vital Signs: Temp: 98 F (36.7 C) (03/02 1210) Temp Source: Oral (03/02 1210) BP: 125/56 (03/02 1210) Pulse Rate: 84 (03/02 1210)  Labs: Recent Labs    07/09/23 0910 07/09/23 1731 07/10/23 0330 07/10/23 1508 07/10/23 2357 07/11/23 0316 07/11/23 0934  HGB 14.2  --  13.1  --  12.7  --   --   HCT 42.0  --  39.2  --  37.2  --   --   PLT 534*  --  475*  --  497*  --   --   APTT 33  --   --   --   --   --   --   LABPROT 13.2  --   --   --   --   --   --   INR 1.0  --   --   --   --   --   --   HEPARINUNFRC  --    < > 0.12* 0.24* 0.25*  --  0.21*  CREATININE  --   --  0.68  --   --  0.70  --    < > = values in this interval not displayed.    Estimated Creatinine Clearance: 53.2 mL/min (by C-G formula based on SCr of 0.7 mg/dL).   Medical History: Past Medical History:  Diagnosis Date   Bradycardia    Chest pain    Constipation    Depression    Diverticulitis    Diverticulosis of colon    DOE (dyspnea on exertion)    DVT (deep venous thrombosis) (HCC)    Esophageal stricture    Hyperlipemia    Hypertension    IBS (irritable bowel syndrome)    Sleep apnea     Medications:  Patient on apixaban 5 mg bid at home--> patient has been hospitalized since 2/14 and has not received any apixaban while inpatient  Was receiving SQH for DVT ppx; last dose 2/27 @ 2122  Assessment: Patient is a 73 year old female with a past medical history of  PAF, IBS, diverticulosis, DVT on Eliquis, HTN, IDA, HLD, OSA, anxiety and depression. She presented to ED with chief complaint of severe headache. Patient noted to have traumatic subdural hematoma with left-to-right midline shift and is s/p craniotomy on 2/14 (drain removed 2/16). Original plan was to re-start home apixaban POD 14 (2/28); however, patient now with 2 small acute infarcts. Neurology ok with re-starting anticoagulation 2/28 but recommends starting heparin infusion (stroke protocol, low goal, no bolus) x 48 hours prior to transitioning to apixaban. Pharmacy was consulted to initiate patient on heparin infusion.  Baseline INR and aPTT ordered.  No signs/symptoms of bleeding noted in chart. Hgb 13.1 and PLT 394 on 2/26. Will go ahead and order baseline CBC for today as well.   2/28 17:31   HL <0.10 3/01 03:30   HL   0.12  3/01 1508    HL  0.24 - subtherapeutic 3/02  0934    HL 0.21 - subtherapeutic  Goal of Therapy:  Heparin level 0.3-0.5 units/ml Monitor platelets by anticoagulation protocol: Yes   Plan:  Per MD, will transition to PTA apixaban this afternoon Stop heparin infusion Start apixaban 5 mg po twice daily Monitor CBC and signs/symptoms of bleeding  Thank you for involving pharmacy in this patient's care.   Rockwell Alexandria, PharmD Clinical Pharmacist 07/11/2023 3:29 PM

## 2023-07-11 NOTE — Progress Notes (Addendum)
 Speech Language Pathology Treatment: Cognitive-Linquistic  Patient Details Name: [name] MRN: [id number] DOB: [birth year] Today's Date: 07/11/2023 Time: 4098-1191 SLP Time Calculation (min) (ACUTE ONLY): 17 min  Assessment / Plan / Recommendation Clinical Impression  Pt seen for ongoing dysphagia management. Pt reports that she would like a chicken pot pie from [facility] for lunch. Skilled observation was provided of pt consuming graham crackers with peanut butter and thin liquids via straw. Pt was improved mastication, complete oral clearing and the appearance of a timely swallow initiation. No overt s/s of aspiration were observed. At baseline, pt consumes POs using small bites and slow rate d/t hx of esophageal dysfunction. At this time, pt is appropriate for diet upgrade to dysphagia 3 diet with outside food allowed. Secure chat sent to pt's attending to possibly stop continuous feeds prior to PO intake to allow pt to orally consume POs with feedings as supplemental.    HPI HPI: 73 y.o female with significant PMH of PAF, IBS, diverticulosis, DVT on Eliquis, HTN, IDA, HLD, OSA, anxiety and depression who presented to the ED with chief complaints of severe headache. Per ED reports, patient apparently fell on Monday hitting the back of her head and may have lost consciousness per her brother. Patient complained of posterior headaches for about 2 to 3 days but refused to seek care. She went out to lunch today with friends and began complaining of severe headache. EMS was called and patient transported to the ED. Admitted 2/14 with traumatic left subdural hematoma s/p emergent crani for hematoma evacuation. Extubated and transferred to the ICU. GI History: Esophageal stricture, per chart review last EGD with dilation completed on 01/25/23. 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 Subdural hematomas overlying the bilateral cerebral  hemispheres have not significantly changed in extent. However, new from the prior brain MRI of 06/27/2023, there is apparent restricted diffusion within these collections. This may simply reflect susceptibility artifact from blood products. However, restricted diffusion can also be seen in subdural empyemas and empyemas cannot be excluded by imaging. 4 mm rightward midline shift, unchanged. 07/07/23: Minimal left lung base atelectasis. s/p PEG placement      SLP Plan  Continue with current plan of care      Recommendations for follow up therapy are one component of a multi-disciplinary discharge planning process, led by the attending physician.  Recommendations may be updated based on patient status, additional functional criteria and insurance authorization.    Recommendations  Diet recommendations: Dysphagia 2 (fine chop);Thin liquid (and family can bring in outside food) Liquids provided via: Cup;Straw Medication Administration: Whole meds with liquid Supervision: Staff to assist with self feeding;Full supervision/cueing for compensatory strategies Compensations: Minimize environmental distractions;Slow rate;Small sips/bites Postural Changes and/or Swallow Maneuvers: Out of bed for meals;Seated upright 90 degrees;Upright 30-60 min after meal                  Oral care BID   Frequent or constant Supervision/Assistance Dysphagia, oropharyngeal phase (R13.12);Apraxia (R48.2);Aphasia (R47.01)     Continue with current plan of care    Shariece Viveiros B. Dreama Saa, M.S., CCC-SLP, Tree surgeon Certified Brain Injury Specialist [facility]  [facility] Rehabilitation Services Office [phone] Ascom [phone] Fax [phone]

## 2023-07-11 NOTE — Plan of Care (Signed)

## 2023-07-11 NOTE — Progress Notes (Signed)
 Physical Therapy Treatment Patient Details Name: [name] MRN: [id number] DOB: 03/12/1951 Today's Date: 07/11/2023   History of Present Illness Pt is a 73 y.o female with significant PMH of PAF, IBS, diverticulosis, DVT on Eliquis, HTN, IDA, HLD, OSA, anxiety and depression who presented to the ED with chief complaints of severe headache. Admitted with traumatic subdural hematoma s/p craniotomy for evacuation on 06/25/2023 and now s/p G-tube placement on 07/05/23, also noted for acute infarcts per imaging.    PT Comments  Pt overall noted to have great progression in activity tolerance, stability, and RUE/RLE strength today. Up in chair at start of session, and in recliner with needs in reach after. Family at bedside supportive and encouraging throughout. She was able to perform several therex of RLE with verbal/tactile cues to ensure technique, AROM. Sit <> stand twice with RLE blocked and use of bed rail on L, minA. Able to stand for several minutes at a time and perform some pregait activities, including progressing to forward taps of both R and LLE. Pt and family pleased with pt performance. The patient would benefit from further skilled PT intervention to continue to progress towards goals, PT informed care team of pt status and progression  and potential benefit of upgrade to >3hrs of therapy discharge recommendation.   If plan is discharge home, recommend the following: Two people to help with walking and/or transfers;Two people to help with bathing/dressing/bathroom;Assistance with feeding;Direct supervision/assist for medications management;Assistance with cooking/housework;Direct supervision/assist for financial management   Can travel by private vehicle     No  Equipment Recommendations  Other (comment) (TBD at next venue of care)    Recommendations for Other Services       Precautions / Restrictions Precautions Precautions: Fall Recall of Precautions/Restrictions:  Impaired Restrictions Weight Bearing Restrictions Per Provider Order: No Other Position/Activity Restrictions: LLQ G-tube placed 07/05/23     Mobility  Bed Mobility               General bed mobility comments: pt up in recliner at start/end of session    Transfers Overall transfer level: Needs assistance Equipment used:  (use of bed rail like handrail on L side)   Sit to Stand: Min assist           General transfer comment: minA to stand from recliner for slight steadying, RLE blocked for safety and RUE positioning per PT to minimize injury    Ambulation/Gait               General Gait Details: able to stand and weight shift with some facilitation, and on second bout tap forwards with both LE, minA   Stairs             Wheelchair Mobility     Tilt Bed    Modified Rankin (Stroke Patients Only)       Balance Overall balance assessment: Needs assistance Sitting-balance support: Feet supported, Single extremity supported Sitting balance-Leahy Scale: Fair       Standing balance-Leahy Scale: Poor Standing balance comment: reliant on bedrail on L side for support but progressing to being able to stand with CGA (R knee blocked for safety). intermittent posterior lean noted                            Communication    Cognition Arousal: Alert Behavior During Therapy: WFL for tasks assessed/performed     Difficult to assess due to: Impaired communication  PT - Cognition Comments: able to stated her name, followed all one step commands (intermittently needed verbal/visual cues to maintain intregrity of technique)        Cueing    Exercises Other Exercises Other Exercises: SAQ x10, LAQx10, SLR x10    General Comments        Pertinent Vitals/Pain Pain Assessment Pain Assessment: No/denies pain Pain Location: pt grunts/groans with exertion but denies pain    Home Living                           Prior Function            PT Goals (current goals can now be found in the care plan section) Progress towards PT goals: Progressing toward goals    Frequency    Min 1X/week      PT Plan      Co-evaluation              AM-PAC PT "6 Clicks" Mobility   Outcome Measure  Help needed turning from your back to your side while in a flat bed without using bedrails?: A Lot Help needed moving from lying on your back to sitting on the side of a flat bed without using bedrails?: A Lot Help needed moving to and from a bed to a chair (including a wheelchair)?: A Lot Help needed standing up from a chair using your arms (e.g., wheelchair or bedside chair)?: A Little Help needed to walk in hospital room?: A Lot Help needed climbing 3-5 steps with a railing? : Total 6 Click Score: 12    End of Session   Activity Tolerance: Patient tolerated treatment well Patient left: in chair;with call bell/phone within reach;with chair alarm set;with family/visitor present Nurse Communication: Mobility status PT Visit Diagnosis: Muscle weakness (generalized) (M62.81);Other abnormalities of gait and mobility (R26.89);Hemiplegia and hemiparesis Hemiplegia - Right/Left: Right Hemiplegia - dominant/non-dominant: Dominant Hemiplegia - caused by: Other cerebrovascular disease     Time: 0908-0928 PT Time Calculation (min) (ACUTE ONLY): 20 min  Charges:    $Therapeutic Exercise: 8-22 mins PT General Charges $$ ACUTE PT VISIT: 1 Visit                     Olga Coaster PT, DPT 9:41 AM,07/11/23

## 2023-07-12 ENCOUNTER — Inpatient Hospital Stay (HOSPITAL_COMMUNITY)
Admission: AD | Admit: 2023-07-12 | Discharge: 2023-07-24 | DRG: 945 | Disposition: A | Discharge status: Home / Self Care | Source: Other Acute Inpatient Hospital | Attending: Physical Medicine & Rehabilitation | Admitting: Physical Medicine & Rehabilitation

## 2023-07-12 ENCOUNTER — Other Ambulatory Visit: Payer: Self-pay

## 2023-07-12 ENCOUNTER — Encounter (HOSPITAL_COMMUNITY): Payer: Self-pay | Admitting: Physical Medicine & Rehabilitation

## 2023-07-12 DIAGNOSIS — Z90710 Acquired absence of both cervix and uterus: Secondary | ICD-10-CM | POA: Diagnosis not present

## 2023-07-12 DIAGNOSIS — G935 Compression of brain: Secondary | ICD-10-CM | POA: Diagnosis present

## 2023-07-12 DIAGNOSIS — N390 Urinary tract infection, site not specified: Secondary | ICD-10-CM | POA: Diagnosis present

## 2023-07-12 DIAGNOSIS — R1314 Dysphagia, pharyngoesophageal phase: Secondary | ICD-10-CM | POA: Diagnosis not present

## 2023-07-12 DIAGNOSIS — E785 Hyperlipidemia, unspecified: Secondary | ICD-10-CM | POA: Diagnosis present

## 2023-07-12 DIAGNOSIS — F32A Depression, unspecified: Secondary | ICD-10-CM | POA: Diagnosis present

## 2023-07-12 DIAGNOSIS — M21372 Foot drop, left foot: Secondary | ICD-10-CM | POA: Diagnosis present

## 2023-07-12 DIAGNOSIS — Z8249 Family history of ischemic heart disease and other diseases of the circulatory system: Secondary | ICD-10-CM | POA: Diagnosis not present

## 2023-07-12 DIAGNOSIS — Z880 Allergy status to penicillin: Secondary | ICD-10-CM | POA: Diagnosis not present

## 2023-07-12 DIAGNOSIS — M75121 Complete rotator cuff tear or rupture of right shoulder, not specified as traumatic: Secondary | ICD-10-CM | POA: Diagnosis present

## 2023-07-12 DIAGNOSIS — Z888 Allergy status to other drugs, medicaments and biological substances status: Secondary | ICD-10-CM | POA: Diagnosis not present

## 2023-07-12 DIAGNOSIS — I480 Paroxysmal atrial fibrillation: Secondary | ICD-10-CM | POA: Diagnosis present

## 2023-07-12 DIAGNOSIS — B370 Candidal stomatitis: Secondary | ICD-10-CM | POA: Diagnosis present

## 2023-07-12 DIAGNOSIS — M21332 Wrist drop, left wrist: Secondary | ICD-10-CM | POA: Diagnosis present

## 2023-07-12 DIAGNOSIS — W1830XD Fall on same level, unspecified, subsequent encounter: Secondary | ICD-10-CM

## 2023-07-12 DIAGNOSIS — Z803 Family history of malignant neoplasm of breast: Secondary | ICD-10-CM | POA: Diagnosis not present

## 2023-07-12 DIAGNOSIS — Z79899 Other long term (current) drug therapy: Secondary | ICD-10-CM | POA: Diagnosis not present

## 2023-07-12 DIAGNOSIS — S065XAD Traumatic subdural hemorrhage with loss of consciousness status unknown, subsequent encounter: Principal | ICD-10-CM

## 2023-07-12 DIAGNOSIS — S065XAA Traumatic subdural hemorrhage with loss of consciousness status unknown, initial encounter: Secondary | ICD-10-CM | POA: Diagnosis not present

## 2023-07-12 DIAGNOSIS — R4701 Aphasia: Secondary | ICD-10-CM | POA: Diagnosis present

## 2023-07-12 DIAGNOSIS — Z7901 Long term (current) use of anticoagulants: Secondary | ICD-10-CM | POA: Diagnosis not present

## 2023-07-12 DIAGNOSIS — N3000 Acute cystitis without hematuria: Secondary | ICD-10-CM | POA: Diagnosis not present

## 2023-07-12 DIAGNOSIS — B952 Enterococcus as the cause of diseases classified elsewhere: Secondary | ICD-10-CM | POA: Diagnosis present

## 2023-07-12 DIAGNOSIS — Z86718 Personal history of other venous thrombosis and embolism: Secondary | ICD-10-CM

## 2023-07-12 DIAGNOSIS — F419 Anxiety disorder, unspecified: Secondary | ICD-10-CM | POA: Diagnosis present

## 2023-07-12 DIAGNOSIS — R1310 Dysphagia, unspecified: Secondary | ICD-10-CM | POA: Diagnosis present

## 2023-07-12 DIAGNOSIS — Z833 Family history of diabetes mellitus: Secondary | ICD-10-CM

## 2023-07-12 DIAGNOSIS — S065X9D Traumatic subdural hemorrhage with loss of consciousness of unspecified duration, subsequent encounter: Secondary | ICD-10-CM

## 2023-07-12 DIAGNOSIS — D62 Acute posthemorrhagic anemia: Secondary | ICD-10-CM

## 2023-07-12 DIAGNOSIS — I10 Essential (primary) hypertension: Secondary | ICD-10-CM

## 2023-07-12 DIAGNOSIS — Z808 Family history of malignant neoplasm of other organs or systems: Secondary | ICD-10-CM

## 2023-07-12 DIAGNOSIS — I639 Cerebral infarction, unspecified: Secondary | ICD-10-CM

## 2023-07-12 DIAGNOSIS — K580 Irritable bowel syndrome with diarrhea: Secondary | ICD-10-CM | POA: Diagnosis present

## 2023-07-12 LAB — URINE CULTURE: Culture: >=100000 — AB

## 2023-07-12 LAB — GLUCOSE, CAPILLARY
Glucose-Capillary: 103 mg/dL — ABNORMAL HIGH (ref 70–99)
Glucose-Capillary: 104 mg/dL — ABNORMAL HIGH (ref 70–99)
Glucose-Capillary: 110 mg/dL — ABNORMAL HIGH (ref 70–99)
Glucose-Capillary: 86 mg/dL (ref 70–99)
Glucose-Capillary: 95 mg/dL (ref 70–99)

## 2023-07-12 LAB — CULTURE, BLOOD (ROUTINE X 2)
Culture: NO GROWTH
Culture: NO GROWTH
Special Requests: ADEQUATE

## 2023-07-12 LAB — CBC
HCT: 38.1 % (ref 36.0–46.0)
Hemoglobin: 12.9 g/dL (ref 12.0–15.0)
MCH: 30.6 pg (ref 26.0–34.0)
MCHC: 33.9 g/dL (ref 30.0–36.0)
MCV: 90.3 fL (ref 80.0–100.0)
Platelets: 545 10*3/uL — ABNORMAL HIGH (ref 150–400)
RBC: 4.22 MIL/uL (ref 3.87–5.11)
RDW: 13.8 % (ref 11.5–15.5)
WBC: 10.7 10*3/uL — ABNORMAL HIGH (ref 4.0–10.5)
nRBC: 0 % (ref 0.0–0.2)

## 2023-07-12 LAB — BASIC METABOLIC PANEL
Anion gap: 11 (ref 5–15)
BUN: 18 mg/dL (ref 8–23)
CO2: 23 mmol/L (ref 22–32)
Calcium: 10.1 mg/dL (ref 8.9–10.3)
Chloride: 102 mmol/L (ref 98–111)
Creatinine, Ser: 0.8 mg/dL (ref 0.44–1.00)
GFR, Estimated: >60 mL/min (ref >60)
Glucose, Bld: 121 mg/dL — ABNORMAL HIGH (ref 70–99)
Potassium: 4 mmol/L (ref 3.5–5.1)
Sodium: 136 mmol/L (ref 135–145)

## 2023-07-12 MED ORDER — ALUM & MAG HYDROXIDE-SIMETH 200-200-20 MG/5ML PO SUSP: 30.0000 mL | ORAL | Status: DC | PRN

## 2023-07-12 MED ORDER — POLYETHYLENE GLYCOL 3350 17 G PO PACK
17.0000 g | PACK | Freq: Once | ORAL | Status: AC
  Administered 2023-07-12: 17 g via ORAL
  Filled 2023-07-12: qty 1

## 2023-07-12 MED ORDER — CITALOPRAM HYDROBROMIDE 20 MG PO TABS: 20.0000 mg | ORAL_TABLET | Freq: Every day | ORAL | Status: DC

## 2023-07-12 MED ORDER — PROCHLORPERAZINE EDISYLATE 10 MG/2ML IJ SOLN: 5.0000 mg | Freq: Four times a day (QID) | INTRAMUSCULAR | Status: DC | PRN

## 2023-07-12 MED ORDER — LORAZEPAM 0.5 MG PO TABS
0.5000 mg | ORAL_TABLET | Freq: Three times a day (TID) | ORAL | Status: DC | PRN
  Administered 2023-07-24: 0.5 mg via ORAL
  Filled 2023-07-12: qty 1

## 2023-07-12 MED ORDER — LEVETIRACETAM 100 MG/ML PO SOLN
500.0000 mg | Freq: Two times a day (BID) | ORAL | Status: DC
Start: 2023-07-12 — End: 2023-07-12
  Filled 2023-07-12 (×2): qty 5

## 2023-07-12 MED ORDER — TRAZODONE HCL 50 MG PO TABS
25.0000 mg | ORAL_TABLET | Freq: Every evening | ORAL | Status: DC | PRN
  Administered 2023-07-12 – 2023-07-23 (×4): 50 mg via ORAL
  Filled 2023-07-12 (×4): qty 1

## 2023-07-12 MED ORDER — ATORVASTATIN CALCIUM 10 MG PO TABS: 20.0000 mg | ORAL_TABLET | Freq: Every day | ORAL | Status: DC

## 2023-07-12 MED ORDER — ATORVASTATIN CALCIUM 10 MG PO TABS
20.0000 mg | ORAL_TABLET | Freq: Every day | ORAL | Status: DC
  Administered 2023-07-13 – 2023-07-23 (×11): 20 mg via ORAL
  Filled 2023-07-12 (×11): qty 2

## 2023-07-12 MED ORDER — OSMOLITE 1.2 CAL PO LIQD: 237.0000 mL | Freq: Three times a day (TID) | ORAL | Status: DC

## 2023-07-12 MED ORDER — POLYETHYLENE GLYCOL 3350 17 G PO PACK: 17.0000 g | PACK | Freq: Once | ORAL | Status: DC

## 2023-07-12 MED ORDER — ALUM & MAG HYDROXIDE-SIMETH 200-200-20 MG/5ML PO SUSP
30.0000 mL | ORAL | Status: DC | PRN
  Administered 2023-07-22: 30 mL via ORAL
  Filled 2023-07-12: qty 30

## 2023-07-12 MED ORDER — CHOLECALCIFEROL 10 MCG/ML (400 UNIT/ML) PO LIQD
4000.0000 [IU] | Freq: Two times a day (BID) | ORAL | Status: DC
  Administered 2023-07-12 – 2023-07-13 (×2): 4000 [IU]
  Filled 2023-07-12 (×2): qty 10

## 2023-07-12 MED ORDER — TRAZODONE HCL 50 MG PO TABS
25.0000 mg | ORAL_TABLET | Freq: Every evening | ORAL | Status: DC | PRN
Start: 2023-07-12 — End: 2023-07-12

## 2023-07-12 MED ORDER — LEVETIRACETAM 100 MG/ML PO SOLN
500.0000 mg | Freq: Two times a day (BID) | ORAL | Status: DC
  Filled 2023-07-12: qty 5

## 2023-07-12 MED ORDER — PROCHLORPERAZINE MALEATE 5 MG PO TABS: 5.0000 mg | ORAL_TABLET | Freq: Four times a day (QID) | ORAL | Status: DC | PRN

## 2023-07-12 MED ORDER — QUETIAPINE FUMARATE 25 MG PO TABS: 25.0000 mg | ORAL_TABLET | Freq: Every day | ORAL | Status: DC

## 2023-07-12 MED ORDER — FAMOTIDINE 20 MG PO TABS
20.0000 mg | ORAL_TABLET | Freq: Two times a day (BID) | ORAL | Status: DC
Start: 2023-07-12 — End: 2023-07-12

## 2023-07-12 MED ORDER — FLEET ENEMA RE ENEM: 1.0000 | ENEMA | Freq: Once | RECTAL | Status: DC | PRN

## 2023-07-12 MED ORDER — POLYETHYLENE GLYCOL 3350 17 G PO PACK: 17.0000 g | PACK | Freq: Every day | ORAL | Status: DC

## 2023-07-12 MED ORDER — APIXABAN 5 MG PO TABS
5.0000 mg | ORAL_TABLET | Freq: Two times a day (BID) | ORAL | Status: DC
Start: 2023-07-12 — End: 2023-07-12

## 2023-07-12 MED ORDER — APIXABAN 5 MG PO TABS: 5.0000 mg | ORAL_TABLET | Freq: Two times a day (BID) | ORAL | Status: DC

## 2023-07-12 MED ORDER — ORAL CARE MOUTH RINSE
15.0000 mL | OROMUCOSAL | Status: DC
  Administered 2023-07-12 – 2023-07-24 (×41): 15 mL via OROMUCOSAL

## 2023-07-12 MED ORDER — GUAIFENESIN-DM 100-10 MG/5ML PO SYRP: 5.0000 mL | ORAL_SOLUTION | Freq: Four times a day (QID) | ORAL | Status: DC | PRN

## 2023-07-12 MED ORDER — PROCHLORPERAZINE 25 MG RE SUPP: 12.5000 mg | Freq: Four times a day (QID) | RECTAL | Status: DC | PRN

## 2023-07-12 MED ORDER — ORAL CARE MOUTH RINSE: 15.0000 mL | OROMUCOSAL | Status: DC | PRN

## 2023-07-12 MED ORDER — DIPHENHYDRAMINE HCL 25 MG PO CAPS: 25.0000 mg | ORAL_CAPSULE | Freq: Four times a day (QID) | ORAL | Status: DC | PRN

## 2023-07-12 MED ORDER — CEPHALEXIN 250 MG PO CAPS
500.0000 mg | ORAL_CAPSULE | Freq: Three times a day (TID) | ORAL | Status: DC
Start: 2023-07-12 — End: 2023-07-12

## 2023-07-12 MED ORDER — LIDOCAINE HCL URETHRAL/MUCOSAL 2 % EX GEL: CUTANEOUS | Status: DC | PRN

## 2023-07-12 MED ORDER — TOPIRAMATE 25 MG PO TABS
25.0000 mg | ORAL_TABLET | Freq: Every day | ORAL | Status: DC
  Administered 2023-07-13 – 2023-07-23 (×11): 25 mg via ORAL
  Filled 2023-07-12 (×11): qty 1

## 2023-07-12 MED ORDER — METOPROLOL SUCCINATE ER 25 MG PO TB24
25.0000 mg | ORAL_TABLET | Freq: Every day | ORAL | Status: DC
  Administered 2023-07-13 – 2023-07-22 (×10): 25 mg via ORAL
  Filled 2023-07-12 (×11): qty 1

## 2023-07-12 MED ORDER — QUETIAPINE FUMARATE 25 MG PO TABS
12.5000 mg | ORAL_TABLET | Freq: Every day | ORAL | Status: DC
  Administered 2023-07-12: 12.5 mg
  Filled 2023-07-12: qty 1

## 2023-07-12 MED ORDER — BISACODYL 10 MG RE SUPP: 10.0000 mg | Freq: Every day | RECTAL | Status: DC | PRN

## 2023-07-12 MED ORDER — LEVETIRACETAM 100 MG/ML PO SOLN: 500.0000 mg | Freq: Two times a day (BID) | ORAL | Status: DC

## 2023-07-12 MED ORDER — PHENAZOPYRIDINE HCL 100 MG PO TABS
100.0000 mg | ORAL_TABLET | Freq: Three times a day (TID) | ORAL | Status: DC
  Filled 2023-07-12: qty 1

## 2023-07-12 MED ORDER — LORAZEPAM 0.5 MG PO TABS: 0.5000 mg | ORAL_TABLET | Freq: Three times a day (TID) | ORAL | Status: DC | PRN

## 2023-07-12 MED ORDER — PHENAZOPYRIDINE HCL 100 MG PO TABS
100.0000 mg | ORAL_TABLET | Freq: Three times a day (TID) | ORAL | Status: AC
  Administered 2023-07-12 – 2023-07-14 (×5): 100 mg via ORAL
  Filled 2023-07-12 (×6): qty 1

## 2023-07-12 MED ORDER — PHENAZOPYRIDINE HCL 100 MG PO TABS
100.0000 mg | ORAL_TABLET | Freq: Three times a day (TID) | ORAL | Status: DC
  Filled 2023-07-12 (×2): qty 1

## 2023-07-12 MED ORDER — CEPHALEXIN 250 MG PO CAPS
500.0000 mg | ORAL_CAPSULE | Freq: Three times a day (TID) | ORAL | Status: AC
  Administered 2023-07-12 – 2023-07-15 (×9): 500 mg via ORAL
  Filled 2023-07-12 (×9): qty 2

## 2023-07-12 MED ORDER — IPRATROPIUM-ALBUTEROL 0.5-2.5 (3) MG/3ML IN SOLN: 3.0000 mL | Freq: Four times a day (QID) | RESPIRATORY_TRACT | Status: DC | PRN

## 2023-07-12 MED ORDER — FREE WATER: 60.0000 mL | Freq: Three times a day (TID) | Status: DC

## 2023-07-12 MED ORDER — FAMOTIDINE 20 MG PO TABS
20.0000 mg | ORAL_TABLET | Freq: Two times a day (BID) | ORAL | Status: DC
  Administered 2023-07-12 – 2023-07-24 (×24): 20 mg via ORAL
  Filled 2023-07-12 (×25): qty 1

## 2023-07-12 MED ORDER — TRAZODONE HCL 50 MG PO TABS: 25.0000 mg | ORAL_TABLET | Freq: Every evening | ORAL | Status: DC | PRN

## 2023-07-12 MED ORDER — CEPHALEXIN 250 MG PO CAPS: 500.0000 mg | ORAL_CAPSULE | Freq: Three times a day (TID) | ORAL | Status: DC

## 2023-07-12 MED ORDER — OSMOLITE 1.2 CAL PO LIQD: 237.0000 mL | Freq: Three times a day (TID) | ORAL | Status: DC | PRN

## 2023-07-12 MED ORDER — APIXABAN 5 MG PO TABS
5.0000 mg | ORAL_TABLET | Freq: Two times a day (BID) | ORAL | Status: DC
  Administered 2023-07-12 – 2023-07-24 (×24): 5 mg via ORAL
  Filled 2023-07-12 (×25): qty 1

## 2023-07-12 MED ORDER — TOPIRAMATE 25 MG PO TABS: 25.0000 mg | ORAL_TABLET | Freq: Every day | ORAL | Status: DC

## 2023-07-12 MED ORDER — ACETAMINOPHEN 325 MG PO TABS
325.0000 mg | ORAL_TABLET | ORAL | Status: DC | PRN
  Administered 2023-07-12 – 2023-07-22 (×9): 650 mg via ORAL
  Filled 2023-07-12 (×10): qty 2

## 2023-07-12 MED ORDER — FREE WATER
60.0000 mL | Freq: Three times a day (TID) | Status: DC
  Administered 2023-07-12 – 2023-07-13 (×4): 60 mL

## 2023-07-12 MED ORDER — LORAZEPAM 0.5 MG PO TABS
0.5000 mg | ORAL_TABLET | Freq: Three times a day (TID) | ORAL | Status: DC | PRN
Start: 2023-07-12 — End: 2023-07-12

## 2023-07-12 MED ORDER — GUAIFENESIN-DM 100-10 MG/5ML PO SYRP
5.0000 mL | ORAL_SOLUTION | Freq: Four times a day (QID) | ORAL | Status: DC | PRN
Start: 2023-07-12 — End: 2023-07-12

## 2023-07-12 MED ORDER — CITALOPRAM HYDROBROMIDE 20 MG PO TABS
20.0000 mg | ORAL_TABLET | Freq: Every day | ORAL | Status: DC
  Administered 2023-07-13 – 2023-07-24 (×12): 20 mg via ORAL
  Filled 2023-07-12 (×13): qty 1

## 2023-07-12 MED ORDER — DICLOFENAC SODIUM 1 % EX GEL
2.0000 g | Freq: Four times a day (QID) | CUTANEOUS | Status: DC
  Administered 2023-07-12 – 2023-07-24 (×36): 2 g via TOPICAL
  Filled 2023-07-12: qty 100

## 2023-07-12 MED ORDER — LEVETIRACETAM 100 MG/ML PO SOLN
500.0000 mg | Freq: Two times a day (BID) | ORAL | Status: DC
  Administered 2023-07-12 – 2023-07-23 (×22): 500 mg via ORAL
  Filled 2023-07-12 (×22): qty 5

## 2023-07-12 NOTE — H&P (Addendum)
 Physical Medicine and Rehabilitation Admission H&P    Chief Complaint  Patient presents with   Functional deficits due to TBI/stroke    HPI: [name]. Pointer is a 73 year old female with history of HTN, DVT- on Eliquis, esophageal stricture s/p dilatation, OSA, depression; who was admitted on 06/25/23 with reports of fall where she struck her head followed by 3-4 days of progressive HA and mental status changes. She has seizure type activity and was screaming in pain in ED. CT head performed revealing large left cerebral SDH measures 1.1 cm with 1.2 cm rightward midline shift with early uncal herniation and mass effect on left lateral ventricular system with concerns of entrapment. Eliquis reversed with Adexxa and noted to be anemic with Hgb 6. She was evaluated by Dr. Myer Haff and was taken to OR for left craniotomy for evacuation of hematoma and received 2 units PRBC post op. SBP goal < 160. She had increase in intensity and frequency of HA and noted to be creaming and agitated on 02/16. Head CT repeated and showed no reaccumulation of SDH, stable interhemispheric SDH thickness with trace midline shift without entrapment. She required IV fentanyl for pain control and required cardene drip for BP control. She continued to have issues with pain control and repeat CT head 02/19 without significant change.   EEG done due to bouts of agitation and showed cortical dysfunction from left temporal region likely due to SDH and no seizures. She continued to have waxing and waning of MS with RUE weakness and minimal verbal output. She was found to have E coli/ citrobacter UTI on 02/21 and treated with 5 day course of Ceftriaxone. Electrolyte abnormalities supplemented and PEG tube placed 07/05/23 due to poor po intake. She continued to have RUE weakness. NS recommended resuming Eliquis on POD # 14 and may require MMA embolization pending repeat CT head 4 weeks post op. She developed fevers up to 101 on 02/26  and BC/respiratory cultures negative and Urine culture showed >100,000 colonies of enterococcus felt to be contaminant.  She has had right shoulder pain and MRI showed moderate to high grade partial thickness tear of anterior supraspinatus tendon, partial thickness tear biceps tendon, moderate to severe degenerative change in [facility] joint and mild subacromial/subdeltoid bursitis. Shew as treated for oral thrush with fluconazole.   She has also had issue with PAF and metoprolol has been titrated for rate control. She continued to have waxing and waning of MS and MRI brain repeated on 02/26 revealing small acute cortical infarcts in left frontal and parietal lobes and no change in bilateral cerebral hemispheric SDH. Dr. Selina Cooley recommended starting IV heparin per stroke protocol  with transition to Eliquis once cleared and right sided impairment due to combination of weakness and neglect.  2 D echo showed EF 55-60% with no wall abnormality and negative bubble study. Mentation has improved with improvement in RUE movement.  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 She was independent PTA and CIR recommended due to functional decline.    Review of Systems  Constitutional:  Negative for fever.  HENT:  Negative for congestion, hearing loss and tinnitus.   Eyes:  Negative for blurred vision and double vision.       Needs cataracts removed  Respiratory:  Positive for cough. Negative for shortness of breath.   Cardiovascular:  Negative for chest pain and palpitations.  Gastrointestinal:  Positive for diarrhea. Negative for abdominal pain, constipation and heartburn.  Genitourinary:  Positive for urgency. Negative for dysuria.       Unable to urinate. Has been incontinent at nights for past couple of days.   Musculoskeletal:  Positive for back pain and myalgias.  Skin:  Negative for rash.  Neurological:   Positive for dizziness (with head turn to the right), speech change, weakness and headaches.  Psychiatric/Behavioral:  Positive for depression. The patient is not nervous/anxious.     Past Medical History:  Diagnosis Date   Bradycardia    Chest pain    Constipation    Depression    Diverticulitis    Diverticulosis of colon    DOE (dyspnea on exertion)    DVT (deep venous thrombosis) (HCC)    Esophageal stricture    Hyperlipemia    Hypertension    IBS (irritable bowel syndrome)    Sleep apnea     Past Surgical History:  Procedure Laterality Date   APPENDECTOMY     BLOOD CLOT  2004   RIGHT LEG   CHOLECYSTECTOMY     COLON SURGERY     CRANIOTOMY Left 06/25/2023   Procedure: CRANIOTOMY HEMATOMA EVACUATION SUBDURAL;  Surgeon: Venetia Night, MD;  Location: [facility] ORS;  Service: Neurosurgery;  Laterality: Left;   GALLBLADDER SURGERY  2003   IR GASTROSTOMY TUBE MOD SED  07/05/2023   NISSEN FUNDOPLICATION     PLANTAR FASCIA SURGERY  2007   sigmoid colectomy  10/21/2010   pelvic anastomosis   VAGINAL HYSTERECTOMY      Family History  Problem Relation Age of Onset   Heart disease Mother    Crohn's disease Sister    Colon polyps Sister    Irritable bowel syndrome Brother    Brain cancer Father    Colitis Sister    Diabetes Sister    Breast cancer Cousin    Colon cancer Neg Hx     Social History: Lives alone and was independent PTA>  reports that she has never smoked. She has never used smokeless tobacco. She reports that she does not drink alcohol and does not use drugs.    Allergies  Allergen Reactions   Dicyclomine Other (See Comments)    REACTION: Choking   Lisinopril Other (See Comments)    REACTION: Choking   Losartan Shortness Of Breath   Ace Inhibitors Other (See Comments)    Unknown to patient   Penicillins Itching and Rash    Medications Prior to Admission  Medication Sig Dispense Refill   albuterol (PROVENTIL HFA;VENTOLIN HFA) 108 (90 Base) MCG/ACT  inhaler Inhale 2 puffs into the lungs every 6 (six) hours as needed for wheezing or shortness of breath. 1 Inhaler 0   apixaban (ELIQUIS) 5 MG TABS tablet Take 1 tablet (5 mg total) by mouth 2 (two) times daily. 60 tablet 0   atorvastatin (LIPITOR) 20 MG tablet Take 20 mg by mouth daily.     citalopram (CELEXA) 20 MG tablet Take 20 mg by mouth daily.     famotidine (PEPCID) 20 MG tablet Take 20 mg by mouth at bedtime.      isosorbide mononitrate (IMDUR) 30 MG 24 hr tablet Take 30 mg by mouth daily.     levETIRAcetam (KEPPRA) 100 MG/ML solution Place 5 mLs (500 mg total) into feeding tube 2 (two) times daily.     LORazepam (ATIVAN) 0.5 MG tablet Take 0.5 mg by mouth every 8 (eight) hours as needed. For anxiety  metoprolol succinate (TOPROL-XL) 25 MG 24 hr tablet TAKE 1 TABLET BY MOUTH TWICE DAILY FOR 90 DAYS  1   Nutritional Supplements (FEEDING SUPPLEMENT, OSMOLITE 1.2 CAL,) LIQD Place 237 mLs into feeding tube 3 (three) times daily after meals.     Water For Irrigation, Sterile (FREE WATER) SOLN Place 60 mLs into feeding tube 3 (three) times daily after meals.        Home: Home Living Family/patient expects to be discharged to:: Private residence Living Arrangements: Alone Available Help at Discharge: Family, Available 24 hours/day Type of Home: Mobile home Home Access: Stairs to enter [employer] of Steps: 5 Entrance Stairs-Rails: Right, Left, Can reach both Home Layout: One level Bathroom Shower/Tub: [employer] Accessibility: Yes Home Equipment: None Additional Comments: per sister in law   Functional History: Prior Function Prior Level of Function : Independent/Modified Independent, Driving Mobility Comments: independent prior to admission ADLs Comments: independent   Functional Status:  Mobility: Bed Mobility Overal bed mobility: Needs Assistance Bed Mobility: Supine to Sit Rolling: Mod assist Supine to sit: Contact  guard, HOB elevated, Used rails Sit to supine: Max assist General bed mobility comments: increased effort, time to complete with VC for bed rail use coming to the R side of the bed Transfers Overall transfer level: Needs assistance Equipment used: 1 person hand held assist Transfers: Sit to/from Stand, Bed to chair/wheelchair/BSC Sit to Stand: Contact guard assist Bed to/from chair/wheelchair/BSC transfer type:: Step pivot Stand pivot transfers: Min assist Step pivot transfers: Min assist  Lateral/Scoot Transfers: Total assist, +2 physical assistance General transfer comment: RUE supported, R knee blocked for standing Ambulation/Gait Ambulation/Gait assistance: Min assist, Contact guard assist Gait Distance (Feet):  (36ft twice, and third time 66ft) General Gait Details: able to use L rail, ambulate several bouts with CGA-minA, progressed to ambulated with bilateral UE support, CGA. very close guard for potential R knee buckling Pre-gait activities: emphasis on posture and increased standing tolerance. standing tolerance of around 30 seconds   ADL: ADL Overall ADL's : Needs assistance/impaired Eating/Feeding: Sitting, Maximal assistance Eating/Feeding Details (indicate cue type and reason): +2 assist to maintain static sitting at EOB with MAX A to support loading spoon and to bring food to mouth using LUE. Pt does attempt to grip spoon and scoop bites on her own with poor coordination appreciated and decreased accuracy with bringing hand to mouth. Grooming: Sitting, Wash/dry face, Maximal assistance Grooming Details (indicate cue type and reason): +2 assist to maintain static sitting at EOB. MAX A to bring hand to face this date. Caregiver at bedside provides cueing for pt to wash face. Pt noted to have some difficulty with this task, dabs more at face with cloth vs. using a washing motion. Appears more limited by cognition this date. Lower Body Bathing: Total assistance, Bed level Lower  Body Bathing Details (indicate cue type and reason): TOTAL A to don bilat socks at bed level. Upper Body Dressing : Sitting, Moderate assistance, Minimal assistance Upper Body Dressing Details (indicate cue type and reason): educated in hemi techniques Functional mobility during ADLs: +2 for physical assistance, Total assistance General ADL Comments: MAX A hand over hand face washing using non-dominant L hand. MAX A don B socks in sitting, +2 for trunk control. MAX A don/doff gown in sitting   Cognition: Cognition Overall Cognitive Status: Impaired/Different from baseline Arousal/Alertness: Awake/alert Orientation Level: Oriented to person, Oriented to place, Disoriented to time, Disoriented to situation Cognition Arousal: Alert Behavior During Therapy: [facility]  for tasks assessed/performed Overall Cognitive Status: Impaired/Different from baseline   Blood pressure 132/69, pulse 96, temperature 98.7 F (37.1 C), temperature source Oral, resp. rate 18, height 5\' 1"  (1.549 m), weight 59.7 kg, SpO2 98%.  General: No apparent distress HEENT: Midline crani incision healing well, sclera anicteric, oral mucosa pink and moist Neck: Supple without JVD or lymphadenopathy Heart: Reg rate and rhythm. No murmurs rubs or gallops Chest: CTA bilaterally without wheezes, rales, or rhonchi; no distress Abdomen: Soft, non-tender, non-distended, bowel sounds positive. Well healed old midline incision.   Extremities: No clubbing, cyanosis, or edema. Pulses are 2+ Psych: Tearful at times Skin: Clean and intact without signs of breakdown Neuro:    Mental Status: AAOx3, memory intact, fund of knowledge appropriate. R neglect Speech/Languate: Naming and repetition intact, dysarthric, Ataxic speech with occasional jargon. Follows commands  CRANIAL NERVES: II: PERRL. Visual fields full III, IV, VI: EOM intact, no gaze preference or deviation V: normal sensation bilaterally VII: no asymmetry VIII: normal  hearing to speech IX, X: normal palatal elevation XI: decreased heat turn b/l and decreased shoulder shrug on R XII: Tongue midline   MOTOR: RUE:2+/5 Deltoid, 3/5 Biceps, 3/5 Triceps,3/5 Grip LUE: 4/5 Deltoid, 4/5 Biceps, 4/5 Triceps, 4/5 Grip RLE: HF 4-/5, KE 4-/5, minimal movement APF and ADF LLE: HF 3/5, KE 3/5, minimal movement APF and ADF   REFLEXES:DTR 3+ RUE, 2+ LUE, B/L Patella 3+  SENSORY: LT altered in RUE   Moderate increased tone RUE and mildly increased tone RLE > LLE    Results for orders placed or performed during the hospital encounter of 07/12/23 (from the past 48 hours)  Glucose, capillary     Status: Abnormal   Collection Time: 07/12/23  4:58 PM  Result Value Ref Range   Glucose-Capillary 104 (H) 70 - 99 mg/dL    Comment: Glucose reference range applies only to samples taken after fasting for at least 8 hours.   US Carotid Bilateral Result Date: 07/11/2023 CLINICAL DATA:  Stroke EXAM: BILATERAL CAROTID DUPLEX ULTRASOUND TECHNIQUE: Wallace Cullens scale imaging, color Doppler and duplex ultrasound were performed of bilateral carotid and vertebral arteries in the neck. COMPARISON:  None available FINDINGS: Criteria: Quantification of carotid stenosis is based on velocity parameters that correlate the residual internal carotid diameter with NASCET-based stenosis levels, using the diameter of the distal internal carotid lumen as the denominator for stenosis measurement. The following velocity measurements were obtained: RIGHT ICA: 76/15 cm/sec CCA: 84/11 cm/sec SYSTOLIC ICA/CCA RATIO:  0.9 ECA: 119 cm/sec LEFT ICA: 110/18 cm/sec CCA: 87/12 cm/sec SYSTOLIC ICA/CCA RATIO:  1.3 ECA: 130 cm/sec RIGHT CAROTID ARTERY: Minimal atheromatous plaque of the right proximal internal carotid artery. RIGHT VERTEBRAL ARTERY:  Antegrade flow. LEFT CAROTID ARTERY:  No significant atheromatous plaque. LEFT VERTEBRAL ARTERY:  Antegrade flow. IMPRESSION: No significant stenosis of internal carotid  arteries. Electronically Signed   By: Acquanetta Belling M.D.   On: 07/11/2023 16:30      Blood pressure 132/69, pulse 96, temperature 98.7 F (37.1 C), temperature source Oral, resp. rate 18, height 5\' 1"  (1.549 m), weight 59.7 kg, SpO2 98%.  Medical Problem List and Plan: 1. 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  -patient may shower, cover G tube  -ELOS/Goals: 10-12 days, Sup PT, Sup-Min A OT/ST  -Adit to CIR   -RUE PRAFO and WHO 2.  DVT/A fib/Antithrombotics: -DVT/anticoagulation:  Pharmaceutical:  Eliquis resumed on 02/28 POD#14  -antiplatelet therapy:  N/A 3. Headaches/Pain Management: Tylenol prn not fully effective.   --will add low dose Topamax and titrate as needed.  --continue Seroquel with prn trazodone.  4. Mood/Behavior/Sleep: LCSW to follow for evaluation and support.   -antipsychotic agents: On Seroquel to help with sleep.  5. Neuropsych/cognition: This patient is capable of making decisions on her own behalf. 6. Skin/Wound Care: Routine pressure relief measures.  7. Fluids/Electrolytes/Nutrition: Monitor I/O. Check CMET in am.  8. SDH s/p crani 2/14: Repeat CT head in 4 weeks --May need MMA embolization in the future per NS.  9. Seizure activity in ED?: On Keppra for seizure prophylaxis. 10. HTN: Poorly controlled--on metoprolol XL 25mg . SBP goal < 160  --was on Imdur and aldactone PTA 11.  PAF: Monitor HR TID--on metoprolol.   -HR 90s, consider increase metoprolol dose    07/12/2023    7:35 PM 07/12/2023    4:16 PM 07/12/2023    4:02 PM  Vitals with BMI  Height  5\' 1"    Weight  131 lbs 10 oz 131 lbs 10 oz  BMI  24.88 24.88  Systolic 132 143 161  Diastolic 69 78 78  Pulse 96 92 92    12. Urinary retention: Unable to use bed pan and reports problems with urination.  --will order bladder scans, PVR checks. Start treatment for  UTI.  13. Enterococcus UTI: UA/UCS positive 02/26 and 03/01--> 100,000 colonies and symptomatic. Prior treatment with ceftriaxone --will start Keflex as well as pyridium for dysuria/hesitancy.   --Needs to get to [facility] to be able to void.  14. Seizure prophylaxis: Continue Keppra 15. Dysphagia: On dysphagia 2 diet with thin liquids. Will change pills to po route. PEG tube placed 07/05/23.  16. H/o anxiety/Depression: Continue Celexa.  17. ABLA/Hx of IDA: S/p 2 u PRBC. Recheck CBC in am.  18.Right RTC tear: Add voltaren gel to help with pain.    Jacquelynn Cree, PA-C 07/12/2023  I have personally performed a face to face diagnostic evaluation of this patient and formulated the key components of the plan.  Additionally, I have personally reviewed laboratory data, imaging studies, as well as relevant notes and concur with the physician assistant's documentation above.  The patient's status has not changed from the original H&P.  Any changes in documentation from the acute care chart have been noted above.  Fanny Dance, MD, [place]

## 2023-07-12 NOTE — Progress Notes (Signed)
 Nutrition Follow-up  DOCUMENTATION CODES:   Not applicable  INTERVENTION:   -Continue dysphagia 2 diet  -Magic cup TID with meals, each supplement provides 290 kcal and 9 grams of protein  -Continue TF via PEG:   Transition to bolus feeds-   237 ml Osmolite 1.2 TID (administer dose if pt eats less than 50% of meal)   30 ml free water flush before and after each feeding administration   Tube feeding regimen provides 855 kcal (100% of needs), 40 grams of protein, and 585 ml of H2O.  Total free water: 765 ml daily   NUTRITION DIAGNOSIS:   Inadequate oral intake related to lethargy/confusion as evidenced by NPO status.  Ongoing  GOAL:   Patient will meet greater than or equal to 90% of their needs  Progressing   MONITOR:   Diet advancement, Labs, Weight trends, TF tolerance, Skin, I & O's  REASON FOR ASSESSMENT:   Consult Enteral/tube feeding initiation and management  ASSESSMENT:   73 y/o female with h/o HTN, esophageal stricture, PAF, IDA, diverticulitis, COPD, s/p cardiac catheterization, s/p laparoscopic hiatal hernia repair and Nissen fundoplication 2007, anxiety, depression, SVT, HLD, GERD, DVT, IBS-C and OSA who is admitted with traumatic subdural hematoma with left-to-right midline shift s/p left craniotomy for evacuation of hematoma 2/14 complicated by seizures and AMS.  2/14- s/p left-sided craniotomy for hematoma evacuation  2/18- NGT TF 2/20- s/p BSE- thin liquids 2/21- s/p BSE- ice chips after oral care 2/22- s/p BSE- NPO 2/24- s/p PEG 2/25- TF resumed 2/27- s/p BSE- advanced to trials of dysphagia 2 diet with thin liquids  Reviewed I/O's: +1.5 L x 24 hours and +14.5 L since 06/28/23   Case discussed with MD and SLP. Per SLP, pt with improved oral intake. Pt consumed a chicken pot pie from [facility] under supervision of SLP yesterday. Pt reports that she is motivated to eat more, however, feels full due to continuous TF. Pt more alert and noted meal  completions 30-50%.   Continuous TF stopped yesterday. Discussed with MD and SLP; will transition to bolus feeds- administer bolus of pt consumes less than 50% of meal.   Per TOC notes, plan for SNF placement once medically stable.   Medications reviewed and include keppra and miralax.   Labs reviewed: CBGS: 86-110 (inpatient orders for glycemic control are none).    Diet Order:   Diet Order             DIET DYS 2 Room service appropriate? Yes; Fluid consistency: Thin  Diet effective now                   EDUCATION NEEDS:   No education needs have been identified at this time  Skin:  Skin Assessment: Skin Integrity Issues: Skin Integrity Issues:: Incisions Incisions: closed lt head  Last BM:  07/11/23 (type 4)  Height:   Ht Readings from Last 1 Encounters:  07/09/23 5' 0.98" (1.549 m)    Weight:   Wt Readings from Last 1 Encounters:  07/12/23 61.5 kg    Ideal Body Weight:  47.7 kg  BMI:  Body mass index is 25.63 kg/m.  Estimated Nutritional Needs:   Kcal:  1500-1700kcal/day  Protein:  75-85g/day  Fluid:  1.3-1.5L/day    Levada Schilling, RD, LDN, CDCES Registered Dietitian III Certified Diabetes Care and Education Specialist If unable to reach this RD, please use "RD Inpatient" group chat on secure chat between hours of 8am-4 pm daily

## 2023-07-12 NOTE — H&P (Shared)
 Physical Medicine and Rehabilitation Admission H&P    Chief Complaint  Patient presents with   Functional deficits due to TBI/stroke    HPI: [name]. Un is a 73 year old female with history of HTN, DVT- on Eliquis, esophageal stricture s/p dilatation, OSA, depression; who was admitted on 06/25/23 with reports of fall where she struck her head followed by 3-4 days of progressive HA and mental status changes. She has seizure type activity and was screaming in pain in ED. CT head performed revealing large left cerebral SDH measures 1.1 cm with 1.2 cm rightward midline shift with early uncal herniation and mass effect on left lateral ventricular system with concerns of entrapment. Eliquis reversed with Adexxa and noted to be anemic with Hgb 6. She was evaluated by Dr. Myer Haff and was taken to OR for left craniotomy for evacuation of hematoma and received 2 units PRBC post op. SBP goal < 160. She had increase in intensity and frequency of HA and noted to be creaming and agitated on 02/16. Head CT repeated and showed no reaccumulation of SDH, stable interhemispheric SDH thickness with trace midline shift without entrapment. She required IV fentanyl for pain control and required cardene drip for BP control. She continued to have issues with pain control and repeat CT head 02/19 without significant change.   EEG done due to bouts of agitation and showed cortical dysfunction from left temporal region likely due to SDH and no seizures. She continued to have waxing and waning of MS with RUE weakness and minimal verbal output. She was found to have E coli/ citrobacter UTI on 02/21 and treated with 5 day course of Ceftriaxone. Electrolyte abnormalities supplemented and PEG tube placed due to poor po intake. She continued to have RUE weakness. NS recommended resuming Eliquis on POD # 14 and may require MMA embolization pending repeat CT head 4 weeks post op. She developed fevers up to 101 on 02/26 and  BC/respiratory cultures negative and Urine culture showed >100,000 colonies of enterococcus felt to be contaminant.  She has had right shoulder pain and MRI showed moderate to high grade partial thickness tear of anterior supraspinatus tendon, partial thickness tear biceps tendon, moderate to severe degenerative change in [facility] joint and mild subacromial/subdeltoid bursitis.   She has also had issue with PAF and metoprolol has been titrated for rate control. She continued to have waxing and waning of MS and MRI brain repeated on 02/26 revealing small acute cortical infarcts in left frontal and parietal lobes and no change in bilateral cerebral hemispheric SDH. Dr. Selina Cooley recommended starting IV heparin per stroke protocol  with transition to Eliquis once cleared and right sided impairment due to combination of weakness and neglect.  2 D echo showed EF 55-60% with no wall abnormality and negative bubble study. Mentation has improved with improvement in RUE movement.  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 She was independent PTA and CIR recommended due to functional decline.    Review of Systems  HENT:  Negative for hearing loss and tinnitus.   Eyes:  Negative for blurred vision and double vision.       Needs cataracts removed  Respiratory:  Positive for cough. Negative for shortness of breath.   Cardiovascular:  Negative for chest pain and palpitations.  Gastrointestinal:  Negative for abdominal pain, constipation and heartburn.  Genitourinary:  Positive for urgency.  Unable to urinate. Has been incontinent at nights for past couple of days.   Musculoskeletal:  Positive for back pain and myalgias.  Neurological:  Positive for dizziness (with head turn to the right), speech change, weakness and headaches.  Psychiatric/Behavioral:  The patient is not nervous/anxious.     Past Medical  History:  Diagnosis Date   Bradycardia    Chest pain    Constipation    Depression    Diverticulitis    Diverticulosis of colon    DOE (dyspnea on exertion)    DVT (deep venous thrombosis) (HCC)    Esophageal stricture    Hyperlipemia    Hypertension    IBS (irritable bowel syndrome)    Sleep apnea     Past Surgical History:  Procedure Laterality Date   APPENDECTOMY     BLOOD CLOT  2004   RIGHT LEG   CHOLECYSTECTOMY     COLON SURGERY     CRANIOTOMY Left 06/25/2023   Procedure: CRANIOTOMY HEMATOMA EVACUATION SUBDURAL;  Surgeon: Venetia Night, MD;  Location: [facility] ORS;  Service: Neurosurgery;  Laterality: Left;   GALLBLADDER SURGERY  2003   IR GASTROSTOMY TUBE MOD SED  07/05/2023   NISSEN FUNDOPLICATION     PLANTAR FASCIA SURGERY  2007   sigmoid colectomy  10/21/2010   pelvic anastomosis   VAGINAL HYSTERECTOMY      Family History  Problem Relation Age of Onset   Heart disease Mother    Crohn's disease Sister    Colon polyps Sister    Irritable bowel syndrome Brother    Brain cancer Father    Colitis Sister    Diabetes Sister    Breast cancer Cousin    Colon cancer Neg Hx     Social History: Lives alone and was independent PTA>  reports that she has never smoked. She has never used smokeless tobacco. She reports that she does not drink alcohol and does not use drugs.    Allergies  Allergen Reactions   Dicyclomine Other (See Comments)    REACTION: Choking   Lisinopril Other (See Comments)    REACTION: Choking   Losartan Shortness Of Breath   Ace Inhibitors Other (See Comments)    Unknown to patient   Penicillins Itching and Rash    Medications Prior to Admission  Medication Sig Dispense Refill   albuterol (PROVENTIL HFA;VENTOLIN HFA) 108 (90 Base) MCG/ACT inhaler Inhale 2 puffs into the lungs every 6 (six) hours as needed for wheezing or shortness of breath. 1 Inhaler 0   apixaban (ELIQUIS) 5 MG TABS tablet Take 1 tablet (5 mg total) by mouth 2 (two)  times daily. 60 tablet 0   atorvastatin (LIPITOR) 20 MG tablet Take 20 mg by mouth daily.     citalopram (CELEXA) 20 MG tablet Take 20 mg by mouth daily.     famotidine (PEPCID) 20 MG tablet Take 20 mg by mouth at bedtime.      isosorbide mononitrate (IMDUR) 30 MG 24 hr tablet Take 30 mg by mouth daily.     LORazepam (ATIVAN) 0.5 MG tablet Take 0.5 mg by mouth every 8 (eight) hours as needed. For anxiety     metoprolol succinate (TOPROL-XL) 25 MG 24 hr tablet TAKE 1 TABLET BY MOUTH TWICE DAILY FOR 90 DAYS  1   spironolactone (ALDACTONE) 25 MG tablet Take 1 tablet (25 mg total) by mouth daily. 90 tablet 3    Home: Home Living Family/patient expects to be discharged to:: Private residence Living  Arrangements: Alone Available Help at Discharge: Family, Available 24 hours/day Type of Home: Mobile home Home Access: Stairs to enter [employer] of Steps: 5 Entrance Stairs-Rails: Right, Left, Can reach both Home Layout: One level Bathroom Shower/Tub: Engineer, manufacturing systems: Standard Bathroom Accessibility: Yes Home Equipment: None Additional Comments: per sister in law   Functional History: Prior Function Prior Level of Function : Independent/Modified Independent, Driving Mobility Comments: independent prior to admission ADLs Comments: independent  Functional Status:  Mobility: Bed Mobility Overal bed mobility: Needs Assistance Bed Mobility: Supine to Sit Rolling: Mod assist Supine to sit: Contact guard, HOB elevated, Used rails Sit to supine: Max assist General bed mobility comments: increased effort, time to complete with VC for bed rail use coming to the R side of the bed Transfers Overall transfer level: Needs assistance Equipment used: 1 person hand held assist Transfers: Sit to/from Stand, Bed to chair/wheelchair/BSC Sit to Stand: Contact guard assist Bed to/from chair/wheelchair/BSC transfer type:: Step pivot Stand pivot transfers: Min assist Step  pivot transfers: Min assist  Lateral/Scoot Transfers: Total assist, +2 physical assistance General transfer comment: RUE supported, R knee blocked for standing Ambulation/Gait Ambulation/Gait assistance: Min assist, Contact guard assist Gait Distance (Feet):  (45ft twice, and third time 60ft) General Gait Details: able to use L rail, ambulate several bouts with CGA-minA, progressed to ambulated with bilateral UE support, CGA. very close guard for potential R knee buckling Pre-gait activities: emphasis on posture and increased standing tolerance. standing tolerance of around 30 seconds    ADL: ADL Overall ADL's : Needs assistance/impaired Eating/Feeding: Sitting, Maximal assistance Eating/Feeding Details (indicate cue type and reason): +2 assist to maintain static sitting at EOB with MAX A to support loading spoon and to bring food to mouth using LUE. Pt does attempt to grip spoon and scoop bites on her own with poor coordination appreciated and decreased accuracy with bringing hand to mouth. Grooming: Sitting, Wash/dry face, Maximal assistance Grooming Details (indicate cue type and reason): +2 assist to maintain static sitting at EOB. MAX A to bring hand to face this date. Caregiver at bedside provides cueing for pt to wash face. Pt noted to have some difficulty with this task, dabs more at face with cloth vs. using a washing motion. Appears more limited by cognition this date. Lower Body Bathing: Total assistance, Bed level Lower Body Bathing Details (indicate cue type and reason): TOTAL A to don bilat socks at bed level. Upper Body Dressing : Sitting, Moderate assistance, Minimal assistance Upper Body Dressing Details (indicate cue type and reason): educated in hemi techniques Functional mobility during ADLs: +2 for physical assistance, Total assistance General ADL Comments: MAX A hand over hand face washing using non-dominant L hand. MAX A don B socks in sitting, +2 for trunk control. MAX A  don/doff gown in sitting  Cognition: Cognition Overall Cognitive Status: Impaired/Different from baseline Arousal/Alertness: Awake/alert Orientation Level: Oriented to person, Oriented to place, Disoriented to time, Disoriented to situation Cognition Arousal: Alert Behavior During Therapy: WFL for tasks assessed/performed Overall Cognitive Status: Impaired/Different from baseline   Blood pressure (!) 141/110, pulse 92, temperature 98.8 F (37.1 C), temperature source Oral, resp. rate 18, height 5' 0.98" (1.549 m), weight 61.5 kg, SpO2 100%. Physical Exam Vitals and nursing note reviewed.  Constitutional:      Appearance: She is well-developed.  HENT:     Head:     Comments: Midline crani incision healing well.  Abdominal:     Comments: Well healed old midline incision.  Skin:    General: Skin is warm and dry.  Neurological:     Mental Status: She is alert and oriented to person, place, and time.     Comments: Ataxic speech with occasional jargon. Able to self correct and answer basic orientation questions. She is able to follow simple motor commands without difficulty. Right sided weakness with some tone RUE.      Results for orders placed or performed during the hospital encounter of 06/25/23 (from the past 48 hours)  Urinalysis, w/ Reflex to Culture (Infection Suspected) -Urine, Clean Catch     Status: Abnormal   Collection Time: 07/10/23  2:58 PM  Result Value Ref Range   Specimen Source URINE, CLEAN CATCH    Color, Urine YELLOW (A) YELLOW   APPearance CLOUDY (A) CLEAR   Specific Gravity, Urine 1.014 1.005 - 1.030   pH 7.0 5.0 - 8.0   Glucose, UA NEGATIVE NEGATIVE mg/dL   Hgb urine dipstick NEGATIVE NEGATIVE   Bilirubin Urine NEGATIVE NEGATIVE   Ketones, ur NEGATIVE NEGATIVE mg/dL   Protein, ur NEGATIVE NEGATIVE mg/dL   Nitrite NEGATIVE NEGATIVE   Leukocytes,Ua LARGE (A) NEGATIVE   RBC / HPF 11-20 0 - 5 RBC/hpf   WBC, UA >50 0 - 5 WBC/hpf    Comment:         Reflex urine culture not performed if WBC <=10, OR if Squamous epithelial cells >5. If Squamous epithelial cells >5 suggest recollection.    Bacteria, UA MANY (A) NONE SEEN   Squamous Epithelial / HPF 0-5 0 - 5 /HPF   Mucus PRESENT    Non Squamous Epithelial PRESENT (A) NONE SEEN    Comment: Performed at [facility], [address], [place], [place] [address]  Urine Culture     Status: Abnormal   Collection Time: 07/10/23  2:58 PM   Specimen: Urine, Random  Result Value Ref Range   Specimen Description      URINE, RANDOM Performed at [facility], [address] [place], [place] [address]    Special Requests      NONE Reflexed from [id number] Performed at [facility], [address] [place], [place] [address]    Culture >=100,000 COLONIES/mL ENTEROCOCCUS FAECALIS (A)    Report Status 07/12/2023 FINAL    Organism ID, Bacteria ENTEROCOCCUS FAECALIS (A)       Susceptibility   Enterococcus faecalis - MIC*    AMPICILLIN <=2 SENSITIVE Sensitive     NITROFURANTOIN <=16 SENSITIVE Sensitive     VANCOMYCIN 1 SENSITIVE Sensitive     * >=100,000 COLONIES/mL ENTEROCOCCUS FAECALIS  Heparin level (unfractionated)     Status: Abnormal   Collection Time: 07/10/23  3:08 PM  Result Value Ref Range   Heparin Unfractionated 0.24 (L) 0.30 - 0.70 IU/mL    Comment: (NOTE) The clinical reportable range upper limit is being lowered to >1.10 to align with the FDA approved guidance for the current laboratory assay.  If heparin results are below expected values, and patient dosage has  been confirmed, suggest follow up testing of antithrombin III levels. Performed at [facility], [address] [place], [place] [address]   Heparin level (unfractionated)     Status: Abnormal   Collection Time: 07/10/23 11:57 PM  Result Value Ref Range   Heparin Unfractionated 0.25 (L) 0.30 - 0.70 IU/mL    Comment: (NOTE) The clinical reportable range upper limit is  being lowered to >1.10 to align with the FDA approved guidance for  the current laboratory assay.  If heparin results are below expected values, and patient dosage has  been confirmed, suggest follow up testing of antithrombin III levels. Performed at [facility], [address] [place], [place] [address]   CBC     Status: Abnormal   Collection Time: 07/10/23 11:57 PM  Result Value Ref Range   WBC 10.4 4.0 - 10.5 K/uL   RBC 4.07 3.87 - 5.11 MIL/uL   Hemoglobin 12.7 12.0 - 15.0 g/dL   HCT 60.4 54.0 - 98.1 %   MCV 91.4 80.0 - 100.0 fL   MCH 31.2 26.0 - 34.0 pg   MCHC 34.1 30.0 - 36.0 g/dL   RDW 19.1 47.8 - 29.5 %   Platelets 497 (H) 150 - 400 K/uL   nRBC 0.0 0.0 - 0.2 %    Comment: Performed at [facility], [address] [place], [place] [address]  Glucose, capillary     Status: Abnormal   Collection Time: 07/11/23 12:04 AM  Result Value Ref Range   Glucose-Capillary 110 (H) 70 - 99 mg/dL    Comment: Glucose reference range applies only to samples taken after fasting for at least 8 hours.  Basic metabolic panel     Status: None   Collection Time: 07/11/23  3:16 AM  Result Value Ref Range   Sodium 136 135 - 145 mmol/L   Potassium 4.6 3.5 - 5.1 mmol/L   Chloride 104 98 - 111 mmol/L   CO2 24 22 - 32 mmol/L   Glucose, Bld 95 70 - 99 mg/dL    Comment: Glucose reference range applies only to samples taken after fasting for at least 8 hours.   BUN 21 8 - 23 mg/dL   Creatinine, Ser 8.65 0.44 - 1.00 mg/dL   Calcium 9.7 8.9 - 78.4 mg/dL   GFR, Estimated >69 >62 mL/min    Comment: (NOTE) Calculated using the CKD-EPI Creatinine Equation (2021)    Anion gap 8 5 - 15    Comment: Performed at [facility], [address] [place], [place] [address]  Glucose, capillary     Status: Abnormal   Collection Time: 07/11/23  3:30 AM  Result Value Ref Range   Glucose-Capillary 101 (H) 70 - 99 mg/dL    Comment: Glucose reference range applies only to samples  taken after fasting for at least 8 hours.  Heparin level (unfractionated)     Status: Abnormal   Collection Time: 07/11/23  9:34 AM  Result Value Ref Range   Heparin Unfractionated 0.21 (L) 0.30 - 0.70 IU/mL    Comment: (NOTE) The clinical reportable range upper limit is being lowered to >1.10 to align with the FDA approved guidance for the current laboratory assay.  If heparin results are below expected values, and patient dosage has  been confirmed, suggest follow up testing of antithrombin III levels. Performed at [facility], [address] [place], [place] [address]   Glucose, capillary     Status: None   Collection Time: 07/11/23 12:12 PM  Result Value Ref Range   Glucose-Capillary 88 70 - 99 mg/dL    Comment: Glucose reference range applies only to samples taken after fasting for at least 8 hours.  Glucose, capillary     Status: None   Collection Time: 07/11/23  4:49 PM  Result Value Ref Range   Glucose-Capillary 90 70 - 99 mg/dL    Comment: Glucose reference range applies only to samples taken after fasting for at  least 8 hours.  Glucose, capillary     Status: None   Collection Time: 07/12/23 12:09 AM  Result Value Ref Range   Glucose-Capillary 86 70 - 99 mg/dL    Comment: Glucose reference range applies only to samples taken after fasting for at least 8 hours.  Basic metabolic panel     Status: Abnormal   Collection Time: 07/12/23  4:56 AM  Result Value Ref Range   Sodium 136 135 - 145 mmol/L   Potassium 4.0 3.5 - 5.1 mmol/L   Chloride 102 98 - 111 mmol/L   CO2 23 22 - 32 mmol/L   Glucose, Bld 121 (H) 70 - 99 mg/dL    Comment: Glucose reference range applies only to samples taken after fasting for at least 8 hours.   BUN 18 8 - 23 mg/dL   Creatinine, Ser 1.61 0.44 - 1.00 mg/dL   Calcium 09.6 8.9 - 04.5 mg/dL   GFR, Estimated >40 >98 mL/min    Comment: (NOTE) Calculated using the CKD-EPI Creatinine Equation (2021)    Anion gap 11 5 - 15    Comment:  Performed at [facility], [address] [place], [place] [address]  CBC     Status: Abnormal   Collection Time: 07/12/23  4:56 AM  Result Value Ref Range   WBC 10.7 (H) 4.0 - 10.5 K/uL   RBC 4.22 3.87 - 5.11 MIL/uL   Hemoglobin 12.9 12.0 - 15.0 g/dL   HCT 78.2 95.6 - 21.3 %   MCV 90.3 80.0 - 100.0 fL   MCH 30.6 26.0 - 34.0 pg   MCHC 33.9 30.0 - 36.0 g/dL   RDW 08.6 57.8 - 46.9 %   Platelets 545 (H) 150 - 400 K/uL   nRBC 0.0 0.0 - 0.2 %    Comment: Performed at [facility], [address] [place], [place] [address]  Glucose, capillary     Status: Abnormal   Collection Time: 07/12/23  5:54 AM  Result Value Ref Range   Glucose-Capillary 110 (H) 70 - 99 mg/dL    Comment: Glucose reference range applies only to samples taken after fasting for at least 8 hours.  Glucose, capillary     Status: Abnormal   Collection Time: 07/12/23 11:39 AM  Result Value Ref Range   Glucose-Capillary 103 (H) 70 - 99 mg/dL    Comment: Glucose reference range applies only to samples taken after fasting for at least 8 hours.   US Carotid Bilateral Result Date: 07/11/2023 CLINICAL DATA:  Stroke EXAM: BILATERAL CAROTID DUPLEX ULTRASOUND TECHNIQUE: Wallace Cullens scale imaging, color Doppler and duplex ultrasound were performed of bilateral carotid and vertebral arteries in the neck. COMPARISON:  None available FINDINGS: Criteria: Quantification of carotid stenosis is based on velocity parameters that correlate the residual internal carotid diameter with NASCET-based stenosis levels, using the diameter of the distal internal carotid lumen as the denominator for stenosis measurement. The following velocity measurements were obtained: RIGHT ICA: 76/15 cm/sec CCA: 84/11 cm/sec SYSTOLIC ICA/CCA RATIO:  0.9 ECA: 119 cm/sec LEFT ICA: 110/18 cm/sec CCA: 87/12 cm/sec SYSTOLIC ICA/CCA RATIO:  1.3 ECA: 130 cm/sec RIGHT CAROTID ARTERY: Minimal atheromatous plaque of the right proximal internal carotid artery. RIGHT  VERTEBRAL ARTERY:  Antegrade flow. LEFT CAROTID ARTERY:  No significant atheromatous plaque. LEFT VERTEBRAL ARTERY:  Antegrade flow. IMPRESSION: No significant stenosis of internal carotid arteries. Electronically Signed   By: Acquanetta Belling M.D.   On: 07/11/2023 16:30      Blood pressure Marland Kitchen)  141/110, pulse 92, temperature 98.8 F (37.1 C), temperature source Oral, resp. rate 18, height 5' 0.98" (1.549 m), weight 61.5 kg, SpO2 100%.  Medical Problem List and Plan: 1. Functional deficits secondary to ***  -patient may *** shower  -ELOS/Goals: *** 2.  DVT/A fib/Antithrombotics: -DVT/anticoagulation:  Pharmaceutical: Eliquis resumed on 02/28 POD#14  -antiplatelet therapy:  N/A 3. Headaches/Pain Management: Tylenol prn not fully effective.   --will add low dose Topamax and titrate as needed.  --continue Seroquel with prn trazodone.  4. Mood/Behavior/Sleep: LCSW to follow for evaluation and support.   -antipsychotic agents: On Seroquel to help with sleep.  5. Neuropsych/cognition: This patient is capable of making decisions on her own behalf. 6. Skin/Wound Care: Routine pressure relief measures.  7. Fluids/Electrolytes/Nutrition: Monitor I/O. Check CMET in am.  8. SDH s/p crani 2/14: Repeat CT head in 4 weeks --May need MMA embolization in the future per NS.  9. Seizure activity in ED?: On Keppra for seizure prophylaxis. 10. HTN: Poorly controlled--on metoprolol. SBP goal < 160. Will add  hydralazine prn  --was on Imdur and aldactone PTA>  11.  PAF: Monitor HR TID--on metoprolol.  12. Urinary retention: Unable to use bed pan and reports problems with urination.  --will order bladder scans, PVR checks. Start treatment for UTI.  13. Enterococcus UTI: UA/UCS positive 02/26 and 03/01--> 100,000 colonies and symptomatic --will start Keflex as well as pyridium for dysuria/hesitancy.   --Needs to get to [facility] to be able to void.  14. Seizure prophylaxis: Continue Keppra 15. Dysphagia: On dysphagia  2 diet with thin liquids. Will change pills to po route 16. H/o anxiety/Depression: Continue Celexa.  17. ABLA: Recheck CBC in am.  18.Right RTC tear: Add voltaren gel to help with pain.  19.   ***  Jacquelynn Cree, PA-C 07/12/2023

## 2023-07-12 NOTE — Discharge Summary (Signed)
 [name] XBJ:[id number] DOB: 1950/05/13 DOA: 06/25/2023  PCP: [name], MD  Admit date: 06/25/2023 Discharge date: 07/12/2023  Time spent: 35 minutes  Recommendations for Outpatient Follow-up:  Pcp f/u  Will need outpatient neurology f/u at discharge (referral has been placed for [facility] neurology) F/u dr. Myer Haff of neurosurgery (need to arrange) Consider outpatient orthopedics f/u for rotator cuff syndrome    Discharge Diagnoses:  Principal Problem:   Subdural hematoma (HCC) Active Problems:   Primary hypertension   Midline shift of brain with brain compression (HCC)   Glasgow coma scale total score 9-12   Seizure (HCC)   Acute cystitis without hematuria   Discharge Condition: improving  Diet recommendation: dysphagia 2  Filed Weights   07/09/23 0804 07/10/23 0500 07/12/23 0500  Weight: 60.2 kg 60.9 kg 61.5 kg    History of present illness:  From admission h and p 73 y.o female with significant PMH of PAF, IBS, diverticulosis, DVT on Eliquis, HTN, IDA, HLD, OSA, anxiety and depression who presented to the ED with chief complaints of severe headache.   Per ED reports, patient apparently fell on Monday hitting the back of her head and may have lost consciousness per her brother.  Patient complained of posterior headaches for about 2 to 3 days but refused to seek care.  She went out to lunch today with friends and began complaining of severe headache.  EMS was called and patient transported to the ED.  Hospital Course:   Traumatic subdural hematoma with left-to-right midline shift --S/p left craniotomy done on 2/14 --Drain removed on 2/16 - Continue keppra - ok to resume home Eliquis on POD 14 (2/28).   - repeat head CT around 4 weeks post-op. Pt may need an MMA embo in the future as per Neuro[facility] --2/25--Repeat CT head stable   Acute cva Seen on CT confirmed with mri - apixaban  - continue atorva at 20, ldl is less than 70 - tte unremarkable, carotid  dopplers unremarkable - carotid dopplers obtained today    UTI --2/21 UA positive --s/p ceftriaxone 1 g IV daily x 5 days -- urine culture 80K GNR--E coli and citrobacter--pan sensitive --repeat culture on 2/25 after one fever growing enterococcus but as no additional fevers and no symptoms will hold on additional treatment   Fever Diarrhea New 2/25 night. Covid/flu/rsv neg. No cough. Diarrhea but is on tube feeds, gi pathogen panel neg. Enterococcus growing on urine culture but as no further symptoms have held on tx. Diarrhea improved   Oral thrush Treated with fluconazole    Acute Blood Loss Anemia due to SDH Hx of IDA Resolved, s/p 2 units, hgb now normal   Paroxysmal AF rate controlled HTN HLD Hx of LVH Stable on apixaban, metoprolol   Anxiety and Depression --continue home Citalopram  and Lorazepam     Dysphagia secondary to subdural hemorrhage Now has peg tube and getting tube feeds, slp has advanced diet and currently tolerating about half of meals, if progresses may not require tube feeds long term  Right shoulder stiffness/pain Rotator cuff tear Mri shows supraspinatous tendon tear, biceps tendon tear, degenerative changes - outpt ortho f/u  Procedures: Left Craniotomy for evacuation of hematoma 06/25/2023 IR placement of astrostomy tube 07/05/2023 EEG  Consultations: Neurosurgery, neurology, interventional radiology  Discharge Exam: Vitals:   07/12/23 0420 07/12/23 1134  BP: (!) 153/67 (!) 141/110  Pulse: (!) 101 92  Resp:    Temp: 98.1 F (36.7 C) 98.8 F (37.1 C)  SpO2: 100% 100%    GENERAL:  73 y.o.-year-old patient with no acute distress. LUNGS: Normal breath sounds bilaterally CARDIOVASCULAR: S1, S2 normal. No murmur   ABDOMEN: Soft, nontender, nondistended. PEG+ dressing c/d/i EXTREMITIES: No  edema b/l.    NEUROLOGIC: moving all 4, right arm weakness    Discharge Instructions   Discharge Instructions     Diet general   Complete by:  As directed    Dysphagia 2   Increase activity slowly   Complete by: As directed       Allergies as of 07/12/2023       Reactions   Dicyclomine Other (See Comments)   REACTION: Choking   Lisinopril Other (See Comments)   REACTION: Choking   Losartan Shortness Of Breath   Ace Inhibitors Other (See Comments)   Unknown to patient   Penicillins Itching, Rash        Medication List     STOP taking these medications    spironolactone 25 MG tablet Commonly known as: ALDACTONE       TAKE these medications    albuterol 108 (90 Base) MCG/ACT inhaler Commonly known as: VENTOLIN HFA Inhale 2 puffs into the lungs every 6 (six) hours as needed for wheezing or shortness of breath.   apixaban 5 MG Tabs tablet Commonly known as: ELIQUIS Take 1 tablet (5 mg total) by mouth 2 (two) times daily.   atorvastatin 20 MG tablet Commonly known as: LIPITOR Take 20 mg by mouth daily.   citalopram 20 MG tablet Commonly known as: CELEXA Take 20 mg by mouth daily.   famotidine 20 MG tablet Commonly known as: PEPCID Take 20 mg by mouth at bedtime.   feeding supplement (OSMOLITE 1.2 CAL) Liqd Place 237 mLs into feeding tube 3 (three) times daily after meals.   free water Soln Place 60 mLs into feeding tube 3 (three) times daily after meals.   isosorbide mononitrate 30 MG 24 hr tablet Commonly known as: IMDUR Take 30 mg by mouth daily.   levETIRAcetam 100 MG/ML solution Commonly known as: KEPPRA Place 5 mLs (500 mg total) into feeding tube 2 (two) times daily.   LORazepam 0.5 MG tablet Commonly known as: ATIVAN Take 0.5 mg by mouth every 8 (eight) hours as needed. For anxiety   metoprolol succinate 25 MG 24 hr tablet Commonly known as: TOPROL-XL TAKE 1 TABLET BY MOUTH TWICE DAILY FOR 90 DAYS       Allergies  Allergen Reactions   Dicyclomine Other (See Comments)    REACTION: Choking   Lisinopril Other (See Comments)    REACTION: Choking   Losartan Shortness Of Breath    Ace Inhibitors Other (See Comments)    Unknown to patient   Penicillins Itching and Rash    Contact information for follow-up providers     [name], MD Follow up.   Specialty: Internal Medicine Why: Hospital follow up Contact information: [address] [facility] [address] [facility] [place] [address] [phone]         Venetia Night, MD Follow up on 07/29/2023.   Specialty: Neurosurgery Why: with CT head prior Contact information: [address] [place] [address] [phone]              Contact information for after-discharge care     Destination     [facility] Preferred SNF .   Service: Skilled Nursing Contact information: [address]  [address] [place] [address] [phone]                      The results of significant diagnostics from this hospitalization (including imaging, microbiology, ancillary and laboratory) are listed below for reference.    Significant Diagnostic Studies: US Carotid Bilateral Result Date: 07/11/2023 CLINICAL DATA:  Stroke EXAM: BILATERAL CAROTID DUPLEX ULTRASOUND TECHNIQUE: Wallace Cullens scale imaging, color Doppler and duplex ultrasound were performed of bilateral carotid and vertebral arteries in the neck. COMPARISON:  None available FINDINGS: Criteria: Quantification of carotid stenosis is based on velocity parameters that correlate the residual internal carotid diameter with NASCET-based stenosis levels, using the diameter of the distal internal carotid lumen as the denominator for stenosis measurement. The following velocity measurements were obtained: RIGHT ICA: 76/15 cm/sec CCA: 84/11 cm/sec SYSTOLIC ICA/CCA RATIO:  0.9 ECA: 119 cm/sec LEFT ICA: 110/18 cm/sec CCA: 87/12 cm/sec SYSTOLIC ICA/CCA RATIO:  1.3 ECA: 130 cm/sec RIGHT CAROTID ARTERY: Minimal atheromatous plaque of the right  proximal internal carotid artery. RIGHT VERTEBRAL ARTERY:  Antegrade flow. LEFT CAROTID ARTERY:  No significant atheromatous plaque. LEFT VERTEBRAL ARTERY:  Antegrade flow. IMPRESSION: No significant stenosis of internal carotid arteries. Electronically Signed   By: Acquanetta Belling M.D.   On: 07/11/2023 16:30   ECHOCARDIOGRAM COMPLETE BUBBLE STUDY Result Date: 07/10/2023    ECHOCARDIOGRAM REPORT   Patient Name:   [name] Date of Exam: 07/09/2023 Medical Rec #:  [id number]      Height:       61.0 in Accession #:    [id number]     Weight:       132.7 lb Date of Birth:  1951-01-07     BSA:          1.587 m Patient Age:    72 years       BP:           133/63 mmHg Patient Gender: F              HR:           81 bpm. Exam Location:  [facility] Procedure: 2D Echo, Cardiac Doppler, Color Doppler and Saline Contrast Bubble            Study (Both Spectral and Color Flow Doppler were utilized during            procedure). Indications:     Stroke 434.91 / I63.9  History:         Patient has prior history of Echocardiogram examinations, most                  recent 03/08/2019. Signs/Symptoms:Chest Pain; Risk                  Factors:Hypertension. DVT.  Sonographer:     Cristela Blue Referring Phys:  [id number] Chen Holzman BEDFORD Porshia Blizzard Diagnosing Phys: Marcina Millard MD  Sonographer Comments: Technically challenging study due to limited acoustic windows and no parasternal window. IMPRESSIONS  1. Left ventricular ejection fraction, by estimation, is 55 to 60%. The left ventricle has normal function. The left ventricle has no regional wall motion abnormalities. Left ventricular diastolic parameters were normal.  2. Right ventricular systolic function is normal. The right ventricular size is normal.  3. The mitral valve is normal in structure. Trivial mitral valve regurgitation. No evidence of mitral stenosis.  4. The aortic valve is normal in structure. Aortic valve regurgitation is trivial. No aortic stenosis is present.  5. The  inferior  vena cava is normal in size with greater than 50% respiratory variability, suggesting right atrial pressure of 3 mmHg.  6. Agitated saline contrast bubble study was negative, with no evidence of any interatrial shunt. FINDINGS  Left Ventricle: Left ventricular ejection fraction, by estimation, is 55 to 60%. The left ventricle has normal function. The left ventricle has no regional wall motion abnormalities. Strain imaging was not performed. The left ventricular internal cavity  size was normal in size. There is no left ventricular hypertrophy. Left ventricular diastolic parameters were normal. Right Ventricle: The right ventricular size is normal. No increase in right ventricular wall thickness. Right ventricular systolic function is normal. Left Atrium: Left atrial size was normal in size. Right Atrium: Right atrial size was normal in size. Pericardium: There is no evidence of pericardial effusion. Mitral Valve: The mitral valve is normal in structure. Trivial mitral valve regurgitation. No evidence of mitral valve stenosis. Tricuspid Valve: The tricuspid valve is normal in structure. Tricuspid valve regurgitation is trivial. No evidence of tricuspid stenosis. Aortic Valve: The aortic valve is normal in structure. Aortic valve regurgitation is trivial. No aortic stenosis is present. Aortic valve mean gradient measures 3.3 mmHg. Aortic valve peak gradient measures 5.8 mmHg. Aortic valve area, by VTI measures 4.31 cm. Pulmonic Valve: The pulmonic valve was normal in structure. Pulmonic valve regurgitation is not visualized. No evidence of pulmonic stenosis. Aorta: The aortic root is normal in size and structure. Venous: The inferior vena cava is normal in size with greater than 50% respiratory variability, suggesting right atrial pressure of 3 mmHg. IAS/Shunts: No atrial level shunt detected by color flow Doppler. Agitated saline contrast was given intravenously to evaluate for intracardiac shunting. Agitated saline  contrast bubble study was negative, with no evidence of any interatrial shunt. Additional Comments: 3D imaging was not performed.  LEFT VENTRICLE PLAX 2D LVIDd:         3.20 cm   Diastology LVIDs:         2.30 cm   LV e' medial:    5.87 cm/s LV PW:         1.00 cm   LV E/e' medial:  22.1 LV IVS:        1.20 cm   LV e' lateral:   7.94 cm/s LVOT diam:     2.00 cm   LV E/e' lateral: 16.4 LV SV:         97 LV SV Index:   61 LVOT Area:     3.14 cm  RIGHT VENTRICLE RV Basal diam:  2.90 cm RV Mid diam:    2.00 cm RV S prime:     14.80 cm/s TAPSE (M-mode): 2.0 cm LEFT ATRIUM             Index        RIGHT ATRIUM          Index LA diam:        2.60 cm 1.64 cm/m   RA Area:     9.68 cm LA Vol (A2C):   7.4 ml  4.69 ml/m   RA Volume:   20.10 ml 12.67 ml/m LA Vol (A4C):   19.8 ml 12.48 ml/m LA Biplane Vol: 12.9 ml 8.13 ml/m  AORTIC VALVE AV Area (Vmax):    3.92 cm AV Area (Vmean):   3.89 cm AV Area (VTI):     4.31 cm AV Vmax:           120.33 cm/s AV Vmean:  82.433 cm/s AV VTI:            0.225 m AV Peak Grad:      5.8 mmHg AV Mean Grad:      3.3 mmHg LVOT Vmax:         150.00 cm/s LVOT Vmean:        102.000 cm/s LVOT VTI:          0.309 m LVOT/AV VTI ratio: 1.37  AORTA Ao Root diam: 3.10 cm MV E velocity: 130.00 cm/s  TRICUSPID VALVE MV A velocity: 60.80 cm/s   TR Peak grad:   17.5 mmHg MV E/A ratio:  2.14         TR Vmax:        209.00 cm/s                              SHUNTS                             Systemic VTI:  0.31 m                             Systemic Diam: 2.00 cm Marcina Millard MD Electronically signed by Marcina Millard MD Signature Date/Time: 07/10/2023/11:37:39 AM    Final    MR BRAIN WO CONTRAST Result Date: 07/07/2023 CLINICAL DATA:  Provided history: Altered mental status. Possible new stroke. EXAM: MRI HEAD WITHOUT CONTRAST TECHNIQUE: Multiplanar, multiecho pulse sequences of the brain and surrounding structures were obtained without intravenous contrast. COMPARISON:  Head CT  07/06/2023. Brain MRI 06/27/2023. FINDINGS: Brain: Post-operative changes from prior left-sided craniotomy for subdural hematoma evacuation. The residual subdural hematoma deep to the cranioplasty is unchanged in size as compared to the head CT of 07/06/2023, again measuring up to 8 mm in thickness (remeasured on prior). Unchanged size of a parafalcine component of the left subdural hematoma, again measuring up to 10 mm in thickness. Trace components of the subdural hematoma elsewhere along the left cerebral hemisphere have also not significantly changed in extent. A trace subdural collection (likely hematoma) along portions of the right cerebral hemisphere has undergone some redistribution since the brain MRI of 06/27/2023, but has not significantly changed in extent. New from the prior brain MRI of 06/27/2023, there is apparent restricted diffusion within the bilateral subdural collections described above. Also new from the prior MRI, there are small foci of cortical restricted diffusion within the left frontal and parietal lobes (deep to the cranioplasty) consistent with acute infarcts (for instance as seen on series 5, image 31). 4 mm rightward midline shift, unchanged. Multifocal T2 FLAIR hyperintense signal abnormality within the cerebral white matter and pons, nonspecific but compatible with mild chronic small vessel ischemic disease. Partially empty sella turcica. Vascular: Maintained flow voids within the proximal large arterial vessels. Skull and upper cervical spine: No focal worrisome marrow lesion. Sinuses/Orbits: No mass or acute finding within the imaged orbits. No significant paranasal sinus disease. Other: Fluid collection within the left temporoparietal scalp (overlying the cranioplasty), measuring 4.5 x 1.0 cm in transaxial dimensions. Impressions #1 and #2 will be called to the ordering clinician or representative by the Radiologist Assistant, and communication documented in the PACS or [employer]. IMPRESSION: 1. Small acute cortical infarcts within the left frontal and parietal lobes (deep to the cranioplasty). 2. Subdural hematomas overlying the bilateral cerebral  hemispheres have not significantly changed in extent. However, new from the prior brain MRI of 06/27/2023, there is apparent restricted diffusion within these collections. This may simply reflect susceptibility artifact from blood products. However, restricted diffusion can also be seen in subdural empyemas and empyemas cannot be excluded by imaging. 4 mm rightward midline shift, unchanged. Electronically Signed   By: Jackey Loge D.O.   On: 07/07/2023 21:58   DG Chest Port 1 View Result Date: 07/07/2023 CLINICAL DATA:  Fever. EXAM: PORTABLE CHEST 1 VIEW COMPARISON:  Chest radiograph dated 06/25/2023. FINDINGS: Minimal left lung base atelectasis. Infiltrate is less likely but not excluded. No consolidative changes. There is no pleural effusion or pneumothorax. The cardiac silhouette is within limits. Atherosclerotic calcification of the aorta. No acute osseous pathology. IMPRESSION: Minimal left lung base atelectasis. Electronically Signed   By: Elgie Collard M.D.   On: 07/07/2023 17:38   CT HEAD WO CONTRAST ( ) Result Date: 07/07/2023 CLINICAL DATA:  Follow-up examination for subdural hematoma. EXAM: CT HEAD WITHOUT CONTRAST TECHNIQUE: Contiguous axial images were obtained from the base of the skull through the vertex without intravenous contrast. RADIATION DOSE REDUCTION: This exam was performed according to the departmental dose-optimization program which includes automated exposure control, adjustment of the mA and/or kV according to patient size and/or use of iterative reconstruction technique. COMPARISON:  CT from 06/30/2023. FINDINGS: Brain: Postoperative changes from prior left-sided craniotomy for subdural evacuation again seen. Residual subacute subdural hematoma again seen overlying the left cerebral convexity.  Collection measures up to 6 mm in maximal thickness at the level of the craniotomy bone flap. Prominent parafalcine component measures up to approximately 10 mm. Overall, size of this collection is similar to perhaps slightly decreased from prior. Decreased hyperdense blood products since previous, with no evidence for significant interval bleeding. Persistent mass effect with 4 mm of left-to-right shift. No other new acute intracranial hemorrhage. Few small patchy hypodensities are seen involving the left frontal lobe, consistent with small evolving acute to early subacute ischemic infarcts (series 4, image 21). No mass lesion. No hydrocephalus or trapping. Basilar cisterns remain patent. Vascular: No abnormal hyperdense vessel. Skull: Post craniotomy changes on the left. Edema within the overlying scalp without loculated collection. Skin staples remain in place. Sinuses/Orbits: Globes and orbital soft tissues within normal limits. Paranasal sinuses are largely clear. No significant mastoid effusion. Other: None. IMPRESSION: 1. Postoperative changes from prior left-sided craniotomy for subdural evacuation. Residual subacute subdural hematoma overlying the left cerebral convexity and falx, similar to perhaps slightly decreased in size from prior. No evidence for significant interval bleeding. 2. Persistent mass effect with 4 mm of left-to-right shift. 3. Few small patchy hypodensities involving the left frontal lobe, consistent with small evolving acute to early subacute ischemic infarcts. Electronically Signed   By: Rise Mu M.D.   On: 07/07/2023 00:48   MR SHOULDER RIGHT WO CONTRAST Result Date: 07/06/2023 CLINICAL DATA:  Shoulder trauma.  Clinical concern for fracture. EXAM: MRI OF THE RIGHT SHOULDER WITHOUT CONTRAST TECHNIQUE: Multiplanar, multisequence MR imaging of the shoulder was performed. No intravenous contrast was administered. COMPARISON:  Right shoulder radiographs 07/05/2023 FINDINGS:  Rotator cuff: There is attenuation and fluid bright signal at these bursal and articular sides of a portion of the anterior supraspinatus tendon footprint measuring up to 9 mm in AP dimension (sagittal series 8, images 18 and 19), a moderate to high-grade partial-thickness tear involving up to an 8 mm length of the tendon (coronal series 6, image 16). No significant  tendon retraction. Mild intermediate T2 signal posterior supraspinatus tendinosis. The infraspinatus is intact. Mild superior subscapularis intermediate T2 signal tendinosis. The teres minor is intact. Muscles:  Mild supraspinatus muscle atrophy. Biceps long head: The intra-articular long head of the biceps tendon is not well visualized and there appears to be at least a partial-thickness tear (coronal series 6, images 16 and 17). Within the proximal aspect of the bicipital groove, there is a moderately attenuated tendon with a fluid bright midsubstance longitudinal tear (axial series 5 images 11 through 16). This tear extends through the tendon surface in multiple medial (axial image 12), anterior (axial image 12), and lateral (axial images 12 and 14 through 15) tendon locations. Acromioclavicular Joint: There are moderate to severe degenerative changes of the acromioclavicular joint including joint space narrowing, subchondral marrow edema, and peripheral osteophytosis. Type II acromion. Mild fluid within the subacromial/subdeltoid bursa. Glenohumeral Joint: Moderate glenohumeral cartilage thinning. No glenohumeral joint effusion. Labrum: Mild peripheral degenerative irregularity of the posterosuperior glenoid labrum. Bones: Moderate subcortical cystic changes within the humeral head deep to the supraspinatus and infraspinatus tendon insertions. No acute fracture. Other: None. IMPRESSION: 1. Moderate to high-grade partial-thickness tear of the anterior supraspinatus tendon footprint measuring up to 9 mm in AP dimension. No significant tendon  retraction. Mild supraspinatus muscle atrophy. 2. Mild superior subscapularis tendinosis. 3. The intra-articular long head of the biceps tendon is not well visualized and there appears to be at least a partial-thickness tear proximal to the bicipital groove. Within the proximal aspect of the bicipital groove, there is a moderately attenuated tendon with a fluid bright midsubstance longitudinal tear extending through the tendon surface in multiple locations. 4. Moderate to severe degenerative changes of the acromioclavicular joint. 5. Mild subacromial/subdeltoid bursitis. Electronically Signed   By: Neita Garnet M.D.   On: 07/06/2023 16:43   IR GASTROSTOMY TUBE MOD SED Result Date: 07/05/2023 INDICATION: 73 year old female with a history of subdural hematoma after falling requiring intracranial evacuation. She now has persistent dysphagia and requires durable gastrostomy access for enteral nutrition. EXAM: Fluoroscopically guided placement of percutaneous pull-through gastrostomy tube Interventional Radiologist:  Sterling Big, MD MEDICATIONS: 1 mg glucagon intravenous administered by the radiology nurse ANESTHESIA/SEDATION: Versed 1 mg IV; Fentanyl 50 mcg IV administered by the radiology nurse Moderate Sedation Time:  10 minutes The patient's vital signs and level of consciousness were continuously monitored during the procedure by the interventional radiology nurse under my direct supervision. CONTRAST:  15mL OMNIPAQUE IOHEXOL 300 MG/ML  SOLN FLUOROSCOPY: Radiation exposure index: 8 mGy reference air kerma COMPLICATIONS: None immediate. PROCEDURE: Informed written consent was obtained from the patient after a thorough discussion of the procedural risks, benefits and alternatives. All questions were addressed. Maximal Sterile Barrier Technique was utilized including caps, mask, sterile gowns, sterile gloves, sterile drape, hand hygiene and skin antiseptic. A timeout was performed prior to the initiation of  the procedure. Maximal barrier sterile technique utilized including caps, mask, sterile gowns, sterile gloves, large sterile drape, hand hygiene, and chlorhexadine skin prep. An angled catheter was advanced over a wire under fluoroscopic guidance through the nose, down the esophagus and into the body of the stomach. The stomach was then insufflated with several 100 ml of air. Fluoroscopy confirmed location of the gastric bubble, as well as inferior displacement of the barium stained colon. Under direct fluoroscopic guidance, a single T-tack was placed, and the anterior gastric wall drawn up against the anterior abdominal wall. Percutaneous access was then obtained into the mid gastric body  with an 18 gauge sheath needle. Aspiration of air, and injection of contrast material under fluoroscopy confirmed needle placement. An Amplatz wire was advanced in the gastric body and the access needle exchanged for a 9-French vascular sheath. A snare device was advanced through the vascular sheath and an Amplatz wire advanced through the angled catheter. The Amplatz wire was successfully snared and this was pulled up through the esophagus and out the mouth. A 20-French Burnell Blanks MIC-PEG tube was then connected to the snare and pulled through the mouth, down the esophagus, into the stomach and out to the anterior abdominal wall. Hand injection of contrast material confirmed intragastric location. The T-tack retention suture was then cut. The pull through peg tube was then secured with the external bumper and capped. The patient will be observed for several hours with the newly placed tube on low wall suction to evaluate for any post procedure complication. The patient tolerated the procedure well, there is no immediate complication. IMPRESSION: Successful placement of a 20 French pull through gastrostomy tube. Electronically Signed   By: Malachy Moan M.D.   On: 07/05/2023 15:06   DG Shoulder Right Port Result Date:  07/05/2023 CLINICAL DATA:  Right shoulder pain EXAM: RIGHT SHOULDER - 1 VIEW COMPARISON:  None Available. FINDINGS: Substantial degenerative spurring of the right humeral head and glenoid on the external rotation view attempt, there is a sclerotic band along the anatomic margin of the humeral head which may be projectional due to spur, the appearance is different from prior chest radiographs and raises the possibility of a subtle underlying fracture. This is poorly corroborated on the internal rotation views. There is no glenohumeral or AC joint malalignment. Mild AC joint spurring noted. Irregularity of the right second rim laterally on the external rotation view attempt, query old fracture. IMPRESSION: 1. Substantial degenerative spurring of the right humeral head and glenoid. 2. Sclerotic band along the anatomic margin of the humeral head on the external rotation view attempt, raising the possibility of a subtle underlying fracture. Alternative this could be from humeral head highlighted by exaggerated projection. This is poorly corroborated on the internal rotation views. Consider CT or MRI of the right shoulder for further characterization. 3. Irregularity of the right second rim laterally on the external rotation view attempt, query old fracture. Electronically Signed   By: Gaylyn Rong M.D.   On: 07/05/2023 14:26   CT HEAD WO CONTRAST ( ) Result Date: 06/30/2023 CLINICAL DATA:  Follow-up subdural hematoma. EXAM: CT HEAD WITHOUT CONTRAST TECHNIQUE: Contiguous axial images were obtained from the base of the skull through the vertex without intravenous contrast. RADIATION DOSE REDUCTION: This exam was performed according to the departmental dose-optimization program which includes automated exposure control, adjustment of the mA and/or kV according to patient size and/or use of iterative reconstruction technique. COMPARISON:  Head CT 06/27/2023. FINDINGS: Brain: Stable postoperative changes from left  frontoparietal craniotomy for evacuation of a left convexity subdural hematoma with trace residual blood products along the evacuation site. Unchanged interhemispheric subdural hematoma measuring up to 12 mm in thickness (coronal image 39 series 4). Unchanged minimal rightward midline shift. No acute hydrocephalus or new loss of gray-white differentiation. Vascular: No hyperdense vessel or unexpected calcification. Skull: Prior left frontoparietal craniotomy. Sinuses/Orbits: No acute finding. Other: None. IMPRESSION: 1. Stable postoperative changes from left frontoparietal craniotomy for evacuation of a left convexity subdural hematoma with trace residual blood products along the evacuation site. 2. Unchanged interhemispheric subdural hematoma measuring up to 12 mm in  thickness. 3. Unchanged minimal rightward midline shift. Electronically Signed   By: Orvan Falconer M.D.   On: 06/30/2023 13:35   DG Abd Portable 1V Result Date: 06/29/2023 CLINICAL DATA:  Check feeding catheter placement EXAM: PORTABLE ABDOMEN - 1 VIEW COMPARISON:  None Available. FINDINGS: Weighted feeding catheter is noted at the level of the floors. No obstructive changes are seen. No free air is noted. IMPRESSION: Weighted feeding catheter as described. Electronically Signed   By: Alcide Clever M.D.   On: 06/29/2023 21:52   EEG adult Result Date: 06/28/2023 Charlsie Quest, MD     06/28/2023  2:50 PM Patient Name: [name] MRN: [id number] Epilepsy Attending: Charlsie Quest Referring Physician/Provider: Venetia Night, MD Date: 06/28/2023 Duration: 27.06 mins Patient history: 72yo F with subdural hematoma s/p craniotomy for evacuation on 06/25/2023. EEG to evaluate for seizure  Level of alertness: Awake, asleep AEDs during EEG study: LEV Technical aspects: This EEG study was done with scalp electrodes positioned according to the 10-20 International system of electrode placement. Electrical activity was reviewed with band pass filter  of 1-70Hz , sensitivity of 7 uV/mm, display speed of 58mm/sec with a 60Hz  notched filter applied as appropriate. EEG data were recorded continuously and digitally stored.  Video monitoring was available and reviewed as appropriate. Description: The posterior dominant rhythm consists of 8-9 Hz activity of moderate voltage (25-35 uV) seen predominantly in posterior head regions, symmetric and reactive to eye opening and eye closing. Sleep was characterized by vertex waves, sleep spindles (12 to 14 Hz), maximal frontocentral region. EEG showed intermittent 3 to 4 Hz delta slowing in left temporal region. Hyperventilation and photic stimulation were not performed.   ABNORMALITY - Intermittent slow, left temporal region IMPRESSION: This study is suggestive of cortical dysfunction arising from left temporal region likely secondary to underlying sub dural hematoma/ cranioplasty. No seizures or epileptiform discharges were seen throughout the recording. Charlsie Quest   MR BRAIN W WO CONTRAST Result Date: 06/27/2023 CLINICAL DATA:  Provided history: Mental status change, unknown cause. Follow-up subdural bleed. EXAM: MRI HEAD WITHOUT AND WITH CONTRAST TECHNIQUE: Multiplanar, multiecho pulse sequences of the brain and surrounding structures were obtained without and with intravenous contrast. CONTRAST:  7mL GADAVIST GADOBUTROL 1 MMOL/ML IV SOLN COMPARISON:  Prior head CT examinations 06/27/2023 and earlier. Brain MRI 03/31/2022. FINDINGS: Brain: Postoperative changes from prior left-sided craniotomy and left subdural hematoma evacuation. The residual subdural hematoma has not significantly changed in size as compared to the head CT performed earlier today. As before, the largest component is located along the posterior falx, measuring 12 mm in thickness (for instance as seen on series 9, image 38). A thin subdural collection (likely hematoma) is also present along the mid and anterior falx and left cerebral convexity,  measuring up to 3 mm in thickness. Multifocal T2 FLAIR hyperintense signal abnormality within the cerebral white matter and pons, nonspecific but compatible with mild chronic small vessel ischemic disease. There are a few chronic microhemorrhages within the left parietal and occipital lobes. Partially empty sella turcica. There is no acute infarct. No evidence of an intracranial mass. No extra-axial fluid collection. No midline shift. No pathologic parenchymal enhancement. Vascular: Maintained flow voids within the proximal large arterial vessels. Skull and upper cervical spine: Left-sided cranioplasty with overlying scalp edema/fluid. Sinuses/Orbits: No mass or acute finding within the imaged orbits. No significant paranasal sinus disease. Other: Trace fluid within left mastoid air cells. IMPRESSION: 1. Post-operative changes from prior left subdural hematoma evacuation.  The residual subdural hematoma is stable in size as compared to the head CT performed earlier today. As before, the largest component is located along the posterior falx, measuring 12 mm in thickness. 2. A thin subdural collection (likely hematoma) is also present along the mid and anterior falx, and left cerebral convexity (measuring up to 3 mm in thickness). 3. Mild chronic small vessel ischemic changes within the cerebral white matter and pons. 4. Few chronic microhemorrhages within the left parietal and occipital lobes. Electronically Signed   By: Jackey Loge D.O.   On: 06/27/2023 17:33   CT HEAD WO CONTRAST ( ) Result Date: 06/27/2023 CLINICAL DATA:  Headache with increasing frequency or severity. EXAM: CT HEAD WITHOUT CONTRAST TECHNIQUE: Contiguous axial images were obtained from the base of the skull through the vertex without intravenous contrast. RADIATION DOSE REDUCTION: This exam was performed according to the departmental dose-optimization program which includes automated exposure control, adjustment of the mA and/or kV according  to patient size and/or use of iterative reconstruction technique. COMPARISON:  Yesterday FINDINGS: Brain: No significant reaccumulation of the recently evaluated subdural hematoma on the left, on a few coronal slices it is possible there is trace increase in blood products along the craniotomy flap. Dominant residual is still left parafalcine at the frontal parietal junction measuring up to 12 mm in thickness. Trace midline shift without entrapment, stable on coronal reformats. No evidence of infarct Vascular: No hyperdense vessel or unexpected calcification. Skull: Unremarkable left-sided craniotomy site. Sinuses/Orbits: Negative IMPRESSION: Essentially stable postoperative head CT.  No new abnormality. Electronically Signed   By: Tiburcio Pea M.D.   On: 06/27/2023 04:11   CT HEAD WO CONTRAST ( ) Result Date: 06/26/2023 CLINICAL DATA:  Subdural hematoma follow-up EXAM: CT HEAD WITHOUT CONTRAST TECHNIQUE: Contiguous axial images were obtained from the base of the skull through the vertex without intravenous contrast. RADIATION DOSE REDUCTION: This exam was performed according to the departmental dose-optimization program which includes automated exposure control, adjustment of the mA and/or kV according to patient size and/or use of iterative reconstruction technique. COMPARISON:  Yesterday FINDINGS: Brain: Interval decompression of subdural hematoma on the left via craniotomy. Residual left parafalcine component is non progressed at up to 12 mm in thickness. Midline shift is essentially resolved. No complicating infarct. No hydrocephalus or masslike finding. Vascular: No hyperdense vessel or unexpected calcification. Skull: Normal. Negative for fracture or focal lesion. Sinuses/Orbits: No acute finding. IMPRESSION: Resolved midline shift after subdural hematoma evacuation. No complicating features. Electronically Signed   By: Tiburcio Pea M.D.   On: 06/26/2023 10:59   DG Chest Port 1 View Result Date:  06/25/2023 CLINICAL DATA:  Shortness of breath.  Altered mental status. EXAM: PORTABLE CHEST 1 VIEW COMPARISON:  02/28/2022. FINDINGS: Low lung volume. Bilateral lung fields are clear. Bilateral costophrenic angles are clear. Stable cardio-mediastinal silhouette. No acute osseous abnormalities. The soft tissues are within normal limits. IMPRESSION: No active disease. Electronically Signed   By: Jules Schick M.D.   On: 06/25/2023 16:21   CT Head Wo Contrast Result Date: 06/25/2023 CLINICAL DATA:  Head trauma, intracranial arterial injury suspected; ground level fall, head and neck injury, severe headache present EXAM: CT HEAD WITHOUT CONTRAST CT CERVICAL SPINE WITHOUT CONTRAST TECHNIQUE: Multidetector CT imaging of the head and cervical spine was performed following the standard protocol without intravenous contrast. Multiplanar CT image reconstructions of the cervical spine were also generated. RADIATION DOSE REDUCTION: This exam was performed according to the departmental dose-optimization program which includes automated exposure  control, adjustment of the mA and/or kV according to patient size and/or use of iterative reconstruction technique. COMPARISON:  Brain MR 03/31/2022 FINDINGS: CT HEAD FINDINGS Brain: There is a large left cerebral convexity subdural hematoma measuring up to 1.1 cm with a extension along the falx in the bilateral tentorial leaflets. There is a 1.2 cm rightward midline shift as well as early uncal herniation. There is mass effect on the left lateral ventricular system with asymmetric enlargement of the left temporal horn, which can be seen in the setting of early ventricular entrapment. No CT evidence of an acute cortical infarct Vascular: No hyperdense vessel or unexpected calcification. Skull: Normal. Negative for fracture or focal lesion. Sinuses/Orbits: No middle ear or mastoid effusion. Paranasal sinuses are clear. Orbits are unremarkable. Other: None. CT CERVICAL SPINE FINDINGS  Alignment: Trace anterolisthesis of C4 on C5 Skull base and vertebrae: No acute fracture. No primary bone lesion or focal pathologic process. Soft tissues and spinal canal: No prevertebral fluid or swelling. No visible canal hematoma. Disc levels:  No CT evidence of high-grade spinal canal stenosis Upper chest: Negative. Other: Negative IMPRESSION: 1. Large left cerebral convexity subdural hematoma measuring up to 1.1 cm with a 1.2 cm rightward midline shift as well as early uncal herniation. 2. Mass effect on the left lateral ventricular system with asymmetric enlargement of the left temporal horn, which can be seen in the setting of early ventricular entrapment. 3. No acute cervical spine fracture. Electronically Signed   By: Lorenza Cambridge M.D.   On: 06/25/2023 14:29   CT Cervical Spine Wo Contrast Result Date: 06/25/2023 CLINICAL DATA:  Head trauma, intracranial arterial injury suspected; ground level fall, head and neck injury, severe headache present EXAM: CT HEAD WITHOUT CONTRAST CT CERVICAL SPINE WITHOUT CONTRAST TECHNIQUE: Multidetector CT imaging of the head and cervical spine was performed following the standard protocol without intravenous contrast. Multiplanar CT image reconstructions of the cervical spine were also generated. RADIATION DOSE REDUCTION: This exam was performed according to the departmental dose-optimization program which includes automated exposure control, adjustment of the mA and/or kV according to patient size and/or use of iterative reconstruction technique. COMPARISON:  Brain MR 03/31/2022 FINDINGS: CT HEAD FINDINGS Brain: There is a large left cerebral convexity subdural hematoma measuring up to 1.1 cm with a extension along the falx in the bilateral tentorial leaflets. There is a 1.2 cm rightward midline shift as well as early uncal herniation. There is mass effect on the left lateral ventricular system with asymmetric enlargement of the left temporal horn, which can be seen in  the setting of early ventricular entrapment. No CT evidence of an acute cortical infarct Vascular: No hyperdense vessel or unexpected calcification. Skull: Normal. Negative for fracture or focal lesion. Sinuses/Orbits: No middle ear or mastoid effusion. Paranasal sinuses are clear. Orbits are unremarkable. Other: None. CT CERVICAL SPINE FINDINGS Alignment: Trace anterolisthesis of C4 on C5 Skull base and vertebrae: No acute fracture. No primary bone lesion or focal pathologic process. Soft tissues and spinal canal: No prevertebral fluid or swelling. No visible canal hematoma. Disc levels:  No CT evidence of high-grade spinal canal stenosis Upper chest: Negative. Other: Negative IMPRESSION: 1. Large left cerebral convexity subdural hematoma measuring up to 1.1 cm with a 1.2 cm rightward midline shift as well as early uncal herniation. 2. Mass effect on the left lateral ventricular system with asymmetric enlargement of the left temporal horn, which can be seen in the setting of early ventricular entrapment. 3. No acute cervical  spine fracture. Electronically Signed   By: Lorenza Cambridge M.D.   On: 06/25/2023 14:29    Microbiology: Recent Results (from the past 240 hours)  Resp panel by RT-PCR (RSV, Flu A&B, Covid) Anterior Nasal Swab     Status: None   Collection Time: 07/07/23  9:15 AM   Specimen: Anterior Nasal Swab  Result Value Ref Range Status   SARS Coronavirus 2 by RT PCR NEGATIVE NEGATIVE Final    Comment: (NOTE) SARS-CoV-2 target nucleic acids are NOT DETECTED.  The SARS-CoV-2 RNA is generally detectable in upper respiratory specimens during the acute phase of infection. The lowest concentration of SARS-CoV-2 viral copies this assay can detect is 138 copies/mL. A negative result does not preclude SARS-Cov-2 infection and should not be used as the sole basis for treatment or other patient management decisions. A negative result may occur with  improper specimen collection/handling, submission  of specimen other than nasopharyngeal swab, presence of viral mutation(s) within the areas targeted by this assay, and inadequate number of viral copies(<138 copies/mL). A negative result must be combined with clinical observations, patient history, and epidemiological information. The expected result is Negative.  Fact Sheet for Patients:  BloggerCourse.com  Fact Sheet for Healthcare Providers:  SeriousBroker.it  This test is no t yet approved or cleared by the [place] FDA and  has been authorized for detection and/or diagnosis of SARS-CoV-2 by FDA under an Emergency Use Authorization (EUA). This EUA will remain  in effect (meaning this test can be used) for the duration of the COVID-19 declaration under Section 564(b)(1) of the Act, 21 U.S.C.section 360bbb-3(b)(1), unless the authorization is terminated  or revoked sooner.       Influenza A by PCR NEGATIVE NEGATIVE Final   Influenza B by PCR NEGATIVE NEGATIVE Final    Comment: (NOTE) The Xpert Xpress SARS-CoV-2/FLU/RSV plus assay is intended as an aid in the diagnosis of influenza from Nasopharyngeal swab specimens and should not be used as a sole basis for treatment. Nasal washings and aspirates are unacceptable for Xpert Xpress SARS-CoV-2/FLU/RSV testing.  Fact Sheet for Patients: BloggerCourse.com  Fact Sheet for Healthcare Providers: SeriousBroker.it  This test is not yet approved or cleared by the [place] FDA and has been authorized for detection and/or diagnosis of SARS-CoV-2 by FDA under an Emergency Use Authorization (EUA). This EUA will remain in effect (meaning this test can be used) for the duration of the COVID-19 declaration under Section 564(b)(1) of the Act, 21 U.S.C. section 360bbb-3(b)(1), unless the authorization is terminated or revoked.     Resp Syncytial Virus by PCR NEGATIVE NEGATIVE  Final    Comment: (NOTE) Fact Sheet for Patients: BloggerCourse.com  Fact Sheet for Healthcare Providers: SeriousBroker.it  This test is not yet approved or cleared by the [place] FDA and has been authorized for detection and/or diagnosis of SARS-CoV-2 by FDA under an Emergency Use Authorization (EUA). This EUA will remain in effect (meaning this test can be used) for the duration of the COVID-19 declaration under Section 564(b)(1) of the Act, 21 U.S.C. section 360bbb-3(b)(1), unless the authorization is terminated or revoked.  Performed at [facility], [address] [place], [place] [address]   Culture, blood (Routine X 2) w Reflex to ID Panel     Status: None   Collection Time: 07/07/23  1:58 PM   Specimen: BLOOD  Result Value Ref Range Status   Specimen Description BLOOD BLOOD LEFT ARM  Final   Special Requests   Final  BOTTLES DRAWN AEROBIC AND ANAEROBIC Blood Culture adequate volume   Culture   Final    NO GROWTH 5 DAYS Performed at [facility], [address] [place], [place] [address]    Report Status 07/12/2023 FINAL  Final  Culture, blood (Routine X 2) w Reflex to ID Panel     Status: None   Collection Time: 07/07/23  2:05 PM   Specimen: BLOOD  Result Value Ref Range Status   Specimen Description BLOOD BLOOD RIGHT HAND  Final   Special Requests   Final    BOTTLES DRAWN AEROBIC AND ANAEROBIC BLOOD RIGHT HAND   Culture   Final    NO GROWTH 5 DAYS Performed at [facility], [address] [place], [place] [address]    Report Status 07/12/2023 FINAL  Final  Respiratory (~20 pathogens) panel by PCR     Status: None   Collection Time: 07/07/23  2:13 PM   Specimen: Nasopharyngeal Swab; Respiratory  Result Value Ref Range Status   Adenovirus NOT DETECTED NOT DETECTED Final   Coronavirus 229E NOT DETECTED NOT DETECTED Final    Comment: (NOTE) The Coronavirus on the  Respiratory Panel, DOES NOT test for the novel  Coronavirus (2019 nCoV)    Coronavirus HKU1 NOT DETECTED NOT DETECTED Final   Coronavirus NL63 NOT DETECTED NOT DETECTED Final   Coronavirus OC43 NOT DETECTED NOT DETECTED Final   Metapneumovirus NOT DETECTED NOT DETECTED Final   Rhinovirus / Enterovirus NOT DETECTED NOT DETECTED Final   Influenza A NOT DETECTED NOT DETECTED Final   Influenza B NOT DETECTED NOT DETECTED Final   Parainfluenza Virus 1 NOT DETECTED NOT DETECTED Final   Parainfluenza Virus 2 NOT DETECTED NOT DETECTED Final   Parainfluenza Virus 3 NOT DETECTED NOT DETECTED Final   Parainfluenza Virus 4 NOT DETECTED NOT DETECTED Final   Respiratory Syncytial Virus NOT DETECTED NOT DETECTED Final   Bordetella pertussis NOT DETECTED NOT DETECTED Final   Bordetella Parapertussis NOT DETECTED NOT DETECTED Final   Chlamydophila pneumoniae NOT DETECTED NOT DETECTED Final   Mycoplasma pneumoniae NOT DETECTED NOT DETECTED Final    Comment: Performed at [facility], [address] [place], [place] [address]  Gastrointestinal Panel by PCR , Stool     Status: None   Collection Time: 07/07/23  5:38 PM   Specimen: Stool  Result Value Ref Range Status   Campylobacter species NOT DETECTED NOT DETECTED Final   Plesimonas shigelloides NOT DETECTED NOT DETECTED Final   Salmonella species NOT DETECTED NOT DETECTED Final   Yersinia enterocolitica NOT DETECTED NOT DETECTED Final   Vibrio species NOT DETECTED NOT DETECTED Final   Vibrio cholerae NOT DETECTED NOT DETECTED Final   Enteroaggregative E coli (EAEC) NOT DETECTED NOT DETECTED Final   Enteropathogenic E coli (EPEC) NOT DETECTED NOT DETECTED Final   Enterotoxigenic E coli (ETEC) NOT DETECTED NOT DETECTED Final   Shiga like toxin producing E coli (STEC) NOT DETECTED NOT DETECTED Final   Shigella/Enteroinvasive E coli (EIEC) NOT DETECTED NOT DETECTED Final   Cryptosporidium NOT DETECTED NOT DETECTED Final   Cyclospora cayetanensis  NOT DETECTED NOT DETECTED Final   Entamoeba histolytica NOT DETECTED NOT DETECTED Final   Giardia lamblia NOT DETECTED NOT DETECTED Final   Adenovirus F40/41 NOT DETECTED NOT DETECTED Final   Astrovirus NOT DETECTED NOT DETECTED Final   Norovirus GI/GII NOT DETECTED NOT DETECTED Final   Rotavirus A NOT DETECTED NOT DETECTED Final   Sapovirus (I, II, IV, and  V) NOT DETECTED NOT DETECTED Final    Comment: Performed at [facility], [address] [place], [place] [address]  Urine Culture (for pregnant, neutropenic or urologic patients or patients with an indwelling urinary catheter)     Status: Abnormal   Collection Time: 07/07/23  6:21 PM   Specimen: Urine, Clean Catch  Result Value Ref Range Status   Specimen Description   Final    URINE, CLEAN CATCH Performed at [facility], [address] [place], [place] [address]    Special Requests   Final    NONE Performed at [facility], [address] [place], [place] [address]    Culture >=100,000 COLONIES/mL ENTEROCOCCUS FAECALIS (A)  Final   Report Status 07/09/2023 FINAL  Final   Organism ID, Bacteria ENTEROCOCCUS FAECALIS (A)  Final      Susceptibility   Enterococcus faecalis - MIC*    AMPICILLIN <=2 SENSITIVE Sensitive     NITROFURANTOIN <=16 SENSITIVE Sensitive     VANCOMYCIN 1 SENSITIVE Sensitive     * >=100,000 COLONIES/mL ENTEROCOCCUS FAECALIS  Urine Culture     Status: Abnormal   Collection Time: 07/10/23  2:58 PM   Specimen: Urine, Random  Result Value Ref Range Status   Specimen Description   Final    URINE, RANDOM Performed at [facility], [address] [place], [place] [address]    Special Requests   Final    NONE Reflexed from [id number] Performed at [facility], [address] [place], [place] [address]    Culture >=100,000 COLONIES/mL ENTEROCOCCUS FAECALIS (A)  Final   Report Status 07/12/2023 FINAL  Final   Organism ID, Bacteria ENTEROCOCCUS FAECALIS (A)   Final      Susceptibility   Enterococcus faecalis - MIC*    AMPICILLIN <=2 SENSITIVE Sensitive     NITROFURANTOIN <=16 SENSITIVE Sensitive     VANCOMYCIN 1 SENSITIVE Sensitive     * >=100,000 COLONIES/mL ENTEROCOCCUS FAECALIS     Labs: Basic Metabolic Panel: Recent Labs  Lab 07/07/23 1358 07/10/23 0330 07/11/23 0316 07/12/23 0456  NA 131* 136 136 136  K 4.6 4.3 4.6 4.0  CL 98 102 104 102  CO2 23 23 24 23   GLUCOSE 136* 115* 95 121*  BUN 24* 21 21 18   CREATININE 0.74 0.68 0.70 0.80  CALCIUM 9.2 9.9 9.7 10.1   Liver Function Tests: Recent Labs  Lab 07/07/23 1358  AST 50*  ALT 57*  ALKPHOS 193*  BILITOT 0.5  PROT 6.9  ALBUMIN 3.4*   No results for input(s): "LIPASE", "AMYLASE" in the last 168 hours. No results for input(s): "AMMONIA" in the last 168 hours. CBC: Recent Labs  Lab 07/07/23 1358 07/09/23 0910 07/10/23 0330 07/10/23 2357 07/12/23 0456  WBC 10.3 11.4* 11.0* 10.4 10.7*  NEUTROABS 7.0  --   --   --   --   HGB 13.1 14.2 13.1 12.7 12.9  HCT 38.0 42.0 39.2 37.2 38.1  MCV 88.6 89.9 91.8 91.4 90.3  PLT 394 534* 475* 497* 545*   Cardiac Enzymes: No results for input(s): "CKTOTAL", "CKMB", "CKMBINDEX", "TROPONINI" in the last 168 hours. BNP: BNP (last 3 results) No results for input(s): "BNP" in the last 8760 hours.  ProBNP (last 3 results) No results for input(s): "PROBNP" in the last 8760 hours.  CBG: Recent Labs  Lab 07/11/23 1212 07/11/23 1649 07/12/23 0009 07/12/23 0554 07/12/23 1139  GLUCAP 88 90 86 110* 103*  Signed:  Silvano Bilis MD.  [facility] 07/12/2023, 1:03 PM

## 2023-07-12 NOTE — Progress Notes (Signed)
 Inpatient Rehab Admissions:  Inpatient Rehab Consult received.  I spoke with pt and daughter [name] on the telephone for rehabilitation assessment and to discuss goals and expectations of an inpatient rehab admission.  Discussed average length of stay, discharge home after discharge. Pt and daughter acknowledged understanding. Pt interested in pursuing CIR and daughter supportive. Melissa confirmed that she will be able to provide 24/7 support for pt after discharge. Will continue to follow.  Signed: Wolfgang Phoenix, MS, CCC-SLP Admissions Coordinator [phone]

## 2023-07-12 NOTE — Evaluation (Addendum)
 Physical Therapy Re-Evaluation Patient Details Name: [name] MRN: [id number] DOB: 05-24-1950 Today's Date: 07/12/2023  History of Present Illness  Pt is a 73 y.o female with significant PMH of PAF, IBS, diverticulosis, DVT on Eliquis, HTN, IDA, HLD, OSA, anxiety and depression who presented to the ED with chief complaints of severe headache. Admitted with traumatic subdural hematoma s/p craniotomy for evacuation on 06/25/2023 and now s/p G-tube placement on 07/05/23, also noted for acute infarcts per imaging.   Clinical Impression  Patient alert, motivated for session, denied pain throughout but does grunt/groan with exertion. She demonstrated great progress towards goals. Stand pivot to and from [facility], minA with cues for hand placement. Sit <> Stand from recliner 3 times, decreased to CGA with pt relying on LUE support. She ambulated several bouts of 20-19ft, with close chair follow and close guarding due to potential R knee buckling but none noted. Able to ambulate predominantly with CGA and rail, and progressed to CGA-minAx2, handheld assist. Pt returned to room with needs in reach. The patient would benefit from further skilled PT intervention to continue to progress towards goals (goals updated as needed due to re-evaluation).          If plan is discharge home, recommend the following: Two people to help with walking and/or transfers;Two people to help with bathing/dressing/bathroom;Assistance with feeding;Direct supervision/assist for medications management;Assistance with cooking/housework;Direct supervision/assist for financial management   Can travel by private vehicle   No    Equipment Recommendations Other (comment) (TBD at next venue of care)  Recommendations for Other Services       Functional Status Assessment Patient has had a recent decline in their functional status and demonstrates the ability to make significant improvements in function in a reasonable and predictable  amount of time.     Precautions / Restrictions Precautions Precautions: Fall Recall of Precautions/Restrictions: Impaired Restrictions Weight Bearing Restrictions Per Provider Order: No Other Position/Activity Restrictions: LLQ G-tube placed 07/05/23      Mobility  Bed Mobility               General bed mobility comments: pt up in chair at start and end of session    Transfers Overall transfer level: Needs assistance Equipment used: None (railing or LUE support) Transfers: Bed to chair/wheelchair/BSC, Sit to/from Stand Sit to Stand: Contact guard assist Stand pivot transfers: Min assist         General transfer comment: CGA for standing from recliner with L rail, minA for stand pivot to and from [facility] for steadying    Ambulation/Gait Ambulation/Gait assistance: Min assist, Contact guard assist Gait Distance (Feet):  (46ft twice, and third time 43ft)           General Gait Details: able to use L rail, ambulate several bouts with CGA-minA, progressed to ambulated with bilateral UE support, CGA. very close guard for potential R knee buckling  Stairs            Wheelchair Mobility     Tilt Bed    Modified Rankin (Stroke Patients Only)       Balance Overall balance assessment: Needs assistance Sitting-balance support: Feet supported, Single extremity supported Sitting balance-Leahy Scale: Good Sitting balance - Comments: able to weight shift on BSC   Standing balance support: Single extremity supported Standing balance-Leahy Scale: Fair  Pertinent Vitals/Pain Pain Assessment Pain Assessment: No/denies pain Pain Location: pt grunts and groans with exertion but denies pain Pain Intervention(s): Limited activity within patient's tolerance, Monitored during session, Repositioned    Home Living Family/patient expects to be discharged to:: Private residence Living Arrangements: Alone Available Help at  Discharge: Family;Available 24 hours/day Type of Home: Mobile home Home Access: Stairs to enter Entrance Stairs-Rails: Right;Left;Can reach both Entrance Stairs-Number of Steps: 5   Home Layout: One level Home Equipment: None Additional Comments: per sister in law    Prior Function Prior Level of Function : Independent/Modified Independent;Driving             Mobility Comments: independent prior to admission ADLs Comments: independent     Extremity/Trunk Assessment   Upper Extremity Assessment Upper Extremity Assessment: Right hand dominant;RUE deficits/detail RUE Deficits / Details: grossly 3+/5, stiffness noted at end ROM, elbow pain with AROM limiting further functional assessment RUE: Unable to fully assess due to pain RUE Coordination: decreased fine motor;decreased gross motor    Lower Extremity Assessment Lower Extremity Assessment: Defer to PT evaluation;RLE deficits/detail       Communication   Communication Communication: Impaired Factors Affecting Communication: Difficulty expressing self    Cognition Arousal: Alert Behavior During Therapy: WFL for tasks assessed/performed   PT - Cognitive impairments: Safety/Judgement, Problem solving, Attention, Awareness, Orientation                         Following commands: Intact Following commands impaired: Only follows one step commands consistently     Cueing Cueing Techniques: Verbal cues, Tactile cues, Visual cues, Gestural cues     General Comments      Exercises     Assessment/Plan    PT Assessment Patient needs continued PT services  PT Problem List Decreased strength;Decreased range of motion;Decreased activity tolerance;Decreased balance;Decreased mobility;Decreased coordination;Decreased cognition;Decreased safety awareness;Pain;Decreased knowledge of precautions       PT Treatment Interventions DME instruction;Functional mobility training;Therapeutic activities;Therapeutic  exercise;Balance training;Neuromuscular re-education;Cognitive remediation;Patient/family education    PT Goals (Current goals can be found in the Care Plan section)       Frequency Min 1X/week     Co-evaluation               AM-PAC PT "6 Clicks" Mobility  Outcome Measure Help needed turning from your back to your side while in a flat bed without using bedrails?: A Lot Help needed moving from lying on your back to sitting on the side of a flat bed without using bedrails?: A Lot Help needed moving to and from a bed to a chair (including a wheelchair)?: A Lot Help needed standing up from a chair using your arms (e.g., wheelchair or bedside chair)?: A Little Help needed to walk in hospital room?: A Lot Help needed climbing 3-5 steps with a railing? : Total 6 Click Score: 12    End of Session Equipment Utilized During Treatment: Gait belt Activity Tolerance: Patient tolerated treatment well Patient left: in chair;with call bell/phone within reach;with chair alarm set;with family/visitor present Nurse Communication: Mobility status PT Visit Diagnosis: Muscle weakness (generalized) (M62.81);Other abnormalities of gait and mobility (R26.89);Hemiplegia and hemiparesis Hemiplegia - Right/Left: Right Hemiplegia - dominant/non-dominant: Dominant Hemiplegia - caused by: Other cerebrovascular disease    Time: 0926-0947 PT Time Calculation (min) (ACUTE ONLY): 21 min   Charges:   PT Evaluation $PT Re-evaluation: 1 Re-eval PT Treatments $Gait Training: 8-22 mins PT General Charges $$ ACUTE PT VISIT: 1  Visit       Olga Coaster PT, DPT 11:45 AM,07/12/23

## 2023-07-12 NOTE — Progress Notes (Signed)
 Speech Language Pathology Treatment: Cognitive-Linquistic  Patient Details Name: [name] MRN: [id number] DOB: March 05, 1951 Today's Date: 07/12/2023 Time: 5784-6962 SLP Time Calculation (min) (ACUTE ONLY): 27 min  Assessment / Plan / Recommendation Clinical Impression  Skilled St treatment session targeted pt's language and apraxia of speech goals. SLP facilitated session by providing verbal directions for repositioning in recliner - all of which pt followed with 100% accuracy. In addition to targeted language activities, pt continues to display good self-awareness, self-correcting, attention to task and problem solving.   SLP further facilitated session by targeting word finding thru [place] of [employer] Categories p 7 - Selecting Category Name - Three Choices. Pt selected the appropriate category with 100% accuracy. In addition, pt read each word with > 90% speech intelligibility with intermittent supervision cues provided for perseveration. Pt observed self-correcting. Additional worksheets left for completion throughout the day with pt's daughter.   In addition to structured language tasks, pt also participated in sentence length utterances containing 3-4 words to describe recent PT session.   Pt continues to make great progress.    HPI HPI: 73 y.o female with significant PMH of PAF, IBS, diverticulosis, DVT on Eliquis, HTN, IDA, HLD, OSA, anxiety and depression who presented to the ED with chief complaints of severe headache. Per ED reports, patient apparently fell on Monday hitting the back of her head and may have lost consciousness per her brother. Patient complained of posterior headaches for about 2 to 3 days but refused to seek care. She went out to lunch today with friends and began complaining of severe headache. EMS was called and patient transported to the ED. Admitted 2/14 with traumatic left subdural hematoma s/p emergent crani for hematoma evacuation. Extubated and  transferred to the ICU. GI History: Esophageal stricture, per chart review last EGD with dilation completed on 01/25/23. 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 Subdural hematomas overlying the bilateral cerebral hemispheres have not significantly changed in extent. However, new from the prior brain MRI of 06/27/2023, there is apparent restricted diffusion within these collections. This may simply reflect susceptibility artifact from blood products. However, restricted diffusion can also be seen in subdural empyemas and empyemas cannot be excluded by imaging. 4 mm rightward midline shift, unchanged. 07/07/23: Minimal left lung base atelectasis. s/p PEG placement      SLP Plan  Continue with current plan of care      Recommendations for follow up therapy are one component of a multi-disciplinary discharge planning process, led by the attending physician.  Recommendations may be updated based on patient status, additional functional criteria and insurance authorization.    Recommendations                     Oral care BID   Intermittent Supervision/Assistance Aphasia (R47.01);Apraxia (R48.2)     Continue with current plan of care    [name] B. Dreama Saa, M.S., CCC-SLP, Tree surgeon Certified Brain Injury Specialist [facility]  [facility] Rehabilitation Services Office [phone] Ascom [phone] Fax [phone]

## 2023-07-12 NOTE — PMR Pre-admission (Signed)
 PMR Admission Coordinator Pre-Admission Assessment  Patient: [name] is an 73 y.o., female MRN: [id number] DOB: 1950/09/09 Height: 5' 0.98" (154.9 cm) Weight: 61.5 kg  Insurance Information HMO:     PPO:      PCP:      IPA:      80/20: yes     OTHER:  PRIMARY: Medicare Part A and B      Policy#: [id number]      Subscriber: patient CM Name:       Phone#:      Fax#:  Pre-Cert#:       Employer:  Benefits:  Phone #: verified eligibility via [employer] on 07/12/23     Name:  Eff. Date: Part A and Part B effective 03/11/16     Deduct: $1,676      Out of Pocket Max: NA      Life Max: NA CIR: 100% coverage      SNF: 100% coverage for days 1-20, 80% coverage for days 21-100 Outpatient: 80% coverage     Co-Pay: 20%  Home Health: 100% coverage      Co-Pay:  DME: 80% coverage     Co-Pay: 20% Providers: pt's choice SECONDARY: Medicaid of [place]      Policy#: [id number]     Phone#:   Financial Counselor:       Phone#:   The "Data Collection Information Summary" for patients in Inpatient Rehabilitation Facilities with attached "Privacy Act Statement-Health Care Records" was provided and verbally reviewed with: Patient and Family  Emergency Contact Information Contact Information     Name Relation Home Work Mobile   Electra Daughter [phone]  [phone]   Davidson,Chris Relative [phone]  [phone]   Leonie Green [phone]  [phone]      Other Contacts   None on File     Current Medical History  Patient Admitting Diagnosis: SDHt is a 73 year old female with medical hx significant for: HTN, hyperlipidemia, IBS, anxiety/depression, diverticulitis, OSA, PAF, DVT on Eliquis. Pt presented to [facility] on 06/25/23 with complaint of headaches. Pt had an unwitnessed fall 4 days prior to presentation. Pt hit back of head and was complaining of posterior headache for several days but refused to seek care. Pt was screaming in pain upon arrival but  became suddenly quiet and had stiffening of arms and gaze to left side. Pt given 1mg  of IV Ativan and taken to CT. cT revealed large left SDH and resulting midline shift. Pt had possible seizure episode in triage. Given IV Keppra. Neurosurgery consulted. Pt Hgb was 6.1. Pt given 2 units of PRBC. Pt underwent emergent left-sided craniotomy for evacuation of subdural hematoma by Dr. Myer Haff on 06/25/23. Repeat CT on 2/16 which showed no evidence of recurrent SDH or stroke. Neurology consulted after possible seizure noted on 06/28/23.MRI was negative for acute changes. EEG on 06/28/23 was suggestive of cortical dysfunction arising from left temporal region likely secondary to SDH/cranioplasty.No seizures were seen. UA positive on 2/21. Started on IV ceftriaxone. Peg tube placed by IR on 07/05/23. Repeat CT was stable. 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 MRI of right shoulder showed rotator cuff tear.  Therapy evaluations completed and CIR recommended d/t pt's deficits in functional mobility and speech deficits.   Patient's medical record from [facility] has been reviewed by the rehabilitation admission coordinator and physician.  Past Medical History  Past Medical History:  Diagnosis Date   Bradycardia  Chest pain    Constipation    Depression    Diverticulitis    Diverticulosis of colon    DOE (dyspnea on exertion)    DVT (deep venous thrombosis) (HCC)    Esophageal stricture    Hyperlipemia    Hypertension    IBS (irritable bowel syndrome)    Sleep apnea     Has the patient had major surgery during 100 days prior to admission? Yes  Family History   family history includes Brain cancer in her father; Breast cancer in her cousin; Colitis in her sister; Colon polyps in her sister; Crohn's disease in her sister; Diabetes in her sister; Heart disease in her mother; Irritable bowel syndrome in her brother.  Current  Medications  Current Facility-Administered Medications:    acetaminophen (TYLENOL) 160 MG/5ML solution 500 mg, 500 mg, Oral, Q4H PRN, Wouk, Wilfred Curtis, MD, 500 mg at 07/11/23 2139   apixaban (ELIQUIS) tablet 5 mg, 5 mg, Per Tube, BID, Wouk, Wilfred Curtis, MD, 5 mg at 07/12/23 0836   atorvastatin (LIPITOR) tablet 20 mg, 20 mg, Per Tube, Daily, Enedina Finner, MD, 20 mg at 07/12/23 1610   cholecalciferol (VITAMIN D3) 10 MCG/ML oral liquid 4,000 Units, 4,000 Units, Per Tube, BID, Gillis Santa, MD, 4,000 Units at 07/12/23 0836   citalopram (CELEXA) tablet 20 mg, 20 mg, Per Tube, Daily, Enedina Finner, MD, 20 mg at 07/12/23 0836   dextrose 50 % solution 12.5 g, 12.5 g, Intravenous, PRN, Enedina Finner, MD, 12.5 g at 07/05/23 1829   docusate (COLACE) 50 MG/5ML liquid 100 mg, 100 mg, Per Tube, BID PRN, Hunt, Madison H, RPH   famotidine (PEPCID) tablet 20 mg, 20 mg, Per Tube, BID, Tressie Ellis, RPH, 20 mg at 07/12/23 0836   feeding supplement (OSMOLITE 1.2 CAL) liquid 237 mL, 237 mL, Per Tube, TID PC, Wouk, Wilfred Curtis, MD   fentaNYL (SUBLIMAZE) injection, , Intravenous, PRN, Sterling Big, MD, 50 mcg at 07/05/23 1415   free water 60 mL, 60 mL, Per Tube, TID PC, Wouk, Wilfred Curtis, MD   glucagon (human recombinant) [facility]) injection, , , PRN, Sterling Big, MD, 1 mg at 07/05/23 1415   ipratropium-albuterol (DUONEB) 0.5-2.5 (3) MG/3ML nebulizer solution 3 mL, 3 mL, Nebulization, Q6H PRN, Gillis Santa, MD   labetalol (NORMODYNE) injection 10 mg, 10 mg, Intravenous, Q2H PRN, Assaker, [place] Bali, MD, 10 mg at 07/01/23 1623   levETIRAcetam (KEPPRA) 100 MG/ML solution 500 mg, 500 mg, Per Tube, BID, Enedina Finner, MD, 500 mg at 07/12/23 0836   LORazepam (ATIVAN) tablet 0.5 mg, 0.5 mg, Per Tube, Q8H PRN, Enedina Finner, MD, 0.5 mg at 07/07/23 1202   metoprolol succinate (TOPROL-XL) 24 hr tablet 25 mg, 25 mg, Oral, Daily, Wouk, Wilfred Curtis, MD, 25 mg at 07/12/23 9604   Oral care mouth rinse, 15 mL,  Mouth Rinse, 4 times per day, Erin Fulling, MD, 15 mL at 07/12/23 0755   Oral care mouth rinse, 15 mL, Mouth Rinse, PRN, Erin Fulling, MD   polyethylene glycol (MIRALAX / GLYCOLAX) packet 17 g, 17 g, Per Tube, Daily, Enedina Finner, MD, 17 g at 07/09/23 5409   QUEtiapine (SEROQUEL) tablet 25 mg, 25 mg, Per Tube, QHS, Lowella Bandy, RPH, 25 mg at 07/11/23 2139   traZODone (DESYREL) tablet 50 mg, 50 mg, Per Tube, QHS PRN, Merryl Hacker, RPH, 50 mg at 07/10/23 2301  Patients Current Diet:  Diet Order  Diet general           DIET DYS 2 Room service appropriate? Yes; Fluid consistency: Thin  Diet effective now                   Precautions / Restrictions Precautions Precautions: Fall Restrictions Weight Bearing Restrictions Per Provider Order: No Other Position/Activity Restrictions: LLQ G-tube placed 07/05/23   Has the patient had 2 or more falls or a fall with injury in the past year? Yes  Prior Activity Level Community (5-7x/wk): gets out daily; drives  Prior Functional Level Self Care: Did the patient need help bathing, dressing, using the toilet or eating? Independent  Indoor Mobility: Did the patient need assistance with walking from room to room (with or without device)? Independent  Stairs: Did the patient need assistance with internal or external stairs (with or without device)? Independent  Functional Cognition: Did the patient need help planning regular tasks such as shopping or remembering to take medications? Independent  Patient Information Are you of Hispanic, Latino/a,or Spanish origin?: X. Patient unable to respond, A. No, not of Hispanic, Latino/a, or Spanish origin What is your race?: X. Patient unable to respond, A. White Do you need or want an interpreter to communicate with a doctor or health care staff?: 9. Unable to respond  Patient's Response To:  Health Literacy and Transportation Is the patient able to respond to health literacy and  transportation needs?: No Health Literacy - How often do you need to have someone help you when you read instructions, pamphlets, or other written material from your doctor or pharmacy?: Patient unable to respond (daughter reported pt was able to read medical information with no assistance) In the past 12 months, has lack of transportation kept you from medical appointments or from getting medications?:  (pt's daughter reported that pt has not missed any medical appointments due to transportation issues) In the past 12 months, has lack of transportation kept you from meetings, work, or from getting things needed for daily living?:  (pt's daughter reports that pt has not missed any non-medical appointments due to transportation issues)  Home Assistive Devices / Equipment Home Equipment: None  Prior Device Use: Indicate devices/aids used by the patient prior to current illness, exacerbation or injury? None of the above  Current Functional Level Cognition  Arousal/Alertness: Awake/alert Overall Cognitive Status: Impaired/Different from baseline Orientation Level: Oriented to person, Oriented to place, Disoriented to time, Disoriented to situation    Extremity Assessment (includes Sensation/Coordination)  Upper Extremity Assessment: Right hand dominant, RUE deficits/detail RUE Deficits / Details: grossly 3+/5, stiffness noted at end ROM, elbow pain with AROM limiting further functional assessment RUE: Unable to fully assess due to pain RUE Coordination: decreased fine motor, decreased gross motor  Lower Extremity Assessment: Defer to PT evaluation, RLE deficits/detail RLE Deficits / Details: rigidity in R LE, tremors noted throughout LEs. RLE Coordination: decreased gross motor    ADLs  Overall ADL's : Needs assistance/impaired Eating/Feeding: Sitting, Maximal assistance Eating/Feeding Details (indicate cue type and reason): +2 assist to maintain static sitting at EOB with MAX A to support  loading spoon and to bring food to mouth using LUE. Pt does attempt to grip spoon and scoop bites on her own with poor coordination appreciated and decreased accuracy with bringing hand to mouth. Grooming: Sitting, Wash/dry face, Maximal assistance Grooming Details (indicate cue type and reason): +2 assist to maintain static sitting at EOB. MAX A to bring hand to face this date. Caregiver at  bedside provides cueing for pt to wash face. Pt noted to have some difficulty with this task, dabs more at face with cloth vs. using a washing motion. Appears more limited by cognition this date. Lower Body Bathing: Total assistance, Bed level Lower Body Bathing Details (indicate cue type and reason): TOTAL A to don bilat socks at bed level. Upper Body Dressing : Sitting, Moderate assistance, Minimal assistance Upper Body Dressing Details (indicate cue type and reason): educated in hemi techniques Functional mobility during ADLs: +2 for physical assistance, Total assistance General ADL Comments: MAX A hand over hand face washing using non-dominant L hand. MAX A don B socks in sitting, +2 for trunk control. MAX A don/doff gown in sitting    Mobility  Overal bed mobility: Needs Assistance Bed Mobility: Supine to Sit Rolling: Mod assist Supine to sit: Contact guard, HOB elevated, Used rails Sit to supine: Max assist General bed mobility comments: increased effort, time to complete with VC for bed rail use coming to the R side of the bed    Transfers  Overall transfer level: Needs assistance Equipment used: 1 person hand held assist Transfers: Sit to/from Stand, Bed to chair/wheelchair/BSC Sit to Stand: Contact guard assist Bed to/from chair/wheelchair/BSC transfer type:: Step pivot Stand pivot transfers: Min assist Step pivot transfers: Min assist  Lateral/Scoot Transfers: Total assist, +2 physical assistance General transfer comment: RUE supported, R knee blocked for standing    Ambulation / Gait /  Stairs / Wheelchair Mobility  Ambulation/Gait Ambulation/Gait assistance: Min assist, Contact guard assist Gait Distance (Feet):  (53ft twice, and third time 18ft) General Gait Details: able to use L rail, ambulate several bouts with CGA-minA, progressed to ambulated with bilateral UE support, CGA. very close guard for potential R knee buckling Pre-gait activities: emphasis on posture and increased standing tolerance. standing tolerance of around 30 seconds    Posture / Balance Dynamic Sitting Balance Sitting balance - Comments: able to weight shift on BSC Balance Overall balance assessment: Needs assistance Sitting-balance support: Feet supported, Single extremity supported Sitting balance-Leahy Scale: Good Sitting balance - Comments: able to weight shift on BSC Postural control: Right lateral lean, Posterior lean Standing balance support: Single extremity supported Standing balance-Leahy Scale: Fair Standing balance comment: reliant on bedrail on L side for support but progressing to being able to stand with CGA (R knee blocked for safety). intermittent posterior lean noted    Special needs/care consideration Skin Ecchymosis: buttocks/bilateral; Erythema/Redness: back/left; Surgical Incision: head/left; Bladder incontinence and G-tube   Previous Home Environment (from acute therapy documentation) Living Arrangements: Alone Available Help at Discharge: Family, Available 24 hours/day Type of Home: Mobile home Home Layout: One level Home Access: Stairs to enter Entrance Stairs-Rails: Right, Left, Can reach both Entrance Stairs-Number of Steps: 5 Bathroom Shower/Tub: Engineer, manufacturing systems: Standard Bathroom Accessibility: Yes How Accessible: Accessible via walker Home Care Services: No Additional Comments: per sister in law  Discharge Living Setting Plans for Discharge Living Setting: House Type of Home at Discharge: House Discharge Home Layout: One level Discharge  Home Access: Stairs to enter Entrance Stairs-Rails: Right, Left, Can reach both Entrance Stairs-Number of Steps: 4-5 Discharge Bathroom Shower/Tub: Tub/shower unit Discharge Bathroom Toilet: Standard Discharge Bathroom Accessibility: Yes How Accessible: Accessible via walker  Social/Family/Support Systems Anticipated Caregiver: [name], daughter Anticipated Caregiver's Contact Information: [phone] Caregiver Availability: 24/7 Discharge Plan Discussed with Primary Caregiver: Yes Is Caregiver In Agreement with Plan?: Yes Does Caregiver/Family have Issues with Lodging/Transportation while Pt is in Rehab?: No  Goals Patient/Family Goal for Rehab: Supervision: PT, Supervision-Min A:OT/ST Expected length of stay: 10-12 days Pt/Family Agrees to Admission and willing to participate: Yes Program Orientation Provided & Reviewed with Pt/Caregiver Including Roles  & Responsibilities: Yes  Decrease burden of Care through IP rehab admission: NA  Possible need for SNF placement upon discharge: Not anticipated  Patient Condition: I have reviewed medical records from [facility], spoken with CSW, and patient and daughter. I discussed via phone for inpatient rehabilitation assessment.  Patient will benefit from ongoing PT, OT, and SLP, can actively participate in 3 hours of therapy a day 5 days of the week, and can make measurable gains during the admission.  Patient will also benefit from the coordinated team approach during an Inpatient Acute Rehabilitation admission.  The patient will receive intensive therapy as well as Rehabilitation physician, nursing, social worker, and care management interventions.  Due to bladder management, safety, skin/wound care, disease management, medication administration, pain management, and patient education the patient requires 24 hour a day rehabilitation nursing.  The patient is currently Contact Guard A-Min A with mobility and Max A  with basic ADLs.  Discharge setting and therapy post discharge at home with home health is anticipated.  Patient has agreed to participate in the Acute Inpatient Rehabilitation Program and will admit today.  Preadmission Screen Completed By:  Domingo Pulse, 07/12/2023 1:23 PM ______________________________________________________________________   Discussed status with Dr. Natale Lay on 07/12/23 at 1:23 PM and received approval for admission today.  Admission Coordinator:  Domingo Pulse, CCC-SLP, time 1:23 PM/Date 07/12/23    Assessment/Plan: Diagnosis: Subdural Hematoma with midline shift Does the need for close, 24 hr/day Medical supervision in concert with the patient's rehab needs make it unreasonable for this patient to be served in a less intensive setting? Yes Co-Morbidities requiring supervision/potential complications: Acute CVA, UTI, Fever, Diarrhea, thrush, ABLA, IDA, PAF, HTN, HLD, anxiety and depression, dysphagia, R shoulder pain Due to bladder management, bowel management, safety, skin/wound care, disease management, medication administration, pain management, and patient education, does the patient require 24 hr/day rehab nursing? Yes Does the patient require coordinated care of a physician, rehab nurse, PT, OT, and SLP to address physical and functional deficits in the context of the above medical diagnosis(es)? Yes Addressing deficits in the following areas: balance, endurance, locomotion, strength, transferring, bowel/bladder control, bathing, dressing, feeding, grooming, toileting, cognition, speech, language, swallowing, and psychosocial support Can the patient actively participate in an intensive therapy program of at least 3 hrs of therapy 5 days a week? Yes The potential for patient to make measurable gains while on inpatient rehab is excellent Anticipated functional outcomes upon discharge from inpatient rehab: supervision PT, supervision and min assist OT,  supervision and min assist SLP Estimated rehab length of stay to reach the above functional goals is: 10-12 days Anticipated discharge destination: Home 10. Overall Rehab/Functional Prognosis: excellent   MD Signature: Fanny Dance

## 2023-07-12 NOTE — Progress Notes (Signed)
 Subjective: Patient awake and alert.  Affect is bright. She states that she is motivated to work with PT and she and daughter are excited that she is being re-evaluated by CIR. She is anti-gravity in her RUE today.  Objective: Current vital signs: BP (!) 153/67 (BP Location: Left Arm)   Pulse (!) 101   Temp 98.1 F (36.7 C) (Oral)   Resp 18   Ht 5' 0.98" (1.549 m)   Wt 61.5 kg   SpO2 100%   BMI 25.63 kg/m  Vital signs in last 24 hours: Temp:  [98 F (36.7 C)-98.7 F (37.1 C)] 98.1 F (36.7 C) (03/03 0420) Pulse Rate:  [73-101] 101 (03/03 0420) Resp:  [15-18] 18 (03/02 1647) BP: (125-153)/(56-69) 153/67 (03/03 0420) SpO2:  [97 %-100 %] 100 % (03/03 0420) Weight:  [61.5 kg] 61.5 kg (03/03 0500)  Intake/Output from previous day: 03/02 0701 - 03/03 0700 In: 1465.7 [P.O.:240; I.V.:223.7; NG/GT:1002] Out: -  Intake/Output this shift: No intake/output data recorded. Nutritional status:  Diet Order             DIET DYS 2 Room service appropriate? Yes; Fluid consistency: Thin  Diet effective now                   Neurological Examination   Mental Status: Alert and awake,  Follows some nonverbal commands. Aphasic.   Cranial Nerves: II: Blinks to bilateral confrontation.   III,IV, VI: ptosis not present, extra-ocular motions intact bilaterally V,VII: smile symmetric VIII: hearing normal bilaterally Motor: Right : Upper extremity   3/5    Left:     Upper extremity   5/5  Lower extremity   3-/5     Lower extremity   5/5 Tone and bulk:normal tone throughout; no atrophy noted Deep Tendon Reflexes: 2+ and symmetric throughout Cerebellar: Unable to perform Gait: not tested due to safety concerns     Lab Results: Basic Metabolic Panel: Recent Labs  Lab 07/07/23 1358 07/10/23 0330 07/11/23 0316 07/12/23 0456  NA 131* 136 136 136  K 4.6 4.3 4.6 4.0  CL 98 102 104 102  CO2 23 23 24 23   GLUCOSE 136* 115* 95 121*  BUN 24* 21 21 18   CREATININE 0.74 0.68 0.70 0.80   CALCIUM 9.2 9.9 9.7 10.1    Liver Function Tests: Recent Labs  Lab 07/07/23 1358  AST 50*  ALT 57*  ALKPHOS 193*  BILITOT 0.5  PROT 6.9  ALBUMIN 3.4*   No results for input(s): "LIPASE", "AMYLASE" in the last 168 hours. No results for input(s): "AMMONIA" in the last 168 hours.  CBC: Recent Labs  Lab 07/07/23 1358 07/09/23 0910 07/10/23 0330 07/10/23 2357 07/12/23 0456  WBC 10.3 11.4* 11.0* 10.4 10.7*  NEUTROABS 7.0  --   --   --   --   HGB 13.1 14.2 13.1 12.7 12.9  HCT 38.0 42.0 39.2 37.2 38.1  MCV 88.6 89.9 91.8 91.4 90.3  PLT 394 534* 475* 497* 545*    Cardiac Enzymes: No results for input(s): "CKTOTAL", "CKMB", "CKMBINDEX", "TROPONINI" in the last 168 hours.  Lipid Panel: Recent Labs  Lab 07/09/23 0504  CHOL 131  TRIG 123  HDL 38*  CHOLHDL 3.4  VLDL 25  LDLCALC 68    CBG: Recent Labs  Lab 07/11/23 0330 07/11/23 1212 07/11/23 1649 07/12/23 0009 07/12/23 0554  GLUCAP 101* 88 90 86 110*    Microbiology: Results for orders placed or performed during the hospital encounter of 06/25/23  Culture, blood (Routine X 2) w Reflex to ID Panel     Status: None   Collection Time: 07/02/23  8:59 AM   Specimen: BLOOD  Result Value Ref Range Status   Specimen Description BLOOD BLOOD LEFT HAND  Final   Special Requests   Final    BOTTLES DRAWN AEROBIC AND ANAEROBIC Blood Culture adequate volume   Culture   Final    NO GROWTH 5 DAYS Performed at [facility], [address] [place], [place] [address]    Report Status 07/07/2023 FINAL  Final  Culture, blood (Routine X 2) w Reflex to ID Panel     Status: None   Collection Time: 07/02/23  9:06 AM   Specimen: BLOOD  Result Value Ref Range Status   Specimen Description BLOOD RW  Final   Special Requests   Final    BOTTLES DRAWN AEROBIC AND ANAEROBIC Blood Culture adequate volume   Culture   Final    NO GROWTH 5 DAYS Performed at [facility], [address] [place], [place]  [address]    Report Status 07/07/2023 FINAL  Final  Urine Culture     Status: Abnormal   Collection Time: 07/02/23 12:43 PM   Specimen: Urine, Random  Result Value Ref Range Status   Specimen Description   Final    URINE, RANDOM Performed at [facility], [address] [place], [place] [address]    Special Requests   Final    NONE Reflexed from [phone] Performed at [facility], [address] [place], [place] [address]    Culture (A)  Final    80,000 COLONIES/mL ESCHERICHIA COLI 10,000 COLONIES/mL CITROBACTER SPECIES    Report Status 07/04/2023 FINAL  Final   Organism ID, Bacteria ESCHERICHIA COLI (A)  Final   Organism ID, Bacteria CITROBACTER SPECIES (A)  Final      Susceptibility   Citrobacter species - MIC*    CEFEPIME <=0.12 SENSITIVE Sensitive     CEFTRIAXONE <=0.25 SENSITIVE Sensitive     CIPROFLOXACIN <=0.25 SENSITIVE Sensitive     GENTAMICIN <=1 SENSITIVE Sensitive     IMIPENEM <=0.25 SENSITIVE Sensitive     NITROFURANTOIN <=16 SENSITIVE Sensitive     TRIMETH/SULFA <=20 SENSITIVE Sensitive     PIP/TAZO <=4 SENSITIVE Sensitive ug/mL    * 10,000 COLONIES/mL CITROBACTER SPECIES   Escherichia coli - MIC*    AMPICILLIN <=2 SENSITIVE Sensitive     CEFAZOLIN <=4 SENSITIVE Sensitive     CEFEPIME <=0.12 SENSITIVE Sensitive     CEFTRIAXONE <=0.25 SENSITIVE Sensitive     CIPROFLOXACIN <=0.25 SENSITIVE Sensitive     GENTAMICIN <=1 SENSITIVE Sensitive     IMIPENEM <=0.25 SENSITIVE Sensitive     NITROFURANTOIN <=16 SENSITIVE Sensitive     TRIMETH/SULFA <=20 SENSITIVE Sensitive     AMPICILLIN/SULBACTAM <=2 SENSITIVE Sensitive     PIP/TAZO <=4 SENSITIVE Sensitive ug/mL    * 80,000 COLONIES/mL ESCHERICHIA COLI  Resp panel by RT-PCR (RSV, Flu A&B, Covid) Anterior Nasal Swab     Status: None   Collection Time: 07/07/23  9:15 AM   Specimen: Anterior Nasal Swab  Result Value Ref Range Status   SARS Coronavirus 2 by RT PCR NEGATIVE NEGATIVE Final    Comment:  (NOTE) SARS-CoV-2 target nucleic acids are NOT DETECTED.  The SARS-CoV-2 RNA is generally detectable in upper respiratory specimens during the acute phase of infection. The lowest concentration of SARS-CoV-2 viral copies this assay can detect is 138 copies/mL. A negative  result does not preclude SARS-Cov-2 infection and should not be used as the sole basis for treatment or other patient management decisions. A negative result may occur with  improper specimen collection/handling, submission of specimen other than nasopharyngeal swab, presence of viral mutation(s) within the areas targeted by this assay, and inadequate number of viral copies(<138 copies/mL). A negative result must be combined with clinical observations, patient history, and epidemiological information. The expected result is Negative.  Fact Sheet for Patients:  BloggerCourse.com  Fact Sheet for Healthcare Providers:  SeriousBroker.it  This test is no t yet approved or cleared by the [place] FDA and  has been authorized for detection and/or diagnosis of SARS-CoV-2 by FDA under an Emergency Use Authorization (EUA). This EUA will remain  in effect (meaning this test can be used) for the duration of the COVID-19 declaration under Section 564(b)(1) of the Act, 21 U.S.C.section 360bbb-3(b)(1), unless the authorization is terminated  or revoked sooner.       Influenza A by PCR NEGATIVE NEGATIVE Final   Influenza B by PCR NEGATIVE NEGATIVE Final    Comment: (NOTE) The Xpert Xpress SARS-CoV-2/FLU/RSV plus assay is intended as an aid in the diagnosis of influenza from Nasopharyngeal swab specimens and should not be used as a sole basis for treatment. Nasal washings and aspirates are unacceptable for Xpert Xpress SARS-CoV-2/FLU/RSV testing.  Fact Sheet for Patients: BloggerCourse.com  Fact Sheet for Healthcare  Providers: SeriousBroker.it  This test is not yet approved or cleared by the [place] FDA and has been authorized for detection and/or diagnosis of SARS-CoV-2 by FDA under an Emergency Use Authorization (EUA). This EUA will remain in effect (meaning this test can be used) for the duration of the COVID-19 declaration under Section 564(b)(1) of the Act, 21 U.S.C. section 360bbb-3(b)(1), unless the authorization is terminated or revoked.     Resp Syncytial Virus by PCR NEGATIVE NEGATIVE Final    Comment: (NOTE) Fact Sheet for Patients: BloggerCourse.com  Fact Sheet for Healthcare Providers: SeriousBroker.it  This test is not yet approved or cleared by the [place] FDA and has been authorized for detection and/or diagnosis of SARS-CoV-2 by FDA under an Emergency Use Authorization (EUA). This EUA will remain in effect (meaning this test can be used) for the duration of the COVID-19 declaration under Section 564(b)(1) of the Act, 21 U.S.C. section 360bbb-3(b)(1), unless the authorization is terminated or revoked.  Performed at [facility], [address] [place], [place] [address]   Culture, blood (Routine X 2) w Reflex to ID Panel     Status: None   Collection Time: 07/07/23  1:58 PM   Specimen: BLOOD  Result Value Ref Range Status   Specimen Description BLOOD BLOOD LEFT ARM  Final   Special Requests   Final    BOTTLES DRAWN AEROBIC AND ANAEROBIC Blood Culture adequate volume   Culture   Final    NO GROWTH 5 DAYS Performed at [facility], [address] [place], [place] [address]    Report Status 07/12/2023 FINAL  Final  Culture, blood (Routine X 2) w Reflex to ID Panel     Status: None   Collection Time: 07/07/23  2:05 PM   Specimen: BLOOD  Result Value Ref Range Status   Specimen Description BLOOD BLOOD RIGHT HAND  Final   Special Requests   Final    BOTTLES DRAWN  AEROBIC AND ANAEROBIC BLOOD RIGHT HAND   Culture   Final    NO GROWTH 5 DAYS  Performed at [facility], [address] [place], [place] [address]    Report Status 07/12/2023 FINAL  Final  Respiratory (~20 pathogens) panel by PCR     Status: None   Collection Time: 07/07/23  2:13 PM   Specimen: Nasopharyngeal Swab; Respiratory  Result Value Ref Range Status   Adenovirus NOT DETECTED NOT DETECTED Final   Coronavirus 229E NOT DETECTED NOT DETECTED Final    Comment: (NOTE) The Coronavirus on the Respiratory Panel, DOES NOT test for the novel  Coronavirus (2019 nCoV)    Coronavirus HKU1 NOT DETECTED NOT DETECTED Final   Coronavirus NL63 NOT DETECTED NOT DETECTED Final   Coronavirus OC43 NOT DETECTED NOT DETECTED Final   Metapneumovirus NOT DETECTED NOT DETECTED Final   Rhinovirus / Enterovirus NOT DETECTED NOT DETECTED Final   Influenza A NOT DETECTED NOT DETECTED Final   Influenza B NOT DETECTED NOT DETECTED Final   Parainfluenza Virus 1 NOT DETECTED NOT DETECTED Final   Parainfluenza Virus 2 NOT DETECTED NOT DETECTED Final   Parainfluenza Virus 3 NOT DETECTED NOT DETECTED Final   Parainfluenza Virus 4 NOT DETECTED NOT DETECTED Final   Respiratory Syncytial Virus NOT DETECTED NOT DETECTED Final   Bordetella pertussis NOT DETECTED NOT DETECTED Final   Bordetella Parapertussis NOT DETECTED NOT DETECTED Final   Chlamydophila pneumoniae NOT DETECTED NOT DETECTED Final   Mycoplasma pneumoniae NOT DETECTED NOT DETECTED Final    Comment: Performed at [facility], [address] [place], [place] [address]  Gastrointestinal Panel by PCR , Stool     Status: None   Collection Time: 07/07/23  5:38 PM   Specimen: Stool  Result Value Ref Range Status   Campylobacter species NOT DETECTED NOT DETECTED Final   Plesimonas shigelloides NOT DETECTED NOT DETECTED Final   Salmonella species NOT DETECTED NOT DETECTED Final   Yersinia enterocolitica NOT DETECTED NOT DETECTED Final    Vibrio species NOT DETECTED NOT DETECTED Final   Vibrio cholerae NOT DETECTED NOT DETECTED Final   Enteroaggregative E coli (EAEC) NOT DETECTED NOT DETECTED Final   Enteropathogenic E coli (EPEC) NOT DETECTED NOT DETECTED Final   Enterotoxigenic E coli (ETEC) NOT DETECTED NOT DETECTED Final   Shiga like toxin producing E coli (STEC) NOT DETECTED NOT DETECTED Final   Shigella/Enteroinvasive E coli (EIEC) NOT DETECTED NOT DETECTED Final   Cryptosporidium NOT DETECTED NOT DETECTED Final   Cyclospora cayetanensis NOT DETECTED NOT DETECTED Final   Entamoeba histolytica NOT DETECTED NOT DETECTED Final   Giardia lamblia NOT DETECTED NOT DETECTED Final   Adenovirus F40/41 NOT DETECTED NOT DETECTED Final   Astrovirus NOT DETECTED NOT DETECTED Final   Norovirus GI/GII NOT DETECTED NOT DETECTED Final   Rotavirus A NOT DETECTED NOT DETECTED Final   Sapovirus (I, II, IV, and V) NOT DETECTED NOT DETECTED Final    Comment: Performed at [facility], [address] [place], [place] [address]  Urine Culture (for pregnant, neutropenic or urologic patients or patients with an indwelling urinary catheter)     Status: Abnormal   Collection Time: 07/07/23  6:21 PM   Specimen: Urine, Clean Catch  Result Value Ref Range Status   Specimen Description   Final    URINE, CLEAN CATCH Performed at [facility], [address], [place], [place] [address]    Special Requests   Final    NONE Performed at [facility], [address] [place], [place] [address]    Culture >=100,000 COLONIES/mL ENTEROCOCCUS  FAECALIS (A)  Final   Report Status 07/09/2023 FINAL  Final   Organism ID, Bacteria ENTEROCOCCUS FAECALIS (A)  Final      Susceptibility   Enterococcus faecalis - MIC*    AMPICILLIN <=2 SENSITIVE Sensitive     NITROFURANTOIN <=16 SENSITIVE Sensitive     VANCOMYCIN 1 SENSITIVE Sensitive     * >=100,000 COLONIES/mL ENTEROCOCCUS FAECALIS  Urine Culture     Status: Abnormal    Collection Time: 07/10/23  2:58 PM   Specimen: Urine, Random  Result Value Ref Range Status   Specimen Description   Final    URINE, RANDOM Performed at [facility], [address] [place], [place] [address]    Special Requests   Final    NONE Reflexed from [id number] Performed at [facility], [address] [place], [place] [address]    Culture >=100,000 COLONIES/mL ENTEROCOCCUS FAECALIS (A)  Final   Report Status 07/12/2023 FINAL  Final   Organism ID, Bacteria ENTEROCOCCUS FAECALIS (A)  Final      Susceptibility   Enterococcus faecalis - MIC*    AMPICILLIN <=2 SENSITIVE Sensitive     NITROFURANTOIN <=16 SENSITIVE Sensitive     VANCOMYCIN 1 SENSITIVE Sensitive     * >=100,000 COLONIES/mL ENTEROCOCCUS FAECALIS    Coagulation Studies: Recent Labs    07/09/23 0910  LABPROT 13.2  INR 1.0    Imaging: US Carotid Bilateral Result Date: 07/11/2023 CLINICAL DATA:  Stroke EXAM: BILATERAL CAROTID DUPLEX ULTRASOUND TECHNIQUE: Wallace Cullens scale imaging, color Doppler and duplex ultrasound were performed of bilateral carotid and vertebral arteries in the neck. COMPARISON:  None available FINDINGS: Criteria: Quantification of carotid stenosis is based on velocity parameters that correlate the residual internal carotid diameter with NASCET-based stenosis levels, using the diameter of the distal internal carotid lumen as the denominator for stenosis measurement. The following velocity measurements were obtained: RIGHT ICA: 76/15 cm/sec CCA: 84/11 cm/sec SYSTOLIC ICA/CCA RATIO:  0.9 ECA: 119 cm/sec LEFT ICA: 110/18 cm/sec CCA: 87/12 cm/sec SYSTOLIC ICA/CCA RATIO:  1.3 ECA: 130 cm/sec RIGHT CAROTID ARTERY: Minimal atheromatous plaque of the right proximal internal carotid artery. RIGHT VERTEBRAL ARTERY:  Antegrade flow. LEFT CAROTID ARTERY:  No significant atheromatous plaque. LEFT VERTEBRAL ARTERY:  Antegrade flow. IMPRESSION: No significant stenosis of internal carotid arteries.  Electronically Signed   By: Acquanetta Belling M.D.   On: 07/11/2023 16:30    Medications: I have reviewed the patient's current medications. Scheduled:  apixaban  5 mg Per Tube BID   atorvastatin  20 mg Per Tube Daily   cholecalciferol  4,000 Units Per Tube BID   citalopram  20 mg Per Tube Daily   famotidine  20 mg Per Tube BID   free water  50 mL Per Tube Q4H   levETIRAcetam  500 mg Per Tube BID   metoprolol succinate  25 mg Oral Daily   mouth rinse  15 mL Mouth Rinse 4 times per day   polyethylene glycol  17 g Per Tube Daily   QUEtiapine  25 mg Per Tube QHS   Continuous:  feeding supplement (OSMOLITE 1.2 CAL) Stopped (07/11/23 1052)    Assessment/Plan: 73 year old female with SDH. Per neurosurgery guidance was previously to restart home eliquis post-op day 14 which is today.  MRI of the brain performed showing two small acute infarcts in the right frontal and right parietal cortices favored to be embolic in the setting of holding eliquis for SDH. She has done well on heparin gtt  x48 hrs and OK to transition to po eliquis (POD 16; previously cleared to restart eliquis POD 14 by NSG). Stroke workup completed. Patient is highly motivated and I think CIR would be excellent for her if she is deemed appropriate. Her R sided impairment is 2/2 a combination of weakness and neglect but she is anti-gravity in RUE today which is the best I have seen since admission.  OK to start eliquis Outpatient neurology referral at discharge Neurology to s/o, please re-engage if additional questions arise. Bing Neighbors, MD [facility] [phone]  If 7pm- 7am, please page neurology on call as listed in AMION.

## 2023-07-12 NOTE — Progress Notes (Signed)
 Orthopedic Tech Progress Note Patient Details:  [name] 26-Apr-1951 [id number]  Ortho Devices Type of Ortho Device: Arm sling Ortho Device/Splint Location: RUE Ortho Device/Splint Interventions: Orderedcalled in order to [employer] for a PRAFO BOOT and RESTING WHO. Patient was very upset about something, so family at bedside asked if I could have the RN come in, I did let them know that it was shift change and a new RN/LPN would be in there shortly    Post Interventions Patient Tolerated: Other (comment) Instructions Provided: Care of device  Donald Pore 07/12/2023, 7:07 PM

## 2023-07-12 NOTE — Progress Notes (Signed)
 Inpatient Rehabilitation Admission Medication Review by a Pharmacist  A complete drug regimen review was completed for this patient to identify any potential clinically significant medication issues.  High Risk Drug Classes Is patient taking? Indication by Medication  Antipsychotic Yes Seroquel: mood  Anticoagulant Yes Eliquis: AFib  Antibiotic No   Opioid No   Antiplatelet No   Hypoglycemics/insulin No   Vasoactive Medication Yes Metoprolol: AFib  Chemotherapy No   Other Yes Celexa: anxiety/depression Lorazepam: anxiety Keppra: seizure ppx  Lipitor: HLD Pepcid: reflux vitD: vitamin/supplement Tylenol: pain Trazodone: sleep Benadryl: itching Miralax, Fleet, Bisacodyl: constipation Compazine: nausea/vomiting Mylanta:indigestion Robitussin: cough Duoneb: SOB     Type of Medication Issue Identified Description of Issue Recommendation(s)  Drug Interaction(s) (clinically significant)     Duplicate Therapy     Allergy     No Medication Administration End Date     Incorrect Dose     Additional Drug Therapy Needed     Significant med changes from prior encounter (inform family/care partners about these prior to discharge). Meds held during inpatient admission: spironolactone, Imdur, diltiazem  Amlodipine was used temporarily while inpatient (Not a PTA med that I can see) Restart or discontinue as appropriate. Communicate medication changes with patient/family at discharge  Other       Clinically significant medication issues were identified that warrant physician communication and completion of prescribed/recommended actions by midnight of the next day:  No   Time spent performing this drug regimen review (minutes): 30  Thank you for allowing pharmacy to be a part of this patient's care.   Signe Colt, PharmD 07/12/2023 3:13 PM   **Pharmacist phone directory can be found on [email or web address] listed under [facility]**

## 2023-07-12 NOTE — Progress Notes (Signed)
 Inpatient Rehab Admissions Coordinator:  There is a bed available in CIR today. Dr. Ashok Pall aware and in agreement. Pt, pt's daughter [name], NSG and TOC made aware.   Wolfgang Phoenix, MS, CCC-SLP Admissions Coordinator [phone]

## 2023-07-12 NOTE — Progress Notes (Signed)
 Report given to CIR nurse. Daughter at bedside. Awaiting [facility] pickup.

## 2023-07-12 NOTE — Progress Notes (Signed)
 Signed     Expand All Collapse All PMR Admission Coordinator Pre-Admission Assessment   Patient: [name] is an 73 y.o., female MRN: [id number] DOB: Sep 22, 1950 Height: 5' 0.98" (154.9 cm) Weight: 61.5 kg   Insurance Information HMO:     PPO:      PCP:      IPA:      80/20: yes     OTHER:  PRIMARY: Medicare Part A and B      Policy#: [id number]      Subscriber: patient CM Name:       Phone#:      Fax#:  Pre-Cert#:       Employer:  Benefits:  Phone #: verified eligibility via [employer] on 07/12/23     Name:  Eff. Date: Part A and Part B effective 03/11/16     Deduct: $1,676      Out of Pocket Max: NA      Life Max: NA CIR: 100% coverage      SNF: 100% coverage for days 1-20, 80% coverage for days 21-100 Outpatient: 80% coverage     Co-Pay: 20%  Home Health: 100% coverage      Co-Pay:  DME: 80% coverage     Co-Pay: 20% Providers: pt's choice SECONDARY: Medicaid of [place]      Policy#: [id number] p     Phone#:    Financial Counselor:       Phone#:    The Engineer, materials Information Summary" for patients in Inpatient Rehabilitation Facilities with attached "Privacy Act Statement-Health Care Records" was provided and verbally reviewed with: Patient and Family   Emergency Contact Information Contact Information       Name Relation Home Work Mobile    [place] Daughter [phone]   [phone]    Davidson,Chris Relative [phone]   [phone]    Leonie Green [phone]   [phone]         Other Contacts   None on File        Current Medical History  Patient Admitting Diagnosis: SDHt is a 73 year old female with medical hx significant for: HTN, hyperlipidemia, IBS, anxiety/depression, diverticulitis, OSA, PAF, DVT on Eliquis. Pt presented to [facility] on 06/25/23 with complaint of headaches. Pt had an unwitnessed fall 4 days prior to presentation. Pt hit back of head and was complaining of posterior headache for several days but  refused to seek care. Pt was screaming in pain upon arrival but became suddenly quiet and had stiffening of arms and gaze to left side. Pt given 1mg  of IV Ativan and taken to CT. cT revealed large left SDH and resulting midline shift. Pt had possible seizure episode in triage. Given IV Keppra. Neurosurgery consulted. Pt Hgb was 6.1. Pt given 2 units of PRBC. Pt underwent emergent left-sided craniotomy for evacuation of subdural hematoma by Dr. Myer Haff on 06/25/23. Repeat CT on 2/16 which showed no evidence of recurrent SDH or stroke. Neurology consulted after possible seizure noted on 06/28/23.MRI was negative for acute changes. EEG on 06/28/23 was suggestive of cortical dysfunction arising from left temporal region likely secondary to SDH/cranioplasty.No seizures were seen. UA positive on 2/21. Started on IV ceftriaxone. Peg tube placed by IR on 07/05/23. Repeat CT was stable. 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 MRI of right shoulder showed rotator cuff tear.  Therapy evaluations completed and CIR recommended d/t pt's deficits in functional mobility and speech deficits.    Patient's medical record from  [facility] has been reviewed by the rehabilitation admission coordinator and physician.   Past Medical History      Past Medical History:  Diagnosis Date   Bradycardia     Chest pain     Constipation     Depression     Diverticulitis     Diverticulosis of colon     DOE (dyspnea on exertion)     DVT (deep venous thrombosis) (HCC)     Esophageal stricture     Hyperlipemia     Hypertension     IBS (irritable bowel syndrome)     Sleep apnea            Has the patient had major surgery during 100 days prior to admission? Yes   Family History   family history includes Brain cancer in her father; Breast cancer in her cousin; Colitis in her sister; Colon polyps in her sister; Crohn's disease in her sister; Diabetes in her sister; Heart  disease in her mother; Irritable bowel syndrome in her brother.   Current Medications  Current Medications    Current Facility-Administered Medications:    acetaminophen (TYLENOL) 160 MG/5ML solution 500 mg, 500 mg, Oral, Q4H PRN, Wouk, Wilfred Curtis, MD, 500 mg at 07/11/23 2139   apixaban (ELIQUIS) tablet 5 mg, 5 mg, Per Tube, BID, Wouk, Wilfred Curtis, MD, 5 mg at 07/12/23 0836   atorvastatin (LIPITOR) tablet 20 mg, 20 mg, Per Tube, Daily, Enedina Finner, MD, 20 mg at 07/12/23 1610   cholecalciferol (VITAMIN D3) 10 MCG/ML oral liquid 4,000 Units, 4,000 Units, Per Tube, BID, Gillis Santa, MD, 4,000 Units at 07/12/23 0836   citalopram (CELEXA) tablet 20 mg, 20 mg, Per Tube, Daily, Enedina Finner, MD, 20 mg at 07/12/23 0836   dextrose 50 % solution 12.5 g, 12.5 g, Intravenous, PRN, Enedina Finner, MD, 12.5 g at 07/05/23 1829   docusate (COLACE) 50 MG/5ML liquid 100 mg, 100 mg, Per Tube, BID PRN, Hunt, Madison H, RPH   famotidine (PEPCID) tablet 20 mg, 20 mg, Per Tube, BID, Tressie Ellis, RPH, 20 mg at 07/12/23 0836   feeding supplement (OSMOLITE 1.2 CAL) liquid 237 mL, 237 mL, Per Tube, TID PC, Wouk, Wilfred Curtis, MD   fentaNYL (SUBLIMAZE) injection, , Intravenous, PRN, Sterling Big, MD, 50 mcg at 07/05/23 1415   free water 60 mL, 60 mL, Per Tube, TID PC, Wouk, Wilfred Curtis, MD   glucagon (human recombinant) [facility]) injection, , , PRN, Sterling Big, MD, 1 mg at 07/05/23 1415   ipratropium-albuterol (DUONEB) 0.5-2.5 (3) MG/3ML nebulizer solution 3 mL, 3 mL, Nebulization, Q6H PRN, Gillis Santa, MD   labetalol (NORMODYNE) injection 10 mg, 10 mg, Intravenous, Q2H PRN, Assaker, [place] B[place], MD, 10 mg at 07/01/23 1623   levETIRAcetam (KEPPRA) 100 MG/ML solution 500 mg, 500 mg, Per Tube, BID, Enedina Finner, MD, 500 mg at 07/12/23 0836   LORazepam (ATIVAN) tablet 0.5 mg, 0.5 mg, Per Tube, Q8H PRN, Enedina Finner, MD, 0.5 mg at 07/07/23 1202   metoprolol succinate (TOPROL-XL) 24 hr tablet 25 mg, 25  mg, Oral, Daily, Wouk, Wilfred Curtis, MD, 25 mg at 07/12/23 9604   Oral care mouth rinse, 15 mL, Mouth Rinse, 4 times per day, Erin Fulling, MD, 15 mL at 07/12/23 0755   Oral care mouth rinse, 15 mL, Mouth Rinse, PRN, Erin Fulling, MD   polyethylene glycol (MIRALAX / GLYCOLAX) packet 17 g, 17 g, Per Tube, Daily, Enedina Finner, MD, 17 g at 07/09/23 [phone]  QUEtiapine (SEROQUEL) tablet 25 mg, 25 mg, Per Tube, QHS, Lowella Bandy, RPH, 25 mg at 07/11/23 2139   traZODone (DESYREL) tablet 50 mg, 50 mg, Per Tube, QHS PRN, Merryl Hacker, RPH, 50 mg at 07/10/23 2301     Patients Current Diet:  Diet Order                  Diet general             DIET DYS 2 Room service appropriate? Yes; Fluid consistency: Thin  Diet effective now                         Precautions / Restrictions Precautions Precautions: Fall Restrictions Weight Bearing Restrictions Per Provider Order: No Other Position/Activity Restrictions: LLQ G-tube placed 07/05/23    Has the patient had 2 or more falls or a fall with injury in the past year? Yes   Prior Activity Level Community (5-7x/wk): gets out daily; drives   Prior Functional Level Self Care: Did the patient need help bathing, dressing, using the toilet or eating? Independent   Indoor Mobility: Did the patient need assistance with walking from room to room (with or without device)? Independent   Stairs: Did the patient need assistance with internal or external stairs (with or without device)? Independent   Functional Cognition: Did the patient need help planning regular tasks such as shopping or remembering to take medications? Independent   Patient Information Are you of Hispanic, Latino/a,or Spanish origin?: X. Patient unable to respond, A. No, not of Hispanic, Latino/a, or Spanish origin What is your race?: X. Patient unable to respond, A. White Do you need or want an interpreter to communicate with a doctor or health care staff?: 9. Unable to  respond   Patient's Response To:  Health Literacy and Transportation Is the patient able to respond to health literacy and transportation needs?: No Health Literacy - How often do you need to have someone help you when you read instructions, pamphlets, or other written material from your doctor or pharmacy?: Patient unable to respond (daughter reported pt was able to read medical information with no assistance) In the past 12 months, has lack of transportation kept you from medical appointments or from getting medications?:  (pt's daughter reported that pt has not missed any medical appointments due to transportation issues) In the past 12 months, has lack of transportation kept you from meetings, work, or from getting things needed for daily living?:  (pt's daughter reports that pt has not missed any non-medical appointments due to transportation issues)   Home Assistive Devices / Equipment Home Equipment: None   Prior Device Use: Indicate devices/aids used by the patient prior to current illness, exacerbation or injury? None of the above   Current Functional Level Cognition   Arousal/Alertness: Awake/alert Overall Cognitive Status: Impaired/Different from baseline Orientation Level: Oriented to person, Oriented to place, Disoriented to time, Disoriented to situation    Extremity Assessment (includes Sensation/Coordination)   Upper Extremity Assessment: Right hand dominant, RUE deficits/detail RUE Deficits / Details: grossly 3+/5, stiffness noted at end ROM, elbow pain with AROM limiting further functional assessment RUE: Unable to fully assess due to pain RUE Coordination: decreased fine motor, decreased gross motor  Lower Extremity Assessment: Defer to PT evaluation, RLE deficits/detail RLE Deficits / Details: rigidity in R LE, tremors noted throughout LEs. RLE Coordination: decreased gross motor     ADLs   Overall ADL's : Needs assistance/impaired  Eating/Feeding: Sitting, Maximal  assistance Eating/Feeding Details (indicate cue type and reason): +2 assist to maintain static sitting at EOB with MAX A to support loading spoon and to bring food to mouth using LUE. Pt does attempt to grip spoon and scoop bites on her own with poor coordination appreciated and decreased accuracy with bringing hand to mouth. Grooming: Sitting, Wash/dry face, Maximal assistance Grooming Details (indicate cue type and reason): +2 assist to maintain static sitting at EOB. MAX A to bring hand to face this date. Caregiver at bedside provides cueing for pt to wash face. Pt noted to have some difficulty with this task, dabs more at face with cloth vs. using a washing motion. Appears more limited by cognition this date. Lower Body Bathing: Total assistance, Bed level Lower Body Bathing Details (indicate cue type and reason): TOTAL A to don bilat socks at bed level. Upper Body Dressing : Sitting, Moderate assistance, Minimal assistance Upper Body Dressing Details (indicate cue type and reason): educated in hemi techniques Functional mobility during ADLs: +2 for physical assistance, Total assistance General ADL Comments: MAX A hand over hand face washing using non-dominant L hand. MAX A don B socks in sitting, +2 for trunk control. MAX A don/doff gown in sitting     Mobility   Overal bed mobility: Needs Assistance Bed Mobility: Supine to Sit Rolling: Mod assist Supine to sit: Contact guard, HOB elevated, Used rails Sit to supine: Max assist General bed mobility comments: increased effort, time to complete with VC for bed rail use coming to the R side of the bed     Transfers   Overall transfer level: Needs assistance Equipment used: 1 person hand held assist Transfers: Sit to/from Stand, Bed to chair/wheelchair/BSC Sit to Stand: Contact guard assist Bed to/from chair/wheelchair/BSC transfer type:: Step pivot Stand pivot transfers: Min assist Step pivot transfers: Min assist  Lateral/Scoot  Transfers: Total assist, +2 physical assistance General transfer comment: RUE supported, R knee blocked for standing     Ambulation / Gait / Stairs / Wheelchair Mobility   Ambulation/Gait Ambulation/Gait assistance: Min assist, Contact guard assist Gait Distance (Feet):  (62ft twice, and third time 61ft) General Gait Details: able to use L rail, ambulate several bouts with CGA-minA, progressed to ambulated with bilateral UE support, CGA. very close guard for potential R knee buckling Pre-gait activities: emphasis on posture and increased standing tolerance. standing tolerance of around 30 seconds     Posture / Balance Dynamic Sitting Balance Sitting balance - Comments: able to weight shift on BSC Balance Overall balance assessment: Needs assistance Sitting-balance support: Feet supported, Single extremity supported Sitting balance-Leahy Scale: Good Sitting balance - Comments: able to weight shift on BSC Postural control: Right lateral lean, Posterior lean Standing balance support: Single extremity supported Standing balance-Leahy Scale: Fair Standing balance comment: reliant on bedrail on L side for support but progressing to being able to stand with CGA (R knee blocked for safety). intermittent posterior lean noted     Special needs/care consideration Skin Ecchymosis: buttocks/bilateral; Erythema/Redness: back/left; Surgical Incision: head/left; Bladder incontinence and G-tube    Previous Home Environment (from acute therapy documentation) Living Arrangements: Alone Available Help at Discharge: Family, Available 24 hours/day Type of Home: Mobile home Home Layout: One level Home Access: Stairs to enter Entrance Stairs-Rails: Right, Left, Can reach both Entrance Stairs-Number of Steps: 5 Bathroom Shower/Tub: Engineer, manufacturing systems: Standard Bathroom Accessibility: Yes How Accessible: Accessible via walker Home Care Services: No Additional Comments: per sister in law  Discharge Living Setting Plans for Discharge Living Setting: House Type of Home at Discharge: House Discharge Home Layout: One level Discharge Home Access: Stairs to enter Entrance Stairs-Rails: Right, Left, Can reach both Entrance Stairs-Number of Steps: 4-5 Discharge Bathroom Shower/Tub: Tub/shower unit Discharge Bathroom Toilet: Standard Discharge Bathroom Accessibility: Yes How Accessible: Accessible via walker   Social/Family/Support Systems Anticipated Caregiver: [name], daughter Anticipated Caregiver's Contact Information: [phone] Caregiver Availability: 24/7 Discharge Plan Discussed with Primary Caregiver: Yes Is Caregiver In Agreement with Plan?: Yes Does Caregiver/Family have Issues with Lodging/Transportation while Pt is in Rehab?: No   Goals Patient/Family Goal for Rehab: Supervision: PT, Supervision-Min A:OT/ST Expected length of stay: 10-12 days Pt/Family Agrees to Admission and willing to participate: Yes Program Orientation Provided & Reviewed with Pt/Caregiver Including Roles  & Responsibilities: Yes   Decrease burden of Care through IP rehab admission: NA   Possible need for SNF placement upon discharge: Not anticipated   Patient Condition: I have reviewed medical records from [facility], spoken with CSW, and patient and daughter. I discussed via phone for inpatient rehabilitation assessment.  Patient will benefit from ongoing PT, OT, and SLP, can actively participate in 3 hours of therapy a day 5 days of the week, and can make measurable gains during the admission.  Patient will also benefit from the coordinated team approach during an Inpatient Acute Rehabilitation admission.  The patient will receive intensive therapy as well as Rehabilitation physician, nursing, social worker, and care management interventions.  Due to bladder management, safety, skin/wound care, disease management, medication administration, pain management,  and patient education the patient requires 24 hour a day rehabilitation nursing.  The patient is currently Contact Guard A-Min A with mobility and Max A with basic ADLs.  Discharge setting and therapy post discharge at home with home health is anticipated.  Patient has agreed to participate in the Acute Inpatient Rehabilitation Program and will admit today.   Preadmission Screen Completed By:  Domingo Pulse, 07/12/2023 1:23 PM ______________________________________________________________________   Discussed status with Dr. Natale Lay on 07/12/23 at 1:23 PM and received approval for admission today.   Admission Coordinator:  Domingo Pulse, CCC-SLP, time 1:23 PM/Date 07/12/23     Assessment/Plan: Diagnosis: Subdural Hematoma with midline shift Does the need for close, 24 hr/day Medical supervision in concert with the patient's rehab needs make it unreasonable for this patient to be served in a less intensive setting? Yes Co-Morbidities requiring supervision/potential complications: Acute CVA, UTI, Fever, Diarrhea, thrush, ABLA, IDA, PAF, HTN, HLD, anxiety and depression, dysphagia, R shoulder pain Due to bladder management, bowel management, safety, skin/wound care, disease management, medication administration, pain management, and patient education, does the patient require 24 hr/day rehab nursing? Yes Does the patient require coordinated care of a physician, rehab nurse, PT, OT, and SLP to address physical and functional deficits in the context of the above medical diagnosis(es)? Yes Addressing deficits in the following areas: balance, endurance, locomotion, strength, transferring, bowel/bladder control, bathing, dressing, feeding, grooming, toileting, cognition, speech, language, swallowing, and psychosocial support Can the patient actively participate in an intensive therapy program of at least 3 hrs of therapy 5 days a week? Yes The potential for patient to make measurable  gains while on inpatient rehab is excellent Anticipated functional outcomes upon discharge from inpatient rehab: supervision PT, supervision and min assist OT, supervision and min assist SLP Estimated rehab length of stay to reach the above functional goals is: 10-12 days Anticipated discharge destination: Home 10.  Overall Rehab/Functional Prognosis: excellent     MD Signature: Fanny Dance

## 2023-07-12 NOTE — Evaluation (Signed)
 Occupational Therapy Re-Evaluation Patient Details Name: [name] MRN: [id number] DOB: 1951-04-11 Today's Date: 07/12/2023   History of Present Illness   Pt is a 73 y.o female with significant PMH of PAF, IBS, diverticulosis, DVT on Eliquis, HTN, IDA, HLD, OSA, anxiety and depression who presented to the ED with chief complaints of severe headache. Admitted with traumatic subdural hematoma s/p craniotomy for evacuation on 06/25/2023 and now s/p G-tube placement on 07/05/23, also noted for acute infarcts per imaging.     Clinical Impressions Pt seen for OT re-evaluation. Pt eager to participate, family present and supportive. Pt shares with OT that she's able to speak better today. Pt demonstrating great progression since initial evaluation. Pt RUE noted with improved AROM shoulder 3-/5, elbow 3/5, grip 3+/5 with some stiffness at end ROM and pain with weightbearing through RUE and with elbow flexion. Pt required MIN A for STS and step pivot to recliner with RUE supported. VC for hand placement prior to sitting to improve safety. Educated in hemi techniques for UB dressing requiring MIN-MOD A to complete. Pt eager to improve and motivated throughout. Recommendation updated given significant progress and excellent potential for further functional return.     If plan is discharge home, recommend the following:   A lot of help with walking and/or transfers;A lot of help with bathing/dressing/bathroom;Assist for transportation;Help with stairs or ramp for entrance;Assistance with cooking/housework     Functional Status Assessment   Patient has had a recent decline in their functional status and demonstrates the ability to make significant improvements in function in a reasonable and predictable amount of time.     Equipment Recommendations   Other (comment) (defer)     Recommendations for Other Services         Precautions/Restrictions   Precautions Precautions: Fall Recall  of Precautions/Restrictions: Impaired Restrictions Weight Bearing Restrictions Per Provider Order: No Other Position/Activity Restrictions: LLQ G-tube placed 07/05/23     Mobility Bed Mobility Overal bed mobility: Needs Assistance Bed Mobility: Supine to Sit     Supine to sit: Contact guard, HOB elevated, Used rails     General bed mobility comments: increased effort, time to complete with VC for bed rail use coming to the R side of the bed    Transfers Overall transfer level: Needs assistance Equipment used: 1 person hand held assist Transfers: Sit to/from Stand, Bed to chair/wheelchair/BSC Sit to Stand: Contact guard assist     Step pivot transfers: Min assist     General transfer comment: RUE supported, R knee blocked for standing      Balance Overall balance assessment: Needs assistance Sitting-balance support: Feet supported, Single extremity supported Sitting balance-Leahy Scale: Good     Standing balance support: Single extremity supported Standing balance-Leahy Scale: Fair                             ADL either performed or assessed with clinical judgement   ADL Overall ADL's : Needs assistance/impaired                 Upper Body Dressing : Sitting;Moderate assistance;Minimal assistance Upper Body Dressing Details (indicate cue type and reason): educated in hemi techniques                         Vision         Perception         Praxis  Pertinent Vitals/Pain Pain Assessment Pain Assessment: 0-10 Pain Score: 6  Pain Location: R elbow with WBing and AROM Pain Descriptors / Indicators: Aching, Moaning, Guarding, Grimacing Pain Intervention(s): Limited activity within patient's tolerance, Monitored during session, Repositioned, Patient requesting pain meds-RN notified     Extremity/Trunk Assessment Upper Extremity Assessment Upper Extremity Assessment: Right hand dominant;RUE deficits/detail RUE Deficits /  Details: grossly 3+/5, stiffness noted at end ROM, elbow pain with AROM limiting further functional assessment RUE: Unable to fully assess due to pain RUE Coordination: decreased fine motor;decreased gross motor   Lower Extremity Assessment Lower Extremity Assessment: Defer to PT evaluation;RLE deficits/detail       Communication Communication Communication: Impaired Factors Affecting Communication: Difficulty expressing self   Cognition Arousal: Alert Behavior During Therapy: WFL for tasks assessed/performed Cognition: Cognition impaired   Orientation impairments: Situation Awareness: Intellectual awareness intact, Online awareness intact   Attention impairment (select first level of impairment): Selective attention   OT - Cognition Comments: cognition and communication both improving today                 Following commands: Intact Following commands impaired: Follows one step commands with increased time     Cueing  General Comments   Cueing Techniques: Verbal cues;Tactile cues;Visual cues;Gestural cues      Exercises     Shoulder Instructions      Home Living Family/patient expects to be discharged to:: Private residence Living Arrangements: Alone Available Help at Discharge: Family;Available 24 hours/day Type of Home: Mobile home Home Access: Stairs to enter Entrance Stairs-Number of Steps: 5 Entrance Stairs-Rails: Right;Left;Can reach both Home Layout: One level     Bathroom Shower/Tub: Chief Strategy Officer: Standard Bathroom Accessibility: Yes How Accessible: Accessible via walker Home Equipment: None   Additional Comments: per sister in law      Prior Functioning/Environment Prior Level of Function : Independent/Modified Independent;Driving             Mobility Comments: independent prior to admission ADLs Comments: independent    OT Problem List: Decreased strength;Decreased range of motion;Decreased activity  tolerance;Impaired balance (sitting and/or standing);Decreased cognition;Decreased safety awareness   OT Treatment/Interventions: Self-care/ADL training;Therapeutic exercise;Energy conservation;DME and/or AE instruction;Therapeutic activities;Patient/family education;Balance training      OT Goals(Current goals can be found in the care plan section)   Acute Rehab OT Goals Patient Stated Goal: get better OT Goal Formulation: With patient/family Time For Goal Achievement: 07/26/23 Potential to Achieve Goals: Good   OT Frequency:  Min 1X/week    Co-evaluation              AM-PAC OT "6 Clicks" Daily Activity     Outcome Measure Help from another person eating meals?: None Help from another person taking care of personal grooming?: A Little Help from another person toileting, which includes using toliet, bedpan, or urinal?: A Lot Help from another person bathing (including washing, rinsing, drying)?: A Lot Help from another person to put on and taking off regular upper body clothing?: A Lot Help from another person to put on and taking off regular lower body clothing?: A Lot 6 Click Score: 15   End of Session Equipment Utilized During Treatment: Gait belt Nurse Communication: Patient requests pain meds  Activity Tolerance: Patient tolerated treatment well Patient left: in chair;with call bell/phone within reach;with chair alarm set;with family/visitor present  OT Visit Diagnosis: Other abnormalities of gait and mobility (R26.89);Muscle weakness (generalized) (M62.81)  Time: 6578-4696 OT Time Calculation (min): 25 min Charges:  OT General Charges $OT Visit: 1 Visit OT Evaluation $OT Re-eval: 1 Re-eval OT Treatments $Self Care/Home Management : 8-22 mins  Arman Filter., MPH, MS, OTR/L ascom [phone] 07/12/23, 1:30 PM

## 2023-07-12 NOTE — Progress Notes (Signed)
 Patient ID: [name], female   DOB: 08/15/1950, 73 y.o.   MRN: [id number] Met with the patient to review current medical situation, rehab process, team conference and plan of care. Discussed dysphagia, dysarthria, and management of secondary risks including PAF on Eliquis, emotional changes post CVA, HTN on torpol and HLD with vitamin D deficiency.  Reviewed crani and post seizure prophylaxis with Keppra. Active My Chart account. Continue to follow along to address educational needs to facilitate preparation for discharge home. Pamelia Hoit

## 2023-07-13 DIAGNOSIS — R4701 Aphasia: Secondary | ICD-10-CM | POA: Diagnosis not present

## 2023-07-13 DIAGNOSIS — Z86718 Personal history of other venous thrombosis and embolism: Secondary | ICD-10-CM

## 2023-07-13 DIAGNOSIS — R1314 Dysphagia, pharyngoesophageal phase: Secondary | ICD-10-CM | POA: Diagnosis not present

## 2023-07-13 DIAGNOSIS — S065XAA Traumatic subdural hemorrhage with loss of consciousness status unknown, initial encounter: Secondary | ICD-10-CM

## 2023-07-13 LAB — COMPREHENSIVE METABOLIC PANEL
ALT: 66 U/L — ABNORMAL HIGH (ref 0–44)
AST: 43 U/L — ABNORMAL HIGH (ref 15–41)
Albumin: 3.5 g/dL (ref 3.5–5.0)
Alkaline Phosphatase: 132 U/L — ABNORMAL HIGH (ref 38–126)
Anion gap: 11 (ref 5–15)
BUN: 14 mg/dL (ref 8–23)
CO2: 23 mmol/L (ref 22–32)
Calcium: 9.8 mg/dL (ref 8.9–10.3)
Chloride: 100 mmol/L (ref 98–111)
Creatinine, Ser: 0.72 mg/dL (ref 0.44–1.00)
GFR, Estimated: >60 mL/min (ref >60)
Glucose, Bld: 93 mg/dL (ref 70–99)
Potassium: 4.2 mmol/L (ref 3.5–5.1)
Sodium: 134 mmol/L — ABNORMAL LOW (ref 135–145)
Total Bilirubin: 1 mg/dL (ref 0.0–1.2)
Total Protein: 6.9 g/dL (ref 6.5–8.1)

## 2023-07-13 LAB — CBC WITH DIFFERENTIAL/PLATELET
Abs Immature Granulocytes: 0.07 10*3/uL (ref 0.00–0.07)
Basophils Absolute: 0.1 10*3/uL (ref 0.0–0.1)
Basophils Relative: 1 %
Eosinophils Absolute: 0.3 10*3/uL (ref 0.0–0.5)
Eosinophils Relative: 3 %
HCT: 38.9 % (ref 36.0–46.0)
Hemoglobin: 13.3 g/dL (ref 12.0–15.0)
Immature Granulocytes: 1 %
Lymphocytes Relative: 40 %
Lymphs Abs: 4.7 10*3/uL — ABNORMAL HIGH (ref 0.7–4.0)
MCH: 31 pg (ref 26.0–34.0)
MCHC: 34.2 g/dL (ref 30.0–36.0)
MCV: 90.7 fL (ref 80.0–100.0)
Monocytes Absolute: 0.9 10*3/uL (ref 0.1–1.0)
Monocytes Relative: 7 %
Neutro Abs: 5.6 10*3/uL (ref 1.7–7.7)
Neutrophils Relative %: 48 %
Platelets: 532 10*3/uL — ABNORMAL HIGH (ref 150–400)
RBC: 4.29 MIL/uL (ref 3.87–5.11)
RDW: 13.7 % (ref 11.5–15.5)
WBC: 11.6 10*3/uL — ABNORMAL HIGH (ref 4.0–10.5)
nRBC: 0 % (ref 0.0–0.2)

## 2023-07-13 LAB — GLUCOSE, CAPILLARY
Glucose-Capillary: 89 mg/dL (ref 70–99)
Glucose-Capillary: 96 mg/dL (ref 70–99)
Glucose-Capillary: 103 mg/dL — ABNORMAL HIGH (ref 70–99)
Glucose-Capillary: 118 mg/dL — ABNORMAL HIGH (ref 70–99)

## 2023-07-13 MED ORDER — FREE WATER
30.0000 mL | Status: DC
  Administered 2023-07-13 – 2023-07-15 (×10): 30 mL

## 2023-07-13 MED ORDER — OSMOLITE 1.5 CAL PO LIQD
780.0000 mL | ORAL | Status: DC
  Administered 2023-07-13 – 2023-07-15 (×3): 780 mL
  Filled 2023-07-13 (×4): qty 1000
  Filled 2023-07-13: qty 948
  Filled 2023-07-13: qty 1000

## 2023-07-13 MED ORDER — VITAMIN D 25 MCG (1000 UNIT) PO TABS
4000.0000 [IU] | ORAL_TABLET | Freq: Two times a day (BID) | ORAL | Status: DC
  Administered 2023-07-13 – 2023-07-14 (×2): 4000 [IU]
  Filled 2023-07-13 (×3): qty 4

## 2023-07-13 MED ORDER — PROSOURCE TF20 ENFIT COMPATIBL EN LIQD
60.0000 mL | Freq: Every day | ENTERAL | Status: DC
  Administered 2023-07-13 – 2023-07-15 (×3): 60 mL
  Filled 2023-07-13 (×3): qty 60

## 2023-07-13 MED ORDER — QUETIAPINE FUMARATE 25 MG PO TABS
12.5000 mg | ORAL_TABLET | Freq: Every day | ORAL | Status: DC
  Administered 2023-07-13 – 2023-07-21 (×9): 12.5 mg via ORAL
  Filled 2023-07-13 (×9): qty 1

## 2023-07-13 NOTE — Evaluation (Addendum)
 Speech Language Pathology Assessment and Plan  Patient Details  Name: [name] MRN: [id number] Date of Birth: [birth year]  SLP Diagnosis: Aphasia;Dysphagia;Cognitive Impairments;Apraxia  Rehab Potential: Excellent ELOS: 2 weeks    Today's Date: 07/13/2023 SLP Individual Time: 1300-1400 SLP Individual Time Calculation (min): 60 min   Hospital Problem: Principal Problem:   Subdural hematoma (HCC)  Past Medical History:  Past Medical History:  Diagnosis Date   Bradycardia    Chest pain    Constipation    Depression    Diverticulitis    Diverticulosis of colon    DOE (dyspnea on exertion)    DVT (deep venous thrombosis) (HCC)    Esophageal stricture    Hyperlipemia    Hypertension    IBS (irritable bowel syndrome)    Sleep apnea    Past Surgical History:  Past Surgical History:  Procedure Laterality Date   APPENDECTOMY     BLOOD CLOT  2004   RIGHT LEG   CHOLECYSTECTOMY     COLON SURGERY     CRANIOTOMY Left 06/25/2023   Procedure: CRANIOTOMY HEMATOMA EVACUATION SUBDURAL;  Surgeon: Venetia Night, MD;  Location: [facility] ORS;  Service: Neurosurgery;  Laterality: Left;   GALLBLADDER SURGERY  2003   IR GASTROSTOMY TUBE MOD SED  07/05/2023   NISSEN FUNDOPLICATION     PLANTAR FASCIA SURGERY  2007   sigmoid colectomy  10/21/2010   pelvic anastomosis   VAGINAL HYSTERECTOMY      Assessment / Plan / Recommendation Clinical Impression HPI: Pt is a 73 year old female with medical hx significant for: HTN, hyperlipidemia, IBS, anxiety/depression, diverticulitis, OSA, PAF, DVT on Eliquis. Pt presented to [facility] on 06/25/23 with complaint of headaches. Pt had an unwitnessed fall 4 days prior to presentation. Pt hit back of head and was complaining of posterior headache for several days but refused to seek care. Pt was screaming in pain upon arrival but became suddenly quiet and had stiffening of arms and gaze to left side. Pt given 1mg  of IV Ativan and  taken to CT. cT revealed large left SDH and resulting midline shift. Pt had possible seizure episode in triage. Given IV Keppra. Neurosurgery consulted. Pt Hgb was 6.1. Pt given 2 units of PRBC. Pt underwent emergent left-sided craniotomy for evacuation of subdural hematoma by Dr. Myer Haff on 06/25/23. Repeat CT on 2/16 which showed no evidence of recurrent SDH or stroke. Neurology consulted after possible seizure noted on 06/28/23.MRI was negative for acute changes. EEG on 06/28/23 was suggestive of cortical dysfunction arising from left temporal region likely secondary to SDH/cranioplasty.No seizures were seen. UA positive on 2/21. Started on IV ceftriaxone. Peg tube placed by IR on 07/05/23. Repeat CT was stable. 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 MRI of right shoulder showed rotator cuff tear.   Clinical Impression:  Bedside Swallow Evaluation: A bedside swallow evaluation was completed to assess for s/sx of oropharyngeal dysphagia. Oral mechanism exam functional for mastication and swallowing. POs administered included thin liquids via cup/straw, purees and solids. Patient with functional mastication and adequate oral clearance. No immediate s/sx of aspiration noted during all consistencies, though occasional delayed throat clears after PO intake completed. Patient and daughter report this is b/l and present throughout the day. Recommend upgrade to D3/thin (meds whole in thin) w/ intermittent supervision and use of compensatory strategies. Patient with history of GI issues and need for esophageal dilations, therefore recommend sitting upright during meals and for 30-60 minutes  after.  Cognitive-Linguistic: Patient presents with moderate expressive language deficits. Patient with functional receptive language characterized by 100% accuracy during yes/no questions and following 1-3 step commands. Expressive language is remarkable for semantic/phonemic paraphasias and mild  apraxia. Semantic paraphasias present during confrontational naming tasks (blue/gray for black). Phonemic paraphasias vs apraxia present throughout conversation and automatic speech tasks (counting, months). Patient occasionally required cues where to begin rote task (started in middle of year/week). Recommend continued evaluation of cognition as language improves, however informally observed deficits in [facility] and emergent awareness.  Pt would benefit from skilled ST services to maximize communication, cognition and dysphagia in order to maximize functional independence at d/c. Anticipate patient will require 24 hour supervision at d/c and f/u SLP services.    Skilled Therapeutic Interventions          Patient evaluated using a non-standardized cognitive linguistic assessment and bedside swallow evaluation to assess current cognitive, communicative and swallowing function. See above for details.    SLP Assessment  Patient will need skilled Speech Lanaguage Pathology Services during CIR admission    Recommendations  Recommended Consults: Consider GI evaluation SLP Diet Recommendations: Dysphagia 3 (Mech soft);Thin Liquid Administration via: Cup;Straw Medication Administration: Whole meds with liquid Supervision: Intermittent supervision to cue for compensatory strategies;Staff to assist with self feeding Compensations: Minimize environmental distractions;Slow rate;Small sips/bites Postural Changes and/or Swallow Maneuvers: Out of bed for meals;Seated upright 90 degrees;Upright 30-60 min after meal Oral Care Recommendations: Oral care BID Patient destination: Home Follow up Recommendations: Home Health SLP;Outpatient SLP    SLP Frequency 3 to 5 out of 7 days   SLP Duration  SLP Intensity  SLP Treatment/Interventions 2 weeks  Minumum of 1-2 x/day, 30 to 90 minutes  Cognitive remediation/compensation;Dysphagia/aspiration precaution training;Speech/Language facilitation;Cueing  hierarchy;Therapeutic Activities;Functional tasks;Multimodal communication approach;Patient/family education    Pain None reported   SLP Evaluation Cognition Overall Cognitive Status: Impaired/Different from baseline Arousal/Alertness: Awake/alert Orientation Level: Oriented X4 Year: 2025 Month: March Day of Week: Incorrect Attention: Focused Focused Attention: Appears intact Selective Attention: Impaired Selective Attention Impairment: Functional complex Memory: Impaired Memory Impairment: Retrieval deficit;Storage deficit Awareness: Impaired Awareness Impairment: Emergent impairment Problem Solving: Impaired Problem Solving Impairment: Verbal complex;Functional complex Behaviors: Poor frustration tolerance Safety/Judgment: Impaired  Comprehension Auditory Comprehension Overall Auditory Comprehension: Appears within functional limits for tasks assessed Yes/No Questions: Within Functional Limits Commands: Within Functional Limits Expression Expression Primary Mode of Expression: Verbal Verbal Expression Overall Verbal Expression: Impaired Initiation: No impairment Automatic Speech: Name;Social Response;Counting;Day of week;Month of year Level of Generative/Spontaneous Verbalization: Conversation Repetition: No impairment Naming: Impairment Responsive: 76-100% accurate Confrontation: Impaired Verbal Errors: Semantic paraphasias;Phonemic paraphasias;Aware of errors Pragmatics: No impairment Written Expression Dominant Hand: Right Oral Motor Oral Motor/Sensory Function Overall Oral Motor/Sensory Function: Within functional limits Motor Speech Overall Motor Speech: Impaired Respiration: Within functional limits Phonation: Normal Resonance: Within functional limits Articulation: Within functional limitis Intelligibility: Intelligible Motor Planning: Impaired Level of Impairment: Phrase Motor Speech Errors: Aware  Care Tool Care Tool Cognition Ability to hear  (with hearing aid or hearing appliances if normally used Ability to hear (with hearing aid or hearing appliances if normally used): 0. Adequate - no difficulty in normal conservation, social interaction, listening to TV   Expression of Ideas and Wants Expression of Ideas and Wants: 3. Some difficulty - exhibits some difficulty with expressing needs and ideas (e.g, some words or finishing thoughts) or speech is not clear   Understanding Verbal and Non-Verbal Content Understanding Verbal and Non-Verbal Content: 3. Usually understands - understands most  conversations, but misses some part/intent of message. Requires cues at times to understand  Memory/Recall Ability Memory/Recall Ability : That he or she is in a hospital/hospital unit;Current season    Bedside Swallowing Assessment General Diet Prior to this Study: Dysphagia 2 (finely chopped);Thin liquids (Level 0) Respiratory Status: Room air Behavior/Cognition: Alert;Cooperative;Pleasant mood Oral Cavity - Dentition: Dentures, top;Dentures, bottom Self-Feeding Abilities: Needs assist Vision: Functional for self-feeding Patient Positioning: Upright in bed Baseline Vocal Quality: Normal Volitional Cough: Strong Volitional Swallow: Able to elicit  Ice Chips Ice chips: Not tested Thin Liquid Thin Liquid: Within functional limits Presentation: Straw;Cup Nectar Thick Nectar Thick Liquid: Not tested Honey Thick Honey Thick Liquid: Not tested Puree Puree: Within functional limits Presentation: Spoon;Self Fed Solid Solid: Impaired Presentation: Self Fed Oral Phase Impairments: Impaired mastication Oral Phase Functional Implications: Impaired mastication BSE Assessment Risk for Aspiration Impact on safety and function: Mild aspiration risk Other Related Risk Factors: History of esophageal-related issues;Cognitive impairment  Short Term Goals: Week 1: SLP Short Term Goal 1 (Week 1): Patient will utilize swallowing compensatory  strategies during consumption of D3/thin diet with min multimodal A SLP Short Term Goal 2 (Week 1): Patient will utilize compensatory strategies to increase word finding given mod multimodal A SLP Short Term Goal 3 (Week 1): Patient will demonstrate awareness of verbal errors given min multimodal A SLP Short Term Goal 4 (Week 1): Patient will recall and utilize memory compensatory aids given min multimodal A  Refer to Care Plan for Long Term Goals  Recommendations for other services: None   Discharge Criteria: Patient will be discharged from SLP if patient refuses treatment 3 consecutive times without medical reason, if treatment goals not met, if there is a change in medical status, if patient makes no progress towards goals or if patient is discharged from hospital.  The above assessment, treatment plan, treatment alternatives and goals were discussed and mutually agreed upon: by patient  Mae Denunzio M.A., CF-SLP 07/13/2023, 3:38 PM

## 2023-07-13 NOTE — Progress Notes (Signed)
 Physical Therapy Assessment and Plan  Patient Details  Name: [name] MRN: [id number] Date of Birth: 06/28/1950  PT Diagnosis: Abnormal posture, Abnormality of gait, Hemiplegia dominant, Impaired cognition, Impaired sensation, and Muscle weakness Rehab Potential: Good ELOS: 10-14   Today's Date: 07/13/2023 PT Individual Time:0803-0901 PT Individual Time Calculation (min): 58 min    Hospital Problem: Principal Problem:   Subdural hematoma (HCC)   Past Medical History:  Past Medical History:  Diagnosis Date   Bradycardia    Chest pain    Constipation    Depression    Diverticulitis    Diverticulosis of colon    DOE (dyspnea on exertion)    DVT (deep venous thrombosis) (HCC)    Esophageal stricture    Hyperlipemia    Hypertension    IBS (irritable bowel syndrome)    Sleep apnea    Past Surgical History:  Past Surgical History:  Procedure Laterality Date   APPENDECTOMY     BLOOD CLOT  2004   RIGHT LEG   CHOLECYSTECTOMY     COLON SURGERY     CRANIOTOMY Left 06/25/2023   Procedure: CRANIOTOMY HEMATOMA EVACUATION SUBDURAL;  Surgeon: Venetia Night, MD;  Location: [facility] ORS;  Service: Neurosurgery;  Laterality: Left;   GALLBLADDER SURGERY  2003   IR GASTROSTOMY TUBE MOD SED  07/05/2023   NISSEN FUNDOPLICATION     PLANTAR FASCIA SURGERY  2007   sigmoid colectomy  10/21/2010   pelvic anastomosis   VAGINAL HYSTERECTOMY      Assessment & Plan Clinical Impression: Patient is a 73 y.o. year old female with history of HTN, DVT- on Eliquis, esophageal stricture s/p dilatation, OSA, depression; who was admitted on 06/25/23 with reports of fall where she struck her head followed by 3-4 days of progressive HA and mental status changes. She has seizure type activity and was screaming in pain in ED. CT head performed revealing large left cerebral SDH measures 1.1 cm with 1.2 cm rightward midline shift with early uncal herniation and mass effect on left lateral ventricular system  with concerns of entrapment. Eliquis reversed with Adexxa and noted to be anemic with Hgb 6. She was evaluated by Dr. Myer Haff and was taken to OR for left craniotomy for evacuation of hematoma and received 2 units PRBC post op. SBP goal < 160. She had increase in intensity and frequency of HA and noted to be creaming and agitated on 02/16. Head CT repeated and showed no reaccumulation of SDH, stable interhemispheric SDH thickness with trace midline shift without entrapment. She required IV fentanyl for pain control and required cardene drip for BP control. She continued to have issues with pain control and repeat CT head 02/19 without significant change.    EEG done due to bouts of agitation and showed cortical dysfunction from left temporal region likely due to SDH and no seizures. She continued to have waxing and waning of MS with RUE weakness and minimal verbal output. She was found to have E coli/ citrobacter UTI on 02/21 and treated with 5 day course of Ceftriaxone. Electrolyte abnormalities supplemented and PEG tube placed 07/05/23 due to poor po intake. She continued to have RUE weakness. NS recommended resuming Eliquis on POD # 14 and may require MMA embolization pending repeat CT head 4 weeks post op. She developed fevers up to 101 on 02/26 and BC/respiratory cultures negative and Urine culture showed >100,000 colonies of enterococcus felt to be contaminant.  She has had right shoulder pain and MRI showed moderate  to high grade partial thickness tear of anterior supraspinatus tendon, partial thickness tear biceps tendon, moderate to severe degenerative change in [facility] joint and mild subacromial/subdeltoid bursitis. Shew as treated for oral thrush with fluconazole.    She has also had issue with PAF and metoprolol has been titrated for rate control. She continued to have waxing and waning of MS and MRI brain repeated on 02/26 revealing small acute cortical infarcts in left frontal and parietal lobes and no  change in bilateral cerebral hemispheric SDH. Dr. Selina Cooley recommended starting IV heparin per stroke protocol  with transition to Eliquis once cleared and right sided impairment due to combination of weakness and neglect.  2 D echo showed EF 55-60% with no wall abnormality and negative bubble study. Mentation has improved with improvement in RUE movement.  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 She was independent PTA and CIR recommended due to functional decline.   Patient currently requires min with mobility secondary to muscle weakness, motor apraxia, decreased coordination, and decreased motor planning, decreased midline orientation, decreased attention to right, and decreased motor planning, and decreased postural control, hemiplegia, and decreased balance strategies.  Prior to hospitalization, patient was independent  with mobility and lived with Alone in a Mobile home home.  Home access is 5Stairs to enter.  Patient will benefit from skilled PT intervention to maximize safe functional mobility, minimize fall risk, and decrease caregiver burden for planned discharge home with 24 hour assist for 2 weeks.  Anticipate patient will benefit from follow up HH at discharge.  PT - End of Session Endurance Deficit: Yes PT Assessment Rehab Potential (ACUTE/IP ONLY): Good PT Barriers to Discharge: Decreased caregiver support;Other (comments) PT Barriers to Discharge Comments: cogntition, daughter only staying for 2 weeks per pt PT Patient demonstrates impairments in the following area(s): Balance;Safety;Sensory;Endurance;Motor;Perception PT Transfers Functional Problem(s): Bed Mobility;Bed to Chair;Car;Furniture PT Locomotion Functional Problem(s): Ambulation;Stairs PT Plan PT Intensity: Minimum of 1-2 x/day ,45 to 90 minutes PT Frequency: 5 out of 7 days PT Duration Estimated Length of Stay:  10-14 PT Treatment/Interventions: Ambulation/gait training;Community reintegration;DME/adaptive equipment instruction;Neuromuscular re-education;Stair training;UE/LE Strength taining/ROM PT Transfers Anticipated Outcome(s): supervision PT Locomotion Anticipated Outcome(s): supervision PT Recommendation Recommendations for Other Services: Speech consult Follow Up Recommendations: Home health PT Patient destination: Home Equipment Recommended: To be determined   PT Evaluation Precautions/Restrictions Precautions Precautions: Fall Restrictions Weight Bearing Restrictions Per Provider Order: No Other Position/Activity Restrictions: LLQ G-tube placed 07/05/23 General   Vital SignsTherapy Vitals Temp: 98.5 F (36.9 C) Pulse Rate: 88 Resp: 18 BP: (!) 135/106 Patient Position (if appropriate): Lying Oxygen Therapy SpO2: 98 % O2 Device: Room Air Pain Pain Assessment Pain Scale: 0-10 Pain Score: 0-No pain Pain Interference   Home Living/Prior Functioning Home Living Available Help at Discharge: Family;Available 24 hours/day Type of Home: Mobile home Home Access: Stairs to enter Entrance Stairs-Number of Steps: 5 Entrance Stairs-Rails: Right;Left;Can reach both Home Layout: One level Bathroom Shower/Tub: Engineer, manufacturing systems: Handicapped height Bathroom Accessibility: Yes  Lives With: Alone Prior Function Level of Independence: Independent with basic ADLs;Independent with homemaking with ambulation;Independent with gait;Independent with transfers  Able to Take Stairs?: Yes Driving: Yes Vocation: Retired Leisure: Hobbies-yes (Comment) (Fully independent at baseline in ADLs/IADLs, drive, no hx of falls) Vision/Perception  Vision - History Ability to See in Adequate Light: 0 Adequate Perception Perception: Impaired Preception Impairment Details: Inattention/Neglect Perception-Other Comments: intermittent R  UE inattention Praxis Praxis: Impaired Praxis  Impairment Details: Initiation;Motor planning  Cognition Overall Cognitive Status: Impaired/Different from baseline Arousal/Alertness: Awake/alert Orientation Level: Oriented X4 Year: 2025 Month: March Day of Week: Incorrect Attention: Focused Focused Attention: Appears intact Selective Attention: Impaired Selective Attention Impairment: Functional complex Memory: Impaired Memory Impairment: Retrieval deficit;Storage deficit Awareness: Impaired Awareness Impairment: Emergent impairment Problem Solving: Impaired Problem Solving Impairment: Verbal complex;Functional complex Behaviors: Poor frustration tolerance Safety/Judgment: Impaired Sensation Sensation Light Touch: Impaired Detail Light Touch Impaired Details: Impaired RLE;Impaired RUE Hot/Cold: Not tested Proprioception: Impaired Detail Proprioception Impaired Details: Impaired RUE Stereognosis: Not tested Coordination Gross Motor Movements are Fluid and Coordinated: No Fine Motor Movements are Fluid and Coordinated: No Finger Nose Finger Test: LUE WFL, difficult to asses RUE d/t motor deficits and co-contraction of RLE when attempting with RUE Motor  Motor Motor: Hemiplegia Motor - Skilled Clinical Observations: difficult to determine RUE/LE tone vs muscle guarding d/t cognitive impairments, global R sided weakness and decreased motor planning   Trunk/Postural Assessment  Cervical Assessment Cervical Assessment: Within Functional Limits Thoracic Assessment Thoracic Assessment: Exceptions to [facility] (rounded shoulders) Lumbar Assessment Lumbar Assessment: Exceptions to [facility] (posterior pelvic tilt) Postural Control Postural Control: Deficits on evaluation Trunk Control: singificant posterior lean in standing Righting Reactions: delayed Protective Responses: delayed  Balance Balance Balance Assessed: Yes Static Sitting Balance Static Sitting - Balance Support: Feet supported;Bilateral upper extremity supported Static  Sitting - Level of Assistance: 5: Stand by assistance Dynamic Sitting Balance Dynamic Sitting - Balance Support: Feet supported Dynamic Sitting - Level of Assistance: 5: Stand by assistance Static Standing Balance Static Standing - Balance Support: During functional activity;Bilateral upper extremity supported Static Standing - Level of Assistance: 4: Min assist Dynamic Standing Balance Dynamic Standing - Balance Support: Bilateral upper extremity supported Dynamic Standing - Level of Assistance: 4: Min assist Extremity Assessment  RUE Assessment RUE Assessment: Exceptions to [facility] Active Range of Motion (AROM) Comments: shoulder flexion limited to 90 degrees General Strength Comments: deltoid 3-/5, bicep/tricep 3/5, wrist flexion/extension 3-/5, grip 2+/5 LUE Assessment LUE Assessment: Within Functional Limits      Care Tool Care Tool Bed Mobility Roll left and right activity   Roll left and right assist level: Supervision/Verbal cueing    Sit to lying activity   Sit to lying assist level: Minimal Assistance - Patient > 75%    Lying to sitting on side of bed activity   Lying to sitting on side of bed assist level: the ability to move from lying on the back to sitting on the side of the bed with no back support.: Minimal Assistance - Patient > 75%     Care Tool Transfers Sit to stand transfer   Sit to stand assist level: Minimal Assistance - Patient > 75%    Chair/bed transfer   Chair/bed transfer assist level: Minimal Assistance - Patient > 75%    Car transfer Car transfer activity did not occur: Safety/medical concerns (d/t pt fatigue and cognition)        Care Tool Locomotion Ambulation   Assist level: Contact Guard/Touching assist Assistive device: Walker-rolling Max distance: 75  Walk 10 feet activity   Assist level: Contact Guard/Touching assist Assistive device: Walker-rolling   Walk 50 feet with 2 turns activity   Assist level: Contact Guard/Touching  assist Assistive device: Walker-rolling  Walk 150 feet activity Walk 150 feet activity did not occur: Safety/medical concerns (d/t pt fatigue and cognition)      Walk 10 feet on uneven surfaces activity Walk  10 feet on uneven surfaces activity did not occur: Safety/medical concerns (d/t pt fatigue and cognition)      Stairs   Assist level: Minimal Assistance - Patient > 75% Stairs assistive device: 2 hand rails Max number of stairs: 4  Walk up/down 1 step activity   Walk up/down 1 step (curb) assist level: Minimal Assistance - Patient > 75% Walk up/down 1 step or curb assistive device: 2 hand rails  Walk up/down 4 steps activity   Walk up/down 4 steps assist level: Minimal Assistance - Patient > 75% Walk up/down 4 steps assistive device: 2 hand rails  Walk up/down 12 steps activity Walk up/down 12 steps activity did not occur: Safety/medical concerns (d/t pt fatigue and cognition)      Pick up small objects from floor Pick up small object from the floor (from standing position) activity did not occur: Safety/medical concerns (d/t pt fatigue and cognition)      Wheelchair Is the patient using a wheelchair?: Yes Type of Wheelchair: Manual   Wheelchair assist level: Dependent - Patient 0%    Wheel 50 feet with 2 turns activity   Assist Level: Dependent - Patient 0%  Wheel 150 feet activity   Assist Level: Dependent - Patient 0%    Refer to Care Plan for Long Term Goals  SHORT TERM GOAL WEEK 1 PT Short Term Goal 1 (Week 1): Pt will perform bed mobility per home set up with supervision PT Short Term Goal 2 (Week 1): Pt will maintain R arm in safe position with DME 50% of the time without verbal cues PT Short Term Goal 3 (Week 1): Pt will ambulate >150' with LRAD  Recommendations for other services: None   Skilled Therapeutic Intervention Mobility Bed Mobility Bed Mobility: Rolling Left;Left Sidelying to Sit;Sit to Supine Rolling Left: Supervision/Verbal cueing Left  Sidelying to Sit: Minimal Assistance - Patient >75% Sit to Supine: Minimal Assistance - Patient > 75% Transfers Transfers: Sit to Stand;Stand to Sit;Stand Pivot Transfers Sit to Stand: Minimal Assistance - Patient > 75%;Contact Guard/Touching assist Stand to Sit: Contact Guard/Touching assist;Minimal Assistance - Patient > 75% Stand Pivot Transfers: Minimal Assistance - Patient > 75%;Contact Guard/Touching assist Stand Pivot Transfer Details: Verbal cues for technique;Verbal cues for safe use of DME/AE;Tactile cues for placement;Tactile cues for posture Transfer (Assistive device): Rolling walker Locomotion      Evaluation completed (see details above) with patient education regarding purpose of PT evaluation, PT POC and goals, therapy schedule, weekly team meetings, and other CIR information including safety plan and fall risk safety. Pt performed the below functional mobility tasks with the specified levels of skilled cuing and assistance.    Direct handoff from nsg, pt received seated EOB. Pt agreeable to therapy and no c/o pain at this time. Pt maintained sitting balance with feet supported during subjective portion of evaluation with SBA. Pt moved back to supine d/t fatigue with minA and verbal cues for RUE and RLE management.   Stand pivot transfer with L HHA to w/c. Pt dependently transported to hallway for time management. Max verbal cues for hand placement during sit<>stands<>sit with RW and CGA. Pt ambulated ~75' with RW CGA and verbal cues for step length, trunk control, moving COM anteriorly. Pt had gait pattern described above, no evidence of RLE buckling, and required repeated verbal cues to avoid drifting to L side of hallway.   Pt performed x4 stairs with 3 steps leading with LLE ascending and one leading with RLE, using BHR and CGA-minA  for knee block and posterior COM, max verbal cues for technique and moving RUE up hand rail. Descending, pt required minA globally leading with  RLE but had difficulty attending to RUE on railing and required totalA to move RUE. Pt dependently transported back to room and left seated in w/c with pillow propped under RUE and seat belt alarm on. All needs in reach.  Discharge Criteria: Patient will be discharged from PT if patient refuses treatment 3 consecutive times without medical reason, if treatment goals not met, if there is a change in medical status, if patient makes no progress towards goals or if patient is discharged from hospital.  The above assessment, treatment plan, treatment alternatives and goals were discussed and mutually agreed upon: by patient  Collins Scotland 07/13/2023, 4:28 PM

## 2023-07-13 NOTE — Progress Notes (Signed)
 Inpatient Rehabilitation Center Individual Statement of Services  Patient Name:  [name]  Date:  07/13/2023  Welcome to the Inpatient Rehabilitation Center.  Our goal is to provide you with an individualized program based on your diagnosis and situation, designed to meet your specific needs.  With this comprehensive rehabilitation program, you will be expected to participate in at least 3 hours of rehabilitation therapies Monday-Friday, with modified therapy programming on the weekends.  Your rehabilitation program will include the following services:  Physical Therapy (PT), Occupational Therapy (OT), Speech Therapy (ST), 24 hour per day rehabilitation nursing, Neuropsychology, Care Coordinator, Rehabilitation Medicine, Nutrition Services, and Pharmacy Services  Weekly team conferences will be held on Wednesday to discuss your progress.  Your Inpatient Rehabilitation Care Coordinator will talk with you frequently to get your input and to update you on team discussions.  Team conferences with you and your family in attendance may also be held.  Expected length of stay: 10-14 days  Overall anticipated outcome: supervision with cues-CGA-tub  Depending on your progress and recovery, your program may change. Your Inpatient Rehabilitation Care Coordinator will coordinate services and will keep you informed of any changes. Your Inpatient Rehabilitation Care Coordinator's name and contact numbers are listed  below.  The following services may also be recommended but are not provided by the Inpatient Rehabilitation Center:  Driving Evaluations Home Health Rehabiltiation Services Outpatient Rehabilitation Services    Arrangements will be made to provide these services after discharge if needed.  Arrangements include referral to agencies that provide these services.  Your insurance has been verified to be:  [employer]-Medicare Dual complete Your primary doctor is:  Bethann Punches  Pertinent information  will be shared with your doctor and your insurance company.  Inpatient Rehabilitation Care Coordinator:  Dossie Der, [place] [phone] or Luna [place]  Information discussed with and copy given to patient by: [name], 07/13/2023, 9:37 AM

## 2023-07-13 NOTE — Progress Notes (Signed)
 Inpatient Rehabilitation  Patient information reviewed and entered into eRehab system by Feliberto Gottron, M.A., CCC-SLP, Rehab Quality Coordinator.  Information including medical coding, functional ability and quality indicators will be reviewed and updated through discharge.

## 2023-07-13 NOTE — Evaluation (Signed)
 Occupational Therapy Assessment and Plan  Patient Details  Name: [name] MRN: [id number] Date of Birth: 26-Apr-1951  OT Diagnosis: cognitive deficits, hemiplegia affecting dominant side, and muscle weakness (generalized) Rehab Potential: Rehab Potential (ACUTE ONLY): Good ELOS: 2 weeks   Today's Date: 07/13/2023 OT Individual Time: 1105-1200 OT Individual Time Calculation (min): 55 min     Hospital Problem: Principal Problem:   Subdural hematoma (HCC)   Past Medical History:  Past Medical History:  Diagnosis Date   Bradycardia    Chest pain    Constipation    Depression    Diverticulitis    Diverticulosis of colon    DOE (dyspnea on exertion)    DVT (deep venous thrombosis) (HCC)    Esophageal stricture    Hyperlipemia    Hypertension    IBS (irritable bowel syndrome)    Sleep apnea    Past Surgical History:  Past Surgical History:  Procedure Laterality Date   APPENDECTOMY     BLOOD CLOT  2004   RIGHT LEG   CHOLECYSTECTOMY     COLON SURGERY     CRANIOTOMY Left 06/25/2023   Procedure: CRANIOTOMY HEMATOMA EVACUATION SUBDURAL;  Surgeon: Venetia Night, MD;  Location: [facility] ORS;  Service: Neurosurgery;  Laterality: Left;   GALLBLADDER SURGERY  2003   IR GASTROSTOMY TUBE MOD SED  07/05/2023   NISSEN FUNDOPLICATION     PLANTAR FASCIA SURGERY  2007   sigmoid colectomy  10/21/2010   pelvic anastomosis   VAGINAL HYSTERECTOMY      Assessment & Plan Clinical Impression: [name]. [name] is a 73 year old female with history of HTN, DVT- on Eliquis, esophageal stricture s/p dilatation, OSA, depression; who was admitted on 06/25/23 with reports of fall where she struck her head followed by 3-4 days of progressive HA and mental status changes. She has seizure type activity and was screaming in pain in ED. CT head performed revealing large left cerebral SDH measures 1.1 cm with 1.2 cm rightward midline shift with early uncal herniation and mass effect on left lateral  ventricular system with concerns of entrapment. Eliquis reversed with Adexxa and noted to be anemic with Hgb 6. She was evaluated by Dr. Myer Haff and was taken to OR for left craniotomy for evacuation of hematoma and received 2 units PRBC post op. SBP goal < 160. She had increase in intensity and frequency of HA and noted to be creaming and agitated on 02/16. Head CT repeated and showed no reaccumulation of SDH, stable interhemispheric SDH thickness with trace midline shift without entrapment. She required IV fentanyl for pain control and required cardene drip for BP control. She continued to have issues with pain control and repeat CT head 02/19 without significant change.    EEG done due to bouts of agitation and showed cortical dysfunction from left temporal region likely due to SDH and no seizures. She continued to have waxing and waning of MS with RUE weakness and minimal verbal output. She was found to have E coli/ citrobacter UTI on 02/21 and treated with 5 day course of Ceftriaxone. Electrolyte abnormalities supplemented and PEG tube placed 07/05/23 due to poor po intake. She continued to have RUE weakness. NS recommended resuming Eliquis on POD # 14 and may require MMA embolization pending repeat CT head 4 weeks post op. She developed fevers up to 101 on 02/26 and BC/respiratory cultures negative and Urine culture showed >100,000 colonies of enterococcus felt to be contaminant.  She has had right shoulder pain  and MRI showed moderate to high grade partial thickness tear of anterior supraspinatus tendon, partial thickness tear biceps tendon, moderate to severe degenerative change in [facility] joint and mild subacromial/subdeltoid bursitis. Shew as treated for oral thrush with fluconazole.    She has also had issue with PAF and metoprolol has been titrated for rate control. She continued to have waxing and waning of MS and MRI brain repeated on 02/26 revealing small acute cortical infarcts in left frontal and  parietal lobes and no change in bilateral cerebral hemispheric SDH. Dr. Selina Cooley recommended starting IV heparin per stroke protocol  with transition to Eliquis once cleared and right sided impairment due to combination of weakness and neglect.  2 D echo showed EF 55-60% with no wall abnormality and negative bubble study. Mentation has improved with improvement in RUE movement.  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 She was independent PTA  Patient transferred to CIR on 07/12/2023 .    Patient currently requires mod-max with basic self-care skills and IADL secondary to muscle weakness, decreased cardiorespiratoy endurance, impaired timing and sequencing, abnormal tone, unbalanced muscle activation, decreased coordination, and decreased motor planning, decreased attention to right and decreased motor planning, decreased initiation, decreased attention, decreased awareness, decreased problem solving, decreased safety awareness, decreased memory, and delayed processing, central origin, and decreased sitting balance, decreased standing balance, decreased postural control, hemiplegia, and decreased balance strategies.  Prior to hospitalization, patient could complete BADLs and IADLs with independent .  Patient will benefit from skilled intervention to decrease level of assist with basic self-care skills, increase independence with basic self-care skills, and increase level of independence with iADL prior to discharge home with care partner.  Anticipate patient will require 24 hour supervision and follow up home health.  OT - End of Session Activity Tolerance: Decreased this session Endurance Deficit: Yes OT Assessment Rehab Potential (ACUTE ONLY): Good OT Patient demonstrates impairments in the following area(s): Balance;Perception;Behavior;Safety;Cognition;Sensory;Edema;Skin  Integrity;Endurance;Motor;Pain OT Basic ADL's Functional Problem(s): Grooming;Bathing;Toileting;Dressing OT Advanced ADL's Functional Problem(s): Light Housekeeping OT Transfers Functional Problem(s): Toilet;Tub/Shower OT Additional Impairment(s): Fuctional Use of Upper Extremity OT Plan OT Intensity: Minimum of 1-2 x/day, 45 to 90 minutes OT Frequency: 5 out of 7 days OT Duration/Estimated Length of Stay: 2 weeks OT Treatment/Interventions: Balance/vestibular training;Discharge planning;Functional electrical stimulation;Pain management;Self Care/advanced ADL retraining;Therapeutic Activities;UE/LE Coordination activities;Cognitive remediation/compensation;Disease mangement/prevention;Functional mobility training;Patient/family education;Skin care/wound managment;Therapeutic Exercise;Visual/perceptual remediation/compensation;Community reintegration;DME/adaptive equipment instruction;Psychosocial support;Neuromuscular re-education;Splinting/orthotics;UE/LE Strength taining/ROM;Wheelchair propulsion/positioning OT Self Feeding Anticipated Outcome(s): Independent OT Basic Self-Care Anticipated Outcome(s): Supervision-CGA OT Toileting Anticipated Outcome(s): Supervision-CGA OT Bathroom Transfers Anticipated Outcome(s): Supervision OT Recommendation Recommendations for Other Services: Therapeutic Recreation consult Therapeutic Recreation Interventions: Pet therapy;Stress management;Other (comment) (dance) Patient destination: Home Follow Up Recommendations: Home health OT Equipment Recommended: None recommended by OT   OT Evaluation Precautions/Restrictions  Precautions Precautions: Fall Restrictions Weight Bearing Restrictions Per Provider Order: No Other Position/Activity Restrictions: LLQ G-tube placed 07/05/23 General Chart Reviewed: Yes Additional Pertinent History: OSA, depression, R suprinatus tear Family/Caregiver Present: Yes (DTR and son in law) Vital Signs   Pain Pain  Assessment Pain Scale: 0-10 Pain Score: 0-No pain Home Living/Prior Functioning Home Living Family/patient expects to be discharged to:: Private residence Living Arrangements: Alone Available Help at Discharge: Family, Available 24 hours/day Type of Home: Mobile home Home Access: Stairs to enter [employer] of Steps: 5 Entrance Stairs-Rails: Right, Left, Can reach both Home Layout: One level Bathroom  Shower/Tub: Engineer, manufacturing systems: Handicapped height Bathroom Accessibility: Yes  Lives With: Alone IADL History Homemaking Responsibilities: Yes Meal Prep Responsibility: Primary Laundry Responsibility: Primary Cleaning Responsibility: Primary Bill Paying/Finance Responsibility: Primary Shopping Responsibility: Primary Child Care Responsibility: No Current License: Yes Mode of Transportation: Car Occupation: Retired Prior Function Level of Independence: Independent with basic ADLs, Independent with homemaking with ambulation, Independent with gait, Independent with transfers  Able to Take Stairs?: Yes Driving: Yes Vocation: Retired Leisure: Hobbies-yes (Comment) (Fully independent at baseline in ADLs/IADLs, drive, no hx of falls) Vision Baseline Vision/History: 1 Wears glasses Ability to See in Adequate Light: 0 Adequate Patient Visual Report: No change from baseline Vision Assessment?: No apparent visual deficits Perception  Perception: Impaired Perception-Other Comments: intermittent R UE inattention Praxis Praxis: Impaired Praxis Impairment Details: Initiation;Motor planning Cognition Cognition Overall Cognitive Status: Impaired/Different from baseline Arousal/Alertness: Awake/alert Orientation Level: Person;Situation;Place Person: Oriented Place: Oriented Situation: Oriented Memory: Impaired Memory Impairment: Retrieval deficit;Storage deficit Attention: Selective Selective Attention: Impaired Selective Attention Impairment: Functional  complex Awareness: Impaired Awareness Impairment: Emergent impairment Problem Solving: Impaired Problem Solving Impairment: Functional complex Behaviors: Impulsive;Poor frustration tolerance (very mild) Safety/Judgment: Impaired Brief Interview for Mental Status (BIMS) Repetition of Three Words (First Attempt): 3 Temporal Orientation: Year: Correct Temporal Orientation: Month: Accurate within 5 days Temporal Orientation: Day: Correct Recall: "Sock": No, could not recall Recall: "Blue": Yes, no cue required Recall: "Bed": Yes, no cue required BIMS Summary Score: 13 Sensation Sensation Light Touch: Impaired Detail Light Touch Impaired Details: Impaired RLE;Impaired RUE Hot/Cold: Not tested Proprioception: Impaired Detail Proprioception Impaired Details: Impaired RUE Stereognosis: Not tested Coordination Gross Motor Movements are Fluid and Coordinated: No Fine Motor Movements are Fluid and Coordinated: No Finger Nose Finger Test: LUE WFL, difficult to asses RUE d/t motor deficits and co-contraction of RLE when attempting with RUE Motor  Motor Motor: Hemiplegia Motor - Skilled Clinical Observations: difficult to determine RUE/LE tone vs muscle guarding d/t cognitive impairments, global R sided weakness and decreased motor planning  Trunk/Postural Assessment  Cervical Assessment Cervical Assessment: Within Functional Limits Thoracic Assessment Thoracic Assessment: Exceptions to [facility] (rounded shoulders) Lumbar Assessment Lumbar Assessment: Exceptions to [facility] (posterior pelvic tilt) Postural Control Postural Control: Deficits on evaluation Trunk Control: singificant posterior lean in standing Righting Reactions: delayed Protective Responses: delayed  Balance Balance Balance Assessed: Yes Static Sitting Balance Static Sitting - Balance Support: Feet supported;Bilateral upper extremity supported Static Sitting - Level of Assistance: 5: Stand by assistance Dynamic Sitting  Balance Dynamic Sitting - Balance Support: Feet supported Dynamic Sitting - Level of Assistance: 5: Stand by assistance Dynamic Sitting - Balance Activities: Forward lean/weight shifting;Reaching for objects;Lateral lean/weight shifting Static Standing Balance Static Standing - Balance Support: During functional activity;Bilateral upper extremity supported Static Standing - Level of Assistance: 4: Min assist Dynamic Standing Balance Dynamic Standing - Balance Support: Bilateral upper extremity supported Dynamic Standing - Level of Assistance: 4: Min assist Extremity/Trunk Assessment RUE Assessment RUE Assessment: Exceptions to [facility] Active Range of Motion (AROM) Comments: shoulder flexion limited to 90 degrees General Strength Comments: deltoid 3-/5, bicep/tricep 3/5, wrist flexion/extension 3-/5, grip 2+/5 LUE Assessment LUE Assessment: Within Functional Limits  Care Tool Care Tool Self Care Eating   Eating Assist Level: Set up assist    Oral Care    Oral Care Assist Level: Moderate Assistance - Patient 50 - 74%    Bathing   Body parts bathed by patient: Right arm;Chest;Abdomen;Front perineal area;Right upper leg;Left upper leg;Face Body parts bathed by helper: Left arm;Buttocks;Right upper leg;Left  upper leg   Assist Level: Moderate Assistance - Patient 50 - 74%    Upper Body Dressing(including orthotics)   What is the patient wearing?: Pull over shirt   Assist Level: Maximal Assistance - Patient 25 - 49%    Lower Body Dressing (excluding footwear)   What is the patient wearing?: Underwear/pull up;Pants Assist for lower body dressing: Maximal Assistance - Patient 25 - 49%    Putting on/Taking off footwear   What is the patient wearing?: Socks;Shoes Assist for footwear: Total Assistance - Patient < 25%       Care Tool Toileting Toileting activity   Assist for toileting: Maximal Assistance - Patient 25 - 49%     Care Tool Bed Mobility Roll left and right activity    Roll left and right assist level: Supervision/Verbal cueing    Sit to lying activity   Sit to lying assist level: Minimal Assistance - Patient > 75%    Lying to sitting on side of bed activity   Lying to sitting on side of bed assist level: the ability to move from lying on the back to sitting on the side of the bed with no back support.: Minimal Assistance - Patient > 75%     Care Tool Transfers Sit to stand transfer   Sit to stand assist level: Minimal Assistance - Patient > 75%    Chair/bed transfer   Chair/bed transfer assist level: Minimal Assistance - Patient > 75%     Toilet transfer   Assist Level: Minimal Assistance - Patient > 75%     Care Tool Cognition  Expression of Ideas and Wants Expression of Ideas and Wants: 3. Some difficulty - exhibits some difficulty with expressing needs and ideas (e.g, some words or finishing thoughts) or speech is not clear  Understanding Verbal and Non-Verbal Content Understanding Verbal and Non-Verbal Content: 3. Usually understands - understands most conversations, but misses some part/intent of message. Requires cues at times to understand   Memory/Recall Ability Memory/Recall Ability : That he or she is in a hospital/hospital unit   Refer to Care Plan for Long Term Goals  SHORT TERM GOAL WEEK 1 OT Short Term Goal 1 (Week 1): Pt will recall hemi-dressing techniques with min qustioning cues OT Short Term Goal 2 (Week 1): Pt will complete 3/3 toileting tasks min A OT Short Term Goal 3 (Week 1): Pt will utilize R UE as a gross assist during functional tasks with min verbal cues OT Short Term Goal 4 (Week 1): Pt will complete U/LB bathing with min A  Recommendations for other services: Therapeutic Recreation  Pet therapy, Stress management, and Other dance    Skilled Therapeutic Intervention Skilled Therapeutic Interventions/Progress Updates:  1:1 OT evaluation and intervention initiated which education provided on OT role, goals, and  POC. Pt received sitting up in wc with family present in room. Pt presenting to be in good spirits receptive to skilled OT session reporting 0/10 pain- OT offering intermittent rest breaks, repositioning, and therapeutic support to optimize participation in therapy session. Pt complete BADLs at levels listed below this session with increased rest breaks required d/t activity tolerance deficits. Pt presenting to be calm and mildly impulsive when initiating movement, however able to consistent follow verbal cues. Pt presenting with R hemiplegia, balance deficits, and cognitive deficits impacting her independence in BADLs and IADLs. Pt would benefit from continued OT services in IPR setting. Pt was left resting in bed with call bell in reach, bed alarm on, and  all needs met.   ADL ADL Eating: Set up Where Assessed-Eating: Chair Grooming: Moderate assistance Where Assessed-Grooming: Wheelchair Upper Body Bathing: Moderate assistance Where Assessed-Upper Body Bathing: Wheelchair Lower Body Bathing: Moderate assistance Where Assessed-Lower Body Bathing: Wheelchair Upper Body Dressing: Moderate assistance (using hemi-techniques following education) Where Assessed-Upper Body Dressing: Wheelchair Lower Body Dressing: Moderate assistance (using hemi-techniques following education) Where Assessed-Lower Body Dressing: Wheelchair Toileting: Maximal assistance;Moderate assistance Where Assessed-Toileting: Teacher, adult education: Curator Method: Proofreader: Engineer, technical sales: Not assessed Film/video editor: Not assessed Mobility  Bed Mobility Bed Mobility: Rolling Left;Left Sidelying to Sit;Sit to Supine Rolling Left: Supervision/Verbal cueing Left Sidelying to Sit: Minimal Assistance - Patient >75% Sit to Supine: Minimal Assistance - Patient > 75% Transfers Sit to Stand: Minimal Assistance - Patient > 75%;Contact Guard/Touching  assist Stand to Sit: Contact Guard/Touching assist;Minimal Assistance - Patient > 75%   Discharge Criteria: Patient will be discharged from OT if patient refuses treatment 3 consecutive times without medical reason, if treatment goals not met, if there is a change in medical status, if patient makes no progress towards goals or if patient is discharged from hospital.  The above assessment, treatment plan, treatment alternatives and goals were discussed and mutually agreed upon: by patient and by family  Clide Deutscher 07/13/2023, 12:59 PM

## 2023-07-13 NOTE — Plan of Care (Signed)
  Problem: RH BOWEL ELIMINATION Goal: RH STG MANAGE BOWEL WITH ASSISTANCE Description: STG Manage Bowel with Assistance. Outcome: Progressing   Problem: RH BLADDER ELIMINATION Goal: RH STG MANAGE BLADDER WITH ASSISTANCE Description: STG Manage Bladder With Assistance Outcome: Progressing   Problem: RH SAFETY Goal: RH STG ADHERE TO SAFETY PRECAUTIONS W/ASSISTANCE/DEVICE Description: STG Adhere to Safety Precautions With Assistance/Device. Outcome: Progressing   Problem: RH COGNITION-NURSING Goal: RH STG USES MEMORY AIDS/STRATEGIES W/ASSIST TO PROBLEM SOLVE Description: STG Uses Memory Aids/Strategies With Assistance to Problem Solve. Outcome: Progressing   Problem: RH PAIN MANAGEMENT Goal: RH STG PAIN MANAGED AT OR BELOW PT'S PAIN GOAL Outcome: Progressing

## 2023-07-13 NOTE — Progress Notes (Signed)
 Inpatient Rehabilitation Care Coordinator Assessment and Plan Patient Details  Name: [name] MRN: [id number] Date of Birth: Mar 15, 1951  Today's Date: 07/13/2023  Hospital Problems: Principal Problem:   Subdural hematoma [facility])  Past Medical History:  Past Medical History:  Diagnosis Date   Bradycardia    Chest pain    Constipation    Depression    Diverticulitis    Diverticulosis of colon    DOE (dyspnea on exertion)    DVT (deep venous thrombosis) (HCC)    Esophageal stricture    Hyperlipemia    Hypertension    IBS (irritable bowel syndrome)    Sleep apnea    Past Surgical History:  Past Surgical History:  Procedure Laterality Date   APPENDECTOMY     BLOOD CLOT  2004   RIGHT LEG   CHOLECYSTECTOMY     COLON SURGERY     CRANIOTOMY Left 06/25/2023   Procedure: CRANIOTOMY HEMATOMA EVACUATION SUBDURAL;  Surgeon: Venetia Night, MD;  Location: [facility] ORS;  Service: Neurosurgery;  Laterality: Left;   GALLBLADDER SURGERY  2003   IR GASTROSTOMY TUBE MOD SED  07/05/2023   NISSEN FUNDOPLICATION     PLANTAR FASCIA SURGERY  2007   sigmoid colectomy  10/21/2010   pelvic anastomosis   VAGINAL HYSTERECTOMY     Social History:  reports that she has never smoked. She has never used smokeless tobacco. She reports that she does not drink alcohol and does not use drugs.  Family / Support Systems Marital Status: Divorced Patient Roles: Parent, Other (Comment) (sibling/church member) Children: melissa-daughter [phone] Other Supports: Chris-son in-law  [phone]  Rudy-brother [phone] Anticipated Caregiver: [name] Ability/Limitations of Caregiver: Melissa plans to work from home or will take FMLA son in-law will assist also Caregiver Availability: 24/7 Family Dynamics: Close knit family who will provide 24/7 care at discharge. She has friends and church members who are involved and will support her  Social History Preferred language: English Religion: Baptist Cultural  Background: NA Education: HS Health Literacy - How often do you need to have someone help you when you read instructions, pamphlets, or other written material from your doctor or pharmacy?: Never Writes: Yes Employment Status: Retired Marine scientist Issues: NA Guardian/Conservator: None-according to MD pt is capable of making her own decisons while here. Will include daughter due to involved and pt prefers this   Abuse/Neglect Abuse/Neglect Assessment Can Be Completed: Yes Physical Abuse: Denies Verbal Abuse: Denies Sexual Abuse: Denies Exploitation of patient/patient's resources: Denies Self-Neglect: Denies  Patient response to: Social Isolation - How often do you feel lonely or isolated from those around you?: Never  Emotional Status Pt's affect, behavior and adjustment status: Pt is motivated to improve and hopes it is fast. She has always been independent and active and wants to get back to this level. She lived alone and took care of all of her needs Recent Psychosocial Issues: other health issues were managed prior to admission Psychiatric History: Hx-depression and anxiety takes medications for this and finds them helpful. May benefit from seeing neuro-psych while here Substance Abuse History: NA  Patient / Family Perceptions, Expectations & Goals Pt/Family understanding of illness & functional limitations: Pt and daughter along with son in-law are present, all can explain her condition and procedures she has had while being hospitalized. All talk with the MD's involved and feel understand her treatment plan moving forward. Premorbid pt/family roles/activities: Mom, grandmother, church member, home owner, freind, sibling, etc Anticipated changes in roles/activities/participation: resume Pt/family expectations/goals: Pt  states: "I want to get home soon."  Daughter states: " We want her to get the most out of rehab and be as best as she can be before coming  home."  [employer]: None Premorbid Home Care/DME Agencies: None Transportation available at discharge: self now will be family member Is the patient able to respond to transportation needs?: Yes In the past 12 months, has lack of transportation kept you from medical appointments or from getting medications?: No In the past 12 months, has lack of transportation kept you from meetings, work, or from getting things needed for daily living?: No Resource referrals recommended: Neuropsychology  Discharge Planning Living Arrangements: Alone Support Systems: Children, Other relatives, Manufacturing engineer, Psychologist, clinical community Type of Residence: Private residence Insurance Resources: Media planner (specify) ([employer] Med[employer] dual complete) Financial Resources: Restaurant manager, fast food Screen Referred: No Living Expenses: Own Money Management: Patient Does the patient have any problems obtaining your medications?: No Home Management: self Patient/Family Preliminary Plans: Going to daughter's home at discharge so daughter can provide 24/7 care. Pt hopes to get back to her home and be able to meet her needs like she did prior to admission. Daughter and son in-law are present today. Care Coordinator Barriers to Discharge: Insurance for SNF coverage Care Coordinator Anticipated Follow Up Needs: HH/OP  Clinical Impression Pleasant female who is ready to do therapies and get back home, she is aware she will be going to her daughter's home. Daughter plans to work from home or will take FMLA to provide assist to mom. Aware being evaluated and goals being set for stay here.   [name] 07/13/2023, 9:35 AM

## 2023-07-13 NOTE — Plan of Care (Signed)
  Problem: RH Swallowing Goal: LTG Patient will consume least restrictive diet using compensatory strategies with assistance (SLP) Description: LTG:  Patient will consume least restrictive diet using compensatory strategies with assistance (SLP) Flowsheets (Taken 07/13/2023 1517) LTG: Pt Patient will consume least restrictive diet using compensatory strategies with assistance of (SLP): Supervision   Problem: RH Expression Communication Goal: LTG Patient will verbally express basic/complex needs(SLP) Description: LTG:  Patient will verbally express basic/complex needs, wants or ideas with cues  (SLP) Flowsheets (Taken 07/13/2023 1517) LTG: Patient will verbally express basic/complex needs, wants or ideas (SLP): Minimal Assistance - Patient > 75% Goal: LTG Patient will increase word finding of common (SLP) Description: LTG:  Patient will increase word finding of common objects/daily info/abstract thoughts with cues using compensatory strategies (SLP). Flowsheets (Taken 07/13/2023 1517) LTG: Patient will increase word finding of common (SLP): Minimal Assistance - Patient > 75% Patient will use compensatory strategies to increase word finding of: Daily info   Problem: RH Awareness Goal: LTG: Patient will demonstrate awareness during functional activites type of (SLP) Description: LTG: Patient will demonstrate awareness during functional activites type of (SLP) Flowsheets (Taken 07/13/2023 1517) Patient will demonstrate during cognitive/linguistic activities awareness type of: Emergent LTG: Patient will demonstrate awareness during cognitive/linguistic activities with assistance of (SLP): Supervision   Problem: RH Memory Goal: LTG Patient will use memory compensatory aids to (SLP) Description: LTG:  Patient will use memory compensatory aids to recall biographical/new, daily complex information with cues (SLP) Flowsheets (Taken 07/13/2023 1520) LTG: Patient will use memory compensatory aids to (SLP):  Supervision

## 2023-07-13 NOTE — Progress Notes (Signed)
 Initial Nutrition Assessment  DOCUMENTATION CODES:   Not applicable  INTERVENTION:  Start 48 hour calorie count for objective data on pt's po intake  Transition TF to nocturnal feeds- Osmolite 1.5 @65  ml/hr x 12 hours Prosource TF20, 60 ml once daily 30 ml water flushes q4h (180 ml per day). Regimen provides 1250 kcal (provides 83% of minimum estimated needs), 69 g protein (92% of minimum estimated needs), 594 ml of H2O. Total free water 774 ml daily  Magic cup TID with meals, each supplement provides 290 kcal and 9 grams of protein  NUTRITION DIAGNOSIS:   Inadequate oral intake related to poor appetite as evidenced by meal completion < 50%.  GOAL:   Patient will meet greater than or equal to 90% of their needs  MONITOR:   PO intake, Diet advancement, Weight trends  REASON FOR ASSESSMENT:   Consult Assessment of nutrition requirement/status  ASSESSMENT:   Pt admitted for subdural hematoma s/p left craniotomy for evacuation of hematoma 2/14 complicated by seizures and AMS. Pt with PMH of HTN, esophageal stricture, IBS, GERD, diverticulitis, COPD, HLD, s/p laparoscopic hiatal hernia repair and Nissen fundoplication 2007.   2/14- s/p left-sided craniotomy for hematoma evacuation  2/18- NGT TF 2/24- s/p PEG 2/27- s/p- BSE advanced to trials of dysphagia 2 diet with thin liquids 3/3: admit to CIR  Per last RD note, will transition to bolus feeds. Administer bolus if pt consumes less than 50% of meal. Documented meal completions in last 24 hours of 10-50%. Had limited interaction with pt during visit due to pt needing sudden help with using the bathroom and calling the nurse in for assistance. Pt reported that she has not been receiving any tube feedings via PEG since being switched to PRN bolus feeds yesterday.  RD will switch TF to nocturnal feeds to encourage po intake during the day. RD will also order calorie count for pt to assess po intake with no TF during the  day.  Meds: vitamin D3, pepcid BID, dulcolax  Labs: sodium low (134), CBGs 89-110 in last 24 hours  NUTRITION - FOCUSED PHYSICAL EXAM:  -defer to f/u  Diet Order:   Diet Order             DIET DYS 2 Room service appropriate? Yes with Assist; Fluid consistency: Thin  Diet effective now                   EDUCATION NEEDS:   Not appropriate for education at this time  Skin:  Skin Assessment: Reviewed RN Assessment  Last BM:  3/4 type 6  Height:   Ht Readings from Last 1 Encounters:  07/12/23 5\' 1"  (1.549 m)    Weight:   Wt Readings from Last 1 Encounters:  07/13/23 62.9 kg    BMI:  Body mass index is 26.2 kg/m.  Estimated Nutritional Needs:   Kcal:  1500-1700  Protein:  75-85 g  Fluid:  > 1.5 L    Maceo Pro, MS Dietetic Intern

## 2023-07-13 NOTE — Progress Notes (Signed)
 Physical Therapy TBI Note  Patient Details  Name: [name] MRN: [id number] Date of Birth: [birth year]  Today's Date: 07/13/2023 PT Individual Time: 8413-2440 PT Individual Time Calculation (min): 40 min   Short Term Goals: Week 1:  PT Short Term Goal 1 (Week 1): Pt will perform bed mobility per home set up with supervision PT Short Term Goal 2 (Week 1): Pt will maintain R arm in safe position with DME 50% of the time without verbal cues PT Short Term Goal 3 (Week 1): Pt will ambulate >38' with LRAD  Skilled Therapeutic Interventions/Progress Updates:  Ambulation/gait training;Community reintegration;DME/adaptive equipment instruction;Neuromuscular re-education;Stair training;UE/LE Strength taining/ROM;Patient/family education;Psychosocial support;Balance/vestibular training;Discharge planning;Therapeutic Activities;UE/LE Coordination activities;Cognitive remediation/compensation;Disease management/prevention;Functional mobility training;Splinting/orthotics;Therapeutic Exercise;Pain management   Skilled Intervention: Patient supine in bed on entrance to room. Pt's dtr and son-in-law are in room. Patient alert and agreeable to PT session.   Patient with no pain complaint at start of session.  Therapeutic Activity: Bed Mobility: Pt performed supine <> sit with CGA/ MinA for R hemibody but is able to reach longsitting and initiate with no cues or physical assist. Reduced awareness/ attention of RUE>RLE. VC/ tc required for technique and awareness of self. Seated balance with BLE supported on floor is fair/ good.  Transfers: Pt performed sit<>stand transfers throughout session with MinA for balance initially and improving to CGA. Provided vc/ tc for consistent reach out for available furniture/ surface to improve steadiness.   Gait Training:  Pt ambulated ~90 ft using R HHA at elbow d/t R wrist pain with MinA/ CGA. Demonstrated decreased step length/ height, gaze at feet, NBOS, and  hesitancy in step advancement with reduced lateral weight shift. Provided vc/ tc for  level gaze, increasing step length and light facilitation for weight shift with improved quality of gait.   Neuromuscular Re-ed: NMR facilitated during session with focus on standing balance, proprioception, midline orientation. Pt guided in sit<>stand technique with no AD. Educated on scooting forward to edge of seat, posterior foot positioning, forward hip hinge for weight shift and equal push into extension with BLE.   NMR performed for improvements in motor control and coordination, balance, sequencing, judgement, and self confidence/ efficacy in performing all aspects of mobility at highest level of independence.   Patient supine in bed at end of session with brakes locked, bed alarm set, and all needs within reach.  Dtr with few questions re: potential length of stay, daily scheduling, and daily processes in IPR, Answered within scope of practice and dtr with good understanding.  Therapy Documentation Precautions:  Precautions Precautions: Fall Recall of Precautions/Restrictions: Impaired Restrictions Weight Bearing Restrictions Per Provider Order: No Other Position/Activity Restrictions: LLQ G-tube placed 07/05/23  Pain:  No general pain related this session. Pt does have pain with R wrist movement and educated pt on need for nightly wear of WHO and daily wear of wrist cockup splint to improve stability  Agitated Behavior Scale: TBI Observation Details Observation Environment: pt's room Start of observation period - Date: 07/13/23 Start of observation period - Time: 1530 End of observation period - Date: 07/13/23 End of observation period - Time: 1600 Agitated Behavior Scale (DO NOT LEAVE BLANKS) Short attention span, easy distractibility, inability to concentrate: Present to a slight degree Impulsive, impatient, low tolerance for pain or frustration: Present to a slight degree Uncooperative,  resistant to care, demanding: Absent Violent and/or threatening violence toward people or property: Absent Explosive and/or unpredictable anger: Absent Rocking, rubbing, moaning, or other self-stimulating behavior: Absent Pulling at  tubes, restraints, etc.: Absent Wandering from treatment areas: Absent Restlessness, pacing, excessive movement: Absent Repetitive behaviors, motor, and/or verbal: Absent Rapid, loud, or excessive talking: Absent Sudden changes of mood: Absent Easily initiated or excessive crying and/or laughter: Absent Self-abusiveness, physical and/or verbal: Absent Agitated behavior scale total score: 16   Therapy/Group: Individual Therapy  Loel Dubonnet PT, DPT, CSRS 07/13/2023, 5:23 PM

## 2023-07-13 NOTE — Plan of Care (Signed)
  Problem: RH Swallowing Goal: LTG Patient will consume least restrictive diet using compensatory strategies with assistance (SLP) Description: LTG:  Patient will consume least restrictive diet using compensatory strategies with assistance (SLP) Flowsheets (Taken 07/13/2023 1517) LTG: Pt Patient will consume least restrictive diet using compensatory strategies with assistance of (SLP): Supervision   Problem: RH Expression Communication Goal: LTG Patient will verbally express basic/complex needs(SLP) Description: LTG:  Patient will verbally express basic/complex needs, wants or ideas with cues  (SLP) Flowsheets (Taken 07/13/2023 1517) LTG: Patient will verbally express basic/complex needs, wants or ideas (SLP): Minimal Assistance - Patient > 75% Goal: LTG Patient will increase word finding of common (SLP) Description: LTG:  Patient will increase word finding of common objects/daily info/abstract thoughts with cues using compensatory strategies (SLP). Flowsheets (Taken 07/13/2023 1517) LTG: Patient will increase word finding of common (SLP): Minimal Assistance - Patient > 75% Patient will use compensatory strategies to increase word finding of: Daily info   Problem: RH Awareness Goal: LTG: Patient will demonstrate awareness during functional activites type of (SLP) Description: LTG: Patient will demonstrate awareness during functional activites type of (SLP) Flowsheets (Taken 07/13/2023 1517) Patient will demonstrate during cognitive/linguistic activities awareness type of: Emergent LTG: Patient will demonstrate awareness during cognitive/linguistic activities with assistance of (SLP): Supervision

## 2023-07-13 NOTE — Progress Notes (Signed)
 Orthopedic Tech Progress Note Patient Details:  [name] 1950/08/11 [id number]  Patient ID: [name], female   DOB: [birth year], 73 y.o.   MRN: [id number] Order already placed for resting who with patients height and weight provided to [facility] on 03/03. Darleen Crocker 07/13/2023, 7:03 PM

## 2023-07-13 NOTE — Plan of Care (Signed)
 Problem: RH Balance Goal: LTG: Patient will maintain dynamic sitting balance (OT) Description: LTG:  Patient will maintain dynamic sitting balance with assistance during activities of daily living (OT) Flowsheets (Taken 07/13/2023 1349) LTG: Pt will maintain dynamic sitting balance during ADLs with: Independent Goal: LTG Patient will maintain dynamic standing with ADLs (OT) Description: LTG:  Patient will maintain dynamic standing balance with assist during activities of daily living (OT)  Flowsheets (Taken 07/13/2023 1349) LTG: Pt will maintain dynamic standing balance during ADLs with: Supervision/Verbal cueing   Problem: Sit to Stand Goal: LTG:  Patient will perform sit to stand in prep for activites of daily living with assistance level (OT) Description: LTG:  Patient will perform sit to stand in prep for activites of daily living with assistance level (OT) Flowsheets (Taken 07/13/2023 1349) LTG: PT will perform sit to stand in prep for activites of daily living with assistance level: Supervision/Verbal cueing   Problem: RH Grooming Goal: LTG Patient will perform grooming w/assist,cues/equip (OT) Description: LTG: Patient will perform grooming with assist, with/without cues using equipment (OT) Flowsheets (Taken 07/13/2023 1349) LTG: Pt will perform grooming with assistance level of: Supervision/Verbal cueing   Problem: RH Bathing Goal: LTG Patient will bathe all body parts with assist levels (OT) Description: LTG: Patient will bathe all body parts with assist levels (OT) Flowsheets (Taken 07/13/2023 1349) LTG: Pt will perform bathing with assistance level/cueing: Supervision/Verbal cueing   Problem: RH Dressing Goal: LTG Patient will perform upper body dressing (OT) Description: LTG Patient will perform upper body dressing with assist, with/without cues (OT). Flowsheets (Taken 07/13/2023 1349) LTG: Pt will perform upper body dressing with assistance level of: Supervision/Verbal  cueing Goal: LTG Patient will perform lower body dressing w/assist (OT) Description: LTG: Patient will perform lower body dressing with assist, with/without cues in positioning using equipment (OT) Flowsheets (Taken 07/13/2023 1349) LTG: Pt will perform lower body dressing with assistance level of: Supervision/Verbal cueing   Problem: RH Toileting Goal: LTG Patient will perform toileting task (3/3 steps) with assistance level (OT) Description: LTG: Patient will perform toileting task (3/3 steps) with assistance level (OT)  Flowsheets (Taken 07/13/2023 1349) LTG: Pt will perform toileting task (3/3 steps) with assistance level: Supervision/Verbal cueing   Problem: RH Functional Use of Upper Extremity Goal: LTG Patient will use RT/LT upper extremity as a (OT) Description: LTG: Patient will use right/left upper extremity as a stabilizer/gross assist/diminished/nondominant/dominant level with assist, with/without cues during functional activity (OT) Flowsheets (Taken 07/13/2023 1349) LTG: Use of upper extremity in functional activities: RUE as diminished level LTG: Pt will use upper extremity in functional activity with assistance level of: Supervision/Verbal cueing   Problem: RH Light Housekeeping Goal: LTG Patient will perform light housekeeping w/assist (OT) Description: LTG: Patient will perform light housekeeping with assistance, with/without cues (OT). Flowsheets (Taken 07/13/2023 1349) LTG: Pt will perform light housekeeping with assistance level of: Minimal Assistance - Patient > 75%   Problem: RH Toilet Transfers Goal: LTG Patient will perform toilet transfers w/assist (OT) Description: LTG: Patient will perform toilet transfers with assist, with/without cues using equipment (OT) Flowsheets (Taken 07/13/2023 1349) LTG: Pt will perform toilet transfers with assistance level of: Supervision/Verbal cueing   Problem: RH Tub/Shower Transfers Goal: LTG Patient will perform tub/shower transfers  w/assist (OT) Description: LTG: Patient will perform tub/shower transfers with assist, with/without cues using equipment (OT) Flowsheets (Taken 07/13/2023 1349) LTG: Pt will perform tub/shower stall transfers with assistance level of: Contact Guard/Touching assist   Problem: RH Awareness Goal: LTG: Patient will  demonstrate awareness during functional activites type of (OT) Description: LTG: Patient will demonstrate awareness during functional activites type of (OT) Flowsheets (Taken 07/13/2023 1349) Patient will demonstrate awareness during functional activites type of: Emergent LTG: Patient will demonstrate awareness during functional activites type of (OT): Supervision

## 2023-07-13 NOTE — Progress Notes (Signed)
 PROGRESS NOTE   Subjective/Complaints:  Exp aphasia , comprehension is good  ROS- difficult to assess due to aphasia   Objective:   US Carotid Bilateral Result Date: 07/11/2023 CLINICAL DATA:  Stroke EXAM: BILATERAL CAROTID DUPLEX ULTRASOUND TECHNIQUE: Wallace Cullens scale imaging, color Doppler and duplex ultrasound were performed of bilateral carotid and vertebral arteries in the neck. COMPARISON:  None available FINDINGS: Criteria: Quantification of carotid stenosis is based on velocity parameters that correlate the residual internal carotid diameter with NASCET-based stenosis levels, using the diameter of the distal internal carotid lumen as the denominator for stenosis measurement. The following velocity measurements were obtained: RIGHT ICA: 76/15 cm/sec CCA: 84/11 cm/sec SYSTOLIC ICA/CCA RATIO:  0.9 ECA: 119 cm/sec LEFT ICA: 110/18 cm/sec CCA: 87/12 cm/sec SYSTOLIC ICA/CCA RATIO:  1.3 ECA: 130 cm/sec RIGHT CAROTID ARTERY: Minimal atheromatous plaque of the right proximal internal carotid artery. RIGHT VERTEBRAL ARTERY:  Antegrade flow. LEFT CAROTID ARTERY:  No significant atheromatous plaque. LEFT VERTEBRAL ARTERY:  Antegrade flow. IMPRESSION: No significant stenosis of internal carotid arteries. Electronically Signed   By: Acquanetta Belling M.D.   On: 07/11/2023 16:30   Recent Labs    07/12/23 0456 07/13/23 0546  WBC 10.7* 11.6*  HGB 12.9 13.3  HCT 38.1 38.9  PLT 545* 532*   Recent Labs    07/12/23 0456 07/13/23 0546  NA 136 134*  K 4.0 4.2  CL 102 100  CO2 23 23  GLUCOSE 121* 93  BUN 18 14  CREATININE 0.80 0.72  CALCIUM 10.1 9.8    Intake/Output Summary (Last 24 hours) at 07/13/2023 0826 Last data filed at 07/12/2023 1809 Gross per 24 hour  Intake 120 ml  Output 0 ml  Net 120 ml        Physical Exam: Vital Signs Blood pressure (!) 150/59, pulse 84, temperature 98.4 F (36.9 C), temperature source Oral, resp. rate 16,  height 5\' 1"  (1.549 m), weight 62.9 kg, SpO2 98%.   General: No acute distress Mood and affect are appropriate Heart: Regular rate and rhythm no rubs murmurs or extra sounds Lungs: Clear to auscultation, breathing unlabored, no rales or wheezes Abdomen: Positive bowel sounds, soft nontender to palpation, nondistended Extremities: No clubbing, cyanosis, or edema Skin: No evidence of breakdown, no evidence of rash Neurologic: Cranial nerves II through XII intact, motor strength is 5/5 in left and 3/5 right  deltoid, bicep, tricep, grip, 5/5 left and 4/5 right hip flexor, knee extensors, ankle dorsiflexor and plantar flexor Sensory exam normal sensation to light touch  in bilateral upper and lower extremities  Musculoskeletal: pain with overhead reaching R shoulder  No joint swelling   Assessment/Plan: 1. Functional deficits which require 3+ hours per day of interdisciplinary therapy in a comprehensive inpatient rehab setting. Physiatrist is providing close team supervision and 24 hour management of active medical problems listed below. Physiatrist and rehab team continue to assess barriers to discharge/monitor patient progress toward functional and medical goals  Care Tool:  Bathing              Bathing assist       Upper Body Dressing/Undressing Upper body dressing        Upper  body assist      Lower Body Dressing/Undressing Lower body dressing            Lower body assist       Toileting Toileting    Toileting assist       Transfers Chair/bed transfer  Transfers assist           Locomotion Ambulation   Ambulation assist              Walk 10 feet activity   Assist           Walk 50 feet activity   Assist           Walk 150 feet activity   Assist           Walk 10 feet on uneven surface  activity   Assist           Wheelchair     Assist               Wheelchair 50 feet with 2 turns  activity    Assist            Wheelchair 150 feet activity     Assist          Blood pressure (!) 150/59, pulse 84, temperature 98.4 F (36.9 C), temperature source Oral, resp. rate 16, height 5\' 1"  (1.549 m), weight 62.9 kg, SpO2 98%.  Medical Problem List and Plan: 1. 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  Neuro deficits are related to SDH              -patient may shower, cover G tube             -ELOS/Goals: 10-12 days, Sup PT, Sup-Min A OT/ST             -Admit to CIR              -RUE WHO 2.  DVT/A fib/Antithrombotics: -DVT/anticoagulation:  Pharmaceutical: Eliquis resumed on 02/28 POD#14             -antiplatelet therapy:  N/A 3. Headaches/Pain Management: Tylenol prn not fully effective.              --will add low dose Topamax and titrate as needed.  --continue Seroquel with prn trazodone.  4. Mood/Behavior/Sleep: LCSW to follow for evaluation and support.              -antipsychotic agents: On Seroquel to help with sleep.  5. Neuropsych/cognition: This patient is capable of making decisions on her own behalf. 6. Skin/Wound Care: Routine pressure relief measures.  7. Fluids/Electrolytes/Nutrition: Monitor I/O. Labs essentially normal     Latest Ref Rng & Units 07/13/2023    5:46 AM 07/12/2023    4:56 AM 07/11/2023    3:16 AM  BMP  Glucose 70 - 99 mg/dL 93  161  95   BUN 8 - 23 mg/dL 14  18  21    Creatinine 0.44 - 1.00 mg/dL 0.96  0.45  4.09   Sodium 135 - 145 mmol/L 134  136  136   Potassium 3.5 - 5.1 mmol/L 4.2  4.0  4.6   Chloride 98 - 111 mmol/L 100  102  104   CO2 22 - 32 mmol/L 23  23  24    Calcium 8.9 - 10.3 mg/dL 9.8  81.1  9.7  8. SDH s/p crani 2/14: Repeat CT head in 4 weeks --May need MMA embolization in the future per NS.  9. Seizure activity in ED?: On Keppra for seizure prophylaxis. 10. HTN:  Poorly controlled--on metoprolol XL 25mg . SBP goal < 160             --was on Imdur and aldactone PTA 11.  PAF: Monitor HR TID--on metoprolol.              -HR 90s, consider increase metoprolol dose     07/12/2023    7:35 PM 07/12/2023    4:16 PM 07/12/2023    4:02 PM  Vitals with BMI  Height   5\' 1"     Weight   131 lbs 10 oz 131 lbs 10 oz  BMI   24.88 24.88  Systolic 132 143 578  Diastolic 69 78 78  Pulse 96 92 92      12. Urinary retention: Unable to use bed pan and reports problems with urination.             --will order bladder scans, PVR checks. Start treatment for UTI.  13. Enterococcus UTI: UA/UCS positive 02/26 and 03/01--> 100,000 colonies and symptomatic. Prior treatment with ceftriaxone --will start Keflex as well as pyridium for dysuria/hesitancy.              --Needs to get to [facility] to be able to void.  Mild leukocytosis afebrile on Keflex  14. Seizure prophylaxis: Continue Keppra 15. Dysphagia: On dysphagia 2 diet with thin liquids. Will change pills to po route. PEG tube placed 07/05/23. May need to supplement calories with TF if po intake inadequate  Also will monitor fluid intake and supplement VT as well  16. H/o anxiety/Depression: Continue Celexa.  17. ABLA/Hx of IDA:   S/p 2 u PRBC. Resolved     Latest Ref Rng & Units 07/13/2023    5:46 AM 07/12/2023    4:56 AM 07/10/2023   11:57 PM  CBC  WBC 4.0 - 10.5 K/uL 11.6  10.7  10.4   Hemoglobin 12.0 - 15.0 g/dL 46.9  62.9  52.8   Hematocrit 36.0 - 46.0 % 38.9  38.1  37.2   Platelets 150 - 400 K/uL 532  545  497     18.Right RTC tear as well as AC jt OA : Add voltaren gel to help with pain. Has pain with overhead activity         LOS: 1 days A FACE TO FACE EVALUATION WAS PERFORMED  Erick Colace 07/13/2023, 8:26 AM

## 2023-07-14 DIAGNOSIS — R4701 Aphasia: Secondary | ICD-10-CM

## 2023-07-14 DIAGNOSIS — R1314 Dysphagia, pharyngoesophageal phase: Secondary | ICD-10-CM

## 2023-07-14 DIAGNOSIS — Z86718 Personal history of other venous thrombosis and embolism: Secondary | ICD-10-CM | POA: Diagnosis not present

## 2023-07-14 DIAGNOSIS — S065XAA Traumatic subdural hemorrhage with loss of consciousness status unknown, initial encounter: Secondary | ICD-10-CM | POA: Diagnosis not present

## 2023-07-14 LAB — GLUCOSE, CAPILLARY
Glucose-Capillary: 107 mg/dL — ABNORMAL HIGH (ref 70–99)
Glucose-Capillary: 103 mg/dL — ABNORMAL HIGH (ref 70–99)

## 2023-07-14 MED ORDER — VITAMIN D 25 MCG (1000 UNIT) PO TABS
4000.0000 [IU] | ORAL_TABLET | Freq: Two times a day (BID) | ORAL | Status: DC
  Administered 2023-07-14 – 2023-07-24 (×20): 4000 [IU] via ORAL
  Filled 2023-07-14 (×21): qty 4

## 2023-07-14 NOTE — IPOC Note (Addendum)
 Overall Plan of Care [facility]) Patient Details Name: [name] MRN: [id number] DOB: 1951-03-06  Admitting Diagnosis: Subdural hematoma [facility])  Hospital Problems: Principal Problem:   Subdural hematoma (HCC)     Functional Problem List: Nursing Bladder, Pain, Bowel, Safety, Endurance, Medication Management, Nutrition  PT Balance, Safety, Sensory, Endurance, Motor, Perception  OT Balance, Perception, Behavior, Safety, Cognition, Sensory, Edema, Skin Integrity, Endurance, Motor, Pain  SLP Cognition, Linguistic, Nutrition  TR         Basic ADL's: OT Grooming, Bathing, Toileting, Dressing     Advanced  ADL's: OT Light Housekeeping     Transfers: PT Bed Mobility, Bed to Chair, Car, Occupational psychologist, Research scientist (life sciences): PT Ambulation, Stairs     Additional Impairments: OT Fuctional Use of Upper Extremity  SLP Swallowing, Communication, Social Cognition expression Problem Solving, Memory, Awareness  TR      Anticipated Outcomes Item Anticipated Outcome  Self Feeding Independent  Swallowing  supervision   Basic self-care  Supervision-CGA  Toileting  Supervision-CGA   Bathroom Transfers Supervision  Bowel/Bladder  manage bowel w mod I and bladder w toileting  Transfers  supervision  Locomotion  supervision  Communication  minA  Cognition  supervision  Pain  < 4 with prns  Safety/Judgment  manage w cues   Therapy Plan: PT Intensity: Minimum of 1-2 x/day ,45 to 90 minutes PT Frequency: 5 out of 7 days PT Duration Estimated Length of Stay: 10-14 OT Intensity: Minimum of 1-2 x/day, 45 to 90 minutes OT Frequency: 5 out of 7 days OT Duration/Estimated Length of Stay: 2 weeks SLP Intensity: Minumum of 1-2 x/day, 30 to 90 minutes SLP Frequency: 3 to 5 out of 7 days SLP Duration/Estimated Length of Stay: 2 weeks   Team Interventions: Nursing Interventions Patient/Family Education, Pain Management, Dysphagia/Aspiration Precaution Training, Bowel  Management, Disease Management/Prevention, Medication Management, Discharge Planning  PT interventions Ambulation/gait training, Community reintegration, DME/adaptive equipment instruction, Neuromuscular re-education, Stair training, UE/LE Strength taining/ROM, Patient/family education, Psychosocial support, Warden/ranger, Discharge planning, Therapeutic Activities, UE/LE Coordination activities, Cognitive remediation/compensation, Disease management/prevention, Functional mobility training, Splinting/orthotics, Therapeutic Exercise, Pain management  OT Interventions Balance/vestibular training, Discharge planning, Functional electrical stimulation, Pain management, Self Care/advanced ADL retraining, Therapeutic Activities, UE/LE Coordination activities, Cognitive remediation/compensation, Disease mangement/prevention, Functional mobility training, Patient/family education, Skin care/wound managment, Therapeutic Exercise, Visual/perceptual remediation/compensation, Firefighter, Fish farm manager, Psychosocial support, Neuromuscular re-education, Splinting/orthotics, UE/LE Strength taining/ROM, Wheelchair propulsion/positioning  SLP Interventions Cognitive remediation/compensation, Dysphagia/aspiration precaution training, Speech/Language facilitation, Cueing hierarchy, Therapeutic Activities, Functional tasks, Multimodal communication approach, Patient/family education  TR Interventions    SW/CM Interventions Discharge Planning, Psychosocial Support, Patient/Family Education   Barriers to Discharge MD  Incontinence and Nutritional means  Nursing Decreased caregiver support, Home environment access/layout 1 level mobile home 5 ste bil rail solo; dtr to assist at discharge  PT Decreased caregiver support, Other (comments) cogntition, daughter only staying for 2 weeks per pt  OT      SLP      SW Insurance for SNF coverage     Team Discharge  Planning: Destination: PT-Home ,OT- Home , SLP-Home Projected Follow-up: PT-Home health PT, OT-  Home health OT, SLP-Home Health SLP, Outpatient SLP Projected Equipment Needs: PT-To be determined, OT- None recommended by OT, SLP- n/a Equipment Details: PT- , OT-  Patient/family involved in discharge planning: PT- Patient,  OT-Patient, Family member/caregiver, SLP-Patient, Family member/caregiver  MD ELOS: 18-21d Medical Rehab Prognosis:  Good Assessment: The patient has been admitted for CIR  therapies with the diagnosis of Left SDH. The team will be addressing functional mobility, strength, stamina, balance, safety, adaptive techniques and equipment, self-care, bowel and bladder mgt, patient and caregiver education, swallowing and intake. Goals have been set at Sup. Anticipated discharge destination is Home.        See Team Conference Notes for weekly updates to the plan of care

## 2023-07-14 NOTE — Progress Notes (Signed)
 Patient ID: [name], female   DOB: 1950/06/13, 73 y.o.   MRN: [id number]  Met with pt and spoke with daughter via telephone to discuss team conference goals of supervision level and target discharge date of 3/19. Will have daughter come in next week for education and see mom in therapies. She had questions regarding how long would she need 24/7 supervision. Will ned to await progress and will work on discharge needs.

## 2023-07-14 NOTE — Progress Notes (Signed)
 PROGRESS NOTE   Subjective/Complaints:  Exp aphasia , comprehension is good  ROS- difficult to assess due to aphasia   Objective:   No results found.  Recent Labs    07/12/23 0456 07/13/23 0546  WBC 10.7* 11.6*  HGB 12.9 13.3  HCT 38.1 38.9  PLT 545* 532*   Recent Labs    07/12/23 0456 07/13/23 0546  NA 136 134*  K 4.0 4.2  CL 102 100  CO2 23 23  GLUCOSE 121* 93  BUN 18 14  CREATININE 0.80 0.72  CALCIUM 10.1 9.8    Intake/Output Summary (Last 24 hours) at 07/14/2023 0830 Last data filed at 07/13/2023 1800 Gross per 24 hour  Intake 356 ml  Output --  Net 356 ml        Physical Exam: Vital Signs Blood pressure (!) 131/57, pulse 87, temperature 98.1 F (36.7 C), resp. rate 19, height 5\' 1"  (1.549 m), weight 63.4 kg, SpO2 97%.   General: No acute distress Mood and affect are appropriate Heart: Regular rate and rhythm no rubs murmurs or extra sounds Lungs: Clear to auscultation, breathing unlabored, no rales or wheezes Abdomen: Positive bowel sounds, soft nontender to palpation, nondistended Extremities: No clubbing, cyanosis, or edema Skin: No evidence of breakdown, no evidence of rash Neurologic: Cranial nerves II through XII intact, motor strength is 5/5 in left and 3/5 right  deltoid, bicep, tricep, grip, 5/5 left and 4/5 right hip flexor, knee extensors, ankle dorsiflexor and plantar flexor Sensory exam normal sensation to light touch  in bilateral upper and lower extremities  Musculoskeletal: pain with overhead reaching R shoulder  No joint swelling   Assessment/Plan: 1. Functional deficits which require 3+ hours per day of interdisciplinary therapy in a comprehensive inpatient rehab setting. Physiatrist is providing close team supervision and 24 hour management of active medical problems listed below. Physiatrist and rehab team continue to assess barriers to discharge/monitor patient progress  toward functional and medical goals  Care Tool:  Bathing    Body parts bathed by patient: Right arm, Chest, Abdomen, Front perineal area, Right upper leg, Left upper leg, Face   Body parts bathed by helper: Left arm, Buttocks, Right upper leg, Left upper leg     Bathing assist Assist Level: Moderate Assistance - Patient 50 - 74%     Upper Body Dressing/Undressing Upper body dressing   What is the patient wearing?: Pull over shirt    Upper body assist Assist Level: Maximal Assistance - Patient 25 - 49%    Lower Body Dressing/Undressing Lower body dressing      What is the patient wearing?: Underwear/pull up, Pants     Lower body assist Assist for lower body dressing: Maximal Assistance - Patient 25 - 49%     Toileting Toileting    Toileting assist Assist for toileting: Maximal Assistance - Patient 25 - 49%     Transfers Chair/bed transfer  Transfers assist     Chair/bed transfer assist level: Minimal Assistance - Patient > 75%     Locomotion Ambulation   Ambulation assist      Assist level: Contact Guard/Touching assist Assistive device: Walker-rolling Max distance: 75   Walk 10  feet activity   Assist     Assist level: Contact Guard/Touching assist Assistive device: Walker-rolling   Walk 50 feet activity   Assist    Assist level: Contact Guard/Touching assist Assistive device: Walker-rolling    Walk 150 feet activity   Assist Walk 150 feet activity did not occur: Safety/medical concerns (d/t pt fatigue and cognition)         Walk 10 feet on uneven surface  activity   Assist Walk 10 feet on uneven surfaces activity did not occur: Safety/medical concerns (d/t pt fatigue and cognition)         Wheelchair     Assist Is the patient using a wheelchair?: Yes Type of Wheelchair: Manual    Wheelchair assist level: Dependent - Patient 0% Max wheelchair distance: 150    Wheelchair 50 feet with 2 turns  activity    Assist        Assist Level: Dependent - Patient 0%   Wheelchair 150 feet activity     Assist      Assist Level: Dependent - Patient 0%   Blood pressure (!) 131/57, pulse 87, temperature 98.1 F (36.7 C), resp. rate 19, height 5\' 1"  (1.549 m), weight 63.4 kg, SpO2 97%.  Medical Problem List and Plan: 1. 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  Neuro deficits are related to SDH              -patient may shower, cover G tube             -ELOS/Goals: 10-12 days, Sup PT, Sup-Min A OT/ST             -Admit to CIR              -RUE WHO 2.  DVT/A fib/Antithrombotics: -DVT/anticoagulation:  Pharmaceutical: Eliquis resumed on 02/28 POD#14             -antiplatelet therapy:  N/A 3. Headaches/Pain Management: Tylenol prn not fully effective.              --will add low dose Topamax and titrate as needed.  --continue Seroquel with prn trazodone.  4. Mood/Behavior/Sleep: LCSW to follow for evaluation and support.              -antipsychotic agents: On Seroquel to help with sleep.  5. Neuropsych/cognition: This patient is capable of making decisions on her own behalf. 6. Skin/Wound Care: Routine pressure relief measures.  7. Fluids/Electrolytes/Nutrition: Monitor I/O. Labs essentially normal     Latest Ref Rng & Units 07/13/2023    5:46 AM 07/12/2023    4:56 AM 07/11/2023    3:16 AM  BMP  Glucose 70 - 99 mg/dL 93  161  95   BUN 8 - 23 mg/dL 14  18  21    Creatinine 0.44 - 1.00 mg/dL 0.96  0.45  4.09   Sodium 135 - 145 mmol/L 134  136  136   Potassium 3.5 - 5.1 mmol/L 4.2  4.0  4.6   Chloride 98 - 111 mmol/L 100  102  104   CO2 22 - 32 mmol/L 23  23  24    Calcium 8.9 - 10.3 mg/dL 9.8  81.1  9.7     8. SDH s/p crani 2/14: Repeat CT head in 4 weeks --May need MMA embolization in the future per NS.  9. Seizure activity in ED?:  On  Keppra for seizure prophylaxis. 10. HTN: Poorly controlled--on metoprolol XL 25mg . SBP goal < 160             --was on Imdur and aldactone PTA 11.  PAF: Monitor HR TID--on metoprolol.              -HR 90s, consider increase metoprolol dose     07/12/2023    7:35 PM 07/12/2023    4:16 PM 07/12/2023    4:02 PM  Vitals with BMI  Height   5\' 1"     Weight   131 lbs 10 oz 131 lbs 10 oz  BMI   24.88 24.88  Systolic 132 143 161  Diastolic 69 78 78  Pulse 96 92 92      12. Urinary retention: Unable to use bed pan and reports problems with urination.             --will order bladder scans, PVR checks. Start treatment for UTI.  13. Enterococcus UTI: UA/UCS positive 02/26 and 03/01--> 100,000 colonies and symptomatic. Prior treatment with ceftriaxone --will start Keflex as well as pyridium for dysuria/hesitancy.              --Needs to get to [facility] to be able to void.  Mild leukocytosis afebrile on Keflex  14. Seizure prophylaxis: Continue Keppra 15. Dysphagia: On dysphagia 2 diet with thin liquids. Will change pills to po route. PEG tube placed 07/05/23. May need to supplement calories with TF if po intake inadequate  Also will monitor fluid intake and supplement VT as well - appreciate dietary assist  16. H/o anxiety/Depression: Continue Celexa.  17. ABLA/Hx of IDA:   S/p 2 u PRBC. Resolved     Latest Ref Rng & Units 07/13/2023    5:46 AM 07/12/2023    4:56 AM 07/10/2023   11:57 PM  CBC  WBC 4.0 - 10.5 K/uL 11.6  10.7  10.4   Hemoglobin 12.0 - 15.0 g/dL 09.6  04.5  40.9   Hematocrit 36.0 - 46.0 % 38.9  38.1  37.2   Platelets 150 - 400 K/uL 532  545  497     18.Right RTC tear as well as AC jt OA : Add voltaren gel to help with pain. Has pain with overhead activity     19.  Pt c/o diarrhea, only 1 stool recorded yesterday, on TF as well as Keflex, no laxatives on board, will monitor     LOS: 2 days A FACE TO FACE EVALUATION WAS PERFORMED  Erick Colace 07/14/2023, 8:30 AM

## 2023-07-14 NOTE — Patient Care Conference (Signed)
 Inpatient RehabilitationTeam Conference and Plan of Care Update Date: 07/14/2023   Time: 10:55 AM    Patient Name: [name]      Medical Record Number: [id number]  Date of Birth: 02-10-1951 Sex: Female         Room/Bed: 4W18C/4W18C-01 Payor Info: Payor: MEDICARE / Plan: MEDICARE PART A AND B / Product Type: *No Product type* /    Admit Date/Time:  07/12/2023  2:50 PM  Primary Diagnosis:  Subdural hematoma [facility])  Hospital Problems: Principal Problem:   Subdural hematoma [facility])    Expected Discharge Date: Expected Discharge Date: 07/28/23  Team Members Present: Physician leading conference: Dr. Claudette Laws Social Worker Present: Dossie Der, LCSW Nurse Present: Chana Bode, RN PT Present: Ralph Leyden, PT OT Present: Mariann Barter, OT SLP Present: Everardo Pacific, SLP PPS Coordinator present : Fae Pippin, SLP     Current Status/Progress Goal Weekly Team Focus  Bowel/Bladder   Continent of bowel and bladder   Maintaincontinence   Encourage the use of PRN medication to facilitate regular bowel movement    Swallow/Nutrition/ Hydration   D3/thin   supervision  tolerance of diet    ADL's   UB BADLs mod-max A, LB BADLs max A, toileting max A, ambulatory toilet/shower transfers min A using RW   superivsion overall   evaluation, family education, R MRE, BADL retraining, activity tolerance // Barriers: dynamic standing balance during BADLs, R UE fucntional use, functional cognition deficits with mild frustration with word finding; She is very motivated and working hard in therapy    Mobility   Bed mobility = MinA, Transfers = MinA, Ambulation = MinA for up to 90 ft, ModA for stairs.   overall supervision  Barriers: decreased standing balance, reduced attn to self, reduced activity tolerance /// Work on: [employer] for R hemibody, standing balance, LOA and improving technique with all mobility, gait training, family education    Communication   expressive language  deficits - aphasia/apraxia   min   use of strategies, multimodal means of communication    Safety/Cognition/ Behavioral Observations  min-mod   supervision A   memory, awareness of deficits and errors in language    Pain   Dinies pain   Remain pain free   Assess pain q shift and PRN    Skin   Intact   Maintain skin integrity  Assess skin every shift and PRN      Discharge Planning:  Home with daughter and son in-law daughter to work from home or take [employer]. Pt hopeful can return back to her home eventually   Team Discussion: Patient post SDH with a craniotomy for evacuation of the hematoma and ? UTI with incontinence. Limited by right wrist pain.Right shoulder stiffness/pain related to a Rotator cuff tear  Mri shows supraspinatous tendon tear, biceps tendon tear, with degenerative changes   Patient on target to meet rehab goals: yes, currently needs min assist for upper body care and mod assist for lower body care with min - mod assist for showering. Needs min assist for transfers using a RW. Able to ambulate up to 42' with HHA and min assist but needs mod assist for steps. Goals for discharge set for supervision overall.  *See Care Plan and progress notes for long and short-term goals.   Revisions to Treatment Plan:  Wrist splint/resting hand splint Aquathermia   Teaching Needs: Safety, medications, dietary modification, transfers, toileting, etc.   Current Barriers to Discharge: Decreased caregiver support and Home enviroment access/layout  Possible Resolutions to Barriers: Family education     Medical Summary Current Status: No pain issues , remains aphasic Right hemiparesis RUE>RLE, has G tube able to take po  Barriers to Discharge: Medical stability;Inadequate Nutritional Intake;Incontinence   Possible Resolutions to [employer]: aphasia,dysphagia but diet upgraded , food from home   Continued Need for Acute Rehabilitation Level of Care: The  patient requires daily medical management by a physician with specialized training in physical medicine and rehabilitation for the following reasons: Direction of a multidisciplinary physical rehabilitation program to maximize functional independence : Yes Medical management of patient stability for increased activity during participation in an intensive rehabilitation regime.: Yes Analysis of laboratory values and/or radiology reports with any subsequent need for medication adjustment and/or medical intervention. : Yes   I attest that I was present, lead the team conference, and concur with the assessment and plan of the team.   Chana Bode B 07/14/2023, 2:24 PM

## 2023-07-14 NOTE — Progress Notes (Incomplete)
 Physical Therapy Session Note  Patient Details  Name: [name] MRN: [id number] Date of Birth: 1950/10/19  Today's Date: 07/14/2023 PT Individual Time: 1102-1208 PT Individual Time Calculation (min): 66 min   Short Term Goals: Week 1:  PT Short Term Goal 1 (Week 1): Pt will perform bed mobility per home set up with supervision PT Short Term Goal 2 (Week 1): Pt will maintain R arm in safe position with DME 50% of the time without verbal cues PT Short Term Goal 3 (Week 1): Pt will ambulate >93' with LRAD  Skilled Therapeutic Interventions/Progress Updates:      Therapy Documentation Precautions:  Precautions Precautions: Fall Recall of Precautions/Restrictions: Impaired Restrictions Weight Bearing Restrictions Per Provider Order: No Other Position/Activity Restrictions: LLQ G-tube placed 07/05/23 General:   Vital Signs: Therapy Vitals Temp: 98.7 F (37.1 C) Temp Source: Oral Pulse Rate: 91 Resp: 17 BP: 127/73 Patient Position (if appropriate): Lying Oxygen Therapy SpO2: 98 % O2 Device: Room Air Pain:   Mobility:   Locomotion :    Trunk/Postural Assessment :    Balance:   Exercises:   Other Treatments:      Therapy/Group: Individual Therapy  Loel Dubonnet PT, DPT, CSRS 07/14/2023, 5:04 PM

## 2023-07-14 NOTE — Progress Notes (Addendum)
 Calorie Count Note  48 hour calorie count ordered.  Diet: Dysphagia 3 Supplements: Magic Cup   Estimated Nutritional Needs:    Kcal:  1500-1700   Protein:  75-85 g   Fluid:  > 1.5 L  3/4: Lunch: no meal tickets retrieved  3/4 Dinner: 335 kcal, 22 g protein  3/5 3/5 breakfast: no meal tickets retrieved  Supplements: 50% of 1 Magic cup. 145 kcal, 4.5 g protein  Total intake: 480 kcal (32% of minimum estimated needs)  26.5 protein (35% of minimum estimated needs)  NUTRITION DIAGNOSIS:    Inadequate oral intake related to poor appetite as evidenced by meal completion < 50%.  -remains applicable   GOAL:    Patient will meet greater than or equal to 90% of their needs   Intervention:  Continue 48 hour calorie count for objective data on pt's po intake   TF nocturnal feeds- Osmolite 1.5 @65  ml/hr x 12 hours Prosource TF20, 60 ml once daily 30 ml water flushes q4h (180 ml per day). Regimen provides 1250 kcal (provides 83% of minimum estimated needs), 69 g protein (92% of minimum estimated needs), 594 ml of H2O. Total free water 774 ml daily   Magic cup TID with meals, each supplement provides 290 kcal and 9 grams of protein    Maceo Pro, MS Dietetic Intern

## 2023-07-14 NOTE — Progress Notes (Signed)
 Speech Language Pathology Daily Session Note  Patient Details  Name: [name] MRN: [id number] Date of Birth: [birth year]  Today's Date: 07/14/2023 SLP Individual Time: 1401-1500 SLP Individual Time Calculation (min): 59 min  Short Term Goals: Week 1: SLP Short Term Goal 1 (Week 1): Patient will utilize swallowing compensatory strategies during consumption of D3/thin diet with min multimodal A SLP Short Term Goal 2 (Week 1): Patient will utilize compensatory strategies to increase word finding given mod multimodal A SLP Short Term Goal 3 (Week 1): Patient will demonstrate awareness of verbal errors given min multimodal A SLP Short Term Goal 4 (Week 1): Patient will recall and utilize memory compensatory aids given min multimodal A  Skilled Therapeutic Interventions: Skilled therapy session focused on communication goals. SLP facilitated session by providing minA during automatic naming tasks. Patient with occasional phonemic errors during numbers, days and months, though no observed perseverations, neologisms nor semantic paraphasias. SLP targeted word finding through semantic feature analysis task. SLP prompted patient to describe/gesture vocabulary words presented in order to communicate. Patient required occasional minA to complete task. Patient recalled family members names at the end of the session (sister, 6 brothers). Patient left in bed with alarm set and call bell in reach. Continue POC.   Pain None reported   Therapy/Group: Individual Therapy  Kaaliyah Kita M.A., CF-SLP 07/14/2023, 7:40 AM

## 2023-07-14 NOTE — Plan of Care (Signed)
  Problem: RH BOWEL ELIMINATION Goal: RH STG MANAGE BOWEL WITH ASSISTANCE Description: STG Manage Bowel with Assistance. Outcome: Progressing   Problem: RH BLADDER ELIMINATION Goal: RH STG MANAGE BLADDER WITH ASSISTANCE Description: STG Manage Bladder With Assistance Outcome: Progressing   Problem: RH SAFETY Goal: RH STG ADHERE TO SAFETY PRECAUTIONS W/ASSISTANCE/DEVICE Description: STG Adhere to Safety Precautions With Assistance/Device. Outcome: Progressing   Problem: RH COGNITION-NURSING Goal: RH STG USES MEMORY AIDS/STRATEGIES W/ASSIST TO PROBLEM SOLVE Description: STG Uses Memory Aids/Strategies With Assistance to Problem Solve. Outcome: Progressing   Problem: RH PAIN MANAGEMENT Goal: RH STG PAIN MANAGED AT OR BELOW PT'S PAIN GOAL Outcome: Progressing

## 2023-07-14 NOTE — Progress Notes (Signed)
 Occupational Therapy TBI Note  Patient Details  Name: [name] MRN: [id number] Date of Birth: Nov 19, 1950  Today's Date: 07/14/2023 OT Individual Time: 0846-1000 OT Individual Time Calculation (min): 74 min    Short Term Goals: Week 1:  OT Short Term Goal 1 (Week 1): Pt will recall hemi-dressing techniques with min qustioning cues OT Short Term Goal 2 (Week 1): Pt will complete 3/3 toileting tasks min A OT Short Term Goal 3 (Week 1): Pt will utilize R UE as a gross assist during functional tasks with min verbal cues OT Short Term Goal 4 (Week 1): Pt will complete U/LB bathing with min A  Skilled Therapeutic Interventions/Progress Updates:     Pt received reclined in bed presenting to be in good spirits receptive to skilled OT session reporting 0/10 pain- OT offering intermittent rest breaks, repositioning, and therapeutic support to optimize participation in therapy session. Pt requesting to take shower. Focus this session BADL retraining with emphasis on safety during functional transfers, R UE functional use, and education on modified techniques to increase safety and independence. Pt transitioned to EOB using bed features with supervision +increased time. Engaged Pt in completing functional mobility to bathroom using RW with min A required for balance and RW management and mod verbal cues for R foot height and R side attention to environment. Pt transferred to toilet using RW and grab bars with min A and doffed pants from waist in standing with min A to bring pants off R hip. Continent void documented in flowsheet with Pt able to complete anterior peri-care with CGA in seated position. Pt able to utilize hemi-dressing technique to doff shirt while seated with supervision. Ambulatory transfer to walk-in shower using RW and grab bars mod A for balance and max verbal cues for technique d/t first learning experience. Provided education on techniques for providing self HOH A for improved  neuromuscular return with Pt demonstrating teach back during bathing tasks as evidence of learning- Pt is very motivated to utilize R UE functionally (utilizing ~20% of time as a stabilizer with verbal cues). UB bathing completed with min A overall and LB bathing completed min A with assistance required to wash buttocks. Pt able to cross B LEs into figure-four position to wash feet and lower B LEs with supervision. Following shower, Pt completed functional mobility back to her room using RW with min A for balance and RW management and verbal cues for safety and RW positioning when turning to sit EOB. Provided education on hemi-dressing techniques with demonstration and verbal education provided. Pt able to donn shirt following education with min A. Pt weaved B LEs into pants by crossing legs into figure-four position with min verbal cues and stood using RW min A to bring pants to waist with assistance required to fully adjust pants over hips. Engaged Pt in completing grooming/hygiene tasks in standing position at sink using RW with education provided on hemi-technique. Pt able to utilize R UE as a stabilizer during activity and brush teeth using L UE with CGA. Pt fatigued at end of session requesting to return to bed. EOB> supine min A. Pt was left resting in bed with call bell in reach, bed alarm on, and all needs met.    Therapy Documentation Precautions:  Precautions Precautions: Fall Recall of Precautions/Restrictions: Impaired Restrictions Weight Bearing Restrictions Per Provider Order: No Other Position/Activity Restrictions: LLQ G-tube placed 07/05/23   Therapy/Group: Individual Therapy  Clide Deutscher 07/14/2023, 9:58 AM

## 2023-07-15 LAB — BASIC METABOLIC PANEL
Anion gap: 10 (ref 5–15)
BUN: 28 mg/dL — ABNORMAL HIGH (ref 8–23)
CO2: 25 mmol/L (ref 22–32)
Calcium: 9.5 mg/dL (ref 8.9–10.3)
Chloride: 101 mmol/L (ref 98–111)
Creatinine, Ser: 0.85 mg/dL (ref 0.44–1.00)
GFR, Estimated: >60 mL/min (ref >60)
Glucose, Bld: 120 mg/dL — ABNORMAL HIGH (ref 70–99)
Potassium: 4.5 mmol/L (ref 3.5–5.1)
Sodium: 136 mmol/L (ref 135–145)

## 2023-07-15 LAB — CBC
HCT: 36.4 % (ref 36.0–46.0)
Hemoglobin: 12.4 g/dL (ref 12.0–15.0)
MCH: 30.8 pg (ref 26.0–34.0)
MCHC: 34.1 g/dL (ref 30.0–36.0)
MCV: 90.5 fL (ref 80.0–100.0)
Platelets: 492 10*3/uL — ABNORMAL HIGH (ref 150–400)
RBC: 4.02 MIL/uL (ref 3.87–5.11)
RDW: 13.6 % (ref 11.5–15.5)
WBC: 11.3 10*3/uL — ABNORMAL HIGH (ref 4.0–10.5)
nRBC: 0 % (ref 0.0–0.2)

## 2023-07-15 LAB — GLUCOSE, CAPILLARY: Glucose-Capillary: 96 mg/dL (ref 70–99)

## 2023-07-15 MED ORDER — FREE WATER
100.0000 mL | Status: DC
  Administered 2023-07-15 – 2023-07-18 (×19): 100 mL

## 2023-07-15 NOTE — Progress Notes (Addendum)
 Physical Therapy TBI Note  Patient Details  Name: [name] MRN: [id number] Date of Birth: 03/17/1951  Today's Date: 07/15/2023 PT Individual Time: 6295-2841, 3244-0102 PT Individual Time Calculation (min): 74 min, 23 min   Short Term Goals: Week 1:  PT Short Term Goal 1 (Week 1): Pt will perform bed mobility per home set up with supervision PT Short Term Goal 2 (Week 1): Pt will maintain R arm in safe position with DME 50% of the time without verbal cues PT Short Term Goal 3 (Week 1): Pt will ambulate >65' with LRAD  Skilled Therapeutic Interventions/Progress Updates:    Session 1: pt received in bed and pt endorses not feeling well d/t limited sleep, but requesting to use bathroom. Pt reports 8/10 R shoulder pain at start, down to 6/10 by mid session, premedicated. Rest and positioning provided as needed. Pt did report feeling tired d/t only sleeping 4 hrs the night before. Discussed need for extra sleep while healing during session and pt plans to take nap afterward.   Donned and doffed shoes/socks with set up this session.  Supine>sit with supervision and bed features. ambulatory transfer to bathroom with RUE HHA and LUE furniture surfing for additional support. supervision  for continent void. Documented in flowsheet. CGA for clothing management. Pt ambulated to sink with LUE support and washed hands and face with CGA-close supervision. Cues for using RUE when able, able to wash face with RUE and increased time, but switched to LUE for last 25% for energy conservation. Min a to assist in shoulder elevation at first, then pt able to maintain without assist.   Pt transported to therapy gym for time management and energy conservation. Pt participated in fine motor using activity of picking up plastic pegs  with RUE and placing on peg board 2 x 5 pegs. First bout placed board a 10 ft away to facilitate functional gait and turns with L HHA throughout. Pt then repeated with peg board placed  x 20 ft away with small table as obstacle. Pt needed to use LUE to assist x 2 pegs this bout but completed with RUE.   Pt then transported to main gym for energy conservation and participated in 3 x 8 stair navigation for strength and dynamic balance. BUE support on hand rails, with cueing and intermittent assist for RUE management to prevent shoulder pain. Pt self selected alternating pattern, but was limited by R calf tightness while descending on that leg, so transitioned to RLE leading but maintained alternating ascending, CGA-min a overall.   Pt returned to room and requested to use the bathroom, did so in same manner as above, returned to bed to rest, was left with all needs in reach and alarm active.   Session 2: pt received in bed and agreeable to therapy. Pt reports shoulder pain improved after nsg applied voltaren gel. Pt reports she was not able to rest/nap after am session and is very fatigued.  Bed mobility with supervision. Pt donned shoes and changed socks with set up.   ambulatory transfer to bathroom for continent bowel and bladder void, supervision for hygiene and CGA for clothing management. Note needed 2 attempts to stand with HHA and 1 LOB in bathroom requiring min a, pt reports this is d/t fatigue.   Pt performed Sit to stand and march x 4 combo,2  x 3 reps reps. Pt with difficulty placing foot in same place after each march (R>L) and required min a for intermittent LOB. Pt also  sat with uncontrolled descent some reps, but was able to self correct verbally and safely control descent when focusing on it.   Pt doffed shoes and donned gripper socks with set up, was left with all needs in reach and alarm active.    Therapy Documentation Precautions:  Precautions Precautions: Fall Recall of Precautions/Restrictions: Impaired Restrictions Weight Bearing Restrictions Per Provider Order: No Other Position/Activity Restrictions: LLQ G-tube placed 07/05/23  Agitated Behavior  Scale: TBI Observation Details Observation Environment: I[facility] Start of observation period - Date: 07/15/23 Start of observation period - Time: 0845 End of observation period - Date: 07/15/23 End of observation period - Time: 1000 Agitated Behavior Scale (DO NOT LEAVE BLANKS) Short attention span, easy distractibility, inability to concentrate: Absent Impulsive, impatient, low tolerance for pain or frustration: Present to a slight degree Uncooperative, resistant to care, demanding: Absent Violent and/or threatening violence toward people or property: Absent Explosive and/or unpredictable anger: Absent Rocking, rubbing, moaning, or other self-stimulating behavior: Absent Pulling at tubes, restraints, etc.: Absent Wandering from treatment areas: Absent Restlessness, pacing, excessive movement: Absent Repetitive behaviors, motor, and/or verbal: Absent Rapid, loud, or excessive talking: Absent Sudden changes of mood: Absent Easily initiated or excessive crying and/or laughter: Absent Self-abusiveness, physical and/or verbal: Absent Agitated behavior scale total score: 15      Therapy/Group: Individual Therapy  Juluis Rainier 07/15/2023, 10:19 AM

## 2023-07-15 NOTE — Progress Notes (Addendum)
 Brief Note  48 hour Calorie count continued  Pt had no meal tickets available from last 2 days of meals, and not available to speak with RD during visit. Will follow up tomorrow to  retrieve any available meal tickets and information from pt.       Maceo Pro, MS Dietetic Intern

## 2023-07-15 NOTE — Progress Notes (Signed)
   07/15/23 0700  Behavioral Plan Guideline  [place] Level X-Purposeful, Appropriate: Modified Independent  Behavior to decrease/eliminate  (none)  Changes to environment  (none)  Interventions  (none)  Recommendations for interactions with patient  (none)  In Attendance at Behavior Plan Meeting  OT;PT;SLP;RN

## 2023-07-15 NOTE — Progress Notes (Signed)
 Physical Therapy TBI Note  Patient Details  Name: [name] MRN: [id number] Date of Birth: 1950-11-04  Today's Date: 07/14/2023 PT Individual Time: 1102-1208 PT Individual Time Calculation (min): 66 min   Short Term Goals: Week 1:  PT Short Term Goal 1 (Week 1): Pt will perform bed mobility per home set up with supervision PT Short Term Goal 2 (Week 1): Pt will maintain R arm in safe position with DME 50% of the time without verbal cues PT Short Term Goal 3 (Week 1): Pt will ambulate >150' with LRAD  Skilled Therapeutic Interventions/Progress Updates:  Patient supine in bed on entrance to room. Patient alert and agreeable to PT session.   Patient with no pain complaint at start of session.  Therapeutic Activity: Bed Mobility: Pt performed supine <> sit with supervision. VC/ tc required for corrective leans to reach upright seated position with effort. Transfers: Pt performed sit<>stand and stand pivot transfers throughout session with MinA for balance. One instance of excessive lean during pivot turning to toilet with inability to produce adequate stepping strategy to correct and requiring ModA to attain balance. Provided vc/ tc for improving technique for transfers throughout and education provided as NMR.  Gait Training:  Pt performed ambulatory transfer to bathroom with need to toilet. Completes with MinA and no AD. At end of session, pt ambulated 220 ft using no AD with overall MinA for balance throughout. Demonstrated variable step lengths with step through gait pattern. Trunk stability is also mildly variable which affects step placement/ length. Minimal pauses during ambulation to focus on standing balance prior to continued gait. Provided vc/ tc for upright posture, slightly forward positioning of shoulders, level gaze, heel-to-toe progression.  Neuromuscular Re-ed: NMR facilitated during session with focus on standing balance, motor control, and midline orientation. Pt guided  in improving biomechanics of rise to stand and descent to sit with focus on positioning to edge of seat, placing feet more posteriorly and producing hip hinge for appropriate forward weight shift over feet. Pt requires continued guidance with NDT tc to ribcage to maintain upright posture to trunk and increasing hip hinge to unweight. Pt is fearful of forward lean and continues to require MinA for appropriate lean, CGA for rise to stand, and then MinA into CGA for standing balance. Pt is able to produce adequate forward lean for descent to sit with guiding cues.   Pt also guided in toe taps to 4" step with MinA and HHA to RUE. Requires multiple attempts for first tap with each LE but then is able to complete with minimal effort throughout. MinA provided for balance.   Pt performs minisquats with focus on slow, controlled movements throughout especially in eccentric movements. Demos lean to R with fatigue. Seated rest break provided between 2 sets of 10reps.   NMR performed for improvements in motor control and coordination, balance, sequencing, judgement, and self confidence/ efficacy in performing all aspects of mobility at highest level of independence.   Patient supine in bed at end of session with brakes locked, bed alarm set, and all needs within reach.   Therapy Documentation Precautions:  Precautions Precautions: Fall Recall of Precautions/Restrictions: Impaired Restrictions Weight Bearing Restrictions Per Provider Order: No Other Position/Activity Restrictions: LLQ G-tube placed 07/05/23  Pain:  No pain complaint this day. Pt does grunt throughout session but relates this is not pain related.   Agitated Behavior Scale: TBI Observation Details Observation Environment: pt's room Start of observation period - Date: 07/14/23 Start of observation period -  Time: 1100 End of observation period - Date: 07/14/23 End of observation period - Time: 1200 Agitated Behavior Scale (DO NOT LEAVE  BLANKS) Short attention span, easy distractibility, inability to concentrate: Absent Impulsive, impatient, low tolerance for pain or frustration: Present to a slight degree Uncooperative, resistant to care, demanding: Absent Violent and/or threatening violence toward people or property: Absent Explosive and/or unpredictable anger: Absent Rocking, rubbing, moaning, or other self-stimulating behavior: Absent Pulling at tubes, restraints, etc.: Absent Wandering from treatment areas: Absent Restlessness, pacing, excessive movement: Absent Repetitive behaviors, motor, and/or verbal: Absent Rapid, loud, or excessive talking: Absent Sudden changes of mood: Absent Easily initiated or excessive crying and/or laughter: Absent Self-abusiveness, physical and/or verbal: Absent Agitated behavior scale total score: 16   Therapy/Group: Individual Therapy  Loel Dubonnet PT, DPT, CSRS 07/14/2023, 6:57 PM

## 2023-07-15 NOTE — Progress Notes (Signed)
 Occupational Therapy TBI Note  Patient Details  Name: [name] MRN: [id number] Date of Birth: 1950/05/26  Today's Date: 07/15/2023 OT Individual Time: 2841-3244 OT Individual Time Calculation (min): 40 min    Short Term Goals: Week 1:  OT Short Term Goal 1 (Week 1): Pt will recall hemi-dressing techniques with min qustioning cues OT Short Term Goal 2 (Week 1): Pt will complete 3/3 toileting tasks min A OT Short Term Goal 3 (Week 1): Pt will utilize R UE as a gross assist during functional tasks with min verbal cues OT Short Term Goal 4 (Week 1): Pt will complete U/LB bathing with min A  Skilled Therapeutic Interventions/Progress Updates:     Pt received reclined in bed with sister in law present in room. Pt presenting to be in good spirits receptive to skilled OT session reporting 3/10 pain in R shoulder- OT offering intermittent rest breaks, repositioning, and therapeutic support to optimize participation in therapy session. Informed RN of Pt's pain with RN in/out during session to provided medications. Pt reporting feeling fatigued, however motivated to participate requesting to stay in room for OT session. Focus this session R UE NMRE. Sitting EOB, engaged Pt in series of R NRME activities to increase overall muscle activation and functional use during BADLs. With OT providing gentle resistance for increased proprioceptive feedback, engaged Pt in completing wrist flexion/extension, elbow flexion/extension bringing hand to mouth, forearm pronation/supination, and scapular protraction/retraction pushing/pulling with Pt able to complete 1x10 reps of each movement with increased rest breaks and mod verbal/tactile cues provided for muscle activation. Pt presenting with improved wrist flexion/extension and bicep strength this session. With cones positioned on her R side, Pt then completed lateral leans onto her R UE and utilized her L UE to reach across midline to retrieve cones and place then on  the ground to her L. Focused activity on facilitating WB'ing through R UE with Pt able to complete 10 reps with light min A required to maintain R UE positioning. To address Pt's targeted reach and dynamic sitting balance deficits, engaged Pt in retrieving cones from the ground using her R UE and placing them onto her R side with crossing midline and R side attention incorporated into task. Pt completed 10 trials with maximal effort and min hand over hand assistance provided to maintain grasp and max verbal cues for voluntary grasp/release. Session progress towards FM coordination and targeted reach activity with Pt tasked with using R UE to retrieve medium sized pegs positioned at shoulder height and transporting them to peg board. Pt presenting with increased shoulder elevation, increased compensatory trunk movements, and compensatory grasp pattern with max verbal cues required to facilitate improved fluidity of movements and avoid compensatory strategies. Pt requested to return to bed at end of session. EOB>supine CGA. Pt was left resting in bed with call bell in reach, bed alarm on, and all needs met.    Therapy Documentation Precautions:  Precautions Precautions: Fall Recall of Precautions/Restrictions: Impaired Restrictions Weight Bearing Restrictions Per Provider Order: No Other Position/Activity Restrictions: LLQ G-tube placed 07/05/23  Agitated Behavior Scale: TBI Observation Details Observation Environment: IPR Start of observation period - Date: 07/15/23 Start of observation period - Time: 0845 End of observation period - Date: 07/15/23 End of observation period - Time: 1000  Agitated Behavior Scale (DO NOT LEAVE BLANKS) Short attention span, easy distractibility, inability to concentrate: Absent Impulsive, impatient, low tolerance for pain or frustration: Present to a slight degree Uncooperative, resistant to care, demanding: Absent Violent  and/or threatening violence toward people  or property: Absent Explosive and/or unpredictable anger: Absent Rocking, rubbing, moaning, or other self-stimulating behavior: Absent Pulling at tubes, restraints, etc.: Absent Wandering from treatment areas: Absent Restlessness, pacing, excessive movement: Absent Repetitive behaviors, motor, and/or verbal: Absent Rapid, loud, or excessive talking: Absent Sudden changes of mood: Absent Easily initiated or excessive crying and/or laughter: Absent Self-abusiveness, physical and/or verbal: Absent Agitated behavior scale total score: 15   Therapy/Group: Individual Therapy  Clide Deutscher 07/15/2023, 12:50 PM

## 2023-07-15 NOTE — Progress Notes (Signed)
 Speech Language Pathology Daily Session Note  Patient Details  Name: [name] MRN: [id number] Date of Birth: 1950-11-06  Today's Date: 07/15/2023 SLP Individual Time: 4098-1191 SLP Individual Time Calculation (min): 45 min  Short Term Goals: Week 1: SLP Short Term Goal 1 (Week 1): Patient will utilize swallowing compensatory strategies during consumption of D3/thin diet with min multimodal A SLP Short Term Goal 2 (Week 1): Patient will utilize compensatory strategies to increase word finding given mod multimodal A SLP Short Term Goal 3 (Week 1): Patient will demonstrate awareness of verbal errors given min multimodal A SLP Short Term Goal 4 (Week 1): Patient will recall and utilize memory compensatory aids given min multimodal A  Skilled Therapeutic Interventions: Skilled therapy session focused on communication and dysphagia goals. Upon entrance, RN administering medication. Patient tolerated well, with no s/sx of aspiration, though did require x1 cue to sit upright. Continue medication administration whole in water. SLP targeted communication goals through verbalization of sentences. Patient required minA to correct phonemic errors and over articulate at a slow rate. SLP targeted word finding through educating patient of synonym strategy. Initially, patient required modA to identify synonyms in sentences, though has activity progressed, required minA. Patient left in bed with alarm set and call bell in reach. Continue POC.  Pain Pain in stomach, MD aware  Therapy/Group: Individual Therapy  Fortino Haag M.A., CF-SLP 07/15/2023, 7:33 AM

## 2023-07-15 NOTE — Progress Notes (Signed)
 PROGRESS NOTE   Subjective/Complaints:  Abd discomfort , mainly midline upper and lower abd, had BMs this am , has urinated no burning with urination , no vomiting   ROS- difficult to assess due to aphasia   Objective:   No results found.  Recent Labs    07/13/23 0546 07/15/23 0605  WBC 11.6* 11.3*  HGB 13.3 12.4  HCT 38.9 36.4  PLT 532* 492*   Recent Labs    07/13/23 0546 07/15/23 0605  NA 134* 136  K 4.2 4.5  CL 100 101  CO2 23 25  GLUCOSE 93 120*  BUN 14 28*  CREATININE 0.72 0.85  CALCIUM 9.8 9.5    Intake/Output Summary (Last 24 hours) at 07/15/2023 0754 Last data filed at 07/14/2023 2200 Gross per 24 hour  Intake 720 ml  Output 2 ml  Net 718 ml        Physical Exam: Vital Signs Blood pressure (!) 155/53, pulse 84, temperature 97.7 F (36.5 C), temperature source Oral, resp. rate 17, height 5\' 1"  (1.549 m), weight 61.5 kg, SpO2 99%.   General: No acute distress Mood and affect are appropriate Heart: Regular rate and rhythm no rubs murmurs or extra sounds Lungs: Clear to auscultation, breathing unlabored, no rales or wheezes Abdomen: Positive bowel sounds, soft nontender to palpation, nondistended Extremities: No clubbing, cyanosis, or edema Skin: No evidence of breakdown, no evidence of rash Neurologic: Cranial nerves II through XII intact, motor strength is 5/5 in left and 3/5 right  deltoid, bicep, tricep, grip, 5/5 left and 4/5 right hip flexor, knee extensors, ankle dorsiflexor and plantar flexor Sensory exam normal sensation to light touch  in bilateral upper and lower extremities  Musculoskeletal: pain with overhead reaching R shoulder  No joint swelling   Assessment/Plan: 1. Functional deficits which require 3+ hours per day of interdisciplinary therapy in a comprehensive inpatient rehab setting. Physiatrist is providing close team supervision and 24 hour management of active medical  problems listed below. Physiatrist and rehab team continue to assess barriers to discharge/monitor patient progress toward functional and medical goals  Care Tool:  Bathing    Body parts bathed by patient: Right arm, Chest, Abdomen, Front perineal area, Right upper leg, Left upper leg, Face   Body parts bathed by helper: Left arm, Buttocks, Right upper leg, Left upper leg     Bathing assist Assist Level: Moderate Assistance - Patient 50 - 74%     Upper Body Dressing/Undressing Upper body dressing   What is the patient wearing?: Pull over shirt    Upper body assist Assist Level: Maximal Assistance - Patient 25 - 49%    Lower Body Dressing/Undressing Lower body dressing      What is the patient wearing?: Underwear/pull up, Pants     Lower body assist Assist for lower body dressing: Maximal Assistance - Patient 25 - 49%     Toileting Toileting    Toileting assist Assist for toileting: Maximal Assistance - Patient 25 - 49%     Transfers Chair/bed transfer  Transfers assist     Chair/bed transfer assist level: Minimal Assistance - Patient > 75%     Locomotion Ambulation  Ambulation assist      Assist level: Contact Guard/Touching assist Assistive device: Walker-rolling Max distance: 75   Walk 10 feet activity   Assist     Assist level: Contact Guard/Touching assist Assistive device: Walker-rolling   Walk 50 feet activity   Assist    Assist level: Contact Guard/Touching assist Assistive device: Walker-rolling    Walk 150 feet activity   Assist Walk 150 feet activity did not occur: Safety/medical concerns (d/t pt fatigue and cognition)         Walk 10 feet on uneven surface  activity   Assist Walk 10 feet on uneven surfaces activity did not occur: Safety/medical concerns (d/t pt fatigue and cognition)         Wheelchair     Assist Is the patient using a wheelchair?: Yes Type of Wheelchair: Manual    Wheelchair assist  level: Dependent - Patient 0% Max wheelchair distance: 150    Wheelchair 50 feet with 2 turns activity    Assist        Assist Level: Dependent - Patient 0%   Wheelchair 150 feet activity     Assist      Assist Level: Dependent - Patient 0%   Blood pressure (!) 155/53, pulse 84, temperature 97.7 F (36.5 C), temperature source Oral, resp. rate 17, height 5\' 1"  (1.549 m), weight 61.5 kg, SpO2 99%.  Medical Problem List and Plan: 1. 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  Neuro deficits are related to SDH              -patient may shower, cover G tube             -ELOS/Goals: 3/19 Sup PT, Sup-Min A OT/ST             -Admit to CIR              -RUE WHO 2.  DVT/A fib/Antithrombotics: -DVT/anticoagulation:  Pharmaceutical: Eliquis resumed on 02/28 POD#14             -antiplatelet therapy:  N/A 3. Headaches/Pain Management: Tylenol prn not fully effective.              --will add low dose Topamax and titrate as needed.  --continue Seroquel with prn trazodone.  4. Mood/Behavior/Sleep: LCSW to follow for evaluation and support.              -antipsychotic agents: On Seroquel to help with sleep.  5. Neuropsych/cognition: This patient is capable of making decisions on her own behalf. 6. Skin/Wound Care: Routine pressure relief measures.  7. Fluids/Electrolytes/Nutrition: Monitor I/O. Labs essentially normal     Latest Ref Rng & Units 07/15/2023    6:05 AM 07/13/2023    5:46 AM 07/12/2023    4:56 AM  BMP  Glucose 70 - 99 mg/dL 962  93  952   BUN 8 - 23 mg/dL 28  14  18    Creatinine 0.44 - 1.00 mg/dL 8.41  3.24  4.01   Sodium 135 - 145 mmol/L 136  134  136   Potassium 3.5 - 5.1 mmol/L 4.5  4.2  4.0   Chloride 98 - 111 mmol/L 101  100  102   CO2 22 - 32 mmol/L 25  23  23    Calcium 8.9 - 10.3 mg/dL 9.5  9.8  02.7   Increase free H20 3/6  elevated BUN  8. SDH s/p crani 2/14: Repeat CT head in 4 weeks --May need MMA embolization in the future per NS.  9. Seizure activity in ED?: On Keppra for seizure prophylaxis. 10. HTN: Poorly controlled--on metoprolol XL 25mg . SBP goal < 160             --was on Imdur and aldactone PTA 11.  PAF: Monitor HR TID--on metoprolol.              -HR 90s, consider increase metoprolol dose     07/12/2023    7:35 PM 07/12/2023    4:16 PM 07/12/2023    4:02 PM  Vitals with BMI  Height   5\' 1"     Weight   131 lbs 10 oz 131 lbs 10 oz  BMI   24.88 24.88  Systolic 132 143 409  Diastolic 69 78 78  Pulse 96 92 92      12. Urinary retention: Unable to use bed pan and reports problems with urination.             --will order bladder scans, PVR checks. Start treatment for UTI.  13. Enterococcus UTI: UA/UCS positive 02/26 and 03/01--> 100,000 colonies and symptomatic. Prior treatment with ceftriaxone --will start Keflex as well as pyridium for dysuria/hesitancy.              --Needs to get to [facility] to be able to void.  Mild leukocytosis afebrile on Keflex  14. Seizure prophylaxis: Continue Keppra 15. Dysphagia: On dysphagia 2 diet with thin liquids. Will change pills to po route. PEG tube placed 07/05/23. May need to supplement calories with TF if po intake inadequate  Also will monitor fluid intake and supplement VT as well - appreciate dietary assist  16. H/o anxiety/Depression: Continue Celexa.  17. ABLA/Hx of IDA:   S/p 2 u PRBC. Resolved     Latest Ref Rng & Units 07/15/2023    6:05 AM 07/13/2023    5:46 AM 07/12/2023    4:56 AM  CBC  WBC 4.0 - 10.5 K/uL 11.3  11.6  10.7   Hemoglobin 12.0 - 15.0 g/dL 81.1  91.4  78.2   Hematocrit 36.0 - 46.0 % 36.4  38.9  38.1   Platelets 150 - 400 K/uL 492  532  545     18.Right RTC tear as well as AC jt OA : Add voltaren gel to help with pain. Has pain with overhead activity     19.  Pt c/o diarrhea, only 1 stool recorded yesterday, on TF as well as Keflex, no  laxatives on board, will monitor     LOS: 3 days A FACE TO FACE EVALUATION WAS PERFORMED  Erick Colace 07/15/2023, 7:54 AM

## 2023-07-16 NOTE — Progress Notes (Signed)
 Physical Therapy TBI Note  Patient Details  Name: [name] MRN: [id number] Date of Birth: [birth year]  Today's Date: 07/16/2023 PT Individual Time: 0865-7846 PT Individual Time Calculation (min): 61 min   Short Term Goals: Week 1:  PT Short Term Goal 1 (Week 1): Pt will perform bed mobility per home set up with supervision PT Short Term Goal 2 (Week 1): Pt will maintain R arm in safe position with DME 50% of the time without verbal cues PT Short Term Goal 3 (Week 1): Pt will ambulate >150' with LRAD  Skilled Therapeutic Interventions/Progress Updates:  Patient supine in bed on entrance to room. Patient alert and agreeable to PT session.   Patient with no pain complaint at start of session. Requests to use bathroom prior to leaving room.   Therapeutic Activity: Bed Mobility: Pt performed supine <> sit with improving coordination, increased effort, and supervision. No vc required for technique.  Transfers: Pt performed sit<>stand and stand pivot transfers throughout session with CGA. Provided vc/ tc for midline orientation and equal WB to R/L sides. Ambulatory transfer to bathroom with no AD and CGA/ minA throughout. Pt is able to doff pants with light CGA for balance. Toilets and performs pericare with supervision. Dons pants with extra time and CGA for balance.   Gait Training:  Pt ambulated 130' x2 using RW per pt's choice with close supervision. Demonstrated light but improving ataxia with decreased but adequate step length/ height. Adjusted walker higher for more upright postural positioning. VC provided for light touch to RW in order to decrease dependency as well as to decrease forward lean and heavy BUE pressure for improved maneuvering.  Neuromuscular Re-ed: NMR facilitated during session with focus on standing balance. Pt guided in toss of beanbags to target board using RUE. Pt demos learning and improving motor planning/ control with improved aim and technique with coordinated  release of grasp with arm swing. Pt has difficulty throughout with grasp of bag from basket and raising arm to appropriate level in order to clear basket. Requires MinA to reach to floor to pick up beanbag and maintain balance. Performs x2 with seated rest break between. Pt guided in use of reacher with LUE in order to collect bags from target board and floor. Requires ModA to use RUE to collect final beanbag.   NMR performed for improvements in motor control and coordination, balance, sequencing, judgement, and self confidence/ efficacy in performing all aspects of mobility at highest level of independence.   Patient supine in bed at end of session with brakes locked, bed alarm set, and all needs within reach.   Therapy Documentation Precautions:  Precautions Precautions: Fall Recall of Precautions/Restrictions: Impaired Restrictions Weight Bearing Restrictions Per Provider Order: No Other Position/Activity Restrictions: LLQ G-tube placed 07/05/23  Pain:  No pain related this session.   Therapy/Group: Individual Therapy  Loel Dubonnet PT, DPT, CSRS 07/16/2023, 12:23 PM

## 2023-07-16 NOTE — Group Note (Signed)
 Patient Details Name: DEAYSIA GRIGORYAN MRN: [id number] DOB: [birth year] Today's Date: 07/16/2023  Time Calculation: OT Group Time Calculation OT Group Start Time: 1100 OT Group Stop Time: 1200 OT Group Time Calculation (min): 60 min    Group Description: Stress management: Pt participated in group session with a focus on stress mgmt, education provided on healthy coping strategies, and social interaction. Focus of session on providing coping strategies to manage new diagnosis to allow for improved mental health to increase overall quality of life . Discussed how to break down stressors into "daily hassles," "major life stressors" and "life circumstances" in an effort to allow pts to chunk their stressors into groups and determine where to best put their efforts/time when dealing with stress. Provided active listening, emotional support and therapeutic use of self. Offered education on factors that protect [place] against stress such as "daily uplifts," "healthy coping strategies" and "protective factors." Encouraged all group members to make an effort to actively recall one event from their day that was a daily uplift in an effort to protect their mindset from stressors as well as sharing this information with their caregivers to facilitate improved caregiver communication and decrease overall burden of care.  Issued pt handouts on healthy coping strategies to implement into routine.   Individual level documentation: Patient participated with full collaboration during session.   Pain: No pain  Barron Schmid 07/16/2023, 12:37 PM

## 2023-07-16 NOTE — Progress Notes (Signed)
 Speech Language Pathology Daily Session Note  Patient Details  Name: DENECE SHEARER MRN: [id number] Date of Birth: 1951/01/18  Today's Date: 07/16/2023 SLP Individual Time: 0900-1000 SLP Individual Time Calculation (min): 60 min  Short Term Goals: Week 1: SLP Short Term Goal 1 (Week 1): Patient will utilize swallowing compensatory strategies during consumption of D3/thin diet with min multimodal A SLP Short Term Goal 2 (Week 1): Patient will utilize compensatory strategies to increase word finding given mod multimodal A SLP Short Term Goal 3 (Week 1): Patient will demonstrate awareness of verbal errors given min multimodal A SLP Short Term Goal 4 (Week 1): Patient will recall and utilize memory compensatory aids given min multimodal A  Skilled Therapeutic Interventions: Skilled therapy session focused on cognitive goals. SLP facilitated session by providing education regarding WRAP (write, repeat, associate, picture) memory strategies. Patient required supervision A to identify examples of each. SLP encouraged use of strategies through paragraph comprehension task. With use of strategies, patient answered comprehension questions after a 5 minute delay with 100% accuracy given minA. During session, patient transferred to hallway due to tornado drill. Patient with 100% speech intelligibility and followed all directions independently during drill. Patient left in bed at conclusion of session with call bell and alarm in place. Continue POC.  Pain None reported  Therapy/Group: Individual Therapy  Yoltzin Ransom M.A., CF-SLP 07/16/2023, 7:32 AM

## 2023-07-16 NOTE — Progress Notes (Signed)
 PROGRESS NOTE   Subjective/Complaints:  Pt states she ate a grilled cheese sandwich and dessert last noc   ROS- difficult to assess due to aphasia   Objective:   No results found.  Recent Labs    07/15/23 0605  WBC 11.3*  HGB 12.4  HCT 36.4  PLT 492*   Recent Labs    07/15/23 0605  NA 136  K 4.5  CL 101  CO2 25  GLUCOSE 120*  BUN 28*  CREATININE 0.85  CALCIUM 9.5    Intake/Output Summary (Last 24 hours) at 07/16/2023 0700 Last data filed at 07/15/2023 1900 Gross per 24 hour  Intake 480 ml  Output --  Net 480 ml        Physical Exam: Vital Signs Blood pressure (!) 151/58, pulse 90, temperature 98.6 F (37 C), resp. rate 16, height 5\' 1"  (1.549 m), weight 61.4 kg, SpO2 99%.   General: No acute distress Mood and affect are appropriate Heart: Regular rate and rhythm no rubs murmurs or extra sounds Lungs: Clear to auscultation, breathing unlabored, no rales or wheezes Abdomen: Positive bowel sounds, soft nontender to palpation, nondistended Extremities: No clubbing, cyanosis, or edema Skin: No evidence of breakdown, no evidence of rash Neurologic: Cranial nerves II through XII intact, motor strength is 5/5 in left and 3/5 right  deltoid, bicep, tricep, grip, 5/5 left and 4/5 right hip flexor, knee extensors, ankle dorsiflexor and plantar flexor Sensory exam normal sensation to light touch  in bilateral upper and lower extremities  Moderate exp aphasia   Musculoskeletal: pain with overhead reaching R shoulder  No joint swelling   Assessment/Plan: 1. Functional deficits which require 3+ hours per day of interdisciplinary therapy in a comprehensive inpatient rehab setting. Physiatrist is providing close team supervision and 24 hour management of active medical problems listed below. Physiatrist and rehab team continue to assess barriers to discharge/monitor patient progress toward functional and medical  goals  Care Tool:  Bathing    Body parts bathed by patient: Right arm, Chest, Abdomen, Front perineal area, Right upper leg, Left upper leg, Face   Body parts bathed by helper: Left arm, Buttocks, Right upper leg, Left upper leg     Bathing assist Assist Level: Moderate Assistance - Patient 50 - 74%     Upper Body Dressing/Undressing Upper body dressing   What is the patient wearing?: Pull over shirt    Upper body assist Assist Level: Maximal Assistance - Patient 25 - 49%    Lower Body Dressing/Undressing Lower body dressing      What is the patient wearing?: Underwear/pull up, Pants     Lower body assist Assist for lower body dressing: Maximal Assistance - Patient 25 - 49%     Toileting Toileting    Toileting assist Assist for toileting: Maximal Assistance - Patient 25 - 49%     Transfers Chair/bed transfer  Transfers assist     Chair/bed transfer assist level: Minimal Assistance - Patient > 75%     Locomotion Ambulation   Ambulation assist      Assist level: Contact Guard/Touching assist Assistive device: Walker-rolling Max distance: 75   Walk 10 feet activity  Assist     Assist level: Contact Guard/Touching assist Assistive device: Walker-rolling   Walk 50 feet activity   Assist    Assist level: Contact Guard/Touching assist Assistive device: Walker-rolling    Walk 150 feet activity   Assist Walk 150 feet activity did not occur: Safety/medical concerns (d/t pt fatigue and cognition)         Walk 10 feet on uneven surface  activity   Assist Walk 10 feet on uneven surfaces activity did not occur: Safety/medical concerns (d/t pt fatigue and cognition)         Wheelchair     Assist Is the patient using a wheelchair?: Yes Type of Wheelchair: Manual    Wheelchair assist level: Dependent - Patient 0% Max wheelchair distance: 150    Wheelchair 50 feet with 2 turns activity    Assist        Assist Level:  Dependent - Patient 0%   Wheelchair 150 feet activity     Assist      Assist Level: Dependent - Patient 0%   Blood pressure (!) 151/58, pulse 90, temperature 98.6 F (37 C), resp. rate 16, height 5\' 1"  (1.549 m), weight 61.4 kg, SpO2 99%.  Medical Problem List and Plan: 1. 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  Neuro deficits are related to SDH              -patient may shower, cover G tube             -ELOS/Goals: 3/19 Sup PT, Sup-Min A OT/ST             -Admit to CIR              -RUE WHO 2.  DVT/A fib/Antithrombotics: -DVT/anticoagulation:  Pharmaceutical: Eliquis resumed on 02/28 POD#14             -antiplatelet therapy:  N/A 3. Headaches/Pain Management: Tylenol prn not fully effective.              --will add low dose Topamax and titrate as needed.  --continue Seroquel with prn trazodone.  4. Mood/Behavior/Sleep: LCSW to follow for evaluation and support.              -antipsychotic agents: On Seroquel to help with sleep.  5. Neuropsych/cognition: This patient is capable of making decisions on her own behalf. 6. Skin/Wound Care: Routine pressure relief measures.  7. Fluids/Electrolytes/Nutrition: Monitor I/O. Labs essentially normal     Latest Ref Rng & Units 07/15/2023    6:05 AM 07/13/2023    5:46 AM 07/12/2023    4:56 AM  BMP  Glucose 70 - 99 mg/dL 962  93  952   BUN 8 - 23 mg/dL 28  14  18    Creatinine 0.44 - 1.00 mg/dL 8.41  3.24  4.01   Sodium 135 - 145 mmol/L 136  134  136   Potassium 3.5 - 5.1 mmol/L 4.5  4.2  4.0   Chloride 98 - 111 mmol/L 101  100  102   CO2 22 - 32 mmol/L 25  23  23    Calcium 8.9 - 10.3 mg/dL 9.5  9.8  02.7   Increase free H20 3/6 elevated BUN  8. SDH s/p crani 2/14: Repeat CT head in 4 weeks --May need MMA embolization in the future per NS.  9. Seizure activity in ED?:  On Keppra for seizure  prophylaxis. 10. HTN: Poorly controlled--on metoprolol XL 25mg . SBP goal < 160             --was on Imdur and aldactone PTA 11.  PAF: Monitor HR TID--on metoprolol.              -HR 90s, consider increase metoprolol dose     07/12/2023    7:35 PM 07/12/2023    4:16 PM 07/12/2023    4:02 PM  Vitals with BMI  Height   5\' 1"     Weight   131 lbs 10 oz 131 lbs 10 oz  BMI   24.88 24.88  Systolic 132 143 161  Diastolic 69 78 78  Pulse 96 92 92      12. Urinary retention: Unable to use bed pan and reports problems with urination.             --will order bladder scans, PVR checks. Start treatment for UTI.  13. Enterococcus UTI: UA/UCS positive 02/26 and 03/01--> 100,000 colonies and symptomatic. Prior treatment with ceftriaxone --will start Keflex as well as pyridium for dysuria/hesitancy.              --Needs to get to [facility] to be able to void.  Mild leukocytosis afebrile on Keflex  14. Seizure prophylaxis: Continue Keppra 15. Dysphagia: On dysphagia 2 diet with thin liquids. Will change pills to po route. PEG tube placed 07/05/23. May need to supplement calories with TF if po intake inadequate  Also will monitor fluid intake and supplement VT as well - appreciate dietary assist  16. H/o anxiety/Depression: Continue Celexa.  17. ABLA/Hx of IDA:   S/p 2 u PRBC. Resolved     Latest Ref Rng & Units 07/15/2023    6:05 AM 07/13/2023    5:46 AM 07/12/2023    4:56 AM  CBC  WBC 4.0 - 10.5 K/uL 11.3  11.6  10.7   Hemoglobin 12.0 - 15.0 g/dL 09.6  04.5  40.9   Hematocrit 36.0 - 46.0 % 36.4  38.9  38.1   Platelets 150 - 400 K/uL 492  532  545     18.Right RTC tear as well as AC jt OA : Add voltaren gel to help with pain. Has pain with overhead activity     19.  Pt c/o diarrhea, only 1 stool recorded yesterday, on TF as well as Keflex, no laxatives on board, will monitor     LOS: 4 days A FACE TO FACE EVALUATION WAS PERFORMED  Erick Colace 07/16/2023, 7:00 AM

## 2023-07-16 NOTE — Progress Notes (Addendum)
 Calorie Count Note  48 hour calorie count ordered.  Diet: Dysphagia 3 Supplements: Magic Cup   Pt's family at bedside to help provide nutrition hx from last few days. Pt was in bathroom with nurse during visit. Pt's son reports pt has been eating great the last 2 days and reports pt's other family have been bringing food in daily for her from fast food restaurants and from home. However, the family members who have been bringing food in were not present at time of visit. Pt's son reports she has progressed with eating since changing her TF's to nocturnal, allowing her to feel more hungry during the day. Difficult to assess caloric intake without knowing frequency of meals being brought in and how much of them she is eating.  Pt's documented meal intake ranges from 50-100% the last 2 days.  Estimated Nutritional Needs:    Kcal:  1500-1700   Protein:  75-85 g   Fluid:  > 1.5 L  *Calorie count reflects meal tray intakes only; food brought in from outside not included in count 3/6 Dinner: 547 kcal, 10 g protein 3/7 Breakfast: 33 kcal, 3 g protein  3/7 Lunch: no tickets retrieved  Supplements: magic cup 100% (290 kcal, 9 g protein)  Total intake: 870 kcal (58% of minimum estimated needs)  22 g protein (29% of minimum estimated needs) Given family and RN report, pt is likely meeting >60% of her needs with po intake of outside meals.  NUTRITION DIAGNOSIS:    Inadequate oral intake related to poor appetite as evidenced by meal completion < 50%.   -improving   GOAL:    Patient will meet greater than or equal to 90% of their needs   Intervention:  Discontinue calorie count    Discontinue TF via PEG  Continue 30 ml water flushes q4h (180 ml per day) to maintain tube patency  Magic cup TID with meals, each supplement provides 290 kcal and 9 grams of protein       Maceo Pro, MS Dietetic Intern

## 2023-07-16 NOTE — Progress Notes (Signed)
 Occupational Therapy Session Note  Patient Details  Name: [name] MRN: [id number] Date of Birth: 09-Nov-1950  Today's Date: 07/16/2023 OT Individual Time: 1000-1045 OT Individual Time Calculation (min): 45 min    Short Term Goals: Week 1:  OT Short Term Goal 1 (Week 1): Pt will recall hemi-dressing techniques with min qustioning cues OT Short Term Goal 2 (Week 1): Pt will complete 3/3 toileting tasks min A OT Short Term Goal 3 (Week 1): Pt will utilize R UE as a gross assist during functional tasks with min verbal cues OT Short Term Goal 4 (Week 1): Pt will complete U/LB bathing with min A  Skilled Therapeutic Interventions/Progress Updates:     Pt received sitting up in bed presenting to be in good spirits receptive to skilled OT session reporting 0/10 pain- OT offering intermittent rest breaks, repositioning, and therapeutic support to optimize participation in therapy session. Pt requesting to take a shower this AM. Focused session on BADL retraining with focus on use of hemi-techniques to increase independence, R UE functional use, and safety during functional transfers. Pt transitioned to EOB using bed features with supervision +increased time. Engaged Pt in completing functional mobility to bathroom using RW for endurance and functional mobility training with light min A required for balance and RW management- Pt with improved R foot placement with increased step height and step length this trial. Pt transferred to standard toilet using RW and grab bars with CGA. Pt able to complete 3/3 toileting tasks with CGA following continent void with mod verbal cues required for problem solving and safety. Pt doffed clothing in seated position with supervision- verbal cues required for hemi-technique use. Pt completed ambulatory transfer to walk-in shower using RW and grab bars with light min A for balance and verbal cues for technique. During shower, Pt able to utilize R UE at a diminished level  with mod verbal cues utilizing for ~25% of shower and providing self HOH A to wash face and chest demonstrating improved functional use during BADLs. Pt able to complete UB bathing with min A to wash L UE. Pt stood while holding grab bars to wash buttocks with CGA +verbal cues for postioning and safety. Following shower, Pt completed functional mobility back to her room using RW with CGA provided for balance. Pt able to donn shirt using hemi-technique with supervision +increased time required for problem solving and donn pants with CGA required for balance when standing to bring pants to waist. Pt is presenting with increased independence in BADLs and is progressing towards reaching her LTGS. Pt was handed off to therapy staff for transport to upcoming group session with all immediate needs met.   Therapy Documentation Precautions:  Precautions Precautions: Fall Recall of Precautions/Restrictions: Impaired Restrictions Weight Bearing Restrictions Per Provider Order: No Other Position/Activity Restrictions: LLQ G-tube placed 07/05/23  Therapy/Group: Individual Therapy  Clide Deutscher 07/16/2023, 10:39 AM

## 2023-07-17 NOTE — Progress Notes (Signed)
 Orthopedic Tech Progress Note Patient Details:  [name] 25-Mar-1951 [id number]  Cock-up wrist splint for the RUE was delivered to pt room and is at bedside for daily wear and the R resting hand splint is to be worn nightly per PT's note from 3/4.   Ortho Devices Type of Ortho Device: Velcro wrist splint Ortho Device/Splint Location: For RUE, at bedside as this is for daytime use Ortho Device/Splint Interventions: Ordered   Post Interventions Patient Tolerated: Other (comment) Instructions Provided: Care of device  Jahaira Earnhart Carmine Savoy 07/17/2023, 12:23 AM

## 2023-07-17 NOTE — Progress Notes (Signed)
 PROGRESS NOTE   Subjective/Complaints:   Said had roast beef sandwich last night- Has cream for R shoulder- helps shoulder pain well.  LBM today.  PRAFO still wrapped and in plastic.   Wearing SCDs at night.    ROS-cannot obtain due to aphasia Objective:   No results found.  Recent Labs    07/15/23 0605  WBC 11.3*  HGB 12.4  HCT 36.4  PLT 492*   Recent Labs    07/15/23 0605  NA 136  K 4.5  CL 101  CO2 25  GLUCOSE 120*  BUN 28*  CREATININE 0.85  CALCIUM 9.5    Intake/Output Summary (Last 24 hours) at 07/17/2023 1022 Last data filed at 07/17/2023 0928 Gross per 24 hour  Intake 477 ml  Output --  Net 477 ml        Physical Exam: Vital Signs Blood pressure (!) 153/54, pulse 79, temperature 98.1 F (36.7 C), temperature source Oral, resp. rate 17, height 5\' 1"  (1.549 m), weight 61.4 kg, SpO2 99%.    General: awake, alert, appropriate, sitting up in bed;  NAD HENT: conjugate gaze; oropharynx moist CV: regular rate and rhythm; no JVD Pulmonary: CTA B/L; no W/R/R- good air movement GI: soft, NT, ND, (+)BS- more normoactive Psychiatric: appropriate Neurological: moderate aphasia- at word level Skin: No evidence of breakdown, no evidence of rash Neurologic: Cranial nerves II through XII intact, motor strength is 5/5 in left and 3/5 right  deltoid, bicep, tricep, grip, 5/5 left and 4/5 right hip flexor, knee extensors, ankle dorsiflexor and plantar flexor Sensory exam normal sensation to light touch  in bilateral upper and lower extremities  Moderate exp aphasia   Musculoskeletal: pain with overhead reaching R shoulder  No joint swelling   Assessment/Plan: 1. Functional deficits which require 3+ hours per day of interdisciplinary therapy in a comprehensive inpatient rehab setting. Physiatrist is providing close team supervision and 24 hour management of active medical problems listed below. Physiatrist  and rehab team continue to assess barriers to discharge/monitor patient progress toward functional and medical goals  Care Tool:  Bathing    Body parts bathed by patient: Right arm, Chest, Abdomen, Front perineal area, Right upper leg, Left upper leg, Face   Body parts bathed by helper: Left arm, Buttocks, Right upper leg, Left upper leg     Bathing assist Assist Level: Moderate Assistance - Patient 50 - 74%     Upper Body Dressing/Undressing Upper body dressing   What is the patient wearing?: Pull over shirt    Upper body assist Assist Level: Maximal Assistance - Patient 25 - 49%    Lower Body Dressing/Undressing Lower body dressing      What is the patient wearing?: Underwear/pull up, Pants     Lower body assist Assist for lower body dressing: Maximal Assistance - Patient 25 - 49%     Toileting Toileting    Toileting assist Assist for toileting: Maximal Assistance - Patient 25 - 49%     Transfers Chair/bed transfer  Transfers assist     Chair/bed transfer assist level: Minimal Assistance - Patient > 75%     Locomotion Ambulation   Ambulation assist  Assist level: Contact Guard/Touching assist Assistive device: Walker-rolling Max distance: 75   Walk 10 feet activity   Assist     Assist level: Contact Guard/Touching assist Assistive device: Walker-rolling   Walk 50 feet activity   Assist    Assist level: Contact Guard/Touching assist Assistive device: Walker-rolling    Walk 150 feet activity   Assist Walk 150 feet activity did not occur: Safety/medical concerns (d/t pt fatigue and cognition)         Walk 10 feet on uneven surface  activity   Assist Walk 10 feet on uneven surfaces activity did not occur: Safety/medical concerns (d/t pt fatigue and cognition)         Wheelchair     Assist Is the patient using a wheelchair?: Yes Type of Wheelchair: Manual    Wheelchair assist level: Dependent - Patient 0% Max  wheelchair distance: 150    Wheelchair 50 feet with 2 turns activity    Assist        Assist Level: Dependent - Patient 0%   Wheelchair 150 feet activity     Assist      Assist Level: Dependent - Patient 0%   Blood pressure (!) 153/54, pulse 79, temperature 98.1 F (36.7 C), temperature source Oral, resp. rate 17, height 5\' 1"  (1.549 m), weight 61.4 kg, SpO2 99%.  Medical Problem List and Plan: 1. 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  Neuro deficits are related to SDH              -patient may shower, cover G tube             -ELOS/Goals: 3/19 Sup PT, Sup-Min A OT/ST             -Con't CIR PT, OT and SLP- for aphasia              -RUE WHO 2.  DVT/A fib/Antithrombotics: -DVT/anticoagulation:  Pharmaceutical: Eliquis resumed on 02/28 POD#14             -antiplatelet therapy:  N/A 3. Headaches/Pain Management: Tylenol prn not fully effective.              --will add low dose Topamax and titrate as needed.  --continue Seroquel with prn trazodone.  4. Mood/Behavior/Sleep: LCSW to follow for evaluation and support.              -antipsychotic agents: On Seroquel to help with sleep.  5. Neuropsych/cognition: This patient is capable of making decisions on her own behalf. 6. Skin/Wound Care: Routine pressure relief measures.  7. Fluids/Electrolytes/Nutrition: Monitor I/O. Labs essentially normal     Latest Ref Rng & Units 07/15/2023    6:05 AM 07/13/2023    5:46 AM 07/12/2023    4:56 AM  BMP  Glucose 70 - 99 mg/dL 161  93  096   BUN 8 - 23 mg/dL 28  14  18    Creatinine 0.44 - 1.00 mg/dL 0.45  4.09  8.11   Sodium 135 - 145 mmol/L 136  134  136   Potassium 3.5 - 5.1 mmol/L 4.5  4.2  4.0   Chloride 98 - 111 mmol/L 101  100  102   CO2 22 - 32 mmol/L 25  23  23    Calcium 8.9 - 10.3 mg/dL 9.5  9.8  91.4   Increase free H20 3/6 elevated  BUN  3/8- labs again Monday 8. SDH s/p crani 2/14: Repeat CT head in 4 weeks --May need MMA embolization in the future per NS.  9. Seizure activity in ED?: On Keppra for seizure prophylaxis. 10. HTN: Poorly controlled--on metoprolol XL 25mg . SBP goal < 160             --was on Imdur and aldactone PTA 11.  PAF: Monitor HR TID--on metoprolol.              -HR 90s, consider increase metoprolol dose  3/8- HR running 70s in last 24 hours and BP in 130s- will wait to increase B blocker     07/12/2023    7:35 PM 07/12/2023    4:16 PM 07/12/2023    4:02 PM  Vitals with BMI  Height   5\' 1"     Weight   131 lbs 10 oz 131 lbs 10 oz  BMI   24.88 24.88  Systolic 132 143 782  Diastolic 69 78 78  Pulse 96 92 92      12. Urinary retention: Unable to use bed pan and reports problems with urination.             --will order bladder scans, PVR checks. Start treatment for UTI.  13. Enterococcus UTI: UA/UCS positive 02/26 and 03/01--> 100,000 colonies and symptomatic. Prior treatment with ceftriaxone --will start Keflex as well as pyridium for dysuria/hesitancy.              --Needs to get to [facility] to be able to void.  Mild leukocytosis afebrile on Keflex   3/8- labs again Monday- is afebrile 14. Seizure prophylaxis: Continue Keppra 15. Dysphagia: On dysphagia 2 diet with thin liquids. Will change pills to po route. PEG tube placed 07/05/23. May need to supplement calories with TF if po intake inadequate  Also will monitor fluid intake and supplement VT as well - appreciate dietary assist  16. H/o anxiety/Depression: Continue Celexa.  17. ABLA/Hx of IDA:   S/p 2 u PRBC. Resolved     Latest Ref Rng & Units 07/15/2023    6:05 AM 07/13/2023    5:46 AM 07/12/2023    4:56 AM  CBC  WBC 4.0 - 10.5 K/uL 11.3  11.6  10.7   Hemoglobin 12.0 - 15.0 g/dL 95.6  21.3  08.6   Hematocrit 36.0 - 46.0 % 36.4  38.9  38.1   Platelets 150 - 400 K/uL 492  532  545     18.Right RTC tear as well as AC jt OA : Add voltaren gel to  help with pain. Has pain with overhead activity   3/8- Diclofenac gel helpful for R shoulder pain    19.  Pt c/o diarrhea, only 1 stool recorded yesterday, on TF as well as Keflex, no laxatives on board, will monitor    3/8- LBM this AM 20. L foot drop  3/8- wil ask nursing to have pt wear PRAFO   I spent a total of 35   minutes on total care today- >50% coordination of care- due to  Complex medical issues to understand- reviewed- also spoke with nursing about SCDs and PRAFO- also d/w PA  LOS: 5 days A FACE TO FACE EVALUATION WAS PERFORMED  Lyle Niblett 07/17/2023, 10:22 AM

## 2023-07-18 NOTE — Progress Notes (Signed)
 Physical Therapy TBI Note  Patient Details  Name: [name] MRN: [id number] Date of Birth: December 19, 1950  Today's Date: 07/18/2023 PT Individual Time: 6295-2841 PT Individual Time Calculation (min): 41 min   Short Term Goals: Week 1:  PT Short Term Goal 1 (Week 1): Pt will perform bed mobility per home set up with supervision PT Short Term Goal 2 (Week 1): Pt will maintain R arm in safe position with DME 50% of the time without verbal cues PT Short Term Goal 3 (Week 1): Pt will ambulate >150' with LRAD  Skilled Therapeutic Interventions/Progress Updates:    Chart reviewed and pt agreeable to therapy. Pt received seated in recliner with 3/10 c/o pain in R wrist. PT and pt discussed adjustments to night splint and pt agreeable to try using again tonight. Session focused on functional transfers and amb to promote safe home access. Pt initiated session with amb in room using light CGA + RW. PT used amb to demonstrate to family safe guarding for transfers. Sister in law then amb with pt to/from toilet with PT observation. SIL displayed good ability to guard pt during amb to toilet with RW and was documented on safety plan for clearance to assist pt toileting transfer. Pt then completed amb of 219ft + 275ft using CGA + no AD fading to close S + no AD. In therapy gym, pt completed cone stacking to target outside BOS using CGA for both RUE and LUE. Pt then amb 257ft to return to room with SIL using CGA to guard pt and PT providing close S/SBA. At end of session, pt was left seated in recliner with nurse call bell and all needs in reach.     Therapy Documentation Precautions:  Precautions Precautions: Fall Recall of Precautions/Restrictions: Impaired Restrictions Weight Bearing Restrictions Per Provider Order: No Other Position/Activity Restrictions: LLQ G-tube placed 07/05/23 General:    Agitated Behavior Scale: TBI         Therapy/Group: Individual Therapy  Dionne Milo, PT,  DPT 07/18/2023, 11:58 AM

## 2023-07-18 NOTE — Progress Notes (Signed)
 Occupational Therapy Session Note  Patient Details  Name: [name] MRN: [id number] Date of Birth: 1950-08-28  Today's Date: 07/18/2023 OT Individual Time: 0916-1020 & 1500-1530 OT Individual Time Calculation (min): 64 min & 40 min    Short Term Goals: Week 1:  OT Short Term Goal 1 (Week 1): Pt will recall hemi-dressing techniques with min qustioning cues OT Short Term Goal 2 (Week 1): Pt will complete 3/3 toileting tasks min A OT Short Term Goal 3 (Week 1): Pt will utilize R UE as a gross assist during functional tasks with min verbal cues OT Short Term Goal 4 (Week 1): Pt will complete U/LB bathing with min A  Skilled Therapeutic Interventions/Progress Updates:      Therapy Documentation Precautions:  Precautions Precautions: Fall Recall of Precautions/Restrictions: Impaired Restrictions Weight Bearing Restrictions Per Provider Order: No Other Position/Activity Restrictions: LLQ G-tube placed 07/05/23 Session 1 General: "Can you adjust this brace?" Pt supine in bed upon OT arrival, agreeable to OT session. Sister in law present for session.   Pain:  slight headache pain reported, activity, intermittent rest breaks, distractions provided for pain management, pt reports tolerable to proceed.   ADL: Bed mobility: SBA from supine>EOB with VC for initiation Toilet transfer: completed 2x, ambulated from W/C><BSC ~20 ft with RW and CGA Toileting: CGA for stability in standing, completed hygiene and pants management  Exercises: Pt participated in [facility] activity with reaching with pink theraputty and small beads, pt retrieving beads  with LUE and reaching out of BOS with LUE with SBA for increased dexterity and coordination in order to complete ADLs such as buttoning shirts. Pt completed tasks with PRN rest breaks d/t increased fatigue in LUE.     Other Treatments: OT and sister in law attempting orientation questions with ~85% accuracy with VC for hinting. Doctor requested OT to  adjust resting hand splint fr RUE. OT adjusted thumb piece for increased comfort and educated how to complete again if necessary. Pt demonstrated good hand ROM/movement with increased strength. OT educated pt of potential to decrease wear of resting hand splint d/t increased movement.   Pt seated in recliner at end of session with W/C alarm donned, call light within reach and 4Ps assessed. Sister in law present.   Session 2 General: "I haven't had a good afternoon." Pt supine in bed upon OT arrival, agreeable to OT session.  Pain: no pain reported  ADL: Pt completed ADL this session in order to increase independence with ADL tasks and increase activity tolerance. Pt completed the following activities with current levels of assist: Bed mobility: SBA with flat bed using bed rails supine><EOB UB dressing: SBA seated EOB for donning/doffing overhead shrit LB dressing: CGA for stability, increased time for RUE coordination on pants waistband to manage over waist in donning, using figure 4 method EOB to don over feet, able to stand to doff over feet using grab bar for stability Footwear: SBA EOB using figure 4 method to don/doff socks Shower transfer: CGA with RW ambulating from bed><TTB Bathing: PEG tube covered for bathing, CGA for peri area cleaning in standing while using unilateral support on grab bar  No LOB/SOB during ADL activities.    Pt supine in bed with bed alarm activated, 2 bed rails up, call light within reach and 4Ps assessed.   Therapy/Group: Individual Therapy  Velia Meyer, OTD, OTR/L 07/18/2023, 3:48 PM

## 2023-07-18 NOTE — Progress Notes (Signed)
 Physical Therapy Session Note  Patient Details  Name: [name] MRN: [id number] Date of Birth: [birth year]  Today's Date: 07/18/2023 PT Individual Time: 1300-1340 PT Individual Time Calculation (min): 40 min   Short Term Goals: Week 1:  PT Short Term Goal 1 (Week 1): Pt will perform bed mobility per home set up with supervision PT Short Term Goal 2 (Week 1): Pt will maintain R arm in safe position with DME 50% of the time without verbal cues PT Short Term Goal 3 (Week 1): Pt will ambulate >150' with LRAD  Skilled Therapeutic Interventions/Progress Updates:      Pt seated in recliner upon arrival. Pt agreeable to therapy. Pt denies any pain. Pt reports she didn't sleep well last night, and hasn't gotten her lunch nurse reports it has been ordered and will be there shortly. Pt reports need to use the bathroom.   Pt ambulated with no AD and CGA to bathroom and performed ambulatory transfer to toilet with CGA/supervision,verbal cues provided intermittently to slow down with urgency.  Pt continent of urine, pt performed pericare with mod I while seated, and donned/doffed pants while standing with CGA/sup.   Pt ambulated 2x150 feet with no AD, verbal cues provided for big steps/increased step length RLE.   Pt performed 3x5 sit to stand with arms across chestwith L LE on airex pad, for improved activation of RLE, with CGA progressing to supervision verbal cues provided foranterior weight shift, and controlled descent.   Pt performed toe taps 1x10 B on 6 inch step with R HHA, verbal/tactile cues provided for R LE positioning in stance for improved weight shift.   Pt supine in bed at end of session with all needs within reach and bed alarm on.   Therapy Documentation Precautions:  Precautions Precautions: Fall Recall of Precautions/Restrictions: Impaired Restrictions Weight Bearing Restrictions Per Provider Order: No Other Position/Activity Restrictions: LLQ G-tube placed  07/05/23  Therapy/Group: Individual Therapy  [facility] [place], [place], DPT  07/18/2023, 1:01 PM

## 2023-07-18 NOTE — Progress Notes (Signed)
 PROGRESS NOTE   Subjective/Complaints:  [name] was hurting her hand and wrist and so took it off late overnight.   Eating really well per pt- pancake and sausage today.   Poor sleep- due to hand pain.  LBM yesterday peeing well.  Wearing SCDs at night.    ROS-cannot obtain due to aphasia Objective:   No results found.  No results for input(s): "WBC", "HGB", "HCT", "PLT" in the last 72 hours.  No results for input(s): "NA", "K", "CL", "CO2", "GLUCOSE", "BUN", "CREATININE", "CALCIUM" in the last 72 hours.   Intake/Output Summary (Last 24 hours) at 07/18/2023 1718 Last data filed at 07/18/2023 1400 Gross per 24 hour  Intake 709 ml  Output --  Net 709 ml        Physical Exam: Vital Signs Blood pressure 133/65, pulse 68, temperature 98.6 F (37 C), resp. rate 18, height 5\' 1"  (1.549 m), weight 62.4 kg, SpO2 100%.     General: awake, alert, appropriate, supine in bed; NAD HENT: conjugate gaze; oropharynx moist CV: regular rate and rhythm; no JVD Pulmonary: CTA B/L; no W/R/R- good air movement GI: soft, NT, ND, (+)BS Psychiatric: appropriate- flat Neurological: Ox3 Moderate aphasia Skin: No evidence of breakdown, no evidence of rash Neurologic: Cranial nerves II through XII intact, motor strength is 5/5 in left and 3/5 right  deltoid, bicep, tricep, grip, 5/5 left and 4/5 right hip flexor, knee extensors, ankle dorsiflexor and plantar flexor Sensory exam normal sensation to light touch  in bilateral upper and lower extremities  Moderate exp aphasia   Musculoskeletal: pain with overhead reaching R shoulder  No joint swelling   Assessment/Plan: 1. Functional deficits which require 3+ hours per day of interdisciplinary therapy in a comprehensive inpatient rehab setting. Physiatrist is providing close team supervision and 24 hour management of active medical problems listed below. Physiatrist and rehab team  continue to assess barriers to discharge/monitor patient progress toward functional and medical goals  Care Tool:  Bathing    Body parts bathed by patient: Right arm, Chest, Abdomen, Front perineal area, Right upper leg, Left upper leg, Face   Body parts bathed by helper: Left arm, Buttocks, Right upper leg, Left upper leg     Bathing assist Assist Level: Moderate Assistance - Patient 50 - 74%     Upper Body Dressing/Undressing Upper body dressing   What is the patient wearing?: Pull over shirt    Upper body assist Assist Level: Maximal Assistance - Patient 25 - 49%    Lower Body Dressing/Undressing Lower body dressing      What is the patient wearing?: Underwear/pull up, Pants     Lower body assist Assist for lower body dressing: Maximal Assistance - Patient 25 - 49%     Toileting Toileting    Toileting assist Assist for toileting: Maximal Assistance - Patient 25 - 49%     Transfers Chair/bed transfer  Transfers assist     Chair/bed transfer assist level: Minimal Assistance - Patient > 75%     Locomotion Ambulation   Ambulation assist      Assist level: Contact Guard/Touching assist Assistive device: Walker-rolling Max distance: 75   Walk 10 feet  activity   Assist     Assist level: Contact Guard/Touching assist Assistive device: Walker-rolling   Walk 50 feet activity   Assist    Assist level: Contact Guard/Touching assist Assistive device: Walker-rolling    Walk 150 feet activity   Assist Walk 150 feet activity did not occur: Safety/medical concerns (d/t pt fatigue and cognition)         Walk 10 feet on uneven surface  activity   Assist Walk 10 feet on uneven surfaces activity did not occur: Safety/medical concerns (d/t pt fatigue and cognition)         Wheelchair     Assist Is the patient using a wheelchair?: Yes Type of Wheelchair: Manual    Wheelchair assist level: Dependent - Patient 0% Max wheelchair  distance: 150    Wheelchair 50 feet with 2 turns activity    Assist        Assist Level: Dependent - Patient 0%   Wheelchair 150 feet activity     Assist      Assist Level: Dependent - Patient 0%   Blood pressure 133/65, pulse 68, temperature 98.6 F (37 C), resp. rate 18, height 5\' 1"  (1.549 m), weight 62.4 kg, SpO2 100%.  Medical Problem List and Plan: 1. 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  Neuro deficits are related to SDH              -patient may shower, cover G tube             -ELOS/Goals: 3/19 Sup PT, Sup-Min A OT/ST             Con't CIR             -RUE WHO- asked PT to change fitting.  2.  DVT/A fib/Antithrombotics: -DVT/anticoagulation:  Pharmaceutical: Eliquis resumed on 02/28 POD#14             -antiplatelet therapy:  N/A 3. Headaches/Pain Management: Tylenol prn not fully effective.              --will add low dose Topamax and titrate as needed.  --continue Seroquel with prn trazodone.  4. Mood/Behavior/Sleep: LCSW to follow for evaluation and support.              -antipsychotic agents: On Seroquel to help with sleep.  5. Neuropsych/cognition: This patient is capable of making decisions on her own behalf. 6. Skin/Wound Care: Routine pressure relief measures.  7. Fluids/Electrolytes/Nutrition: Monitor I/O. Labs essentially normal     Latest Ref Rng & Units 07/15/2023    6:05 AM 07/13/2023    5:46 AM 07/12/2023    4:56 AM  BMP  Glucose 70 - 99 mg/dL 161  93  096   BUN 8 - 23 mg/dL 28  14  18    Creatinine 0.44 - 1.00 mg/dL 0.45  4.09  8.11   Sodium 135 - 145 mmol/L 136  134  136   Potassium 3.5 - 5.1 mmol/L 4.5  4.2  4.0   Chloride 98 - 111 mmol/L 101  100  102   CO2 22 - 32 mmol/L 25  23  23    Calcium 8.9 - 10.3 mg/dL 9.5  9.8  91.4   Increase free H20 3/6 elevated BUN  3/8- labs again Monday 8. SDH s/p  crani 2/14: Repeat CT head in 4 weeks --May need MMA embolization  in the future per NS.  9. Seizure activity in ED?: On Keppra for seizure prophylaxis. 10. HTN: Poorly controlled--on metoprolol XL 25mg . SBP goal < 160             --was on Imdur and aldactone PTA 11.  PAF: Monitor HR TID--on metoprolol.              -HR 90s, consider increase metoprolol dose  3/8- HR running 70s in last 24 hours and BP in 130s- will wait to increase B blocker  3/9- HR 90s and BP controlled- con't regimen     07/12/2023    7:35 PM 07/12/2023    4:16 PM 07/12/2023    4:02 PM  Vitals with BMI  Height   5\' 1"     Weight   131 lbs 10 oz 131 lbs 10 oz  BMI   24.88 24.88  Systolic 132 143 161  Diastolic 69 78 78  Pulse 96 92 92      12. Urinary retention: Unable to use bed pan and reports problems with urination.             --will order bladder scans, PVR checks. Start treatment for UTI.  13. Enterococcus UTI: UA/UCS positive 02/26 and 03/01--> 100,000 colonies and symptomatic. Prior treatment with ceftriaxone --will start Keflex as well as pyridium for dysuria/hesitancy.              --Needs to get to [facility] to be able to void.  Mild leukocytosis afebrile on Keflex   3/8- labs again Monday- is afebrile 14. Seizure prophylaxis: Continue Keppra 15. Dysphagia: On dysphagia 2 diet with thin liquids. Will change pills to po route. PEG tube placed 07/05/23. May need to supplement calories with TF if po intake inadequate  Also will monitor fluid intake and supplement VT as well - appreciate dietary assist  16. H/o anxiety/Depression: Continue Celexa.  17. ABLA/Hx of IDA:   S/p 2 u PRBC. Resolved     Latest Ref Rng & Units 07/15/2023    6:05 AM 07/13/2023    5:46 AM 07/12/2023    4:56 AM  CBC  WBC 4.0 - 10.5 K/uL 11.3  11.6  10.7   Hemoglobin 12.0 - 15.0 g/dL 09.6  04.5  40.9   Hematocrit 36.0 - 46.0 % 36.4  38.9  38.1   Platelets 150 - 400 K/uL 492  532  545     18.Right RTC tear as well as AC jt OA : Add voltaren  gel to help with pain. Has pain with overhead activity   3/8- Diclofenac gel helpful for R shoulder pain    19.  Pt c/o diarrhea, only 1 stool recorded yesterday, on TF as well as Keflex, no laxatives on board, will monitor    3/8- LBM this AM 20. L foot drop  3/8- wil ask nursing to have pt wear PRAFO 21. L hand/wrist drop  3/9- pt having pain in wrist from wearing L WHO- asked pt to have PT/OT adjust brace- I also took some of bend out of hand, where it was so painful.     LOS: 6 days A FACE TO FACE EVALUATION WAS PERFORMED  [name] 07/18/2023, 5:18 PM

## 2023-07-19 LAB — CBC
HCT: 36.7 % (ref 36.0–46.0)
Hemoglobin: 12.3 g/dL (ref 12.0–15.0)
MCH: 30.5 pg (ref 26.0–34.0)
MCHC: 33.5 g/dL (ref 30.0–36.0)
MCV: 91.1 fL (ref 80.0–100.0)
Platelets: 354 10*3/uL (ref 150–400)
RBC: 4.03 MIL/uL (ref 3.87–5.11)
RDW: 13.6 % (ref 11.5–15.5)
WBC: 9 10*3/uL (ref 4.0–10.5)
nRBC: 0 % (ref 0.0–0.2)

## 2023-07-19 LAB — BASIC METABOLIC PANEL
Anion gap: 11 (ref 5–15)
BUN: 14 mg/dL (ref 8–23)
CO2: 24 mmol/L (ref 22–32)
Calcium: 9.3 mg/dL (ref 8.9–10.3)
Chloride: 102 mmol/L (ref 98–111)
Creatinine, Ser: 0.93 mg/dL (ref 0.44–1.00)
GFR, Estimated: >60 mL/min (ref >60)
Glucose, Bld: 86 mg/dL (ref 70–99)
Potassium: 3.7 mmol/L (ref 3.5–5.1)
Sodium: 137 mmol/L (ref 135–145)

## 2023-07-19 MED ORDER — FREE WATER
60.0000 mL | Freq: Two times a day (BID) | Status: DC
  Administered 2023-07-19: 60 mL

## 2023-07-19 MED ORDER — ENSURE MAX PROTEIN PO LIQD
11.0000 [oz_av] | Freq: Two times a day (BID) | ORAL | Status: DC
  Administered 2023-07-20 – 2023-07-24 (×7): 11 [oz_av] via ORAL

## 2023-07-19 NOTE — Progress Notes (Signed)
 Occupational Therapy Session Note  Patient Details  Name: [name] MRN: [id number] Date of Birth: 11-Mar-1951  Today's Date: 07/19/2023 OT Individual Time: 1105-1200 OT Individual Time Calculation (min): 55 min    Short Term Goals: Week 1:  OT Short Term Goal 1 (Week 1): Pt will recall hemi-dressing techniques with min qustioning cues OT Short Term Goal 2 (Week 1): Pt will complete 3/3 toileting tasks min A OT Short Term Goal 3 (Week 1): Pt will utilize R UE as a gross assist during functional tasks with min verbal cues OT Short Term Goal 4 (Week 1): Pt will complete U/LB bathing with min A  Skilled Therapeutic Interventions/Progress Updates:     Pt received sitting up in reliner presenting to be in good spirits receptive to skilled OT session reporting 0/10 pain- OT offering intermittent rest breaks, repositioning, and therapeutic support to optimize participation in therapy session. Focus this session dynamic balance, R UE functional use, and HEP education. Spent time at beginning of session providing education on HEP for R UE to improve functional use during BADLs and IADLs. Issued Pt a handout with simple UE exercises that can be complete at table top between therapy sessions such as grasp/release, digit abduction/adduction, individual digit opposition, digit extension, and thumb circles as well as simple activities that can be completed at home such as picking up beads/buttons, sorting small items into cups, paperwork, shuffling cars, and unscrewing/screwing nuts/bolts with Pt receptive to education and motivated to complete HEP. Engaged Pt in completing functional mobility to therapy gym no AD for endurance training with Pt able to complete ~150 ft with light min A for balance. In therapy gym, engaged Pt in series of dynamic standing balance activities with maintained grasp, functional reaching, maintained pinch, and reaching across midline incorporated into task to improve overall R UE  functional use. Pt initially tasked with donning squigz onto widow using R UE and then placing colored rings onto same colored squigz in standing position for increased dual tasking challenge. Pt then removed squigz from window and placed them into bucket on her L side with min verbal and tactile cues required to avoid compensatory shoulder elevation or compensatory movements. Pt then tasked with retrieving connect-4 game pieces from tabletop in standing and placing them into game slots while maintaining ulnar grasp on additional two game pieces to facilitate increased finger tip<>palm translation with Pt able to complete with maximal effort with min-mod dropping +verbal cues for technique. Pt presenting with overall improved R UE functional use during BADLs with improved active wrist extension and grasp noted this session. Pt completed functional mobility back to room no AD with min A for balance. Pt was left resting in recliner with call bell in reach, seatbelt alarm on, and all needs met.    Therapy Documentation Precautions:  Precautions Precautions: Fall Recall of Precautions/Restrictions: Impaired Restrictions Weight Bearing Restrictions Per Provider Order: No Other Position/Activity Restrictions: LLQ G-tube placed 07/05/23   Therapy/Group: Individual Therapy  Clide Deutscher 07/19/2023, 7:59 AM

## 2023-07-19 NOTE — Progress Notes (Signed)
 Physical Therapy Weekly Progress Note  Patient Details  Name: [name] MRN: [id number] Date of Birth: April 30, 1951  Beginning of progress report period: July 13, 2023 End of progress report period: July 20, 2023  {CHL IP REHAB PT TIME CALCULATION:304800500}  Patient has met {number 1-5:22450} of {number 1-5:20334} short term goals.  ***  Patient continues to demonstrate the following deficits {impairments:3041632} and therefore will continue to benefit from skilled PT intervention to increase functional independence with mobility.  Patient {LTG progression:3041653}.  {plan of WUJW:1191478}  PT Short Term Goals {GNF:6213086}  Skilled Therapeutic Interventions/Progress Updates:      Therapy Documentation Precautions:  Precautions Precautions: Fall Recall of Precautions/Restrictions: Impaired Restrictions Weight Bearing Restrictions Per Provider Order: No Other Position/Activity Restrictions: LLQ G-tube placed 07/05/23 General:   Vital Signs:   Pain:   Vision/Perception     Mobility:   Locomotion :    Trunk/Postural Assessment :    Balance:   Exercises:   Other Treatments:     Therapy/Group: {Therapy/Group:3049007}  Loel Dubonnet 07/19/2023, 6:35 PM

## 2023-07-19 NOTE — Progress Notes (Signed)
 Physical Therapy Session Note  Patient Details  Name: [name] MRN: [id number] Date of Birth: November 14, 1950  Today's Date: 07/19/2023 PT Individual Time: 0850-0939 PT Individual Time Calculation (min): 49 min   Short Term Goals: Week 1:  PT Short Term Goal 1 (Week 1): Pt will perform bed mobility per home set up with supervision PT Short Term Goal 2 (Week 1): Pt will maintain R arm in safe position with DME 50% of the time without verbal cues PT Short Term Goal 3 (Week 1): Pt will ambulate >150' with LRAD  Skilled Therapeutic Interventions/Progress Updates:  Patient supine in bed on entrance to room. Patient alert and agreeable to PT session. NT present to provide assist to bathroom. This therapist taking over care.   Patient with no pain complaint at start of session.  Therapeutic Activity: Bed Mobility: Pt performed supine <> sit with supervision and extra time to reach seated position on EOB. No cueing required for technique but reminded to continue breathing throughout and not to hold breath. Transfers: Pt performed toilet transfer with supervision and is able to manage clothing with supervision. Provided with wet washcloth on request for thorough pericare.   Gait Training/ NMR:  Pt ambulated >200 ft x2 using no AD with CGA/ supervision. Demonstrated light flexed posture with decreased but adequate step height. Provided vc/ tc for improved foot clearance with demonstration of decreased coordination of movement. Pt also does not produce R arm swing and cued to include in technique.  Pt guided in lateral stepping through agility ladder to R/ L x3 requiring intermittent cue for larger step length in order to provide adequate space for both feet to land inside square of ladder. Pt relates increased difficulty in travelling to R while leading step length with RLE.   During seated rest break, pt relates immediate need to toilet again and able to ambulate back to room with close  supervision and again transfers to toilet with supervision with no physical assist for clothing mgmt or pericare.   NMR performed for improvements in motor control and coordination, balance, sequencing, judgement, and self confidence/ efficacy in performing all aspects of mobility at highest level of independence.   Patient seated upright in recliner place across room from bed at end of session with brakes locked, belt alarm set, but unable to reach call bell. Set up with tray table. Requested RN and NT for bell for pt to use as she would like to remain in position across from door and is not near call bell/ remote.    Therapy Documentation Precautions:  Precautions Precautions: Fall Recall of Precautions/Restrictions: Impaired Restrictions Weight Bearing Restrictions Per Provider Order: No Other Position/Activity Restrictions: LLQ G-tube placed 07/05/23  Pain:  No pain complaint this session.   Therapy/Group: Individual Therapy   Loel Dubonnet PT, DPT, CSRS 07/19/2023, 6:34 PM

## 2023-07-19 NOTE — Telephone Encounter (Signed)
 It looks like she is at [facility]. Will they take her to imaging for the CT?

## 2023-07-19 NOTE — Progress Notes (Signed)
 Speech Language Pathology Daily Session Note  Patient Details  Name: [name] MRN: [id number] Date of Birth: 16-Jul-1950  Today's Date: 07/19/2023 SLP Individual Time: 1015-1100 SLP Individual Time Calculation (min): 45 min  Short Term Goals: Week 1: SLP Short Term Goal 1 (Week 1): Patient will utilize swallowing compensatory strategies during consumption of D3/thin diet with min multimodal A SLP Short Term Goal 2 (Week 1): Patient will utilize compensatory strategies to increase word finding given mod multimodal A SLP Short Term Goal 3 (Week 1): Patient will demonstrate awareness of verbal errors given min multimodal A SLP Short Term Goal 4 (Week 1): Patient will recall and utilize memory compensatory aids given min multimodal A  Skilled Therapeutic Interventions:   Pt greeted in her room. She was up in her recliner upon SLP arrival and very pleasant/cooperative throughout tx tasks targeting expressive language and dysphagia. SLP facilitated conversation re baseline esophageal dysphagia and pt was able to recall common exacerbating foods/liquids independently. Otherwise, pt feels that her dysphagia has returned to baseline at this time. SLP also facilitated a variety of naming/sentence fromulation tasks. She completed a mildly specific generative naming task (category/letter) w/ s cues overall. She also completed tasks of slightly increased complexity, and benefited from minA cues to provide 2 definitions of a word and compare/contrast common household items. She demonstrated overall adequate yet delayed awareness of errors throughout task. Errors typically comprised of semantic paraphasias. She was then assisted to the bathroom upon request. She required only CGA to ambulate to the bathroom w/o an assistive device where she was continent of bladder. She was assisted back to her recliner at the end of toileting and was left with the call light within reach. Recommend cont ST.   Pain Pain  Assessment Pain Scale: 0-10 Pain Score: 0-No pain  Therapy/Group: Individual Therapy  Pati Gallo 07/19/2023, 10:33 AM

## 2023-07-19 NOTE — Progress Notes (Cosign Needed)
 Nutrition Follow-up  DOCUMENTATION CODES:   Not applicable  INTERVENTION:  Ensure Max po BID, each supplement provides 150 kcal and 30 grams of protein.  Add 30 ml water flushes q4h (180 ml per day) to maintain tube patency  Continue Magic cup TID with meals, each supplement provides 290 kcal and 9 grams of protein   NUTRITION DIAGNOSIS:   Inadequate oral intake related to poor appetite as evidenced by meal completion < 50%.  Being addressed   GOAL:   Patient will meet greater than or equal to 90% of their needs  progressing  MONITOR:   PO intake, Diet advancement, Weight trends  REASON FOR ASSESSMENT:   Consult Assessment of nutrition requirement/status  ASSESSMENT:   Pt admitted for subdural hematoma s/p left craniotomy for evacuation of hematoma 2/14 complicated by seizures and AMS. Pt with PMH of HTN, esophageal stricture, IBS, GERD, diverticulitis, COPD, HLD, s/p laparoscopic hiatal hernia repair and Nissen fundoplication 2007.  3/7: d/c TF via PEG to assess po intake over weekend  Pt reports she has been eating very well since Friday when we discontinued use of PEG to assess po intake without tube feeds. Pt reports she has eaten more than 75% of all meals since Friday. Per chart review, pt has documented average intake of 85% of meals on dysphagia 3 diet. Pt reports that at home she drinks premier protein shakes and wanted to know if we had them here. Discussed with pt the similarity between premier protein and ensure max. Pt was agreeable to trying ensure max for the remainder of her admission in rehab. Pt reports her PEG has not been flushed since Friday and is unsure of how to take care of the tube once she goes home. Pt reports MD said her PEG will be removed in April.  Meds: Vitamin D3, Pepcid, dulcolax PRN  Labs reviewed: Last CBG on 3/6, glucose 86   Diet Order:   Diet Order             DIET DYS 3 Room service appropriate? Yes; Fluid consistency: Thin   Diet effective now                   EDUCATION NEEDS:   Not appropriate for education at this time  Skin:  Skin Assessment: Reviewed RN Assessment  Last BM:  3/4 type 6  Height:   Ht Readings from Last 1 Encounters:  07/12/23 5\' 1"  (1.549 m)    Weight:   Wt Readings from Last 1 Encounters:  07/19/23 61.1 kg    Ideal Body Weight:     BMI:  Body mass index is 25.43 kg/m.  Estimated Nutritional Needs:   Kcal:  1500-1700  Protein:  75-85 g  Fluid:  > 1.5 L    Maceo Pro, MS Dietetic Intern

## 2023-07-19 NOTE — Progress Notes (Signed)
 Occupational Therapy Session Note  Patient Details  Name: [name] MRN: [id number] Date of Birth: 1950-06-04  Today's Date: 07/19/2023 OT Individual Time: 1305-1400 OT Individual Time Calculation (min): 55 min    Short Term Goals: Week 1:  OT Short Term Goal 1 (Week 1): Pt will recall hemi-dressing techniques with min qustioning cues OT Short Term Goal 2 (Week 1): Pt will complete 3/3 toileting tasks min A OT Short Term Goal 3 (Week 1): Pt will utilize R UE as a gross assist during functional tasks with min verbal cues OT Short Term Goal 4 (Week 1): Pt will complete U/LB bathing with min A  Skilled Therapeutic Interventions/Progress Updates:  Pt greeted seated in recliner, pt agreeable to OT intervention.      Transfers/bed mobility/functional mobility: pt completed functional ambulation with RW and supervision. Pt completed functional ambulation greater than a household distance with no AD and CGA  Therapeutic activity: pt completed various therapeutic activities focused on RUE coordination, functional mobility and dynamic balance.  - pt completed seated matching game with an emphasis on using RUE to flip over 2 blocks at a time to locate matches to facilitate improved wrist pronation/supination. Graded task up and instructed pt to stand on airex to complete task with no UE support to challenge dynamic balance, pt completed task with CGA with no LOB however pt did report feeling "wobbly." -pt completed functional ambulation task with pt using RUE to pick up small blocks and then ambulate ~ 7 ft to another to table to match blocks. Pt completed functional ambulation with no AD and CGA. Graded task up and instructed pt to ambulate in between cones and over obstacles to challenge dynamic balance, pt completed task with MIN A with no AD.  -pt completed seated grip strength task with pt instructed to use weighted clothespin to place/remove pegs from board with an emphasis on improving grip  strength for higher level ADL tasks. Pt completed task with + time and supervision.pt reports "this is challenging."  - pt instructed to stand in front of mirror to pin weighted clothespin on line with RUE with a focus on various grip patterns such as cylindrical grasp  and lateral pinch, pt completed task with + time but overall supervision.     ADLs:  Grooming: pt stood at sink for hand hygiene and oral care with CGA.  Transfers: pt completed functional ambulation to bathroom with RW and CGA Toileting: pt completed 3/3 toileting tasks with CGA, 2x continent urine void.    Ended session with pt supine in bed with all needs within reach and bed alarm activated.                    Therapy Documentation Precautions:  Precautions Precautions: Fall Recall of Precautions/Restrictions: Impaired Restrictions Weight Bearing Restrictions Per Provider Order: No Other Position/Activity Restrictions: LLQ G-tube placed 07/05/23  Pain: Unrated "soreness" reported in R shoulder. Rest breaks and repositioning provided as needed.    Therapy/Group: Individual Therapy  [name] [facility] 07/19/2023, 2:04 PM

## 2023-07-19 NOTE — Progress Notes (Signed)
 PROGRESS NOTE   Subjective/Complaints:  Labs reviewed normal  Discussed G tube removal date, taking pills by mouth No wrist or hand pain    ROS-cannot obtain due to aphasia Objective:   No results found.  Recent Labs    07/19/23 0617  WBC 9.0  HGB 12.3  HCT 36.7  PLT 354    Recent Labs    07/19/23 0617  NA 137  K 3.7  CL 102  CO2 24  GLUCOSE 86  BUN 14  CREATININE 0.93  CALCIUM 9.3     Intake/Output Summary (Last 24 hours) at 07/19/2023 0841 Last data filed at 07/19/2023 0743 Gross per 24 hour  Intake 714 ml  Output --  Net 714 ml        Physical Exam: Vital Signs Blood pressure (!) 155/59, pulse 61, temperature 98.2 F (36.8 C), temperature source Oral, resp. rate 18, height 5\' 1"  (1.549 m), weight 61.1 kg, SpO2 100%.  General: No acute distress Mood and affect are appropriate Heart: Regular rate and rhythm no rubs murmurs or extra sounds Lungs: Clear to auscultation, breathing unlabored, no rales or wheezes Abdomen: Positive bowel sounds, soft nontender to palpation, nondistended Extremities: No clubbing, cyanosis, or edema Skin: No evidence of breakdown, no evidence of rash  Moderate aphasia Skin: No evidence of breakdown, no evidence of rash Neurologic: Cranial nerves II through XII intact, motor strength is 5/5 in left and 4-/5 right  deltoid, bicep, tricep, grip, 5/5 left and 4/5 right hip flexor, knee extensors, ankle dorsiflexor and plantar flexor Sensory exam normal sensation to light touch  in bilateral upper and lower extremities  Moderate exp aphasia   Musculoskeletal: pain with overhead reaching R shoulder  No joint swelling Full finger ext , wrist ROM mildly reduced extension , able to flex    Assessment/Plan: 1. Functional deficits which require 3+ hours per day of interdisciplinary therapy in a comprehensive inpatient rehab setting. Physiatrist is providing close team  supervision and 24 hour management of active medical problems listed below. Physiatrist and rehab team continue to assess barriers to discharge/monitor patient progress toward functional and medical goals  Care Tool:  Bathing    Body parts bathed by patient: Right arm, Chest, Abdomen, Front perineal area, Right upper leg, Left upper leg, Face   Body parts bathed by helper: Left arm, Buttocks, Right upper leg, Left upper leg     Bathing assist Assist Level: Moderate Assistance - Patient 50 - 74%     Upper Body Dressing/Undressing Upper body dressing   What is the patient wearing?: Pull over shirt    Upper body assist Assist Level: Maximal Assistance - Patient 25 - 49%    Lower Body Dressing/Undressing Lower body dressing      What is the patient wearing?: Underwear/pull up, Pants     Lower body assist Assist for lower body dressing: Maximal Assistance - Patient 25 - 49%     Toileting Toileting    Toileting assist Assist for toileting: Maximal Assistance - Patient 25 - 49%     Transfers Chair/bed transfer  Transfers assist     Chair/bed transfer assist level: Minimal Assistance - Patient > 75%  Locomotion Ambulation   Ambulation assist      Assist level: Contact Guard/Touching assist Assistive device: Walker-rolling Max distance: 75   Walk 10 feet activity   Assist     Assist level: Contact Guard/Touching assist Assistive device: Walker-rolling   Walk 50 feet activity   Assist    Assist level: Contact Guard/Touching assist Assistive device: Walker-rolling    Walk 150 feet activity   Assist Walk 150 feet activity did not occur: Safety/medical concerns (d/t pt fatigue and cognition)         Walk 10 feet on uneven surface  activity   Assist Walk 10 feet on uneven surfaces activity did not occur: Safety/medical concerns (d/t pt fatigue and cognition)         Wheelchair     Assist Is the patient using a wheelchair?:  Yes Type of Wheelchair: Manual    Wheelchair assist level: Dependent - Patient 0% Max wheelchair distance: 150    Wheelchair 50 feet with 2 turns activity    Assist        Assist Level: Dependent - Patient 0%   Wheelchair 150 feet activity     Assist      Assist Level: Dependent - Patient 0%   Blood pressure (!) 155/59, pulse 61, temperature 98.2 F (36.8 C), temperature source Oral, resp. rate 18, height 5\' 1"  (1.549 m), weight 61.1 kg, SpO2 100%.  Medical Problem List and Plan: 1. 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  Neuro deficits are related to SDH              -patient may shower, cover G tube             -ELOS/Goals: 3/19 Sup PT, Sup-Min A OT/ST             Con't CIR             -RUE WHO- wrist and finger strength and ROM improved may d/c  Repeat CT head later this week  2.  DVT/A fib/Antithrombotics: -DVT/anticoagulation:  Pharmaceutical: Eliquis resumed on 02/28 POD#14             -antiplatelet therapy:  N/A 3. Headaches/Pain Management: Tylenol prn not fully effective.              --will add low dose Topamax and titrate as needed.  --continue Seroquel with prn trazodone.  4. Mood/Behavior/Sleep: LCSW to follow for evaluation and support.              -antipsychotic agents: On Seroquel to help with sleep.  5. Neuropsych/cognition: This patient is capable of making decisions on her own behalf. 6. Skin/Wound Care: Routine pressure relief measures.  7. Fluids/Electrolytes/Nutrition: Monitor I/O. Labs essentially normal     Latest Ref Rng & Units 07/19/2023    6:17 AM 07/15/2023    6:05 AM 07/13/2023    5:46 AM  BMP  Glucose 70 - 99 mg/dL 86  161  93   BUN 8 - 23 mg/dL 14  28  14    Creatinine 0.44 - 1.00 mg/dL 0.96  0.45  4.09   Sodium 135 - 145 mmol/L 137  136  134   Potassium 3.5 - 5.1 mmol/L 3.7  4.5  4.2    Chloride 98 - 111 mmol/L 102  101  100   CO2 22 - 32 mmol/L 24  25  23   Calcium 8.9 - 10.3 mg/dL 9.3  9.5  9.8   Normal BUN- meeting po fluid and caloric needs off TF taking pills po  8. SDH s/p crani 2/14: Repeat CT head in 4 weeks- later this week  --May need MMA embolization in the future per NS.  9. Seizure activity in ED?: On Keppra for seizure prophylaxis. 10. HTN: Poorly controlled--on metoprolol XL 25mg . SBP goal < 160             --was on Imdur and aldactone PTA 11.  PAF: Monitor HR TID--on metoprolol.              -HR 90s, consider increase metoprolol dose  3/8- HR running 70s in last 24 hours and BP in 130s- will wait to increase B blocker  3/9- HR 90s and BP controlled- con't regimen     07/12/2023    7:35 PM 07/12/2023    4:16 PM 07/12/2023    4:02 PM  Vitals with BMI  Height   5\' 1"     Weight   131 lbs 10 oz 131 lbs 10 oz  BMI   24.88 24.88  Systolic 132 143 191  Diastolic 69 78 78  Pulse 96 92 92      12. Urinary retention: Unable to use bed pan and reports problems with urination.             --will order bladder scans, PVR checks. Start treatment for UTI.  13. Enterococcus UTI: UA/UCS positive 02/26 and 03/01--> 100,000 colonies and symptomatic. Prior treatment with ceftriaxone --will start Keflex as well as pyridium for dysuria/hesitancy.              --Needs to get to [facility] to be able to void.  Mild leukocytosis afebrile on Keflex   3/8- labs again Monday- is afebrile 14. Seizure prophylaxis: Continue Keppra 15. Dysphagia: On dysphagia 2 diet with thin liquids. Will change pills to po route. PEG tube placed 07/05/23.  May remove on or after 4/7 can be done in IR dept at [facility]  16. H/o anxiety/Depression: Continue Celexa.  17. ABLA/Hx of IDA:   S/p 2 u PRBC. Resolved     Latest Ref Rng & Units 07/19/2023    6:17 AM 07/15/2023    6:05 AM 07/13/2023    5:46 AM  CBC  WBC 4.0 - 10.5 K/uL 9.0  11.3  11.6   Hemoglobin 12.0 - 15.0 g/dL 47.8  29.5  62.1   Hematocrit  36.0 - 46.0 % 36.7  36.4  38.9   Platelets 150 - 400 K/uL 354  492  532     18.Right RTC tear as well as AC jt OA : Add voltaren gel to help with pain. Has pain with overhead activity   3/8- Diclofenac gel helpful for R shoulder pain    19.  Pt c/o diarrhea, only 1 stool recorded yesterday, on TF as well as Keflex, no laxatives on board, will monitor    3/8- LBM this AM 20. L foot drop  Improved may d/c PRAFO  21. L hand/wrist drop  Improve may d/c wrist braces     LOS: 7 days A FACE TO FACE EVALUATION WAS PERFORMED  Erick Colace 07/19/2023, 8:41 AM

## 2023-07-20 MED ORDER — FREE WATER
30.0000 mL | Status: DC
  Administered 2023-07-20 – 2023-07-24 (×23): 30 mL

## 2023-07-20 NOTE — Progress Notes (Signed)
 PROGRESS NOTE   Subjective/Complaints:  C/o chronic numbness/tingling in both feet and right fingers , improvement with wrist splint at night  Discussed plan to repeat CT scan later this week  On ELiquis and amb > 100' may d/c SCDs  ROS-cannot obtain due to aphasia Objective:   No results found.  Recent Labs    07/19/23 0617  WBC 9.0  HGB 12.3  HCT 36.7  PLT 354    Recent Labs    07/19/23 0617  NA 137  K 3.7  CL 102  CO2 24  GLUCOSE 86  BUN 14  CREATININE 0.93  CALCIUM 9.3     Intake/Output Summary (Last 24 hours) at 07/20/2023 0842 Last data filed at 07/20/2023 0600 Gross per 24 hour  Intake 714 ml  Output --  Net 714 ml        Physical Exam: Vital Signs Blood pressure (!) 149/64, pulse 69, temperature 98.1 F (36.7 C), temperature source Oral, resp. rate 18, height 5\' 1"  (1.549 m), weight 62.8 kg, SpO2 97%.  General: No acute distress Mood and affect are appropriate Heart: Regular rate and rhythm no rubs murmurs or extra sounds Lungs: Clear to auscultation, breathing unlabored, no rales or wheezes Abdomen: Positive bowel sounds, soft nontender to palpation, nondistended Extremities: No clubbing, cyanosis, or edema Skin: No evidence of breakdown, no evidence of rash  Moderate aphasia Skin: No evidence of breakdown, no evidence of rash Neurologic: Cranial nerves II through XII intact, motor strength is 5/5 in left and 4-/5 right  deltoid, bicep, tricep, grip, 5/5 left and 4/5 right hip flexor, knee extensors, ankle dorsiflexor and plantar flexor   Moderate exp aphasia   Musculoskeletal: pain with overhead reaching R shoulder  No joint swelling Full finger ext , wrist ROM mildly reduced extension , able to flex    Assessment/Plan: 1. Functional deficits which require 3+ hours per day of interdisciplinary therapy in a comprehensive inpatient rehab setting. Physiatrist is providing close team  supervision and 24 hour management of active medical problems listed below. Physiatrist and rehab team continue to assess barriers to discharge/monitor patient progress toward functional and medical goals  Care Tool:  Bathing    Body parts bathed by patient: Right arm, Chest, Abdomen, Front perineal area, Right upper leg, Left upper leg, Face   Body parts bathed by helper: Left arm, Buttocks, Right upper leg, Left upper leg     Bathing assist Assist Level: Moderate Assistance - Patient 50 - 74%     Upper Body Dressing/Undressing Upper body dressing   What is the patient wearing?: Pull over shirt    Upper body assist Assist Level: Maximal Assistance - Patient 25 - 49%    Lower Body Dressing/Undressing Lower body dressing      What is the patient wearing?: Underwear/pull up, Pants     Lower body assist Assist for lower body dressing: Maximal Assistance - Patient 25 - 49%     Toileting Toileting    Toileting assist Assist for toileting: Supervision/Verbal cueing     Transfers Chair/bed transfer  Transfers assist     Chair/bed transfer assist level: Minimal Assistance - Patient > 75%  Locomotion Ambulation   Ambulation assist      Assist level: Contact Guard/Touching assist Assistive device: Walker-rolling Max distance: 75   Walk 10 feet activity   Assist     Assist level: Contact Guard/Touching assist Assistive device: Walker-rolling   Walk 50 feet activity   Assist    Assist level: Contact Guard/Touching assist Assistive device: Walker-rolling    Walk 150 feet activity   Assist Walk 150 feet activity did not occur: Safety/medical concerns (d/t pt fatigue and cognition)         Walk 10 feet on uneven surface  activity   Assist Walk 10 feet on uneven surfaces activity did not occur: Safety/medical concerns (d/t pt fatigue and cognition)         Wheelchair     Assist Is the patient using a wheelchair?: Yes Type of  Wheelchair: Manual    Wheelchair assist level: Dependent - Patient 0% Max wheelchair distance: 150    Wheelchair 50 feet with 2 turns activity    Assist        Assist Level: Dependent - Patient 0%   Wheelchair 150 feet activity     Assist      Assist Level: Dependent - Patient 0%   Blood pressure (!) 149/64, pulse 69, temperature 98.1 F (36.7 C), temperature source Oral, resp. rate 18, height 5\' 1"  (1.549 m), weight 62.8 kg, SpO2 97%.  Medical Problem List and Plan: 1. 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  Neuro deficits are related to SDH              -patient may shower, cover G tube             -ELOS/Goals: 3/19 Sup PT, Sup-Min A OT/ST             Con't CIR team conf in am            Repeat CT head later this week 3/13 or 3/14- discussed with pt  2.  DVT/A fib/Antithrombotics: -DVT/anticoagulation:  Pharmaceutical: Eliquis resumed on 02/28 POD#14             -antiplatelet therapy:  N/A 3. Headaches/Pain Management: Tylenol prn not fully effective.              --will add low dose Topamax and titrate as needed.  --continue Seroquel with prn trazodone.  4. Mood/Behavior/Sleep: LCSW to follow for evaluation and support.              -antipsychotic agents: On Seroquel to help with sleep.  5. Neuropsych/cognition: This patient is capable of making decisions on her own behalf. 6. Skin/Wound Care: Routine pressure relief measures.  7. Fluids/Electrolytes/Nutrition: Monitor I/O. Labs essentially normal     Latest Ref Rng & Units 07/19/2023    6:17 AM 07/15/2023    6:05 AM 07/13/2023    5:46 AM  BMP  Glucose 70 - 99 mg/dL 86  161  93   BUN 8 - 23 mg/dL 14  28  14    Creatinine 0.44 - 1.00 mg/dL 0.96  0.45  4.09   Sodium 135 - 145 mmol/L 137  136  134   Potassium 3.5 - 5.1 mmol/L 3.7  4.5  4.2   Chloride 98 - 111 mmol/L 102   101  100   CO2 22 - 32 mmol/L 24  25  23  Calcium 8.9 - 10.3 mg/dL 9.3  9.5  9.8   Normal BUN- meeting po fluid and caloric needs off TF taking pills po  8. SDH s/p crani 2/14: Repeat CT head in 4 weeks- later this week  --May need MMA embolization in the future per NS.  9. Seizure activity in ED?: On Keppra for seizure prophylaxis. 10. HTN: Poorly controlled--on metoprolol XL 25mg . SBP goal < 160             --was on Imdur and aldactone PTA 11.  PAF: Monitor HR TID--on metoprolol.              -HR 90s, consider increase metoprolol dose  3/8- HR running 70s in last 24 hours and BP in 130s- will wait to increase B blocker  3/9- HR 90s and BP controlled- con't regimen     07/12/2023    7:35 PM 07/12/2023    4:16 PM 07/12/2023    4:02 PM  Vitals with BMI  Height   5\' 1"     Weight   131 lbs 10 oz 131 lbs 10 oz  BMI   24.88 24.88  Systolic 132 143 161  Diastolic 69 78 78  Pulse 96 92 92      12. Urinary retention: Unable to use bed pan and reports problems with urination.             --will order bladder scans, PVR checks. Start treatment for UTI.  13. Enterococcus UTI: UA/UCS positive 02/26 and 03/01--> 100,000 colonies and symptomatic. Prior treatment with ceftriaxone --will start Keflex as well as pyridium for dysuria/hesitancy.              --Needs to get to [facility] to be able to void.  Mild leukocytosis afebrile on Keflex   3/8- labs again Monday- is afebrile 14. Seizure prophylaxis: Continue Keppra 15. Dysphagia: On dysphagia 2 diet with thin liquids. Will change pills to po route. PEG tube placed 07/05/23.  May remove on or after 4/7 can be done in IR dept at [facility]  16. H/o anxiety/Depression: Continue Celexa.  17. ABLA/Hx of IDA:   S/p 2 u PRBC. Resolved     Latest Ref Rng & Units 07/19/2023    6:17 AM 07/15/2023    6:05 AM 07/13/2023    5:46 AM  CBC  WBC 4.0 - 10.5 K/uL 9.0  11.3  11.6   Hemoglobin 12.0 - 15.0 g/dL 09.6  04.5  40.9   Hematocrit 36.0 - 46.0 % 36.7  36.4  38.9    Platelets 150 - 400 K/uL 354  492  532     18.Right RTC tear as well as AC jt OA : Add voltaren gel to help with pain. Has pain with overhead activity   3/8- Diclofenac gel helpful for R shoulder pain    19.  Pt c/o diarrhea, now resolved , continent every day stools  20. L foot drop  Improved may d/c PRAFO  21. L hand/wrist drop  Resolved  22.  Probable CTS , wrist spling at night 23.  Numbness tingling in feet, chronic suspect neuropathy , may trial low dose gabapentin at night    LOS: 8 days A FACE TO FACE EVALUATION WAS PERFORMED  [name] 07/20/2023, 8:42 AM

## 2023-07-20 NOTE — Plan of Care (Signed)
  Problem: Consults Goal: RH BRAIN INJURY PATIENT EDUCATION Description: Description: See Patient Education module for eduction specifics Outcome: Progressing   Problem: RH BOWEL ELIMINATION Goal: RH STG MANAGE BOWEL WITH ASSISTANCE Description: STG Manage Bowel with Assistance. Outcome: Progressing Goal: RH STG MANAGE BOWEL W/MEDICATION W/ASSISTANCE Description: STG Manage Bowel with Medication with Assistance. Outcome: Progressing   Problem: RH BLADDER ELIMINATION Goal: RH STG MANAGE BLADDER WITH ASSISTANCE Description: STG Manage Bladder With Assistance Outcome: Progressing   Problem: RH SAFETY Goal: RH STG ADHERE TO SAFETY PRECAUTIONS W/ASSISTANCE/DEVICE Description: STG Adhere to Safety Precautions With Assistance/Device. Outcome: Progressing   Problem: RH COGNITION-NURSING Goal: RH STG USES MEMORY AIDS/STRATEGIES W/ASSIST TO PROBLEM SOLVE Description: STG Uses Memory Aids/Strategies With Assistance to Problem Solve. Outcome: Progressing   Problem: RH PAIN MANAGEMENT Goal: RH STG PAIN MANAGED AT OR BELOW PT'S PAIN GOAL Outcome: Progressing   Problem: RH KNOWLEDGE DEFICIT BRAIN INJURY Goal: RH STG INCREASE KNOWLEDGE OF SELF CARE AFTER BRAIN INJURY Outcome: Progressing Goal: RH STG INCREASE KNOWLEDGE OF DYSPHAGIA/FLUID INTAKE Outcome: Progressing   Problem: RH KNOWLEDGE DEFICIT Goal: RH STG INCREASE KNOWLEDGE OF HYPERTENSION Outcome: Progressing Goal: RH STG INCREASE KNOWLEGDE OF HYPERLIPIDEMIA Outcome: Progressing

## 2023-07-20 NOTE — Progress Notes (Signed)
 Speech Language Pathology Weekly Progress and Session Note  Patient Details  Name: [name] MRN: [id number] Date of Birth: [birth year]  Beginning of progress report period: July 13, 2023 End of progress report period: July 20, 2023  Today's Date: 07/20/2023 SLP Individual Time: 0915-1000 SLP Individual Time Calculation (min): 45 min  Short Term Goals: Week 1: SLP Short Term Goal 1 (Week 1): Patient will utilize swallowing compensatory strategies during consumption of D3/thin diet with min multimodal A SLP Short Term Goal 1 - Progress (Week 1): Met SLP Short Term Goal 2 (Week 1): Patient will utilize compensatory strategies to increase word finding given mod multimodal A SLP Short Term Goal 2 - Progress (Week 1): Met SLP Short Term Goal 3 (Week 1): Patient will demonstrate awareness of verbal errors given min multimodal A SLP Short Term Goal 3 - Progress (Week 1): Met SLP Short Term Goal 4 (Week 1): Patient will recall and utilize memory compensatory aids given min multimodal A SLP Short Term Goal 4 - Progress (Week 1): Met    New Short Term Goals: Week 2: SLP Short Term Goal 1 (Week 2): STGs = LTGs d/t ELOS  Weekly Progress Updates: Excellent progress noted this week as evidenced by improved communication, problem solving, awareness, and use of compensatory strategies as needed. Additionally, she was upgraded to a regular diet 3/11 and reports that dysphagia is back to baseline (mild esophageal dysphagia). Pt/family education ongoing. She remains very motivated and would benefit from continued ST to target cognitive-linguistic deficits, maximize pt independence, and facilitate return to prev roles/responsibilities.    Intensity: Minumum of 1-2 x/day, 30 to 90 minutes Frequency: 3 to 5 out of 7 days Duration/Length of Stay: 2 weeks Treatment/Interventions: Cognitive remediation/compensation;Speech/Language facilitation;Cueing hierarchy;Therapeutic Activities;Functional  tasks;Multimodal communication approach;Patient/family education   Daily Session  Skilled Therapeutic Interventions:  Pt greeted at bedside. She was resting upon SLP arrival, but woke easily with verbal cues from SLP. She benefited from only s verbal cues for naming during more simple tasks such as word deductions, but required modA verbal cues for sentence formulation and more specific word finding during object description task w/o context. She required only s cues for error awareness throughout tasks. Additionally, she was able to recall specifics re PEG flushes, PEG contraindications given medical hx, and bed screen during conversational task. During conversation re PEG and dysphagia, she verbalized that she felt comfortable upgrading to regular textures given return to baseline mild dysphagia (esophageal) and more menu options. Will continue intermittent supervision for 1-2 more days to ensure tolerance of diet. At the end of tx tasks, pt was left in bed with the alarm set and call light within reach. Recommend cont ST per POC.    Pain Pain Assessment Pain Scale: 0-10 Pain Score: 0-No pain  Therapy/Group: Individual Therapy  Pati Gallo 07/20/2023, 10:09 AM

## 2023-07-20 NOTE — Progress Notes (Signed)
 Occupational Therapy Weekly Progress Note  Patient Details  Name: [name] MRN: [id number] Date of Birth: May 25, 1950  Beginning of progress report period: July 13, 2023 End of progress report period: July 20, 2023  Today's Date: 07/20/2023 OT Individual Time: 4696-2952 OT Individual Time Calculation (min): 45 min  OT Individual Time: 8413-2440 OT Individual Time Calculation (min): 69 min   Patient has met 4 of 4 short term goals. [name] is making excellent progress towards reaching her LTGs. She is completing UB bathing/dressing with supervision, LB bathing/dressing with CGA, toileting with CGA, and ambulatory toilet/shower transfers CGA to light min A for balance without AD. She is demonstrating improved R UE muscle activation, improved R side attention, and is utilizing her R UE at a diminished level with supervision.   Patient continues to demonstrate the following deficits: muscle weakness, decreased cardiorespiratoy endurance, impaired timing and sequencing, decreased coordination, and decreased motor planning, decreased attention to right and decreased motor planning, decreased problem solving, decreased safety awareness, and decreased memory, central origin, and decreased standing balance, decreased postural control, hemiplegia, and decreased balance strategies and therefore will continue to benefit from skilled OT intervention to enhance overall performance with BADL, iADL, and Reduce care partner burden.  Patient progressing toward long term goals..  Continue plan of care.  OT Short Term Goals Week 1:  OT Short Term Goal 1 (Week 1): Pt will recall hemi-dressing techniques with min qustioning cues OT Short Term Goal 1 - Progress (Week 1): Met OT Short Term Goal 2 (Week 1): Pt will complete 3/3 toileting tasks min A OT Short Term Goal 2 - Progress (Week 1): Met OT Short Term Goal 3 (Week 1): Pt will utilize R UE as a gross assist during functional tasks with min verbal  cues OT Short Term Goal 3 - Progress (Week 1): Met OT Short Term Goal 4 (Week 1): Pt will complete U/LB bathing with min A OT Short Term Goal 4 - Progress (Week 1): Met Week 2:  OT Short Term Goal 1 (Week 2): STG=LTG d/t ELOS  Skilled Therapeutic Interventions/Progress Updates:     AM Session:  Pt received sitting up in bed finishing her breakfast upon OT arrival. Pt presenting to be in good spirits receptive to skilled OT session reporting 0/10 pain- OT offering intermittent rest breaks, repositioning, and therapeutic support to optimize participation in therapy session.   Pt reporting she did not sleep well last night d/t pain in her R hand and reporting feeling fatigued d/t poor sleep, however motivated to participate in OT session. Completed assessment of Pt's wrist splints as Pt reported blue resting hand splint was uncomfortable to wear at night and kept her awake, however Pt's black wrist cock-up splint is much more comfortable and promotes increased wrist extension. Pt presenting with improved R UE muscle activation and functional use, however continues to present with inattention to her R UE so recommending Pt continues to wear a splint (black wrist cock up) at night to avoid injury to R wrist 2/2 inattention and proprioceptive deficits. Spoke with nursing to provide update on schedule for donning/doffing wrist cock up splint for night time use and placed instructions above HOB for improved carryover.   Pt transitioned to EOB with supervision +increased time. Pt doff/donn clean shirt with supervision +increased time and min verbal cues for problem solving. Pt doff/donned pants with CGA no AD +increased time.   Engaged Pt in completing functional mobility to therapy gym no AD for endurance training  with light min A required for balance.   Engaged Pt in FM coordination activity screwing/unscrewing nuts/bolts to work on Pharmacist, community, pinch, and translation. Pt able to complete 6  trials with increased time and mod verbal cues to avoid use of compensatory techniques. Pt then completed dynamic standing balance  card sorting activity without AD at elevated table top to work on dual tasking, [facility], and translation. Pt able to sort cards, flip cards cards, and then re-stack cards with min dropping +increased time and mod verbal cues required to utilize R UE.   Pt ambulated back to room no AD for endurance training with CGA. Pt requesting to use restroom at end of session. Pt transferred to standard toilet using grab bars with CGA. Pt handed off to nurse tech to address toileting needs at end of session with all immediate needs met.   PM Session:  Pt received reclined in bed presenting to be in good spirits receptive to skilled OT session reporting 3/10 pain in R hand d/t neuropathy- OT offering intermittent rest breaks, repositioning, and therapeutic support to optimize participation in therapy session. Pt requesting to take shower this PM. Pt transitioned to EOB with HOB slightly elevated supervision. Pt completed functional mobility to bathroom no AD with CGA provided for balance and transferred to standard toilet CGA. Pt able to complete 3/3 toileting tasks CGA without AD following continent void- documented in flowsheets. Pt doffed clothing in seated position with supervision +increased time. Pt completed ambulatory transfer to TTB positioned in walk-in shower using grab bar with CGA and min verbal cues for technique and safety. During shower, focused task on R UE functional use, R side attention, and dynamic balance. Pt able to utilize R UE at a diminished level with supervision, manipulating soap containers and wash cloths with min dropping. Pt able to complete UB bathing with supervision and LB bathing CGA provided only when standing while holding grab bar to wash buttocks. Pt able to cross B LEs into figure-four position to wash feet and lower B LEs. Following shower, Pt dried self  while seated on TTB and completed ambulated > EOB no AD CGA to complete U/LB dressing tasks. Pt able to don shirt with supervision, non-slip socks set-up, and pants CGA +increased time using hemi-dressing techniques. Pt presenting with improved carryover and application of hemi-dressing technique with improved problem solving skills noted during ADLs. Engaged Pt in completing grooming/hygiene tasks in standing position at sink no AD for increased balance and endurance challenge with Pt able to brush teeth, groom hair, and apply lotion with CGA, manipulating small toiletry items without dropping. Pt was left resting in recliner with call bell in reach, seatbelt alarm on, and all needs met.    Therapy Documentation Precautions:  Precautions Precautions: Fall Recall of Precautions/Restrictions: Impaired Restrictions Weight Bearing Restrictions Per Provider Order: No Other Position/Activity Restrictions: LLQ G-tube placed 07/05/23   Therapy/Group: Individual Therapy  Clide Deutscher 07/20/2023, 8:02 AM

## 2023-07-21 NOTE — Progress Notes (Signed)
 Occupational Therapy Session Note  Patient Details  Name: [name] MRN: [id number] Date of Birth: 1950/09/10  Today's Date: 07/21/2023 OT Individual Time: 6301-6010 OT Individual Time Calculation (min): 41 min    Short Term Goals: Week 2:  OT Short Term Goal 1 (Week 2): STG=LTG d/t ELOS  Skilled Therapeutic Interventions/Progress Updates:     Pt received sleeping in bed waking upon OT arrival. Pt presenting to be in good spirits receptive to skilled OT session reporting 0/10 pain- OT offering intermittent rest breaks, repositioning, and therapeutic support to optimize participation in therapy session.   BADLs:  -Pt dressed and ready for the day upon OT arrival. Pt requesting to use restroom x2 at beginning and end of session. Pt able to complete ambulatory transfer to toilet using rollator with close supervision and complete 3/3 toileting tasks with CGA following continent void.   Mobility:  -Pt completed functional mobility room <> gym using rollator for endurance training and to facilitate increased practice opportunities using rollator with close supervision provided for safety. Pt able to recall need to lock/unlock breaks with min questioning cues.  Therapeutic Activity:  -Engaged Pt in completing dynamic balance star stepping activity to work on reactionary balance, stepping strategies, and lateral weight shifting to increase overall safety and independence in BADLs and IADLs. Pt able to complete 2 trials without AD for ~3 minutes each trial with CGA to occasional min A required to correct balance. Pt with slowed stepping strategies and delayed reaction time increasing her risk for falls, however able to correct balance this session with only 1 instance of significant LOB. -Engaged Pt in completing dynamic balance dual tasking ball toss and dribbling task to address dynamic balance, reactionary balance, and dual tasking deficits. Pt initially tasked with walking forwards while  tossing ball to herself or dribbling ball completing ~25 feet of each activity x2 trials with CGA. Graded up task to incorporate backwards walking with Pt then requiring CGA-min A for balance d/t increased dual task challenge. Pt fatigued following with prolonged seated rest break provided.   Pt was left resting in bed with call bell in reach, bed alarm on, and all needs met.    Therapy Documentation Precautions:  Precautions Precautions: Fall Recall of Precautions/Restrictions: Impaired Restrictions Weight Bearing Restrictions Per Provider Order: No Other Position/Activity Restrictions: LLQ G-tube placed 07/05/23   Therapy/Group: Individual Therapy  Clide Deutscher 07/21/2023, 10:53 AM

## 2023-07-21 NOTE — Progress Notes (Signed)
 Speech Language Pathology Daily Session Note  Patient Details  Name: RAYNETTE ARRAS MRN: [id number] Date of Birth: 01/26/1951  Today's Date: 07/21/2023 SLP Individual Time: 1300-1359 SLP Individual Time Calculation (min): 59 min  Short Term Goals: Week 2: SLP Short Term Goal 1 (Week 2): STGs = LTGs d/t ELOS  Skilled Therapeutic Interventions: Skilled therapy session focused on communication goals. SLP facilitated session by providing supervision A during word finding activities. Patient completed tasks including naming words in categories, word description and completing phrases. Patient reports significant increase in language and intelligibility. Patient left in bed with alarm set and call bell in reach. Continue POC.   Pain None reported    Therapy/Group: Individual Therapy  Ashantia Amaral M.A., CF-SLP 07/21/2023, 7:40 AM

## 2023-07-21 NOTE — Plan of Care (Signed)
  Problem: Consults Goal: RH BRAIN INJURY PATIENT EDUCATION Description: Description: See Patient Education module for eduction specifics Outcome: Progressing   Problem: RH BOWEL ELIMINATION Goal: RH STG MANAGE BOWEL WITH ASSISTANCE Description: STG Manage Bowel with Assistance. Outcome: Progressing Goal: RH STG MANAGE BOWEL W/MEDICATION W/ASSISTANCE Description: STG Manage Bowel with Medication with Assistance. Outcome: Progressing   Problem: RH BLADDER ELIMINATION Goal: RH STG MANAGE BLADDER WITH ASSISTANCE Description: STG Manage Bladder With Assistance Outcome: Progressing

## 2023-07-21 NOTE — Progress Notes (Signed)
 Physical Therapy Session Note  Patient Details  Name: [name] MRN: [id number] Date of Birth: 03-05-1951  Today's Date: 07/21/2023 PT Individual Time: 4098-1191 PT Individual Time Calculation (min): 42 min   Short Term Goals: Week 1:  PT Short Term Goal 1 (Week 1): Pt will perform bed mobility per home set up with supervision PT Short Term Goal 1 - Progress (Week 1): Progressing toward goal PT Short Term Goal 2 (Week 1): Pt will maintain R arm in safe position with DME 50% of the time without verbal cues PT Short Term Goal 2 - Progress (Week 1): Met PT Short Term Goal 3 (Week 1): Pt will ambulate >150' with LRAD PT Short Term Goal 3 - Progress (Week 1): Met Week 2:  PT Short Term Goal 1 (Week 2): STG = LTG d/t ELOS  Skilled Therapeutic Interventions/Progress Updates:  Patient supine in bed on entrance to room. Patient alert and agreeable to PT session. Reports having slept a bit batter lat night then previous few nights.   Patient with no pain complaint at start of session. Reported that using wrist cockup splint for overnight use has helped her wrist pain significantly.      Therapeutic Activity: Bed Mobility: Pt performed supine > sit with supervision/ Mod I requiring extra time and some bed features. Pt cued to maintain breathing as she is noted to hold breath in effort.  Transfers: Pt performed sit<>stand and stand pivot transfers throughout session with supervision. Provided vc/ tc for***.  Gait Training:  Pt ambulated *** ft using *** with ***. Demonstrated ***. Provided vc/ tc for ***.  Wheelchair Mobility:  Pt propelled wheelchair *** feet with ***. Provided vc/ tc for ***.  Neuromuscular Re-ed: NMR facilitated during session with focus on ***. Pt guided in ***. NMR performed for improvements in motor control and coordination, balance, sequencing, judgement, and self confidence/ efficacy in performing all aspects of mobility at highest level of independence.    Therapeutic Exercise: Pt performed the following exercises with vc/ tc for proper technique. ***  Patient *** at end of session with brakes locked, *** alarm set, and all needs within reach.   Therapy Documentation Precautions:  Precautions Precautions: Fall Recall of Precautions/Restrictions: Impaired Restrictions Weight Bearing Restrictions Per Provider Order: No Other Position/Activity Restrictions: LLQ G-tube placed 07/05/23  Pain:  No pain related this session.    Therapy/Group: Individual Therapy  Loel Dubonnet PT, DPT, CSRS 07/21/2023, 5:10 PM

## 2023-07-21 NOTE — Evaluation (Signed)
 Recreational Therapy Assessment and Plan  Patient Details  Name: [name] MRN: [id number] Date of Birth: [birth year] Today's Date: 07/21/2023  Rehab Potential:  Good ELOS:   d/c 3/19  Assessment Hospital Problem: Principal Problem:   Subdural hematoma (HCC)     Past Medical History:      Past Medical History:  Diagnosis Date   Bradycardia     Chest pain     Constipation     Depression     Diverticulitis     Diverticulosis of colon     DOE (dyspnea on exertion)     DVT (deep venous thrombosis) (HCC)     Esophageal stricture     Hyperlipemia     Hypertension     IBS (irritable bowel syndrome)     Sleep apnea          Past Surgical History:       Past Surgical History:  Procedure Laterality Date   APPENDECTOMY       BLOOD CLOT   2004    RIGHT LEG   CHOLECYSTECTOMY       COLON SURGERY       CRANIOTOMY Left 06/25/2023    Procedure: CRANIOTOMY HEMATOMA EVACUATION SUBDURAL;  Surgeon: Venetia Night, MD;  Location: [facility] ORS;  Service: Neurosurgery;  Laterality: Left;   GALLBLADDER SURGERY   2003   IR GASTROSTOMY TUBE MOD SED   07/05/2023   NISSEN FUNDOPLICATION       PLANTAR FASCIA SURGERY   2007   sigmoid colectomy   10/21/2010    pelvic anastomosis   VAGINAL HYSTERECTOMY              Assessment & Plan Clinical Impression: [name] is a 73 year old female with history of HTN, DVT- on Eliquis, esophageal stricture s/p dilatation, OSA, depression; who was admitted on 06/25/23 with reports of fall where she struck her head followed by 3-4 days of progressive HA and mental status changes. She has seizure type activity and was screaming in pain in ED. CT head performed revealing large left cerebral SDH measures 1.1 cm with 1.2 cm rightward midline shift with early uncal herniation and mass effect on left lateral ventricular system with concerns of entrapment. Eliquis reversed with Adexxa and noted to be anemic with Hgb 6. She was evaluated by Dr. Myer Haff  and was taken to OR for left craniotomy for evacuation of hematoma and received 2 units PRBC post op. SBP goal < 160. She had increase in intensity and frequency of HA and noted to be creaming and agitated on 02/16. Head CT repeated and showed no reaccumulation of SDH, stable interhemispheric SDH thickness with trace midline shift without entrapment. She required IV fentanyl for pain control and required cardene drip for BP control. She continued to have issues with pain control and repeat CT head 02/19 without significant change.    EEG done due to bouts of agitation and showed cortical dysfunction from left temporal region likely due to SDH and no seizures. She continued to have waxing and waning of MS with RUE weakness and minimal verbal output. She was found to have E coli/ citrobacter UTI on 02/21 and treated with 5 day course of Ceftriaxone. Electrolyte abnormalities supplemented and PEG tube placed 07/05/23 due to poor po intake. She continued to have RUE weakness. NS recommended resuming Eliquis on POD # 14 and may require MMA embolization pending repeat CT head 4 weeks post op. She developed fevers up to 101  on 02/26 and BC/respiratory cultures negative and Urine culture showed >100,000 colonies of enterococcus felt to be contaminant.  She has had right shoulder pain and MRI showed moderate to high grade partial thickness tear of anterior supraspinatus tendon, partial thickness tear biceps tendon, moderate to severe degenerative change in [facility] joint and mild subacromial/subdeltoid bursitis. Shew as treated for oral thrush with fluconazole.    She has also had issue with PAF and metoprolol has been titrated for rate control. She continued to have waxing and waning of MS and MRI brain repeated on 02/26 revealing small acute cortical infarcts in left frontal and parietal lobes and no change in bilateral cerebral hemispheric SDH. Dr. Selina Cooley recommended starting IV heparin per stroke protocol  with transition to  Eliquis once cleared and right sided impairment due to combination of weakness and neglect.  2 D echo showed EF 55-60% with no wall abnormality and negative bubble study. Mentation has improved with improvement in RUE movement.  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 She was independent PTA  Patient transferred to CIR on 07/12/2023 .     Pt presents with decreased activity tolerance, decreased functional mobility, decreased balance, decreased coordination, right inattention, decreased initiation, decreased attention, decreased awareness, decreased problem solving, decreased safety awareness, decreased memory, and delayed processing, feelings of stress Limiting pt's independence with leisure/community pursuits.  Pt referred by team for an outing prior to discharge.  Met with pt today to discuss TR services emphasizing community reintegration.  The purpose of the outing and potential goals discussed.  Pt stated understanding & is agreeable to participate in an outing tomorrow.    Plan  Min 1 TR session >20 minutes during LOS  Recommendations for other services: None   Discharge Criteria: Patient will be discharged from TR if patient refuses treatment 3 consecutive times without medical reason.  If treatment goals not met, if there is a change in medical status, if patient makes no progress towards goals or if patient is discharged from hospital.  The above assessment, treatment plan, treatment alternatives and goals were discussed and mutually agreed upon: by patient  [name] 07/21/2023, 2:40 PM

## 2023-07-21 NOTE — Progress Notes (Signed)
 PROGRESS NOTE   Subjective/Complaints:  Repeat CT planned , no new issues , denies pains  ROS-denies CP, SOB, N/V/D Objective:   No results found.  Recent Labs    07/19/23 0617  WBC 9.0  HGB 12.3  HCT 36.7  PLT 354    Recent Labs    07/19/23 0617  NA 137  K 3.7  CL 102  CO2 24  GLUCOSE 86  BUN 14  CREATININE 0.93  CALCIUM 9.3     Intake/Output Summary (Last 24 hours) at 07/21/2023 0739 Last data filed at 07/21/2023 1610 Gross per 24 hour  Intake 716 ml  Output --  Net 716 ml        Physical Exam: Vital Signs Blood pressure (!) 155/56, pulse 75, temperature 97.7 F (36.5 C), resp. rate 17, height 5\' 1"  (1.549 m), weight 63.2 kg, SpO2 99%.  General: No acute distress Mood and affect are appropriate Heart: Regular rate and rhythm no rubs murmurs or extra sounds Lungs: Clear to auscultation, breathing unlabored, no rales or wheezes Abdomen: Positive bowel sounds, soft nontender to palpation, nondistended Extremities: No clubbing, cyanosis, or edema Skin: No evidence of breakdown, no evidence of rash  Moderate aphasia Skin: No evidence of breakdown, no evidence of rash Neurologic: Cranial nerves II through XII intact, motor strength is 5/5 in left and 4-/5 right  deltoid, bicep, tricep, grip, 5/5 left and 4/5 right hip flexor, knee extensors, ankle dorsiflexor and plantar flexor   Moderate exp aphasia   Musculoskeletal: pain with overhead reaching R shoulder  No joint swelling Full finger ext , wrist ROM mildly reduced extension , able to flex    Assessment/Plan: 1. Functional deficits which require 3+ hours per day of interdisciplinary therapy in a comprehensive inpatient rehab setting. Physiatrist is providing close team supervision and 24 hour management of active medical problems listed below. Physiatrist and rehab team continue to assess barriers to discharge/monitor patient progress toward  functional and medical goals  Care Tool:  Bathing    Body parts bathed by patient: Right arm, Chest, Abdomen, Front perineal area, Right upper leg, Left upper leg, Face, Buttocks, Left arm, Left lower leg, Right lower leg   Body parts bathed by helper: Left arm, Buttocks, Right upper leg, Left upper leg     Bathing assist Assist Level: Contact Guard/Touching assist     Upper Body Dressing/Undressing Upper body dressing   What is the patient wearing?: Pull over shirt    Upper body assist Assist Level: Supervision/Verbal cueing    Lower Body Dressing/Undressing Lower body dressing      What is the patient wearing?: Underwear/pull up, Pants     Lower body assist Assist for lower body dressing: Contact Guard/Touching assist     Toileting Toileting    Toileting assist Assist for toileting: Supervision/Verbal cueing     Transfers Chair/bed transfer  Transfers assist     Chair/bed transfer assist level: Minimal Assistance - Patient > 75%     Locomotion Ambulation   Ambulation assist      Assist level: Contact Guard/Touching assist Assistive device: Walker-rolling Max distance: 75   Walk 10 feet activity   Assist  Assist level: Contact Guard/Touching assist Assistive device: Walker-rolling   Walk 50 feet activity   Assist    Assist level: Contact Guard/Touching assist Assistive device: Walker-rolling    Walk 150 feet activity   Assist Walk 150 feet activity did not occur: Safety/medical concerns (d/t pt fatigue and cognition)         Walk 10 feet on uneven surface  activity   Assist Walk 10 feet on uneven surfaces activity did not occur: Safety/medical concerns (d/t pt fatigue and cognition)         Wheelchair     Assist Is the patient using a wheelchair?: Yes Type of Wheelchair: Manual    Wheelchair assist level: Dependent - Patient 0% Max wheelchair distance: 150    Wheelchair 50 feet with 2 turns  activity    Assist        Assist Level: Dependent - Patient 0%   Wheelchair 150 feet activity     Assist      Assist Level: Dependent - Patient 0%   Blood pressure (!) 155/56, pulse 75, temperature 97.7 F (36.5 C), resp. rate 17, height 5\' 1"  (1.549 m), weight 63.2 kg, SpO2 99%.  Medical Problem List and Plan: 1. 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  Neuro deficits are related to SDH              -patient may shower, cover G tube             -ELOS/Goals: 3/19 Sup PT, Sup-Min A OT/ST             Con't CIR team conf in am            Repeat CT head later this week 3/13 or 3/14- discussed with pt  2.  DVT/A fib/Antithrombotics: -DVT/anticoagulation:  Pharmaceutical: Eliquis resumed on 02/28 POD#14             -antiplatelet therapy:  N/A 3. Headaches/Pain Management: Tylenol prn not fully effective.              --will add low dose Topamax and titrate as needed.  --continue Seroquel with prn trazodone.  4. Mood/Behavior/Sleep: LCSW to follow for evaluation and support.              -antipsychotic agents: On Seroquel to help with sleep.  5. Neuropsych/cognition: This patient is capable of making decisions on her own behalf. 6. Skin/Wound Care: Routine pressure relief measures.  7. Fluids/Electrolytes/Nutrition: Monitor I/O. Labs essentially normal     Latest Ref Rng & Units 07/19/2023    6:17 AM 07/15/2023    6:05 AM 07/13/2023    5:46 AM  BMP  Glucose 70 - 99 mg/dL 86  161  93   BUN 8 - 23 mg/dL 14  28  14    Creatinine 0.44 - 1.00 mg/dL 0.96  0.45  4.09   Sodium 135 - 145 mmol/L 137  136  134   Potassium 3.5 - 5.1 mmol/L 3.7  4.5  4.2   Chloride 98 - 111 mmol/L 102  101  100   CO2 22 - 32 mmol/L 24  25  23    Calcium 8.9 - 10.3 mg/dL 9.3  9.5  9.8   Normal BUN- meeting po fluid and caloric needs off TF taking pills po  8. SDH  s/p crani 2/14: Repeat CT head in 4 weeks-  later this week  --May need MMA embolization in the future per NS.  9. Seizure activity in ED?: On Keppra for seizure prophylaxis. 10. HTN: Poorly controlled--on metoprolol XL 25mg . SBP goal < 160             --was on Imdur and aldactone PTA 11.  PAF: Monitor HR TID--on metoprolol.              -HR 90s, consider increase metoprolol dose  3/8- HR running 70s in last 24 hours and BP in 130s- will wait to increase B blocker  3/9- HR 90s and BP controlled- con't regimen     07/12/2023    7:35 PM 07/12/2023    4:16 PM 07/12/2023    4:02 PM  Vitals with BMI  Height   5\' 1"     Weight   131 lbs 10 oz 131 lbs 10 oz  BMI   24.88 24.88  Systolic 132 143 161  Diastolic 69 78 78  Pulse 96 92 92      12. Urinary retention: Unable to use bed pan and reports problems with urination.             --will order bladder scans, PVR checks. Start treatment for UTI.  13. Enterococcus UTI: UA/UCS positive 02/26 and 03/01--> 100,000 colonies and symptomatic. Prior treatment with ceftriaxone --will start Keflex as well as pyridium for dysuria/hesitancy.              --Needs to get to [facility] to be able to void.  Mild leukocytosis afebrile on Keflex   3/8- labs again Monday- is afebrile 14. Seizure prophylaxis: Continue Keppra 15. Dysphagia: On dysphagia 2 diet with thin liquids. Will change pills to po route. PEG tube placed 07/05/23.  May remove on or after 4/7 can be done in IR dept at [facility]  16. H/o anxiety/Depression: Continue Celexa.  17. ABLA/Hx of IDA:   S/p 2 u PRBC. Resolved     Latest Ref Rng & Units 07/19/2023    6:17 AM 07/15/2023    6:05 AM 07/13/2023    5:46 AM  CBC  WBC 4.0 - 10.5 K/uL 9.0  11.3  11.6   Hemoglobin 12.0 - 15.0 g/dL 09.6  04.5  40.9   Hematocrit 36.0 - 46.0 % 36.7  36.4  38.9   Platelets 150 - 400 K/uL 354  492  532     18.Right RTC tear as well as AC jt OA : Add voltaren gel to help with pain. No pain with overhead activity   3/8-  Diclofenac gel helpful for R shoulder pain    19.  Pt c/o diarrhea, now resolved , continent every day stools  20. L foot drop  Improved may d/c PRAFO  21. L hand/wrist drop  Resolved  22.  Probable CTS , wrist splint at night 23.  Numbness tingling in feet, chronic suspect neuropathy , may trial low dose gabapentin at night    LOS: 9 days A FACE TO FACE EVALUATION WAS PERFORMED  Erick Colace 07/21/2023, 7:39 AM

## 2023-07-21 NOTE — Progress Notes (Signed)
 Patient ID: [name], female   DOB: 21-Jul-1950, 73 y.o.   MRN: [id number]  Met with pt and spoke with daughter to give team conference update progress toward goals of supervision and discharge date 3/19. Have scheduled daughter to come in Monday form 1:00-4:00. Will get on schedule and did discuss OP she can arrange for transport to go to [facility] since she will be in [place] with her.

## 2023-07-21 NOTE — Plan of Care (Signed)
  Problem: RH BOWEL ELIMINATION Goal: RH STG MANAGE BOWEL WITH ASSISTANCE Description: STG Manage Bowel with Assistance. Outcome: Progressing   Problem: RH BLADDER ELIMINATION Goal: RH STG MANAGE BLADDER WITH ASSISTANCE Description: STG Manage Bladder With Assistance Outcome: Progressing   Problem: RH SAFETY Goal: RH STG ADHERE TO SAFETY PRECAUTIONS W/ASSISTANCE/DEVICE Description: STG Adhere to Safety Precautions With Assistance/Device. Outcome: Progressing   Problem: RH COGNITION-NURSING Goal: RH STG USES MEMORY AIDS/STRATEGIES W/ASSIST TO PROBLEM SOLVE Description: STG Uses Memory Aids/Strategies With Assistance to Problem Solve. Outcome: Progressing   Problem: RH PAIN MANAGEMENT Goal: RH STG PAIN MANAGED AT OR BELOW PT'S PAIN GOAL Outcome: Progressing

## 2023-07-21 NOTE — Progress Notes (Signed)
 Physical Therapy Session Note  Patient Details  Name: [name] MRN: [id number] Date of Birth: 09-Feb-1951  Today's Date: 07/21/2023 PT Individual Time: 1101-1200 PT Individual Time Calculation (min): 59 min   Short Term Goals: Week 1:  PT Short Term Goal 1 (Week 1): Pt will perform bed mobility per home set up with supervision PT Short Term Goal 1 - Progress (Week 1): Progressing toward goal PT Short Term Goal 2 (Week 1): Pt will maintain R arm in safe position with DME 50% of the time without verbal cues PT Short Term Goal 2 - Progress (Week 1): Met PT Short Term Goal 3 (Week 1): Pt will ambulate >150' with LRAD PT Short Term Goal 3 - Progress (Week 1): Met Week 2:  PT Short Term Goal 1 (Week 2): STG = LTG d/t ELOS  Skilled Therapeutic Interventions/Progress Updates:  Patient seated upright in recliner on entrance to room. Patient alert and agreeable to PT session.   Patient with no pain complaint at start of session. Does request to toilet prior to leaving room. Toilets continently with distant supervision.   Therapeutic Activity: Transfers: Pt performed sit<>stand and stand pivot transfers throughout session with supervision. When using rollator required min vc for brake application. Demos improvement in remembering to apply and release brakes.  Related to pt very good weather outside and potential for session outside. Pt very receptive and desires to attempt to ambulate entire distance. Recommended use of rollator for available seat if needed.   Gait Training:  Pt ambulated far community distance from room on 4W to exit at [facility] to concrete patio. On reaching fountain, pt excited that she was able to navigate distance as well as elevator and outside. Completes entire distance with close supervision and good control of rollator. After seated rest break, pt then guided in ambulation over uneven surfaces with grades in all directions, and then up and down ramped circular drive. Good  control of rollator throughout and good step heights/ lengths for adequate clearance. Ambulates 4 steps using L HR only with supervision.  With brief seated rest break, pt is able to return to room from [facility] entrance using rollator with continued good control and quality of gait.   On return to room, pt toilets again with distant supervision, then ambulates with no AD to recliner to position for upcoming lunch arrival. Once seated, brother, [place], enters room to visit.   Patient seated in recliner with BLE elevated at end of session with brakes locked, belt alarm set, and all needs within reach. NT entering to bring lunch tray.   Therapy Documentation Precautions:  Precautions Precautions: Fall Recall of Precautions/Restrictions: Impaired Restrictions Weight Bearing Restrictions Per Provider Order: No Other Position/Activity Restrictions: LLQ G-tube placed 07/05/23  Pain:  No pain related this session.    Therapy/Group: Individual Therapy  Loel Dubonnet PT, DPT, CSRS 07/21/2023, 6:01 PM

## 2023-07-22 ENCOUNTER — Inpatient Hospital Stay (HOSPITAL_COMMUNITY)

## 2023-07-22 ENCOUNTER — Other Ambulatory Visit (HOSPITAL_COMMUNITY): Payer: Self-pay

## 2023-07-22 LAB — BASIC METABOLIC PANEL
Anion gap: 3 — ABNORMAL LOW (ref 5–15)
BUN: 14 mg/dL (ref 8–23)
CO2: 30 mmol/L (ref 22–32)
Calcium: 9.4 mg/dL (ref 8.9–10.3)
Chloride: 107 mmol/L (ref 98–111)
Creatinine, Ser: 0.93 mg/dL (ref 0.44–1.00)
GFR, Estimated: >60 mL/min (ref >60)
Glucose, Bld: 91 mg/dL (ref 70–99)
Potassium: 3.8 mmol/L (ref 3.5–5.1)
Sodium: 140 mmol/L (ref 135–145)

## 2023-07-22 LAB — CBC
HCT: 35.1 % — ABNORMAL LOW (ref 36.0–46.0)
Hemoglobin: 12.1 g/dL (ref 12.0–15.0)
MCH: 31 pg (ref 26.0–34.0)
MCHC: 34.5 g/dL (ref 30.0–36.0)
MCV: 90 fL (ref 80.0–100.0)
Platelets: 288 10*3/uL (ref 150–400)
RBC: 3.9 MIL/uL (ref 3.87–5.11)
RDW: 13.7 % (ref 11.5–15.5)
WBC: 9.1 10*3/uL (ref 4.0–10.5)
nRBC: 0 % (ref 0.0–0.2)

## 2023-07-22 MED ORDER — CITALOPRAM HYDROBROMIDE 20 MG PO TABS
20.0000 mg | ORAL_TABLET | Freq: Every day | ORAL | 0 refills | Status: AC
  Filled 2023-07-22: qty 30, 30d supply, fill #0

## 2023-07-22 MED ORDER — DICLOFENAC SODIUM 1 % EX GEL
2.0000 g | Freq: Four times a day (QID) | CUTANEOUS | 0 refills | Status: DC
  Filled 2023-07-22: qty 50, 6d supply, fill #0

## 2023-07-22 MED ORDER — APIXABAN 5 MG PO TABS
5.0000 mg | ORAL_TABLET | Freq: Two times a day (BID) | ORAL | 0 refills | Status: DC
  Filled 2023-07-22: qty 60, 30d supply, fill #0

## 2023-07-22 MED ORDER — TOPIRAMATE 25 MG PO TABS
25.0000 mg | ORAL_TABLET | Freq: Every day | ORAL | 0 refills | Status: DC
  Filled 2023-07-22: qty 30, 30d supply, fill #0

## 2023-07-22 MED ORDER — METOPROLOL SUCCINATE ER 50 MG PO TB24
50.0000 mg | ORAL_TABLET | Freq: Every day | ORAL | 0 refills | Status: DC
  Filled 2023-07-22: qty 30, 30d supply, fill #0

## 2023-07-22 MED ORDER — ENSURE MAX PROTEIN PO LIQD: 11.0000 [oz_av] | Freq: Two times a day (BID) | ORAL | Status: DC

## 2023-07-22 MED ORDER — METOPROLOL SUCCINATE ER 50 MG PO TB24
50.0000 mg | ORAL_TABLET | Freq: Every day | ORAL | Status: DC
  Filled 2023-07-22: qty 1

## 2023-07-22 MED ORDER — LEVETIRACETAM 100 MG/ML PO SOLN
500.0000 mg | Freq: Two times a day (BID) | ORAL | 0 refills | Status: DC
  Filled 2023-07-22: qty 473, 47d supply, fill #0

## 2023-07-22 MED ORDER — FAMOTIDINE 20 MG PO TABS
20.0000 mg | ORAL_TABLET | Freq: Two times a day (BID) | ORAL | 0 refills | Status: DC
  Filled 2023-07-22: qty 60, 30d supply, fill #0

## 2023-07-22 MED ORDER — VITAMIN D3 25 MCG PO TABS
4000.0000 [IU] | ORAL_TABLET | Freq: Two times a day (BID) | ORAL | 0 refills | Status: DC
  Filled 2023-07-22: qty 120, 15d supply, fill #0

## 2023-07-22 MED ORDER — ATORVASTATIN CALCIUM 20 MG PO TABS
20.0000 mg | ORAL_TABLET | Freq: Every day | ORAL | 0 refills | Status: AC
  Filled 2023-07-22: qty 30, 30d supply, fill #0

## 2023-07-22 NOTE — Progress Notes (Addendum)
 Patient ID: [name], female   DOB: February 18, 1951, 73 y.o.   MRN: [id number]  Team feel pt did so well on the outing they want to push up discharge. Have called daughter family education is scheduled for Sat will discharge after this. Will work on discharge needs.  Ordered placed for rollator

## 2023-07-22 NOTE — Telephone Encounter (Signed)
 CT was completed today at [facility]

## 2023-07-22 NOTE — Discharge Instructions (Addendum)
 Inpatient Rehab Discharge Instructions  [name] Discharge date and time:    Activities/Precautions/ Functional Status: Activity: no lifting, driving, or strenuous exercise till cleared by MD Diet: Heart Healthy/Thin liquids  Wound Care: none needed   Functional status:  ___ No restrictions     ___ Walk up steps independently _X__ 24/7 supervision/assistance   ___ Walk up steps with assistance ___ Intermittent supervision/assistance  ___ Bathe/dress independently ___ Walk with walker     ___ Bathe/dress with assistance ___ Walk Independently    ___ Shower independently _X__ Walk with assistance    _X__ Shower with assistance _X__ No alcohol     ___ Return to work/school ________  Special Instructions:  STROKE/TIA DISCHARGE INSTRUCTIONS SMOKING Cigarette smoking nearly doubles your risk of having a stroke & is the single most alterable risk factor  If you smoke or have smoked in the last 12 months, you are advised to quit smoking for your health. Most of the excess cardiovascular risk related to smoking disappears within a year of stopping. Ask you doctor about anti-smoking medications [place] Quit Line: 1-800-QUIT NOW Free Smoking Cessation Classes [phone]  CHOLESTEROL Know your levels; limit fat & cholesterol in your diet  Lipid Panel     Component Value Date/Time   CHOL 131 07/09/2023 0504   CHOL 209 (H) 11/22/2013 0232   TRIG 123 07/09/2023 0504   TRIG 99 11/22/2013 0232   HDL 38 (L) 07/09/2023 0504   HDL 56 11/22/2013 0232   CHOLHDL 3.4 07/09/2023 0504   VLDL 25 07/09/2023 0504   VLDL 20 11/22/2013 0232   LDLCALC 68 07/09/2023 0504   LDLCALC 133 (H) 11/22/2013 0232     Many patients benefit from treatment even if their cholesterol is at goal. Goal: Total Cholesterol (CHOL) less than 160 Goal:  Triglycerides (TRIG) less than 150 Goal:  HDL greater than 40 Goal:  LDL (LDLCALC) less than 100   BLOOD PRESSURE American Stroke Association blood pressure target  is less that 120/80 mm/Hg  Your discharge blood pressure is:  BP: 118/60 Monitor your blood pressure Limit your salt and alcohol intake Many individuals will require more than one medication for high blood pressure  DIABETES (A1c is a blood sugar average for last 3 months) Goal HGBA1c is under 7% (HBGA1c is blood sugar average for last 3 months)  Diabetes: No known diagnosis of diabetes    No results found for: "HGBA1C"  Your HGBA1c can be lowered with medications, healthy diet, and exercise. Check your blood sugar as directed by your physician Call your physician if you experience unexplained or low blood sugars.  PHYSICAL ACTIVITY/REHABILITATION Goal is 30 minutes at least 4 days per week  Activity: No driving, Therapies: see above Return to work: N/A Activity decreases your risk of heart attack and stroke and makes your heart stronger.  It helps control your weight and blood pressure; helps you relax and can improve your mood. Participate in a regular exercise program. Talk with your doctor about the best form of exercise for you (dancing, walking, swimming, cycling).  DIET/WEIGHT Goal is to maintain a healthy weight  Your discharge diet is:  Diet Order             Diet regular Room service appropriate? Yes; Fluid consistency: Thin  Diet effective now                   liquids Your height is:  Height: 5\' 1"  (154.9 cm) Your current weight  is: Weight: 57.7 kg Your Body Mass Index (BMI) is:  BMI (Calculated): 24.05 Following the type of diet specifically designed for you will help prevent another stroke. You are at goal weight  Your goal Body Mass Index (BMI) is 19-24. Healthy food habits can help reduce 3 risk factors for stroke:  High cholesterol, hypertension, and excess weight.  RESOURCES Stroke/Support Group:  Call [phone]   STROKE EDUCATION PROVIDED/REVIEWED AND GIVEN TO PATIENT Stroke warning signs and symptoms How to activate emergency medical system (call  [phone]). Medications prescribed at discharge. Need for follow-up after discharge. Personal risk factors for stroke. Pneumonia vaccine given:  Flu vaccine given:  My questions have been answered, the writing is legible, and I understand these instructions.  I will adhere to these goals & educational materials that have been provided to me after my discharge from the hospital.     My questions have been answered and I understand these instructions. I will adhere to these goals and the provided educational materials after my discharge from the hospital.  Patient/Caregiver Signature _______________________________ Date __________  Clinician Signature _______________________________________ Date __________  Please bring this form and your medication list with you to all your follow-up doctor's appointments.

## 2023-07-22 NOTE — Discharge Summary (Signed)
 Physician Discharge Summary  Patient ID: [name] MRN: [id number] DOB/AGE: 1950/09/05 73 y.o.  Admit date: 07/12/2023 Discharge date: 07/24/2023  Discharge Diagnoses:  Principal Problem:   Subdural hematoma (HCC) Active Problems:   Primary hypertension   HYPERTENSION, CONTROLLED   Anxiety and depression   Dyslipidemia   Discharged Condition: stable  Significant Diagnostic Studies: CT HEAD WO CONTRAST ( ) Result Date: 07/22/2023 CLINICAL DATA:  Head trauma follow-up. EXAM: CT HEAD WITHOUT CONTRAST TECHNIQUE: Contiguous axial images were obtained from the base of the skull through the vertex without intravenous contrast. RADIATION DOSE REDUCTION: This exam was performed according to the departmental dose-optimization program which includes automated exposure control, adjustment of the mA and/or kV according to patient size and/or use of iterative reconstruction technique. COMPARISON:  07/06/2023 FINDINGS: Brain: Smaller and lower density subdural hematoma along the parasagittal left cerebral hemisphere, 5 mm in maximal thickness. Collection with calcification or high-density blood products beneath the craniotomy flap have also slightly regressed. Continued mild mass effect on the left cerebral hemisphere without significant midline shift. Low-density in the left frontal lobe has subsided. No hydrocephalus or infarct. Vascular: No hyperdense vessel or unexpected calcification. Skull: Unremarkable left-sided craniotomy site. Sinuses/Orbits: Negative IMPRESSION: Expected evolution with regression of subdural hematoma on the left. No new or progressive finding. Electronically Signed   By: Tiburcio Pea M.D.   On: 07/22/2023 08:30     Labs:  Basic Metabolic Panel:    Latest Ref Rng & Units 07/22/2023    6:02 AM 07/19/2023    6:17 AM 07/15/2023    6:05 AM  BMP  Glucose 70 - 99 mg/dL 91  86  401   BUN 8 - 23 mg/dL 14  14  28    Creatinine 0.44 - 1.00 mg/dL 0.27  2.53  6.64   Sodium 135  - 145 mmol/L 140  137  136   Potassium 3.5 - 5.1 mmol/L 3.8  3.7  4.5   Chloride 98 - 111 mmol/L 107  102  101   CO2 22 - 32 mmol/L 30  24  25    Calcium 8.9 - 10.3 mg/dL 9.4  9.3  9.5      CBC:    Latest Ref Rng & Units 07/22/2023    6:02 AM 07/19/2023    6:17 AM 07/15/2023    6:05 AM  CBC  WBC 4.0 - 10.5 K/uL 9.1  9.0  11.3   Hemoglobin 12.0 - 15.0 g/dL 40.3  47.4  25.9   Hematocrit 36.0 - 46.0 % 35.1  36.7  36.4   Platelets 150 - 400 K/uL 288  354  492      CBG: No results for input(s): "GLUCAP" in the last 168 hours.  Brief HPI:   [name] is a 73 y.o. female with history of HTN, DVT, esophageal stricture, OSA, depression; who was admitted on 06/25/2023. She with reports of fall 3 to 4 days prior to admission with headaches and mental status changes.  She had seizure type activity in ED and was found to have a large left cerebral SDH measuring 1.1 x 1.2 cm and rightward midline shift with early uncal herniation and mass effect on left lateral ventricular system.  Eliquis reversed, she was transfused with 1 units PRBC for anemia with hemoglobin of 6 and taken to the OR emergently for craniotomy for evacuation of hematoma.  Postop required Cardene drip for BP control and had issues with pain and agitation.  Repeat CT showed no significant  change.  EEG was negative for seizures.  E. coli/Citrobacter UTI treated with 5-day course of ceftriaxone she continued to have fluctuating mental status and and fevers with urine showing > 100,000 colonies of enterococcus felt to be contaminant.  Dr. Marcell Barlow questioned need for MMA embolization pending repeat CT head in 4 weeks.  PAF managed with addition of metoprolol added for rate control and MRI brain repeated on 02/26 revealing small acute cortical infarcts in left frontal and parietal lobe. She was started on IV heparin per neurology input and Apixaban resumed on 02/28.   Hospital Course: [name] was admitted to rehab 07/12/2023 for  inpatient therapies to consist of PT, ST and OT at least three hours five days a week. Past admission physiatrist, therapy team and rehab RN have worked together to provide customized collaborative inpatient rehab.  Cranial incision is clean dry and intact and has been healing well without signs or symptoms of infection. Blood pressures were monitored on TID basis and she was improvement in control on metoprolol alone.  She reported dysuria as well as hesitancy with difficulty voiding at admission. She was treated with 3-day course of Keflex for symptomatic UTI as well as pyridium for 3 days. Toilet ing program started with PVR ordered to monitor for signs of retention. She has been voiding without retention and symptoms have resolved. She was tolerating D2 diet and nutritional supplements. Diet has been advanced to regular and she is tolerating this without difficulty.   Follow-up check of electrolytes showed mild hyponatremia which has resolved on recheck.  Renal status is stable.  Follow-up CBC shows hemoglobin to be stable and leukocytosis is resolved.  She is tolerating Eliquis without any signs of bleeding.  Repeat CT of head ordered on 03/13 for follow-up and showed evolution and progression of SDH.    She has been seizure-free on Keppra and will complete her seizure prophylaxis course in 1 to 2 days after discharge.  Headaches have been managed with use of Topamax. Voltaren gel was added to help manage right shoulder pain. Anxiety has improved and she was weaned off Seroquel prior to discharge.  Sleep-wake disruption is improving with as needed use of trazodone.  Expressive aphasia is resolving with improvement in ability to express needs, right inattention and apraxia has greatly improved.  She has made steady gains during her stay and has progressed to modified independent in supervised setting. She will continue to receive outpatient PT, OT and ST at [facility] outpatient rehab after discharge.    Rehab  course: During patient's stay in rehab weekly team conferences were held to monitor patient's progress, set goals and discuss barriers to discharge. At admission, patient required mod to max assist with ADL task and min assist with mobility.She exhibited functional max education and swallowing and was advanced to D3 intermittent supervision and GI precautions.  She presented with moderate expressive deficits with semantic and phenomena paraphasias and mild apraxia. She  has had improvement in activity tolerance, balance, postural control as well as ability to compensate for deficits. She has had improvement in functional use RUE as well as improvement in awareness. She is able to complete ADL tasks at modified independent level. She is modified independent for transfers and requires supervision with cues for safety to ambulate 1000 with use of rollater. Swallow function has improved and she was advanced to regular textures which she is tolerating without S/S of aspiration. She has had improvement in aphasia and requires supervisory assistance for expression to  communication and utilize compensatory strategies. Family education has been completed and CGA/ to close supervision with mobility recommended if patient is fatigued.    Discharge disposition: 01-Home or Self Care  Diet:  Heart healthy, thin liquids  Special Instructions: Continue Keppra for 3 more doses then stop. No alcohol, smoking or driving till cleared by MD. Avoid strenuous activity.   Discharge Instructions     Ambulatory referral to Neurology   Complete by: As directed    An appointment is requested in approximately: 4 weeks   Ambulatory referral to Physical Medicine Rehab   Complete by: As directed    Hospital follow up      Allergies as of 07/24/2023       Reactions   Dicyclomine Other (See Comments)   REACTION: Choking   Lisinopril Other (See Comments)   REACTION: Choking   Losartan Shortness Of Breath   Ace Inhibitors  Other (See Comments)   Unknown to patient   Penicillins Itching, Rash        Medication List     STOP taking these medications    albuterol 108 (90 Base) MCG/ACT inhaler Commonly known as: VENTOLIN HFA   isosorbide mononitrate 30 MG 24 hr tablet Commonly known as: IMDUR   levETIRAcetam 100 MG/ML solution Commonly known as: KEPPRA       TAKE these medications    apixaban 5 MG Tabs tablet Commonly known as: ELIQUIS Take 1 tablet (5 mg total) by mouth 2 (two) times daily.   atorvastatin 20 MG tablet Commonly known as: LIPITOR Take 1 tablet (20 mg total) by mouth daily with supper. What changed: when to take this   citalopram 20 MG tablet Commonly known as: CELEXA Take 1 tablet (20 mg total) by mouth daily.   diclofenac Sodium 1 % Gel Commonly known as: VOLTAREN Apply 2 g topically 4 (four) times daily. To right shoulder   Ensure Max Protein Liqd Take 330 mLs (11 oz total) by mouth 2 (two) times daily. What changed:  how much to take how to take this when to take this   famotidine 20 MG tablet Commonly known as: PEPCID Take 1 tablet (20 mg total) by mouth 2 (two) times daily. What changed: when to take this   free water Soln Place 60 mLs into feeding tube 3 (three) times daily after meals. Notes to patient: Use filtered (not distilled) water to flush the feeding tube 2-3 times a day.    LORazepam 0.5 MG tablet Commonly known as: ATIVAN Take 0.5 mg by mouth every 8 (eight) hours as needed. For anxiety   metoprolol succinate 25 MG 24 hr tablet Commonly known as: TOPROL-XL Take 1 tablet (25 mg total) by mouth daily. What changed: See the new instructions.   vitamin D3 25 MCG tablet Commonly known as: CHOLECALCIFEROL Take 4 tablets (4,000 Units total) by mouth 2 (two) times daily.        Follow-up Information     Venetia Night, MD Follow up on 07/29/2023.   Specialty: Neurosurgery Why: appointment at 10;20 am Contact information: [address] [place] [address] [phone]         Erick Colace, MD Follow up.   Specialty: Physical Medicine and Rehabilitation Why: office will call you with follow up appointment Contact information: [address] [place] [address] [phone]         [name], MD. Call.   Specialty: Internal Medicine Why: Monday for hospital  follow up Contact information: [address] [facility] [address] [facility] [place] [address] [phone]                 Signed: Jacquelynn Cree 07/26/2023, 11:44 AM

## 2023-07-22 NOTE — Progress Notes (Signed)
 PROGRESS NOTE   Subjective/Complaints:  Repeat CT showed expected evolution, discussed with pt showing improvement  ROS-denies CP, SOB, N/V/D Objective:   CT HEAD WO CONTRAST ( ) Result Date: 07/22/2023 CLINICAL DATA:  Head trauma follow-up. EXAM: CT HEAD WITHOUT CONTRAST TECHNIQUE: Contiguous axial images were obtained from the base of the skull through the vertex without intravenous contrast. RADIATION DOSE REDUCTION: This exam was performed according to the departmental dose-optimization program which includes automated exposure control, adjustment of the mA and/or kV according to patient size and/or use of iterative reconstruction technique. COMPARISON:  07/06/2023 FINDINGS: Brain: Smaller and lower density subdural hematoma along the parasagittal left cerebral hemisphere, 5 mm in maximal thickness. Collection with calcification or high-density blood products beneath the craniotomy flap have also slightly regressed. Continued mild mass effect on the left cerebral hemisphere without significant midline shift. Low-density in the left frontal lobe has subsided. No hydrocephalus or infarct. Vascular: No hyperdense vessel or unexpected calcification. Skull: Unremarkable left-sided craniotomy site. Sinuses/Orbits: Negative IMPRESSION: Expected evolution with regression of subdural hematoma on the left. No new or progressive finding. Electronically Signed   By: Tiburcio Pea M.D.   On: 07/22/2023 08:30    Recent Labs    07/22/23 0602  WBC 9.1  HGB 12.1  HCT 35.1*  PLT 288    Recent Labs    07/22/23 0602  NA 140  K 3.8  CL 107  CO2 30  GLUCOSE 91  BUN 14  CREATININE 0.93  CALCIUM 9.4     Intake/Output Summary (Last 24 hours) at 07/22/2023 0836 Last data filed at 07/21/2023 1830 Gross per 24 hour  Intake 960 ml  Output --  Net 960 ml        Physical Exam: Vital Signs Blood pressure (!) 148/60, pulse 63, temperature  98 F (36.7 C), resp. rate 18, height 5\' 1"  (1.549 m), weight 57.7 kg, SpO2 98%.  General: No acute distress Mood and affect are appropriate Heart: Regular rate and rhythm no rubs murmurs or extra sounds Lungs: Clear to auscultation, breathing unlabored, no rales or wheezes Abdomen: Positive bowel sounds, soft nontender to palpation, nondistended Extremities: No clubbing, cyanosis, or edema Skin: No evidence of breakdown, no evidence of rash  Moderate aphasia Skin: No evidence of breakdown, no evidence of rash Neurologic: Cranial nerves II through XII intact, motor strength is 5/5 in left and 4-/5 right  deltoid, bicep, tricep, grip, 5/5 left and 4/5 right hip flexor, knee extensors, ankle dorsiflexor and plantar flexor   Moderate exp aphasia   Musculoskeletal: pain with overhead reaching R shoulder  No joint swelling Full finger ext , wrist ROM mildly reduced extension , able to flex    Assessment/Plan: 1. Functional deficits which require 3+ hours per day of interdisciplinary therapy in a comprehensive inpatient rehab setting. Physiatrist is providing close team supervision and 24 hour management of active medical problems listed below. Physiatrist and rehab team continue to assess barriers to discharge/monitor patient progress toward functional and medical goals  Care Tool:  Bathing    Body parts bathed by patient: Right arm, Chest, Abdomen, Front perineal area, Right upper leg, Left upper leg, Face, Buttocks, Left arm, Left  lower leg, Right lower leg   Body parts bathed by helper: Left arm, Buttocks, Right upper leg, Left upper leg     Bathing assist Assist Level: Contact Guard/Touching assist     Upper Body Dressing/Undressing Upper body dressing   What is the patient wearing?: Pull over shirt    Upper body assist Assist Level: Supervision/Verbal cueing    Lower Body Dressing/Undressing Lower body dressing      What is the patient wearing?: Underwear/pull up,  Pants     Lower body assist Assist for lower body dressing: Contact Guard/Touching assist     Toileting Toileting    Toileting assist Assist for toileting: Supervision/Verbal cueing     Transfers Chair/bed transfer  Transfers assist     Chair/bed transfer assist level: Minimal Assistance - Patient > 75%     Locomotion Ambulation   Ambulation assist      Assist level: Contact Guard/Touching assist Assistive device: Walker-rolling Max distance: 75   Walk 10 feet activity   Assist     Assist level: Contact Guard/Touching assist Assistive device: Walker-rolling   Walk 50 feet activity   Assist    Assist level: Contact Guard/Touching assist Assistive device: Walker-rolling    Walk 150 feet activity   Assist Walk 150 feet activity did not occur: Safety/medical concerns (d/t pt fatigue and cognition)         Walk 10 feet on uneven surface  activity   Assist Walk 10 feet on uneven surfaces activity did not occur: Safety/medical concerns (d/t pt fatigue and cognition)         Wheelchair     Assist Is the patient using a wheelchair?: Yes Type of Wheelchair: Manual    Wheelchair assist level: Dependent - Patient 0% Max wheelchair distance: 150    Wheelchair 50 feet with 2 turns activity    Assist        Assist Level: Dependent - Patient 0%   Wheelchair 150 feet activity     Assist      Assist Level: Dependent - Patient 0%   Blood pressure (!) 148/60, pulse 63, temperature 98 F (36.7 C), resp. rate 18, height 5\' 1"  (1.549 m), weight 57.7 kg, SpO2 98%.  Medical Problem List and Plan: 1. 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  Neuro deficits are related to SDH              -patient may shower, cover G tube             -ELOS/Goals: 3/19 Sup PT, Sup-Min A OT/ST             Con't  CIR team conf in am            Repeat CT head expected evolution  2.  DVT/A fib/Antithrombotics: -DVT/anticoagulation:  Pharmaceutical: Eliquis resumed on 02/28 POD#14             -antiplatelet therapy:  N/A 3. Headaches/Pain Management: Tylenol prn not fully effective.              --will add low dose Topamax and titrate as needed.  --continue Seroquel with prn trazodone.  4. Mood/Behavior/Sleep: LCSW to follow for evaluation and support.              -antipsychotic agents: On Seroquel to help with sleep.  5. Neuropsych/cognition: This patient is capable of making  decisions on her own behalf. 6. Skin/Wound Care: Routine pressure relief measures.  7. Fluids/Electrolytes/Nutrition: Monitor I/O. Labs essentially normal     Latest Ref Rng & Units 07/22/2023    6:02 AM 07/19/2023    6:17 AM 07/15/2023    6:05 AM  BMP  Glucose 70 - 99 mg/dL 91  86  027   BUN 8 - 23 mg/dL 14  14  28    Creatinine 0.44 - 1.00 mg/dL 2.53  6.64  4.03   Sodium 135 - 145 mmol/L 140  137  136   Potassium 3.5 - 5.1 mmol/L 3.8  3.7  4.5   Chloride 98 - 111 mmol/L 107  102  101   CO2 22 - 32 mmol/L 30  24  25    Calcium 8.9 - 10.3 mg/dL 9.4  9.3  9.5   Normal BUN- meeting po fluid and caloric needs off TF taking pills po  8. SDH s/p crani 2/14: Repeat CT head in 4 weeks- later this week  --May need MMA embolization in the future per NS.  9. Seizure activity in ED?: On Keppra for seizure prophylaxis. 10. HTN: Poorly controlled--on metoprolol XL 25mg . SBP goal < 160             --was on Imdur and aldactone PTA 11.  PAF: Monitor HR TID--on metoprolol.              -HR 90s, consider increase metoprolol dose  3/8- HR running 70s in last 24 hours and BP in 130s- will wait to increase B blocker  3/9- HR 90s and BP controlled- con't regimen     07/12/2023    7:35 PM 07/12/2023    4:16 PM 07/12/2023    4:02 PM  Vitals with BMI  Height   5\' 1"     Weight   131 lbs 10 oz 131 lbs 10 oz  BMI   24.88 24.88  Systolic 132 143 474   Diastolic 69 78 78  Pulse 96 92 92      12. Urinary retention: Unable to use bed pan and reports problems with urination.             --will order bladder scans, PVR checks. Start treatment for UTI.  13. Enterococcus UTI: UA/UCS positive 02/26 and 03/01--> 100,000 colonies and symptomatic. Prior treatment with ceftriaxone --will start Keflex as well as pyridium for dysuria/hesitancy.              --Needs to get to [facility] to be able to void.  Mild leukocytosis afebrile on Keflex   3/8- labs again Monday- is afebrile 14. Seizure prophylaxis: Continue Keppra 15. Dysphagia: On dysphagia 2 diet with thin liquids. Will change pills to po route. PEG tube placed 07/05/23.  May remove on or after 4/7 can be done in IR dept at [facility]  16. H/o anxiety/Depression: Continue Celexa.  17. ABLA/Hx of IDA:   S/p 2 u PRBC. Resolved     Latest Ref Rng & Units 07/22/2023    6:02 AM 07/19/2023    6:17 AM 07/15/2023    6:05 AM  CBC  WBC 4.0 - 10.5 K/uL 9.1  9.0  11.3   Hemoglobin 12.0 - 15.0 g/dL 25.9  56.3  87.5   Hematocrit 36.0 - 46.0 % 35.1  36.7  36.4   Platelets 150 - 400 K/uL 288  354  492     18.Right RTC tear as well as AC jt OA : Add voltaren gel to  help with pain. No pain with overhead activity   Improved symptomatically 3/13    19.  Pt c/o diarrhea, now resolved , continent every day stools  20. L foot drop  Improved may d/c PRAFO  21. L hand/wrist drop  Resolved  22.  Probable CTS , wrist splint at night 23.  Numbness tingling in feet, chronic suspect neuropathy , may trial low dose gabapentin at night    LOS: 10 days A FACE TO FACE EVALUATION WAS PERFORMED  Erick Colace 07/22/2023, 8:36 AM

## 2023-07-22 NOTE — Progress Notes (Signed)
 Physical Therapy Session Note  Patient Details  Name: [name] MRN: [id number] Date of Birth: 15-Mar-1951  Today's Date: 07/22/2023 PT Missed Time: 30 Minutes Missed Time Reason: Patient fatigue  Pt politely declines AM therapy session. Reports she's been up since 0400 getting tests and labs done. She's aware of her scheduled outing later this AM. She missed 30 minutes of PT treatment. Will attempt to make up as schedule and pt availability permits.    Therapy/Group: Individual Therapy  Orrin Brigham 07/22/2023, 7:55 AM

## 2023-07-22 NOTE — Plan of Care (Signed)
  Problem: RH BOWEL ELIMINATION Goal: RH STG MANAGE BOWEL WITH ASSISTANCE Description: STG Manage Bowel with Assistance. Outcome: Progressing   Problem: RH BLADDER ELIMINATION Goal: RH STG MANAGE BLADDER WITH ASSISTANCE Description: STG Manage Bladder With Assistance Outcome: Progressing   Problem: RH SAFETY Goal: RH STG ADHERE TO SAFETY PRECAUTIONS W/ASSISTANCE/DEVICE Description: STG Adhere to Safety Precautions With Assistance/Device. Outcome: Progressing   Problem: RH PAIN MANAGEMENT Goal: RH STG PAIN MANAGED AT OR BELOW PT'S PAIN GOAL Outcome: Progressing   Problem: RH KNOWLEDGE DEFICIT BRAIN INJURY Goal: RH STG INCREASE KNOWLEDGE OF SELF CARE AFTER BRAIN INJURY Outcome: Progressing   Problem: RH KNOWLEDGE DEFICIT Goal: RH STG INCREASE KNOWLEDGE OF HYPERTENSION Outcome: Progressing

## 2023-07-22 NOTE — Patient Care Conference (Addendum)
 Inpatient RehabilitationTeam Conference and Plan of Care Update Date: 07/21/2023   Time: 10:45 AM    Patient Name: [name]      Medical Record Number: [id number]  Date of Birth: May 31, 1950 Sex: Female         Room/Bed: 4W18C/4W18C-01 Payor Info: Payor: MEDICARE / Plan: MEDICARE PART A AND B / Product Type: *No Product type* /    Admit Date/Time:  07/12/2023  2:50 PM  Primary Diagnosis:  Subdural hematoma [facility])  Hospital Problems: Principal Problem:   Subdural hematoma [facility])    Expected Discharge Date: Expected Discharge Date: 07/26/23  Team Members Present: Physician leading conference: Dr. Claudette Laws Social Worker Present: Dossie Der, LCSW Nurse Present: Chana Bode, RN PT Present: Ralph Leyden, PT OT Present: Mariann Barter, OT SLP Present: Everardo Pacific, SLP PPS Coordinator present : Fae Pippin, SLP     Current Status/Progress Goal Weekly Team Focus  Bowel/Bladder   Pt is continent of bowel and bladder   Pt will maintian continence   Will assess qshift and PRN    Swallow/Nutrition/ Hydration   regular/thin   supervision  tolerance    ADL's   UB BADLs supervision, LB BADLs CGA no AD, ambulatory transfers CGA no AD, toileting CGA   superivsion overall   family education, R hemibody NMRE, BADL retraiing, activity tolerance // Barriers: dynamic balance, safety awareness, R hemibody weakness; she is very motivated and an extremely hard worker    Mobility   Bed mobility = supervision, Transfers = CGA/ supervision, Ambulation = CGA for up to 250 ft with no AD, or supervision with rollator, stairs = CGA using BHR   overall supervision  Barriers: consistent sleep overnight, decreased standing balance, reduced but improving activity tolerance /// Work on: NMR for R hemibody, standing balance and balance strategies, LOA and improving technique with all mobility, gait training, family education    Communication   expressive language- aphasia, apraxia    min   use of strategies in conversation    Safety/Cognition/ Behavioral Observations  min   supervision A   memory, awareness of deficits and errors in language    Pain   Pt denies pain   Pt remains pain free   Assess pain qshift and PRN    Skin   Skin is intact   Maintain Skin Integrity  Assess qshift and PRN      Discharge Planning:  Going home with daughter who will plan to provide 24/7 supervision. Was asking for how long? Will schedule her for family training. Await team's recommendations   Team Discussion: Patient post craniotomy for management of a SDH with aphasia and dysphagia with PEG placement and hemiparesis. Patient doing well overall, great motor return noted but limited by hand pain , memory deficits and balance recovery issues with awareness issues.  Patient on target to meet rehab goals: yes, currently needs supervision for upper body care and CGA for lower body care. Transfers with supervision with and without an assistive device.   *See Care Plan and progress notes for long and short-term goals.   Revisions to Treatment Plan:  CT of head  07/22/23; f/u [facility] Community outing 07/22/23 PEG flushes through 08/16/23 Advanced to regular texture,thin liquid diet Resting hand splint  Teaching Needs: Safety, medications, dietary modifications, transfers, toileting, PEG care/skin care, etc.   Current Barriers to Discharge: Decreased caregiver support  Possible Resolutions to Barriers: Family education OP follow up services at [facility] DME: Rollator     Medical Summary  Current Status: Right shoiulder pain improved, eating well no need for PEG feeds, RUE and RLE strength improving  Barriers to Discharge: Medical stability   Possible Resolutions to Barriers/Weekly Focus: dysphagia improved, intake better, will need caregiver training   Continued Need for Acute Rehabilitation Level of Care: The patient requires daily medical management by a physician with  specialized training in physical medicine and rehabilitation for the following reasons: Direction of a multidisciplinary physical rehabilitation program to maximize functional independence : Yes Medical management of patient stability for increased activity during participation in an intensive rehabilitation regime.: Yes Analysis of laboratory values and/or radiology reports with any subsequent need for medication adjustment and/or medical intervention. : Yes   I attest that I was present, lead the team conference, and concur with the assessment and plan of the team.   Chana Bode B 07/22/2023, 2:44 PM

## 2023-07-22 NOTE — Progress Notes (Signed)
 Speech Language Pathology Daily Session Note  Patient Details  Name: ERMINIE FOULKS MRN: [id number] Date of Birth: 06-25-1950  Today's Date: 07/22/2023 SLP Individual Time: 1330-1430 SLP Individual Time Calculation (min): 60 min  Short Term Goals: Week 2: SLP Short Term Goal 1 (Week 2): STGs = LTGs d/t ELOS  Skilled Therapeutic Interventions: Skilled therapy session focused on cognitive goals. SLP facilitated session by providing supervision A during problem solving and memory task. SLP encouraged patient to utilize hospital signs to navigate to designations verbalized by SLP. At each location, SLP prompted patient to complete cognitive activity and remember "fun facts." Upon return to room, patient recalled "fun facts" from around the hospital given supervision A. This is indicative of improvements in memory. Patient requested transfer to bathroom. Patient left in bed with alarm set and call bell in reach. Continue POC.  Pain None repored  Therapy/Group: Individual Therapy  Ginevra Tacker M.A., CF-SLP 07/22/2023, 7:43 AM

## 2023-07-22 NOTE — Progress Notes (Signed)
 Occupational Therapy Session Note  Patient Details  Name: NATAKI MCCRUMB MRN: [id number] Date of Birth: 05/02/1951  Today's Date: 07/22/2023 OT Individual Time: 1000-1200 OT Individual Time Calculation (min): 120 min    Short Term Goals: Week 2:  OT Short Term Goal 1 (Week 2): STG=LTG d/t ELOS  Skilled Therapeutic Interventions/Progress Updates:     Pt received lightly sleeping in bed, waking upon OT arrival. Pt presenting to be in good spirits receptive to skilled OT session reporting 0/10 pain- OT offering intermittent rest breaks, repositioning, and therapeutic support to optimize participation in therapy session. Focus this session BADL retraining and community reintegration.   Pt transitioned to EOB supervision without use of bed features and ambulated to bathroom using rollator distant supervision. Pt able to complete 3/3 toileting tasks with distant supervision following continent void. Pt requesting to wash up and change into clean clothes this AM with sponge bath completed sitting at sink for energy conservation. Pt able to doff clothing in seated position and complete U/LB bathing with supervision, standing only to wash buttocks and peri-areas. Pt donned shirt, pants, and socks/shoes with supervision +increased time this session without use of AD.  Pt participated in  Community reintegration/outing to [employer] at overall supervision level using a rollator.  Goals focused on safe community mobility, communication, R UE functional use, dynamic balance, identification & negotiation of obstacles, energy conservation techniques/education.  Pt ambulated throughout the store and in the parking lot with supervision, min cues for safety awareness and to initiate taking rest break. Pt able to maintain dynamic balance using rollator while retrieving items and  trying on clothing items with supervision, no LOB. Pt able to appropriately request assistance from store employees to locate items and recall  three items from a list with supervision. Pt stated appreciation for the outing and found it helpful in returning to the community. All goals for community reintegration met. See outing goal sheet in shadow chart for full details. Pt was left resting in recliner with call bell in reach, seatbelt alarm on, and all needs met.    Therapy Documentation Precautions:  Precautions Precautions: Fall Recall of Precautions/Restrictions: Impaired Restrictions Weight Bearing Restrictions Per Provider Order: No Other Position/Activity Restrictions: LLQ G-tube placed 07/05/23    Therapy/Group: Individual Therapy  Clide Deutscher 07/22/2023, 12:17 PM

## 2023-07-22 NOTE — Progress Notes (Signed)
 Recreational Therapy Session Note  Patient Details  Name: [name] MRN: [id number] Date of Birth: May 24, 1950 Today's Date: 07/22/2023  Pain: no c/o Skilled Therapeutic Interventions/Progress Updates: Pt participated in Community reintegration/outing to [employer] at overall supervision ambulatory level using Rollator.  Goals focused on safe community mobility, identification & negotiation of obstacles, energy conservation techniques/education, cognition, communication..  See outing goal sheet in shadow chart for full details.  Therapy/Group: [employer]   Norvin Ohlin 07/22/2023, 3:52 PM

## 2023-07-23 ENCOUNTER — Other Ambulatory Visit (HOSPITAL_COMMUNITY): Payer: Self-pay

## 2023-07-23 MED ORDER — LEVETIRACETAM 500 MG PO TABS
500.0000 mg | ORAL_TABLET | Freq: Two times a day (BID) | ORAL | Status: DC
  Administered 2023-07-23 – 2023-07-24 (×2): 500 mg via ORAL
  Filled 2023-07-23 (×2): qty 1

## 2023-07-23 MED ORDER — LEVETIRACETAM 500 MG PO TABS
500.0000 mg | ORAL_TABLET | Freq: Two times a day (BID) | ORAL | 0 refills | Status: DC
  Filled 2023-07-23: qty 3, 2d supply, fill #0

## 2023-07-23 MED ORDER — METOPROLOL SUCCINATE ER 25 MG PO TB24
25.0000 mg | ORAL_TABLET | Freq: Every day | ORAL | 0 refills | Status: DC
  Filled 2023-07-23: qty 30, 30d supply, fill #0

## 2023-07-23 MED ORDER — METOPROLOL SUCCINATE ER 25 MG PO TB24
25.0000 mg | ORAL_TABLET | Freq: Every day | ORAL | Status: DC
  Administered 2023-07-23 – 2023-07-24 (×2): 25 mg via ORAL
  Filled 2023-07-23: qty 1

## 2023-07-23 NOTE — Progress Notes (Signed)
 Speech Language Pathology Discharge Summary  Patient Details  Name: [name] MRN: [id number] Date of Birth: 02/09/1951  Date of Discharge from SLP service:July 24, 2023   Patient has met 5 of 5 long term goals.  Patient to discharge at overall Supervision;Min level.  Reasons goals not met: n/a   Clinical Impression/Discharge Summary:  Pt has made excellent gains and has met 5 of 5 LTG's this admission due to improved dysphagia, communication and cognition. Pt is currently an overall supervisionA for cognitive tasks and requires supervision-mod i cues for utilization of swallowing compensatory strategies to minimize overt s/sx of aspiration with regular/thin diet. Patient continues to require supervision A during expressive communication, though is now increasingly aware of motor errors. Pt/family education complete and pt will discharge home with supervision from friends/family/etc. Pt would benefit from OP f/u ST services to maximize communication (mild apraxia) in order to maximize functional independence.   Care Partner:  Caregiver Able to Provide Assistance: Yes  Type of Caregiver Assistance: Physical;Cognitive  Recommendation:  Outpatient SLP  Rationale for SLP Follow Up: Maximize functional communication   Equipment: n/a   Reasons for discharge: Treatment goals met;Discharged from hospital   Patient/Family Agrees with Progress Made and Goals Achieved: Yes    Shatonya Passon M.A., CF-SLP 07/23/2023, 9:19 AM

## 2023-07-23 NOTE — Progress Notes (Signed)
 Occupational Therapy Session Note  Patient Details  Name: [name] MRN: [id number] Date of Birth: [birth year]  Today's Date: 07/23/2023 OT Individual Time: 6578-4696 OT Individual Time Calculation (min): 59 min    Short Term Goals: Week 2:  OT Short Term Goal 1 (Week 2): STG=LTG d/t ELOS  Skilled Therapeutic Interventions/Progress Updates:     Pt received sitting up in bed presenting to be in good spirits receptive to skilled OT session reporting 0/10 pain- OT offering intermittent rest breaks, repositioning, and therapeutic support to optimize participation in therapy session. Focus this session on BADL retraining and IADL retraining with emphasis on safety awareness and activity tolerance. Pt able to ambulate within her room using rollator and retrieve clean clothing items from drawers mod I- Pt with improved safety awareness and positioning of rollator this session and able to recall need to lock rollator breaks without verbal cues. Pt completed functional mobility to bathroom using rollator mod I and completed 3/3 toileting tasks mod I +increased time following continent void- documented in flowsheets. Pt completed ambulatory transfer to TTB positioned in walk-in shower without AD distant supervision and doffed clothing in seated position mod I. Pt able to complete U/LB bathing with distant supervision +increased time for energy conservation utilizing R UE at a diminished level with supervision. Following shower, Pt able to dry self in seated position mod I and ambulate to EOB using rollator mod I. Pt completed U/LB dressing seated EOB mod I +increased time. Engaged Pt in cleaning tasks to simulate home management skills- Pt tasked with ambulating around bathroom to retrieve used towels and wash clothes using rollator, transporting towels to laundry bag, and then placing towel one at a time into laundry bag. Pt able to complete activity with close supervision, no LOB with appropriate problem  solving and safety awareness skills noted. Pt fatigued at end of session, requesting to return to bed. EOB > supine mod I. Pt was left resting in bed with call bell in reach, bed alarm on, and all needs met.    Therapy Documentation Precautions:  Precautions Precautions: Fall Recall of Precautions/Restrictions: Impaired Restrictions Weight Bearing Restrictions Per Provider Order: No Other Position/Activity Restrictions: LLQ G-tube placed 07/05/23   Therapy/Group: Individual Therapy  Clide Deutscher 07/23/2023, 8:01 AM

## 2023-07-23 NOTE — Progress Notes (Signed)
 Recreational Therapy Discharge Summary Patient Details  Name: [name] MRN: [id number] Date of Birth: 05-20-1950 Today's Date: 07/23/2023 Comments on progress toward goals: Pt has made good progress during LOS and is ready for discharge home with family at overall supervision ambulatory level.  TR sessions focused on pt education and community reintegration.  Pt participated in a community outing to [employer] at overall supervision level using rollator.  Pt ambulated throughout [employer] with one brief seated rest break on the rollator.   Education included energy conservation, safe community mobility.  Pt is excited about discharge and looking forward to returning to previously enjoyed activities.  Reasons for discharge: discharge from hospital  Follow-up: Outpatient  Patient/family agrees with progress made and goals achieved: Yes  Terita Hejl 07/23/2023, 12:51 PM

## 2023-07-23 NOTE — Progress Notes (Addendum)
 PROGRESS NOTE   Subjective/Complaints:  Pt is feeling ok excited about D/C home dose of metoprolol discussed usually take 25mg  BID, now on 25mg  but has had several elevated systolic BPs 140-150 range, this am is lower and pulse is lower  ROS-denies CP, SOB, N/V/D Objective:   CT HEAD WO CONTRAST ( ) Result Date: 07/22/2023 CLINICAL DATA:  Head trauma follow-up. EXAM: CT HEAD WITHOUT CONTRAST TECHNIQUE: Contiguous axial images were obtained from the base of the skull through the vertex without intravenous contrast. RADIATION DOSE REDUCTION: This exam was performed according to the departmental dose-optimization program which includes automated exposure control, adjustment of the mA and/or kV according to patient size and/or use of iterative reconstruction technique. COMPARISON:  07/06/2023 FINDINGS: Brain: Smaller and lower density subdural hematoma along the parasagittal left cerebral hemisphere, 5 mm in maximal thickness. Collection with calcification or high-density blood products beneath the craniotomy flap have also slightly regressed. Continued mild mass effect on the left cerebral hemisphere without significant midline shift. Low-density in the left frontal lobe has subsided. No hydrocephalus or infarct. Vascular: No hyperdense vessel or unexpected calcification. Skull: Unremarkable left-sided craniotomy site. Sinuses/Orbits: Negative IMPRESSION: Expected evolution with regression of subdural hematoma on the left. No new or progressive finding. Electronically Signed   By: Tiburcio Pea M.D.   On: 07/22/2023 08:30    Recent Labs    07/22/23 0602  WBC 9.1  HGB 12.1  HCT 35.1*  PLT 288    Recent Labs    07/22/23 0602  NA 140  K 3.8  CL 107  CO2 30  GLUCOSE 91  BUN 14  CREATININE 0.93  CALCIUM 9.4     Intake/Output Summary (Last 24 hours) at 07/23/2023 0759 Last data filed at 07/22/2023 1659 Gross per 24 hour  Intake  476 ml  Output --  Net 476 ml        Physical Exam: Vital Signs Blood pressure (!) 123/54, pulse (!) 56, temperature 98.4 F (36.9 C), resp. rate 16, height 5\' 1"  (1.549 m), weight 57.9 kg, SpO2 99%.  General: No acute distress Mood and affect are appropriate Heart: Regular rate and rhythm no rubs murmurs or extra sounds Lungs: Clear to auscultation, breathing unlabored, no rales or wheezes Abdomen: Positive bowel sounds, soft nontender to palpation, nondistended Extremities: No clubbing, cyanosis, or edema Skin: No evidence of breakdown, no evidence of rash  Moderate aphasia Skin: No evidence of breakdown, no evidence of rash Neurologic: Cranial nerves II through XII intact, motor strength is 5/5 in left and 4-/5 right  deltoid, bicep, tricep, grip, 5/5 left and 4/5 right hip flexor, knee extensors, ankle dorsiflexor and plantar flexor   Moderate exp aphasia   Musculoskeletal: pain with overhead reaching R shoulder  No joint swelling Full finger ext , wrist ROM mildly reduced extension , able to flex    Assessment/Plan: 1. Functional deficits which require 3+ hours per day of interdisciplinary therapy in a comprehensive inpatient rehab setting. Physiatrist is providing close team supervision and 24 hour management of active medical problems listed below. Physiatrist and rehab team continue to assess barriers to discharge/monitor patient progress toward functional and medical goals  Care Tool:  Bathing    Body parts bathed by patient: Right arm, Chest, Abdomen, Front perineal area, Right upper leg, Left upper leg, Face, Buttocks, Left arm, Left lower leg, Right lower leg   Body parts bathed by helper: Left arm, Buttocks, Right upper leg, Left upper leg     Bathing assist Assist Level: Contact Guard/Touching assist     Upper Body Dressing/Undressing Upper body dressing   What is the patient wearing?: Pull over shirt    Upper body assist Assist Level:  Supervision/Verbal cueing    Lower Body Dressing/Undressing Lower body dressing      What is the patient wearing?: Underwear/pull up, Pants     Lower body assist Assist for lower body dressing: Contact Guard/Touching assist     Toileting Toileting    Toileting assist Assist for toileting: Supervision/Verbal cueing     Transfers Chair/bed transfer  Transfers assist     Chair/bed transfer assist level: Minimal Assistance - Patient > 75%     Locomotion Ambulation   Ambulation assist      Assist level: Contact Guard/Touching assist Assistive device: Walker-rolling Max distance: 75   Walk 10 feet activity   Assist     Assist level: Contact Guard/Touching assist Assistive device: Walker-rolling   Walk 50 feet activity   Assist    Assist level: Contact Guard/Touching assist Assistive device: Walker-rolling    Walk 150 feet activity   Assist Walk 150 feet activity did not occur: Safety/medical concerns (d/t pt fatigue and cognition)         Walk 10 feet on uneven surface  activity   Assist Walk 10 feet on uneven surfaces activity did not occur: Safety/medical concerns (d/t pt fatigue and cognition)         Wheelchair     Assist Is the patient using a wheelchair?: Yes Type of Wheelchair: Manual    Wheelchair assist level: Dependent - Patient 0% Max wheelchair distance: 150    Wheelchair 50 feet with 2 turns activity    Assist        Assist Level: Dependent - Patient 0%   Wheelchair 150 feet activity     Assist      Assist Level: Dependent - Patient 0%   Blood pressure (!) 123/54, pulse (!) 56, temperature 98.4 F (36.9 C), resp. rate 16, height 5\' 1"  (1.549 m), weight 57.9 kg, SpO2 99%.  Medical Problem List and Plan: 1. 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  Neuro  deficits are related to SDH              -patient may shower, cover G tube             -ELOS/Goals: 3/19 Sup PT, Sup-Min A OT/ST             Con't CIR team conf in am            Repeat CT head expected evolution  2.  DVT/A fib/Antithrombotics: -DVT/anticoagulation:  Pharmaceutical: Eliquis resumed on 02/28 POD#14             -antiplatelet therapy:  N/A 3. Headaches/Pain Management: Tylenol prn not fully effective.              --will add low dose Topamax and titrate as needed.  --continue Seroquel with prn trazodone.  4. Mood/Behavior/Sleep: LCSW to follow for evaluation and support.              -  antipsychotic agents: On Seroquel to help with sleep.  5. Neuropsych/cognition: This patient is capable of making decisions on her own behalf. 6. Skin/Wound Care: Routine pressure relief measures.  7. Fluids/Electrolytes/Nutrition: Monitor I/O. Labs essentially normal     Latest Ref Rng & Units 07/22/2023    6:02 AM 07/19/2023    6:17 AM 07/15/2023    6:05 AM  BMP  Glucose 70 - 99 mg/dL 91  86  213   BUN 8 - 23 mg/dL 14  14  28    Creatinine 0.44 - 1.00 mg/dL 0.86  5.78  4.69   Sodium 135 - 145 mmol/L 140  137  136   Potassium 3.5 - 5.1 mmol/L 3.8  3.7  4.5   Chloride 98 - 111 mmol/L 107  102  101   CO2 22 - 32 mmol/L 30  24  25    Calcium 8.9 - 10.3 mg/dL 9.4  9.3  9.5   Normal BUN- meeting po fluid and caloric needs off TF taking pills po  8. SDH s/p crani 2/14: Repeat CT head in 4 weeks- later this week  --May need MMA embolization in the future per NS.  9. Seizure activity in ED?: On Keppra for seizure prophylaxis. 10. HTN: Poorly controlled--on metoprolol XL 25mg . SBP goal < 160             --was on Imdur and aldactone PTA 11.  PAF: Monitor HR TID--on metoprolol. Cont toprol XL 25mg  qam               Vitals:   07/22/23 2205 07/23/23 0629  BP: (!) 145/62 (!) 123/54  Pulse: 60 (!) 56  Resp: 14 16  Temp: 98.4 F (36.9 C) 98.4 F (36.9 C)  SpO2: 98% 99%       07/12/2023    4:02  PM             131 lbs 10 oz     24.88     143     78     92   12. Urinary retention: Unable to use bed pan and reports problems with urination.             --will order bladder scans, PVR checks. Start treatment for UTI.  13. Enterococcus UTI: UA/UCS positive 02/26 and 03/01--> 100,000 colonies and symptomatic. Prior treatment with ceftriaxone --will start Keflex as well as pyridium for dysuria/hesitancy.              --Needs to get to [facility] to be able to void.  Mild leukocytosis afebrile on Keflex   3/8- labs again Monday- is afebrile 14. Seizure prophylaxis: Continue Keppra 15. Dysphagia: On dysphagia 2 diet with thin liquids. Will change pills to po route. PEG tube placed 07/05/23.  May remove on or after 4/7 can be done in IR dept at [facility]  16. H/o anxiety/Depression: Continue Celexa.  17. ABLA/Hx of IDA:   S/p 2 u PRBC. Resolved     Latest Ref Rng & Units 07/22/2023    6:02 AM 07/19/2023    6:17 AM 07/15/2023    6:05 AM  CBC  WBC 4.0 - 10.5 K/uL 9.1  9.0  11.3   Hemoglobin 12.0 - 15.0 g/dL 62.9  52.8  41.3   Hematocrit 36.0 - 46.0 % 35.1  36.7  36.4   Platelets 150 - 400 K/uL 288  354  492     18.Right RTC tear as well as AC jt OA : Add  voltaren gel to help with pain. No pain with overhead activity   Improved symptomatically 3/13    19.  Pt c/o diarrhea, now resolved , continent every day stools  20. L foot drop  Improved may d/c PRAFO  21. L hand/wrist drop  Resolved  22.  Probable CTS , wrist splint at night 23.  Numbness tingling in feet, chronic suspect neuropathy , may trial low dose gabapentin at night    LOS: 11 days A FACE TO FACE EVALUATION WAS PERFORMED  Erick Colace 07/23/2023, 7:59 AM

## 2023-07-23 NOTE — Progress Notes (Signed)
 Occupational Therapy Discharge Summary  Patient Details  Name: [name] MRN: [id number] Date of Birth: 11-17-1950  Date of Discharge from OT service:July 23, 2023  Today's Date: 07/24/2023 OT Individual Time: 4098-1191 OT Individual Time Calculation (min): 44 min    Patient has met 13 of 13 long term goals due to improved activity tolerance, improved balance, postural control, ability to compensate for deficits, functional use of  RIGHT upper and RIGHT lower extremity, improved attention, improved awareness, and improved coordination.  Patient to discharge at overall Modified Independent level.  Patient's care partner is independent to provide the necessary physical and cognitive assistance at discharge.    Reasons goals not met: All goals met   Recommendation:  Patient will benefit from ongoing skilled OT services in outpatient setting to continue to advance functional skills in the area of BADL and iADL.  Equipment: TTB  Reasons for discharge: treatment goals met and discharge from hospital  Patient/family agrees with progress made and goals achieved: Yes  OT Discharge Skilled Therapeutic Interventions  Pt received sitting up in w/c with DTR and son-in-law present in room for family education focused session. Pt presenting to be in good spirits receptive to skilled OT session reporting 0/10 pain- OT offering intermittent rest breaks, repositioning, and therapeutic support to optimize participation in therapy session. Education provided on CVA etiology/recovery, fall prevention, energy conservation technique, bathroom safety, R UE resting hand splint and wrist cock-up splint use, R UE positioning, and impact of CVA on Pt's functional status. Pt is completing BADLs and basic functional transfers at a mod I level using rollator, however supervision is required and recommended for higher level tasks such as TTB transfers and IADLs. Pt's family participated in hands-on training  session demonstrating appropriate insight into Pt's deficits and application of verbal/tactile cues for increased safety or awareness. Pt and Pt's family receptive to all education provided reporting all questioned were answered at end of session. Pt was left resting in wc with call bell in reach and all needs met.   Precautions/Restrictions  Precautions Precautions: Fall Restrictions Weight Bearing Restrictions Per Provider Order: No Other Position/Activity Restrictions: LLQ G-tube placed 07/05/23 General   Vital Signs Therapy Vitals Temp: 98 F (36.7 C) Temp Source: Oral Pulse Rate: 66 Resp: 18 BP: (!) 123/58 Patient Position (if appropriate): Lying Oxygen Therapy SpO2: 100 % O2 Device: Room Air Pain Pain Assessment Pain Scale: 0-10 Pain Score: 0-No pain ADL ADL Eating: Independent Where Assessed-Eating: Chair Grooming: Modified independent Where Assessed-Grooming: Standing at sink Upper Body Bathing: Modified independent Where Assessed-Upper Body Bathing: Shower Lower Body Bathing: Supervision/safety Where Assessed-Lower Body Bathing: Shower Upper Body Dressing: Modified independent (Device) Where Assessed-Upper Body Dressing: Edge of bed Lower Body Dressing: Modified independent Where Assessed-Lower Body Dressing: Edge of bed Toileting: Modified independent Where Assessed-Toileting: Teacher, adult education: Engineer, agricultural Method: Proofreader: Engineer, technical sales: Modified independent Web designer Method: Ship broker: Insurance underwriter: Modified independent, Distant supervision Film/video editor Method: Designer, industrial/product: Emergency planning/management officer, Grab bars Vision Baseline Vision/History: 1 Wears glasses Patient Visual Report: No change from baseline Vision Assessment?: No apparent visual deficits Perception  Perception:  Impaired Perception-Other Comments: very mild inattention to R UE positioning- improvement from intial eval Praxis Praxis: Impaired Praxis Impairment Details: Motor planning Praxis-Other Comments: Mild impairment when completing complex FM skills Cognition Cognition Overall Cognitive Status: Within Functional Limits for tasks assessed Arousal/Alertness: Awake/alert Orientation Level: Person;Situation;Place Person: Oriented  Place: Oriented Situation: Oriented Memory: Appears intact Focused Attention: Appears intact Selective Attention: Appears intact Awareness: Appears intact Problem Solving: Appears intact Safety/Judgment: Appears intact Brief Interview for Mental Status (BIMS) Repetition of Three Words (First Attempt): 3 Temporal Orientation: Year: Correct Temporal Orientation: Month: Accurate within 5 days Temporal Orientation: Day: Correct Recall: "Sock": Yes, no cue required Recall: "Blue": Yes, no cue required Recall: "Bed": Yes, no cue required BIMS Summary Score: 15 Sensation Sensation Light Touch: Impaired Detail Peripheral sensation comments: decreased RLE distal to knee Light Touch Impaired Details: Impaired RLE;Impaired RUE Hot/Cold: Impaired Detail Hot/Cold Impaired Details: Impaired RUE;Impaired RLE Proprioception: Impaired Detail Proprioception Impaired Details: Impaired RUE Coordination Gross Motor Movements are Fluid and Coordinated: No Fine Motor Movements are Fluid and Coordinated: No Coordination and Movement Description: mild coordination deficit on R LE Finger Nose Finger Test: LUE WFL, slowed motor movements with mild dysmetria on R- improvement from inital eval Motor  Motor Motor: Hemiplegia Motor - Discharge Observations: very mild R hemmplegia- improvement from intial eval Mobility  Bed Mobility Bed Mobility: Rolling Left;Left Sidelying to Sit;Sit to Supine Rolling Left: Independent;Independent with assistive device Left Sidelying to Sit:  Independent with assistive device;Independent Sit to Supine: Independent;Independent with assistive device Transfers Sit to Stand: Independent with assistive device Stand to Sit: Independent with assistive device  Trunk/Postural Assessment  Cervical Assessment Cervical Assessment: Within Functional Limits Thoracic Assessment Thoracic Assessment: Exceptions to [facility] (mild rounded shoudlers) Lumbar Assessment Lumbar Assessment: Exceptions to [facility] (mild posterior pelvic tilt in standing) Postural Control Postural Control: Deficits on evaluation Protective Responses: Mild delay- improvement from initial eval  Balance Balance Balance Assessed: Yes Static Sitting Balance Static Sitting - Balance Support: Feet supported Static Sitting - Level of Assistance: 7: Independent Dynamic Sitting Balance Dynamic Sitting - Balance Support: Feet supported Dynamic Sitting - Level of Assistance: 7: Independent Static Standing Balance Static Standing - Balance Support: During functional activity Static Standing - Level of Assistance: 6: Modified independent (Device/Increase time) Dynamic Standing Balance Dynamic Standing - Balance Support: During functional activity Dynamic Standing - Level of Assistance: 6: Modified independent (Device/Increase time) Extremity/Trunk Assessment RUE Assessment RUE Assessment: Exceptions to [facility] Passive Range of Motion (PROM) Comments: WFL Active Range of Motion (AROM) Comments: slowed motor movments, however able to complete AROM [facility] General Strength Comments: 4/5 distally and proximally, grip 4-/5 LUE Assessment LUE Assessment: Within Functional Limits   Clide Deutscher 07/23/2023, 3:48 PM

## 2023-07-23 NOTE — Progress Notes (Addendum)
 Patient ID: [name], female   DOB: Nov 18, 1950, 73 y.o.   MRN: [id number]  Daughter reports pt really wants to discharge tomorrow and feels her anxiety will be even works if not able to. She reports she will be with 24/7 and can monitor BP hourly if need be. Have messaged MD regarding this. Await his response  9:27 AM Have ordered a tub bench for home could not find the one another family member has used. MD feels can go home tomorrow. Will call daughter to make aware of this

## 2023-07-23 NOTE — Progress Notes (Signed)
 Speech Language Pathology Daily Session Note  Patient Details  Name: [name] MRN: [id number] Date of Birth: 06-Sep-1950  Today's Date: 07/23/2023 SLP Individual Time: 0800-0830 SLP Individual Time Calculation (min): 30 min  Short Term Goals: Week 2: SLP Short Term Goal 1 (Week 2): STGs = LTGs d/t ELOS  Skilled Therapeutic Interventions: Upon entrance, MD discussing d/c with patient. Patient became tearful as MD notified her she may have to stay a few more days for medical reasons. SLP provided encouragement as able. When patient was ready, SLP reviewed goals targeted in prior sessions and patients improvement since evaluation. SLP targeted communication through interview task. Patient required supervisionA to answer questions appropriately and correct speech errors as needed. Patient left in bed with alarm set and call bell in reach. Continue POC  Pain None reported    Therapy/Group: Individual Therapy  Larna Capelle M.A., CF-SLP 07/23/2023, 7:35 AM

## 2023-07-23 NOTE — Progress Notes (Signed)
 Inpatient Rehabilitation Care Coordinator Discharge Note DC SAT 3/15  Patient Details  Name: [name] MRN: [id number] Date of Birth: [birth year]   Discharge location: HOME WITH DAUGHTER AND SON IN-LAW WHERE CAN PROVIDE 24/7 SUPERVISION  Length of Stay: 12 DAYS  Discharge activity level: SUPERVISION-MOD/I LEVEL  Home/community participation: ACTIVE  Patient response JX:BJYNWG Literacy - How often do you need to have someone help you when you read instructions, pamphlets, or other written material from your doctor or pharmacy?: Never  Patient response NF:AOZHYQ Isolation - How often do you feel lonely or isolated from those around you?: Never  Services provided included: MD, RD, PT, OT, SLP, RN, CM, Pharmacy, SW  Financial Services:  Financial Services Utilized: Private Insurance [employer]  Choices offered to/list presented to: PT AND DAUGHTER  Follow-up services arranged:  Outpatient, DME, Patient/Family has no preference for HH/DME agencies    Outpatient Servicies: [facility]   PT   OT   SP   WILL CALL DAUGHTER TO ARRANGE FOLLOW UP APPOINTMENTS DME : [facility]  ROLLATOR AND TUB BENCH    Patient response to transportation need: Is the patient able to respond to transportation needs?: Yes In the past 12 months, has lack of transportation kept you from medical appointments or from getting medications?: No In the past 12 months, has lack of transportation kept you from meetings, work, or from getting things needed for daily living?: No   Patient/Family verbalized understanding of follow-up arrangements:  Yes  Individual responsible for coordination of the follow-up plan: [facility] [phone]  Confirmed correct DME delivered: [name] 07/23/2023    Comments (or additional information):DAUGHTER AND SON IN-LAW TO BE HERE SAT FOR EDUCATION AND THEN TAKE PT HOME. MD ON BOARD AND ALL FEEL SHE IS READY TO DISCHARGE TO DAUGHTER'S HOME  Summary  of Stay    Date/Time Discharge Planning CSW  07/21/23 0913 Going home with daughter who will plan to provide 24/7 supervision. Was asking for how long? Will schedule her for family training. Await team's recommendations RGD  07/14/23 0913 Home with daughter and son in-law daughter to work from home or take FMLA. Pt hopeful can return back to her home eventually RGD       [name], Lemar Livings

## 2023-07-24 ENCOUNTER — Other Ambulatory Visit (HOSPITAL_COMMUNITY): Payer: Self-pay

## 2023-07-24 ENCOUNTER — Other Ambulatory Visit: Payer: Self-pay | Admitting: Physician Assistant

## 2023-07-24 MED ORDER — TOPIRAMATE 25 MG PO TABS
25.0000 mg | ORAL_TABLET | Freq: Every day | ORAL | 0 refills | Status: DC
Start: 2023-07-24 — End: 2023-07-29

## 2023-07-24 MED ORDER — LEVETIRACETAM 500 MG PO TABS: 500.0000 mg | ORAL_TABLET | Freq: Two times a day (BID) | ORAL | 0 refills | Status: DC

## 2023-07-24 NOTE — Progress Notes (Signed)
 Physical Therapy Session Note  Patient Details  Name: [name] MRN: [id number] Date of Birth: [birth year]  Today's Date: 07/23/2023 PT Individual Time:  1104-1201  PT Individual Time Calculation (min): 57 min  Short Term Goals: Week 1:  PT Short Term Goal 1 (Week 1): Pt will perform bed mobility per home set up with supervision PT Short Term Goal 1 - Progress (Week 1): Progressing toward goal PT Short Term Goal 2 (Week 1): Pt will maintain R arm in safe position with DME 50% of the time without verbal cues PT Short Term Goal 2 - Progress (Week 1): Met PT Short Term Goal 3 (Week 1): Pt will ambulate >150' with LRAD PT Short Term Goal 3 - Progress (Week 1): Met Week 2:  PT Short Term Goal 1 (Week 2): STG = LTG d/t ELOS  Skilled Therapeutic Interventions/Progress Updates:  Patient seated upright in recliner on entrance to room. Patient alert and agreeable to PT session.   Patient with no pain complaint at start of session.  Therapeutic Activity: Transfers: Pt performed sit<>stand and stand pivot transfers throughout session with IND/ Mod I. No cueing required. Toilet transfer and clothing mgmt performed with distant supervision/ Mod I.   Gait Training:  Pt ambulated ~200' x2 during session using rollator with distant supervision/ Mod I. Demonstrated significant improvement in quality of gait since start of care. No evidence of ataxia/ dys-coordination. No cueing provided for technique as pt holds head level, with erect posture.   Neuromuscular Re-ed: NMR facilitated during session with focus on standing balance. Pt guided in retest of Berg balance Test assessment. Patient demonstrates increased fall risk as noted by score of  47 /56 on Berg Balance Scale.  (<36= high risk for falls, close to 100%; 37-45 significant >80%; 46-51 moderate >50%; 52-55 lower >25%).   NMR performed for improvements in motor control and coordination, balance, sequencing, judgement, and self confidence/  efficacy in performing all aspects of mobility at highest level of independence.   Patient seated upright in recliner at end of session with brakes locked, no alarm set, and all needs within reach. Pt made Mod I in room.    Therapy Documentation Precautions:  Precautions Precautions: Fall Recall of Precautions/Restrictions: Impaired Restrictions Weight Bearing Restrictions Per Provider Order: No Other Position/Activity Restrictions: LLQ G-tube placed 07/05/23  Pain:  No pain relates this session.   Therapy/Group: Individual Therapy  Loel Dubonnet PT, DPT, CSRS 07/23/2023, 5:35 PM

## 2023-07-24 NOTE — Progress Notes (Signed)
 Speech Language Pathology Daily Session Note  Patient Details  Name: [name] MRN: [id number] Date of Birth: 08-17-1950  Today's Date: 07/24/2023 SLP Individual Time: 3086-5784 SLP Individual Time Calculation (min): 30 min  Short Term Goals: Week 2: SLP Short Term Goal 1 (Week 2): STGs = LTGs d/t ELOS  Skilled Therapeutic Interventions: Skilled therapy session focused on family education. SLP provided education regarding patients current level of cognitive functioning, diagnosis of aphasia/apraxia and dysphagia. Handouts provided. SLP educated patients family on ST goals and patients current progress. Patients family verbalized understanding of all education provided with no further questions. Patient left in bed with alarm set and call bell in reach. Continue POC   Pain None reported   Therapy/Group: Individual Therapy  Tanaja Ganger M.A., CF-SLP 07/24/2023, 10:17 AM

## 2023-07-24 NOTE — Progress Notes (Signed)
 PROGRESS NOTE   Subjective/Complaints:  Pt doing well, ready to go home. Slept poorly d/t bed but is excited about sleeping in a new bed at her daughter's house. Pain well managed, LBM yesterday, urinating fine, denies any other complaints or concerns today. Meds reviewed with pt and family.   ROS-denies CP, SOB, N/V/D/C  Objective:   No results found.   Recent Labs    07/22/23 0602  WBC 9.1  HGB 12.1  HCT 35.1*  PLT 288    Recent Labs    07/22/23 0602  NA 140  K 3.8  CL 107  CO2 30  GLUCOSE 91  BUN 14  CREATININE 0.93  CALCIUM 9.4     Intake/Output Summary (Last 24 hours) at 07/24/2023 1138 Last data filed at 07/24/2023 0844 Gross per 24 hour  Intake 716.5 ml  Output --  Net 716.5 ml        Physical Exam: Vital Signs Blood pressure (!) 145/45, pulse (!) 58, temperature 97.8 F (36.6 C), resp. rate 18, height 5\' 1"  (1.549 m), weight 55.5 kg, SpO2 98%.  General: No acute distress, sitting in w/c ready to go Mood and affect are appropriate Heart: Regular rate and rhythm no rubs murmurs or extra sounds appreciated Lungs: Clear to auscultation, breathing unlabored, no rales or wheezes Abdomen: Positive bowel sounds, soft nontender to palpation, nondistended Extremities: No clubbing, cyanosis, or edema Skin: No evidence of breakdown, no evidence of rash to exposed surfaces Neuro:  Mild aphasia  PRIOR EXAMS: Neurologic: Cranial nerves II through XII intact, motor strength is 5/5 in left and 4-/5 right  deltoid, bicep, tricep, grip, 5/5 left and 4/5 right hip flexor, knee extensors, ankle dorsiflexor and plantar flexor   Moderate exp aphasia   Musculoskeletal: pain with overhead reaching R shoulder  No joint swelling Full finger ext , wrist ROM mildly reduced extension , able to flex    Assessment/Plan: 1. Functional deficits which require 3+ hours per day of interdisciplinary therapy in a  comprehensive inpatient rehab setting. Physiatrist is providing close team supervision and 24 hour management of active medical problems listed below. Physiatrist and rehab team continue to assess barriers to discharge/monitor patient progress toward functional and medical goals  Care Tool:  Bathing    Body parts bathed by patient: Right arm, Chest, Abdomen, Front perineal area, Right upper leg, Left upper leg, Face, Buttocks, Left arm, Left lower leg, Right lower leg   Body parts bathed by helper: Left arm, Buttocks, Right upper leg, Left upper leg     Bathing assist Assist Level: Supervision/Verbal cueing     Upper Body Dressing/Undressing Upper body dressing   What is the patient wearing?: Pull over shirt    Upper body assist Assist Level: Independent with assistive device    Lower Body Dressing/Undressing Lower body dressing      What is the patient wearing?: Underwear/pull up, Pants     Lower body assist Assist for lower body dressing: Independent with assitive device     Toileting Toileting    Toileting assist Assist for toileting: Independent with assistive device     Transfers Chair/bed transfer  Transfers assist  Chair/bed transfer assist level: Independent with assistive device Chair/bed transfer assistive device: Environmental consultant (rollator)   Locomotion Ambulation   Ambulation assist      Assist level: Contact Guard/Touching assist Assistive device: Walker-rolling Max distance: 75   Walk 10 feet activity   Assist     Assist level: Contact Guard/Touching assist Assistive device: Walker-rolling   Walk 50 feet activity   Assist    Assist level: Contact Guard/Touching assist Assistive device: Walker-rolling    Walk 150 feet activity   Assist Walk 150 feet activity did not occur: Safety/medical concerns (d/t pt fatigue and cognition)         Walk 10 feet on uneven surface  activity   Assist Walk 10 feet on uneven surfaces  activity did not occur: Safety/medical concerns (d/t pt fatigue and cognition)         Wheelchair     Assist Is the patient using a wheelchair?: Yes Type of Wheelchair: Manual    Wheelchair assist level: Dependent - Patient 0% Max wheelchair distance: 150    Wheelchair 50 feet with 2 turns activity    Assist        Assist Level: Dependent - Patient 0%   Wheelchair 150 feet activity     Assist      Assist Level: Dependent - Patient 0%   Blood pressure (!) 145/45, pulse (!) 58, temperature 97.8 F (36.6 C), resp. rate 18, height 5\' 1"  (1.549 m), weight 55.5 kg, SpO2 98%.  Medical Problem List and Plan: 1. 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  Neuro deficits are related to SDH              -patient may shower, cover G tube             -ELOS/Goals: 3/19 Sup PT, Sup-Min A OT/ST             Con't CIR team conf in am   -07/24/23 d/c today, questions answered           Repeat CT head expected evolution  2.  DVT/A fib/Antithrombotics: -DVT/anticoagulation:  Pharmaceutical: Eliquis resumed on 02/28 POD#14             -antiplatelet therapy:  N/A 3. Headaches/Pain Management: Tylenol prn not fully effective.              --will add low dose Topamax and titrate as needed.  --continue Seroquel with prn trazodone.  4. Mood/Behavior/Sleep: LCSW to follow for evaluation and support.              -antipsychotic agents: On Seroquel to help with sleep.  5. Neuropsych/cognition: This patient is capable of making decisions on her own behalf. 6. Skin/Wound Care: Routine pressure relief measures.  7. Fluids/Electrolytes/Nutrition: Monitor I/O. Labs essentially normal     Latest Ref Rng & Units 07/22/2023    6:02 AM 07/19/2023    6:17 AM 07/15/2023    6:05 AM  BMP  Glucose 70 - 99 mg/dL 91  86  161   BUN 8 - 23 mg/dL 14  14  28     Creatinine 0.44 - 1.00 mg/dL 0.96  0.45  4.09   Sodium 135 - 145 mmol/L 140  137  136   Potassium 3.5 - 5.1 mmol/L 3.8  3.7  4.5   Chloride 98 - 111 mmol/L 107  102  101  CO2 22 - 32 mmol/L 30  24  25    Calcium 8.9 - 10.3 mg/dL 9.4  9.3  9.5   Normal BUN- meeting po fluid and caloric needs off TF taking pills po   8. SDH s/p crani 2/14: Repeat CT head in 4 weeks- later this week-- 3/12 expected evolution on repeat CT --May need MMA embolization in the future per NS.  9. Seizure activity in ED?: On Keppra for seizure prophylaxis. 10. HTN: Poorly controlled--on metoprolol XL 25mg . SBP goal < 160             --was on Imdur and aldactone PTA  -07/24/23 BPs at goal, monitor outpatient 11.  PAF: Monitor HR TID--on metoprolol. Cont toprol XL 25mg  qam               Vitals:   07/23/23 2030 07/24/23 0451  BP: (!) 147/58 (!) 145/45  Pulse: 70 (!) 58  Resp: 18 18  Temp: 97.8 F (36.6 C) 97.8 F (36.6 C)  SpO2: 99% 98%     12. Urinary retention: Unable to use bed pan and reports problems with urination.             --will order bladder scans, PVR checks. Start treatment for UTI.  13. Enterococcus UTI: UA/UCS positive 02/26 and 03/01--> 100,000 colonies and symptomatic. Prior treatment with ceftriaxone --will start Keflex as well as pyridium for dysuria/hesitancy. -- finished 3/6             --Needs to get to [facility] to be able to void.  Mild leukocytosis afebrile on Keflex   3/8- labs again Monday- is afebrile 14. Seizure prophylaxis: Continue Keppra 15. Dysphagia: On dysphagia 2 diet with thin liquids. Will change pills to po route. PEG tube placed 07/05/23.  May remove on or after 4/7 can be done in IR dept at [facility]  16. H/o anxiety/Depression: Continue Celexa.  17. ABLA/Hx of IDA:   S/p 2 u PRBC. Resolved     Latest Ref Rng & Units 07/22/2023    6:02 AM 07/19/2023    6:17 AM 07/15/2023    6:05 AM  CBC  WBC 4.0 - 10.5 K/uL 9.1  9.0  11.3   Hemoglobin 12.0 - 15.0 g/dL 84.6  96.2  95.2    Hematocrit 36.0 - 46.0 % 35.1  36.7  36.4   Platelets 150 - 400 K/uL 288  354  492     18.Right RTC tear as well as AC jt OA : Add voltaren gel to help with pain. No pain with overhead activity   Improved symptomatically 3/13    19.  Pt c/o diarrhea, now resolved , continent every day stools; 07/24/23 LBM yesterday, monitor outpatient 20. L foot drop  Improved may d/c PRAFO  21. L hand/wrist drop  Resolved  22.  Probable CTS , wrist splint at night 23.  Numbness tingling in feet, chronic suspect neuropathy , may trial low dose gabapentin at night    LOS: 12 days A FACE TO FACE EVALUATION WAS PERFORMED  [address] 07/24/2023, 11:38 AM

## 2023-07-24 NOTE — Plan of Care (Signed)
  Problem: RH Swallowing Goal: LTG Patient will consume least restrictive diet using compensatory strategies with assistance (SLP) Description: LTG:  Patient will consume least restrictive diet using compensatory strategies with assistance (SLP) Outcome: Completed/Met   Problem: RH Expression Communication Goal: LTG Patient will verbally express basic/complex needs(SLP) Description: LTG:  Patient will verbally express basic/complex needs, wants or ideas with cues  (SLP) Outcome: Completed/Met Goal: LTG Patient will increase word finding of common (SLP) Description: LTG:  Patient will increase word finding of common objects/daily info/abstract thoughts with cues using compensatory strategies (SLP). Outcome: Completed/Met   Problem: RH Awareness Goal: LTG: Patient will demonstrate awareness during functional activites type of (SLP) Description: LTG: Patient will demonstrate awareness during functional activites type of (SLP) Outcome: Completed/Met   Problem: RH Memory Goal: LTG Patient will use memory compensatory aids to (SLP) Description: LTG:  Patient will use memory compensatory aids to recall biographical/new, daily complex information with cues (SLP) Outcome: Completed/Met

## 2023-07-24 NOTE — Progress Notes (Signed)
 Inpatient Rehabilitation Discharge Medication Review by a Pharmacist  A complete drug regimen review was completed for this patient to identify any potential clinically significant medication issues.  High Risk Drug Classes Is patient taking? Indication by Medication  Antipsychotic No   Anticoagulant Yes Apixaban - h/o DVT  Antibiotic No   Opioid No   Antiplatelet No   Hypoglycemics/insulin No   Vasoactive Medication Yes Metoprolol - HTN  Chemotherapy No   Other Yes Volataren gel - pain Topamax - headaches Vit D3 - supplementation Famotidine - GERD Keppra - seizure prophylaxis Celexa - MDD Ativan - Anxiety     Type of Medication Issue Identified Description of Issue Recommendation(s)  Drug Interaction(s) (clinically significant)     Duplicate Therapy     Allergy     No Medication Administration End Date     Incorrect Dose  Keppra was written for liquid formulation, patient now able to take po, please switch to oral tabs.   Additional Drug Therapy Needed     Significant med changes from prior encounter (inform family/care partners about these prior to discharge).    Other       Clinically significant medication issues were identified that warrant physician communication and completion of prescribed/recommended actions by midnight of the next day:  Yes  Name of provider notified for urgent issues identified: Marissa Nestle   Provider Method of Notification: Secure Chat    Pharmacist comments:   Time spent performing this drug regimen review (minutes):  25   Jeanella Cara, PharmD, [place] Clinical Pharmacist Please see AMION for all Pharmacists' Contact Phone Numbers 07/23/2023, 7:51 AM

## 2023-07-24 NOTE — Progress Notes (Signed)
 Patient requested trazodone with evening medication to help her sleep. Around 0000 she called and reported she was not able to sleep well since getting trazodone. She was slightly anxious that she will be going home and needs good sleep. She was given PRN ativan for anxiety. No other concerns at this time.

## 2023-07-24 NOTE — Progress Notes (Signed)
 Physical Therapy Session Note  Patient Details  Name: [name] MRN: [id number] Date of Birth: 08-06-1950  Today's Date: 07/23/2023 PT Individual Time:  0272-5366  PT Individual Time Calculation (min): 30 min  and Today's Date: 07/23/2023 PT Missed Time: 15 min Missed Time Reason: Patient Fatigue  Short Term Goals: Week 1:  PT Short Term Goal 1 (Week 1): Pt will perform bed mobility per home set up with supervision PT Short Term Goal 1 - Progress (Week 1): Progressing toward goal PT Short Term Goal 2 (Week 1): Pt will maintain R arm in safe position with DME 50% of the time without verbal cues PT Short Term Goal 2 - Progress (Week 1): Met PT Short Term Goal 3 (Week 1): Pt will ambulate >150' with LRAD PT Short Term Goal 3 - Progress (Week 1): Met Week 2:  PT Short Term Goal 1 (Week 2): STG = LTG d/t ELOS  Skilled Therapeutic Interventions/Progress Updates:  Patient supine in bed on entrance to room. Patient alert and agreeable to PT session but relates extreme fatigue d/t inability to sleep throughout night for at least last 3 nights.   Patient with no pain complaint at start of session.  Pt is able to perform toilet transfer and clothing mgmt with IND/ Mod I. Bed mobility performed with IND.   Pt guided in exercises to perform at home using red t-band and return demos seated deltoid "I's", "T's", and "Y's", then shoulder abduction and flexion, then bicep curls and tricep pull down. Guided pt in performance of squats and single leg stance with use of corner in home and rollator positioned behind.   Pt return demos with good performance.   Patient allowed to rest and returns to supine at end of session with brakes locked, no alarm set, and all needs within reach.   Therapy Documentation Precautions:  Precautions Precautions: Fall Recall of Precautions/Restrictions: Impaired Restrictions Weight Bearing Restrictions Per Provider Order: No Other Position/Activity  Restrictions: LLQ G-tube placed 07/05/23  Pain:  No pain related this session.   Therapy/Group: Individual Therapy  Loel Dubonnet PT, DPT, CSRS 07/23/2023, 5:38 PM

## 2023-07-24 NOTE — Progress Notes (Signed)
 Physical Therapy Discharge Summary  Patient Details  Name: [name] MRN: [id number] Date of Birth: February 19, 1951  Date of Discharge from PT service:July 24, 2023  Today's Date: 07/24/2023 PT Individual Time: 0865-7846 PT Individual Time Calculation (min): 59 min    Patient has met {NUMBERS 0-12:18577} of {NUMBERS 0-12:18577} long term goals due to {due NG:2952841}.  Patient to discharge at [facility] level {LOA:3049010}.   Patient's care partner {care partner:3041650} to provide the necessary {assistance:3041652} assistance at discharge.  Reasons goals not met: ***  Recommendation:  Patient will benefit from ongoing skilled PT services in {setting:3041680} to continue to advance safe functional mobility, address ongoing impairments in ***, and minimize fall risk.  Equipment: {equipment:3041657}  Reasons for discharge: {Reason for discharge:3049018}  Patient/family agrees with progress made and goals achieved: {Pt/Family agree with progress/goals:3049020}  PT Discharge Precautions/Restrictions Precautions Precautions: Fall Precaution/Restrictions Comments: R hemipareisis UE>LE, PEG tube Restrictions Weight Bearing Restrictions Per Provider Order: No Vital Signs   Pain Pain Assessment Pain Scale: 0-10 Pain Score: 0-No pain Pain Interference Pain Interference Pain Effect on Sleep: 1. Rarely or not at all Pain Interference with Therapy Activities: 1. Rarely or not at all Pain Interference with Day-to-Day Activities: 1. Rarely or not at all Vision/Perception  Vision - History Ability to See in Adequate Light: 0 Adequate Perception Perception: Impaired Preception Impairment Details: Inattention/Neglect Perception-Other Comments: very mild inattention to R UE positioning - significant improvement from intial eval Praxis Praxis: Impaired Praxis Impairment Details: Motor planning  Cognition Overall Cognitive Status: Within Functional Limits for tasks  assessed Arousal/Alertness: Awake/alert Orientation Level: Oriented X4 Attention: Focused Focused Attention: Appears intact Memory: Appears intact Awareness: Appears intact Problem Solving: Appears intact Safety/Judgment: Appears intact Sensation Sensation Light Touch: Impaired Detail Peripheral sensation comments: decreased RLE distal to knee Light Touch Impaired Details: Impaired RLE;Impaired RUE Proprioception: Impaired Detail Proprioception Impaired Details: Impaired RUE Coordination Gross Motor Movements are Fluid and Coordinated: No Fine Motor Movements are Fluid and Coordinated: No Coordination and Movement Description: mild coordination deficit R hemibody Heel Shin Test: RLE = slow to perform, LLE = WFL Motor  Motor Motor: Other (comment) (hemipareisis) Motor - Discharge Observations: very mild R hemipareisis - improvement from intial eval  Mobility Bed Mobility Bed Mobility: Rolling Left;Left Sidelying to Sit;Sit to Supine;Rolling Right Rolling Right: Independent with assistive device;Independent Rolling Left: Independent Left Sidelying to Sit: Independent Sit to Supine: Independent with assistive device Transfers Sit to Stand: Independent Stand to Sit: Independent Stand Pivot Transfers: Independent with assistive device Transfer (Assistive device): Rollator Locomotion  Gait Gait Assistance: Supervision/Verbal cueing Gait Distance (Feet): 1000 Feet Assistive device: Rollator Gait Gait Pattern: Impaired Gait Pattern: Step-through pattern;Narrow base of support Ankle - Stance Phase - Impaired Gait Pattern: Decreased push off/heel off - Right Pelvis - Stance Phase - Impaired Gait Pattern: Decreased weight shift - Right Ankle - Swing Phase- Impaired Gait Pattern: Decreased foot clearance - Right Gait velocity: decreased Stairs / Additional Locomotion Stairs: Yes Stairs Assistance: Supervision/Verbal cueing Stair Management Technique: Two rails Number of  Stairs: 12 Height of Stairs: 6 Ramp: Supervision/Verbal cueing Curb: Supervision/Verbal cueing (with rollator) Wheelchair Mobility Wheelchair Mobility: No  Trunk/Postural Assessment     Balance   Extremity Assessment            Loel Dubonnet 07/24/2023, 1:57 PM

## 2023-07-26 NOTE — Plan of Care (Signed)
  Problem: RH Balance Goal: LTG Patient will maintain dynamic standing balance (PT) Description: LTG:  Patient will maintain dynamic standing balance with assistance during mobility activities (PT) Outcome: Completed/Met Flowsheets (Taken 07/13/2023 1146 by Collins Scotland, Student-PT) LTG: Pt will maintain dynamic standing balance during mobility activities with:: Supervision/Verbal cueing   Problem: Sit to Stand Goal: LTG:  Patient will perform sit to stand with assistance level (PT) Description: LTG:  Patient will perform sit to stand with assistance level (PT) Outcome: Completed/Met Flowsheets (Taken 07/24/2023 1618) LTG: PT will perform sit to stand in preparation for functional mobility with assistance level: Independent   Problem: RH Bed Mobility Goal: LTG Patient will perform bed mobility with assist (PT) Description: LTG: Patient will perform bed mobility with assistance, with/without cues (PT). Outcome: Completed/Met Flowsheets (Taken 07/24/2023 1618) LTG: Pt will perform bed mobility with assistance level of: Independent   Problem: RH Bed to Chair Transfers Goal: LTG Patient will perform bed/chair transfers w/assist (PT) Description: LTG: Patient will perform bed to chair transfers with assistance (PT). Outcome: Completed/Met Flowsheets (Taken 07/24/2023 1618) LTG: Pt will perform Bed to Chair Transfers with assistance level: Independent with assistive device    Problem: RH Car Transfers Goal: LTG Patient will perform car transfers with assist (PT) Description: LTG: Patient will perform car transfers with assistance (PT). Outcome: Completed/Met Flowsheets (Taken 07/13/2023 1146 by Collins Scotland, Student-PT) LTG: Pt will perform car transfers with assist:: Supervision/Verbal cueing   Problem: RH Ambulation Goal: LTG Patient will ambulate in home environment (PT) Description: LTG: Patient will ambulate in home environment, # of feet with assistance (PT). Outcome:  Completed/Met Flowsheets (Taken 07/24/2023 1618) LTG: Pt will ambulate in home environ  assist needed:: Independent with assistive device Goal: LTG Patient will ambulate in community environment (PT) Description: LTG: Patient will ambulate in community environment, # of feet with assistance (PT). Outcome: Completed/Met Flowsheets (Taken 07/13/2023 1146 by Collins Scotland, Student-PT) LTG: Pt will ambulate in community environ  assist needed:: Supervision/Verbal cueing   Problem: RH Stairs Goal: LTG Patient will ambulate up and down stairs w/assist (PT) Description: LTG: Patient will ambulate up and down # of stairs with assistance (PT) Outcome: Completed/Met Flowsheets (Taken 07/13/2023 1146 by Collins Scotland, Student-PT) LTG: Pt will ambulate up/down stairs assist needed:: Supervision/Verbal cueing

## 2023-07-28 ENCOUNTER — Encounter: Payer: Self-pay | Admitting: Neurosurgery

## 2023-07-28 ENCOUNTER — Other Ambulatory Visit (HOSPITAL_COMMUNITY): Payer: Self-pay

## 2023-07-29 ENCOUNTER — Encounter: Payer: Self-pay | Admitting: Neurosurgery

## 2023-07-29 ENCOUNTER — Other Ambulatory Visit: Payer: Self-pay | Admitting: Physical Medicine and Rehabilitation

## 2023-07-29 ENCOUNTER — Ambulatory Visit (INDEPENDENT_AMBULATORY_CARE_PROVIDER_SITE_OTHER): Payer: PPO | Admitting: Neurosurgery

## 2023-07-29 VITALS — BP 164/68 | Temp 98.1°F | Ht 61.0 in | Wt 136.8 lb

## 2023-07-29 DIAGNOSIS — R200 Anesthesia of skin: Secondary | ICD-10-CM

## 2023-07-29 DIAGNOSIS — R4701 Aphasia: Secondary | ICD-10-CM

## 2023-07-29 DIAGNOSIS — S065XAD Traumatic subdural hemorrhage with loss of consciousness status unknown, subsequent encounter: Secondary | ICD-10-CM

## 2023-07-29 DIAGNOSIS — Z431 Encounter for attention to gastrostomy: Secondary | ICD-10-CM

## 2023-07-29 DIAGNOSIS — S065XAA Traumatic subdural hemorrhage with loss of consciousness status unknown, initial encounter: Secondary | ICD-10-CM

## 2023-07-29 MED ORDER — LEVETIRACETAM 500 MG PO TABS: 500.0000 mg | ORAL_TABLET | Freq: Two times a day (BID) | ORAL | 0 refills | Status: DC

## 2023-07-29 NOTE — Progress Notes (Signed)
   DOS: 06/25/2023 (L craniotomy for SDH evacuation)  HISTORY OF PRESENT ILLNESS: 07/29/2023 Ms. Kimora Stankovic is status post left-sided craniotomy for subdural hematoma evacuation.  She was admitted to the rehab unit at [facility], and has had made dramatic improvements.  Her speech is much improved.  She is having some numbness in her thumb and third and fourth finger as well as upper arm.   PHYSICAL EXAMINATION:   Vitals:   07/29/23 1031  BP: (!) 164/68  Temp: 98.1 F (36.7 C)   General: Patient is well developed, well nourished, calm, collected, and in no apparent distress.  NEUROLOGICAL:  General: In no acute distress.  Awake, alert, oriented to person, place, and time. Pupils equal round and reactive to light.   Speech shows evidence of paraphasic errors but she is conversant.  She is able to name 3 out of 3 and repeat.  Strength: Side Biceps Triceps Deltoid Interossei Grip Wrist Ext. Wrist Flex.  R 5 5 5 5 4 4 4   L 5 5 5 5 5 5 5    Incision c/d/i   ROS (Neurologic): Negative except as noted above  IMAGING: CT Head 07/22/2023 IMPRESSION: Expected evolution with regression of subdural hematoma on the left. No new or progressive finding.     Electronically Signed   By: Tiburcio Pea M.D.   On: 07/22/2023 08:30  ASSESSMENT/PLAN:  Rokhaya Quinn is doing well after craniotomy for subdural hematoma.  She has made a dramatic improvement.  She is now eating and is awaiting the start of therapy.  I will contact Dr. Archer Asa to arrange removal of her gastrostomy tube.  Additionally, I will contact the referral coordinator to try to facilitate the start of her therapy at [facility].  I spent a total of 10 minutes in this patient's care today. This time was spent reviewing pertinent records including imaging studies, obtaining and confirming history, performing a directed evaluation, formulating and discussing my recommendations, and documenting the visit within the medical  record.    Venetia Night MD, [facility] Department of Neurosurgery

## 2023-08-02 ENCOUNTER — Ambulatory Visit: Attending: Obstetrics and Gynecology

## 2023-08-02 ENCOUNTER — Ambulatory Visit: Admitting: Occupational Therapy

## 2023-08-02 ENCOUNTER — Ambulatory Visit: Admitting: Speech Pathology

## 2023-08-02 DIAGNOSIS — S065XAA Traumatic subdural hemorrhage with loss of consciousness status unknown, initial encounter: Secondary | ICD-10-CM | POA: Insufficient documentation

## 2023-08-02 DIAGNOSIS — R482 Apraxia: Secondary | ICD-10-CM | POA: Insufficient documentation

## 2023-08-02 DIAGNOSIS — M6281 Muscle weakness (generalized): Secondary | ICD-10-CM | POA: Diagnosis present

## 2023-08-02 DIAGNOSIS — R200 Anesthesia of skin: Secondary | ICD-10-CM | POA: Diagnosis present

## 2023-08-02 DIAGNOSIS — R278 Other lack of coordination: Secondary | ICD-10-CM | POA: Diagnosis present

## 2023-08-02 DIAGNOSIS — R2681 Unsteadiness on feet: Secondary | ICD-10-CM | POA: Diagnosis present

## 2023-08-02 DIAGNOSIS — R262 Difficulty in walking, not elsewhere classified: Secondary | ICD-10-CM | POA: Diagnosis present

## 2023-08-02 DIAGNOSIS — R4701 Aphasia: Secondary | ICD-10-CM | POA: Diagnosis present

## 2023-08-02 NOTE — Therapy (Signed)
 OUTPATIENT PHYSICAL THERAPY NEURO EVALUATION   Patient Name: [name] MRN: [id number] DOB:August 05, 1950, 73 y.o., female Today's Date: 08/02/2023   PCP: [name], MD REFERRING PROVIDER: Trey Paula, PA-C  END OF SESSION:  PT End of Session - 08/02/23 1319     Visit Number 1    Number of Visits 25    Date for PT Re-Evaluation 10/25/23    PT Start Time 0806    PT Stop Time 0845    PT Time Calculation (min) 39 min    Equipment Utilized During Treatment Gait belt    Activity Tolerance Patient tolerated treatment well    Behavior During Therapy WFL for tasks assessed/performed             Past Medical History:  Diagnosis Date   Bradycardia    Chest pain    Constipation    Depression    Diverticulitis    Diverticulosis of colon    DOE (dyspnea on exertion)    DVT (deep venous thrombosis) (HCC)    Esophageal stricture    Hyperlipemia    Hypertension    IBS (irritable bowel syndrome)    Sleep apnea    Past Surgical History:  Procedure Laterality Date   APPENDECTOMY     BLOOD CLOT  2004   RIGHT LEG   CHOLECYSTECTOMY     COLON SURGERY     CRANIOTOMY Left 06/25/2023   Procedure: CRANIOTOMY HEMATOMA EVACUATION SUBDURAL;  Surgeon: Venetia Night, MD;  Location: [facility] ORS;  Service: Neurosurgery;  Laterality: Left;   GALLBLADDER SURGERY  2003   IR GASTROSTOMY TUBE MOD SED  07/05/2023   NISSEN FUNDOPLICATION     PLANTAR FASCIA SURGERY  2007   sigmoid colectomy  10/21/2010   pelvic anastomosis   VAGINAL HYSTERECTOMY     Patient Active Problem List   Diagnosis Date Noted   Seizure (HCC) 07/03/2023   Acute cystitis without hematuria 07/03/2023   Subdural hematoma (HCC) 06/25/2023   Midline shift of brain with brain compression (HCC) 06/25/2023   Glasgow coma scale total score 9-12 06/25/2023   Acute metabolic encephalopathy 03/01/2022   Anxiety and depression 03/01/2022   Dyslipidemia 03/01/2022   GERD without esophagitis 03/01/2022   SIRS  (systemic inflammatory response syndrome) (HCC) 02/28/2022   Gastroenteritis due to norovirus    Nausea vomiting and diarrhea    Hypomagnesemia    Hypokalemia    Hyperlipidemia    Gastroesophageal reflux disease without esophagitis    Intractable vomiting 08/08/2020   Esophagitis on CT 08/08/2020   SVT (supraventricular tachycardia) (HCC) 03/07/2019   Acute respiratory distress 11/16/2017   Acute respiratory failure (HCC) 11/16/2017   Screening for lipid disorders 07/28/2016   DOE (dyspnea on exertion) 07/02/2016   Bradycardia 07/02/2016   Anxiety 07/02/2016   Anxiety state    Atypical chest pain 03/08/2015   Fever 08/12/2010   ABDOMINAL PAIN, LEFT LOWER QUADRANT 06/23/2010   DIVERTICULITIS, ACUTE 02/25/2010   CONSTIPATION 01/22/2010   IRRITABLE BOWEL SYNDROME 01/22/2010   Primary hypertension 01/21/2010   HYPERTENSION, CONTROLLED 01/21/2010   DEEP VENOUS THROMBOPHLEBITIS 01/21/2010   ESOPHAGEAL STRICTURE 02/29/2004   Diverticulosis of colon 02/29/2004    ONSET DATE: 06/21/2023  REFERRING DIAG:  Diagnosis  I63.9 (ICD-10-CM) - CVA (cerebral vascular accident) (HCC)    THERAPY DIAG:  Right arm numbness - Plan: PT plan of care cert/re-cert  Muscle weakness (generalized)  Difficulty in walking, not elsewhere classified  Unsteadiness on feet  Rationale for Evaluation and  Treatment: Rehabilitation  SUBJECTIVE:                                                                                                                                                      SUBJECTIVE STATEMENT: Pt is a pleasant 73 y/o female referred to PT s/p SDH due to fall 06/21/2023. She reports she fell in her bathroom and hit her head. She did not immediately seek care, but later developed a bad headache. She has no memory of an ambulance being called, but does remember waking up in the hospital. Pt found to have bilat subdural hematoma & reports hospital stay of around 31 days. She received inpatient  PT/OT/Speech. She reports her balance is still off, was not using an AD prior to this but now uses a RW. She is not very confident in her balance. She reports no stumbles/falls since initial fall in bathroom. Pt mostly bathing on her own and dressing herself, although she does get some help from her daughter. Pt currently has a PEG tube, will be getting this removed soon. Other concerns consist of difficulty using RUE, difficulty opening things and buttoning clothes. She has RUE pain. Worst pain in past two weeks has been 10/10, lowest pain level in past two weeks has been 4/10. Pt also reports dizziness if she looks up or lays down, but states she has a long hx of vertigo of many years. Her neck can hurt when she turns her head.   Pt accompanied by: With DTR Melissa.  PERTINENT HISTORY:   Note via ED discharge:  "Brief HPI:  CORNESHIA HINES is a 73 y.o. female with history of HTN, DVT, esophageal stricture, OSA, depression; who was admitted on 06/25/2023. She with reports of fall 3 to 4 days prior to admission with headaches and mental status changes.  She had seizure type activity in ED and was found to have a large left cerebral SDH measuring 1.1 x 1.2 cm and rightward midline shift with early uncal herniation and mass effect on left lateral ventricular system.  Eliquis reversed, she was transfused with 1 units PRBC for anemia with hemoglobin of 6 and taken to the OR emergently for craniotomy for evacuation of hematoma.  Postop required Cardene drip for BP control and had issues with pain and agitation.  Repeat CT showed no significant change.  EEG was negative for seizures.  E. coli/Citrobacter UTI treated with 5-day course of ceftriaxone she continued to have fluctuating mental status and and fevers with urine showing > 100,000 colonies of enterococcus felt to be contaminant.  Dr. Marcell Barlow questioned need for MMA embolization pending repeat CT head in 4 weeks.  PAF managed with addition of metoprolol  added for rate control and MRI brain repeated on 02/26 revealing small acute cortical infarcts in left frontal and parietal lobe. She was  started on IV heparin per neurology input and Apixaban resumed on 02/28."  Please refer to chart/above for full PMH  PAIN:  Are you having pain? No not currently, occ finger numbness (R side) and RUE pain, had recent cortisone shot and this helped  PRECAUTIONS: Fall and Other: pt currently has PEG tube  RED FLAGS: Is going to see speech for swallowing/has had esophagus expansion in past    WEIGHT BEARING RESTRICTIONS: No  FALLS: Has patient fallen in last 6 months? Yes. Number of falls 1  LIVING ENVIRONMENT: Lives with: lives with their daughter Lives in: House/apartment Stairs:  5 steps with bilat handrails Has following equipment at home: Walker - 4 wheeled and shower chair  PLOF: Independent  PATIENT GOALS: Goals get back to being independent on her own. Was not living with daughter until d/c from hospital  OBJECTIVE:  Note: Objective measures were completed at Evaluation unless otherwise noted.  DIAGNOSTIC FINDINGS:  via chart  CT HEAD 07/22/23: "IMPRESSION: Expected evolution with regression of subdural hematoma on the left. No new or progressive finding.     Electronically Signed   By: Tiburcio Pea M.D.   On: 07/22/2023 08:30  "  US carotid bilat 07/11/23 "IMPRESSION: No significant stenosis of internal carotid arteries.     Electronically Signed   By: Acquanetta Belling M.D.   On: 07/11/2023 16:30"  MR brain 07/07/23: "IMPRESSION: 1. Small acute cortical infarcts within the left frontal and parietal lobes (deep to the cranioplasty). 2. Subdural hematomas overlying the bilateral cerebral hemispheres have not significantly changed in extent. However, new from the prior brain MRI of 06/27/2023, there is apparent restricted diffusion within these collections. This may simply reflect susceptibility artifact from blood products.  However, restricted diffusion can also be seen in subdural empyemas and empyemas cannot be excluded by imaging. 4 mm rightward midline shift, unchanged.     Electronically Signed   By: Jackey Loge D.O.   On: 07/07/2023 21:58"  COGNITION: Overall cognitive status: Within functional limits for tasks assessed   SENSATION: Pt reports impaired sensation RUE (dominant side) and impaired sensation in her feet occ  COORDINATION: WFL rapid alt LE, WFL heel>shin bilaterally WFL rapid alt UE bilaterally   EDEMA:  Reports she does get some swelling occ around her stomach, reports hx of digestive issues/celiac, current PEG tube, hx gallbladder surgery and other intestinal surgery?     POSTURE: rounded shoulders   LOWER EXTREMITY MMT:   Grossly 4+/5 bilat LE most deficits   RUE MMT:  BUE grossly 4 to 4+/5 - comparabale each side & no pain with testing  Grip strength 4+/5 bilat UE   TRANSFERS: Assistive device utilized: Environmental consultant - 4 wheeled  Sit to stand: SBA Stand to sit: SBA  GAIT: Gait pattern:  impaired gait speed, CGA provided or use of 4WW, observed decrease in trunk rotation Distance walked: clinic distances, 10 MWT Assistive device utilized: Walker - 4 wheeled and no AD but with close CGA  FUNCTIONAL TESTS:  5 times sit to stand: 13 sec hands-free :  0.96 m/s no AD CGA  decrease in trunk rotation SLB: <5 sec bilat LE  Tandem stance: within 15-25 sec, BLE extremely tremulous, use of hip strategy   BERG: not assessed  DGI: not assessed     PATIENT SURVEYS:  ABC scale 47.5%  TREATMENT DATE: 08/02/23   NMR: review of assessment findings and pt's current functional status, prognosis, plan, goals   PATIENT EDUCATION: Education details: assessment findings, plan, goals, prognosis Person educated: Patient and adult daughter Education  method: Explanation Education comprehension: verbalized understanding  HOME EXERCISE PROGRAM: To be initiated next 1-2 visits   GOALS: Goals reviewed with patient? Yes   SHORT TERM GOALS: Target date: 09/13/2023   Patient will be independent in home exercise program to improve strength/mobility for better functional independence with ADLs. Baseline: Goal status: INITIAL   LONG TERM GOALS: Target date: 10/25/2023   Patient will increase ABC scale score >80% to demonstrate better functional mobility and better confidence with ADLs.   Baseline: 47.5% Goal status: INITIAL  2.  Patient will complete five times sit to stand test in < 10 seconds indicating an increased LE strength and improved balance. Baseline: 13 sec hands-free Goal status: INITIAL  3.  Patient will increase Berg Balance score by > 6 points to demonstrate decreased fall risk during functional activities Baseline:  Goal status: INITIAL  4.  Patient will increase 10 meter walk test to >1.76m/s as to improve gait speed for better community ambulation and to reduce fall risk. Baseline: 0.96 m/s Goal status: INITIAL  5.  Patient will increase dynamic gait index score to >19/24 as to demonstrate reduced fall risk and improved dynamic gait balance for better safety with community/home ambulation.   Baseline:  Goal status: INITIAL   ASSESSMENT:  CLINICAL IMPRESSION: Patient is a pleasant 73 y.o. female who was seen today for physical therapy evaluation and treatment for SDH s/p fall with ED admission 06/25/2023. Exam findings indicate impairments of pain, sensation, strength, gait and balance. Pt's ABC questionnaire also indicates decreased balance confidence. The pt will benefit from further skilled PT to improve impairments in order to increase balance, strength, mobility, QOL and to decrease fall risk.  OBJECTIVE IMPAIRMENTS: Abnormal gait, decreased balance, decreased mobility, difficulty walking, decreased  strength, dizziness, increased edema, impaired sensation, impaired UE functional use, improper body mechanics, postural dysfunction, and pain.   ACTIVITY LIMITATIONS: carrying, lifting, bending, standing, squatting, stairs, transfers, dressing, and locomotion level  PARTICIPATION LIMITATIONS: meal prep, cleaning, laundry, driving, shopping, community activity, and yard work  PERSONAL FACTORS: Age, Sex, and 3+ comorbidities:  HTN, DVT, esophageal stricture, OSA, depression, please see above for extensive PMH  are also affecting patient's functional outcome.   REHAB POTENTIAL: Good  CLINICAL DECISION MAKING: Evolving/moderate complexity  EVALUATION COMPLEXITY: Moderate  PLAN:  PT FREQUENCY: 1-2x/week  PT DURATION: 12 weeks  PLANNED INTERVENTIONS: 97164- PT Re-evaluation, 97110-Therapeutic exercises, 97530- Therapeutic activity, 97112- Neuromuscular re-education, 97535- Self Care, [id number]- Manual therapy, [phone]- Gait training, [phone]- Orthotic Fit/training, [phone]- Canalith repositioning, Patient/Family education, Balance training, Stair training, Taping, Joint mobilization, Spinal mobilization, Vestibular training, DME instructions, Wheelchair mobility training, Cryotherapy, and Moist heat  PLAN FOR NEXT SESSION: BERG, DGI, initiate HEP   Baird Kay, PT 08/02/2023, 5:28 PM

## 2023-08-02 NOTE — Therapy (Signed)
 OUTPATIENT OCCUPATIONAL THERAPY NEURO EVALUATION  Patient Name: [name] MRN: [id number] DOB:06-Jun-1950, 73 y.o., female Today's Date: 08/02/2023  PCP: Bethann Punches, MD REFERRING PROVIDER: Jeanella Craze, PA-C  END OF SESSION:  OT End of Session - 08/02/23 1027     Visit Number 1    Number of Visits 24    Date for OT Re-Evaluation 10/25/23    Authorization Type Progress reporting period starting 08/02/2023    OT Start Time 0930    OT Stop Time 1015    OT Time Calculation (min) 45 min    Activity Tolerance Patient tolerated treatment well    Behavior During Therapy WFL for tasks assessed/performed             Past Medical History:  Diagnosis Date   Bradycardia    Chest pain    Constipation    Depression    Diverticulitis    Diverticulosis of colon    DOE (dyspnea on exertion)    DVT (deep venous thrombosis) (HCC)    Esophageal stricture    Hyperlipemia    Hypertension    IBS (irritable bowel syndrome)    Sleep apnea    Past Surgical History:  Procedure Laterality Date   APPENDECTOMY     BLOOD CLOT  2004   RIGHT LEG   CHOLECYSTECTOMY     COLON SURGERY     CRANIOTOMY Left 06/25/2023   Procedure: CRANIOTOMY HEMATOMA EVACUATION SUBDURAL;  Surgeon: Venetia Night, MD;  Location: [facility] ORS;  Service: Neurosurgery;  Laterality: Left;   GALLBLADDER SURGERY  2003   IR GASTROSTOMY TUBE MOD SED  07/05/2023   NISSEN FUNDOPLICATION     PLANTAR FASCIA SURGERY  2007   sigmoid colectomy  10/21/2010   pelvic anastomosis   VAGINAL HYSTERECTOMY     Patient Active Problem List   Diagnosis Date Noted   Seizure (HCC) 07/03/2023   Acute cystitis without hematuria 07/03/2023   Subdural hematoma (HCC) 06/25/2023   Midline shift of brain with brain compression (HCC) 06/25/2023   Glasgow coma scale total score 9-12 06/25/2023   Acute metabolic encephalopathy 03/01/2022   Anxiety and depression 03/01/2022   Dyslipidemia 03/01/2022   GERD without esophagitis  03/01/2022   SIRS (systemic inflammatory response syndrome) (HCC) 02/28/2022   Gastroenteritis due to norovirus    Nausea vomiting and diarrhea    Hypomagnesemia    Hypokalemia    Hyperlipidemia    Gastroesophageal reflux disease without esophagitis    Intractable vomiting 08/08/2020   Esophagitis on CT 08/08/2020   SVT (supraventricular tachycardia) (HCC) 03/07/2019   Acute respiratory distress 11/16/2017   Acute respiratory failure (HCC) 11/16/2017   Screening for lipid disorders 07/28/2016   DOE (dyspnea on exertion) 07/02/2016   Bradycardia 07/02/2016   Anxiety 07/02/2016   Anxiety state    Atypical chest pain 03/08/2015   Fever 08/12/2010   ABDOMINAL PAIN, LEFT LOWER QUADRANT 06/23/2010   DIVERTICULITIS, ACUTE 02/25/2010   CONSTIPATION 01/22/2010   IRRITABLE BOWEL SYNDROME 01/22/2010   Primary hypertension 01/21/2010   HYPERTENSION, CONTROLLED 01/21/2010   DEEP VENOUS THROMBOPHLEBITIS 01/21/2010   ESOPHAGEAL STRICTURE 02/29/2004   Diverticulosis of colon 02/29/2004    ONSET DATE: 06/25/2023  REFERRING DIAG: Subdural Hematoma  THERAPY DIAG:  Muscle weakness (generalized)  Other lack of coordination  Rationale for Evaluation and Treatment: Rehabilitation  SUBJECTIVE:   SUBJECTIVE STATEMENT: Pt. reports that she has been staying with her daughter since discharging from the hospital. Pt accompanied by: daughter: Melissa  PERTINENT HISTORY: Pt.  Is a 73 y.o female who sustained a Subdural Hematoma after hitting the side of her head on a wall when reaching up into her closet. Pt. Is s/p left sided craniotomy. PMHx includes: Acute CVA, HTN, Anxiety, Depression, Dyslipidemia. Pt. Has a Hx of Right rotator cuff tear. Per MRI imaging: supraspinatus tendon tear, and biceps tendon tear. Pt. Has received a cortizone shot in the right shoulder this past week.   PRECAUTIONS: No driving  WEIGHT BEARING RESTRICTIONS: No  PAIN:  Are you having pain? PEG tube: 4-5/10, 5-6/10  cold numbness pain right hand, 5/10 pain in the right shoulder   FALLS: Has patient fallen in last 6 months? Yes. Number of falls 1-into the wall reaching into her closet  LIVING ENVIRONMENT: Lives with: lives alone Lives in: Mobile home Stairs: 5 steps to enter with bilateral rails Has following equipment at home: Walker - 4 wheeled and shower chair  PLOF: Independent  PATIENT GOALS: To be able to do what she wants to when she wants.  OBJECTIVE:  Note: Objective measures were completed at Evaluation unless otherwise noted.  HAND DOMINANCE: Right  ADLs:  Transfers/ambulation related to ADLs: Eating: Occasional spillage, independent using a fork for soft food. Difficulty spearing firm food, and difficulty cutting food. Difficulty holding a drink, and opening a bottle. Grooming: switches hand when opening toothpaste, uses left to brush teeth after trying with the right.  Independent with finger nails. Independent with hair care.  UB Dressing: Independent with shirt, bra. Difficulty buttoning at times, cuff buttons LB Dressing: Independent, slide on shoes Toileting: Independent Bathing:  Sink side baths, daughter assist with back Tub Shower transfers:  Has transfer tube bench, however is scared    IADLs: Shopping: Accompanied by daughter to [employer] housekeeping:  Daughter performs. Pt. Assisted with making the bed. (Daughter waiting for [place] to give the okay for Pt. 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  Uses rollator for longer distance,  POSTURE COMMENTS:  No Significant postural limitations Sitting balance: intact  ACTIVITY TOLERANCE: Activity tolerance:  Fair  FUNCTIONAL OUTCOME MEASURES: TBD  UPPER EXTREMITY ROM:    Active ROM Right eval Left Eval [facility] overall  Shoulder flexion 132(135)   Shoulder abduction 112(119)   Shoulder adduction    Shoulder extension    Shoulder internal rotation    Shoulder external rotation    Elbow flexion WFL   Elbow extension WFL   Wrist flexion    Wrist extension 30(42)   Wrist ulnar deviation    Wrist radial deviation    Wrist pronation    Wrist supination    (Blank rows = not tested)  Thumb opposition: Right: limited to the the tip of the 4th digit Left: WFL  UPPER EXTREMITY MMT:     MMT Right eval Left Eval 5/5 overall  Shoulder flexion 4-/5   Shoulder abduction 4-/5   Shoulder adduction    Shoulder extension    Shoulder internal rotation    Shoulder external rotation    Middle trapezius    Lower trapezius  Elbow flexion 4/5   Elbow extension 4/5   Wrist flexion    Wrist extension 4-/5   Wrist ulnar deviation    Wrist radial deviation    Wrist pronation    Wrist supination    (Blank rows = not tested)  HAND FUNCTION: Grip strength: Right: 5 lbs; Left: 40 lbs, Lateral pinch: Right: 10 lbs, Left: 16 lbs, and 3 point pinch: Right: 9 lbs, Left: 14 lbs  COORDINATION: 9 Hole Peg test: Right: 60 sec; Left: 23 sec  SENSATION: WFL Light touch: WFL Proprioception: WFL  Positive numbness, cold sensation in the right thumb, 3rd, and 4th digits.  EDEMA: N/A  MUSCLE TONE: Intact  COGNITION: Overall cognitive status: Within functional limits for tasks assessed  VISION: Subjective report:  Limited vision as Pt. was scheduled for bilateral cataract surgery. However due to the injury was unable to attend. No change in vision as a result of the injury  PERCEPTION: TBD  PRAXIS: Impaired:  Ideomotor   TREATMENT DATE: 08/02/2023   Initial eval completed. Education was provided as indicated below.  PATIENT EDUCATION: Education details: OT services, POC, goals, Pt. Current ADL/IADL functional status, and UE status Person educated: Patient and Child(ren) Education method: Explanation, Demonstration, and Verbal cues Education comprehension: verbalized understanding, returned demonstration, verbal cues required, and needs further education  HOME EXERCISE PROGRAM:  Continue to assess, and provide as indicated   GOALS: Goals reviewed with patient? Yes  SHORT TERM GOALS: Target date: 09/13/2023  Pt. Will be independent with HEPs for the RUE, and hand. Baseline: Eval: No current HEP. Goal status: INITIAL  LONG TERM GOALS: Target date: 10/25/2023  Pt, will improve Right shoulder ROM by 10 degrees with assist rea up into cabinetry, and closets. Baseline: Eval: Right shoulder flexion: 132(135), abduction: 112(119) Goal status: INITIAL  2.  Pt. Will improve right grip strength by 5# to be able to hold a beverage securely. Baseline: Eval: R: 5#, L: 40#. Pt. Has difficulty securely holding a beverage Goal status: INITIAL  3.  Pt. Will improve right pinch strength by 3# to be able to open a bottle. Baseline: Eval: Lateral pinch: R: 10#, L: 16#, 3 pt. pinch: R: 9#, L: 14# Goal status: INITIAL  4.  Pt. Will improve right hand [facility] skills by 5 sec. To be able to efficiently manipulate small objects for ADL/IADLs Baseline: Eval: 9 hole peg test: R: 60 sec.; Left: 23 sec. Goal status: INITIAL  5.   Pt. Will perform light home making tasks with Modified independence. Baseline: Eval: Daughter is completing home management tasks, and waiting the okay before Pt. Completes  Goal status: INITIAL  6.  Pt. Will use utensils with modified independence  Baseline: Eval: Pt. has difficulty spearing food with a fork, and cutting food.  Goal status: INITIAL   ASSESSMENT:  CLINICAL  IMPRESSION:  Patient is a 73 y.o. female who was seen today for occupational therapy evaluation for Subdural Hematoma s/p evacuation. Pt. presents with decreased AROM in the right shoulder decreased UE strength, grip strength, pinch strength, and FMC skills which limit her ability to use a fork to spear firm food, cut food, open bottles, jars, and hold items securely in her hand. Pt. 's daughter has been staying with the Pt. for the 1st two weeks following discharge, however plans to return to work next week. Pt.'s daughter works close by, and the Pt. plans to stay alone while she is working during the day. Pt. will be staying alone at her  daughter home. Pt. will benefit from OT services to work on improving RUE functioning in order to improve engagement in, and maximize independence with ADLs, and IADL tasks.   PERFORMANCE DEFICITS: in functional skills including ADLs, IADLs, coordination, dexterity, ROM, strength, Fine motor control, Gross motor control, decreased knowledge of use of DME, and UE functional use, cognitive skills including , and psychosocial skills including coping strategies, environmental adaptation, and routines and behaviors.   IMPAIRMENTS: are limiting patient from ADLs, IADLs, and leisure.   CO-MORBIDITIES: may have co-morbidities  that affects occupational performance. Patient will benefit from skilled OT to address above impairments and improve overall function.  MODIFICATION OR ASSISTANCE TO COMPLETE EVALUATION: Min-Moderate modification of tasks or assist with assess necessary to complete an evaluation.  OT OCCUPATIONAL PROFILE AND HISTORY: Detailed assessment: Review of records and additional review of physical, cognitive, psychosocial history related to current functional performance.  CLINICAL DECISION MAKING: Moderate - several treatment options, min-mod task modification necessary  REHAB POTENTIAL: Good  EVALUATION COMPLEXITY: Moderate    PLAN:  OT FREQUENCY:  2x/week  OT DURATION: 12 weeks  PLANNED INTERVENTIONS: 97535 self care/ADL training, 16109 therapeutic exercise, 97530 therapeutic activity, 97112 neuromuscular re-education, 97140 manual therapy, 97018 paraffin, 60454 contrast bath, [id number] Cognitive training (first 15 min), 09811 Cognitive training(each additional 15 min), 91478 Orthotics management and training, 29562 Splinting (initial encounter), patient/family education, and DME and/or AE instructions  RECOMMENDED OTHER SERVICES:   PT, ST  CONSULTED AND AGREED WITH PLAN OF CARE: Patient and family member/caregiver: Melissa  PLAN FOR NEXT SESSION: Treatment   Olegario Messier, MS, OTR/L  08/02/2023, 10:29 AM

## 2023-08-02 NOTE — Therapy (Addendum)
 OUTPATIENT SPEECH LANGUAGE PATHOLOGY APHASIA EVALUATION DYSPHAGIA EVALUATION   Patient Name: [name] MRN: [id number] DOB:1950-06-27, 73 y.o., female Today's Date: 08/02/2023  PCP: Bethann Punches, MD REFERRING PROVIDER: Jeanella Craze, PA-C   End of Session - 08/02/23 [phone]     Visit Number 1    Number of Visits 25    Date for SLP Re-Evaluation 10/25/23    Authorization Type Medicare A/B    Progress Note Due on Visit 10    SLP Start Time 0845    SLP Stop Time  0930    SLP Time Calculation (min) 45 min    Activity Tolerance Patient tolerated treatment well             Past Medical History:  Diagnosis Date   Bradycardia    Chest pain    Constipation    Depression    Diverticulitis    Diverticulosis of colon    DOE (dyspnea on exertion)    DVT (deep venous thrombosis) (HCC)    Esophageal stricture    Hyperlipemia    Hypertension    IBS (irritable bowel syndrome)    Sleep apnea    Past Surgical History:  Procedure Laterality Date   APPENDECTOMY     BLOOD CLOT  2004   RIGHT LEG   CHOLECYSTECTOMY     COLON SURGERY     CRANIOTOMY Left 06/25/2023   Procedure: CRANIOTOMY HEMATOMA EVACUATION SUBDURAL;  Surgeon: Venetia Night, MD;  Location: [facility] ORS;  Service: Neurosurgery;  Laterality: Left;   GALLBLADDER SURGERY  2003   IR GASTROSTOMY TUBE MOD SED  07/05/2023   NISSEN FUNDOPLICATION     PLANTAR FASCIA SURGERY  2007   sigmoid colectomy  10/21/2010   pelvic anastomosis   VAGINAL HYSTERECTOMY     Patient Active Problem List   Diagnosis Date Noted   Seizure (HCC) 07/03/2023   Acute cystitis without hematuria 07/03/2023   Subdural hematoma (HCC) 06/25/2023   Midline shift of brain with brain compression (HCC) 06/25/2023   Glasgow coma scale total score 9-12 06/25/2023   Acute metabolic encephalopathy 03/01/2022   Anxiety and depression 03/01/2022   Dyslipidemia 03/01/2022   GERD without esophagitis 03/01/2022   SIRS (systemic inflammatory response  syndrome) (HCC) 02/28/2022   Gastroenteritis due to norovirus    Nausea vomiting and diarrhea    Hypomagnesemia    Hypokalemia    Hyperlipidemia    Gastroesophageal reflux disease without esophagitis    Intractable vomiting 08/08/2020   Esophagitis on CT 08/08/2020   SVT (supraventricular tachycardia) (HCC) 03/07/2019   Acute respiratory distress 11/16/2017   Acute respiratory failure (HCC) 11/16/2017   Screening for lipid disorders 07/28/2016   DOE (dyspnea on exertion) 07/02/2016   Bradycardia 07/02/2016   Anxiety 07/02/2016   Anxiety state    Atypical chest pain 03/08/2015   Fever 08/12/2010   ABDOMINAL PAIN, LEFT LOWER QUADRANT 06/23/2010   DIVERTICULITIS, ACUTE 02/25/2010   CONSTIPATION 01/22/2010   IRRITABLE BOWEL SYNDROME 01/22/2010   Primary hypertension 01/21/2010   HYPERTENSION, CONTROLLED 01/21/2010   DEEP VENOUS THROMBOPHLEBITIS 01/21/2010   ESOPHAGEAL STRICTURE 02/29/2004   Diverticulosis of colon 02/29/2004    ONSET DATE: 06/25/2023; date of referral  07/22/2023  REFERRING DIAG: I63.9 (ICD-10-CM) - CVA (cerebral vascular accident) (HCC)   THERAPY DIAG:  Aphasia  Apraxia  Subdural hematoma (HCC)  Rationale for Evaluation and Treatment Rehabilitation  SUBJECTIVE:   SUBJECTIVE STATEMENT: Pt pleasant, recognizes this writer from inpatient stay Pt accompanied by: family member (her daughter [name])  PERTINENT HISTORY and DIAGNOSTIC FINDINGS: Pt is a 73 year old female with medical hx significant for: HTN, hyperlipidemia, IBS, anxiety/depression, diverticulitis, OSA, PAF, DVT on Eliquis. Pt presented to [facility] on 06/25/23 with complaint of headaches. Pt had an unwitnessed fall 4 days prior to presentation.  CT revealed large left SDH and resulting midline shift. Pt had possible seizure episode in triage.  Pt underwent emergent left-sided craniotomy for evacuation of subdural hematoma by Dr. Myer Haff on 06/25/23. Repeat CT on 2/16  which showed no evidence of recurrent SDH or stroke. Neurology consulted after possible seizure noted on 06/28/23. MRI was negative for acute changes. EEG on 06/28/23 was suggestive of cortical dysfunction arising from left temporal region likely secondary to SDH/cranioplasty. No seizures were seen. UA positive on 2/21. Started on IV ceftriaxone. PEG tube placed by IR on 07/05/23. Repeat CT was stable. 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 MRI of right shoulder showed rotator cuff tear. Pt with ST services during CIR admission 07/13/2023 thru 07/24/2023 when she discharged to her daughter's home.   PAIN:  Are you having pain? No  FALLS: Has patient fallen in last 6 months?  See PT evaluation for details  LIVING ENVIRONMENT: Lives with: lives with their daughter Lives in: House/apartment  PLOF:  Level of assistance: Independent with ADLs, Independent with IADLs Employment: Retired   PATIENT GOALS    To improve cognitive linguistic abilities   OBJECTIVE:  COGNITION: Overall cognitive status: Within functional limits for tasks assessed  AUDITORY COMPREHENSION: Overall auditory comprehension: Appears intact YES/NO questions: Appears intact Following directions: Appears intact Conversation: Moderately Complex  READING COMPREHENSION: to be further assessed  EXPRESSION: verbal  VERBAL EXPRESSION: Overall verbal expression: Impaired: complex Level of generative/spontaneous verbalization: sentence Automatic speech: name: intact and social response: intact  Repetition: Appears intact Naming: Responsive: 76-100%, Confrontation: 76-100%, Convergent: 76-100%, and Divergent: 51-75% Pragmatics: Appears intact Comments: pt presents with semantic paraphasias and higher level word finding deficits  Effective technique: semantic cues Non-verbal means of communication: N/A  WRITTEN EXPRESSION: Dominant hand: right  Written expression:  difficulty noted  d/t RUE impairment  MOTOR SPEECH: Overall motor speech: Appears intact Phonation: normal Resonance: WFL Articulation: Appears intact Intelligibility: Intelligible Motor planning: Appears intact  ORAL MOTOR EXAMINATION Facial : WFL Lingual: WFL Velum: WFL Mandible: WFL Cough: WFL Voice: WFL   CLINICAL SWALLOW ASSESSMENT:   Current diet: regular and thin liquids Feeding: able to feed self Consistencies tested: Thin Liquid: Presentation: Cup Oral Phase: WFL Pharyngeal Phase: WFL   Evaluation findings: Patient presents with oropharyngeal swallow which appears clinically to be within functional limits with adequate airway protection. Oral stage is characterized by appearance of adequate oral containment, mastication, bolus formation, oral transfer and oral clearance. Swallow initiation appears timely. No overt signs of aspiration observed despite challenging with consecutive straw sips of thin liquids in excess of 3oz.  Aspiration risk factors:History of CVA Overall aspiration risk:Mild Diet Recommendations: regular and thin liquids Precautions:Minimize environmental distractions, Slow rate, Small sips/bites, Seated upright 90 degrees, and Remain upright for at least 30 minutes after meals Supervision: Patient able to feed self Oral care recommendations:Oral care BID Follow-up recommendations: Other: can consider GI follow up if needed, pt well versed on GI symptoms and both she and her daughter report that pt is managing well   STANDARDIZED ASSESSMENTS: Pt's phonemic and semantic verbal fluency was measured by the abilitiy to generate words beginning with a specific letter (e.g., FAS) and  semantic category (e.g., animals). Verbal Fluency has been demonstrated to be sensitive to lesions in the frontal lobe, temporal lobe and caudate nucleus (Butters et al. Cathie Beams), Alzheimer's disease Sandria Bales, Cherlynn Perches, Eddie Candle, & Kingsland 1996), Huntington's disease Mary Sella et al. Cathie Beams), amnesia  Mary Sella et. al 1987), and traumatic brain injury (Rasking & Rearick, 1996).   Patients were asked to generate as words as possible in 1 minute for the letters F, A, and S (phonemic fluency) with further instructions that nouns and multiple words using the same stem with a different suffix (e.g., friend, friends, friendly) were not acceptable.   When compared to peers of the same and educational level, pt's abilities were considered:   F: 8 A:3 S:10  Total Number of Words Recalled: 21;  giving patient a percentile score 10 when compared to female ages 60-79 years years with an education level of 9-12.   Animals: 18 Giving patient a percentile score of 80 when compared to female ages 24-79 years with an education level of 9-12     PATIENT REPORTED OUTCOME MEASURES (PROM):  The Communication Effectiveness Survey is a patient-reported outcome measure in which the patient rates their own effectiveness in different communication situations. A higher score indicates greater effectiveness.   Pt's self-rating was 30/32.   Having a conversation with a family member or friends at home. 4- Very Effective Participating in conversation with strangers in a quiet place. 3 Conversing with a familiar person over the telephone. 4- Very Effective Conversing with a stranger over the telephone. 4- Very Effective Being part of a conversation in a noisy environment (social gathering). 4- Very Effective Speaking to a friend when you are emotionally upset or you are angry. 4- Very Effective Having a conversation while traveling in a car. 4- Very Effective Having a conversation with someone at a distance (across a room). 3   TODAY'S TREATMENT:  Skilled verbal information provided on word finding hierarchies/complexity.    PATIENT EDUCATION: Education details: results of this assessment, ST POC Person educated: Patient and Child(ren) Education method: Explanation Education comprehension: needs further  education  HOME EXERCISE PROGRAM:   N/A    GOALS:  Goals reviewed with patient? Yes  SHORT TERM GOALS: Target date: 10 sessions  With Supervision A, patient will complete a semantic feature analysis with at least 4 relevant features for 7/9 target words to improve word-finding skills.  Baseline: Goal status: INITIAL  2.  With supervision A, pt will generate at least 10 items with complex abstract category in 7 out of 9 opportunities.  Baseline:  Goal status: INITIAL   LONG TERM GOALS: Target date: 10/27/2023  With Mod I, patient will participate in complex conversation at 95% accuracy to increase ability to communicate complex thoughts, feelings, and needs.  Baseline:  Goal status: INITIAL  2.  With Mod I, patient/family will demonstrate understanding of the following concepts: aphasia, spontaneous recovery, communication vs conversation, strengths/strategies to promote success, local resources by answering multiple choice questions with 90% accuracy when provided supported conversation in order to increase patient's participation in medical care.  Baseline:  Goal status: INITIAL   ASSESSMENT:  CLINICAL IMPRESSION: Patient is a 73 y.o. right handed female who was seen today for a speech language evaluation d/t large left SDH. Pt presents with much improved verbal communication and currently presents with mild higher level language impairment c/b intermittent word finding deficits and semantic paraphasias.   OBJECTIVE IMPAIRMENTS include expressive language, aphasia, and apraxia. These impairments are limiting patient from  managing medications, managing appointments, managing finances, household responsibilities, ADLs/IADLs, and effectively communicating at home and in community. Factors affecting potential to achieve goals and functional outcome are  N/A . Patient will benefit from skilled SLP services to address above impairments and improve overall function.  REHAB  POTENTIAL: Good  PLAN: SLP FREQUENCY: 2x/week  SLP DURATION: 12 weeks  PLANNED INTERVENTIONS: Language facilitation, Cueing hierachy, Functional tasks, Multimodal communication approach, SLP instruction and feedback, Compensatory strategies, and Patient/family education    Macguire Holsinger B. Dreama Saa, M.S., CCC-SLP, Tree surgeon Certified Brain Injury Specialist [facility]  [facility] Office [phone] Ascom [phone] Fax [phone]

## 2023-08-05 ENCOUNTER — Encounter: Payer: PPO | Admitting: Neurosurgery

## 2023-08-06 ENCOUNTER — Ambulatory Visit: Admitting: Physical Therapy

## 2023-08-06 ENCOUNTER — Ambulatory Visit: Admitting: Speech Pathology

## 2023-08-06 ENCOUNTER — Ambulatory Visit

## 2023-08-06 DIAGNOSIS — R2681 Unsteadiness on feet: Secondary | ICD-10-CM

## 2023-08-06 DIAGNOSIS — S065XAA Traumatic subdural hemorrhage with loss of consciousness status unknown, initial encounter: Secondary | ICD-10-CM

## 2023-08-06 DIAGNOSIS — R262 Difficulty in walking, not elsewhere classified: Secondary | ICD-10-CM

## 2023-08-06 DIAGNOSIS — R200 Anesthesia of skin: Secondary | ICD-10-CM | POA: Diagnosis not present

## 2023-08-06 DIAGNOSIS — M6281 Muscle weakness (generalized): Secondary | ICD-10-CM

## 2023-08-06 DIAGNOSIS — R278 Other lack of coordination: Secondary | ICD-10-CM

## 2023-08-06 NOTE — Therapy (Signed)
 OUTPATIENT PHYSICAL THERAPY NEURO EVALUATION   Patient Name: [name] MRN: [id number] DOB:1951-03-03, 73 y.o., female Today's Date: 08/06/2023   PCP: [name], MD REFERRING PROVIDER: Trey Paula, PA-C  END OF SESSION:  PT End of Session - 08/06/23 0808     Visit Number 2    Number of Visits 25    Date for PT Re-Evaluation 10/25/23    Progress Note Due on Visit 10    PT Start Time 0845    PT Stop Time 0926    PT Time Calculation (min) 41 min    Equipment Utilized During Treatment Gait belt    Activity Tolerance Patient tolerated treatment well    Behavior During Therapy WFL for tasks assessed/performed             Past Medical History:  Diagnosis Date   Bradycardia    Chest pain    Constipation    Depression    Diverticulitis    Diverticulosis of colon    DOE (dyspnea on exertion)    DVT (deep venous thrombosis) (HCC)    Esophageal stricture    Hyperlipemia    Hypertension    IBS (irritable bowel syndrome)    Sleep apnea    Past Surgical History:  Procedure Laterality Date   APPENDECTOMY     BLOOD CLOT  2004   RIGHT LEG   CHOLECYSTECTOMY     COLON SURGERY     CRANIOTOMY Left 06/25/2023   Procedure: CRANIOTOMY HEMATOMA EVACUATION SUBDURAL;  Surgeon: Venetia Night, MD;  Location: [facility] ORS;  Service: Neurosurgery;  Laterality: Left;   GALLBLADDER SURGERY  2003   IR GASTROSTOMY TUBE MOD SED  07/05/2023   NISSEN FUNDOPLICATION     PLANTAR FASCIA SURGERY  2007   sigmoid colectomy  10/21/2010   pelvic anastomosis   VAGINAL HYSTERECTOMY     Patient Active Problem List   Diagnosis Date Noted   Seizure (HCC) 07/03/2023   Acute cystitis without hematuria 07/03/2023   Subdural hematoma (HCC) 06/25/2023   Midline shift of brain with brain compression (HCC) 06/25/2023   Glasgow coma scale total score 9-12 06/25/2023   Acute metabolic encephalopathy 03/01/2022   Anxiety and depression 03/01/2022   Dyslipidemia 03/01/2022   GERD  without esophagitis 03/01/2022   SIRS (systemic inflammatory response syndrome) (HCC) 02/28/2022   Gastroenteritis due to norovirus    Nausea vomiting and diarrhea    Hypomagnesemia    Hypokalemia    Hyperlipidemia    Gastroesophageal reflux disease without esophagitis    Intractable vomiting 08/08/2020   Esophagitis on CT 08/08/2020   SVT (supraventricular tachycardia) (HCC) 03/07/2019   Acute respiratory distress 11/16/2017   Acute respiratory failure (HCC) 11/16/2017   Screening for lipid disorders 07/28/2016   DOE (dyspnea on exertion) 07/02/2016   Bradycardia 07/02/2016   Anxiety 07/02/2016   Anxiety state    Atypical chest pain 03/08/2015   Fever 08/12/2010   ABDOMINAL PAIN, LEFT LOWER QUADRANT 06/23/2010   DIVERTICULITIS, ACUTE 02/25/2010   CONSTIPATION 01/22/2010   IRRITABLE BOWEL SYNDROME 01/22/2010   Primary hypertension 01/21/2010   HYPERTENSION, CONTROLLED 01/21/2010   DEEP VENOUS THROMBOPHLEBITIS 01/21/2010   ESOPHAGEAL STRICTURE 02/29/2004   Diverticulosis of colon 02/29/2004    ONSET DATE: 06/21/2023  REFERRING DIAG:  Diagnosis  I63.9 (ICD-10-CM) - CVA (cerebral vascular accident) (HCC)    THERAPY DIAG:  Muscle weakness (generalized)  Difficulty in walking, not elsewhere classified  Unsteadiness on feet  Rationale for Evaluation and Treatment: Rehabilitation  SUBJECTIVE:                                                                                                                                                      SUBJECTIVE STATEMENT:  Pt reports no changes form last session. Is eager to improve her function to get back to previously independent lifestyle   From Eval: Pt is a pleasant 73 y/o female referred to PT s/p SDH due to fall 06/21/2023. She reports she fell in her bathroom and hit her head. She did not immediately seek care, but later developed a bad headache. She has no memory of an ambulance being called, but does remember waking up in  the hospital. Pt found to have bilat subdural hematoma & reports hospital stay of around 31 days. She received inpatient PT/OT/Speech. She reports her balance is still off, was not using an AD prior to this but now uses a RW. She is not very confident in her balance. She reports no stumbles/falls since initial fall in bathroom. Pt mostly bathing on her own and dressing herself, although she does get some help from her daughter. Pt currently has a PEG tube, will be getting this removed soon. Other concerns consist of difficulty using RUE, difficulty opening things and buttoning clothes. She has RUE pain. Worst pain in past two weeks has been 10/10, lowest pain level in past two weeks has been 4/10. Pt also reports dizziness if she looks up or lays down, but states she has a long hx of vertigo of many years. Her neck can hurt when she turns her head.   Pt accompanied by: With DTR Melissa.  PERTINENT HISTORY:   Note via ED discharge:  "Brief HPI:  [name] is a 73 y.o. female with history of HTN, DVT, esophageal stricture, OSA, depression; who was admitted on 06/25/2023. She with reports of fall 3 to 4 days prior to admission with headaches and mental status changes.  She had seizure type activity in ED and was found to have a large left cerebral SDH measuring 1.1 x 1.2 cm and rightward midline shift with early uncal herniation and mass effect on left lateral ventricular system.  Eliquis reversed, she was transfused with 1 units PRBC for anemia with hemoglobin of 6 and taken to the OR emergently for craniotomy for evacuation of hematoma.  Postop required Cardene drip for BP control and had issues with pain and agitation.  Repeat CT showed no significant change.  EEG was negative for seizures.  E. coli/Citrobacter UTI treated with 5-day course of ceftriaxone she continued to have fluctuating mental status and and fevers with urine showing > 100,000 colonies of enterococcus felt to be contaminant.  Dr.  Marcell Barlow questioned need for MMA embolization pending repeat CT head in 4 weeks.  PAF managed with addition of metoprolol added  for rate control and MRI brain repeated on 02/26 revealing small acute cortical infarcts in left frontal and parietal lobe. She was started on IV heparin per neurology input and Apixaban resumed on 02/28."  Please refer to chart/above for full PMH  PAIN:  Are you having pain? No not currently, occ finger numbness (R side) and RUE pain, had recent cortisone shot and this helped  PRECAUTIONS: Fall and Other: pt currently has PEG tube  RED FLAGS: Is going to see speech for swallowing/has had esophagus expansion in past    WEIGHT BEARING RESTRICTIONS: No  FALLS: Has patient fallen in last 6 months? Yes. Number of falls 1  LIVING ENVIRONMENT: Lives with: lives with their daughter Lives in: House/apartment Stairs:  5 steps with bilat handrails Has following equipment at home: Walker - 4 wheeled and shower chair  PLOF: Independent  PATIENT GOALS: Goals get back to being independent on her own. Was not living with daughter until d/c from hospital  OBJECTIVE:  Note: Objective measures were completed at Evaluation unless otherwise noted.  DIAGNOSTIC FINDINGS:  via chart  CT HEAD 07/22/23: "IMPRESSION: Expected evolution with regression of subdural hematoma on the left. No new or progressive finding.     Electronically Signed   By: Tiburcio Pea M.D.   On: 07/22/2023 08:30  "  US carotid bilat 07/11/23 "IMPRESSION: No significant stenosis of internal carotid arteries.     Electronically Signed   By: Acquanetta Belling M.D.   On: 07/11/2023 16:30"  MR brain 07/07/23: "IMPRESSION: 1. Small acute cortical infarcts within the left frontal and parietal lobes (deep to the cranioplasty). 2. Subdural hematomas overlying the bilateral cerebral hemispheres have not significantly changed in extent. However, new from the prior brain MRI of 06/27/2023, there is  apparent restricted diffusion within these collections. This may simply reflect susceptibility artifact from blood products. However, restricted diffusion can also be seen in subdural empyemas and empyemas cannot be excluded by imaging. 4 mm rightward midline shift, unchanged.     Electronically Signed   By: Jackey Loge D.O.   On: 07/07/2023 21:58"  COGNITION: Overall cognitive status: Within functional limits for tasks assessed   SENSATION: Pt reports impaired sensation RUE (dominant side) and impaired sensation in her feet occ  COORDINATION: WFL rapid alt LE, WFL heel>shin bilaterally WFL rapid alt UE bilaterally   EDEMA:  Reports she does get some swelling occ around her stomach, reports hx of digestive issues/celiac, current PEG tube, hx gallbladder surgery and other intestinal surgery?     POSTURE: rounded shoulders   LOWER EXTREMITY MMT:   Grossly 4+/5 bilat LE most deficits   RUE MMT:  BUE grossly 4 to 4+/5 - comparabale each side & no pain with testing  Grip strength 4+/5 bilat UE   TRANSFERS: Assistive device utilized: Environmental consultant - 4 wheeled  Sit to stand: SBA Stand to sit: SBA  GAIT: Gait pattern:  impaired gait speed, CGA provided or use of 4WW, observed decrease in trunk rotation Distance walked: clinic distances, 10 MWT Assistive device utilized: Walker - 4 wheeled and no AD but with close CGA  FUNCTIONAL TESTS:  5 times sit to stand: 13 sec hands-free :  0.96 m/s no AD CGA  decrease in trunk rotation SLB: <5 sec bilat LE  Tandem stance: within 15-25 sec, BLE extremely tremulous, use of hip strategy   BERG: not assessed  DGI: not assessed     PATIENT SURVEYS:  ABC scale 47.5%  TREATMENT DATE: 08/06/23 PHYSICAL PERFORMANCE Patient demonstrates increased fall risk as noted by score of  47 /56 on Berg Balance Scale.   (<36= high risk for falls, close to 100%; 37-45 significant >80%; 46-51 moderate >50%; 52-55 lower >25%)  [facility] PT Assessment - 08/06/23 0001       Standardized Balance Assessment   Standardized Balance Assessment Berg Balance Test;Dynamic Gait Index      Berg Balance Test   Sit to Stand Able to stand without using hands and stabilize independently    Standing Unsupported Able to stand safely 2 minutes    Sitting with Back Unsupported but Feet Supported on Floor or Stool Able to sit safely and securely 2 minutes    Stand to Sit Sits safely with minimal use of hands    Transfers Able to transfer safely, minor use of hands    Standing Unsupported with Eyes Closed Able to stand 10 seconds safely    Standing Unsupported with Feet Together Able to place feet together independently and stand 1 minute safely    From Standing, Reach Forward with Outstretched Arm Can reach forward >12 cm safely (5")    From Standing Position, Pick up Object from Floor Able to pick up shoe, needs supervision    From Standing Position, Turn to Look Behind Over each Shoulder Looks behind one side only/other side shows less weight shift    Turn 360 Degrees Able to turn 360 degrees safely but slowly    Standing Unsupported, Alternately Place Feet on Step/Stool Able to stand independently and safely and complete 8 steps in 20 seconds    Standing Unsupported, One Foot in Front Able to plae foot ahead of the other independently and hold 30 seconds    Standing on One Leg Tries to lift leg/unable to hold 3 seconds but remains standing independently    Total Score 47      Dynamic Gait Index   Level Surface Normal    Change in Gait Speed Mild Impairment    Gait with Horizontal Head Turns Mild Impairment    Gait with Vertical Head Turns Mild Impairment    Gait and Pivot Turn Normal    Step Over Obstacle Mild Impairment    Step Around Obstacles Normal    Steps Mild Impairment    Total Score 19            PT  instructed pt in DGI. See below for results. Demonstrates increased fall risk with score of 19/24. (<19 indicates increased fall risk)    TE- To improve strength, endurance, mobility, and function of specific targeted muscle groups or improve joint range of motion or improve muscle flexibility  Access Code: [email or web address] URL: [email or web address] Date: 08/06/2023 Prepared by: Thresa Ross  Exercises - Standing Single Leg Stance with Counter Support  - 1 x daily - 7 x weekly - 2 sets - 30 sec  hold - Sit to Stand with Arms Crossed  - 1 x daily - 7 x weekly - 2 sets - 10 reps - Narrow Stance with Head Nods and Counter Support- diffiulty with full arc of motion secondary to neck pain  - 1 x daily - 7 x weekly - 2 sets - 30 sec  hold  PATIENT EDUCATION: Education details: assessment findings, plan, goals, prognosis Person educated: Patient and adult daughter Education method: Explanation Education comprehension: verbalized understanding  HOME EXERCISE PROGRAM: Access Code: [email or web address] URL: [email or web address] Date: 08/06/2023 Prepared by: Thresa Ross  Exercises - Standing Single Leg  Stance with Counter Support  - 1 x daily - 7 x weekly - 2 sets - 30 sec  hold - Sit to Stand with Arms Crossed  - 1 x daily - 7 x weekly - 2 sets - 10 reps - Narrow Stance with Head Nods and Counter Support  - 1 x daily - 7 x weekly - 2 sets - 30 sec  hold  GOALS: Goals reviewed with patient? Yes   SHORT TERM GOALS: Target date: 09/13/2023   Patient will be independent in home exercise program to improve strength/mobility for better functional independence with ADLs. Baseline: Goal status: INITIAL   LONG TERM GOALS: Target date: 10/25/2023   Patient will increase ABC scale score >80% to demonstrate better functional mobility and better confidence with ADLs.   Baseline: 47.5% Goal status: INITIAL  2.  Patient will complete five times sit to stand test in < 10  seconds indicating an increased LE strength and improved balance. Baseline: 13 sec hands-free Goal status: INITIAL  3.  Patient will increase Berg Balance score by > 6 points to demonstrate decreased fall risk during functional activities Baseline: 47 Goal status: INITIAL  4.  Patient will increase 10 meter walk test to >1.14m/s as to improve gait speed for better community ambulation and to reduce fall risk. Baseline: 0.96 m/s Goal status: INITIAL  5.  Patient will increase dynamic gait index score to >19/24 as to demonstrate reduced fall risk and improved dynamic gait balance for better safety with community/home ambulation.   Baseline: 19 Goal status: INITIAL   ASSESSMENT:  CLINICAL IMPRESSION:  Patient is a pleasant 73 y.o. female who was seen today for physical therapy treatment  and treatment for SDH s/p fall with ED admission 06/25/2023. Pt demonstrates balance and mobility impairments AEB Dgi and BERG but shows good potential for significant mobility improvements based on her improvement since her fall.Pt provided with initial HEP and is motivated to practice the exercises for optimal improvements. Pt will continue to benefit from skilled physical therapy intervention to address impairments, improve QOL, and attain therapy goals.   OBJECTIVE IMPAIRMENTS: Abnormal gait, decreased balance, decreased mobility, difficulty walking, decreased strength, dizziness, increased edema, impaired sensation, impaired UE functional use, improper body mechanics, postural dysfunction, and pain.   ACTIVITY LIMITATIONS: carrying, lifting, bending, standing, squatting, stairs, transfers, dressing, and locomotion level  PARTICIPATION LIMITATIONS: meal prep, cleaning, laundry, driving, shopping, community activity, and yard work  PERSONAL FACTORS: Age, Sex, and 3+ comorbidities:  HTN, DVT, esophageal stricture, OSA, depression, please see above for extensive PMH  are also affecting patient's functional  outcome.   REHAB POTENTIAL: Good  CLINICAL DECISION MAKING: Evolving/moderate complexity  EVALUATION COMPLEXITY: Moderate  PLAN:  PT FREQUENCY: 1-2x/week  PT DURATION: 12 weeks  PLANNED INTERVENTIONS: 97164- PT Re-evaluation, 97110-Therapeutic exercises, 97530- Therapeutic activity, 97112- Neuromuscular re-education, 97535- Self Care, [id number]- Manual therapy, [phone]- Gait training, [phone]- Orthotic Fit/training, [phone]- Canalith repositioning, Patient/Family education, Balance training, Stair training, Taping, Joint mobilization, Spinal mobilization, Vestibular training, DME instructions, Wheelchair mobility training, Cryotherapy, and Moist heat  PLAN FOR NEXT SESSION: Vestibular assessment  within constraints of her neck pain    Norman Herrlich, PT 08/06/2023, 8:27 AM

## 2023-08-08 NOTE — Therapy (Signed)
 OUTPATIENT OCCUPATIONAL THERAPY NEURO TREATMENT NOTE  Patient Name: [name] MRN: [id number] DOB:[birth year], 73 y.o., female Today's Date: 08/08/2023  PCP: Bethann Punches, MD REFERRING PROVIDER: Jeanella Craze, PA-C  END OF SESSION:  OT End of Session - 08/08/23 1640     Visit Number 2    Number of Visits 24    Date for OT Re-Evaluation 10/25/23    Authorization Type Progress reporting period starting 08/02/2023    OT Start Time 0930    OT Stop Time 1015    OT Time Calculation (min) 45 min    Activity Tolerance Patient tolerated treatment well    Behavior During Therapy WFL for tasks assessed/performed            Past Medical History:  Diagnosis Date   Bradycardia    Chest pain    Constipation    Depression    Diverticulitis    Diverticulosis of colon    DOE (dyspnea on exertion)    DVT (deep venous thrombosis) (HCC)    Esophageal stricture    Hyperlipemia    Hypertension    IBS (irritable bowel syndrome)    Sleep apnea    Past Surgical History:  Procedure Laterality Date   APPENDECTOMY     BLOOD CLOT  2004   RIGHT LEG   CHOLECYSTECTOMY     COLON SURGERY     CRANIOTOMY Left 06/25/2023   Procedure: CRANIOTOMY HEMATOMA EVACUATION SUBDURAL;  Surgeon: Venetia Night, MD;  Location: [facility] ORS;  Service: Neurosurgery;  Laterality: Left;   GALLBLADDER SURGERY  2003   IR GASTROSTOMY TUBE MOD SED  07/05/2023   NISSEN FUNDOPLICATION     PLANTAR FASCIA SURGERY  2007   sigmoid colectomy  10/21/2010   pelvic anastomosis   VAGINAL HYSTERECTOMY     Patient Active Problem List   Diagnosis Date Noted   Seizure (HCC) 07/03/2023   Acute cystitis without hematuria 07/03/2023   Subdural hematoma (HCC) 06/25/2023   Midline shift of brain with brain compression (HCC) 06/25/2023   Glasgow coma scale total score 9-12 06/25/2023   Acute metabolic encephalopathy 03/01/2022   Anxiety and depression 03/01/2022   Dyslipidemia 03/01/2022   GERD without esophagitis  03/01/2022   SIRS (systemic inflammatory response syndrome) (HCC) 02/28/2022   Gastroenteritis due to norovirus    Nausea vomiting and diarrhea    Hypomagnesemia    Hypokalemia    Hyperlipidemia    Gastroesophageal reflux disease without esophagitis    Intractable vomiting 08/08/2020   Esophagitis on CT 08/08/2020   SVT (supraventricular tachycardia) (HCC) 03/07/2019   Acute respiratory distress 11/16/2017   Acute respiratory failure (HCC) 11/16/2017   Screening for lipid disorders 07/28/2016   DOE (dyspnea on exertion) 07/02/2016   Bradycardia 07/02/2016   Anxiety 07/02/2016   Anxiety state    Atypical chest pain 03/08/2015   Fever 08/12/2010   ABDOMINAL PAIN, LEFT LOWER QUADRANT 06/23/2010   DIVERTICULITIS, ACUTE 02/25/2010   CONSTIPATION 01/22/2010   IRRITABLE BOWEL SYNDROME 01/22/2010   Primary hypertension 01/21/2010   HYPERTENSION, CONTROLLED 01/21/2010   DEEP VENOUS THROMBOPHLEBITIS 01/21/2010   ESOPHAGEAL STRICTURE 02/29/2004   Diverticulosis of colon 02/29/2004   ONSET DATE: 06/25/2023  REFERRING DIAG: Subdural Hematoma  THERAPY DIAG:  Muscle weakness (generalized)  Other lack of coordination  Subdural hematoma (HCC)  Rationale for Evaluation and Treatment: Rehabilitation  SUBJECTIVE:   SUBJECTIVE STATEMENT: Pt reports doing well today.   Pt accompanied by: daughter: Melissa  PERTINENT HISTORY: Pt. Is a 73 y.o female  who sustained a Subdural Hematoma after hitting the side of her head on a wall when reaching up into her closet. Pt. Is s/p left sided craniotomy. PMHx includes: Acute CVA, HTN, Anxiety, Depression, Dyslipidemia. Pt. Has a Hx of Right rotator cuff tear. Per MRI imaging: supraspinatus tendon tear, and biceps tendon tear. Pt. Has received a cortizone shot in the right shoulder this past week.   PRECAUTIONS: No driving  WEIGHT BEARING RESTRICTIONS: No  PAIN:  Are you having pain? 3/10 pain in R hand, neck, and R shoulder blade (from recent  cortisone injection last Friday)  FALLS: Has patient fallen in last 6 months? Yes. Number of falls 1-into the wall reaching into her closet  LIVING ENVIRONMENT: Lives with: lives alone Lives in: Mobile home Stairs: 5 steps to enter with bilateral rails Has following equipment at home: Walker - 4 wheeled and shower chair  PLOF: Independent  PATIENT GOALS: To be able to do what she wants to when she wants.  OBJECTIVE:  Note: Objective measures were completed at Evaluation unless otherwise noted.  HAND DOMINANCE: Right  ADLs:  Transfers/ambulation related to ADLs: Eating: Occasional spillage, independent using a fork for soft food. Difficulty spearing firm food, and difficulty cutting food. Difficulty holding a drink, and opening a bottle. Grooming: switches hand when opening toothpaste, uses left to brush teeth after trying with the right.  Independent with finger nails. Independent with hair care.  UB Dressing: Independent with shirt, bra. Difficulty buttoning at times, cuff buttons LB Dressing: Independent, slide on shoes Toileting: Independent Bathing:  Sink side baths, daughter assist with back Tub Shower transfers:  Has transfer tube bench, however is scared   IADLs: Shopping: Accompanied by daughter to [employer] housekeeping:  Daughter performs. Pt. Assisted with making the bed. (Daughter waiting for [place] to give the okay for Pt. 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  Uses rollator  for longer distance,  POSTURE COMMENTS:  No Significant postural limitations Sitting balance: intact  ACTIVITY TOLERANCE: Activity tolerance:  Fair  FUNCTIONAL OUTCOME MEASURES: 08/06/23: MAM-20 Neurological: 58/80  UPPER EXTREMITY ROM:    Active ROM Right eval Left Eval [facility] overall  Shoulder flexion 132(135)   Shoulder abduction 112(119)   Shoulder adduction    Shoulder extension    Shoulder internal rotation    Shoulder external rotation    Elbow flexion WFL   Elbow extension WFL   Wrist flexion    Wrist extension 30(42)   Wrist ulnar deviation    Wrist radial deviation    Wrist pronation    Wrist supination    (Blank rows = not tested)  Thumb opposition: Right: limited to the the tip of the 4th digit Left: WFL  UPPER EXTREMITY MMT:     MMT Right eval Left Eval 5/5 overall  Shoulder flexion 4-/5   Shoulder abduction 4-/5   Shoulder adduction    Shoulder extension    Shoulder internal rotation    Shoulder external rotation    Middle trapezius    Lower trapezius  Elbow flexion 4/5   Elbow extension 4/5   Wrist flexion    Wrist extension 4-/5   Wrist ulnar deviation    Wrist radial deviation    Wrist pronation    Wrist supination    (Blank rows = not tested)  HAND FUNCTION: Grip strength: Right: 5 lbs; Left: 40 lbs, Lateral pinch: Right: 10 lbs, Left: 16 lbs, and 3 point pinch: Right: 9 lbs, Left: 14 lbs  COORDINATION: 9 Hole Peg test: Right: 60 sec; Left: 23 sec  SENSATION: WFL Light touch: WFL Proprioception: WFL  Positive numbness, cold sensation in the right thumb, 3rd, and 4th digits.  EDEMA: N/A  MUSCLE TONE: Intact  COGNITION: Overall cognitive status: Within functional limits for tasks assessed  VISION: Subjective report:  Limited vision as Pt. was scheduled for bilateral cataract surgery. However due to the injury was unable to attend. No change in vision as a result of the injury  PERCEPTION: TBD  PRAXIS: Impaired:  Ideomotor   TREATMENT DATE: 08/06/2023  Self Care: Reviewed self care tasks/efficiency with RUE, formulated new goal; see below.  Completed MAM-20 (see above).  Therapeutic Activity: Issued pink theraputty and instructed pt in strengthening and coordination exercises for R hand, including gross grasping, lateral/2 point/3 point pinching, digit abd/add, and digging coins out of putty.  Able to return demo with intermittent vc for technique to improve quality of movement.  Encouraged completion 5-10 min, 1-2x per day.  Visual handout issued and reviewed with pt and daughter.  PATIENT EDUCATION: Education details: HEP Person educated: Patient and Child(ren) Education method: Explanation, Demonstration, and Verbal cues, visual handout Education comprehension: verbalized understanding, returned demonstration, verbal cues required, and needs further education  HOME EXERCISE PROGRAM: Pink theraputty  GOALS: Goals reviewed with patient? Yes  SHORT TERM GOALS: Target date: 09/13/2023  Pt. Will be independent with HEPs for the RUE, and hand. Baseline: Eval: No current HEP. Goal status: INITIAL  LONG TERM GOALS: Target date: 10/25/2023  Pt, will improve Right shoulder ROM by 10 degrees with assist rea up into cabinetry, and closets. Baseline: Eval: Right shoulder flexion: 132(135), abduction: 112(119) Goal status: INITIAL  2.  Pt. Will improve right grip strength by 5# to be able to hold a beverage securely. Baseline: Eval: R: 5#, L: 40#. Pt. Has difficulty securely holding a beverage Goal status: INITIAL  3.  Pt. Will improve right pinch strength by 3# to be able to open a bottle. Baseline: Eval: Lateral pinch: R: 10#, L: 16#, 3 pt. pinch: R: 9#, L: 14# Goal status: INITIAL  4.  Pt. Will improve right hand [facility] skills by 5 sec. To be able to efficiently manipulate small objects for ADL/IADLs Baseline: Eval: 9 hole peg test: R: 60 sec.; Left: 23 sec. Goal status: INITIAL  5.   Pt. Will  perform light home making tasks with Modified independence. Baseline: Eval: Daughter is completing home management tasks, and waiting the okay before Pt. Completes  Goal status: INITIAL  6.  Pt. Will use utensils with modified independence  Baseline: Eval: Pt. has difficulty spearing food with a fork, and cutting food.  Goal status: INITIAL  7.  Pt will increase MAM-20 score by 10 or more points to indicate self perceived improvement in functional use of R hand for daily tasks.  Baseline: 08/06/23: 58/80  Goal status: INITIAL  ASSESSMENT:  CLINICAL IMPRESSION: Pt reports some soreness to the R shoulder blade today following recent cortisone injection last Friday.  Pt tolerated all therapeutic exercises  and activities well this date.  Pt admitted little use of her putty since home from rehab, but agreeable to making a new commitment today to daily use after OT reviewed benefits of improving strength and coordination to R hand, carrying over to better engagement of the R hand with daily tasks.  Pt reports eagerness to return to independence.  Pt. will continue to benefit from OT services to work on improving RUE functioning in order to improve engagement in, and maximize independence with ADLs, and IADL tasks.   PERFORMANCE DEFICITS: in functional skills including ADLs, IADLs, coordination, dexterity, ROM, strength, Fine motor control, Gross motor control, decreased knowledge of use of DME, and UE functional use, cognitive skills including , and psychosocial skills including coping strategies, environmental adaptation, and routines and behaviors.   IMPAIRMENTS: are limiting patient from ADLs, IADLs, and leisure.   CO-MORBIDITIES: may have co-morbidities  that affects occupational performance. Patient will benefit from skilled OT to address above impairments and improve overall function.  MODIFICATION OR ASSISTANCE TO COMPLETE EVALUATION: Min-Moderate modification of tasks or assist with assess  necessary to complete an evaluation.  OT OCCUPATIONAL PROFILE AND HISTORY: Detailed assessment: Review of records and additional review of physical, cognitive, psychosocial history related to current functional performance.  CLINICAL DECISION MAKING: Moderate - several treatment options, min-mod task modification necessary  REHAB POTENTIAL: Good  EVALUATION COMPLEXITY: Moderate    PLAN:  OT FREQUENCY: 2x/week  OT DURATION: 12 weeks  PLANNED INTERVENTIONS: 97535 self care/ADL training, 40981 therapeutic exercise, 97530 therapeutic activity, 97112 neuromuscular re-education, 97140 manual therapy, 97018 paraffin, 19147 contrast bath, [id number] Cognitive training (first 15 min), 82956 Cognitive training(each additional 15 min), 21308 Orthotics management and training, 65784 Splinting (initial encounter), patient/family education, and DME and/or AE instructions  RECOMMENDED OTHER SERVICES:   PT, ST  CONSULTED AND AGREED WITH PLAN OF CARE: Patient and family member/caregiver: Melissa  PLAN FOR NEXT SESSION: Treatment  Danelle Earthly, MS, OTR/L   08/08/2023, 4:42 PM

## 2023-08-09 ENCOUNTER — Ambulatory Visit: Admitting: Physical Therapy

## 2023-08-09 ENCOUNTER — Ambulatory Visit: Admitting: Speech Pathology

## 2023-08-09 DIAGNOSIS — R200 Anesthesia of skin: Secondary | ICD-10-CM | POA: Diagnosis not present

## 2023-08-09 DIAGNOSIS — R262 Difficulty in walking, not elsewhere classified: Secondary | ICD-10-CM

## 2023-08-09 DIAGNOSIS — M6281 Muscle weakness (generalized): Secondary | ICD-10-CM

## 2023-08-09 DIAGNOSIS — S065XAA Traumatic subdural hemorrhage with loss of consciousness status unknown, initial encounter: Secondary | ICD-10-CM

## 2023-08-09 DIAGNOSIS — R482 Apraxia: Secondary | ICD-10-CM

## 2023-08-09 DIAGNOSIS — R4701 Aphasia: Secondary | ICD-10-CM

## 2023-08-09 DIAGNOSIS — R2681 Unsteadiness on feet: Secondary | ICD-10-CM

## 2023-08-09 DIAGNOSIS — R278 Other lack of coordination: Secondary | ICD-10-CM

## 2023-08-09 NOTE — Therapy (Signed)
 OUTPATIENT SPEECH LANGUAGE PATHOLOGY  APHASIA TREATMENT  Patient Name: [name] MRN: [id number] DOB:1950/11/07, 73 y.o., female Today's Date: 08/09/2023  PCP: Bethann Punches, MD REFERRING PROVIDER: Jeanella Craze, PA-C   End of Session - 08/09/23 [phone]     Visit Number 2    Number of Visits 25    Date for SLP Re-Evaluation 10/25/23    Authorization Type Medicare A/B    Progress Note Due on Visit 10    SLP Start Time 0815    SLP Stop Time  0845    SLP Time Calculation (min) 30 min    Activity Tolerance Patient tolerated treatment well             Past Medical History:  Diagnosis Date   Bradycardia    Chest pain    Constipation    Depression    Diverticulitis    Diverticulosis of colon    DOE (dyspnea on exertion)    DVT (deep venous thrombosis) (HCC)    Esophageal stricture    Hyperlipemia    Hypertension    IBS (irritable bowel syndrome)    Sleep apnea    Past Surgical History:  Procedure Laterality Date   APPENDECTOMY     BLOOD CLOT  2004   RIGHT LEG   CHOLECYSTECTOMY     COLON SURGERY     CRANIOTOMY Left 06/25/2023   Procedure: CRANIOTOMY HEMATOMA EVACUATION SUBDURAL;  Surgeon: Venetia Night, MD;  Location: [facility] ORS;  Service: Neurosurgery;  Laterality: Left;   GALLBLADDER SURGERY  2003   IR GASTROSTOMY TUBE MOD SED  07/05/2023   NISSEN FUNDOPLICATION     PLANTAR FASCIA SURGERY  2007   sigmoid colectomy  10/21/2010   pelvic anastomosis   VAGINAL HYSTERECTOMY     Patient Active Problem List   Diagnosis Date Noted   Seizure (HCC) 07/03/2023   Acute cystitis without hematuria 07/03/2023   Subdural hematoma (HCC) 06/25/2023   Midline shift of brain with brain compression (HCC) 06/25/2023   Glasgow coma scale total score 9-12 06/25/2023   Acute metabolic encephalopathy 03/01/2022   Anxiety and depression 03/01/2022   Dyslipidemia 03/01/2022   GERD without esophagitis 03/01/2022   SIRS (systemic inflammatory response syndrome) (HCC)  02/28/2022   Gastroenteritis due to norovirus    Nausea vomiting and diarrhea    Hypomagnesemia    Hypokalemia    Hyperlipidemia    Gastroesophageal reflux disease without esophagitis    Intractable vomiting 08/08/2020   Esophagitis on CT 08/08/2020   SVT (supraventricular tachycardia) (HCC) 03/07/2019   Acute respiratory distress 11/16/2017   Acute respiratory failure (HCC) 11/16/2017   Screening for lipid disorders 07/28/2016   DOE (dyspnea on exertion) 07/02/2016   Bradycardia 07/02/2016   Anxiety 07/02/2016   Anxiety state    Atypical chest pain 03/08/2015   Fever 08/12/2010   ABDOMINAL PAIN, LEFT LOWER QUADRANT 06/23/2010   DIVERTICULITIS, ACUTE 02/25/2010   CONSTIPATION 01/22/2010   IRRITABLE BOWEL SYNDROME 01/22/2010   Primary hypertension 01/21/2010   HYPERTENSION, CONTROLLED 01/21/2010   DEEP VENOUS THROMBOPHLEBITIS 01/21/2010   ESOPHAGEAL STRICTURE 02/29/2004   Diverticulosis of colon 02/29/2004    ONSET DATE: 06/25/2023; date of referral  07/22/2023  REFERRING DIAG: I63.9 (ICD-10-CM) - CVA (cerebral vascular accident) (HCC)   THERAPY DIAG:  Aphasia  Rationale for Evaluation and Treatment Rehabilitation  SUBJECTIVE:   PERTINENT HISTORY and DIAGNOSTIC FINDINGS: Pt is a 73 year old female with medical hx significant for: HTN, hyperlipidemia, IBS, anxiety/depression, diverticulitis, OSA, PAF, DVT on  Eliquis. Pt presented to [facility]23 with complaint of headaches. Pt had an unwitnessed fall 4 days prior to presentation.  CT revealed large left SDH and resulting midline shift. Pt had possible seizure episode in triage.  Pt underwent emergent left-sided craniotomy for evacuation of subdural hematoma by Dr. Myer Haff on 06/25/23. Repeat CT on 2/16 which showed no evidence of recurrent SDH or stroke. Neurology consulted after possible seizure noted on 06/28/23. MRI was negative for acute changes. EEG on 06/28/23 was suggestive of cortical  dysfunction arising from left temporal region likely secondary to SDH/cranioplasty. No seizures were seen. UA positive on 2/21. Started on IV ceftriaxone. PEG tube placed by IR on 07/05/23. Repeat CT was stable. 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 MRI of right shoulder showed rotator cuff tear. Pt with ST services during CIR admission 07/13/2023 thru 07/24/2023 when she discharged to her daughter's home.   PAIN:  Are you having pain? No  FALLS: Has patient fallen in last 6 months?  See PT evaluation for details  LIVING ENVIRONMENT: Lives with: lives with their daughter Lives in: House/apartment  PLOF:  Level of assistance: Independent with ADLs, Independent with IADLs Employment: Retired   PATIENT GOALS    To improve cognitive linguistic abilities  SUBJECTIVE STATEMENT: Pt pleasant, reports things are going well Pt accompanied by: family member (her daughter [name])   OBJECTIVE:  TODAY'S TREATMENT:   Skilled treatment session focused on pt's aphasia goals. SLP facilitated session by providing the following interventions:  Vocabulary building targeted thru category building and synonym generation. With rare Min A pt able to list members of semi-complex categories. Pt also able to generate 3 alternative words with same meaning.   PATIENT EDUCATION: Education details: results of this assessment, ST POC Person educated: Patient and Child(ren) Education method: Explanation Education comprehension: needs further education  HOME EXERCISE PROGRAM:   Completed worksheets targeting synonyms and higher level categories   GOALS:  Goals reviewed with patient? Yes  SHORT TERM GOALS: Target date: 10 sessions  With Supervision A, patient will complete a semantic feature analysis with at least 4 relevant features for 7/9 target words to improve word-finding skills.  Baseline: Goal status: INITIAL  2.  With supervision A, pt will generate at  least 10 items with complex abstract category in 7 out of 9 opportunities.  Baseline:  Goal status: INITIAL   LONG TERM GOALS: Target date: 10/27/2023  With Mod I, patient will participate in complex conversation at 95% accuracy to increase ability to communicate complex thoughts, feelings, and needs.  Baseline:  Goal status: INITIAL  2.  With Mod I, patient/family will demonstrate understanding of the following concepts: aphasia, spontaneous recovery, communication vs conversation, strengths/strategies to promote success, local resources by answering multiple choice questions with 90% accuracy when provided supported conversation in order to increase patient's participation in medical care.  Baseline:  Goal status: INITIAL   ASSESSMENT:  CLINICAL IMPRESSION: Patient is a 73 y.o. right handed female who was seen today for a speech language treatment d/t large left SDH. Pt presents with much improved verbal communication and currently presents with mild higher level language impairment c/b intermittent word finding deficits and semantic paraphasias.   Pt eager and responsive to therapeutic activities. See the above note for treatment details.   OBJECTIVE IMPAIRMENTS include expressive language, aphasia, and apraxia. These impairments are limiting patient from managing medications, managing appointments, managing finances, household responsibilities, ADLs/IADLs, and effectively communicating  at home and in community. Factors affecting potential to achieve goals and functional outcome are  N/A . Patient will benefit from skilled SLP services to address above impairments and improve overall function.  REHAB POTENTIAL: Good  PLAN: SLP FREQUENCY: 2x/week  SLP DURATION: 12 weeks  PLANNED INTERVENTIONS: Language facilitation, Cueing hierachy, Functional tasks, Multimodal communication approach, SLP instruction and feedback, Compensatory strategies, and Patient/family education    Chrisie Jankovich  B. Dreama Saa, M.S., CCC-SLP, Tree surgeon Certified Brain Injury Specialist [facility]  [facility] Rehabilitation Services Office [phone] Ascom [phone] Fax [phone]

## 2023-08-09 NOTE — Therapy (Signed)
 OUTPATIENT PHYSICAL THERAPY NEURO  TREATMENT   Patient Name: [name] MRN: [id number] DOB:[birth year], 73 y.o., female Today's Date: 08/09/2023   PCP: [name], MD REFERRING PROVIDER: Trey Paula, PA-C  END OF SESSION:  PT End of Session - 08/09/23 0815     Visit Number 3    Number of Visits 25    Date for PT Re-Evaluation 10/25/23    Progress Note Due on Visit 10    PT Start Time 0845    PT Stop Time 0920    PT Time Calculation (min) 35 min    Equipment Utilized During Treatment Gait belt    Activity Tolerance Patient tolerated treatment well    Behavior During Therapy WFL for tasks assessed/performed             Past Medical History:  Diagnosis Date   Bradycardia    Chest pain    Constipation    Depression    Diverticulitis    Diverticulosis of colon    DOE (dyspnea on exertion)    DVT (deep venous thrombosis) (HCC)    Esophageal stricture    Hyperlipemia    Hypertension    IBS (irritable bowel syndrome)    Sleep apnea    Past Surgical History:  Procedure Laterality Date   APPENDECTOMY     BLOOD CLOT  2004   RIGHT LEG   CHOLECYSTECTOMY     COLON SURGERY     CRANIOTOMY Left 06/25/2023   Procedure: CRANIOTOMY HEMATOMA EVACUATION SUBDURAL;  Surgeon: Venetia Night, MD;  Location: [facility] ORS;  Service: Neurosurgery;  Laterality: Left;   GALLBLADDER SURGERY  2003   IR GASTROSTOMY TUBE MOD SED  07/05/2023   NISSEN FUNDOPLICATION     PLANTAR FASCIA SURGERY  2007   sigmoid colectomy  10/21/2010   pelvic anastomosis   VAGINAL HYSTERECTOMY     Patient Active Problem List   Diagnosis Date Noted   Seizure (HCC) 07/03/2023   Acute cystitis without hematuria 07/03/2023   Subdural hematoma (HCC) 06/25/2023   Midline shift of brain with brain compression (HCC) 06/25/2023   Glasgow coma scale total score 9-12 06/25/2023   Acute metabolic encephalopathy 03/01/2022   Anxiety and depression 03/01/2022   Dyslipidemia 03/01/2022   GERD  without esophagitis 03/01/2022   SIRS (systemic inflammatory response syndrome) (HCC) 02/28/2022   Gastroenteritis due to norovirus    Nausea vomiting and diarrhea    Hypomagnesemia    Hypokalemia    Hyperlipidemia    Gastroesophageal reflux disease without esophagitis    Intractable vomiting 08/08/2020   Esophagitis on CT 08/08/2020   SVT (supraventricular tachycardia) (HCC) 03/07/2019   Acute respiratory distress 11/16/2017   Acute respiratory failure (HCC) 11/16/2017   Screening for lipid disorders 07/28/2016   DOE (dyspnea on exertion) 07/02/2016   Bradycardia 07/02/2016   Anxiety 07/02/2016   Anxiety state    Atypical chest pain 03/08/2015   Fever 08/12/2010   ABDOMINAL PAIN, LEFT LOWER QUADRANT 06/23/2010   DIVERTICULITIS, ACUTE 02/25/2010   CONSTIPATION 01/22/2010   IRRITABLE BOWEL SYNDROME 01/22/2010   Primary hypertension 01/21/2010   HYPERTENSION, CONTROLLED 01/21/2010   DEEP VENOUS THROMBOPHLEBITIS 01/21/2010   ESOPHAGEAL STRICTURE 02/29/2004   Diverticulosis of colon 02/29/2004    ONSET DATE: 06/21/2023  REFERRING DIAG:  Diagnosis  I63.9 (ICD-10-CM) - CVA (cerebral vascular accident) (HCC)    THERAPY DIAG:  Muscle weakness (generalized)  Other lack of coordination  Subdural hematoma (HCC)  Unsteadiness on feet  Difficulty in walking,  not elsewhere classified  Apraxia  Rationale for Evaluation and Treatment: Rehabilitation  SUBJECTIVE:                                                                                                                                                      SUBJECTIVE STATEMENT:  Pt reports no changes from last session. Was able to go to restaurant  [employer] without AD for 50% of the time. Only 1 LOB with curb management while entering restaurant, requiring UE support from daughter. No other updates   From Eval: Pt is a pleasant 73 y/o female referred to PT s/p SDH due to fall 06/21/2023. She reports she fell in her bathroom and  hit her head. She did not immediately seek care, but later developed a bad headache. She has no memory of an ambulance being called, but does remember waking up in the hospital. Pt found to have bilat subdural hematoma & reports hospital stay of around 31 days. She received inpatient PT/OT/Speech. She reports her balance is still off, was not using an AD prior to this but now uses a RW. She is not very confident in her balance. She reports no stumbles/falls since initial fall in bathroom. Pt mostly bathing on her own and dressing herself, although she does get some help from her daughter. Pt currently has a PEG tube, will be getting this removed soon. Other concerns consist of difficulty using RUE, difficulty opening things and buttoning clothes. She has RUE pain. Worst pain in past two weeks has been 10/10, lowest pain level in past two weeks has been 4/10. Pt also reports dizziness if she looks up or lays down, but states she has a long hx of vertigo of many years. Her neck can hurt when she turns her head.   Pt accompanied by: With DTR Melissa.  PERTINENT HISTORY:   Note via ED discharge:  "Brief HPI:  JOZEE HAMMER is a 73 y.o. female with history of HTN, DVT, esophageal stricture, OSA, depression; who was admitted on 06/25/2023. She with reports of fall 3 to 4 days prior to admission with headaches and mental status changes.  She had seizure type activity in ED and was found to have a large left cerebral SDH measuring 1.1 x 1.2 cm and rightward midline shift with early uncal herniation and mass effect on left lateral ventricular system.  Eliquis reversed, she was transfused with 1 units PRBC for anemia with hemoglobin of 6 and taken to the OR emergently for craniotomy for evacuation of hematoma.  Postop required Cardene drip for BP control and had issues with pain and agitation.  Repeat CT showed no significant change.  EEG was negative for seizures.  E. coli/Citrobacter UTI treated with 5-day course of  ceftriaxone she continued to have fluctuating mental status and and fevers with urine showing >  100,000 colonies of enterococcus felt to be contaminant.  Dr. Marcell Barlow questioned need for MMA embolization pending repeat CT head in 4 weeks.  PAF managed with addition of metoprolol added for rate control and MRI brain repeated on 02/26 revealing small acute cortical infarcts in left frontal and parietal lobe. She was started on IV heparin per neurology input and Apixaban resumed on 02/28."  Please refer to chart/above for full PMH  PAIN:  Are you having pain? No not currently, occ finger numbness (R side) and RUE pain, had recent cortisone shot and this helped  PRECAUTIONS: Fall and Other: pt currently has PEG tube  RED FLAGS: Is going to see speech for swallowing/has had esophagus expansion in past    WEIGHT BEARING RESTRICTIONS: No  FALLS: Has patient fallen in last 6 months? Yes. Number of falls 1  LIVING ENVIRONMENT: Lives with: lives with their daughter Lives in: House/apartment Stairs:  5 steps with bilat handrails Has following equipment at home: Walker - 4 wheeled and shower chair  PLOF: Independent  PATIENT GOALS: Goals get back to being independent on her own. Was not living with daughter until d/c from hospital  OBJECTIVE:  Note: Objective measures were completed at Evaluation unless otherwise noted.  DIAGNOSTIC FINDINGS:  via chart  CT HEAD 07/22/23: "IMPRESSION: Expected evolution with regression of subdural hematoma on the left. No new or progressive finding.     Electronically Signed   By: Tiburcio Pea M.D.   On: 07/22/2023 08:30  "  US carotid bilat 07/11/23 "IMPRESSION: No significant stenosis of internal carotid arteries.     Electronically Signed   By: Acquanetta Belling M.D.   On: 07/11/2023 16:30"  MR brain 07/07/23: "IMPRESSION: 1. Small acute cortical infarcts within the left frontal and parietal lobes (deep to the cranioplasty). 2. Subdural  hematomas overlying the bilateral cerebral hemispheres have not significantly changed in extent. However, new from the prior brain MRI of 06/27/2023, there is apparent restricted diffusion within these collections. This may simply reflect susceptibility artifact from blood products. However, restricted diffusion can also be seen in subdural empyemas and empyemas cannot be excluded by imaging. 4 mm rightward midline shift, unchanged.     Electronically Signed   By: Jackey Loge D.O.   On: 07/07/2023 21:58"  COGNITION: Overall cognitive status: Within functional limits for tasks assessed   SENSATION: Pt reports impaired sensation RUE (dominant side) and impaired sensation in her feet occ  COORDINATION: WFL rapid alt LE, WFL heel>shin bilaterally WFL rapid alt UE bilaterally   EDEMA:  Reports she does get some swelling occ around her stomach, reports hx of digestive issues/celiac, current PEG tube, hx gallbladder surgery and other intestinal surgery?     POSTURE: rounded shoulders   LOWER EXTREMITY MMT:   Grossly 4+/5 bilat LE most deficits   RUE MMT:  BUE grossly 4 to 4+/5 - comparabale each side & no pain with testing  Grip strength 4+/5 bilat UE   TRANSFERS: Assistive device utilized: Environmental consultant - 4 wheeled  Sit to stand: SBA Stand to sit: SBA  GAIT: Gait pattern:  impaired gait speed, CGA provided or use of 4WW, observed decrease in trunk rotation Distance walked: clinic distances, 10 MWT Assistive device utilized: Walker - 4 wheeled and no AD but with close CGA  FUNCTIONAL TESTS:  5 times sit to stand: 13 sec hands-free :  0.96 m/s no AD CGA  decrease in trunk rotation SLB: <5 sec bilat LE  Tandem stance: within 15-25  sec, BLE extremely tremulous, use of hip strategy   BERG: not assessed  DGI: not assessed     PATIENT SURVEYS:  ABC scale 47.5%                                                                                                                               TREATMENT DATE: 08/09/23  Nustep BLE/BUE reciprocal movement training x 6 min level 1-3.   Gait without AD x 468ft. No veering R or L, no LOB.  Forward single limb step over bolster x 15 bil  Lateral single limb step over bolster x 15 bil  Reciprocal foot tap on 6 inch step without UE support  Step up 6 inch with BUE support x 8 bil.  Forward Gait across unlevel surface of red mat and airex beam x 8 with CGA/min assist from for safety.  Side stepping cross unlevel surface of red mat and airex beam x4 bil with CGA/min assist from for safety and to reduce compensation and force side step.   Throughout session, PT provided CGA for safety unless otherwise noted. No reports of dizziness from pt throughout dynamic balance training.     PATIENT EDUCATION: Education details: assessment findings, plan, goals, prognosis Person educated: Patient and adult daughter Education method: Explanation Education comprehension: verbalized understanding  HOME EXERCISE PROGRAM: Access Code: [email or web address] URL: [email or web address] Date: 08/06/2023 Prepared by: Thresa Ross  Exercises - Standing Single Leg Stance with Counter Support  - 1 x daily - 7 x weekly - 2 sets - 30 sec  hold - Sit to Stand with Arms Crossed  - 1 x daily - 7 x weekly - 2 sets - 10 reps - Narrow Stance with Head Nods and Counter Support  - 1 x daily - 7 x weekly - 2 sets - 30 sec  hold  GOALS: Goals reviewed with patient? Yes   SHORT TERM GOALS: Target date: 09/13/2023   Patient will be independent in home exercise program to improve strength/mobility for better functional independence with ADLs. Baseline: Goal status: INITIAL   LONG TERM GOALS: Target date: 10/25/2023   Patient will increase ABC scale score >80% to demonstrate better functional mobility and better confidence with ADLs.   Baseline: 47.5% Goal status: INITIAL  2.  Patient will complete five times sit to stand test in <  10 seconds indicating an increased LE strength and improved balance. Baseline: 13 sec hands-free Goal status: INITIAL  3.  Patient will increase Berg Balance score by > 6 points to demonstrate decreased fall risk during functional activities Baseline: 47 Goal status: INITIAL  4.  Patient will increase 10 meter walk test to >1.98m/s as to improve gait speed for better community ambulation and to reduce fall risk. Baseline: 0.96 m/s Goal status: INITIAL  5.  Patient will increase dynamic gait index score to >19/24 as to demonstrate reduced fall risk and improved dynamic gait balance for better safety with community/home ambulation.  Baseline: 19 Goal status: INITIAL   ASSESSMENT:  CLINICAL IMPRESSION:  Patient is a pleasant 73 y.o. female who was seen today for physical therapy treatment for SDH s/p fall with ED admission 06/25/2023. PT treatment focused on dynamic balance training on unlevel surface and increased step width/length with good carryover from one activity to the next. No s/s of vertigo throughout session with high level balance training. Will continue to monitor as appropriate. Pt will continue to benefit from skilled physical therapy intervention to address impairments, improve QOL, and attain therapy goals.   OBJECTIVE IMPAIRMENTS: Abnormal gait, decreased balance, decreased mobility, difficulty walking, decreased strength, dizziness, increased edema, impaired sensation, impaired UE functional use, improper body mechanics, postural dysfunction, and pain.   ACTIVITY LIMITATIONS: carrying, lifting, bending, standing, squatting, stairs, transfers, dressing, and locomotion level  PARTICIPATION LIMITATIONS: meal prep, cleaning, laundry, driving, shopping, community activity, and yard work  PERSONAL FACTORS: Age, Sex, and 3+ comorbidities:  HTN, DVT, esophageal stricture, OSA, depression, please see above for extensive PMH  are also affecting patient's functional outcome.    REHAB POTENTIAL: Good  CLINICAL DECISION MAKING: Evolving/moderate complexity  EVALUATION COMPLEXITY: Moderate  PLAN:  PT FREQUENCY: 1-2x/week  PT DURATION: 12 weeks  PLANNED INTERVENTIONS: 97164- PT Re-evaluation, 97110-Therapeutic exercises, 97530- Therapeutic activity, 97112- Neuromuscular re-education, 97535- Self Care, 30865- Manual therapy, [phone]- Gait training, [phone]- Orthotic Fit/training, [phone]- Canalith repositioning, Patient/Family education, Balance training, Stair training, Taping, Joint mobilization, Spinal mobilization, Vestibular training, DME instructions, Wheelchair mobility training, Cryotherapy, and Moist heat  PLAN FOR NEXT SESSION:   Continue high level balance training.  Vestibular assessment  within constraints of her neck pain as appropriate    Golden Pop, PT 08/09/2023, 10:24 AM

## 2023-08-11 ENCOUNTER — Ambulatory Visit

## 2023-08-11 ENCOUNTER — Ambulatory Visit: Admitting: Speech Pathology

## 2023-08-11 ENCOUNTER — Encounter: Admitting: Occupational Therapy

## 2023-08-11 ENCOUNTER — Ambulatory Visit: Admitting: Occupational Therapy

## 2023-08-11 ENCOUNTER — Ambulatory Visit: Attending: Obstetrics and Gynecology

## 2023-08-11 ENCOUNTER — Encounter: Admitting: Speech Pathology

## 2023-08-11 DIAGNOSIS — R4701 Aphasia: Secondary | ICD-10-CM

## 2023-08-11 DIAGNOSIS — R41841 Cognitive communication deficit: Secondary | ICD-10-CM | POA: Insufficient documentation

## 2023-08-11 DIAGNOSIS — R278 Other lack of coordination: Secondary | ICD-10-CM | POA: Diagnosis present

## 2023-08-11 DIAGNOSIS — R200 Anesthesia of skin: Secondary | ICD-10-CM | POA: Diagnosis present

## 2023-08-11 DIAGNOSIS — R262 Difficulty in walking, not elsewhere classified: Secondary | ICD-10-CM | POA: Insufficient documentation

## 2023-08-11 DIAGNOSIS — M6281 Muscle weakness (generalized): Secondary | ICD-10-CM | POA: Diagnosis present

## 2023-08-11 DIAGNOSIS — R2681 Unsteadiness on feet: Secondary | ICD-10-CM | POA: Insufficient documentation

## 2023-08-11 DIAGNOSIS — S065XAA Traumatic subdural hemorrhage with loss of consciousness status unknown, initial encounter: Secondary | ICD-10-CM | POA: Insufficient documentation

## 2023-08-11 DIAGNOSIS — R482 Apraxia: Secondary | ICD-10-CM | POA: Insufficient documentation

## 2023-08-11 NOTE — Therapy (Signed)
 OUTPATIENT SPEECH LANGUAGE PATHOLOGY  APHASIA TREATMENT  Patient Name: [name] MRN: [id number] DOB:11/14/1950, 73 y.o., female Today's Date: 08/11/2023  PCP: Bethann Punches, MD REFERRING PROVIDER: Jeanella Craze, PA-C   End of Session - 08/11/23 1731     Visit Number 3    Number of Visits 25    Date for SLP Re-Evaluation 10/25/23    Authorization Type Medicare A/B    Progress Note Due on Visit 10    SLP Start Time 1530    SLP Stop Time  1615    SLP Time Calculation (min) 45 min    Activity Tolerance Patient tolerated treatment well             Past Medical History:  Diagnosis Date   Bradycardia    Chest pain    Constipation    Depression    Diverticulitis    Diverticulosis of colon    DOE (dyspnea on exertion)    DVT (deep venous thrombosis) (HCC)    Esophageal stricture    Hyperlipemia    Hypertension    IBS (irritable bowel syndrome)    Sleep apnea    Past Surgical History:  Procedure Laterality Date   APPENDECTOMY     BLOOD CLOT  2004   RIGHT LEG   CHOLECYSTECTOMY     COLON SURGERY     CRANIOTOMY Left 06/25/2023   Procedure: CRANIOTOMY HEMATOMA EVACUATION SUBDURAL;  Surgeon: Venetia Night, MD;  Location: [facility] ORS;  Service: Neurosurgery;  Laterality: Left;   GALLBLADDER SURGERY  2003   IR GASTROSTOMY TUBE MOD SED  07/05/2023   NISSEN FUNDOPLICATION     PLANTAR FASCIA SURGERY  2007   sigmoid colectomy  10/21/2010   pelvic anastomosis   VAGINAL HYSTERECTOMY     Patient Active Problem List   Diagnosis Date Noted   Seizure (HCC) 07/03/2023   Acute cystitis without hematuria 07/03/2023   Subdural hematoma (HCC) 06/25/2023   Midline shift of brain with brain compression (HCC) 06/25/2023   Glasgow coma scale total score 9-12 06/25/2023   Acute metabolic encephalopathy 03/01/2022   Anxiety and depression 03/01/2022   Dyslipidemia 03/01/2022   GERD without esophagitis 03/01/2022   SIRS (systemic inflammatory response syndrome) (HCC)  02/28/2022   Gastroenteritis due to norovirus    Nausea vomiting and diarrhea    Hypomagnesemia    Hypokalemia    Hyperlipidemia    Gastroesophageal reflux disease without esophagitis    Intractable vomiting 08/08/2020   Esophagitis on CT 08/08/2020   SVT (supraventricular tachycardia) (HCC) 03/07/2019   Acute respiratory distress 11/16/2017   Acute respiratory failure (HCC) 11/16/2017   Screening for lipid disorders 07/28/2016   DOE (dyspnea on exertion) 07/02/2016   Bradycardia 07/02/2016   Anxiety 07/02/2016   Anxiety state    Atypical chest pain 03/08/2015   Fever 08/12/2010   ABDOMINAL PAIN, LEFT LOWER QUADRANT 06/23/2010   DIVERTICULITIS, ACUTE 02/25/2010   CONSTIPATION 01/22/2010   IRRITABLE BOWEL SYNDROME 01/22/2010   Primary hypertension 01/21/2010   HYPERTENSION, CONTROLLED 01/21/2010   DEEP VENOUS THROMBOPHLEBITIS 01/21/2010   ESOPHAGEAL STRICTURE 02/29/2004   Diverticulosis of colon 02/29/2004    ONSET DATE: 06/25/2023; date of referral  07/22/2023  REFERRING DIAG: I63.9 (ICD-10-CM) - CVA (cerebral vascular accident) (HCC)   THERAPY DIAG:  Aphasia  Rationale for Evaluation and Treatment Rehabilitation  SUBJECTIVE:   PERTINENT HISTORY and DIAGNOSTIC FINDINGS: Pt is a 73 year old female with medical hx significant for: HTN, hyperlipidemia, IBS, anxiety/depression, diverticulitis, OSA, PAF, DVT on  Eliquis. Pt [facility] with complaint of headaches. Pt had an unwitnessed fall 4 days prior to presentation.  CT revealed large left SDH and resulting midline shift. Pt had possible seizure episode in triage.  Pt underwent emergent left-sided craniotomy for evacuation of subdural hematoma by Dr. Myer Haff on 06/25/23. Repeat CT on 2/16 which showed no evidence of recurrent SDH or stroke. Neurology consulted after possible seizure noted on 06/28/23. MRI was negative for acute changes. EEG on 06/28/23 was suggestive of cortical  dysfunction arising from left temporal region likely secondary to SDH/cranioplasty. No seizures were seen. UA positive on 2/21. Started on IV ceftriaxone. PEG tube placed by IR on 07/05/23. Repeat CT was stable. 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 MRI of right shoulder showed rotator cuff tear. Pt with ST services during CIR admission 07/13/2023 thru 07/24/2023 when she discharged to her daughter's home.   PAIN:  Are you having pain? No  FALLS: Has patient fallen in last 6 months?  See PT evaluation for details  LIVING ENVIRONMENT: Lives with: lives with their daughter Lives in: House/apartment  PLOF:  Level of assistance: Independent with ADLs, Independent with IADLs Employment: Retired   PATIENT GOALS    To improve cognitive linguistic abilities  SUBJECTIVE STATEMENT: Pt pleasant, brought in completed HEP Pt accompanied by: family member (her daughter [name])   OBJECTIVE:  TODAY'S TREATMENT:   Skilled treatment session focused on pt's aphasia goals. SLP facilitated session by providing the following interventions:  Pt brought in completed homework targeting vocabulary building pt provided high level synonyms for words that were listed.   Pt benefited from intermittent Min A to complete semi-complex verbal description tasks with semantic constraints.   PATIENT EDUCATION: Education details: word finding strategies. Person educated: Patient and Child(ren) Education method: Explanation Education comprehension: needs further education  HOME EXERCISE PROGRAM:   Completed worksheets targeting synonyms and higher level categories   GOALS:  Goals reviewed with patient? Yes  SHORT TERM GOALS: Target date: 10 sessions  With Supervision A, patient will complete a semantic feature analysis with at least 4 relevant features for 7/9 target words to improve word-finding skills.  Baseline: Goal status: INITIAL  2.  With supervision A, pt  will generate at least 10 items with complex abstract category in 7 out of 9 opportunities.  Baseline:  Goal status: INITIAL   LONG TERM GOALS: Target date: 10/27/2023  With Mod I, patient will participate in complex conversation at 95% accuracy to increase ability to communicate complex thoughts, feelings, and needs.  Baseline:  Goal status: INITIAL  2.  With Mod I, patient/family will demonstrate understanding of the following concepts: aphasia, spontaneous recovery, communication vs conversation, strengths/strategies to promote success, local resources by answering multiple choice questions with 90% accuracy when provided supported conversation in order to increase patient's participation in medical care.  Baseline:  Goal status: INITIAL   ASSESSMENT:  CLINICAL IMPRESSION: Patient is a 73 y.o. right handed female who was seen today for a speech language treatment d/t large left SDH. Pt presents with much improved verbal communication and currently presents with mild higher level language impairment c/b intermittent word finding deficits and semantic paraphasias.   Pt eager and responsive to therapeutic activities and as a result she continues to experience expressive language growth.  See the above note for treatment details.   OBJECTIVE IMPAIRMENTS include expressive language, aphasia, and apraxia. These impairments are limiting patient from managing  medications, managing appointments, managing finances, household responsibilities, ADLs/IADLs, and effectively communicating at home and in community. Factors affecting potential to achieve goals and functional outcome are  N/A . Patient will benefit from skilled SLP services to address above impairments and improve overall function.  REHAB POTENTIAL: Good  PLAN: SLP FREQUENCY: 2x/week  SLP DURATION: 12 weeks  PLANNED INTERVENTIONS: Language facilitation, Cueing hierachy, Functional tasks, Multimodal communication approach, SLP  instruction and feedback, Compensatory strategies, and Patient/family education    Loucinda Croy B. Dreama Saa, M.S., CCC-SLP, Tree surgeon Certified Brain Injury Specialist [facility]  [facility] Office [phone] Ascom [phone] Fax [phone]

## 2023-08-11 NOTE — Therapy (Signed)
 OUTPATIENT PHYSICAL THERAPY NEURO  TREATMENT   Patient Name: [name] MRN: [id number] DOB:1950/07/13, 73 y.o., female Today's Date: 08/12/2023   PCP: [name], MD REFERRING PROVIDER: Trey Paula, PA-C  END OF SESSION:  PT End of Session - 08/11/23 1357     Visit Number 4    Number of Visits 25    Date for PT Re-Evaluation 10/25/23    Progress Note Due on Visit 10    PT Start Time 1358    PT Stop Time 1442    PT Time Calculation (min) 44 min    Equipment Utilized During Treatment Gait belt    Activity Tolerance Patient tolerated treatment well    Behavior During Therapy WFL for tasks assessed/performed              Past Medical History:  Diagnosis Date   Bradycardia    Chest pain    Constipation    Depression    Diverticulitis    Diverticulosis of colon    DOE (dyspnea on exertion)    DVT (deep venous thrombosis) (HCC)    Esophageal stricture    Hyperlipemia    Hypertension    IBS (irritable bowel syndrome)    Sleep apnea    Past Surgical History:  Procedure Laterality Date   APPENDECTOMY     BLOOD CLOT  2004   RIGHT LEG   CHOLECYSTECTOMY     COLON SURGERY     CRANIOTOMY Left 06/25/2023   Procedure: CRANIOTOMY HEMATOMA EVACUATION SUBDURAL;  Surgeon: Venetia Night, MD;  Location: [facility] ORS;  Service: Neurosurgery;  Laterality: Left;   GALLBLADDER SURGERY  2003   IR GASTROSTOMY TUBE MOD SED  07/05/2023   NISSEN FUNDOPLICATION     PLANTAR FASCIA SURGERY  2007   sigmoid colectomy  10/21/2010   pelvic anastomosis   VAGINAL HYSTERECTOMY     Patient Active Problem List   Diagnosis Date Noted   Seizure (HCC) 07/03/2023   Acute cystitis without hematuria 07/03/2023   Subdural hematoma (HCC) 06/25/2023   Midline shift of brain with brain compression (HCC) 06/25/2023   Glasgow coma scale total score 9-12 06/25/2023   Acute metabolic encephalopathy 03/01/2022   Anxiety and depression 03/01/2022   Dyslipidemia 03/01/2022   GERD  without esophagitis 03/01/2022   SIRS (systemic inflammatory response syndrome) (HCC) 02/28/2022   Gastroenteritis due to norovirus    Nausea vomiting and diarrhea    Hypomagnesemia    Hypokalemia    Hyperlipidemia    Gastroesophageal reflux disease without esophagitis    Intractable vomiting 08/08/2020   Esophagitis on CT 08/08/2020   SVT (supraventricular tachycardia) (HCC) 03/07/2019   Acute respiratory distress 11/16/2017   Acute respiratory failure (HCC) 11/16/2017   Screening for lipid disorders 07/28/2016   DOE (dyspnea on exertion) 07/02/2016   Bradycardia 07/02/2016   Anxiety 07/02/2016   Anxiety state    Atypical chest pain 03/08/2015   Fever 08/12/2010   ABDOMINAL PAIN, LEFT LOWER QUADRANT 06/23/2010   DIVERTICULITIS, ACUTE 02/25/2010   CONSTIPATION 01/22/2010   IRRITABLE BOWEL SYNDROME 01/22/2010   Primary hypertension 01/21/2010   HYPERTENSION, CONTROLLED 01/21/2010   DEEP VENOUS THROMBOPHLEBITIS 01/21/2010   ESOPHAGEAL STRICTURE 02/29/2004   Diverticulosis of colon 02/29/2004    ONSET DATE: 06/21/2023  REFERRING DIAG:  Diagnosis  I63.9 (ICD-10-CM) - CVA (cerebral vascular accident) (HCC)    THERAPY DIAG:  Muscle weakness (generalized)  Other lack of coordination  Unsteadiness on feet  Difficulty in walking, not elsewhere classified  Subdural hematoma (HCC)  Rationale for Evaluation and Treatment: Rehabilitation  SUBJECTIVE:                                                                                                                                                      SUBJECTIVE STATEMENT:  Pt reports doing more and more at home. Denies any pain or falls.   From Eval: Pt is a pleasant 73 y/o female referred to PT s/p SDH due to fall 06/21/2023. She reports she fell in her bathroom and hit her head. She did not immediately seek care, but later developed a bad headache. She has no memory of an ambulance being called, but does remember waking up in  the hospital. Pt found to have bilat subdural hematoma & reports hospital stay of around 31 days. She received inpatient PT/OT/Speech. She reports her balance is still off, was not using an AD prior to this but now uses a RW. She is not very confident in her balance. She reports no stumbles/falls since initial fall in bathroom. Pt mostly bathing on her own and dressing herself, although she does get some help from her daughter. Pt currently has a PEG tube, will be getting this removed soon. Other concerns consist of difficulty using RUE, difficulty opening things and buttoning clothes. She has RUE pain. Worst pain in past two weeks has been 10/10, lowest pain level in past two weeks has been 4/10. Pt also reports dizziness if she looks up or lays down, but states she has a long hx of vertigo of many years. Her neck can hurt when she turns her head.   Pt accompanied by: With DTR Melissa.  PERTINENT HISTORY:   Note via ED discharge:  "Brief HPI:  [name] is a 73 y.o. female with history of HTN, DVT, esophageal stricture, OSA, depression; who was admitted on 06/25/2023. She with reports of fall 3 to 4 days prior to admission with headaches and mental status changes.  She had seizure type activity in ED and was found to have a large left cerebral SDH measuring 1.1 x 1.2 cm and rightward midline shift with early uncal herniation and mass effect on left lateral ventricular system.  Eliquis reversed, she was transfused with 1 units PRBC for anemia with hemoglobin of 6 and taken to the OR emergently for craniotomy for evacuation of hematoma.  Postop required Cardene drip for BP control and had issues with pain and agitation.  Repeat CT showed no significant change.  EEG was negative for seizures.  E. coli/Citrobacter UTI treated with 5-day course of ceftriaxone she continued to have fluctuating mental status and and fevers with urine showing > 100,000 colonies of enterococcus felt to be contaminant.  Dr.  Marcell Barlow questioned need for MMA embolization pending repeat CT head in 4 weeks.  PAF managed with  addition of metoprolol added for rate control and MRI brain repeated on 02/26 revealing small acute cortical infarcts in left frontal and parietal lobe. She was started on IV heparin per neurology input and Apixaban resumed on 02/28."  Please refer to chart/above for full PMH  PAIN:  Are you having pain? No not currently, occ finger numbness (R side) and RUE pain, had recent cortisone shot and this helped  PRECAUTIONS: Fall and Other: pt currently has PEG tube  RED FLAGS: Is going to see speech for swallowing/has had esophagus expansion in past    WEIGHT BEARING RESTRICTIONS: No  FALLS: Has patient fallen in last 6 months? Yes. Number of falls 1  LIVING ENVIRONMENT: Lives with: lives with their daughter Lives in: House/apartment Stairs:  5 steps with bilat handrails Has following equipment at home: Walker - 4 wheeled and shower chair  PLOF: Independent  PATIENT GOALS: Goals get back to being independent on her own. Was not living with daughter until d/c from hospital  OBJECTIVE:  Note: Objective measures were completed at Evaluation unless otherwise noted.  DIAGNOSTIC FINDINGS:  via chart  CT HEAD 07/22/23: "IMPRESSION: Expected evolution with regression of subdural hematoma on the left. No new or progressive finding.     Electronically Signed   By: Tiburcio Pea M.D.   On: 07/22/2023 08:30  "  US carotid bilat 07/11/23 "IMPRESSION: No significant stenosis of internal carotid arteries.     Electronically Signed   By: Acquanetta Belling M.D.   On: 07/11/2023 16:30"  MR brain 07/07/23: "IMPRESSION: 1. Small acute cortical infarcts within the left frontal and parietal lobes (deep to the cranioplasty). 2. Subdural hematomas overlying the bilateral cerebral hemispheres have not significantly changed in extent. However, new from the prior brain MRI of 06/27/2023, there is  apparent restricted diffusion within these collections. This may simply reflect susceptibility artifact from blood products. However, restricted diffusion can also be seen in subdural empyemas and empyemas cannot be excluded by imaging. 4 mm rightward midline shift, unchanged.     Electronically Signed   By: Jackey Loge D.O.   On: 07/07/2023 21:58"  COGNITION: Overall cognitive status: Within functional limits for tasks assessed   SENSATION: Pt reports impaired sensation RUE (dominant side) and impaired sensation in her feet occ  COORDINATION: WFL rapid alt LE, WFL heel>shin bilaterally WFL rapid alt UE bilaterally   EDEMA:  Reports she does get some swelling occ around her stomach, reports hx of digestive issues/celiac, current PEG tube, hx gallbladder surgery and other intestinal surgery?     POSTURE: rounded shoulders   LOWER EXTREMITY MMT:   Grossly 4+/5 bilat LE most deficits   RUE MMT:  BUE grossly 4 to 4+/5 - comparabale each side & no pain with testing  Grip strength 4+/5 bilat UE   TRANSFERS: Assistive device utilized: Environmental consultant - 4 wheeled  Sit to stand: SBA Stand to sit: SBA  GAIT: Gait pattern:  impaired gait speed, CGA provided or use of 4WW, observed decrease in trunk rotation Distance walked: clinic distances, 10 MWT Assistive device utilized: Walker - 4 wheeled and no AD but with close CGA  FUNCTIONAL TESTS:  5 times sit to stand: 13 sec hands-free :  0.96 m/s no AD CGA  decrease in trunk rotation SLB: <5 sec bilat LE  Tandem stance: within 15-25 sec, BLE extremely tremulous, use of hip strategy   BERG: not assessed  DGI: not assessed     PATIENT SURVEYS:  ABC scale 47.5%  TREATMENT DATE: 08/12/23  NMR:   Step tap onto 6" block x 20 alt LE- (initial UE support- progressed to no UE Support) Step up onto 6"  block x 20 alt LE (1UE support) Static stand (back foot on airex pad and front foot on 6" step) x 30 sec then 10 reps with horizontal and vertical head motions- 10 ea direction.   Gait without AD x 415ft. No veering R or L, no LOB.  Forward single limb step over bolster x 15 bil  Lateral single limb step over bolster x 15 bil   Dynamic march on airex pad (no UE Support) x 20 Reps (VC to perform slowly)   Side stepping over 2 (1/2 foam rolls) with 3 blazepods against mirror- random setting- tapping appropriate pod x 6 trials (hit range from 6 to 11 hits in 30 sec)  Therapeutic activities:  Sit to stand without UE support x 15 reps  Throughout session, PT provided CGA for safety unless otherwise noted. No reports of dizziness from pt throughout dynamic balance training.     PATIENT EDUCATION: Education details: assessment findings, plan, goals, prognosis Person educated: Patient and adult daughter Education method: Explanation Education comprehension: verbalized understanding  HOME EXERCISE PROGRAM: Access Code: [email or web address] URL: [email or web address] Date: 08/06/2023 Prepared by: Thresa Ross  Exercises - Standing Single Leg Stance with Counter Support  - 1 x daily - 7 x weekly - 2 sets - 30 sec  hold - Sit to Stand with Arms Crossed  - 1 x daily - 7 x weekly - 2 sets - 10 reps - Narrow Stance with Head Nods and Counter Support  - 1 x daily - 7 x weekly - 2 sets - 30 sec  hold  GOALS: Goals reviewed with patient? Yes   SHORT TERM GOALS: Target date: 09/13/2023   Patient will be independent in home exercise program to improve strength/mobility for better functional independence with ADLs. Baseline: Goal status: INITIAL   LONG TERM GOALS: Target date: 10/25/2023   Patient will increase ABC scale score >80% to demonstrate better functional mobility and better confidence with ADLs.   Baseline: 47.5% Goal status: INITIAL  2.  Patient will complete five times  sit to stand test in < 10 seconds indicating an increased LE strength and improved balance. Baseline: 13 sec hands-free Goal status: INITIAL  3.  Patient will increase Berg Balance score by > 6 points to demonstrate decreased fall risk during functional activities Baseline: 47 Goal status: INITIAL  4.  Patient will increase 10 meter walk test to >1.12m/s as to improve gait speed for better community ambulation and to reduce fall risk. Baseline: 0.96 m/s Goal status: INITIAL  5.  Patient will increase dynamic gait index score to >19/24 as to demonstrate reduced fall risk and improved dynamic gait balance for better safety with community/home ambulation.   Baseline: 19 Goal status: INITIAL   ASSESSMENT:  CLINICAL IMPRESSION:  Patient is a pleasant 73 y.o. female who was seen today for physical therapy treatment for SDH s/p fall with ED admission 06/25/2023. PT treatment focused on dynamic balance training including some single leg weight bearing activities involving coordination and single leg balance. She did demonstrate some in session progress  - initially unsteady or lacking confidence in her ability but improved with practice.  Pt will continue to benefit from skilled physical therapy intervention to address impairments, improve QOL, and attain therapy goals.   OBJECTIVE IMPAIRMENTS: Abnormal gait, decreased balance, decreased mobility, difficulty walking, decreased strength,  dizziness, increased edema, impaired sensation, impaired UE functional use, improper body mechanics, postural dysfunction, and pain.   ACTIVITY LIMITATIONS: carrying, lifting, bending, standing, squatting, stairs, transfers, dressing, and locomotion level  PARTICIPATION LIMITATIONS: meal prep, cleaning, laundry, driving, shopping, community activity, and yard work  PERSONAL FACTORS: Age, Sex, and 3+ comorbidities:  HTN, DVT, esophageal stricture, OSA, depression, please see above for extensive PMH  are also  affecting patient's functional outcome.   REHAB POTENTIAL: Good  CLINICAL DECISION MAKING: Evolving/moderate complexity  EVALUATION COMPLEXITY: Moderate  PLAN:  PT FREQUENCY: 1-2x/week  PT DURATION: 12 weeks  PLANNED INTERVENTIONS: 97164- PT Re-evaluation, 97110-Therapeutic exercises, 97530- Therapeutic activity, 97112- Neuromuscular re-education, 97535- Self Care, 09811- Manual therapy, [phone]- Gait training, [phone]- Orthotic Fit/training, [phone]- Canalith repositioning, Patient/Family education, Balance training, Stair training, Taping, Joint mobilization, Spinal mobilization, Vestibular training, DME instructions, Wheelchair mobility training, Cryotherapy, and Moist heat  PLAN FOR NEXT SESSION:   Continue high level balance training.    Lenda Kelp, PT 08/12/2023, 1:26 PM

## 2023-08-12 ENCOUNTER — Emergency Department

## 2023-08-12 ENCOUNTER — Telehealth: Payer: Self-pay

## 2023-08-12 ENCOUNTER — Encounter: Payer: Self-pay | Admitting: Emergency Medicine

## 2023-08-12 ENCOUNTER — Other Ambulatory Visit: Payer: Self-pay

## 2023-08-12 ENCOUNTER — Emergency Department
Admission: EM | Admit: 2023-08-12 | Discharge: 2023-08-12 | Disposition: A | Discharge status: Home / Self Care | Source: Home / Self Care | Attending: Emergency Medicine | Admitting: Emergency Medicine

## 2023-08-12 ENCOUNTER — Emergency Department
Admission: EM | Admit: 2023-08-12 | Discharge: 2023-08-12 | Discharge status: Against Medical Advice | Attending: Emergency Medicine | Admitting: Emergency Medicine

## 2023-08-12 DIAGNOSIS — R42 Dizziness and giddiness: Secondary | ICD-10-CM | POA: Insufficient documentation

## 2023-08-12 DIAGNOSIS — S0990XA Unspecified injury of head, initial encounter: Secondary | ICD-10-CM | POA: Insufficient documentation

## 2023-08-12 DIAGNOSIS — Z7901 Long term (current) use of anticoagulants: Secondary | ICD-10-CM | POA: Insufficient documentation

## 2023-08-12 DIAGNOSIS — R519 Headache, unspecified: Secondary | ICD-10-CM | POA: Diagnosis present

## 2023-08-12 DIAGNOSIS — I4891 Unspecified atrial fibrillation: Secondary | ICD-10-CM | POA: Diagnosis not present

## 2023-08-12 DIAGNOSIS — W2209XA Striking against other stationary object, initial encounter: Secondary | ICD-10-CM | POA: Diagnosis not present

## 2023-08-12 DIAGNOSIS — W228XXA Striking against or struck by other objects, initial encounter: Secondary | ICD-10-CM | POA: Insufficient documentation

## 2023-08-12 DIAGNOSIS — Z5321 Procedure and treatment not carried out due to patient leaving prior to being seen by health care provider: Secondary | ICD-10-CM | POA: Diagnosis not present

## 2023-08-12 NOTE — Telephone Encounter (Signed)
 [name] contacted our office. She reports an awful pain and buzzing in her head in her right ear, behind her ear, and above her ear. It started an hour ago. She has taken 2 extra strength tylenols about 45 minutes ago without any relief. She currently rates the pain 5/10.   She denies vision changes or nausea/vomiting. She does feel like she doesn't have much of an appetite. She last ate a pop-tart this morning. She denies a sore throat or feeling of any drainage down into her throat.  She denies any recent falls. However, she informed me that she hit the back of her head on her daughters car yesterday when getting out of the car. Her pain is on the same side as where she hit her head, but maybe a little further down.   She had surgery on 06/25/23: a left craniotomy for evacuation of hematoma.  She takes eliquis 5mg  twice/day.   She is home alone at the time of our call. After discussion with Drake Leach, PA-C, I have advised her to proceed to the ER for evaluation. I have explained that this is necessary because she is on a blood thinner and has a recent head injury with a headache and has now had a significant change. She agreed to proceed to the ER. I explained the time sensitive nature of this. She plans to call her daughter immediately and if her daughter cannot come home to take her, she will call EMS.

## 2023-08-12 NOTE — ED Provider Notes (Signed)
 [facility] Provider Note    Event Date/Time   First MD Initiated Contact with Patient 08/12/23 2059     (approximate)   History   Head Injury   HPI  KURSTYN LARIOS is a 73 year old female with history of subdural hematoma s/p craniotomy, A-fib on Eliquis presenting to the emergency department for evaluation following a head injury.  Yesterday, patient bent down and excellently hit her head on the door frame when getting up.  No LOC.  Woke up with a generalized headache without associated numbness, tingling, focal weakness.  No fevers or chills.  No nausea or vomiting.  Presents for evaluation of setting of her anticoagulation use.    Physical Exam   Triage Vital Signs: ED Triage Vitals  Encounter Vitals Group     BP 08/12/23 1928 (!) 169/74     Systolic BP Percentile --      Diastolic BP Percentile --      Pulse Rate 08/12/23 1928 69     Resp 08/12/23 1928 16     Temp 08/12/23 1928 98.3 F (36.8 C)     Temp src --      SpO2 08/12/23 1928 98 %     Weight 08/12/23 1927 137 lb (62.1 kg)     Height 08/12/23 1927 5\' 1"  (1.549 m)     Head Circumference --      Peak Flow --      Pain Score 08/12/23 1926 5     Pain Loc --      Pain Education --      Exclude from Growth Chart --     Most recent vital signs: Vitals:   08/12/23 2100 08/12/23 2105  BP: (!) 183/72   Pulse: 65 67  Resp:    Temp:    SpO2: 95% 98%     General: Awake, interactive  Head:  No visible external trauma CV:  Regular rate, good peripheral perfusion.  Resp:  Unlabored respirations, lungs clear to auscultation Abd:  Nondistended, soft, nontender to palpation Neuro:  Alert and oriented, normal extraocular movements, symmetric facial movement, sensation intact over bilateral upper and lower extremities with 5 out of 5 strength.  Normal finger-to-nose testing.   ED Results / Procedures / Treatments   Labs (all labs ordered are listed, but only abnormal results are  displayed) Labs Reviewed - No data to display   EKG EKG independently reviewed interpreted by myself (ER attending) demonstrates:    RADIOLOGY Imaging independently reviewed and interpreted by myself demonstrates:  CT head with stable chronic subdural hematoma, but no acute bleed CT C-spine without acute fracture  PROCEDURES:  Critical Care performed: No  Procedures   MEDICATIONS ORDERED IN ED: Medications - No data to display   IMPRESSION / MDM / ASSESSMENT AND PLAN / ED COURSE  I reviewed the triage vital signs and the nursing notes.  Differential diagnosis includes, but is not limited to, intracranial bleed, skull fracture, spine fracture, concussion, no evidence of thoracoabdominal trauma  Patient's presentation is most consistent with acute presentation with potential threat to life or bodily function.  73 year old female presenting with headache after head trauma on anticoagulation.  Stable vitals on presentation.  CT imaging demonstrates stable chronic bleed, but fortunately without evidence of acute bleed.  Discussed possibility of concussion and discussed report of care measures.  Patient is comfortable with discharge home.  Strict return precautions provided.  Patient discharged in stable condition.      FINAL  CLINICAL IMPRESSION(S) / ED DIAGNOSES   Final diagnoses:  Closed head injury, initial encounter     Rx / DC Orders   ED Discharge Orders     None        Note:  This document was prepared using Dragon voice recognition software and may include unintentional dictation errors.   Trinna Post, MD 08/12/23 2150

## 2023-08-12 NOTE — Therapy (Signed)
 OUTPATIENT OCCUPATIONAL THERAPY NEURO TREATMENT NOTE  Patient Name: [name] MRN: [id number] DOB:[birth year], 73 y.o., female Today's Date: 08/12/2023  PCP: Bethann Punches, MD REFERRING PROVIDER: Jeanella Craze, PA-C  END OF SESSION:  OT End of Session - 08/12/23 0820     Visit Number 3    Number of Visits 24    Date for OT Re-Evaluation 10/25/23    Authorization Type Progress reporting period starting 08/02/2023    OT Start Time 1445    OT Stop Time 1530    OT Time Calculation (min) 45 min    Activity Tolerance Patient tolerated treatment well    Behavior During Therapy WFL for tasks assessed/performed            Past Medical History:  Diagnosis Date   Bradycardia    Chest pain    Constipation    Depression    Diverticulitis    Diverticulosis of colon    DOE (dyspnea on exertion)    DVT (deep venous thrombosis) (HCC)    Esophageal stricture    Hyperlipemia    Hypertension    IBS (irritable bowel syndrome)    Sleep apnea    Past Surgical History:  Procedure Laterality Date   APPENDECTOMY     BLOOD CLOT  2004   RIGHT LEG   CHOLECYSTECTOMY     COLON SURGERY     CRANIOTOMY Left 06/25/2023   Procedure: CRANIOTOMY HEMATOMA EVACUATION SUBDURAL;  Surgeon: Venetia Night, MD;  Location: [facility] ORS;  Service: Neurosurgery;  Laterality: Left;   GALLBLADDER SURGERY  2003   IR GASTROSTOMY TUBE MOD SED  07/05/2023   NISSEN FUNDOPLICATION     PLANTAR FASCIA SURGERY  2007   sigmoid colectomy  10/21/2010   pelvic anastomosis   VAGINAL HYSTERECTOMY     Patient Active Problem List   Diagnosis Date Noted   Seizure (HCC) 07/03/2023   Acute cystitis without hematuria 07/03/2023   Subdural hematoma (HCC) 06/25/2023   Midline shift of brain with brain compression (HCC) 06/25/2023   Glasgow coma scale total score 9-12 06/25/2023   Acute metabolic encephalopathy 03/01/2022   Anxiety and depression 03/01/2022   Dyslipidemia 03/01/2022   GERD without esophagitis  03/01/2022   SIRS (systemic inflammatory response syndrome) (HCC) 02/28/2022   Gastroenteritis due to norovirus    Nausea vomiting and diarrhea    Hypomagnesemia    Hypokalemia    Hyperlipidemia    Gastroesophageal reflux disease without esophagitis    Intractable vomiting 08/08/2020   Esophagitis on CT 08/08/2020   SVT (supraventricular tachycardia) (HCC) 03/07/2019   Acute respiratory distress 11/16/2017   Acute respiratory failure (HCC) 11/16/2017   Screening for lipid disorders 07/28/2016   DOE (dyspnea on exertion) 07/02/2016   Bradycardia 07/02/2016   Anxiety 07/02/2016   Anxiety state    Atypical chest pain 03/08/2015   Fever 08/12/2010   ABDOMINAL PAIN, LEFT LOWER QUADRANT 06/23/2010   DIVERTICULITIS, ACUTE 02/25/2010   CONSTIPATION 01/22/2010   IRRITABLE BOWEL SYNDROME 01/22/2010   Primary hypertension 01/21/2010   HYPERTENSION, CONTROLLED 01/21/2010   DEEP VENOUS THROMBOPHLEBITIS 01/21/2010   ESOPHAGEAL STRICTURE 02/29/2004   Diverticulosis of colon 02/29/2004   ONSET DATE: 06/25/2023  REFERRING DIAG: Subdural Hematoma  THERAPY DIAG:  Muscle weakness (generalized)  Other lack of coordination  Rationale for Evaluation and Treatment: Rehabilitation  SUBJECTIVE:   SUBJECTIVE STATEMENT: Upon arrival Pt. reports having had 3/10 numbness, and tingling pain.   Pt accompanied by: daughter: Melissa  PERTINENT HISTORY: Pt. Is a 73  y.o female who sustained a Subdural Hematoma after hitting the side of her head on a wall when reaching up into her closet. Pt. Is s/p left sided craniotomy. PMHx includes: Acute CVA, HTN, Anxiety, Depression, Dyslipidemia. Pt. Has a Hx of Right rotator cuff tear. Per MRI imaging: supraspinatus tendon tear, and biceps tendon tear. Pt. Has received a cortizone shot in the right shoulder this past week.   PRECAUTIONS: No driving  WEIGHT BEARING RESTRICTIONS: No  PAIN:  Are you having pain? 3/10 pain in right hand, wrist numbness, and  tingling pain upon arrival. 5/10  numbness, and tingling hand pain after activity.   FALLS: Has patient fallen in last 6 months? Yes. Number of falls 1-into the wall reaching into her closet  LIVING ENVIRONMENT: Lives with: lives alone Lives in: Mobile home Stairs: 5 steps to enter with bilateral rails Has following equipment at home: Walker - 4 wheeled and shower chair  PLOF: Independent  PATIENT GOALS: To be able to do what she wants to when she wants.  OBJECTIVE:  Note: Objective measures were completed at Evaluation unless otherwise noted.  HAND DOMINANCE: Right  ADLs:  Transfers/ambulation related to ADLs: Eating: Occasional spillage, independent using a fork for soft food. Difficulty spearing firm food, and difficulty cutting food. Difficulty holding a drink, and opening a bottle. Grooming: switches hand when opening toothpaste, uses left to brush teeth after trying with the right.  Independent with finger nails. Independent with hair care.  UB Dressing: Independent with shirt, bra. Difficulty buttoning at times, cuff buttons LB Dressing: Independent, slide on shoes Toileting: Independent Bathing:  Sink side baths, daughter assist with back Tub Shower transfers:  Has transfer tube bench, however is scared   IADLs: Shopping: Accompanied by daughter to [employer] housekeeping:  Daughter performs. Pt. Assisted with making the bed. (Daughter waiting for [place] to give the okay for Pt. 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  Uses rollator for longer distance,  POSTURE COMMENTS:  No Significant postural limitations Sitting balance: intact  ACTIVITY TOLERANCE: Activity tolerance:  Fair  FUNCTIONAL OUTCOME MEASURES: 08/06/23: MAM-20 Neurological: 58/80  UPPER EXTREMITY ROM:    Active ROM Right eval Left Eval [place] overall  Shoulder flexion 132(135)   Shoulder abduction 112(119)   Shoulder adduction    Shoulder extension    Shoulder internal rotation    Shoulder external rotation    Elbow flexion WFL   Elbow extension WFL   Wrist flexion    Wrist extension 30(42)   Wrist ulnar deviation    Wrist radial deviation    Wrist pronation    Wrist supination    (Blank rows = not tested)  Thumb opposition: Right: limited to the the tip of the 4th digit Left: WFL  UPPER EXTREMITY MMT:     MMT Right eval Left Eval 5/5 overall  Shoulder flexion 4-/5   Shoulder abduction 4-/5   Shoulder adduction    Shoulder extension    Shoulder internal rotation    Shoulder external rotation    Middle trapezius  Lower trapezius    Elbow flexion 4/5   Elbow extension 4/5   Wrist flexion    Wrist extension 4-/5   Wrist ulnar deviation    Wrist radial deviation    Wrist pronation    Wrist supination    (Blank rows = not tested)  HAND FUNCTION: Grip strength: Right: 5 lbs; Left: 40 lbs, Lateral pinch: Right: 10 lbs, Left: 16 lbs, and 3 point pinch: Right: 9 lbs, Left: 14 lbs  COORDINATION: 9 Hole Peg test: Right: 60 sec; Left: 23 sec  SENSATION: WFL Light touch: WFL Proprioception: WFL  Positive numbness, cold sensation in the right thumb, 3rd, and 4th digits.  EDEMA: N/A  MUSCLE TONE: Intact  COGNITION: Overall cognitive status: Within functional limits for tasks assessed  VISION: Subjective report:  Limited vision as Pt. was scheduled for bilateral cataract surgery. However due to the injury was unable to attend. No change in vision as a result of the  injury  PERCEPTION: TBD  PRAXIS: Impaired: Ideomotor   TREATMENT DATE: 08/12/2023   Therapeutic Ex.:   -Progressive right gross grip strengthening with 11.9# of grip strength resistive force, adjusted to 6.6# of force after clearing the 1st half of the board. -Incorporated reaching in multiple planes to further challenge the task.    Therapeutic Activities:   -Right lateral, and 3pt. pinch strengthening using yellow, red, green, and blue, and black level resistive clips -Facilitated translatory movements moving clips from the lateral pinch position to the 3pt. pinch position in preparation for securely placing them on the dowel.    Neuromuscular re-education:   -Facilitated right hand FMC tasks using the Grooved pegboard, grasping and 1" grooved pegs from a horizontal position in the shallow dish, and transitioning them to a vertical position in preparation for placing them upright into the pegboard.  -Translatory movements were performed grasping the grooved pegs, and storing them in the palm of his hand.    PATIENT EDUCATION: Education details: Opportunities for engaging the right hand during activity at home. Person educated: Patient and Child(ren) Education method: Explanation, Demonstration, and Verbal cues, visual handout Education comprehension: verbalized understanding, returned demonstration, verbal cues required, and needs further education  HOME EXERCISE PROGRAM: Pink theraputty  GOALS: Goals reviewed with patient? Yes  SHORT TERM GOALS: Target date: 09/13/2023  Pt. Will be independent with HEPs for the RUE, and hand. Baseline: Eval: No current HEP. Goal status: INITIAL  LONG TERM GOALS: Target date: 10/25/2023  Pt, will improve Right shoulder ROM by 10 degrees with assist rea up into cabinetry, and closets. Baseline: Eval: Right shoulder flexion: 132(135), abduction: 112(119) Goal status: INITIAL  2.  Pt. Will improve right grip strength by 5# to be able to  hold a beverage securely. Baseline: Eval: R: 5#, L: 40#. Pt. Has difficulty securely holding a beverage Goal status: INITIAL  3.  Pt. Will improve right pinch strength by 3# to be able to open a bottle. Baseline: Eval: Lateral pinch: R: 10#, L: 16#, 3 pt. pinch: R: 9#, L: 14# Goal status: INITIAL  4.  Pt. Will improve right hand [facility] skills by 5 sec. To be able to efficiently manipulate small objects for ADL/IADLs Baseline: Eval: 9 hole peg test: R: 60 sec.; Left: 23 sec. Goal status: INITIAL  5.   Pt. Will perform light home making tasks with Modified independence. Baseline: Eval: Daughter is completing home management tasks, and waiting the okay before Pt. Completes  Goal status: INITIAL  6.  Pt. Will use  utensils with modified independence  Baseline: Eval: Pt. has difficulty spearing food with a fork, and cutting food.  Goal status: INITIAL  7.  Pt will increase MAM-20 score by 10 or more points to indicate self perceived improvement in functional use of R hand for daily tasks.  Baseline: 08/06/23: 58/80  Goal status: INITIAL  ASSESSMENT:  CLINICAL IMPRESSION:  Pt. Is making progress, and is now engaging in more tasks at home. Pt. is now folding laundry. Pt.'s daughter is still reluctant to have the Pt. doing dishes. Pt. dropped multiple jumbo pegs when attempting to set 3 or more pegs up at a time in the pegboard. Pt. required the grip strength force to be adjusted to for the 2nd half of the board. Pt. was able to grasp, and place the grooved pegs into the straight direction, however had difficulty twisting the pegs within the tips of her digits for the diagonal direction. Pt. presents with right numbness and tingling hand pain today.Pt. continues to benefit from OT services to work on improving RUE functioning in order to improve engagement in, and maximize independence with ADLs, and IADL tasks.   PERFORMANCE DEFICITS: in functional skills including ADLs, IADLs, coordination,  dexterity, ROM, strength, Fine motor control, Gross motor control, decreased knowledge of use of DME, and UE functional use, cognitive skills including , and psychosocial skills including coping strategies, environmental adaptation, and routines and behaviors.   IMPAIRMENTS: are limiting patient from ADLs, IADLs, and leisure.   CO-MORBIDITIES: may have co-morbidities  that affects occupational performance. Patient will benefit from skilled OT to address above impairments and improve overall function.  MODIFICATION OR ASSISTANCE TO COMPLETE EVALUATION: Min-Moderate modification of tasks or assist with assess necessary to complete an evaluation.  OT OCCUPATIONAL PROFILE AND HISTORY: Detailed assessment: Review of records and additional review of physical, cognitive, psychosocial history related to current functional performance.  CLINICAL DECISION MAKING: Moderate - several treatment options, min-mod task modification necessary  REHAB POTENTIAL: Good  EVALUATION COMPLEXITY: Moderate    PLAN:  OT FREQUENCY: 2x/week  OT DURATION: 12 weeks  PLANNED INTERVENTIONS: 97535 self care/ADL training, 16109 therapeutic exercise, 97530 therapeutic activity, 97112 neuromuscular re-education, 97140 manual therapy, 97018 paraffin, 60454 contrast bath, [id number] Cognitive training (first 15 min), 09811 Cognitive training(each additional 15 min), 91478 Orthotics management and training, 29562 Splinting (initial encounter), patient/family education, and DME and/or AE instructions  RECOMMENDED OTHER SERVICES:   PT, ST  CONSULTED AND AGREED WITH PLAN OF CARE: Patient and family member/caregiver: Melissa  PLAN FOR NEXT SESSION: Treatment  Olegario Messier, MS, OTR/L    08/12/2023, 8:26 AM

## 2023-08-12 NOTE — ED Notes (Signed)
 Daughter [name] not allowed back inside hospital during this visit. Due to disrespecting and being argumentative staff members.

## 2023-08-12 NOTE — ED Notes (Signed)
 Pt called for CT scan but no answer and not visualized in lobby

## 2023-08-12 NOTE — ED Triage Notes (Signed)
 Pt here for evaluation at the recommendation of her neurosurgeons office as she hit head on daughter's car yesterday and has had a headache for approx 2 hours that has not been relieved with tylenol. Reports headache that is throbbing into her ear.   Denies vision changes or weakness. Reports minor lightheadedness with positioning.   Recent craniotomy 06/25/23 with hematoma evacuation. Also reports seizure after surgery 06/25/23.

## 2023-08-12 NOTE — Telephone Encounter (Signed)
 Marland Kitchen

## 2023-08-12 NOTE — Telephone Encounter (Signed)
 I happen to still be at the office and see this call from the answering service. I called her and she stated she left the ER because she didn't want to wait and be around sick people. I educated her about our concern for a bleed due to her hitting her head and being on eliquis. I explained that this is an emergent concern that should not be handled with an outpatient scan. She is on her way back to the ER.

## 2023-08-12 NOTE — Discharge Instructions (Signed)
 You were seen in the ER for your head injury.  Your CT fortunately did not show any new injuries.  Follow-up with your primary care doctor and neurosurgery team for further evaluation.  Return to the ER for new or worsening symptoms.

## 2023-08-12 NOTE — ED Notes (Signed)
 ED Provider at bedside.

## 2023-08-12 NOTE — ED Triage Notes (Addendum)
 Pt reports she had brain surgery to remove 2 clots in her brain back on 2/14 this year. Pt takes eliquis daily. Pt states she hit the back of her head on the door frame getting out of the car. Pt reports she has a headache, denies any other symptoms. No neuro deficits at this time.

## 2023-08-13 ENCOUNTER — Telehealth: Payer: Self-pay | Admitting: Neurosurgery

## 2023-08-13 NOTE — Telephone Encounter (Signed)
 Please disregard the original message, [name] has a phone message from 08/12/23 about this and this was handled.

## 2023-08-13 NOTE — Telephone Encounter (Signed)
  Media Information   Document Information  FYI

## 2023-08-13 NOTE — Telephone Encounter (Signed)
 Please let her know the order for the Head CT is in. She can call scheduling to get this appt made. [phone]. I will also send a message to April the supervisor.

## 2023-08-16 ENCOUNTER — Ambulatory Visit

## 2023-08-16 ENCOUNTER — Ambulatory Visit: Admitting: Speech Pathology

## 2023-08-16 ENCOUNTER — Ambulatory Visit: Admitting: Occupational Therapy

## 2023-08-16 DIAGNOSIS — R4701 Aphasia: Secondary | ICD-10-CM

## 2023-08-16 DIAGNOSIS — R278 Other lack of coordination: Secondary | ICD-10-CM

## 2023-08-16 DIAGNOSIS — M6281 Muscle weakness (generalized): Secondary | ICD-10-CM

## 2023-08-16 DIAGNOSIS — R2681 Unsteadiness on feet: Secondary | ICD-10-CM

## 2023-08-16 NOTE — Therapy (Signed)
 OUTPATIENT PHYSICAL THERAPY NEURO  TREATMENT   Patient Name: [name] MRN: [id number] DOB:1950/06/23, 72 y.o., female Today's Date: 08/16/2023   PCP: [name], MD REFERRING PROVIDER: Trey Paula, PA-C  END OF SESSION:  PT End of Session - 08/16/23 1445     Visit Number 5    Number of Visits 25    Date for PT Re-Evaluation 10/25/23    Progress Note Due on Visit 10    PT Start Time 1449    PT Stop Time 1528    PT Time Calculation (min) 39 min    Equipment Utilized During Treatment Gait belt    Activity Tolerance Patient tolerated treatment well    Behavior During Therapy WFL for tasks assessed/performed              Past Medical History:  Diagnosis Date   Bradycardia    Chest pain    Constipation    Depression    Diverticulitis    Diverticulosis of colon    DOE (dyspnea on exertion)    DVT (deep venous thrombosis) (HCC)    Esophageal stricture    Hyperlipemia    Hypertension    IBS (irritable bowel syndrome)    Sleep apnea    Past Surgical History:  Procedure Laterality Date   APPENDECTOMY     BLOOD CLOT  2004   RIGHT LEG   CHOLECYSTECTOMY     COLON SURGERY     CRANIOTOMY Left 06/25/2023   Procedure: CRANIOTOMY HEMATOMA EVACUATION SUBDURAL;  Surgeon: Venetia Night, MD;  Location: [facility] ORS;  Service: Neurosurgery;  Laterality: Left;   GALLBLADDER SURGERY  2003   IR GASTROSTOMY TUBE MOD SED  07/05/2023   NISSEN FUNDOPLICATION     PLANTAR FASCIA SURGERY  2007   sigmoid colectomy  10/21/2010   pelvic anastomosis   VAGINAL HYSTERECTOMY     Patient Active Problem List   Diagnosis Date Noted   Seizure (HCC) 07/03/2023   Acute cystitis without hematuria 07/03/2023   Subdural hematoma (HCC) 06/25/2023   Midline shift of brain with brain compression (HCC) 06/25/2023   Glasgow coma scale total score 9-12 06/25/2023   Acute metabolic encephalopathy 03/01/2022   Anxiety and depression 03/01/2022   Dyslipidemia 03/01/2022   GERD  without esophagitis 03/01/2022   SIRS (systemic inflammatory response syndrome) (HCC) 02/28/2022   Gastroenteritis due to norovirus    Nausea vomiting and diarrhea    Hypomagnesemia    Hypokalemia    Hyperlipidemia    Gastroesophageal reflux disease without esophagitis    Intractable vomiting 08/08/2020   Esophagitis on CT 08/08/2020   SVT (supraventricular tachycardia) (HCC) 03/07/2019   Acute respiratory distress 11/16/2017   Acute respiratory failure (HCC) 11/16/2017   Screening for lipid disorders 07/28/2016   DOE (dyspnea on exertion) 07/02/2016   Bradycardia 07/02/2016   Anxiety 07/02/2016   Anxiety state    Atypical chest pain 03/08/2015   Fever 08/12/2010   ABDOMINAL PAIN, LEFT LOWER QUADRANT 06/23/2010   DIVERTICULITIS, ACUTE 02/25/2010   CONSTIPATION 01/22/2010   IRRITABLE BOWEL SYNDROME 01/22/2010   Primary hypertension 01/21/2010   HYPERTENSION, CONTROLLED 01/21/2010   DEEP VENOUS THROMBOPHLEBITIS 01/21/2010   ESOPHAGEAL STRICTURE 02/29/2004   Diverticulosis of colon 02/29/2004    ONSET DATE: 06/21/2023  REFERRING DIAG:  Diagnosis  I63.9 (ICD-10-CM) - CVA (cerebral vascular accident) (HCC)    THERAPY DIAG:  Unsteadiness on feet  Muscle weakness (generalized)  Other lack of coordination  Rationale for Evaluation and Treatment: Rehabilitation  SUBJECTIVE:                                                                                                                                                      SUBJECTIVE STATEMENT:  Since last seen pt accidentally hit her head on the doorframe of her car, no LOC. Her head was hurting after that, was advised to go to the ED & went on 08/12/2023.  Everything was found to be OK/no new injury. No stumbles/falls. Pt has been ambulating without an AD. Just has some R hand pain but this is not new.   Pt accompanied by: With DTR Melissa.  PERTINENT HISTORY:    From Eval: Pt is a pleasant 73 y/o female referred to PT s/p  SDH due to fall 06/21/2023. She reports she fell in her bathroom and hit her head. She did not immediately seek care, but later developed a bad headache. She has no memory of an ambulance being called, but does remember waking up in the hospital. Pt found to have bilat subdural hematoma & reports hospital stay of around 31 days. She received inpatient PT/OT/Speech. She reports her balance is still off, was not using an AD prior to this but now uses a RW. She is not very confident in her balance. She reports no stumbles/falls since initial fall in bathroom. Pt mostly bathing on her own and dressing herself, although she does get some help from her daughter. Pt currently has a PEG tube, will be getting this removed soon. Other concerns consist of difficulty using RUE, difficulty opening things and buttoning clothes. She has RUE pain. Worst pain in past two weeks has been 10/10, lowest pain level in past two weeks has been 4/10. Pt also reports dizziness if she looks up or lays down, but states she has a long hx of vertigo of many years. Her neck can hurt when she turns her head.    Note via ED discharge:  "Brief HPI:  EMINE Barrera is a 73 y.o. female with history of HTN, DVT, esophageal stricture, OSA, depression; who was admitted on 06/25/2023. She with reports of fall 3 to 4 days prior to admission with headaches and mental status changes.  She had seizure type activity in ED and was found to have a large left cerebral SDH measuring 1.1 x 1.2 cm and rightward midline shift with early uncal herniation and mass effect on left lateral ventricular system.  Eliquis reversed, she was transfused with 1 units PRBC for anemia with hemoglobin of 6 and taken to the OR emergently for craniotomy for evacuation of hematoma.  Postop required Cardene drip for BP control and had issues with pain and agitation.  Repeat CT showed no significant change.  EEG was negative for seizures.  E. coli/Citrobacter UTI treated with 5-day  course of ceftriaxone she  continued to have fluctuating mental status and and fevers with urine showing > 100,000 colonies of enterococcus felt to be contaminant.  Dr. Marcell Barlow questioned need for MMA embolization pending repeat CT head in 4 weeks.  PAF managed with addition of metoprolol added for rate control and MRI brain repeated on 02/26 revealing small acute cortical infarcts in left frontal and parietal lobe. She was started on IV heparin per neurology input and Apixaban resumed on 02/28."  Please refer to chart/above for full PMH  PAIN:  Are you having pain? No not currently, occ finger numbness (R side) and RUE pain, had recent cortisone shot and this helped  PRECAUTIONS: Fall and Other: pt currently has PEG tube  RED FLAGS: Is going to see speech for swallowing/has had esophagus expansion in past    WEIGHT BEARING RESTRICTIONS: No  FALLS: Has patient fallen in last 6 months? Yes. Number of falls 1  LIVING ENVIRONMENT: Lives with: lives with their daughter Lives in: House/apartment Stairs:  5 steps with bilat handrails Has following equipment at home: Walker - 4 wheeled and shower chair  PLOF: Independent  PATIENT GOALS: Goals get back to being independent on her own. Was not living with daughter until d/c from hospital  OBJECTIVE:  Note: Objective measures were completed at Evaluation unless otherwise noted.  DIAGNOSTIC FINDINGS:  via chart  CT HEAD 07/22/23: "IMPRESSION: Expected evolution with regression of subdural hematoma on the left. No new or progressive finding.     Electronically Signed   By: Tiburcio Pea M.D.   On: 07/22/2023 08:30  "  US carotid bilat 07/11/23 "IMPRESSION: No significant stenosis of internal carotid arteries.     Electronically Signed   By: Acquanetta Belling M.D.   On: 07/11/2023 16:30"  MR brain 07/07/23: "IMPRESSION: 1. Small acute cortical infarcts within the left frontal and parietal lobes (deep to the cranioplasty). 2.  Subdural hematomas overlying the bilateral cerebral hemispheres have not significantly changed in extent. However, new from the prior brain MRI of 06/27/2023, there is apparent restricted diffusion within these collections. This may simply reflect susceptibility artifact from blood products. However, restricted diffusion can also be seen in subdural empyemas and empyemas cannot be excluded by imaging. 4 mm rightward midline shift, unchanged.     Electronically Signed   By: Jackey Loge D.O.   On: 07/07/2023 21:58"  COGNITION: Overall cognitive status: Within functional limits for tasks assessed   SENSATION: Pt reports impaired sensation RUE (dominant side) and impaired sensation in her feet occ  COORDINATION: WFL rapid alt LE, WFL heel>shin bilaterally WFL rapid alt UE bilaterally   EDEMA:  Reports she does get some swelling occ around her stomach, reports hx of digestive issues/celiac, current PEG tube, hx gallbladder surgery and other intestinal surgery?     POSTURE: rounded shoulders   LOWER EXTREMITY MMT:   Grossly 4+/5 bilat LE most deficits   RUE MMT:  BUE grossly 4 to 4+/5 - comparabale each side & no pain with testing  Grip strength 4+/5 bilat UE   TRANSFERS: Assistive device utilized: Environmental consultant - 4 wheeled  Sit to stand: SBA Stand to sit: SBA  GAIT: Gait pattern:  impaired gait speed, CGA provided or use of 4WW, observed decrease in trunk rotation Distance walked: clinic distances, 10 MWT Assistive device utilized: Walker - 4 wheeled and no AD but with close CGA  FUNCTIONAL TESTS:  5 times sit to stand: 13 sec hands-free :  0.96 m/s no AD CGA  decrease  in trunk rotation SLB: <5 sec bilat LE  Tandem stance: within 15-25 sec, BLE extremely tremulous, use of hip strategy   BERG: not assessed  DGI: not assessed     PATIENT SURVEYS:  ABC scale 47.5%                                                                                                                               TREATMENT DATE: 08/16/23  Throughout session, PT provided CGA for safety unless otherwise noted.   Therapeutic activities:   Nustep BLE/BUE reciprocal movement training x 5 min level 1-4  Sit to stand without UE support x 15 reps  Fast gait over 10 m without AD able to ambulate 1.1 m/s - no unsteadiness   NMR:   Obstacle course - narrow path walking around and through multiple  cones - no difficulty ---Pt progressed to performing with dual cog task x multiple reps, still no difficulty  Obstacle course thhrough cones with cone taps x multiple reps and multiple errors/knocking over cones  RLE cone tap x multiple reps   One foot on step, one on floor x 30 sec each LE   On airex: in 30 sec bouts  -NBOS EO -NBOS EC -NBOS EO with attempt of vertical & horizontal head turns - reps limited by dizziness (chronic issue) Tandem stance 2x30 sec each LE Slow march x 20       PATIENT EDUCATION: Education details: exercise technique Person educated: Patient and   Education method: Explanation Education comprehension: verbalized understanding  HOME EXERCISE PROGRAM: Access Code: [email or web address] URL: [email or web address] Date: 08/06/2023 Prepared by: Thresa Ross  Exercises - Standing Single Leg Stance with Counter Support  - 1 x daily - 7 x weekly - 2 sets - 30 sec  hold - Sit to Stand with Arms Crossed  - 1 x daily - 7 x weekly - 2 sets - 10 reps - Narrow Stance with Head Nods and Counter Support  - 1 x daily - 7 x weekly - 2 sets - 30 sec  hold  GOALS: Goals reviewed with patient? Yes   SHORT TERM GOALS: Target date: 09/13/2023   Patient will be independent in home exercise program to improve strength/mobility for better functional independence with ADLs. Baseline: Goal status: INITIAL   LONG TERM GOALS: Target date: 10/25/2023   Patient will increase ABC scale score >80% to demonstrate better functional mobility and better  confidence with ADLs.   Baseline: 47.5% Goal status: INITIAL  2.  Patient will complete five times sit to stand test in < 10 seconds indicating an increased LE strength and improved balance. Baseline: 13 sec hands-free Goal status: INITIAL  3.  Patient will increase Berg Balance score by > 6 points to demonstrate decreased fall risk during functional activities Baseline: 47 Goal status: INITIAL  4.  Patient will increase 10 meter walk test to >1.58m/s as to improve gait speed for better community ambulation and  to reduce fall risk. Baseline: 0.96 m/s Goal status: INITIAL  5.  Patient will increase dynamic gait index score to >19/24 as to demonstrate reduced fall risk and improved dynamic gait balance for better safety with community/home ambulation.   Baseline: 19 Goal status: INITIAL   ASSESSMENT:  CLINICAL IMPRESSION: Pt making gains AEB ambulating consistently without AD without LOB. Pt found to have most difficulty with SLB tasks in session, particularly cone tap obstacle course. Will continue to address this deficit.  Pt will continue to benefit from skilled physical therapy intervention to address impairments, improve QOL, and attain therapy goals.   OBJECTIVE IMPAIRMENTS: Abnormal gait, decreased balance, decreased mobility, difficulty walking, decreased strength, dizziness, increased edema, impaired sensation, impaired UE functional use, improper body mechanics, postural dysfunction, and pain.   ACTIVITY LIMITATIONS: carrying, lifting, bending, standing, squatting, stairs, transfers, dressing, and locomotion level  PARTICIPATION LIMITATIONS: meal prep, cleaning, laundry, driving, shopping, community activity, and yard work  PERSONAL FACTORS: Age, Sex, and 3+ comorbidities:  HTN, DVT, esophageal stricture, OSA, depression, please see above for extensive PMH  are also affecting patient's functional outcome.   REHAB POTENTIAL: Good  CLINICAL DECISION MAKING: Evolving/moderate  complexity  EVALUATION COMPLEXITY: Moderate  PLAN:  PT FREQUENCY: 1-2x/week  PT DURATION: 12 weeks  PLANNED INTERVENTIONS: 97164- PT Re-evaluation, 97110-Therapeutic exercises, 97530- Therapeutic activity, 97112- Neuromuscular re-education, 97535- Self Care, 40981- Manual therapy, [phone]- Gait training, [phone]- Orthotic Fit/training, [phone]- Canalith repositioning, Patient/Family education, Balance training, Stair training, Taping, Joint mobilization, Spinal mobilization, Vestibular training, DME instructions, Wheelchair mobility training, Cryotherapy, and Moist heat  PLAN FOR NEXT SESSION:   Continue high level balance training.    Baird Kay, PT 08/16/2023, 5:25 PM

## 2023-08-16 NOTE — Therapy (Signed)
 OUTPATIENT SPEECH LANGUAGE PATHOLOGY  APHASIA TREATMENT  Patient Name: [name] MRN: [id number] DOB:July 26, 1950, 73 y.o., female Today's Date: 08/16/2023  PCP: Bethann Punches, MD REFERRING PROVIDER: Jeanella Craze, PA-C   End of Session - 08/16/23 1532     Visit Number 4    Number of Visits 25    Date for SLP Re-Evaluation 10/25/23    Authorization Type Medicare A/B    Progress Note Due on Visit 10    SLP Start Time 1530    SLP Stop Time  1615    SLP Time Calculation (min) 45 min    Activity Tolerance Patient tolerated treatment well             Past Medical History:  Diagnosis Date   Bradycardia    Chest pain    Constipation    Depression    Diverticulitis    Diverticulosis of colon    DOE (dyspnea on exertion)    DVT (deep venous thrombosis) (HCC)    Esophageal stricture    Hyperlipemia    Hypertension    IBS (irritable bowel syndrome)    Sleep apnea    Past Surgical History:  Procedure Laterality Date   APPENDECTOMY     BLOOD CLOT  2004   RIGHT LEG   CHOLECYSTECTOMY     COLON SURGERY     CRANIOTOMY Left 06/25/2023   Procedure: CRANIOTOMY HEMATOMA EVACUATION SUBDURAL;  Surgeon: Venetia Night, MD;  Location: [facility] ORS;  Service: Neurosurgery;  Laterality: Left;   GALLBLADDER SURGERY  2003   IR GASTROSTOMY TUBE MOD SED  07/05/2023   NISSEN FUNDOPLICATION     PLANTAR FASCIA SURGERY  2007   sigmoid colectomy  10/21/2010   pelvic anastomosis   VAGINAL HYSTERECTOMY     Patient Active Problem List   Diagnosis Date Noted   Seizure (HCC) 07/03/2023   Acute cystitis without hematuria 07/03/2023   Subdural hematoma (HCC) 06/25/2023   Midline shift of brain with brain compression (HCC) 06/25/2023   Glasgow coma scale total score 9-12 06/25/2023   Acute metabolic encephalopathy 03/01/2022   Anxiety and depression 03/01/2022   Dyslipidemia 03/01/2022   GERD without esophagitis 03/01/2022   SIRS (systemic inflammatory response syndrome) (HCC)  02/28/2022   Gastroenteritis due to norovirus    Nausea vomiting and diarrhea    Hypomagnesemia    Hypokalemia    Hyperlipidemia    Gastroesophageal reflux disease without esophagitis    Intractable vomiting 08/08/2020   Esophagitis on CT 08/08/2020   SVT (supraventricular tachycardia) (HCC) 03/07/2019   Acute respiratory distress 11/16/2017   Acute respiratory failure (HCC) 11/16/2017   Screening for lipid disorders 07/28/2016   DOE (dyspnea on exertion) 07/02/2016   Bradycardia 07/02/2016   Anxiety 07/02/2016   Anxiety state    Atypical chest pain 03/08/2015   Fever 08/12/2010   ABDOMINAL PAIN, LEFT LOWER QUADRANT 06/23/2010   DIVERTICULITIS, ACUTE 02/25/2010   CONSTIPATION 01/22/2010   IRRITABLE BOWEL SYNDROME 01/22/2010   Primary hypertension 01/21/2010   HYPERTENSION, CONTROLLED 01/21/2010   DEEP VENOUS THROMBOPHLEBITIS 01/21/2010   ESOPHAGEAL STRICTURE 02/29/2004   Diverticulosis of colon 02/29/2004    ONSET DATE: 06/25/2023; date of referral  07/22/2023  REFERRING DIAG: I63.9 (ICD-10-CM) - CVA (cerebral vascular accident) (HCC)   THERAPY DIAG:  Aphasia  Rationale for Evaluation and Treatment Rehabilitation  SUBJECTIVE:   PERTINENT HISTORY and DIAGNOSTIC FINDINGS: Pt is a 73 year old female with medical hx significant for: HTN, hyperlipidemia, IBS, anxiety/depression, diverticulitis, OSA, PAF, DVT on  Eliquis. Pt presented to [facility] on 06/25/23 with complaint of headaches. Pt had an unwitnessed fall 4 days prior to presentation.  CT revealed large left SDH and resulting midline shift. Pt had possible seizure episode in triage.  Pt underwent emergent left-sided craniotomy for evacuation of subdural hematoma by Dr. Myer Haff on 06/25/23. Repeat CT on 2/16 which showed no evidence of recurrent SDH or stroke. Neurology consulted after possible seizure noted on 06/28/23. MRI was negative for acute changes. EEG on 06/28/23 was suggestive of cortical  dysfunction arising from left temporal region likely secondary to SDH/cranioplasty. No seizures were seen. UA positive on 2/21. Started on IV ceftriaxone. PEG tube placed by IR on 07/05/23. Repeat CT was stable. 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 MRI of right shoulder showed rotator cuff tear. Pt with ST services during CIR admission 07/13/2023 thru 07/24/2023 when she discharged to her daughter's home.   PAIN:  Are you having pain? No  FALLS: Has patient fallen in last 6 months?  See PT evaluation for details  LIVING ENVIRONMENT: Lives with: lives with their daughter Lives in: House/apartment  PLOF:  Level of assistance: Independent with ADLs, Independent with IADLs Employment: Retired   PATIENT GOALS    To improve cognitive linguistic abilities  SUBJECTIVE STATEMENT: Pt pleasant, brought in completed HEP Pt accompanied by: family member (her daughter [name])   OBJECTIVE:  TODAY'S TREATMENT:   Skilled treatment session focused on pt's aphasia goals. SLP facilitated session by providing the following interventions:  Pt brought in completed homework. "I cheated, I looked some of these up" but pt reports that she learned a lot thru doing so.  Pt benefited from intermittent Mod I to complete semi-complex verbal description tasks with semantic constraints.   PATIENT EDUCATION: Education details: word finding strategies. Person educated: Patient and Child(ren) Education method: Explanation Education comprehension: needs further education  HOME EXERCISE PROGRAM:   Completed worksheets targeting synonyms and higher level categories   GOALS:  Goals reviewed with patient? Yes  SHORT TERM GOALS: Target date: 10 sessions  With Supervision A, patient will complete a semantic feature analysis with at least 4 relevant features for 7/9 target words to improve word-finding skills.  Baseline: Goal status: INITIAL  2.  With supervision A, pt  will generate at least 10 items with complex abstract category in 7 out of 9 opportunities.  Baseline:  Goal status: INITIAL   LONG TERM GOALS: Target date: 10/27/2023  With Mod I, patient will participate in complex conversation at 95% accuracy to increase ability to communicate complex thoughts, feelings, and needs.  Baseline:  Goal status: INITIAL  2.  With Mod I, patient/family will demonstrate understanding of the following concepts: aphasia, spontaneous recovery, communication vs conversation, strengths/strategies to promote success, local resources by answering multiple choice questions with 90% accuracy when provided supported conversation in order to increase patient's participation in medical care.  Baseline:  Goal status: INITIAL   ASSESSMENT:  CLINICAL IMPRESSION: Patient is a 73 y.o. right handed female who was seen today for a speech language treatment d/t large left SDH. Pt presents with much improved verbal communication and currently presents with mild higher level language impairment c/b intermittent word finding deficits and semantic paraphasias.   Pt eager and responsive to therapeutic activities and as a result she continues to experience expressive language growth.  See the above note for treatment details.   OBJECTIVE IMPAIRMENTS include expressive language, aphasia, and apraxia. These impairments  are limiting patient from managing medications, managing appointments, managing finances, household responsibilities, ADLs/IADLs, and effectively communicating at home and in community. Factors affecting potential to achieve goals and functional outcome are  N/A . Patient will benefit from skilled SLP services to address above impairments and improve overall function.  REHAB POTENTIAL: Good  PLAN: SLP FREQUENCY: 2x/week  SLP DURATION: 12 weeks  PLANNED INTERVENTIONS: Language facilitation, Cueing hierachy, Functional tasks, Multimodal communication approach, SLP  instruction and feedback, Compensatory strategies, and Patient/family education    Summers Buendia B. Dreama Saa, M.S., CCC-SLP, Tree surgeon Certified Brain Injury Specialist [facility]  [facility] Rehabilitation Services Office [phone] Ascom [phone] Fax [phone]

## 2023-08-16 NOTE — Therapy (Signed)
 OUTPATIENT OCCUPATIONAL THERAPY NEURO TREATMENT NOTE  Patient Name: [name] MRN: [id number] DOB:[birth year], 73 y.o., female Today's Date: 08/16/2023  PCP: Bethann Punches, MD REFERRING PROVIDER: Jeanella Craze, PA-C  END OF SESSION:  OT End of Session - 08/16/23 1620     Visit Number 4    Number of Visits 24    Date for OT Re-Evaluation 10/25/23    Authorization Type Progress reporting period starting 08/02/2023    OT Start Time 1615    OT Stop Time 1656    OT Time Calculation (min) 41 min    Activity Tolerance Patient tolerated treatment well    Behavior During Therapy WFL for tasks assessed/performed            Past Medical History:  Diagnosis Date   Bradycardia    Chest pain    Constipation    Depression    Diverticulitis    Diverticulosis of colon    DOE (dyspnea on exertion)    DVT (deep venous thrombosis) (HCC)    Esophageal stricture    Hyperlipemia    Hypertension    IBS (irritable bowel syndrome)    Sleep apnea    Past Surgical History:  Procedure Laterality Date   APPENDECTOMY     BLOOD CLOT  2004   RIGHT LEG   CHOLECYSTECTOMY     COLON SURGERY     CRANIOTOMY Left 06/25/2023   Procedure: CRANIOTOMY HEMATOMA EVACUATION SUBDURAL;  Surgeon: Venetia Night, MD;  Location: [facility] ORS;  Service: Neurosurgery;  Laterality: Left;   GALLBLADDER SURGERY  2003   IR GASTROSTOMY TUBE MOD SED  07/05/2023   NISSEN FUNDOPLICATION     PLANTAR FASCIA SURGERY  2007   sigmoid colectomy  10/21/2010   pelvic anastomosis   VAGINAL HYSTERECTOMY     Patient Active Problem List   Diagnosis Date Noted   Seizure (HCC) 07/03/2023   Acute cystitis without hematuria 07/03/2023   Subdural hematoma (HCC) 06/25/2023   Midline shift of brain with brain compression (HCC) 06/25/2023   Glasgow coma scale total score 9-12 06/25/2023   Acute metabolic encephalopathy 03/01/2022   Anxiety and depression 03/01/2022   Dyslipidemia 03/01/2022   GERD without esophagitis  03/01/2022   SIRS (systemic inflammatory response syndrome) (HCC) 02/28/2022   Gastroenteritis due to norovirus    Nausea vomiting and diarrhea    Hypomagnesemia    Hypokalemia    Hyperlipidemia    Gastroesophageal reflux disease without esophagitis    Intractable vomiting 08/08/2020   Esophagitis on CT 08/08/2020   SVT (supraventricular tachycardia) (HCC) 03/07/2019   Acute respiratory distress 11/16/2017   Acute respiratory failure (HCC) 11/16/2017   Screening for lipid disorders 07/28/2016   DOE (dyspnea on exertion) 07/02/2016   Bradycardia 07/02/2016   Anxiety 07/02/2016   Anxiety state    Atypical chest pain 03/08/2015   Fever 08/12/2010   ABDOMINAL PAIN, LEFT LOWER QUADRANT 06/23/2010   DIVERTICULITIS, ACUTE 02/25/2010   CONSTIPATION 01/22/2010   IRRITABLE BOWEL SYNDROME 01/22/2010   Primary hypertension 01/21/2010   HYPERTENSION, CONTROLLED 01/21/2010   DEEP VENOUS THROMBOPHLEBITIS 01/21/2010   ESOPHAGEAL STRICTURE 02/29/2004   Diverticulosis of colon 02/29/2004   ONSET DATE: 06/25/2023  REFERRING DIAG: Subdural Hematoma  THERAPY DIAG:  Muscle weakness (generalized)  Rationale for Evaluation and Treatment: Rehabilitation  SUBJECTIVE:   SUBJECTIVE STATEMENT: Upon arrival Pt. reports having had 3/10 numbness, and tingling pain.   Pt accompanied by: daughter: Melissa  PERTINENT HISTORY: Pt. Is a 73 y.o female who sustained a  Subdural Hematoma after hitting the side of her head on a wall when reaching up into her closet. Pt. Is s/p left sided craniotomy. PMHx includes: Acute CVA, HTN, Anxiety, Depression, Dyslipidemia. Pt. Has a Hx of Right rotator cuff tear. Per MRI imaging: supraspinatus tendon tear, and biceps tendon tear. Pt. Has received a cortizone shot in the right shoulder this past week.   PRECAUTIONS: No driving  WEIGHT BEARING RESTRICTIONS: No  PAIN:  Are you having pain? 3/10 pain in right hand, wrist numbness, and tingling pain.   FALLS: Has  patient fallen in last 6 months? Yes. Number of falls 1-into the wall reaching into her closet  LIVING ENVIRONMENT: Lives with: lives alone Lives in: Mobile home Stairs: 5 steps to enter with bilateral rails Has following equipment at home: Walker - 4 wheeled and shower chair  PLOF: Independent  PATIENT GOALS: To be able to do what she wants to when she wants.  OBJECTIVE:  Note: Objective measures were completed at Evaluation unless otherwise noted.  HAND DOMINANCE: Right  ADLs:  Transfers/ambulation related to ADLs: Eating: Occasional spillage, independent using a fork for soft food. Difficulty spearing firm food, and difficulty cutting food. Difficulty holding a drink, and opening a bottle. Grooming: switches hand when opening toothpaste, uses left to brush teeth after trying with the right.  Independent with finger nails. Independent with hair care.  UB Dressing: Independent with shirt, bra. Difficulty buttoning at times, cuff buttons LB Dressing: Independent, slide on shoes Toileting: Independent Bathing:  Sink side baths, daughter assist with back Tub Shower transfers:  Has transfer tube bench, however is scared   IADLs: Shopping: Accompanied by daughter to [employer] housekeeping:  Daughter performs. Pt. Assisted with making the bed. (Daughter waiting for [place] to give the okay for Pt. 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  Uses rollator for longer distance,  POSTURE COMMENTS:  No  Significant postural limitations Sitting balance: intact  ACTIVITY TOLERANCE: Activity tolerance:  Fair  FUNCTIONAL OUTCOME MEASURES: 08/06/23: MAM-20 Neurological: 58/80  UPPER EXTREMITY ROM:    Active ROM Right eval Left Eval [facility] overall  Shoulder flexion 132(135)   Shoulder abduction 112(119)   Shoulder adduction    Shoulder extension    Shoulder internal rotation    Shoulder external rotation    Elbow flexion WFL   Elbow extension WFL   Wrist flexion    Wrist extension 30(42)   Wrist ulnar deviation    Wrist radial deviation    Wrist pronation    Wrist supination    (Blank rows = not tested)  Thumb opposition: Right: limited to the the tip of the 4th digit Left: [facility]  UPPER EXTREMITY MMT:     MMT Right eval Left Eval 5/5 overall  Shoulder flexion 4-/5   Shoulder abduction 4-/5   Shoulder adduction    Shoulder extension    Shoulder internal rotation    Shoulder external rotation    Middle trapezius    Lower trapezius    Elbow flexion 4/5   Elbow extension 4/5  Wrist flexion    Wrist extension 4-/5   Wrist ulnar deviation    Wrist radial deviation    Wrist pronation    Wrist supination    (Blank rows = not tested)  HAND FUNCTION: Grip strength: Right: 5 lbs; Left: 40 lbs, Lateral pinch: Right: 10 lbs, Left: 16 lbs, and 3 point pinch: Right: 9 lbs, Left: 14 lbs  COORDINATION: 9 Hole Peg test: Right: 60 sec; Left: 23 sec  SENSATION: WFL Light touch: WFL Proprioception: WFL  Positive numbness, cold sensation in the right thumb, 3rd, and 4th digits.  EDEMA: N/A  MUSCLE TONE: Intact  COGNITION: Overall cognitive status: Within functional limits for tasks assessed  VISION: Subjective report:  Limited vision as Pt. was scheduled for bilateral cataract surgery. However due to the injury was unable to attend. No change in vision as a result of the injury  PERCEPTION: TBD  PRAXIS: Impaired: Ideomotor   TREATMENT DATE: 08/16/2023   Paraffin  Bath:   Paraffin bath to the right hand with a towel wrap for 10 min. 2/2 pain, and stiffness. Paraffin Bath was performed in preparation for manual therapy, and ROM.   Manual Therapy:  STM was performed to the volar surface of the palm, thenar eminence, thumb, and 3rd/4th digits.  Therapeutic Ex:  -Median nerve glides were reviewed for the right hand. Pt. was provided with a visual handout.  -Right hand tendon glides were reviewed with the Pt.  Self-care: -assessed pen grasp, and writing legibility using a Pen grip, and a Pen again. Pt. was able to formulate multiple sentences with 75% legibility using a Pen Again.      PATIENT EDUCATION: Education details: Opportunities for engaging the right hand during activity at home. Person educated: Patient and Child(ren) Education method: Explanation, Demonstration, and Verbal cues, visual handout Education comprehension: verbalized understanding, returned demonstration, verbal cues required, and needs further education  HOME EXERCISE PROGRAM: Pink theraputty  GOALS: Goals reviewed with patient? Yes  SHORT TERM GOALS: Target date: 09/13/2023  Pt. Will be independent with HEPs for the RUE, and hand. Baseline: Eval: No current HEP. Goal status: INITIAL  LONG TERM GOALS: Target date: 10/25/2023  Pt, will improve Right shoulder ROM by 10 degrees with assist rea up into cabinetry, and closets. Baseline: Eval: Right shoulder flexion: 132(135), abduction: 112(119) Goal status: INITIAL  2.  Pt. Will improve right grip strength by 5# to be able to hold a beverage securely. Baseline: Eval: R: 5#, L: 40#. Pt. Has difficulty securely holding a beverage Goal status: INITIAL  3.  Pt. Will improve right pinch strength by 3# to be able to open a bottle. Baseline: Eval: Lateral pinch: R: 10#, L: 16#, 3 pt. pinch: R: 9#, L: 14# Goal status: INITIAL  4.  Pt. Will improve right hand [facility] skills by 5 sec. To be able to efficiently manipulate small  objects for ADL/IADLs Baseline: Eval: 9 hole peg test: R: 60 sec.; Left: 23 sec. Goal status: INITIAL  5.   Pt. Will perform light home making tasks with Modified independence. Baseline: Eval: Daughter is completing home management tasks, and waiting the okay before Pt. Completes  Goal status: INITIAL  6.  Pt. Will use utensils with modified independence  Baseline: Eval: Pt. has difficulty spearing food with a fork, and cutting food.  Goal status: INITIAL  7.  Pt will increase MAM-20 score by 10 or more points to indicate self perceived improvement in functional use of R hand for daily tasks.  Baseline: 08/06/23: 58/80  Goal status: INITIAL  ASSESSMENT:  CLINICAL IMPRESSION:  Pt. presented with 3/10 cold, tingling  pain in the right wrist, palm, thumb, 3rd & 4th digits upon arrival, however reported no pain following treatment this afternoon.  Pt. Tolerated the Paraffin Bath, manual therapy, and stretches well today.  Pt. Continues to present with weakness, and limited  right hand coordination. Pt. Continues. Pt. Performed writing tasks with 75% legibility using a PenAgain. Pt. continues to benefit from OT services to work on improving RUE functioning in order to improve engagement in, and maximize independence with ADLs, and IADL tasks.   PERFORMANCE DEFICITS: in functional skills including ADLs, IADLs, coordination, dexterity, ROM, strength, Fine motor control, Gross motor control, decreased knowledge of use of DME, and UE functional use, cognitive skills including , and psychosocial skills including coping strategies, environmental adaptation, and routines and behaviors.   IMPAIRMENTS: are limiting patient from ADLs, IADLs, and leisure.   CO-MORBIDITIES: may have co-morbidities  that affects occupational performance. Patient will benefit from skilled OT to address above impairments and improve overall function.  MODIFICATION OR ASSISTANCE TO COMPLETE EVALUATION: Min-Moderate  modification of tasks or assist with assess necessary to complete an evaluation.  OT OCCUPATIONAL PROFILE AND HISTORY: Detailed assessment: Review of records and additional review of physical, cognitive, psychosocial history related to current functional performance.  CLINICAL DECISION MAKING: Moderate - several treatment options, min-mod task modification necessary  REHAB POTENTIAL: Good  EVALUATION COMPLEXITY: Moderate    PLAN:  OT FREQUENCY: 2x/week  OT DURATION: 12 weeks  PLANNED INTERVENTIONS: 97535 self care/ADL training, 16109 therapeutic exercise, 97530 therapeutic activity, 97112 neuromuscular re-education, 97140 manual therapy, 97018 paraffin, 60454 contrast bath, [id number] Cognitive training (first 15 min), 09811 Cognitive training(each additional 15 min), 91478 Orthotics management and training, 29562 Splinting (initial encounter), patient/family education, and DME and/or AE instructions  RECOMMENDED OTHER SERVICES:   PT, ST  CONSULTED AND AGREED WITH PLAN OF CARE: Patient and family member/caregiver: Melissa  PLAN FOR NEXT SESSION: Treatment  Olegario Messier, MS, OTR/L    08/16/2023, 5:05 PM

## 2023-08-17 ENCOUNTER — Encounter: Payer: Self-pay | Admitting: Physical Medicine & Rehabilitation

## 2023-08-17 ENCOUNTER — Encounter: Attending: Physical Medicine & Rehabilitation | Admitting: Physical Medicine & Rehabilitation

## 2023-08-17 VITALS — BP 131/78 | HR 95 | Wt 133.0 lb

## 2023-08-17 DIAGNOSIS — Z931 Gastrostomy status: Secondary | ICD-10-CM

## 2023-08-17 NOTE — Progress Notes (Signed)
 Subjective:    Patient ID: [name], female    DOB: 1951/04/10, 73 y.o.   MRN: [id number] 73 y.o. female with history of HTN, DVT, esophageal stricture, OSA, depression; who was admitted on 06/25/2023. She with reports of fall 3 to 4 days prior to admission with headaches and mental status changes.  She had seizure type activity in ED and was found to have a large left cerebral SDH measuring 1.1 x 1.2 cm and rightward midline shift with early uncal herniation and mass effect on left lateral ventricular system.  Eliquis reversed, she was transfused with 1 units PRBC for anemia with hemoglobin of 6 and taken to the OR emergently for craniotomy for evacuation of hematoma.  Postop required Cardene drip for BP control and had issues with pain and agitation.  Repeat CT showed no significant change.  EEG was negative for seizures.  E. coli/Citrobacter UTI treated with 5-day course of ceftriaxone she continued to have fluctuating mental status and and fevers with urine showing > 100,000 colonies of enterococcus felt to be contaminant.  Dr. Marcell Barlow questioned need for MMA embolization pending repeat CT head in 4 weeks.  PAF managed with addition of metoprolol added for rate control and MRI brain repeated on 02/26 revealing small acute cortical infarcts in left frontal and parietal lobe. She was started on IV heparin per neurology input and Apixaban resumed on 02/28  Admit date: 07/12/2023 Discharge date: 07/24/2023 HPI Patient is at home doing well except for a fall which required ED visit.  She was reaching for something on the ground and hit her head. She had no significant injury and is doing well with outpatient PT OT and speech. She is swallowing well good intake of food fluids and medications.  She has had her gastrostomy tube in for 6 weeks and 1 day and would like to get it out. She would like she would like to go back to driving.  According to the daughter there was some questionable seizure  prior to arriving at the hospital.  We discussed that  Neurourgeon would need to give the okay for driving given the history above.   Pain Inventory Average Pain 3 Pain Right Now 2 My pain is constant, stabbing, tingling, and numb  LOCATION OF PAIN  right hand, right arm, right foot, right shoulder  BOWEL Number of stools per week: 3 Oral laxative use No   BLADDER Normal   Mobility walk without assistance ability to climb steps?  yes do you drive?  no Do you have any goals in this area?  yes  Function retired  Neuro/Psych weakness numbness tingling  Prior Studies Seen in at [facility] last week  Physicians involved in your care Any changes since last visit?  no   Family History  Problem Relation Age of Onset   Heart disease Mother    Crohn's disease Sister    Colon polyps Sister    Irritable bowel syndrome Brother    Brain cancer Father    Colitis Sister    Diabetes Sister    Breast cancer Cousin    Colon cancer Neg Hx    Social History   Socioeconomic History   Marital status: Divorced    Spouse name: Not on file   Number of children: 1   Years of education: GED   Highest education level: GED or equivalent  Occupational History   Occupation: retired Psychiatric nurse  Tobacco Use   Smoking status: Never   Smokeless tobacco:  Never  Vaping Use   Vaping status: Never Used  Substance and Sexual Activity   Alcohol use: No   Drug use: No   Sexual activity: Not on file  Other Topics Concern   Not on file  Social History Narrative   Epworth Sleepiness Score = 9 (03/08/2015)   Lives alone in a one story home.  Has one daughter.  Retired.  Education: GED   Social Drivers of Corporate investment banker Strain: Low Risk  (07/30/2023)   Received from [facility]   Overall Financial Resource Strain (CARDIA)    Difficulty of Paying Living Expenses: Not hard at all  Food Insecurity: No Food Insecurity (07/30/2023)   [facility]   Hunger Vital Sign    Worried About Running Out of Food in the Last Year: Never true    Ran Out of Food in the Last Year: Never true  Transportation Needs: No Transportation Needs (07/30/2023)   Received from [facility] - Transportation    In the past 12 months, has lack of transportation kept you from medical appointments or from getting medications?: No    Lack of Transportation (Non-Medical): No  Physical Activity: Not on file  Stress: Not on file  Social Connections: Not on file   Past Surgical History:  Procedure Laterality Date   APPENDECTOMY     BLOOD CLOT  2004   RIGHT LEG   CHOLECYSTECTOMY     COLON SURGERY     CRANIOTOMY Left 06/25/2023   Procedure: CRANIOTOMY HEMATOMA EVACUATION SUBDURAL;  Surgeon: Venetia Night, MD;  Location: [facility] ORS;  Service: Neurosurgery;  Laterality: Left;   GALLBLADDER SURGERY  2003   IR GASTROSTOMY TUBE MOD SED  07/05/2023   NISSEN FUNDOPLICATION     PLANTAR FASCIA SURGERY  2007   sigmoid colectomy  10/21/2010   pelvic anastomosis   VAGINAL HYSTERECTOMY     Past Medical History:  Diagnosis Date   Bradycardia    Chest pain    Constipation    Depression    Diverticulitis    Diverticulosis of colon    DOE (dyspnea on exertion)    DVT (deep venous thrombosis) (HCC)    Esophageal stricture    Hyperlipemia    Hypertension    IBS (irritable bowel syndrome)    Sleep apnea    Wt 133 lb (60.3 kg)   BMI 25.13 kg/m   Opioid Risk Score:   Fall Risk Score:  `1  Depression screen [facility] 2/9     08/17/2023    1:24 PM  Depression screen PHQ 2/9  Decreased Interest 0  Down, Depressed, Hopeless 0  PHQ - 2 Score 0  Altered sleeping 0  Tired, decreased energy 1  Change in appetite 1  Feeling bad or failure about yourself  0  Trouble concentrating 0  Moving slowly or fidgety/restless 0  Suicidal thoughts 0  PHQ-9 Score 2    Review of Systems  Neurological:  Positive for weakness  and numbness.       Tingling  All other systems reviewed and are negative.      Objective:   Physical Exam General No acute distress Mood and affect appropriate Well-healed incision the left medial scalp Motor strength is 4/5 in the right deltoid bicep tricep grip 5/5 in the left deltoid bicep positive grip 5/5 bilateral hip flexor knee extensor ankle dorsiflexor Ambulates without assistive device no evidence of toe drag  or knee instability she is able to toe walk and heel walk but has difficulty with tandem gait. She has normal range of motion at the shoulders. Speech minimal dysarthria mild anomia Reduced finger thumb opposition in terms of dexterity right upper extremity.      Assessment & Plan:   1.  Left subdural hematoma due to fall.  Overall making excellent progress.  Minimal weakness right upper extremity still has some motor control issues And higher-level balance issues.  She also has mild aphasia. Continue outpatient therapy PT OT speech Follow-up with neurosurgery on the question of driving 2.  Dysphagia resolved, will make referral to interventional radiology at [facility] to remove gastrostomy tube

## 2023-08-18 ENCOUNTER — Ambulatory Visit: Admitting: Occupational Therapy

## 2023-08-18 ENCOUNTER — Other Ambulatory Visit (HOSPITAL_COMMUNITY): Payer: Self-pay | Admitting: Physical Medicine & Rehabilitation

## 2023-08-18 ENCOUNTER — Encounter: Payer: Self-pay | Admitting: Neurosurgery

## 2023-08-18 ENCOUNTER — Encounter: Payer: Self-pay | Admitting: Physical Medicine & Rehabilitation

## 2023-08-18 ENCOUNTER — Ambulatory Visit: Admitting: Speech Pathology

## 2023-08-18 ENCOUNTER — Ambulatory Visit: Admitting: Physical Therapy

## 2023-08-18 DIAGNOSIS — Z931 Gastrostomy status: Secondary | ICD-10-CM

## 2023-08-18 DIAGNOSIS — R2681 Unsteadiness on feet: Secondary | ICD-10-CM

## 2023-08-18 DIAGNOSIS — M6281 Muscle weakness (generalized): Secondary | ICD-10-CM | POA: Diagnosis not present

## 2023-08-18 DIAGNOSIS — R41841 Cognitive communication deficit: Secondary | ICD-10-CM

## 2023-08-18 DIAGNOSIS — R262 Difficulty in walking, not elsewhere classified: Secondary | ICD-10-CM

## 2023-08-18 DIAGNOSIS — R4701 Aphasia: Secondary | ICD-10-CM

## 2023-08-18 NOTE — Therapy (Signed)
 OUTPATIENT SPEECH LANGUAGE PATHOLOGY  APHASIA TREATMENT DISCHARGE SUMMARY  Patient Name: [name] MRN: [id number] DOB:1951-05-04, 73 y.o., female Today's Date: 08/18/2023  PCP: Bethann Punches, MD REFERRING PROVIDER: Jeanella Craze, PA-C   End of Session - 08/18/23 1530     Visit Number 5    Number of Visits 25    Date for SLP Re-Evaluation 10/25/23    Authorization Type Medicare A/B    Progress Note Due on Visit 10    SLP Start Time 1530    SLP Stop Time  1615    SLP Time Calculation (min) 45 min    Activity Tolerance Patient tolerated treatment well             Past Medical History:  Diagnosis Date   Bradycardia    Chest pain    Constipation    Depression    Diverticulitis    Diverticulosis of colon    DOE (dyspnea on exertion)    DVT (deep venous thrombosis) (HCC)    Esophageal stricture    Hyperlipemia    Hypertension    IBS (irritable bowel syndrome)    Sleep apnea    Past Surgical History:  Procedure Laterality Date   APPENDECTOMY     BLOOD CLOT  2004   RIGHT LEG   CHOLECYSTECTOMY     COLON SURGERY     CRANIOTOMY Left 06/25/2023   Procedure: CRANIOTOMY HEMATOMA EVACUATION SUBDURAL;  Surgeon: Venetia Night, MD;  Location: [facility] ORS;  Service: Neurosurgery;  Laterality: Left;   GALLBLADDER SURGERY  2003   IR GASTROSTOMY TUBE MOD SED  07/05/2023   NISSEN FUNDOPLICATION     PLANTAR FASCIA SURGERY  2007   sigmoid colectomy  10/21/2010   pelvic anastomosis   VAGINAL HYSTERECTOMY     Patient Active Problem List   Diagnosis Date Noted   Seizure (HCC) 07/03/2023   Acute cystitis without hematuria 07/03/2023   Subdural hematoma (HCC) 06/25/2023   Midline shift of brain with brain compression (HCC) 06/25/2023   Glasgow coma scale total score 9-12 06/25/2023   Acute metabolic encephalopathy 03/01/2022   Anxiety and depression 03/01/2022   Dyslipidemia 03/01/2022   GERD without esophagitis 03/01/2022   SIRS (systemic inflammatory response  syndrome) (HCC) 02/28/2022   Gastroenteritis due to norovirus    Nausea vomiting and diarrhea    Hypomagnesemia    Hypokalemia    Hyperlipidemia    Gastroesophageal reflux disease without esophagitis    Intractable vomiting 08/08/2020   Esophagitis on CT 08/08/2020   SVT (supraventricular tachycardia) (HCC) 03/07/2019   Acute respiratory distress 11/16/2017   Acute respiratory failure (HCC) 11/16/2017   Screening for lipid disorders 07/28/2016   DOE (dyspnea on exertion) 07/02/2016   Bradycardia 07/02/2016   Anxiety 07/02/2016   Anxiety state    Atypical chest pain 03/08/2015   Fever 08/12/2010   ABDOMINAL PAIN, LEFT LOWER QUADRANT 06/23/2010   DIVERTICULITIS, ACUTE 02/25/2010   CONSTIPATION 01/22/2010   IRRITABLE BOWEL SYNDROME 01/22/2010   Primary hypertension 01/21/2010   HYPERTENSION, CONTROLLED 01/21/2010   DEEP VENOUS THROMBOPHLEBITIS 01/21/2010   ESOPHAGEAL STRICTURE 02/29/2004   Diverticulosis of colon 02/29/2004    ONSET DATE: 06/25/2023; date of referral  07/22/2023  REFERRING DIAG: I63.9 (ICD-10-CM) - CVA (cerebral vascular accident) (HCC)   THERAPY DIAG:  Aphasia  Cognitive communication deficit  Rationale for Evaluation and Treatment Rehabilitation  SUBJECTIVE:   PERTINENT HISTORY and DIAGNOSTIC FINDINGS: Pt is a 73 year old female with medical hx significant for: HTN, hyperlipidemia, IBS,  anxiety/depression, diverticulitis, OSA, PAF, DVT on Eliquis. Pt presented to [facility] on 06/25/23 with complaint of headaches. Pt had an unwitnessed fall 4 days prior to presentation.  CT revealed large left SDH and resulting midline shift. Pt had possible seizure episode in triage.  Pt underwent emergent left-sided craniotomy for evacuation of subdural hematoma by Dr. Myer Haff on 06/25/23. Repeat CT on 2/16 which showed no evidence of recurrent SDH or stroke. Neurology consulted after possible seizure noted on 06/28/23. MRI was negative for acute  changes. EEG on 06/28/23 was suggestive of cortical dysfunction arising from left temporal region likely secondary to SDH/cranioplasty. No seizures were seen. UA positive on 2/21. Started on IV ceftriaxone. PEG tube placed by IR on 07/05/23. Repeat CT was stable. 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 MRI of right shoulder showed rotator cuff tear. Pt with ST services during CIR admission 07/13/2023 thru 07/24/2023 when she discharged to her daughter's home.   PAIN:  Are you having pain? No  FALLS: Has patient fallen in last 6 months?  See PT evaluation for details  LIVING ENVIRONMENT: Lives with: lives with their daughter Lives in: House/apartment  PLOF:  Level of assistance: Independent with ADLs, Independent with IADLs Employment: Retired   PATIENT GOALS    To improve cognitive linguistic abilities  SUBJECTIVE STATEMENT: Pt pleasant, brought in completed HEP Pt accompanied by: family member (her daughter [name])   OBJECTIVE:  TODAY'S TREATMENT:   Skilled treatment session focused on pt's aphasia goals. SLP facilitated session by providing the following interventions:  Pt brought in completed homework. "I cheated again" Pt reports that she looked up some answers again.   Pt independent with complex verbal description tasks with semantic constraints.   PATIENT EDUCATION: Education details: word finding strategies. Person educated: Patient and Child(ren) Education method: Explanation Education comprehension: needs further education  HOME EXERCISE PROGRAM:   Completed worksheets targeting synonyms and higher level categories   GOALS:  Goals reviewed with patient? Yes  SHORT TERM GOALS: Target date: 10 sessions  With Supervision A, patient will complete a semantic feature analysis with at least 4 relevant features for 7/9 target words to improve word-finding skills.  Baseline: Goal status: INITIAL: MET - currently independent  2.   With supervision A, pt will generate at least 10 items with complex abstract category in 7 out of 9 opportunities.  Baseline:  Goal status: INITIAL: MET - currently independent   LONG TERM GOALS: Target date: 10/27/2023  With Mod I, patient will participate in complex conversation at 95% accuracy to increase ability to communicate complex thoughts, feelings, and needs.  Baseline:  Goal status: INITIAL: MET - currently independent  2.  With Mod I, patient/family will demonstrate understanding of the following concepts: aphasia, spontaneous recovery, communication vs conversation, strengths/strategies to promote success, local resources by answering multiple choice questions with 90% accuracy when provided supported conversation in order to increase patient's participation in medical care.  Baseline:  Goal status: INITIAL: MET - currently independent   ASSESSMENT:  CLINICAL IMPRESSION: Patient is a 73 y.o. right handed female who was seen today for a speech language treatment d/t large left SDH. Pt has made great progress towards her goals with no residual impairments in cognitive communication abilities at this time. All education has been completed and pt is appropriate for discharge from services.    PLAN: Pt is appropriate for discharge from services.     Jorene Kaylor B. Dreama Saa, M.S., CCC-SLP, CBIS  Engineer, petroleum Injury Specialist [facility]  [facility] Office [phone] Ascom [phone] Fax [phone]

## 2023-08-18 NOTE — Therapy (Unsigned)
 OUTPATIENT PHYSICAL THERAPY NEURO  TREATMENT   Patient Name: [name] MRN: [id number] DOB:[birth year], 73 y.o., female Today's Date: 08/18/2023   PCP: [name], MD REFERRING PROVIDER: Trey Paula, PA-C  END OF SESSION:  PT End of Session - 08/18/23 1449     Visit Number 6    Number of Visits 25    Date for PT Re-Evaluation 10/25/23    Progress Note Due on Visit 10    PT Start Time 1447    PT Stop Time 1528    PT Time Calculation (min) 41 min    Equipment Utilized During Treatment Gait belt    Activity Tolerance Patient tolerated treatment well    Behavior During Therapy WFL for tasks assessed/performed               Past Medical History:  Diagnosis Date   Bradycardia    Chest pain    Constipation    Depression    Diverticulitis    Diverticulosis of colon    DOE (dyspnea on exertion)    DVT (deep venous thrombosis) (HCC)    Esophageal stricture    Hyperlipemia    Hypertension    IBS (irritable bowel syndrome)    Sleep apnea    Past Surgical History:  Procedure Laterality Date   APPENDECTOMY     BLOOD CLOT  2004   RIGHT LEG   CHOLECYSTECTOMY     COLON SURGERY     CRANIOTOMY Left 06/25/2023   Procedure: CRANIOTOMY HEMATOMA EVACUATION SUBDURAL;  Surgeon: Venetia Night, MD;  Location: [facility] ORS;  Service: Neurosurgery;  Laterality: Left;   GALLBLADDER SURGERY  2003   IR GASTROSTOMY TUBE MOD SED  07/05/2023   NISSEN FUNDOPLICATION     PLANTAR FASCIA SURGERY  2007   sigmoid colectomy  10/21/2010   pelvic anastomosis   VAGINAL HYSTERECTOMY     Patient Active Problem List   Diagnosis Date Noted   Seizure (HCC) 07/03/2023   Acute cystitis without hematuria 07/03/2023   Subdural hematoma (HCC) 06/25/2023   Midline shift of brain with brain compression (HCC) 06/25/2023   Glasgow coma scale total score 9-12 06/25/2023   Acute metabolic encephalopathy 03/01/2022   Anxiety and depression 03/01/2022   Dyslipidemia 03/01/2022   GERD  without esophagitis 03/01/2022   SIRS (systemic inflammatory response syndrome) (HCC) 02/28/2022   Gastroenteritis due to norovirus    Nausea vomiting and diarrhea    Hypomagnesemia    Hypokalemia    Hyperlipidemia    Gastroesophageal reflux disease without esophagitis    Intractable vomiting 08/08/2020   Esophagitis on CT 08/08/2020   SVT (supraventricular tachycardia) (HCC) 03/07/2019   Acute respiratory distress 11/16/2017   Acute respiratory failure (HCC) 11/16/2017   Screening for lipid disorders 07/28/2016   DOE (dyspnea on exertion) 07/02/2016   Bradycardia 07/02/2016   Anxiety 07/02/2016   Anxiety state    Atypical chest pain 03/08/2015   Fever 08/12/2010   ABDOMINAL PAIN, LEFT LOWER QUADRANT 06/23/2010   DIVERTICULITIS, ACUTE 02/25/2010   CONSTIPATION 01/22/2010   IRRITABLE BOWEL SYNDROME 01/22/2010   Primary hypertension 01/21/2010   HYPERTENSION, CONTROLLED 01/21/2010   DEEP VENOUS THROMBOPHLEBITIS 01/21/2010   ESOPHAGEAL STRICTURE 02/29/2004   Diverticulosis of colon 02/29/2004    ONSET DATE: 06/21/2023  REFERRING DIAG:  Diagnosis  I63.9 (ICD-10-CM) - CVA (cerebral vascular accident) (HCC)    THERAPY DIAG:  Muscle weakness (generalized)  Unsteadiness on feet  Difficulty in walking, not elsewhere classified  Rationale for Evaluation  and Treatment: Rehabilitation  SUBJECTIVE:                                                                                                                                                      SUBJECTIVE STATEMENT:   Since last seen pt accidentally hit her head on the doorframe of her car, no LOC. Her head was hurting after that, was advised to go to the ED & went on 08/12/2023.  Everything was found to be OK/no new injury. No stumbles/falls. Pt has been ambulating without an AD. Just has some R hand pain but this is not new.   Pt accompanied by: With DTR Melissa.  PERTINENT HISTORY:    From Eval: Pt is a pleasant 73 y/o  female referred to PT s/p SDH due to fall 06/21/2023. She reports she fell in her bathroom and hit her head. She did not immediately seek care, but later developed a bad headache. She has no memory of an ambulance being called, but does remember waking up in the hospital. Pt found to have bilat subdural hematoma & reports hospital stay of around 31 days. She received inpatient PT/OT/Speech. She reports her balance is still off, was not using an AD prior to this but now uses a RW. She is not very confident in her balance. She reports no stumbles/falls since initial fall in bathroom. Pt mostly bathing on her own and dressing herself, although she does get some help from her daughter. Pt currently has a PEG tube, will be getting this removed soon. Other concerns consist of difficulty using RUE, difficulty opening things and buttoning clothes. She has RUE pain. Worst pain in past two weeks has been 10/10, lowest pain level in past two weeks has been 4/10. Pt also reports dizziness if she looks up or lays down, but states she has a long hx of vertigo of many years. Her neck can hurt when she turns her head.    Note via ED discharge:  "Brief HPI:  [name] is a 73 y.o. female with history of HTN, DVT, esophageal stricture, OSA, depression; who was admitted on 06/25/2023. She with reports of fall 3 to 4 days prior to admission with headaches and mental status changes.  She had seizure type activity in ED and was found to have a large left cerebral SDH measuring 1.1 x 1.2 cm and rightward midline shift with early uncal herniation and mass effect on left lateral ventricular system.  Eliquis reversed, she was transfused with 1 units PRBC for anemia with hemoglobin of 6 and taken to the OR emergently for craniotomy for evacuation of hematoma.  Postop required Cardene drip for BP control and had issues with pain and agitation.  Repeat CT showed no significant change.  EEG was negative for seizures.  E. coli/Citrobacter  UTI treated with  5-day course of ceftriaxone she continued to have fluctuating mental status and and fevers with urine showing > 100,000 colonies of enterococcus felt to be contaminant.  Dr. Marcell Barlow questioned need for MMA embolization pending repeat CT head in 4 weeks.  PAF managed with addition of metoprolol added for rate control and MRI brain repeated on 02/26 revealing small acute cortical infarcts in left frontal and parietal lobe. She was started on IV heparin per neurology input and Apixaban resumed on 02/28."  Please refer to chart/above for full PMH  PAIN:  Are you having pain? No not currently, occ finger numbness (R side) and RUE pain, had recent cortisone shot and this helped  PRECAUTIONS: Fall and Other: pt currently has PEG tube  RED FLAGS: Is going to see speech for swallowing/has had esophagus expansion in past    WEIGHT BEARING RESTRICTIONS: No  FALLS: Has patient fallen in last 6 months? Yes. Number of falls 1  LIVING ENVIRONMENT: Lives with: lives with their daughter Lives in: House/apartment Stairs:  5 steps with bilat handrails Has following equipment at home: Walker - 4 wheeled and shower chair  PLOF: Independent  PATIENT GOALS: Goals get back to being independent on her own. Was not living with daughter until d/c from hospital  OBJECTIVE:  Note: Objective measures were completed at Evaluation unless otherwise noted.  DIAGNOSTIC FINDINGS:  via chart  CT HEAD 07/22/23: "IMPRESSION: Expected evolution with regression of subdural hematoma on the left. No new or progressive finding.     Electronically Signed   By: Tiburcio Pea M.D.   On: 07/22/2023 08:30  "  US carotid bilat 07/11/23 "IMPRESSION: No significant stenosis of internal carotid arteries.     Electronically Signed   By: Acquanetta Belling M.D.   On: 07/11/2023 16:30"  MR brain 07/07/23: "IMPRESSION: 1. Small acute cortical infarcts within the left frontal and parietal lobes (deep to the  cranioplasty). 2. Subdural hematomas overlying the bilateral cerebral hemispheres have not significantly changed in extent. However, new from the prior brain MRI of 06/27/2023, there is apparent restricted diffusion within these collections. This may simply reflect susceptibility artifact from blood products. However, restricted diffusion can also be seen in subdural empyemas and empyemas cannot be excluded by imaging. 4 mm rightward midline shift, unchanged.     Electronically Signed   By: Jackey Loge D.O.   On: 07/07/2023 21:58"  COGNITION: Overall cognitive status: Within functional limits for tasks assessed   SENSATION: Pt reports impaired sensation RUE (dominant side) and impaired sensation in her feet occ  COORDINATION: WFL rapid alt LE, WFL heel>shin bilaterally WFL rapid alt UE bilaterally   EDEMA:  Reports she does get some swelling occ around her stomach, reports hx of digestive issues/celiac, current PEG tube, hx gallbladder surgery and other intestinal surgery?     POSTURE: rounded shoulders   LOWER EXTREMITY MMT:   Grossly 4+/5 bilat LE most deficits   RUE MMT:  BUE grossly 4 to 4+/5 - comparabale each side & no pain with testing  Grip strength 4+/5 bilat UE   TRANSFERS: Assistive device utilized: Environmental consultant - 4 wheeled  Sit to stand: SBA Stand to sit: SBA  GAIT: Gait pattern:  impaired gait speed, CGA provided or use of 4WW, observed decrease in trunk rotation Distance walked: clinic distances, 10 MWT Assistive device utilized: Walker - 4 wheeled and no AD but with close CGA  FUNCTIONAL TESTS:  5 times sit to stand: 13 sec hands-free :  0.96 m/s  no AD CGA  decrease in trunk rotation SLB: <5 sec bilat LE  Tandem stance: within 15-25 sec, BLE extremely tremulous, use of hip strategy   BERG: not assessed  DGI: not assessed     PATIENT SURVEYS:  ABC scale 47.5%                                                                                                                               TREATMENT DATE: 08/18/23  Throughout session, PT provided CGA for safety unless otherwise noted.  TE  LAQ 2 x 12 ea LE with 3# AW   Therapeutic activities:   Ambulation with 3# AW x 1000 ft   Rear lunge x 10 reps ea LE with UE support cues for step width for optimal balance and for bending of both knees for optimal weight distribution   Fast gait over 10 m without AD able to ambulate 1.1 m/s - no unsteadiness   NMR:   Basketball shot on airex with lateral reach to basket for each ball x 10 ea side   On airex:  -NBOS EO with attempt of vertical & horizontal head turns - reps limited by dizziness (chronic issue) x 10 head turns ea - airex alateral step up x 10 ea  Airex forward step up and down x 10   Airex step tap ( airex to 6 in step) x 10 ea LE      PATIENT EDUCATION: Education details: exercise technique Person educated: Patient and   Education method: Explanation Education comprehension: verbalized understanding  HOME EXERCISE PROGRAM: Access Code: [email or web address] URL: [email or web address] Date: 08/06/2023 Prepared by: Thresa Ross  Exercises - Standing Single Leg Stance with Counter Support  - 1 x daily - 7 x weekly - 2 sets - 30 sec  hold - Sit to Stand with Arms Crossed  - 1 x daily - 7 x weekly - 2 sets - 10 reps - Narrow Stance with Head Nods and Counter Support  - 1 x daily - 7 x weekly - 2 sets - 30 sec  hold  GOALS: Goals reviewed with patient? Yes   SHORT TERM GOALS: Target date: 09/13/2023   Patient will be independent in home exercise program to improve strength/mobility for better functional independence with ADLs. Baseline: Goal status: INITIAL   LONG TERM GOALS: Target date: 10/25/2023   Patient will increase ABC scale score >80% to demonstrate better functional mobility and better confidence with ADLs.   Baseline: 47.5% Goal status: INITIAL  2.  Patient will complete five times  sit to stand test in < 10 seconds indicating an increased LE strength and improved balance. Baseline: 13 sec hands-free Goal status: INITIAL  3.  Patient will increase Berg Balance score by > 6 points to demonstrate decreased fall risk during functional activities Baseline: 47 Goal status: INITIAL  4.  Patient will increase 10 meter walk test to >1.23m/s as to improve gait speed for better community  ambulation and to reduce fall risk. Baseline: 0.96 m/s Goal status: INITIAL  5.  Patient will increase dynamic gait index score to >19/24 as to demonstrate reduced fall risk and improved dynamic gait balance for better safety with community/home ambulation.   Baseline: 19 Goal status: INITIAL   ASSESSMENT:  CLINICAL IMPRESSION:  Pt making gains AEB ambulating consistently without AD without LOB. Pt found to have most difficulty with SLB tasks in session, particularly cone tap obstacle course. Will continue to address this deficit.  Pt will continue to benefit from skilled physical therapy intervention to address impairments, improve QOL, and attain therapy goals.   OBJECTIVE IMPAIRMENTS: Abnormal gait, decreased balance, decreased mobility, difficulty walking, decreased strength, dizziness, increased edema, impaired sensation, impaired UE functional use, improper body mechanics, postural dysfunction, and pain.   ACTIVITY LIMITATIONS: carrying, lifting, bending, standing, squatting, stairs, transfers, dressing, and locomotion level  PARTICIPATION LIMITATIONS: meal prep, cleaning, laundry, driving, shopping, community activity, and yard work  PERSONAL FACTORS: Age, Sex, and 3+ comorbidities:  HTN, DVT, esophageal stricture, OSA, depression, please see above for extensive PMH  are also affecting patient's functional outcome.   REHAB POTENTIAL: Good  CLINICAL DECISION MAKING: Evolving/moderate complexity  EVALUATION COMPLEXITY: Moderate  PLAN:  PT FREQUENCY: 1-2x/week  PT DURATION: 12  weeks  PLANNED INTERVENTIONS: 97164- PT Re-evaluation, 97110-Therapeutic exercises, 97530- Therapeutic activity, 97112- Neuromuscular re-education, 97535- Self Care, [id number]- Manual therapy, [phone]- Gait training, [phone]- Orthotic Fit/training, [phone]- Canalith repositioning, Patient/Family education, Balance training, Stair training, Taping, Joint mobilization, Spinal mobilization, Vestibular training, DME instructions, Wheelchair mobility training, Cryotherapy, and Moist heat  PLAN FOR NEXT SESSION:   Continue high level balance training.    Norman Herrlich, PT 08/18/2023, 2:50 PM

## 2023-08-23 ENCOUNTER — Ambulatory Visit: Admitting: Speech Pathology

## 2023-08-23 ENCOUNTER — Ambulatory Visit: Admitting: Occupational Therapy

## 2023-08-23 ENCOUNTER — Ambulatory Visit

## 2023-08-23 DIAGNOSIS — R2681 Unsteadiness on feet: Secondary | ICD-10-CM

## 2023-08-23 DIAGNOSIS — M6281 Muscle weakness (generalized): Secondary | ICD-10-CM

## 2023-08-23 DIAGNOSIS — R200 Anesthesia of skin: Secondary | ICD-10-CM

## 2023-08-23 DIAGNOSIS — R278 Other lack of coordination: Secondary | ICD-10-CM

## 2023-08-23 DIAGNOSIS — R262 Difficulty in walking, not elsewhere classified: Secondary | ICD-10-CM

## 2023-08-23 NOTE — Therapy (Signed)
 OUTPATIENT PHYSICAL THERAPY NEURO  TREATMENT   Patient Name: [name] Barrera MRN: [id number] DOB:12-02-1950, 73 y.o., female Today's Date: 08/23/2023   PCP: [name] Penton, MD REFERRING PROVIDER: Trey Paula, PA-C  END OF SESSION:  PT End of Session - 08/23/23 1436     Visit Number 7    Number of Visits 25    Date for PT Re-Evaluation 10/25/23    Progress Note Due on Visit 10    PT Start Time 1440    PT Stop Time 1520    PT Time Calculation (min) 40 min    Equipment Utilized During Treatment Gait belt    Activity Tolerance Patient tolerated treatment well    Behavior During Therapy WFL for tasks assessed/performed                Past Medical History:  Diagnosis Date   Bradycardia    Chest pain    Constipation    Depression    Diverticulitis    Diverticulosis of colon    DOE (dyspnea on exertion)    DVT (deep venous thrombosis) (HCC)    Esophageal stricture    Hyperlipemia    Hypertension    IBS (irritable bowel syndrome)    Sleep apnea    Past Surgical History:  Procedure Laterality Date   APPENDECTOMY     BLOOD CLOT  2004   RIGHT LEG   CHOLECYSTECTOMY     COLON SURGERY     CRANIOTOMY Left 06/25/2023   Procedure: CRANIOTOMY HEMATOMA EVACUATION SUBDURAL;  Surgeon: [name] Night, MD;  Location: [facility] ORS;  Service: Neurosurgery;  Laterality: Left;   GALLBLADDER SURGERY  2003   IR GASTROSTOMY TUBE MOD SED  07/05/2023   NISSEN FUNDOPLICATION     PLANTAR FASCIA SURGERY  2007   sigmoid colectomy  10/21/2010   pelvic anastomosis   VAGINAL HYSTERECTOMY     Patient Active Problem List   Diagnosis Date Noted   Seizure (HCC) 07/03/2023   Acute cystitis without hematuria 07/03/2023   Subdural hematoma (HCC) 06/25/2023   Midline shift of brain with brain compression (HCC) 06/25/2023   Glasgow coma scale total score 9-12 06/25/2023   Acute metabolic encephalopathy 03/01/2022   Anxiety and depression 03/01/2022   Dyslipidemia 03/01/2022   GERD  without esophagitis 03/01/2022   SIRS (systemic inflammatory response syndrome) (HCC) 02/28/2022   Gastroenteritis due to norovirus    Nausea vomiting and diarrhea    Hypomagnesemia    Hypokalemia    Hyperlipidemia    Gastroesophageal reflux disease without esophagitis    Intractable vomiting 08/08/2020   Esophagitis on CT 08/08/2020   SVT (supraventricular tachycardia) (HCC) 03/07/2019   Acute respiratory distress 11/16/2017   Acute respiratory failure (HCC) 11/16/2017   Screening for lipid disorders 07/28/2016   DOE (dyspnea on exertion) 07/02/2016   Bradycardia 07/02/2016   Anxiety 07/02/2016   Anxiety state    Atypical chest pain 03/08/2015   Fever 08/12/2010   ABDOMINAL PAIN, LEFT LOWER QUADRANT 06/23/2010   DIVERTICULITIS, ACUTE 02/25/2010   CONSTIPATION 01/22/2010   IRRITABLE BOWEL SYNDROME 01/22/2010   Primary hypertension 01/21/2010   HYPERTENSION, CONTROLLED 01/21/2010   DEEP VENOUS THROMBOPHLEBITIS 01/21/2010   ESOPHAGEAL STRICTURE 02/29/2004   Diverticulosis of colon 02/29/2004    ONSET DATE: 06/21/2023  REFERRING DIAG:  Diagnosis  I63.9 (ICD-10-CM) - CVA (cerebral vascular accident) (HCC)    THERAPY DIAG:  Muscle weakness (generalized)  Unsteadiness on feet  Difficulty in walking, not elsewhere classified  Right arm  numbness  Rationale for Evaluation and Treatment: Rehabilitation  SUBJECTIVE:                                                                                                                                                      SUBJECTIVE STATEMENT:   Pt reports no new complaints upon arrival and denies any falls.  Pt notes that her R hand is still bothering her.  Pt notes that she has been walking without the use of the rollator.  Pt also notes she wants her feeding tube out.     Pt accompanied by: With DTR [name].  PERTINENT HISTORY:    From Eval: Pt is a pleasant 73 y/o female referred to PT s/p SDH due to fall 06/21/2023. She  reports she fell in her bathroom and hit her head. She did not immediately seek care, but later developed a bad headache. She has no memory of an ambulance being called, but does remember waking up in the hospital. Pt found to have bilat subdural hematoma & reports hospital stay of around 31 days. She received inpatient PT/OT/Speech. She reports her balance is still off, was not using an AD prior to this but now uses a RW. She is not very confident in her balance. She reports no stumbles/falls since initial fall in bathroom. Pt mostly bathing on her own and dressing herself, although she does get some help from her daughter. Pt currently has a PEG tube, will be getting this removed soon. Other concerns consist of difficulty using RUE, difficulty opening things and buttoning clothes. She has RUE pain. Worst pain in past two weeks has been 10/10, lowest pain level in past two weeks has been 4/10. Pt also reports dizziness if she looks up or lays down, but states she has a long hx of vertigo of many years. Her neck can hurt when she turns her head.    Note via ED discharge:  "Brief HPI:  [name] Barrera is a 73 y.o. female with history of HTN, DVT, esophageal stricture, OSA, depression; who was admitted on 06/25/2023. She with reports of fall 3 to 4 days prior to admission with headaches and mental status changes.  She had seizure type activity in ED and was found to have a large left cerebral SDH measuring 1.1 x 1.2 cm and rightward midline shift with early uncal herniation and mass effect on left lateral ventricular system.  Eliquis reversed, she was transfused with 1 units PRBC for anemia with hemoglobin of 6 and taken to the OR emergently for craniotomy for evacuation of hematoma.  Postop required Cardene drip for BP control and had issues with pain and agitation.  Repeat CT showed no significant change.  EEG was negative for seizures.  E. coli/Citrobacter UTI treated with 5-day course of ceftriaxone she  continued to have fluctuating  mental status and and fevers with urine showing > 100,000 colonies of enterococcus felt to be contaminant.  [name] Barrera questioned need for MMA embolization pending repeat CT head in 4 weeks.  PAF managed with addition of metoprolol added for rate control and MRI brain repeated on 02/26 revealing small acute cortical infarcts in left frontal and parietal lobe. She was started on IV heparin per neurology input and Apixaban resumed on 02/28."  Please refer to chart/above for full PMH  PAIN:  Are you having pain? No not currently, occ finger numbness (R side) and RUE pain, had recent cortisone shot and this helped  PRECAUTIONS: Fall and Other: pt currently has PEG tube  RED FLAGS: Is going to see speech for swallowing/has had esophagus expansion in past    WEIGHT BEARING RESTRICTIONS: No  FALLS: Has patient fallen in last 6 months? Yes. Number of falls 1  LIVING ENVIRONMENT: Lives with: lives with their daughter Lives in: House/apartment Stairs:  5 steps with bilat handrails Has following equipment at home: Walker - 4 wheeled and shower chair  PLOF: Independent  PATIENT GOALS: Goals get back to being independent on her own. Was not living with daughter until d/c from hospital  OBJECTIVE:  Note: Objective measures were completed at Evaluation unless otherwise noted.  DIAGNOSTIC FINDINGS:  via chart  CT HEAD 07/22/23: "IMPRESSION: Expected evolution with regression of subdural hematoma on the left. No new or progressive finding.     Electronically Signed   By: Tiburcio Pea M.D.   On: 07/22/2023 08:30  "  US carotid bilat 07/11/23 "IMPRESSION: No significant stenosis of internal carotid arteries.     Electronically Signed   By: Acquanetta Belling M.D.   On: 07/11/2023 16:30"  MR brain 07/07/23: "IMPRESSION: 1. Small acute cortical infarcts within the left frontal and parietal lobes (deep to the cranioplasty). 2. Subdural hematomas overlying the  bilateral cerebral hemispheres have not significantly changed in extent. However, new from the prior brain MRI of 06/27/2023, there is apparent restricted diffusion within these collections. This may simply reflect susceptibility artifact from blood products. However, restricted diffusion can also be seen in subdural empyemas and empyemas cannot be excluded by imaging. 4 mm rightward midline shift, unchanged.     Electronically Signed   By: Jackey Loge D.O.   On: 07/07/2023 21:58"  COGNITION: Overall cognitive status: Within functional limits for tasks assessed   SENSATION: Pt reports impaired sensation RUE (dominant side) and impaired sensation in her feet occ  COORDINATION: WFL rapid alt LE, WFL heel>shin bilaterally WFL rapid alt UE bilaterally   EDEMA:  Reports she does get some swelling occ around her stomach, reports hx of digestive issues/celiac, current PEG tube, hx gallbladder surgery and other intestinal surgery?     POSTURE: rounded shoulders   LOWER EXTREMITY MMT:   Grossly 4+/5 bilat LE most deficits   RUE MMT:  BUE grossly 4 to 4+/5 - comparabale each side & no pain with testing  Grip strength 4+/5 bilat UE   TRANSFERS: Assistive device utilized: Environmental consultant - 4 wheeled  Sit to stand: SBA Stand to sit: SBA  GAIT: Gait pattern:  impaired gait speed, CGA provided or use of 4WW, observed decrease in trunk rotation Distance walked: clinic distances, 10 MWT Assistive device utilized: Walker - 4 wheeled and no AD but with close CGA  FUNCTIONAL TESTS:  5 times sit to stand: 13 sec hands-free :  0.96 m/s no AD CGA  decrease in trunk rotation SLB: <  5 sec bilat LE  Tandem stance: within 15-25 sec, BLE extremely tremulous, use of hip strategy   BERG: not assessed  DGI: not assessed     PATIENT SURVEYS:  ABC scale 47.5%                                                                                                                               TREATMENT DATE: 08/23/23   Throughout session, PT provided CGA for safety unless otherwise noted.   TE  LAQ 2x10 ea LE with 4# AW  Seated hamstring curls with GTB, 2x10 each LE  Therapeutic activities:   Ambulation with 4# AW x 1000 ft cues for picking up feet and good foot clearance.  Rear lunge x 10 reps ea LE with UE support cues for step width for optimal balance and for bending of both knees for optimal weight distribution    NMR:   Activity Description: Random tapping of pods placed in semi circle in front of pt Activity Setting:  Random Number of Pods:  6 Cycles/Sets:  1 Duration (Time or Hit Count):  30 hits  Activity Description: Random tapping of pods placed in semi circle in front of pt, R LE for Red pods, L LE for green pods Activity Setting:  Random Number of Pods:  6 Cycles/Sets:  1 Duration (Time or Hit Count):  30 hits   Activity Description: Forward ambulation to pink lit pod and return to home pod Activity Setting:  Home Base Number of Pods:  6 Cycles/Sets:  2 Duration (Time or Hit Count):  10 hits with pods at each side  Activity Description: Lateral stepping to lit pod and tapping with alternating sequence of LE's. Activity Setting:  Random Number of Pods:  6 Cycles/Sets:  1 Duration (Time or Hit Count):  20 hits       PATIENT EDUCATION: Education details: exercise technique Person educated: Patient and   Education method: Explanation Education comprehension: verbalized understanding  HOME EXERCISE PROGRAM: Access Code: [email or web address] URL: [email or web address] Date: 08/06/2023 Prepared by: Thresa Ross  Exercises - Standing Single Leg Stance with Counter Support  - 1 x daily - 7 x weekly - 2 sets - 30 sec  hold - Sit to Stand with Arms Crossed  - 1 x daily - 7 x weekly - 2 sets - 10 reps - Narrow Stance with Head Nods and Counter Support  - 1 x daily - 7 x weekly - 2 sets - 30 sec  hold  GOALS: Goals reviewed with  patient? Yes   SHORT TERM GOALS: Target date: 09/13/2023   Patient will be independent in home exercise program to improve strength/mobility for better functional independence with ADLs. Baseline: Goal status: INITIAL   LONG TERM GOALS: Target date: 10/25/2023   Patient will increase ABC scale score >80% to demonstrate better functional mobility and better confidence with ADLs.   Baseline: 47.5% Goal status: INITIAL  2.  Patient will  complete five times sit to stand test in < 10 seconds indicating an increased LE strength and improved balance. Baseline: 13 sec hands-free Goal status: INITIAL  3.  Patient will increase Berg Balance score by > 6 points to demonstrate decreased fall risk during functional activities Baseline: 47 Goal status: INITIAL  4.  Patient will increase 10 meter walk test to >1.63m/s as to improve gait speed for better community ambulation and to reduce fall risk. Baseline: 0.96 m/s Goal status: INITIAL  5.  Patient will increase dynamic gait index score to >19/24 as to demonstrate reduced fall risk and improved dynamic gait balance for better safety with community/home ambulation.   Baseline: 19 Goal status: INITIAL   ASSESSMENT:  CLINICAL IMPRESSION:  Pt responded well to the exercise and was able to tolerate increased resistance of exercises as well as utilizing blaze pods to challenge balance and stability with differing modes of ambulation (lateral, forward, retro).  Pt noted the seated exercises to be challenging and really working the [employer].  Pt encouraged to continue to perform HEP in order to improve mobility and balance with higher level mobility.    OBJECTIVE IMPAIRMENTS: Abnormal gait, decreased balance, decreased mobility, difficulty walking, decreased strength, dizziness, increased edema, impaired sensation, impaired UE functional use, improper body mechanics, postural dysfunction, and pain.   ACTIVITY LIMITATIONS: carrying, lifting, bending,  standing, squatting, stairs, transfers, dressing, and locomotion level  PARTICIPATION LIMITATIONS: meal prep, cleaning, laundry, driving, shopping, community activity, and yard work  PERSONAL FACTORS: Age, Sex, and 3+ comorbidities:  HTN, DVT, esophageal stricture, OSA, depression, please see above for extensive PMH  are also affecting patient's functional outcome.   REHAB POTENTIAL: Good  CLINICAL DECISION MAKING: Evolving/moderate complexity  EVALUATION COMPLEXITY: Moderate  PLAN:  PT FREQUENCY: 1-2x/week  PT DURATION: 12 weeks  PLANNED INTERVENTIONS: 97164- PT Re-evaluation, 97110-Therapeutic exercises, 97530- Therapeutic activity, 97112- Neuromuscular re-education, 97535- Self Care, 19147- Manual therapy, [phone]- Gait training, [phone]- Orthotic Fit/training, [phone]- Canalith repositioning, Patient/Family education, Balance training, Stair training, Taping, Joint mobilization, Spinal mobilization, Vestibular training, DME instructions, Wheelchair mobility training, Cryotherapy, and Moist heat  PLAN FOR NEXT SESSION:   Continue high level balance training.    Rozanna Corner, PT, DPT Physical Therapist - [facility]  08/23/23, 3:29 PM

## 2023-08-23 NOTE — Therapy (Signed)
 OUTPATIENT OCCUPATIONAL THERAPY NEURO TREATMENT NOTE  Patient Name: [name] MRN: [id number] DOB:[birth year], 73 y.o., female Today's Date: 08/23/2023  PCP: Firman Hughes, MD REFERRING PROVIDER: Patrisha Boot, PA-C  END OF SESSION:  OT End of Session - 08/23/23 1625     Visit Number 5    Number of Visits 24    Date for OT Re-Evaluation 10/25/23    Authorization Type Progress reporting period starting 08/02/2023    OT Start Time 1615    OT Stop Time 1700    OT Time Calculation (min) 45 min    Activity Tolerance Patient tolerated treatment well    Behavior During Therapy WFL for tasks assessed/performed            Past Medical History:  Diagnosis Date   Bradycardia    Chest pain    Constipation    Depression    Diverticulitis    Diverticulosis of colon    DOE (dyspnea on exertion)    DVT (deep venous thrombosis) (HCC)    Esophageal stricture    Hyperlipemia    Hypertension    IBS (irritable bowel syndrome)    Sleep apnea    Past Surgical History:  Procedure Laterality Date   APPENDECTOMY     BLOOD CLOT  2004   RIGHT LEG   CHOLECYSTECTOMY     COLON SURGERY     CRANIOTOMY Left 06/25/2023   Procedure: CRANIOTOMY HEMATOMA EVACUATION SUBDURAL;  Surgeon: Jodeen Munch, MD;  Location: [facility] ORS;  Service: Neurosurgery;  Laterality: Left;   GALLBLADDER SURGERY  2003   IR GASTROSTOMY TUBE MOD SED  07/05/2023   NISSEN FUNDOPLICATION     PLANTAR FASCIA SURGERY  2007   sigmoid colectomy  10/21/2010   pelvic anastomosis   VAGINAL HYSTERECTOMY     Patient Active Problem List   Diagnosis Date Noted   Seizure (HCC) 07/03/2023   Acute cystitis without hematuria 07/03/2023   Subdural hematoma (HCC) 06/25/2023   Midline shift of brain with brain compression (HCC) 06/25/2023   Glasgow coma scale total score 9-12 06/25/2023   Acute metabolic encephalopathy 03/01/2022   Anxiety and depression 03/01/2022   Dyslipidemia 03/01/2022   GERD without esophagitis  03/01/2022   SIRS (systemic inflammatory response syndrome) (HCC) 02/28/2022   Gastroenteritis due to norovirus    Nausea vomiting and diarrhea    Hypomagnesemia    Hypokalemia    Hyperlipidemia    Gastroesophageal reflux disease without esophagitis    Intractable vomiting 08/08/2020   Esophagitis on CT 08/08/2020   SVT (supraventricular tachycardia) (HCC) 03/07/2019   Acute respiratory distress 11/16/2017   Acute respiratory failure (HCC) 11/16/2017   Screening for lipid disorders 07/28/2016   DOE (dyspnea on exertion) 07/02/2016   Bradycardia 07/02/2016   Anxiety 07/02/2016   Anxiety state    Atypical chest pain 03/08/2015   Fever 08/12/2010   ABDOMINAL PAIN, LEFT LOWER QUADRANT 06/23/2010   DIVERTICULITIS, ACUTE 02/25/2010   CONSTIPATION 01/22/2010   IRRITABLE BOWEL SYNDROME 01/22/2010   Primary hypertension 01/21/2010   HYPERTENSION, CONTROLLED 01/21/2010   DEEP VENOUS THROMBOPHLEBITIS 01/21/2010   ESOPHAGEAL STRICTURE 02/29/2004   Diverticulosis of colon 02/29/2004   ONSET DATE: 06/25/2023  REFERRING DIAG: Subdural Hematoma  THERAPY DIAG:  Muscle weakness (generalized)  Other lack of coordination  Rationale for Evaluation and Treatment: Rehabilitation  SUBJECTIVE:   SUBJECTIVE STATEMENT:     Pt.'s daughter reports Pt. is starting to get her spunk back.  Pt accompanied by: daughter: Melissa  PERTINENT HISTORY: Pt.  Is a 73 y.o female who sustained a Subdural Hematoma after hitting the side of her head on a wall when reaching up into her closet. Pt. Is s/p left sided craniotomy. PMHx includes: Acute CVA, HTN, Anxiety, Depression, Dyslipidemia. Pt. Has a Hx of Right rotator cuff tear. Per MRI imaging: supraspinatus tendon tear, and biceps tendon tear. Pt. Has received a cortizone shot in the right shoulder this past week.   PRECAUTIONS: No driving  WEIGHT BEARING RESTRICTIONS: No  PAIN:  Are you having pain? 1/10 pain in right hand, wrist numbness, and tingling  pain.   FALLS: Has patient fallen in last 6 months? Yes. Number of falls 1-into the wall reaching into her closet  LIVING ENVIRONMENT: Lives with: lives alone Lives in: Mobile home Stairs: 5 steps to enter with bilateral rails Has following equipment at home: Walker - 4 wheeled and shower chair  PLOF: Independent  PATIENT GOALS: To be able to do what she wants to when she wants.  OBJECTIVE:  Note: Objective measures were completed at Evaluation unless otherwise noted.  HAND DOMINANCE: Right  ADLs:  Transfers/ambulation related to ADLs: Eating: Occasional spillage, independent using a fork for soft food. Difficulty spearing firm food, and difficulty cutting food. Difficulty holding a drink, and opening a bottle. Grooming: switches hand when opening toothpaste, uses left to brush teeth after trying with the right.  Independent with finger nails. Independent with hair care.  UB Dressing: Independent with shirt, bra. Difficulty buttoning at times, cuff buttons LB Dressing: Independent, slide on shoes Toileting: Independent Bathing:  Sink side baths, daughter assist with back Tub Shower transfers:  Has transfer tube bench, however is scared   IADLs: Shopping: Accompanied by daughter to [employer] housekeeping:  Daughter performs. Pt. Assisted with making the bed. (Daughter waiting for [place] to give the okay for Pt. 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  Uses rollator for longer  distance,  POSTURE COMMENTS:  No Significant postural limitations Sitting balance: intact  ACTIVITY TOLERANCE: Activity tolerance:  Fair  FUNCTIONAL OUTCOME MEASURES: 08/06/23: MAM-20 Neurological: 58/80  UPPER EXTREMITY ROM:    Active ROM Right eval Left Eval [facility] overall  Shoulder flexion 132(135)   Shoulder abduction 112(119)   Shoulder adduction    Shoulder extension    Shoulder internal rotation    Shoulder external rotation    Elbow flexion WFL   Elbow extension WFL   Wrist flexion    Wrist extension 30(42)   Wrist ulnar deviation    Wrist radial deviation    Wrist pronation    Wrist supination    (Blank rows = not tested)  Thumb opposition: Right: limited to the the tip of the 4th digit Left: WFL  UPPER EXTREMITY MMT:     MMT Right eval Left Eval 5/5 overall  Shoulder flexion 4-/5   Shoulder abduction 4-/5   Shoulder adduction    Shoulder extension    Shoulder internal rotation    Shoulder external rotation    Middle trapezius    Lower trapezius  Elbow flexion 4/5   Elbow extension 4/5   Wrist flexion    Wrist extension 4-/5   Wrist ulnar deviation    Wrist radial deviation    Wrist pronation    Wrist supination    (Blank rows = not tested)  HAND FUNCTION: Grip strength: Right: 5 lbs; Left: 40 lbs, Lateral pinch: Right: 10 lbs, Left: 16 lbs, and 3 point pinch: Right: 9 lbs, Left: 14 lbs  COORDINATION: 9 Hole Peg test: Right: 60 sec; Left: 23 sec  SENSATION: WFL Light touch: WFL Proprioception: WFL  Positive numbness, cold sensation in the right thumb, 3rd, and 4th digits.  EDEMA: N/A  MUSCLE TONE: Intact  COGNITION: Overall cognitive status: Within functional limits for tasks assessed  VISION: Subjective report:  Limited vision as Pt. was scheduled for bilateral cataract surgery. However due to the injury was unable to attend. No change in vision as a result of the injury  PERCEPTION: TBD  PRAXIS: Impaired:  Ideomotor   TREATMENT DATE: 08/23/2023   Paraffin Bath:   Paraffin bath to the right hand with a towel wrap for 8 min. 2/2 pain, and stiffness. Paraffin Bath was performed in preparation for manual therapy, and ROM.   Manual Therapy:  STM was performed to the volar surface of the palm, thenar eminence, thumb, and 3rd/4th digits. Carpal spread stretches were performed.  Self-care:  -Assessed simulation of cutting food using a a rocker knife, a standard table knife, as well as a standard table knife with a built-up handle using coban. -Assessed buttoning skills using a large handled buttonhook.  -Pt. was provided about the use of Dycem for stabilization of items on surfaces, and for assisting with opening bottle tops.  Neuromuscular re-education:  -Reviewed a HEP for [facility] skills.  -Right hand [facility] skills training to improve speed and dexterity needed for ADL tasks and writing. Pt. demonstrated grasping 1" sticks.   PATIENT EDUCATION: Education details: Opportunities for engaging the right hand during activity at home. Person educated: Patient and Child(ren) Education method: Explanation, Demonstration, and Verbal cues, visual handout Education comprehension: verbalized understanding, returned demonstration, verbal cues required, and needs further education  HOME EXERCISE PROGRAM: Pink theraputty  GOALS: Goals reviewed with patient? Yes  SHORT TERM GOALS: Target date: 09/13/2023  Pt. Will be independent with HEPs for the RUE, and hand. Baseline: Eval: No current HEP. Goal status: INITIAL  LONG TERM GOALS: Target date: 10/25/2023  Pt, will improve Right shoulder ROM by 10 degrees with assist rea up into cabinetry, and closets. Baseline: Eval: Right shoulder flexion: 132(135), abduction: 112(119) Goal status: INITIAL  2.  Pt. Will improve right grip strength by 5# to be able to hold a beverage securely. Baseline: Eval: R: 5#, L: 40#. Pt. Has difficulty securely holding a  beverage Goal status: INITIAL  3.  Pt. Will improve right pinch strength by 3# to be able to open a bottle. Baseline: Eval: Lateral pinch: R: 10#, L: 16#, 3 pt. pinch: R: 9#, L: 14# Goal status: INITIAL  4.  Pt. Will improve right hand [facility] skills by 5 sec. To be able to efficiently manipulate small objects for ADL/IADLs Baseline: Eval: 9 hole peg test: R: 60 sec.; Left: 23 sec. Goal status: INITIAL  5.   Pt. Will perform light home making tasks with Modified independence. Baseline: Eval: Daughter is completing home management tasks, and waiting the okay before Pt. Completes  Goal status: INITIAL  6.  Pt. Will use utensils with modified independence  Baseline:  Eval: Pt. has difficulty spearing food with a fork, and cutting food.  Goal status: INITIAL  7.  Pt will increase MAM-20 score by 10 or more points to indicate self perceived improvement in functional use of R hand for daily tasks.  Baseline: 08/06/23: 58/80  Goal status: INITIAL  ASSESSMENT:  CLINICAL IMPRESSION:  Pt. presented with 1/10 cold, tingling  pain in the right wrist, palm, thumb, 3rd & 4th digits upon arrival, however reported no pain following treatment this afternoon.  Pt. continues to tolerate the Paraffin Bath, manual therapy, and stretches well today.  Pt. continues to present with weakness, and limited right hand coordination.  Pt. required visual cues, and cues for visual demonstration of the proper technique for the button hook for buttoning, rocker knife for cutting food, and the Dycem use. Pt. presents with difficulty manipulating small 1" Purdue Pegboard sticks. Pt. was able to use the buttonhook without difficulty, as well as a rocker knife. Pt. presents with volar wrist pain when attempting to use a standard knife, and she places too much pressure through an extended 2nd digit. Pt. presented with difficulty grasping 1" sticks from the Purdue Pegboard, however was able to place them vertically in the Pegboard  once they were in her hand. Pt. occasionally required them to be slid off the edge of the pegboard with her 2nd digit to her thumb in preparation for grasping. Pt. was provided with coban to build up  handles on utensils as needed. Pt. continues to benefit from OT services to work on improving RUE functioning in order to improve engagement in, and maximize independence with ADLs, and IADL tasks.   PERFORMANCE DEFICITS: in functional skills including ADLs, IADLs, coordination, dexterity, ROM, strength, Fine motor control, Gross motor control, decreased knowledge of use of DME, and UE functional use, cognitive skills including , and psychosocial skills including coping strategies, environmental adaptation, and routines and behaviors.   IMPAIRMENTS: are limiting patient from ADLs, IADLs, and leisure.   CO-MORBIDITIES: may have co-morbidities  that affects occupational performance. Patient will benefit from skilled OT to address above impairments and improve overall function.  MODIFICATION OR ASSISTANCE TO COMPLETE EVALUATION: Min-Moderate modification of tasks or assist with assess necessary to complete an evaluation.  OT OCCUPATIONAL PROFILE AND HISTORY: Detailed assessment: Review of records and additional review of physical, cognitive, psychosocial history related to current functional performance.  CLINICAL DECISION MAKING: Moderate - several treatment options, min-mod task modification necessary  REHAB POTENTIAL: Good  EVALUATION COMPLEXITY: Moderate    PLAN:  OT FREQUENCY: 2x/week  OT DURATION: 12 weeks  PLANNED INTERVENTIONS: 97535 self care/ADL training, 84696 therapeutic exercise, 97530 therapeutic activity, 97112 neuromuscular re-education, 97140 manual therapy, 97018 paraffin, 29528 contrast bath, [id number] Cognitive training (first 15 min), 41324 Cognitive training(each additional 15 min), 40102 Orthotics management and training, 72536 Splinting (initial encounter), patient/family  education, and DME and/or AE instructions  RECOMMENDED OTHER SERVICES:   PT, ST  CONSULTED AND AGREED WITH PLAN OF CARE: Patient and family member/caregiver: Melissa  PLAN FOR NEXT SESSION: Treatment  Boen Sterbenz, MS, OTR/L    08/23/2023, 4:35 PM

## 2023-08-26 ENCOUNTER — Ambulatory Visit: Admitting: Speech Pathology

## 2023-08-26 ENCOUNTER — Ambulatory Visit: Admitting: Occupational Therapy

## 2023-08-26 ENCOUNTER — Ambulatory Visit: Admitting: Physical Therapy

## 2023-08-26 DIAGNOSIS — R262 Difficulty in walking, not elsewhere classified: Secondary | ICD-10-CM

## 2023-08-26 DIAGNOSIS — M6281 Muscle weakness (generalized): Secondary | ICD-10-CM | POA: Diagnosis not present

## 2023-08-26 DIAGNOSIS — R482 Apraxia: Secondary | ICD-10-CM

## 2023-08-26 DIAGNOSIS — R278 Other lack of coordination: Secondary | ICD-10-CM

## 2023-08-26 DIAGNOSIS — R2681 Unsteadiness on feet: Secondary | ICD-10-CM

## 2023-08-26 DIAGNOSIS — R200 Anesthesia of skin: Secondary | ICD-10-CM

## 2023-08-26 NOTE — Therapy (Signed)
 OUTPATIENT PHYSICAL THERAPY NEURO  TREATMENT   Patient Name: [name] MRN: [id number] DOB:1950/11/04, 73 y.o., female Today's Date: 08/26/2023   PCP: Melissa Penton, MD REFERRING PROVIDER: Trey Paula, PA-C  END OF SESSION:   PT End of Session - 08/26/23 1407     Visit Number 8    Number of Visits 25    Date for PT Re-Evaluation 10/25/23    Progress Note Due on Visit 10    PT Start Time 1407    PT Stop Time 1447    PT Time Calculation (min) 40 min    Equipment Utilized During Treatment Gait belt    Activity Tolerance Patient tolerated treatment well    Behavior During Therapy WFL for tasks assessed/performed                 Past Medical History:  Diagnosis Date   Bradycardia    Chest pain    Constipation    Depression    Diverticulitis    Diverticulosis of colon    DOE (dyspnea on exertion)    DVT (deep venous thrombosis) (HCC)    Esophageal stricture    Hyperlipemia    Hypertension    IBS (irritable bowel syndrome)    Sleep apnea    Past Surgical History:  Procedure Laterality Date   APPENDECTOMY     BLOOD CLOT  2004   RIGHT LEG   CHOLECYSTECTOMY     COLON SURGERY     CRANIOTOMY Left 06/25/2023   Procedure: CRANIOTOMY HEMATOMA EVACUATION SUBDURAL;  Surgeon: Melissa Night, MD;  Location: [facility] ORS;  Service: Neurosurgery;  Laterality: Left;   GALLBLADDER SURGERY  2003   IR GASTROSTOMY TUBE MOD SED  07/05/2023   NISSEN FUNDOPLICATION     PLANTAR FASCIA SURGERY  2007   sigmoid colectomy  10/21/2010   pelvic anastomosis   VAGINAL HYSTERECTOMY     Patient Active Problem List   Diagnosis Date Noted   Seizure (HCC) 07/03/2023   Acute cystitis without hematuria 07/03/2023   Subdural hematoma (HCC) 06/25/2023   Midline shift of brain with brain compression (HCC) 06/25/2023   Glasgow coma scale total score 9-12 06/25/2023   Acute metabolic encephalopathy 03/01/2022   Anxiety and depression 03/01/2022   Dyslipidemia 03/01/2022    GERD without esophagitis 03/01/2022   SIRS (systemic inflammatory response syndrome) (HCC) 02/28/2022   Gastroenteritis due to norovirus    Nausea vomiting and diarrhea    Hypomagnesemia    Hypokalemia    Hyperlipidemia    Gastroesophageal reflux disease without esophagitis    Intractable vomiting 08/08/2020   Esophagitis on CT 08/08/2020   SVT (supraventricular tachycardia) (HCC) 03/07/2019   Acute respiratory distress 11/16/2017   Acute respiratory failure (HCC) 11/16/2017   Screening for lipid disorders 07/28/2016   DOE (dyspnea on exertion) 07/02/2016   Bradycardia 07/02/2016   Anxiety 07/02/2016   Anxiety state    Atypical chest pain 03/08/2015   Fever 08/12/2010   ABDOMINAL PAIN, LEFT LOWER QUADRANT 06/23/2010   DIVERTICULITIS, ACUTE 02/25/2010   CONSTIPATION 01/22/2010   IRRITABLE BOWEL SYNDROME 01/22/2010   Primary hypertension 01/21/2010   HYPERTENSION, CONTROLLED 01/21/2010   DEEP VENOUS THROMBOPHLEBITIS 01/21/2010   ESOPHAGEAL STRICTURE 02/29/2004   Diverticulosis of colon 02/29/2004    ONSET DATE: 06/21/2023  REFERRING DIAG:  Diagnosis  I63.9 (ICD-10-CM) - CVA (cerebral vascular accident) (HCC)    THERAPY DIAG:  Muscle weakness (generalized)  Unsteadiness on feet  Difficulty in walking, not elsewhere classified  Other lack of coordination  Apraxia  Right arm numbness  Rationale for Evaluation and Treatment: Rehabilitation  SUBJECTIVE:                                                                                                                                                      SUBJECTIVE STATEMENT:   Pt reports she has been doing "good." States she has been doing "some" of her HEP, but very minimal. Reports overall she has been kind of lazy, but is focusing more on increasing her general activity levels. Denies pain, but states her R thumb, 3rd and 4th fingers are numb from a pinched nerve in her shoulder - reports had cortisone shot not that  long ago that really helped. Reports continues not to use rollator at all and feels safe.  Reports planning to have feeding tube removed tomorrow at 1:30pm.  Pt accompanied by: DTR Melissa.  PERTINENT HISTORY:    From Eval: Pt is a pleasant 73 y/o female referred to PT s/p SDH due to fall 06/21/2023. She reports she fell in her bathroom and hit her head. She did not immediately seek care, but later developed a bad headache. She has no memory of an ambulance being called, but does remember waking up in the hospital. Pt found to have bilat subdural hematoma & reports hospital stay of around 31 days. She received inpatient PT/OT/Speech. She reports her balance is still off, was not using an AD prior to this but now uses a RW. She is not very confident in her balance. She reports no stumbles/falls since initial fall in bathroom. Pt mostly bathing on her own and dressing herself, although she does get some help from her daughter. Pt currently has a PEG tube, will be getting this removed soon. Other concerns consist of difficulty using RUE, difficulty opening things and buttoning clothes. She has RUE pain. Worst pain in past two weeks has been 10/10, lowest pain level in past two weeks has been 4/10. Pt also reports dizziness if she looks up or lays down, but states she has a long hx of vertigo of many years. Her neck can hurt when she turns her head.    Note via ED discharge:  "Brief HPI:  [name] is a 73 y.o. female with history of HTN, DVT, esophageal stricture, OSA, depression; who was admitted on 06/25/2023. She with reports of fall 3 to 4 days prior to admission with headaches and mental status changes.  She had seizure type activity in ED and was found to have a large left cerebral SDH measuring 1.1 x 1.2 cm and rightward midline shift with early uncal herniation and mass effect on left lateral ventricular system.  Eliquis reversed, she was transfused with 1 units PRBC for anemia with hemoglobin of  6 and taken to the OR  emergently for craniotomy for evacuation of hematoma.  Postop required Cardene drip for BP control and had issues with pain and agitation.  Repeat CT showed no significant change.  EEG was negative for seizures.  E. coli/Citrobacter UTI treated with 5-day course of ceftriaxone she continued to have fluctuating mental status and and fevers with urine showing > 100,000 colonies of enterococcus felt to be contaminant.  Dr. Marcell Barlow questioned need for MMA embolization pending repeat CT head in 4 weeks.  PAF managed with addition of metoprolol added for rate control and MRI brain repeated on 02/26 revealing small acute cortical infarcts in left frontal and parietal lobe. She was started on IV heparin per neurology input and Apixaban resumed on 02/28."  Please refer to chart/above for full PMH  PAIN:  Are you having pain? No not currently, occ finger numbness (R side) and RUE pain, had recent cortisone shot and this helped  PRECAUTIONS: Fall and Other: pt currently has PEG tube  RED FLAGS: Is going to see speech for swallowing/has had esophagus expansion in past    WEIGHT BEARING RESTRICTIONS: No  FALLS: Has patient fallen in last 6 months? Yes. Number of falls 1  LIVING ENVIRONMENT: Lives with: lives with their daughter Lives in: House/apartment Stairs:  5 steps with bilat handrails Has following equipment at home: Walker - 4 wheeled and shower chair  PLOF: Independent  PATIENT GOALS: Goals get back to being independent on her own. Was not living with daughter until d/c from hospital  OBJECTIVE:  Note: Objective measures were completed at Evaluation unless otherwise noted.  DIAGNOSTIC FINDINGS:  via chart  CT HEAD 07/22/23: "IMPRESSION: Expected evolution with regression of subdural hematoma on the left. No new or progressive finding.     Electronically Signed   By: Tiburcio Pea M.D.   On: 07/22/2023 08:30  "  US carotid bilat 07/11/23 "IMPRESSION: No  significant stenosis of internal carotid arteries.     Electronically Signed   By: Acquanetta Belling M.D.   On: 07/11/2023 16:30"  MR brain 07/07/23: "IMPRESSION: 1. Small acute cortical infarcts within the left frontal and parietal lobes (deep to the cranioplasty). 2. Subdural hematomas overlying the bilateral cerebral hemispheres have not significantly changed in extent. However, new from the prior brain MRI of 06/27/2023, there is apparent restricted diffusion within these collections. This may simply reflect susceptibility artifact from blood products. However, restricted diffusion can also be seen in subdural empyemas and empyemas cannot be excluded by imaging. 4 mm rightward midline shift, unchanged.     Electronically Signed   By: Jackey Loge D.O.   On: 07/07/2023 21:58"  COGNITION: Overall cognitive status: Within functional limits for tasks assessed   SENSATION: Pt reports impaired sensation RUE (dominant side) and impaired sensation in her feet occ  COORDINATION: WFL rapid alt LE, WFL heel>shin bilaterally WFL rapid alt UE bilaterally   EDEMA:  Reports she does get some swelling occ around her stomach, reports hx of digestive issues/celiac, current PEG tube, hx gallbladder surgery and other intestinal surgery?     POSTURE: rounded shoulders   LOWER EXTREMITY MMT:   Grossly 4+/5 bilat LE most deficits   RUE MMT:  BUE grossly 4 to 4+/5 - comparabale each side & no pain with testing  Grip strength 4+/5 bilat UE   TRANSFERS: Assistive device utilized: Walker - 4 wheeled  Sit to stand: SBA Stand to sit: SBA  GAIT: Gait pattern:  impaired gait speed, CGA provided or use of 4WW,  observed decrease in trunk rotation Distance walked: clinic distances, 10 MWT Assistive device utilized: Walker - 4 wheeled and no AD but with close CGA  FUNCTIONAL TESTS:  5 times sit to stand: 13 sec hands-free :  0.96 m/s no AD CGA  decrease in trunk rotation SLB: <5 sec  bilat LE  Tandem stance: within 15-25 sec, BLE extremely tremulous, use of hip strategy   BERG: not assessed  DGI: not assessed     PATIENT SURVEYS:  ABC scale 47.5%                                                                                                                              TREATMENT DATE: 08/26/23   Dynamic gait training ~564ft with dynamic challenges of head rotations on verbal command, visually scanning to locate post-it notes on wall, and then progressed to backwards gait with head rotations to identify post-it notes and then to backwards vs forward gait with sudden start/stops and changes of direction on verbal command  Sudden start/stops were really challenging for pt (especially backwards) requiring up to mod A to catch posterior LOB   Dynamic gait training using agility ladder including the following:  - forward reciprocal pattern down/back x2 with CGA for safety but no instability   - had pt list fruits on 2nd rep with slight difficulty with this cognitive dual-task - 2 feet in 1 foot out, forward diagonal down/back x2 reps with CGA - greatest challenge with this was motor planning the sequence requiring max progressed to min/mod cuing  - backwards step-to progressed to reciprocal pattern with CGA/light min A for balance  - pt reports long standing hx of "vertigo" and that is bothers her when looking down during this intervention   Dynamic stepping balance and B LE functional strengthening task including:  - forward step-ups onto green step with 1x purple plate [id number] per LE and holding contralateral LE up in a "march" position to promote SLS - requirse consistent light to heavy min A for balance - added dual-task challenge to this task using 2 Blaze Pods placed on wall in front of pt to promote alternating sequence of step-ups and hand taps  - continues to require min A for balance and pt's B LEs begin to fatigue  - continues to have a sufficient challenge  sequencing this task  Pt reports she felt the interventions today were very challenging.  Reinforced education to pt on importance of performing HEP daily.   PATIENT EDUCATION: Education details: exercise technique Person educated: Patient and   Education method: Explanation Education comprehension: verbalized understanding  HOME EXERCISE PROGRAM: Access Code: [email or web address] URL: [email or web address] Date: 08/06/2023 Prepared by: Thresa Ross  Exercises - Standing Single Leg Stance with Counter Support  - 1 x daily - 7 x weekly - 2 sets - 30 sec  hold - Sit to Stand with Arms Crossed  - 1 x daily - 7 x weekly - 2 sets -  10 reps - Narrow Stance with Head Nods and Counter Support  - 1 x daily - 7 x weekly - 2 sets - 30 sec  hold    GOALS: Goals reviewed with patient? Yes   SHORT TERM GOALS: Target date: 09/13/2023   Patient will be independent in home exercise program to improve strength/mobility for better functional independence with ADLs. Baseline: Goal status: INITIAL   LONG TERM GOALS: Target date: 10/25/2023   Patient will increase ABC scale score >80% to demonstrate better functional mobility and better confidence with ADLs.   Baseline: 47.5% Goal status: INITIAL  2.  Patient will complete five times sit to stand test in < 10 seconds indicating an increased LE strength and improved balance. Baseline: 13 sec hands-free Goal status: INITIAL  3.  Patient will increase Berg Balance score by > 6 points to demonstrate decreased fall risk during functional activities Baseline: 47 Goal status: INITIAL  4.  Patient will increase 10 meter walk test to >1.50m/s as to improve gait speed for better community ambulation and to reduce fall risk. Baseline: 0.96 m/s Goal status: INITIAL  5.  Patient will increase dynamic gait index score to >19/24 as to demonstrate reduced fall risk and improved dynamic gait balance for better safety with community/home  ambulation.  Baseline: 19 Goal status: INITIAL   ASSESSMENT:  CLINICAL IMPRESSION: Pt arrived motivated to participate in therapy session. Therapy session focused on dynamic gait training with pt having increased instability with backwards gait when performing dual task head rotations vs sudden start/stops. Performed dynamic gait using agility ladder with pt continuing to have greatest challenge with backwards gait, but also having difficulty with motor sequencing various stepping patterns. Transition to dynamic stepping balance challenge with dual-task using Blaze Pods and pt continues to be challenged with sequencing/motor planning. Reinforced education on importance of performing HEP daily. Ms. Walts will benefit from further skilled PT to improve these deficits in order to increase QOL and ease/safety with ADLs.    OBJECTIVE IMPAIRMENTS: Abnormal gait, decreased balance, decreased mobility, difficulty walking, decreased strength, dizziness, increased edema, impaired sensation, impaired UE functional use, improper body mechanics, postural dysfunction, and pain.   ACTIVITY LIMITATIONS: carrying, lifting, bending, standing, squatting, stairs, transfers, dressing, and locomotion level  PARTICIPATION LIMITATIONS: meal prep, cleaning, laundry, driving, shopping, community activity, and yard work  PERSONAL FACTORS: Age, Sex, and 3+ comorbidities:  HTN, DVT, esophageal stricture, OSA, depression, please see above for extensive PMH  are also affecting patient's functional outcome.   REHAB POTENTIAL: Good  CLINICAL DECISION MAKING: Evolving/moderate complexity  EVALUATION COMPLEXITY: Moderate  PLAN:  PT FREQUENCY: 1-2x/week  PT DURATION: 12 weeks  PLANNED INTERVENTIONS: 97164- PT Re-evaluation, 97110-Therapeutic exercises, 97530- Therapeutic activity, 97112- Neuromuscular re-education, 97535- Self Care, 32440- Manual therapy, [phone]- Gait training, [phone]- Orthotic Fit/training, [phone]-  Canalith repositioning, Patient/Family education, Balance training, Stair training, Taping, Joint mobilization, Spinal mobilization, Vestibular training, DME instructions, Wheelchair mobility training, Cryotherapy, and Moist heat  PLAN FOR NEXT SESSION:  Continue high level balance training.  - backwards gait/stepping strategies  - cognitive dual task challenges  - variable motor sequencing challenges   Kennedi Lizardo, PT, DPT, NCS, CSRS Physical Therapist - [place]  [facility]  2:57 PM 08/26/23

## 2023-08-26 NOTE — Progress Notes (Signed)
 Patient for IR Gastrostomy Tube removal on Friday 08/27/23, I called and spoke with the patient on the phone and gave pre-procedure instructions. Pt was made aware to be here at 1p and check in at the new entrance Pt stated understanding.  Called 08/25/23

## 2023-08-26 NOTE — Therapy (Addendum)
 OUTPATIENT OCCUPATIONAL THERAPY NEURO TREATMENT NOTE  Patient Name: [name] MRN: [id number] DOB:05-03-1951, 73 y.o., female Today's Date: 08/26/2023  PCP: Bethann Punches, MD REFERRING PROVIDER: Jeanella Craze, PA-C  END OF SESSION:  OT End of Session - 08/26/23 1541     Visit Number 6    Number of Visits 24    Date for OT Re-Evaluation 10/25/23    Authorization Type Progress reporting period starting 08/02/2023    OT Start Time 1450    OT Stop Time 1530    OT Time Calculation (min) 40 min    Activity Tolerance Patient tolerated treatment well    Behavior During Therapy WFL for tasks assessed/performed            Past Medical History:  Diagnosis Date   Bradycardia    Chest pain    Constipation    Depression    Diverticulitis    Diverticulosis of colon    DOE (dyspnea on exertion)    DVT (deep venous thrombosis) (HCC)    Esophageal stricture    Hyperlipemia    Hypertension    IBS (irritable bowel syndrome)    Sleep apnea    Past Surgical History:  Procedure Laterality Date   APPENDECTOMY     BLOOD CLOT  2004   RIGHT LEG   CHOLECYSTECTOMY     COLON SURGERY     CRANIOTOMY Left 06/25/2023   Procedure: CRANIOTOMY HEMATOMA EVACUATION SUBDURAL;  Surgeon: Venetia Night, MD;  Location: [facility] ORS;  Service: Neurosurgery;  Laterality: Left;   GALLBLADDER SURGERY  2003   IR GASTROSTOMY TUBE MOD SED  07/05/2023   NISSEN FUNDOPLICATION     PLANTAR FASCIA SURGERY  2007   sigmoid colectomy  10/21/2010   pelvic anastomosis   VAGINAL HYSTERECTOMY     Patient Active Problem List   Diagnosis Date Noted   Seizure (HCC) 07/03/2023   Acute cystitis without hematuria 07/03/2023   Subdural hematoma (HCC) 06/25/2023   Midline shift of brain with brain compression (HCC) 06/25/2023   Glasgow coma scale total score 9-12 06/25/2023   Acute metabolic encephalopathy 03/01/2022   Anxiety and depression 03/01/2022   Dyslipidemia 03/01/2022   GERD without esophagitis  03/01/2022   SIRS (systemic inflammatory response syndrome) (HCC) 02/28/2022   Gastroenteritis due to norovirus    Nausea vomiting and diarrhea    Hypomagnesemia    Hypokalemia    Hyperlipidemia    Gastroesophageal reflux disease without esophagitis    Intractable vomiting 08/08/2020   Esophagitis on CT 08/08/2020   SVT (supraventricular tachycardia) (HCC) 03/07/2019   Acute respiratory distress 11/16/2017   Acute respiratory failure (HCC) 11/16/2017   Screening for lipid disorders 07/28/2016   DOE (dyspnea on exertion) 07/02/2016   Bradycardia 07/02/2016   Anxiety 07/02/2016   Anxiety state    Atypical chest pain 03/08/2015   Fever 08/12/2010   ABDOMINAL PAIN, LEFT LOWER QUADRANT 06/23/2010   DIVERTICULITIS, ACUTE 02/25/2010   CONSTIPATION 01/22/2010   IRRITABLE BOWEL SYNDROME 01/22/2010   Primary hypertension 01/21/2010   HYPERTENSION, CONTROLLED 01/21/2010   DEEP VENOUS THROMBOPHLEBITIS 01/21/2010   ESOPHAGEAL STRICTURE 02/29/2004   Diverticulosis of colon 02/29/2004   ONSET DATE: 06/25/2023  REFERRING DIAG: Subdural Hematoma  THERAPY DIAG:  Muscle weakness (generalized)  Rationale for Evaluation and Treatment: Rehabilitation  SUBJECTIVE:   SUBJECTIVE STATEMENT:     Pt.'s daughter reports that her right hand pain is improving Pt accompanied by: daughter: Melissa  PERTINENT HISTORY: Pt. Is a 73 y.o female who sustained  a Subdural Hematoma after hitting the side of her head on a wall when reaching up into her closet. Pt. Is s/p left sided craniotomy. PMHx includes: Acute CVA, HTN, Anxiety, Depression, Dyslipidemia. Pt. Has a Hx of Right rotator cuff tear. Per MRI imaging: supraspinatus tendon tear, and biceps tendon tear. Pt. Has received a cortizone shot in the right shoulder this past week.   PRECAUTIONS: No driving  WEIGHT BEARING RESTRICTIONS: No  PAIN:  Are you having pain? 1/10 pain in right hand, wrist numbness,  and coldness  FALLS: Has patient fallen in  last 6 months? Yes. Number of falls 1-into the wall reaching into her closet  LIVING ENVIRONMENT: Lives with: lives alone Lives in: Mobile home Stairs: 5 steps to enter with bilateral rails Has following equipment at home: Walker - 4 wheeled and shower chair  PLOF: Independent  PATIENT GOALS: To be able to do what she wants to when she wants.  OBJECTIVE:  Note: Objective measures were completed at Evaluation unless otherwise noted.  HAND DOMINANCE: Right  ADLs:  Transfers/ambulation related to ADLs: Eating: Occasional spillage, independent using a fork for soft food. Difficulty spearing firm food, and difficulty cutting food. Difficulty holding a drink, and opening a bottle. Grooming: switches hand when opening toothpaste, uses left to brush teeth after trying with the right.  Independent with finger nails. Independent with hair care.  UB Dressing: Independent with shirt, bra. Difficulty buttoning at times, cuff buttons LB Dressing: Independent, slide on shoes Toileting: Independent Bathing:  Sink side baths, daughter assist with back Tub Shower transfers:  Has transfer tube bench, however is scared   IADLs: Shopping: Accompanied by daughter to [employer] housekeeping:  Daughter performs. Pt. Assisted with making the bed. (Daughter waiting for [place] to give the okay for Pt. 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  Uses rollator for longer distance,  POSTURE COMMENTS:  No Significant  postural limitations Sitting balance: intact  ACTIVITY TOLERANCE: Activity tolerance:  Fair  FUNCTIONAL OUTCOME MEASURES: 08/06/23: MAM-20 Neurological: 58/80  UPPER EXTREMITY ROM:    Active ROM Right eval Left Eval [facility] overall  Shoulder flexion 132(135)   Shoulder abduction 112(119)   Shoulder adduction    Shoulder extension    Shoulder internal rotation    Shoulder external rotation    Elbow flexion WFL   Elbow extension WFL   Wrist flexion    Wrist extension 30(42)   Wrist ulnar deviation    Wrist radial deviation    Wrist pronation    Wrist supination    (Blank rows = not tested)  Thumb opposition: Right: limited to the the tip of the 4th digit Left: [facility]  UPPER EXTREMITY MMT:     MMT Right eval Left Eval 5/5 overall  Shoulder flexion 4-/5   Shoulder abduction 4-/5   Shoulder adduction    Shoulder extension    Shoulder internal rotation    Shoulder external rotation    Middle trapezius    Lower trapezius    Elbow flexion 4/5   Elbow extension 4/5  Wrist flexion    Wrist extension 4-/5   Wrist ulnar deviation    Wrist radial deviation    Wrist pronation    Wrist supination    (Blank rows = not tested)  HAND FUNCTION: Grip strength: Right: 5 lbs; Left: 40 lbs, Lateral pinch: Right: 10 lbs, Left: 16 lbs, and 3 point pinch: Right: 9 lbs, Left: 14 lbs  COORDINATION: 9 Hole Peg test: Right: 60 sec; Left: 23 sec  SENSATION: WFL Light touch: WFL Proprioception: WFL  Positive numbness, cold sensation in the right thumb, 3rd, and 4th digits.  EDEMA: N/A  MUSCLE TONE: Intact  COGNITION: Overall cognitive status: Within functional limits for tasks assessed  VISION: Subjective report:  Limited vision as Pt. was scheduled for bilateral cataract surgery. However due to the injury was unable to attend. No change in vision as a result of the injury  PERCEPTION: TBD  PRAXIS: Impaired: Ideomotor   TREATMENT DATE: 08/26/2023   Manual  Therapy:  STM was performed to the volar surface of the right palm, thenar eminence, thumb, and 3rd/4th digits. Carpal spread stretches were performed.  Self-care:  -Performed writing legibility, and speed using a standard pen for multiple sentences in printed form,a nd cursive form. -Assessed writing speed, and legibility completing 4 sentences in 4 min. and 10 sec. In printed form.  Neuromuscular re-education:  -Facilitated [facility] skills using tweezers for grasping small 1/8" cubes, and worked on stacking them.  -Facilitated [facility] skills/translatory movements of the hand grasping and storing the small cubes then moving them from the palm to the tip of the 2nd digit, and thumb in preparation for placing them into a narrow neck bottle.   PATIENT EDUCATION: Education details: Opportunities for engaging the right hand during activity at home. Person educated: Patient and Child(ren) Education method: Explanation, Demonstration, and Verbal cues, visual handout Education comprehension: verbalized understanding, returned demonstration, verbal cues required, and needs further education  HOME EXERCISE PROGRAM: Pink theraputty  GOALS: Goals reviewed with patient? Yes  SHORT TERM GOALS: Target date: 09/13/2023  Pt. Will be independent with HEPs for the RUE, and hand. Baseline: Eval: No current HEP. Goal status: INITIAL  LONG TERM GOALS: Target date: 10/25/2023  Pt, will improve Right shoulder ROM by 10 degrees with assist rea up into cabinetry, and closets. Baseline: Eval: Right shoulder flexion: 132(135), abduction: 112(119) Goal status: INITIAL  2.  Pt. Will improve right grip strength by 5# to be able to hold a beverage securely. Baseline: Eval: R: 5#, L: 40#. Pt. Has difficulty securely holding a beverage Goal status: INITIAL  3.  Pt. Will improve right pinch strength by 3# to be able to open a bottle. Baseline: Eval: Lateral pinch: R: 10#, L: 16#, 3 pt. pinch: R: 9#, L: 14# Goal  status: INITIAL  4.  Pt. Will improve right hand [facility] skills by 5 sec. To be able to efficiently manipulate small objects for ADL/IADLs Baseline: Eval: 9 hole peg test: R: 60 sec.; Left: 23 sec. Goal status: INITIAL  5.   Pt. Will perform light home making tasks with Modified independence. Baseline: Eval: Daughter is completing home management tasks, and waiting the okay before Pt. Completes  Goal status: INITIAL  6.  Pt. Will use utensils with modified independence  Baseline: Eval: Pt. has difficulty spearing food with a fork, and cutting food.  Goal status: INITIAL  7.  Pt will increase MAM-20 score by 10 or more points to indicate self perceived improvement in functional use of R  hand for daily tasks.  Baseline: 08/06/23: 58/80  Goal status: INITIAL  ASSESSMENT:  CLINICAL IMPRESSION:  Pt. presented with 1/10 cold, numbness pain in the right wrist, palm, thumb, 3rd & 4th digits. Pt. was able to use the tweezers to grasp the cubes from the narrow container. Pt. presented with difficulty stacking more than 2 cubes at a time with the tweezers. Pt. Was able to consistently grasp, and store the cubes, however, presented with intermittent difficulty at times moving the cubes from the palm of her hand to the tip of the 2nd digit, and thumb in preparation for placing them into a narrow neck container. Pt. often missed the 2nd digit, and moved them between the 2nd digit, and thumb. Pt. continues to benefit from OT services to work on improving RUE functioning in order to improve engagement in, and maximize independence with ADLs, and IADL tasks.   PERFORMANCE DEFICITS: in functional skills including ADLs, IADLs, coordination, dexterity, ROM, strength, Fine motor control, Gross motor control, decreased knowledge of use of DME, and UE functional use, cognitive skills including , and psychosocial skills including coping strategies, environmental adaptation, and routines and behaviors.   IMPAIRMENTS:  are limiting patient from ADLs, IADLs, and leisure.   CO-MORBIDITIES: may have co-morbidities  that affects occupational performance. Patient will benefit from skilled OT to address above impairments and improve overall function.  MODIFICATION OR ASSISTANCE TO COMPLETE EVALUATION: Min-Moderate modification of tasks or assist with assess necessary to complete an evaluation.  OT OCCUPATIONAL PROFILE AND HISTORY: Detailed assessment: Review of records and additional review of physical, cognitive, psychosocial history related to current functional performance.  CLINICAL DECISION MAKING: Moderate - several treatment options, min-mod task modification necessary  REHAB POTENTIAL: Good  EVALUATION COMPLEXITY: Moderate    PLAN:  OT FREQUENCY: 2x/week  OT DURATION: 12 weeks  PLANNED INTERVENTIONS: 97535 self care/ADL training, 91478 therapeutic exercise, 97530 therapeutic activity, 97112 neuromuscular re-education, 97140 manual therapy, 97018 paraffin, 29562 contrast bath, [id number] Cognitive training (first 15 min), 13086 Cognitive training(each additional 15 min), 57846 Orthotics management and training, 96295 Splinting (initial encounter), patient/family education, and DME and/or AE instructions  RECOMMENDED OTHER SERVICES:   PT, ST  CONSULTED AND AGREED WITH PLAN OF CARE: Patient and family member/caregiver: Melissa  PLAN FOR NEXT SESSION: Treatment  Alaa Mullally, MS, OTR/L    08/26/2023, 3:48 PM

## 2023-08-27 ENCOUNTER — Ambulatory Visit
Admission: RE | Admit: 2023-08-27 | Discharge: 2023-08-27 | Disposition: A | Discharge status: Home / Self Care | Source: Ambulatory Visit | Attending: Physical Medicine & Rehabilitation | Admitting: Physical Medicine & Rehabilitation

## 2023-08-27 DIAGNOSIS — Z431 Encounter for attention to gastrostomy: Secondary | ICD-10-CM | POA: Diagnosis present

## 2023-08-27 DIAGNOSIS — Z7901 Long term (current) use of anticoagulants: Secondary | ICD-10-CM | POA: Insufficient documentation

## 2023-08-27 DIAGNOSIS — Z931 Gastrostomy status: Secondary | ICD-10-CM

## 2023-08-27 HISTORY — PX: IR GASTROSTOMY TUBE REMOVAL: IMG5492

## 2023-08-27 MED ORDER — LIDOCAINE VISCOUS HCL 2 % MT SOLN
15.0000 mL | Freq: Once | OROMUCOSAL | Status: AC
  Administered 2023-08-27: 5 mL via OROMUCOSAL

## 2023-08-27 MED ORDER — SILVER NITRATE-POT NITRATE 75-25 % EX MISC
CUTANEOUS | Status: AC
  Filled 2023-08-27: qty 10

## 2023-08-27 MED ORDER — LIDOCAINE VISCOUS HCL 2 % MT SOLN
OROMUCOSAL | Status: AC
  Filled 2023-08-27: qty 15

## 2023-08-27 MED ORDER — SILVER NITRATE-POT NITRATE 75-25 % EX MISC: 2.0000 | Freq: Once | CUTANEOUS | Status: DC

## 2023-08-27 NOTE — Procedures (Signed)
 Interventional Radiology Procedure:   Indications: Gastrostomy tube is no longer needed  Procedure: Removal of gastrostomy tube  Findings: Successful removal of the gastrostomy tube with traction.  Bleeding granulation tissue treated with silver  nitrate sticks.  Complications: None     EBL: Minimal  Plan: Discharge to home.  Keep site covered until it heals.   [name] R. Philip, MD  Pager: [phone]

## 2023-08-30 ENCOUNTER — Ambulatory Visit: Admitting: Occupational Therapy

## 2023-08-30 ENCOUNTER — Ambulatory Visit: Admitting: Physical Therapy

## 2023-08-30 ENCOUNTER — Ambulatory Visit

## 2023-08-30 ENCOUNTER — Ambulatory Visit (HOSPITAL_COMMUNITY)

## 2023-08-30 ENCOUNTER — Ambulatory Visit: Admitting: Speech Pathology

## 2023-08-30 DIAGNOSIS — R482 Apraxia: Secondary | ICD-10-CM

## 2023-08-30 DIAGNOSIS — M6281 Muscle weakness (generalized): Secondary | ICD-10-CM

## 2023-08-30 DIAGNOSIS — R278 Other lack of coordination: Secondary | ICD-10-CM

## 2023-08-30 DIAGNOSIS — R200 Anesthesia of skin: Secondary | ICD-10-CM

## 2023-08-30 DIAGNOSIS — R2681 Unsteadiness on feet: Secondary | ICD-10-CM

## 2023-08-30 DIAGNOSIS — R262 Difficulty in walking, not elsewhere classified: Secondary | ICD-10-CM

## 2023-08-30 NOTE — Therapy (Signed)
 OUTPATIENT OCCUPATIONAL THERAPY NEURO TREATMENT NOTE  Patient Name: [name] MRN: [id number] DOB:1951-03-30, 73 y.o., female Today's Date: 08/30/2023  PCP: Firman Hughes, MD REFERRING PROVIDER: Patrisha Boot, PA-C  END OF SESSION:  OT End of Session - 08/30/23 1704     Visit Number 7    Number of Visits 24    Date for OT Re-Evaluation 10/25/23    Authorization Type Progress reporting period starting 08/02/2023    OT Start Time 1615    OT Stop Time 1700    OT Time Calculation (min) 45 min    Activity Tolerance Patient tolerated treatment well    Behavior During Therapy WFL for tasks assessed/performed            Past Medical History:  Diagnosis Date   Bradycardia    Chest pain    Constipation    Depression    Diverticulitis    Diverticulosis of colon    DOE (dyspnea on exertion)    DVT (deep venous thrombosis) (HCC)    Esophageal stricture    Hyperlipemia    Hypertension    IBS (irritable bowel syndrome)    Sleep apnea    Past Surgical History:  Procedure Laterality Date   APPENDECTOMY     BLOOD CLOT  2004   RIGHT LEG   CHOLECYSTECTOMY     COLON SURGERY     CRANIOTOMY Left 06/25/2023   Procedure: CRANIOTOMY HEMATOMA EVACUATION SUBDURAL;  Surgeon: Jodeen Munch, MD;  Location: [facility] ORS;  Service: Neurosurgery;  Laterality: Left;   GALLBLADDER SURGERY  2003   IR GASTROSTOMY TUBE MOD SED  07/05/2023   IR GASTROSTOMY TUBE REMOVAL  08/27/2023   NISSEN FUNDOPLICATION     PLANTAR FASCIA SURGERY  2007   sigmoid colectomy  10/21/2010   pelvic anastomosis   VAGINAL HYSTERECTOMY     Patient Active Problem List   Diagnosis Date Noted   Seizure (HCC) 07/03/2023   Acute cystitis without hematuria 07/03/2023   Subdural hematoma (HCC) 06/25/2023   Midline shift of brain with brain compression (HCC) 06/25/2023   Glasgow coma scale total score 9-12 06/25/2023   Acute metabolic encephalopathy 03/01/2022   Anxiety and depression 03/01/2022   Dyslipidemia  03/01/2022   GERD without esophagitis 03/01/2022   SIRS (systemic inflammatory response syndrome) (HCC) 02/28/2022   Gastroenteritis due to norovirus    Nausea vomiting and diarrhea    Hypomagnesemia    Hypokalemia    Hyperlipidemia    Gastroesophageal reflux disease without esophagitis    Intractable vomiting 08/08/2020   Esophagitis on CT 08/08/2020   SVT (supraventricular tachycardia) (HCC) 03/07/2019   Acute respiratory distress 11/16/2017   Acute respiratory failure (HCC) 11/16/2017   Screening for lipid disorders 07/28/2016   DOE (dyspnea on exertion) 07/02/2016   Bradycardia 07/02/2016   Anxiety 07/02/2016   Anxiety state    Atypical chest pain 03/08/2015   Fever 08/12/2010   ABDOMINAL PAIN, LEFT LOWER QUADRANT 06/23/2010   DIVERTICULITIS, ACUTE 02/25/2010   CONSTIPATION 01/22/2010   IRRITABLE BOWEL SYNDROME 01/22/2010   Primary hypertension 01/21/2010   HYPERTENSION, CONTROLLED 01/21/2010   DEEP VENOUS THROMBOPHLEBITIS 01/21/2010   ESOPHAGEAL STRICTURE 02/29/2004   Diverticulosis of colon 02/29/2004   ONSET DATE: 06/25/2023  REFERRING DIAG: Subdural Hematoma  THERAPY DIAG:  Muscle weakness (generalized)  Rationale for Evaluation and Treatment: Rehabilitation  SUBJECTIVE:   SUBJECTIVE STATEMENT:     Pt. reports having had a quiet weekend. Pt accompanied by: daughter: Melissa  PERTINENT HISTORY: Pt. Is a  73 y.o female who sustained a Subdural Hematoma after hitting the side of her head on a wall when reaching up into her closet. Pt. Is s/p left sided craniotomy. PMHx includes: Acute CVA, HTN, Anxiety, Depression, Dyslipidemia. Pt. Has a Hx of Right rotator cuff tear. Per MRI imaging: supraspinatus tendon tear, and biceps tendon tear. Pt. Has received a cortizone shot in the right shoulder this past week.   PRECAUTIONS: No driving  WEIGHT BEARING RESTRICTIONS: No  PAIN:  Are you having pain? Numbness cold sensation in the Right hand, and digits  FALLS: Has  patient fallen in last 6 months? Yes. Number of falls 1-into the wall reaching into her closet  LIVING ENVIRONMENT: Lives with: lives alone Lives in: Mobile home Stairs: 5 steps to enter with bilateral rails Has following equipment at home: Walker - 4 wheeled and shower chair  PLOF: Independent  PATIENT GOALS: To be able to do what she wants to when she wants.  OBJECTIVE:  Note: Objective measures were completed at Evaluation unless otherwise noted.  HAND DOMINANCE: Right  ADLs:  Transfers/ambulation related to ADLs: Eating: Occasional spillage, independent using a fork for soft food. Difficulty spearing firm food, and difficulty cutting food. Difficulty holding a drink, and opening a bottle. Grooming: switches hand when opening toothpaste, uses left to brush teeth after trying with the right.  Independent with finger nails. Independent with hair care.  UB Dressing: Independent with shirt, bra. Difficulty buttoning at times, cuff buttons LB Dressing: Independent, slide on shoes Toileting: Independent Bathing:  Sink side baths, daughter assist with back Tub Shower transfers:  Has transfer tube bench, however is scared   IADLs: Shopping: Accompanied by daughter to [employer] housekeeping:  Daughter performs. Pt. Assisted with making the bed. (Daughter waiting for us  to give the okay for Pt. 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  Uses rollator for longer distance,  POSTURE COMMENTS:  No  Significant postural limitations Sitting balance: intact  ACTIVITY TOLERANCE: Activity tolerance:  Fair  FUNCTIONAL OUTCOME MEASURES: 08/06/23: MAM-20 Neurological: 58/80  UPPER EXTREMITY ROM:    Active ROM Right eval Left Eval [facility] overall  Shoulder flexion 132(135)   Shoulder abduction 112(119)   Shoulder adduction    Shoulder extension    Shoulder internal rotation    Shoulder external rotation    Elbow flexion WFL   Elbow extension WFL   Wrist flexion    Wrist extension 30(42)   Wrist ulnar deviation    Wrist radial deviation    Wrist pronation    Wrist supination    (Blank rows = not tested)  Thumb opposition: Right: limited to the the tip of the 4th digit Left: [facility]  UPPER EXTREMITY MMT:     MMT Right eval Left Eval 5/5 overall  Shoulder flexion 4-/5   Shoulder abduction 4-/5   Shoulder adduction    Shoulder extension    Shoulder internal rotation    Shoulder external rotation    Middle trapezius    Lower trapezius    Elbow flexion 4/5  Elbow extension 4/5   Wrist flexion    Wrist extension 4-/5   Wrist ulnar deviation    Wrist radial deviation    Wrist pronation    Wrist supination    (Blank rows = not tested)  HAND FUNCTION: Grip strength: Right: 5 lbs; Left: 40 lbs, Lateral pinch: Right: 10 lbs, Left: 16 lbs, and 3 point pinch: Right: 9 lbs, Left: 14 lbs  COORDINATION: 9 Hole Peg test: Right: 60 sec; Left: 23 sec  SENSATION: WFL Light touch: WFL Proprioception: WFL  Positive numbness, cold sensation in the right thumb, 3rd, and 4th digits.  EDEMA: N/A  MUSCLE TONE: Intact  COGNITION: Overall cognitive status: Within functional limits for tasks assessed  VISION: Subjective report:  Limited vision as Pt. was scheduled for bilateral cataract surgery. However due to the injury was unable to attend. No change in vision as a result of the injury  PERCEPTION: TBD  PRAXIS: Impaired: Ideomotor   TREATMENT DATE: 08/30/2023   There.  Ex.:  -Right hand lateral, and 3pt. pinch strengthening using yellow, red, green, and blue level resistive clips   Neuromuscular re-education:  -Facilitated right hand FMC grasp for 1" resistive cubes alternating thumb opposition to the tip of the 2nd/3rd digits while the board is placed at a vertical angle to encourage wrist extension. Emphasis was placed on islolating 2nd digit extension to press them into place.  -Facilitated FMC, and translatory movements of the right hand moving objects through his hand from the palm to the tip of the 2nd digit, and thumb in preparation for discarding them from the tip of the 2nd digit, and thumb.  -Facilitated [facility] skills needed to grasp small resistive beads. Pt. worked on connecting the beads using a 3pt. pinch, and pt. Pinch grasp. Pt. worked on disconnecting the resistive beads using a lateral pinch grasp, and 3pt. pinch grasp.   -Facilitated right hand [facility] skills grasping dice, storing them, and performing translatory movements moving them through the hand form the palm to the tip of the 2nd digit, and thumb to discard them from the hand.     PATIENT EDUCATION: Education details: Opportunities for engaging the right hand during activity at home. Person educated: Patient and Child(ren) Education method: Explanation, Demonstration, and Verbal cues, visual handout Education comprehension: verbalized understanding, returned demonstration, verbal cues required, and needs further education  HOME EXERCISE PROGRAM: Pink theraputty  GOALS: Goals reviewed with patient? Yes  SHORT TERM GOALS: Target date: 09/13/2023  Pt. Will be independent with HEPs for the RUE, and hand. Baseline: Eval: No current HEP. Goal status: INITIAL  LONG TERM GOALS: Target date: 10/25/2023  Pt, will improve Right shoulder ROM by 10 degrees with assist rea up into cabinetry, and closets. Baseline: Eval: Right shoulder flexion: 132(135), abduction: 112(119) Goal status:  INITIAL  2.  Pt. Will improve right grip strength by 5# to be able to hold a beverage securely. Baseline: Eval: R: 5#, L: 40#. Pt. Has difficulty securely holding a beverage Goal status: INITIAL  3.  Pt. Will improve right pinch strength by 3# to be able to open a bottle. Baseline: Eval: Lateral pinch: R: 10#, L: 16#, 3 pt. pinch: R: 9#, L: 14# Goal status: INITIAL  4.  Pt. Will improve right hand [facility] skills by 5 sec. To be able to efficiently manipulate small objects for ADL/IADLs Baseline: Eval: 9 hole peg test: R: 60 sec.; Left: 23 sec. Goal status: INITIAL  5.   Pt. Will perform light home making  tasks with Modified independence. Baseline: Eval: Daughter is completing home management tasks, and waiting the okay before Pt. Completes  Goal status: INITIAL  6.  Pt. Will use utensils with modified independence  Baseline: Eval: Pt. has difficulty spearing food with a fork, and cutting food.  Goal status: INITIAL  7.  Pt will increase MAM-20 score by 10 or more points to indicate self perceived improvement in functional use of R hand for daily tasks.  Baseline: 08/06/23: 58/80  Goal status: INITIAL  ASSESSMENT:  CLINICAL IMPRESSION:  Pt. Is consistently using her hands more during daily ADLs, and IADL tasks at home. Pt. is improving with using her right hand for storage. During translatory movements, Pt. often misses the 2nd digit, and discards them between the 2nd digit, and thumb.  Pt. Presents with difficulty applying the appropriate amount of bilateral 3pt.pinch pressure needed to connect, and disconnect the resistive beads. Pt. presents with a cold numbness in her right palm, and digits. Pt. continues to benefit from OT services to work on improving RUE functioning in order to improve engagement in, and maximize independence with ADLs, and IADL tasks.   PERFORMANCE DEFICITS: in functional skills including ADLs, IADLs, coordination, dexterity, ROM, strength, Fine motor control, Gross  motor control, decreased knowledge of use of DME, and UE functional use, cognitive skills including , and psychosocial skills including coping strategies, environmental adaptation, and routines and behaviors.   IMPAIRMENTS: are limiting patient from ADLs, IADLs, and leisure.   CO-MORBIDITIES: may have co-morbidities  that affects occupational performance. Patient will benefit from skilled OT to address above impairments and improve overall function.  MODIFICATION OR ASSISTANCE TO COMPLETE EVALUATION: Min-Moderate modification of tasks or assist with assess necessary to complete an evaluation.  OT OCCUPATIONAL PROFILE AND HISTORY: Detailed assessment: Review of records and additional review of physical, cognitive, psychosocial history related to current functional performance.  CLINICAL DECISION MAKING: Moderate - several treatment options, min-mod task modification necessary  REHAB POTENTIAL: Good  EVALUATION COMPLEXITY: Moderate    PLAN:  OT FREQUENCY: 2x/week  OT DURATION: 12 weeks  PLANNED INTERVENTIONS: 97535 self care/ADL training, 81191 therapeutic exercise, 97530 therapeutic activity, 97112 neuromuscular re-education, 97140 manual therapy, 97018 paraffin, 47829 contrast bath, [id number] Cognitive training (first 15 min), 56213 Cognitive training(each additional 15 min), 08657 Orthotics management and training, 84696 Splinting (initial encounter), patient/family education, and DME and/or AE instructions  RECOMMENDED OTHER SERVICES:   PT, ST  CONSULTED AND AGREED WITH PLAN OF CARE: Patient and family member/caregiver: Melissa  PLAN FOR NEXT SESSION: Treatment  Graycee Greeson, MS, OTR/L    08/30/2023, 5:10 PM

## 2023-08-30 NOTE — Therapy (Signed)
 OUTPATIENT PHYSICAL THERAPY NEURO  TREATMENT   Patient Name: [name] MRN: [id number] DOB:06-28-1950, 73 y.o., female Today's Date: 08/30/2023   PCP: Sari Cunning, MD REFERRING PROVIDER: Kaleta Oregon, PA-C  END OF SESSION:   PT End of Session - 08/30/23 1542     Visit Number 9    Number of Visits 25    Date for PT Re-Evaluation 10/25/23    Progress Note Due on Visit 10    PT Start Time 1538    Equipment Utilized During Treatment Gait belt    Activity Tolerance Patient tolerated treatment well    Behavior During Therapy [facility] for tasks assessed/performed                  Past Medical History:  Diagnosis Date   Bradycardia    Chest pain    Constipation    Depression    Diverticulitis    Diverticulosis of colon    DOE (dyspnea on exertion)    DVT (deep venous thrombosis) (HCC)    Esophageal stricture    Hyperlipemia    Hypertension    IBS (irritable bowel syndrome)    Sleep apnea    Past Surgical History:  Procedure Laterality Date   APPENDECTOMY     BLOOD CLOT  2004   RIGHT LEG   CHOLECYSTECTOMY     COLON SURGERY     CRANIOTOMY Left 06/25/2023   Procedure: CRANIOTOMY HEMATOMA EVACUATION SUBDURAL;  Surgeon: Jodeen Munch, MD;  Location: [facility] ORS;  Service: Neurosurgery;  Laterality: Left;   GALLBLADDER SURGERY  2003   IR GASTROSTOMY TUBE MOD SED  07/05/2023   IR GASTROSTOMY TUBE REMOVAL  08/27/2023   NISSEN FUNDOPLICATION     PLANTAR FASCIA SURGERY  2007   sigmoid colectomy  10/21/2010   pelvic anastomosis   VAGINAL HYSTERECTOMY     Patient Active Problem List   Diagnosis Date Noted   Seizure (HCC) 07/03/2023   Acute cystitis without hematuria 07/03/2023   Subdural hematoma (HCC) 06/25/2023   Midline shift of brain with brain compression (HCC) 06/25/2023   Glasgow coma scale total score 9-12 06/25/2023   Acute metabolic encephalopathy 03/01/2022   Anxiety and depression 03/01/2022   Dyslipidemia 03/01/2022   GERD without  esophagitis 03/01/2022   SIRS (systemic inflammatory response syndrome) (HCC) 02/28/2022   Gastroenteritis due to norovirus    Nausea vomiting and diarrhea    Hypomagnesemia    Hypokalemia    Hyperlipidemia    Gastroesophageal reflux disease without esophagitis    Intractable vomiting 08/08/2020   Esophagitis on CT 08/08/2020   SVT (supraventricular tachycardia) (HCC) 03/07/2019   Acute respiratory distress 11/16/2017   Acute respiratory failure (HCC) 11/16/2017   Screening for lipid disorders 07/28/2016   DOE (dyspnea on exertion) 07/02/2016   Bradycardia 07/02/2016   Anxiety 07/02/2016   Anxiety state    Atypical chest pain 03/08/2015   Fever 08/12/2010   ABDOMINAL PAIN, LEFT LOWER QUADRANT 06/23/2010   DIVERTICULITIS, ACUTE 02/25/2010   CONSTIPATION 01/22/2010   IRRITABLE BOWEL SYNDROME 01/22/2010   Primary hypertension 01/21/2010   HYPERTENSION, CONTROLLED 01/21/2010   DEEP VENOUS THROMBOPHLEBITIS 01/21/2010   ESOPHAGEAL STRICTURE 02/29/2004   Diverticulosis of colon 02/29/2004    ONSET DATE: 06/21/2023  REFERRING DIAG:  Diagnosis  I63.9 (ICD-10-CM) - CVA (cerebral vascular accident) (HCC)    THERAPY DIAG:  Muscle weakness (generalized)  Unsteadiness on feet  Difficulty in walking, not elsewhere classified  Other lack of coordination  Apraxia  Right arm numbness  Rationale for Evaluation and Treatment: Rehabilitation  SUBJECTIVE:                                                                                                                                                      SUBJECTIVE STATEMENT:   Patient reports she is doing really well- working with laundry, vacuuming the loveseat- moving the cushions and no LOB.  Pt accompanied by: DTR Melissa.  PERTINENT HISTORY:    From Eval: Pt is a pleasant 73 y/o female referred to PT s/p SDH due to fall 06/21/2023. She reports she fell in her bathroom and hit her head. She did not immediately seek care, but  later developed a bad headache. She has no memory of an ambulance being called, but does remember waking up in the hospital. Pt found to have bilat subdural hematoma & reports hospital stay of around 31 days. She received inpatient PT/OT/Speech. She reports her balance is still off, was not using an AD prior to this but now uses a RW. She is not very confident in her balance. She reports no stumbles/falls since initial fall in bathroom. Pt mostly bathing on her own and dressing herself, although she does get some help from her daughter. Pt currently has a PEG tube, will be getting this removed soon. Other concerns consist of difficulty using RUE, difficulty opening things and buttoning clothes. She has RUE pain. Worst pain in past two weeks has been 10/10, lowest pain level in past two weeks has been 4/10. Pt also reports dizziness if she looks up or lays down, but states she has a long hx of vertigo of many years. Her neck can hurt when she turns her head.    Note via ED discharge:  "Brief HPI:  [name] is a 73 y.o. female with history of HTN, DVT, esophageal stricture, OSA, depression; who was admitted on 06/25/2023. She with reports of fall 3 to 4 days prior to admission with headaches and mental status changes.  She had seizure type activity in ED and was found to have a large left cerebral SDH measuring 1.1 x 1.2 cm and rightward midline shift with early uncal herniation and mass effect on left lateral ventricular system.  Eliquis  reversed, she was transfused with 1 units PRBC for anemia with hemoglobin of 6 and taken to the OR emergently for craniotomy for evacuation of hematoma.  Postop required Cardene  drip for BP control and had issues with pain and agitation.  Repeat CT showed no significant change.  EEG was negative for seizures.  E. coli/Citrobacter UTI treated with 5-day course of ceftriaxone  she continued to have fluctuating mental status and and fevers with urine showing > 100,000 colonies  of enterococcus felt to be contaminant.  Dr. Jeris Montes questioned need for MMA embolization pending repeat CT head  in 4 weeks.  PAF managed with addition of metoprolol  added for rate control and MRI brain repeated on 02/26 revealing small acute cortical infarcts in left frontal and parietal lobe. She was started on IV heparin  per neurology input and Apixaban  resumed on 02/28."  Please refer to chart/above for full PMH  PAIN:  Are you having pain? No not currently, occ finger numbness (R side) and RUE pain, had recent cortisone shot and this helped  PRECAUTIONS: Fall and Other: pt currently has PEG tube  RED FLAGS: Is going to see speech for swallowing/has had esophagus expansion in past    WEIGHT BEARING RESTRICTIONS: No  FALLS: Has patient fallen in last 6 months? Yes. Number of falls 1  LIVING ENVIRONMENT: Lives with: lives with their daughter Lives in: House/apartment Stairs:  5 steps with bilat handrails Has following equipment at home: Walker - 4 wheeled and shower chair  PLOF: Independent  PATIENT GOALS: Goals get back to being independent on her own. Was not living with daughter until d/c from hospital  OBJECTIVE:  Note: Objective measures were completed at Evaluation unless otherwise noted.  DIAGNOSTIC FINDINGS:  via chart  CT HEAD 07/22/23: "IMPRESSION: Expected evolution with regression of subdural hematoma on the left. No new or progressive finding.     Electronically Signed   By: Ronnette Coke M.D.   On: 07/22/2023 08:30  "  US  carotid bilat 07/11/23 "IMPRESSION: No significant stenosis of internal carotid arteries.     Electronically Signed   By: Elester Grim M.D.   On: 07/11/2023 16:30"  MR brain 07/07/23: "IMPRESSION: 1. Small acute cortical infarcts within the left frontal and parietal lobes (deep to the cranioplasty). 2. Subdural hematomas overlying the bilateral cerebral hemispheres have not significantly changed in extent. However, new from  the prior brain MRI of 06/27/2023, there is apparent restricted diffusion within these collections. This may simply reflect susceptibility artifact from blood products. However, restricted diffusion can also be seen in subdural empyemas and empyemas cannot be excluded by imaging. 4 mm rightward midline shift, unchanged.     Electronically Signed   By: Bascom Lily D.O.   On: 07/07/2023 21:58"  COGNITION: Overall cognitive status: Within functional limits for tasks assessed   SENSATION: Pt reports impaired sensation RUE (dominant side) and impaired sensation in her feet occ  COORDINATION: WFL rapid alt LE, WFL heel>shin bilaterally WFL rapid alt UE bilaterally   EDEMA:  Reports she does get some swelling occ around her stomach, reports hx of digestive issues/celiac, current PEG tube, hx gallbladder surgery and other intestinal surgery?     POSTURE: rounded shoulders   LOWER EXTREMITY MMT:   Grossly 4+/5 bilat LE most deficits   RUE MMT:  BUE grossly 4 to 4+/5 - comparabale each side & no pain with testing  Grip strength 4+/5 bilat UE   TRANSFERS: Assistive device utilized: Environmental consultant - 4 wheeled  Sit to stand: SBA Stand to sit: SBA  GAIT: Gait pattern:  impaired gait speed, CGA provided or use of 4WW, observed decrease in trunk rotation Distance walked: clinic distances, 10 MWT Assistive device utilized: Walker - 4 wheeled and no AD but with close CGA  FUNCTIONAL TESTS:  5 times sit to stand: 13 sec hands-free :  0.96 m/s no AD CGA  decrease in trunk rotation SLB: <5 sec bilat LE  Tandem stance: within 15-25 sec, BLE extremely tremulous, use of hip strategy   BERG: not assessed  DGI: not assessed  PATIENT SURVEYS:  ABC scale 47.5%                                                                                                                              TREATMENT DATE: 08/30/23  TA:  Dynamic gait training over 2000' ft with dynamic challenges of  visually scanning to locate where she was going as well as worked on [employer]- as PT guided the 1st half of trip and patient had to recall her way back- Performing quick stops and adjustment- no LOB during entire walk and good overall short term recall of directions.  Sit to stand x 15 reps without UE support - (no difficulty)   Dynamic step tap onto 1st step without UE support - alt LE x 20 Dynamic Step tap onto 2nd step - without UE support - alt LE x 15  Self care/Home management:  Discussed patient question of potentially using a product such as life alert if she returns home alone in [place]. Discussed options involving life alert and watch options like apple/google watch. Patient's dtr. Melissa states she will look into options.  PATIENT EDUCATION: Education details: exercise technique Person educated: Patient and   Education method: Explanation Education comprehension: verbalized understanding  HOME EXERCISE PROGRAM: Access Code: [email or web address] URL: [email or web address] Date: 08/06/2023 Prepared by: Marlynn Singer  Exercises - Standing Single Leg Stance with Counter Support  - 1 x daily - 7 x weekly - 2 sets - 30 sec  hold - Sit to Stand with Arms Crossed  - 1 x daily - 7 x weekly - 2 sets - 10 reps - Narrow Stance with Head Nods and Counter Support  - 1 x daily - 7 x weekly - 2 sets - 30 sec  hold    GOALS: Goals reviewed with patient? Yes   SHORT TERM GOALS: Target date: 09/13/2023   Patient will be independent in home exercise program to improve strength/mobility for better functional independence with ADLs. Baseline: Goal status: INITIAL   LONG TERM GOALS: Target date: 10/25/2023   Patient will increase ABC scale score >80% to demonstrate better functional mobility and better confidence with ADLs.   Baseline: 47.5% Goal status: INITIAL  2.  Patient will complete five times sit to stand test in < 10 seconds indicating an increased LE strength and improved  balance. Baseline: 13 sec hands-free Goal status: INITIAL  3.  Patient will increase Berg Balance score by > 6 points to demonstrate decreased fall risk during functional activities Baseline: 47 Goal status: INITIAL  4.  Patient will increase 10 meter walk test to >1.7m/s as to improve gait speed for better community ambulation and to reduce fall risk. Baseline: 0.96 m/s Goal status: INITIAL  5.  Patient will increase dynamic gait index score to >19/24 as to demonstrate reduced fall risk and improved dynamic gait balance for better safety with community/home ambulation.  Baseline: 19 Goal status: INITIAL   ASSESSMENT:  CLINICAL IMPRESSION: Patient  arrived with good motivation for today's session. She was able to demo much improved overall dynamic balance- walking on lower level of hospital- performing horizontal and vertical head motions without LOB or any limitations with endurance. Patient exhibited good short term recall of directions and able to navigate through hospital well today. Patient is progressing quickly overall- may benefit from reassessment next visit and plan for potential discharge vs. Continuing PT based on results.  OBJECTIVE IMPAIRMENTS: Abnormal gait, decreased balance, decreased mobility, difficulty walking, decreased strength, dizziness, increased edema, impaired sensation, impaired UE functional use, improper body mechanics, postural dysfunction, and pain.   ACTIVITY LIMITATIONS: carrying, lifting, bending, standing, squatting, stairs, transfers, dressing, and locomotion level  PARTICIPATION LIMITATIONS: meal prep, cleaning, laundry, driving, shopping, community activity, and yard work  PERSONAL FACTORS: Age, Sex, and 3+ comorbidities:  HTN, DVT, esophageal stricture, OSA, depression, please see above for extensive PMH  are also affecting patient's functional outcome.   REHAB POTENTIAL: Good  CLINICAL DECISION MAKING: Evolving/moderate complexity  EVALUATION  COMPLEXITY: Moderate  PLAN:  PT FREQUENCY: 1-2x/week  PT DURATION: 12 weeks  PLANNED INTERVENTIONS: 97164- PT Re-evaluation, 97110-Therapeutic exercises, 97530- Therapeutic activity, 97112- Neuromuscular re-education, 97535- Self Care, 16109- Manual therapy, [phone]- Gait training, [phone]- Orthotic Fit/training, [phone]- Canalith repositioning, Patient/Family education, Balance training, Stair training, Taping, Joint mobilization, Spinal mobilization, Vestibular training, DME instructions, Wheelchair mobility training, Cryotherapy, and Moist heat  PLAN FOR NEXT SESSION:    Reassess goals next session and based on results - may D/C if appropriate.    Physical Therapist - [facility]  [facility]  4:48 PM 08/30/23

## 2023-08-31 ENCOUNTER — Ambulatory Visit

## 2023-08-31 ENCOUNTER — Ambulatory Visit: Admitting: Occupational Therapy

## 2023-09-01 ENCOUNTER — Ambulatory Visit: Admitting: Occupational Therapy

## 2023-09-01 ENCOUNTER — Ambulatory Visit: Admitting: Physical Therapy

## 2023-09-01 ENCOUNTER — Encounter: Admitting: Occupational Therapy

## 2023-09-01 ENCOUNTER — Ambulatory Visit: Admitting: Speech Pathology

## 2023-09-02 ENCOUNTER — Encounter: Admitting: Occupational Therapy

## 2023-09-02 ENCOUNTER — Ambulatory Visit: Admitting: Occupational Therapy

## 2023-09-02 ENCOUNTER — Ambulatory Visit: Admitting: Physical Therapy

## 2023-09-02 DIAGNOSIS — M6281 Muscle weakness (generalized): Secondary | ICD-10-CM

## 2023-09-02 DIAGNOSIS — R262 Difficulty in walking, not elsewhere classified: Secondary | ICD-10-CM

## 2023-09-02 DIAGNOSIS — R2681 Unsteadiness on feet: Secondary | ICD-10-CM

## 2023-09-02 NOTE — Therapy (Signed)
 OUTPATIENT OCCUPATIONAL THERAPY NEURO TREATMENT/DISCHARGE NOTE  Patient Name: [name] MRN: [id number] DOB:07/26/1950, 73 y.o., female Today's Date: 09/02/2023  PCP: Firman Hughes, MD REFERRING PROVIDER: Patrisha Boot, PA-C  END OF SESSION:  OT End of Session - 09/02/23 1531     Visit Number 8    Number of Visits 24    Date for OT Re-Evaluation 10/25/23    Authorization Type Progress reporting period starting 08/02/2023    OT Start Time 1445    OT Stop Time 1523    OT Time Calculation (min) 38 min    Activity Tolerance Patient tolerated treatment well    Behavior During Therapy WFL for tasks assessed/performed             Past Medical History:  Diagnosis Date   Bradycardia    Chest pain    Constipation    Depression    Diverticulitis    Diverticulosis of colon    DOE (dyspnea on exertion)    DVT (deep venous thrombosis) (HCC)    Esophageal stricture    Hyperlipemia    Hypertension    IBS (irritable bowel syndrome)    Sleep apnea    Past Surgical History:  Procedure Laterality Date   APPENDECTOMY     BLOOD CLOT  2004   RIGHT LEG   CHOLECYSTECTOMY     COLON SURGERY     CRANIOTOMY Left 06/25/2023   Procedure: CRANIOTOMY HEMATOMA EVACUATION SUBDURAL;  Surgeon: Jodeen Munch, MD;  Location: [facility] ORS;  Service: Neurosurgery;  Laterality: Left;   GALLBLADDER SURGERY  2003   IR GASTROSTOMY TUBE MOD SED  07/05/2023   IR GASTROSTOMY TUBE REMOVAL  08/27/2023   NISSEN FUNDOPLICATION     PLANTAR FASCIA SURGERY  2007   sigmoid colectomy  10/21/2010   pelvic anastomosis   VAGINAL HYSTERECTOMY     Patient Active Problem List   Diagnosis Date Noted   Seizure (HCC) 07/03/2023   Acute cystitis without hematuria 07/03/2023   Subdural hematoma (HCC) 06/25/2023   Midline shift of brain with brain compression (HCC) 06/25/2023   Glasgow coma scale total score 9-12 06/25/2023   Acute metabolic encephalopathy 03/01/2022   Anxiety and depression 03/01/2022    Dyslipidemia 03/01/2022   GERD without esophagitis 03/01/2022   SIRS (systemic inflammatory response syndrome) (HCC) 02/28/2022   Gastroenteritis due to norovirus    Nausea vomiting and diarrhea    Hypomagnesemia    Hypokalemia    Hyperlipidemia    Gastroesophageal reflux disease without esophagitis    Intractable vomiting 08/08/2020   Esophagitis on CT 08/08/2020   SVT (supraventricular tachycardia) (HCC) 03/07/2019   Acute respiratory distress 11/16/2017   Acute respiratory failure (HCC) 11/16/2017   Screening for lipid disorders 07/28/2016   DOE (dyspnea on exertion) 07/02/2016   Bradycardia 07/02/2016   Anxiety 07/02/2016   Anxiety state    Atypical chest pain 03/08/2015   Fever 08/12/2010   ABDOMINAL PAIN, LEFT LOWER QUADRANT 06/23/2010   DIVERTICULITIS, ACUTE 02/25/2010   CONSTIPATION 01/22/2010   IRRITABLE BOWEL SYNDROME 01/22/2010   Primary hypertension 01/21/2010   HYPERTENSION, CONTROLLED 01/21/2010   DEEP VENOUS THROMBOPHLEBITIS 01/21/2010   ESOPHAGEAL STRICTURE 02/29/2004   Diverticulosis of colon 02/29/2004   ONSET DATE: 06/25/2023  REFERRING DIAG: Subdural Hematoma  THERAPY DIAG:  Muscle weakness (generalized)  Rationale for Evaluation and Treatment: Rehabilitation  SUBJECTIVE:   SUBJECTIVE STATEMENT:     Pt. reports having had a quiet weekend. Pt accompanied by: daughter: Melissa  PERTINENT HISTORY: Pt. Is  a 73 y.o female who sustained a Subdural Hematoma after hitting the side of her head on a wall when reaching up into her closet. Pt. Is s/p left sided craniotomy. PMHx includes: Acute CVA, HTN, Anxiety, Depression, Dyslipidemia. Pt. Has a Hx of Right rotator cuff tear. Per MRI imaging: supraspinatus tendon tear, and biceps tendon tear. Pt. Has received a cortizone shot in the right shoulder this past week.   PRECAUTIONS: No driving  WEIGHT BEARING RESTRICTIONS: No  PAIN:  Are you having pain? Numbness cold sensation in the Right hand, and  digits  FALLS: Has patient fallen in last 6 months? Yes. Number of falls 1-into the wall reaching into her closet  LIVING ENVIRONMENT: Lives with: lives alone Lives in: Mobile home Stairs: 5 steps to enter with bilateral rails Has following equipment at home: Walker - 4 wheeled and shower chair  PLOF: Independent  PATIENT GOALS: To be able to do what she wants to when she wants.  OBJECTIVE:  Note: Objective measures were completed at Evaluation unless otherwise noted.  HAND DOMINANCE: Right  ADLs:  Transfers/ambulation related to ADLs: Eating: Occasional spillage, independent using a fork for soft food. Difficulty spearing firm food, and difficulty cutting food. Difficulty holding a drink, and opening a bottle. Grooming: switches hand when opening toothpaste, uses left to brush teeth after trying with the right.  Independent with finger nails. Independent with hair care.  UB Dressing: Independent with shirt, bra. Difficulty buttoning at times, cuff buttons LB Dressing: Independent, slide on shoes Toileting: Independent Bathing:  Sink side baths, daughter assist with back Tub Shower transfers:  Has transfer tube bench, however is scared   IADLs: Shopping: Accompanied by daughter to [employer] housekeeping:  Daughter performs. Pt. Assisted with making the bed. (Daughter waiting for us  to give the okay for Pt. 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  Uses rollator for longer  distance,  POSTURE COMMENTS:  No Significant postural limitations Sitting balance: intact  ACTIVITY TOLERANCE: Activity tolerance:  Fair  FUNCTIONAL OUTCOME MEASURES: 08/06/23: MAM-20 Neurological: 58/80  UPPER EXTREMITY ROM:    Active ROM Right eval Right 09/02/23 Left Eval [facility] overall  Shoulder flexion 132(135) 138(144)   Shoulder abduction 112(119) 124(130)   Shoulder adduction     Shoulder extension     Shoulder internal rotation     Shoulder external rotation     Elbow flexion WFL    Elbow extension WFL    Wrist flexion     Wrist extension 30(42) 46(52)   Wrist ulnar deviation     Wrist radial deviation     Wrist pronation     Wrist supination     (Blank rows = not tested)  Thumb opposition: Right: limited to the the tip of the 4th digit Left: [facility]  UPPER EXTREMITY MMT:     MMT Right eval Right 09/02/2023 Left Eval 5/5 overall  Shoulder flexion 4-/5 4+/5   Shoulder abduction 4-/5 4+/5   Shoulder adduction     Shoulder extension     Shoulder internal rotation  Shoulder external rotation     Middle trapezius     Lower trapezius     Elbow flexion 4/5 4+/5   Elbow extension 4/5 4+/5   Wrist flexion     Wrist extension 4-/5 4+/5   Wrist ulnar deviation     Wrist radial deviation     Wrist pronation     Wrist supination     (Blank rows = not tested)  HAND FUNCTION: Grip strength: Right: 5 lbs; Left: 40 lbs, Lateral pinch: Right: 10 lbs, Left: 16 lbs, and 3 point pinch: Right: 9 lbs, Left: 14 lbs  Grip strength: Right: 23 lbs; Left: 40 lbs, Lateral pinch: Right: 10 lbs, Left: 16 lbs, and 3 point pinch: Right: 9 lbs, Left: 14 lbs  COORDINATION: 9 Hole Peg test: Right: 60 sec; Left: 23 sec  9 Hole Peg test: Right: 1 min. & 43 sec; Left: 23 sec   SENSATION: WFL Light touch: WFL Proprioception: WFL  Positive numbness, cold sensation in the right thumb, 3rd, and 4th digits.  EDEMA: N/A  MUSCLE TONE: Intact  COGNITION: Overall cognitive  status: Within functional limits for tasks assessed  VISION: Subjective report:  Limited vision as Pt. was scheduled for bilateral cataract surgery. However due to the injury was unable to attend. No change in vision as a result of the injury  PERCEPTION: TBD  PRAXIS: Impaired: Ideomotor   TREATMENT DATE: 09/02/2023   Measurements were obtained,a nd goals were reviewed with the Pt.     PATIENT EDUCATION: Education details: Opportunities for engaging the right hand during activity at home. Person educated: Patient and Child(ren) Education method: Explanation, Demonstration, and Verbal cues, visual handout Education comprehension: verbalized understanding, returned demonstration, verbal cues required, and needs further education  HOME EXERCISE PROGRAM: Pink theraputty  GOALS: Goals reviewed with patient? Yes  SHORT TERM GOALS: Target date: 09/13/2023  Pt. Will be independent with HEPs for the RUE, and hand. Baseline: 09/02/23: Independent Eval: No current HEP. Goal status: Achieved  LONG TERM GOALS: Target date: 10/25/2023  Pt, will improve Right shoulder ROM by 10 degrees with assist rea up into cabinetry, and closets. Baseline: 09/02/2023: Right shoulder flexion: 138(144), abduction: 124(130) Eval: Right shoulder flexion: 132(135), abduction: 112(119) Goal status:  Achieved  2.  Pt. Will improve right grip strength by 5# to be able to hold a beverage securely. Baseline: 09/02/23: Eval: R: 23#, L: 40# Eval: R: 5#, L: 40#. Pt. Has difficulty securely holding a beverage Goal status: Achieved  3.  Pt. Will improve right pinch strength by 3# to be able to open a bottle. Baseline: 09/02/23: Pt. Is able to open a bottle Eval: Lateral pinch: R: 10#, L: 16#, 3 pt. pinch: R: 9#, L: 14# Goal status: Achieved  4.  Pt. Will improve right hand [facility] skills by 5 sec. To be able to efficiently manipulate small objects for ADL/IADLs Baseline: 09/02/23: R: 1 min. & 43 sec. Pt. Is able to  efficiently manipulate small objects when she is not having episodes of cold numbness in her right hand. When having episodes of cold numbness pain Pt. has increased difficulty grasping/manipulating small objects. Eval: 9 hole peg test: R: 60 sec.; Left: 23 sec. Goal status: Partially met  5.   Pt. Will perform light home making tasks with Modified independence. Baseline: 09/02/23: Pt. is completing home management tasks independently. Eval: Daughter is completing home management tasks, and waiting the okay before Pt. Completes  Goal status: Achieved  6.  Pt. Will use  utensils with modified independence  Baseline: 09/02/23: Independent using a fork. Eval: Pt. has difficulty spearing food with a fork, and cutting food.  Goal status: Achieved  7.  Pt will increase MAM-20 score by 10 or more points to indicate self perceived improvement in functional use of R hand for daily tasks.  Baseline: 09/02/23: 69/80 08/06/23: 58/80  Goal status:  Achieved  ASSESSMENT:  CLINICAL IMPRESSION:  Pt. has made progress, and has exceeded most goals. Pt. has progressed with RUE ROM, strength, grip strength, and  pinch strength. Pt. Is consistently using her hands more during daily ADLs, and IADL tasks at home. Pt. presents with a cold numbness in her right palm, and digits which limits her ability to efficiently grasp and manipulate small objects. Pt. Is able to manipulate small objects more efficiently when Pt. does not have  episodes of the cold numbness. MAM-20 score improved to 69/80. Pt. is independent with HEPs. Pt. is ready to discharge from OT services at this time.    PERFORMANCE DEFICITS: in functional skills including ADLs, IADLs, coordination, dexterity, ROM, strength, Fine motor control, Gross motor control, decreased knowledge of use of DME, and UE functional use, cognitive skills including , and psychosocial skills including coping strategies, environmental adaptation, and routines and behaviors.    IMPAIRMENTS: are limiting patient from ADLs, IADLs, and leisure.   CO-MORBIDITIES: may have co-morbidities  that affects occupational performance. Patient will benefit from skilled OT to address above impairments and improve overall function.  MODIFICATION OR ASSISTANCE TO COMPLETE EVALUATION: Min-Moderate modification of tasks or assist with assess necessary to complete an evaluation.  OT OCCUPATIONAL PROFILE AND HISTORY: Detailed assessment: Review of records and additional review of physical, cognitive, psychosocial history related to current functional performance.  CLINICAL DECISION MAKING: Moderate - several treatment options, min-mod task modification necessary  REHAB POTENTIAL: Good  EVALUATION COMPLEXITY: Moderate    PLAN:  OT FREQUENCY: 2x/week  OT DURATION: 12 weeks  PLANNED INTERVENTIONS: 97535 self care/ADL training, 16109 therapeutic exercise, 97530 therapeutic activity, 97112 neuromuscular re-education, 97140 manual therapy, 97018 paraffin, 60454 contrast bath, [id number] Cognitive training (first 15 min), 09811 Cognitive training(each additional 15 min), 91478 Orthotics management and training, 29562 Splinting (initial encounter), patient/family education, and DME and/or AE instructions  RECOMMENDED OTHER SERVICES:   PT, ST  CONSULTED AND AGREED WITH PLAN OF CARE: Patient and family member/caregiver: Melissa  PLAN FOR NEXT SESSION: Treatment  Normand Damron, MS, OTR/L    09/02/2023, 3:35 PM

## 2023-09-02 NOTE — Therapy (Signed)
 OUTPATIENT PHYSICAL THERAPY NEURO  / Physical Therapy Progress Note   Dates of reporting period  08/02/23   to   09/02/23    Patient Name: [name] MRN: [id number] DOB:01-09-1951, 73 y.o., female Today's Date: 09/02/2023   PCP: Sari Cunning, MD REFERRING PROVIDER: Kaleta Oregon, PA-C  END OF SESSION:   PT End of Session - 09/02/23 1528     Visit Number 10    Number of Visits 25    Date for PT Re-Evaluation 10/25/23    Progress Note Due on Visit 10    PT Start Time 1530    PT Stop Time 1608    PT Time Calculation (min) 38 min    Equipment Utilized During Treatment Gait belt    Activity Tolerance Patient tolerated treatment well    Behavior During Therapy WFL for tasks assessed/performed                   Past Medical History:  Diagnosis Date   Bradycardia    Chest pain    Constipation    Depression    Diverticulitis    Diverticulosis of colon    DOE (dyspnea on exertion)    DVT (deep venous thrombosis) (HCC)    Esophageal stricture    Hyperlipemia    Hypertension    IBS (irritable bowel syndrome)    Sleep apnea    Past Surgical History:  Procedure Laterality Date   APPENDECTOMY     BLOOD CLOT  2004   RIGHT LEG   CHOLECYSTECTOMY     COLON SURGERY     CRANIOTOMY Left 06/25/2023   Procedure: CRANIOTOMY HEMATOMA EVACUATION SUBDURAL;  Surgeon: Jodeen Munch, MD;  Location: [facility] ORS;  Service: Neurosurgery;  Laterality: Left;   GALLBLADDER SURGERY  2003   IR GASTROSTOMY TUBE MOD SED  07/05/2023   IR GASTROSTOMY TUBE REMOVAL  08/27/2023   NISSEN FUNDOPLICATION     PLANTAR FASCIA SURGERY  2007   sigmoid colectomy  10/21/2010   pelvic anastomosis   VAGINAL HYSTERECTOMY     Patient Active Problem List   Diagnosis Date Noted   Seizure (HCC) 07/03/2023   Acute cystitis without hematuria 07/03/2023   Subdural hematoma (HCC) 06/25/2023   Midline shift of brain with brain compression (HCC) 06/25/2023   Glasgow coma scale total score  9-12 06/25/2023   Acute metabolic encephalopathy 03/01/2022   Anxiety and depression 03/01/2022   Dyslipidemia 03/01/2022   GERD without esophagitis 03/01/2022   SIRS (systemic inflammatory response syndrome) (HCC) 02/28/2022   Gastroenteritis due to norovirus    Nausea vomiting and diarrhea    Hypomagnesemia    Hypokalemia    Hyperlipidemia    Gastroesophageal reflux disease without esophagitis    Intractable vomiting 08/08/2020   Esophagitis on CT 08/08/2020   SVT (supraventricular tachycardia) (HCC) 03/07/2019   Acute respiratory distress 11/16/2017   Acute respiratory failure (HCC) 11/16/2017   Screening for lipid disorders 07/28/2016   DOE (dyspnea on exertion) 07/02/2016   Bradycardia 07/02/2016   Anxiety 07/02/2016   Anxiety state    Atypical chest pain 03/08/2015   Fever 08/12/2010   ABDOMINAL PAIN, LEFT LOWER QUADRANT 06/23/2010   DIVERTICULITIS, ACUTE 02/25/2010   CONSTIPATION 01/22/2010   IRRITABLE BOWEL SYNDROME 01/22/2010   Primary hypertension 01/21/2010   HYPERTENSION, CONTROLLED 01/21/2010   DEEP VENOUS THROMBOPHLEBITIS 01/21/2010   ESOPHAGEAL STRICTURE 02/29/2004   Diverticulosis of colon 02/29/2004    ONSET DATE: 06/21/2023  REFERRING DIAG:  Diagnosis  I63.9 (ICD-10-CM) - CVA (cerebral vascular accident) (HCC)    THERAPY DIAG:  No diagnosis found.  Rationale for Evaluation and Treatment: Rehabilitation  SUBJECTIVE:                                                                                                                                                      SUBJECTIVE STATEMENT:   Pt reports doing well, was just discharged form OT for good progress.  Hopes she is able to progress enough with physical therapy to discharge this date.  Pt accompanied by: DTR Melissa.  PERTINENT HISTORY:    From Eval: Pt is a pleasant 73 y/o female referred to PT s/p SDH due to fall 06/21/2023. She reports she fell in her bathroom and hit her head. She did not  immediately seek care, but later developed a bad headache. She has no memory of an ambulance being called, but does remember waking up in the hospital. Pt found to have bilat subdural hematoma & reports hospital stay of around 31 days. She received inpatient PT/OT/Speech. She reports her balance is still off, was not using an AD prior to this but now uses a RW. She is not very confident in her balance. She reports no stumbles/falls since initial fall in bathroom. Pt mostly bathing on her own and dressing herself, although she does get some help from her daughter. Pt currently has a PEG tube, will be getting this removed soon. Other concerns consist of difficulty using RUE, difficulty opening things and buttoning clothes. She has RUE pain. Worst pain in past two weeks has been 10/10, lowest pain level in past two weeks has been 4/10. Pt also reports dizziness if she looks up or lays down, but states she has a long hx of vertigo of many years. Her neck can hurt when she turns her head.    Note via ED discharge:  "Brief HPI:  [name] is a 73 y.o. female with history of HTN, DVT, esophageal stricture, OSA, depression; who was admitted on 06/25/2023. She with reports of fall 3 to 4 days prior to admission with headaches and mental status changes.  She had seizure type activity in ED and was found to have a large left cerebral SDH measuring 1.1 x 1.2 cm and rightward midline shift with early uncal herniation and mass effect on left lateral ventricular system.  Eliquis  reversed, she was transfused with 1 units PRBC for anemia with hemoglobin of 6 and taken to the OR emergently for craniotomy for evacuation of hematoma.  Postop required Cardene  drip for BP control and had issues with pain and agitation.  Repeat CT showed no significant change.  EEG was negative for seizures.  E. coli/Citrobacter UTI treated with 5-day course of ceftriaxone  she continued to have fluctuating mental status and and fevers with urine  showing > 100,000 colonies of enterococcus felt to be contaminant.  Dr. Jeris Montes questioned need for MMA embolization pending repeat CT head in 4 weeks.  PAF managed with addition of metoprolol  added for rate control and MRI brain repeated on 02/26 revealing small acute cortical infarcts in left frontal and parietal lobe. She was started on IV heparin  per neurology input and Apixaban  resumed on 02/28."  Please refer to chart/above for full PMH  PAIN:  Are you having pain? No not currently, occ finger numbness (R side) and RUE pain, had recent cortisone shot and this helped  PRECAUTIONS: Fall and Other: pt currently has PEG tube  RED FLAGS: Is going to see speech for swallowing/has had esophagus expansion in past    WEIGHT BEARING RESTRICTIONS: No  FALLS: Has patient fallen in last 6 months? Yes. Number of falls 1  LIVING ENVIRONMENT: Lives with: lives with their daughter Lives in: House/apartment Stairs:  5 steps with bilat handrails Has following equipment at home: Walker - 4 wheeled and shower chair  PLOF: Independent  PATIENT GOALS: Goals get back to being independent on her own. Was not living with daughter until d/c from hospital  OBJECTIVE:  Note: Objective measures were completed at Evaluation unless otherwise noted.  DIAGNOSTIC FINDINGS:  via chart  CT HEAD 07/22/23: "IMPRESSION: Expected evolution with regression of subdural hematoma on the left. No new or progressive finding.     Electronically Signed   By: Ronnette Coke M.D.   On: 07/22/2023 08:30  "  US  carotid bilat 07/11/23 "IMPRESSION: No significant stenosis of internal carotid arteries.     Electronically Signed   By: Elester Grim M.D.   On: 07/11/2023 16:30"  MR brain 07/07/23: "IMPRESSION: 1. Small acute cortical infarcts within the left frontal and parietal lobes (deep to the cranioplasty). 2. Subdural hematomas overlying the bilateral cerebral hemispheres have not significantly changed in  extent. However, new from the prior brain MRI of 06/27/2023, there is apparent restricted diffusion within these collections. This may simply reflect susceptibility artifact from blood products. However, restricted diffusion can also be seen in subdural empyemas and empyemas cannot be excluded by imaging. 4 mm rightward midline shift, unchanged.     Electronically Signed   By: Bascom Lily D.O.   On: 07/07/2023 21:58"  COGNITION: Overall cognitive status: Within functional limits for tasks assessed   SENSATION: Pt reports impaired sensation RUE (dominant side) and impaired sensation in her feet occ  COORDINATION: WFL rapid alt LE, WFL heel>shin bilaterally WFL rapid alt UE bilaterally   EDEMA:  Reports she does get some swelling occ around her stomach, reports hx of digestive issues/celiac, current PEG tube, hx gallbladder surgery and other intestinal surgery?     POSTURE: rounded shoulders   LOWER EXTREMITY MMT:   Grossly 4+/5 bilat LE most deficits   RUE MMT:  BUE grossly 4 to 4+/5 - comparabale each side & no pain with testing  Grip strength 4+/5 bilat UE   TRANSFERS: Assistive device utilized: Environmental consultant - 4 wheeled  Sit to stand: SBA Stand to sit: SBA  GAIT: Gait pattern:  impaired gait speed, CGA provided or use of 4WW, observed decrease in trunk rotation Distance walked: clinic distances, 10 MWT Assistive device utilized: Walker - 4 wheeled and no AD but with close CGA  FUNCTIONAL TESTS:  5 times sit to stand: 13 sec hands-free :  0.96 m/s no AD CGA  decrease in trunk rotation SLB: <5 sec bilat LE  Tandem stance:  within 15-25 sec, BLE extremely tremulous, use of hip strategy   BERG: not assessed  DGI: not assessed     PATIENT SURVEYS:  ABC scale 47.5%                                                                                                                              TREATMENT DATE: 09/02/23  Physical performance Patient demonstrates  increased fall risk as noted by score of  54 /56 on Berg Balance Scale.  (<36= high risk for falls, close to 100%; 37-45 significant >80%; 46-51 moderate >50%; 52-55 lower >25%)  PT instructed pt in DGI. See below for results. Demonstrates increased fall risk with score of 23/24. (<19 indicates increased fall risk)     [facility] PT Assessment - 09/02/23 0001       Berg Balance Test   Sit to Stand Able to stand without using hands and stabilize independently    Standing Unsupported Able to stand safely 2 minutes    Sitting with Back Unsupported but Feet Supported on Floor or Stool Able to sit safely and securely 2 minutes    Stand to Sit Sits safely with minimal use of hands    Transfers Able to transfer safely, minor use of hands    Standing Unsupported with Eyes Closed Able to stand 10 seconds safely    Standing Unsupported with Feet Together Able to place feet together independently and stand 1 minute safely    From Standing, Reach Forward with Outstretched Arm Can reach confidently >25 cm (10")    From Standing Position, Pick up Object from Floor Able to pick up shoe safely and easily    From Standing Position, Turn to Look Behind Over each Shoulder Looks behind one side only/other side shows less weight shift    Turn 360 Degrees Able to turn 360 degrees safely in 4 seconds or less    Standing Unsupported, Alternately Place Feet on Step/Stool Able to stand independently and safely and complete 8 steps in 20 seconds    Standing Unsupported, One Foot in Front Able to place foot tandem independently and hold 30 seconds    Standing on One Leg Able to lift leg independently and hold 5-10 seconds    Total Score 54      Dynamic Gait Index   Level Surface Normal (P)     Change in Gait Speed Normal (P)     Gait with Horizontal Head Turns Normal (P)     Gait with Vertical Head Turns Normal (P)     Gait and Pivot Turn Normal (P)     Step Over Obstacle Normal (P)     Step Around Obstacles Normal  (P)     Steps Mild Impairment (P)     Total Score 23 (P)             10 Meter Walk Test: Patient instructed to walk 10 meters (32.8 ft) as quickly and  as safely as possible at their normal speed x2 and at a fast speed x2. Time measured from 2 meter mark to 8 meter mark to accommodate ramp-up and ramp-down.  Normal speed 1: 1.09 m/s Normal speed 2: 1.25 m/s Average Normal speed: 1.17 m/s Fast speed 1: 1.47 m/s Fast speed 2: 1.61 m/s Average Fast speed: 1.54 m/s Cut off scores: <0.4 m/s = household Ambulator, 0.4-0.8 m/s = limited community Ambulator, >0.8 m/s = community Ambulator, >1.2 m/s = crossing a street, <1.0 = increased fall risk MCID 0.05 m/s (small), 0.13 m/s (moderate) (ANPTA Core Set of Outcome Measures for Adults with Neurologic Conditions, 2018)    PATIENT EDUCATION: Education details: exercise technique Person educated: Patient and   Education method: Explanation Education comprehension: verbalized understanding  HOME EXERCISE PROGRAM: Access Code: [email or web address] URL: [email or web address] Date: 08/06/2023 Prepared by: Marlynn Singer  Exercises - Standing Single Leg Stance with Counter Support  - 1 x daily - 7 x weekly - 2 sets - 30 sec  hold - Sit to Stand with Arms Crossed  - 1 x daily - 7 x weekly - 2 sets - 10 reps - Narrow Stance with Head Nods and Counter Support  - 1 x daily - 7 x weekly - 2 sets - 30 sec  hold    GOALS: Goals reviewed with patient? Yes   SHORT TERM GOALS: Target date: 09/13/2023   Patient will be independent in home exercise program to improve strength/mobility for better functional independence with ADLs. Baseline: 4/24: Doing regularly Goal status: MET   LONG TERM GOALS: Target date: 10/25/2023   Patient will increase ABC scale score >80% to demonstrate better functional mobility and better confidence with ADLs.   Baseline: 47.5% 4/24: 94% Goal status: MET  2.  Patient will complete five times sit to stand  test in < 10 seconds indicating an increased LE strength and improved balance. Baseline: 13 sec hands-free 09/02/23 : 9.27 sec  Goal status: MET  3.  Patient will increase Berg Balance score by > 6 points to demonstrate decreased fall risk during functional activities Baseline: 47 4/24: 54 Goal status: MET  4.  Patient will increase 10 meter walk test to >1.73m/s as to improve gait speed for better community ambulation and to reduce fall risk. Baseline: 0.96 m/s 4/24:1.17 m/s Goal status: MET  5.  Patient will increase dynamic gait index score to >19/24 as to demonstrate reduced fall risk and improved dynamic gait balance for better safety with community/home ambulation.  Baseline: 19 4/24:22/24 Goal status: MET   ASSESSMENT:  CLINICAL IMPRESSION:  Pt presents for progress note and goal assessment. Pt meets all PT goals showing improved community mobility and decreased risk for falls. Pt happy with progress and care she received and has no deficits she is aware of physically at this time. Pt to be discharged with HEP. Updated HEP to add tandem balance.    OBJECTIVE IMPAIRMENTS: Abnormal gait, decreased balance, decreased mobility, difficulty walking, decreased strength, dizziness, increased edema, impaired sensation, impaired UE functional use, improper body mechanics, postural dysfunction, and pain.   ACTIVITY LIMITATIONS: carrying, lifting, bending, standing, squatting, stairs, transfers, dressing, and locomotion level  PARTICIPATION LIMITATIONS: meal prep, cleaning, laundry, driving, shopping, community activity, and yard work  PERSONAL FACTORS: Age, Sex, and 3+ comorbidities:  HTN, DVT, esophageal stricture, OSA, depression, please see above for extensive PMH  are also affecting patient's functional outcome.   REHAB POTENTIAL: Good  CLINICAL DECISION MAKING: Evolving/moderate complexity  EVALUATION COMPLEXITY: Moderate  PLAN:  PT FREQUENCY: 1-2x/week  PT DURATION: 12  weeks  PLANNED INTERVENTIONS: 97164- PT Re-evaluation, 97110-Therapeutic exercises, 97530- Therapeutic activity, 97112- Neuromuscular re-education, 97535- Self Care, 96295- Manual therapy, [phone]- Gait training, [phone]- Orthotic Fit/training, [phone]- Canalith repositioning, Patient/Family education, Balance training, Stair training, Taping, Joint mobilization, Spinal mobilization, Vestibular training, DME instructions, Wheelchair mobility training, Cryotherapy, and Moist heat  PLAN FOR NEXT SESSION:   D/C today   Physical Therapist - [place]  [facility]  4:29 PM 09/02/23

## 2023-09-03 ENCOUNTER — Encounter: Payer: Self-pay | Admitting: Neurosurgery

## 2023-09-06 ENCOUNTER — Encounter: Admitting: Occupational Therapy

## 2023-09-06 ENCOUNTER — Ambulatory Visit

## 2023-09-06 ENCOUNTER — Encounter: Admitting: Speech Pathology

## 2023-09-09 ENCOUNTER — Ambulatory Visit: Admitting: Physical Therapy

## 2023-09-09 ENCOUNTER — Encounter: Admitting: Occupational Therapy

## 2023-09-09 ENCOUNTER — Encounter: Admitting: Speech Pathology

## 2023-09-13 ENCOUNTER — Ambulatory Visit: Admitting: Physical Therapy

## 2023-09-13 ENCOUNTER — Encounter: Admitting: Occupational Therapy

## 2023-09-14 ENCOUNTER — Ambulatory Visit (INDEPENDENT_AMBULATORY_CARE_PROVIDER_SITE_OTHER): Payer: PPO | Admitting: Neurosurgery

## 2023-09-14 ENCOUNTER — Encounter: Payer: Self-pay | Admitting: Neurosurgery

## 2023-09-14 VITALS — BP 136/68 | Ht 61.0 in | Wt 133.0 lb

## 2023-09-14 DIAGNOSIS — S065XAD Traumatic subdural hemorrhage with loss of consciousness status unknown, subsequent encounter: Secondary | ICD-10-CM

## 2023-09-14 DIAGNOSIS — M5412 Radiculopathy, cervical region: Secondary | ICD-10-CM

## 2023-09-14 DIAGNOSIS — Z09 Encounter for follow-up examination after completed treatment for conditions other than malignant neoplasm: Secondary | ICD-10-CM

## 2023-09-14 DIAGNOSIS — R200 Anesthesia of skin: Secondary | ICD-10-CM

## 2023-09-14 DIAGNOSIS — S065XAA Traumatic subdural hemorrhage with loss of consciousness status unknown, initial encounter: Secondary | ICD-10-CM

## 2023-09-14 NOTE — Progress Notes (Signed)
   DOS: 06/25/2023 (L craniotomy for SDH evacuation)  HISTORY OF PRESENT ILLNESS: 09/14/2023 [name] presents today for 3 month follow up after left crani for SDH.  She is doing well and has been discharged from speech and physical therapy however she continues to have right hand numbness feels like she has weakness and is dropping things. She reports this has been going on since her hospitalization but has worsened some. She admitted to some neck pain and intermittent pain down the length of her arm as well. She has painful numbness in her wrist 1st, 3rd and 4th digits on her right hand. She denies any left sided symptoms.   07/29/23 [name] is status post left-sided craniotomy for subdural hematoma evacuation.  She was admitted to the rehab unit at [facility], and has had made dramatic improvements.  Her speech is much improved.  She is having some numbness in her thumb and third and fourth finger as well as upper arm.   PHYSICAL EXAMINATION:   Vitals:   09/14/23 1131  BP: 136/68   General: Patient is well developed, well nourished, calm, collected, and in no apparent distress.  NEUROLOGICAL:  General: In no acute distress.  Awake, alert, oriented to person, place, and time. Pupils equal round and reactive to light.   Speech shows evidence of paraphasic errors but she is conversant.  She is able to name 3 out of 3 and repeat.  Strength: Side Biceps Triceps Deltoid Interossei Grip Wrist Ext. Wrist Flex.  R 5 5 5 5 4 4 4   L 5 5 5 5 5 5 5    Incision c/d/i   ROS (Neurologic): Negative except as noted above  IMAGING: CT Head 07/22/2023 IMPRESSION: Expected evolution with regression of subdural hematoma on the left. No new or progressive finding.     Electronically Signed   By: Ronnette Coke M.D.   On: 07/22/2023 08:30  ASSESSMENT/PLAN:  [name] is doing well after craniotomy for subdural hematoma. She has had some persistent neck pain and RUE radicular symptoms  since her hospitalization. We discussed a possible differential diagnoses of cervical radiculopathy versus a peripheral neuropathy. She has known cervical disease. Given this and her neck pain I would like to start evaluating her with an updated MRI of her cervical spine to evaluate for any underlying cause.  If this is largely negative we discussed potentially an EMG to rule out carpal tunnel.  I will see her back via telephone visit to discuss results of her MRI and further plan of care.  She was encouraged to call the office in the interim with any questions or concerns.  She expressed understanding and was in agreement with this plan.  I spent a total of 10 minutes in this patient's care today. This time was spent reviewing pertinent records including imaging studies, obtaining and confirming history, performing a directed evaluation, formulating and discussing my recommendations, and documenting the visit within the medical record.   Anastacio Karvonen PA-C Department of Neurosurgery

## 2023-09-15 ENCOUNTER — Encounter: Admitting: Speech Pathology

## 2023-09-15 ENCOUNTER — Ambulatory Visit: Admitting: Physical Therapy

## 2023-09-15 ENCOUNTER — Encounter

## 2023-09-20 ENCOUNTER — Encounter: Admitting: Occupational Therapy

## 2023-09-20 ENCOUNTER — Ambulatory Visit: Admitting: Physical Therapy

## 2023-09-20 ENCOUNTER — Encounter: Admitting: Speech Pathology

## 2023-09-20 ENCOUNTER — Ambulatory Visit
Admission: RE | Admit: 2023-09-20 | Discharge: 2023-09-20 | Disposition: A | Discharge status: Home / Self Care | Source: Ambulatory Visit | Attending: Neurosurgery | Admitting: Neurosurgery

## 2023-09-20 DIAGNOSIS — M5412 Radiculopathy, cervical region: Secondary | ICD-10-CM | POA: Insufficient documentation

## 2023-09-20 DIAGNOSIS — R200 Anesthesia of skin: Secondary | ICD-10-CM | POA: Diagnosis present

## 2023-09-22 ENCOUNTER — Encounter: Admitting: Speech Pathology

## 2023-09-22 ENCOUNTER — Ambulatory Visit: Admitting: Physical Therapy

## 2023-09-22 ENCOUNTER — Encounter

## 2023-09-27 ENCOUNTER — Encounter

## 2023-09-27 ENCOUNTER — Ambulatory Visit: Admitting: Physical Therapy

## 2023-09-27 ENCOUNTER — Encounter: Admitting: Speech Pathology

## 2023-09-29 ENCOUNTER — Encounter: Admitting: Occupational Therapy

## 2023-09-29 ENCOUNTER — Ambulatory Visit: Admitting: Physical Therapy

## 2023-09-30 ENCOUNTER — Ambulatory Visit

## 2023-09-30 ENCOUNTER — Encounter: Admitting: Speech Pathology

## 2023-09-30 ENCOUNTER — Encounter: Admitting: Occupational Therapy

## 2023-10-05 ENCOUNTER — Ambulatory Visit: Admitting: Physical Therapy

## 2023-10-05 ENCOUNTER — Encounter: Admitting: Occupational Therapy

## 2023-10-05 ENCOUNTER — Encounter: Admitting: Speech Pathology

## 2023-10-07 ENCOUNTER — Encounter: Admitting: Occupational Therapy

## 2023-10-07 ENCOUNTER — Ambulatory Visit

## 2023-10-07 ENCOUNTER — Encounter: Admitting: Speech Pathology

## 2023-10-11 ENCOUNTER — Encounter: Admitting: Occupational Therapy

## 2023-10-11 ENCOUNTER — Encounter: Admitting: Speech Pathology

## 2023-10-11 ENCOUNTER — Ambulatory Visit: Admitting: Physical Therapy

## 2023-10-13 ENCOUNTER — Encounter: Admitting: Speech Pathology

## 2023-10-13 ENCOUNTER — Ambulatory Visit: Admitting: Physical Therapy

## 2023-10-13 ENCOUNTER — Encounter

## 2023-10-18 ENCOUNTER — Ambulatory Visit: Admitting: Physical Therapy

## 2023-10-18 ENCOUNTER — Encounter: Admitting: Speech Pathology

## 2023-10-18 ENCOUNTER — Encounter: Admitting: Occupational Therapy

## 2023-10-19 ENCOUNTER — Ambulatory Visit (INDEPENDENT_AMBULATORY_CARE_PROVIDER_SITE_OTHER): Admitting: Neurosurgery

## 2023-10-19 VITALS — BP 134/64 | Ht 61.0 in | Wt 133.0 lb

## 2023-10-19 DIAGNOSIS — S065XAD Traumatic subdural hemorrhage with loss of consciousness status unknown, subsequent encounter: Secondary | ICD-10-CM

## 2023-10-19 DIAGNOSIS — R200 Anesthesia of skin: Secondary | ICD-10-CM

## 2023-10-19 DIAGNOSIS — M4802 Spinal stenosis, cervical region: Secondary | ICD-10-CM

## 2023-10-19 DIAGNOSIS — M5412 Radiculopathy, cervical region: Secondary | ICD-10-CM

## 2023-10-19 NOTE — Progress Notes (Signed)
 Progress Note: Referring Physician:  No referring provider defined for this encounter.  Primary Physician:  Sari Cunning, MD  Chief Complaint:  ongoing neck and right arm pain  History of Present Illness: [name] is a 73 y.o. female who presents with the chief complaint of neck and right arm pain with associated numbness. She is well known to our clinic for a history of left sided craniotomy for SDH.  Today she reports overall increased right neck and arm pain with into all of her fingers sparing her pinky on the right side.  She presents today to review her MRI results.  Exam: Today's Vitals   10/19/23 1421  BP: 134/64  Weight: 60.3 kg  Height: 5\' 1"  (1.549 m)  PainSc: 6   PainLoc: Arm   Body mass index is 25.13 kg/m.    NEUROLOGICAL:  General: In no acute distress.   Awake, alert, oriented to person, place, and time.  Pupils equal round and reactive to light.  Facial tone is symmetric.   ROM of spine: full.  Palpation of spine: nontender.    Strength: Side Biceps Triceps Deltoid Interossei Grip Wrist Ext. Wrist Flex.  R 5 5 5 5 5 5 5   L 5 5 5 5 5 5 5      Imaging: 09/20/23 MRI C-spine FINDINGS: Alignment: Straightening of the normal cervical lordosis. Trace stepwise degenerative anterolisthesis of C4 on C5 and C5 on C6, with additional trace anterolisthesis of C7 on T1 through T2 on T3.   Vertebrae: Vertebral body height maintained without acute or chronic fracture. Bone marrow signal intensity within normal limits. No worrisome osseous lesions. No abnormal marrow edema.   Cord: Normal signal and morphology.   Posterior Fossa, vertebral arteries, paraspinal tissues: Patchy signal abnormality within the pons, most characteristic of chronic microvascular ischemic disease. Visualized brain and posterior fossa otherwise unremarkable. Craniocervical junction within normal limits. Paraspinous soft tissues within normal limits. Normal flow voids seen  within the vertebral arteries bilaterally.   Disc levels:   C2-C3: Small left paracentral disc protrusion mildly indents the left ventral thecal sac (series 9, image 8). Moderate left with mild right facet hypertrophy. No significant spinal stenosis. Mild bilateral C3 foraminal narrowing.   C3-C4: Small central disc protrusion mildly indents the ventral thecal sac (series 8, image 13). Bilateral facet degeneration with bony ankylosis. No significant spinal stenosis. Mild bilateral C4 foraminal narrowing.   C4-C5: Mild disc bulge with right greater left uncovertebral spurring. Moderate right with mild left facet hypertrophy. No significant spinal stenosis. Mild right C5 foraminal narrowing. Left neural foramina remains patent.   C5-C6: Degenerative intervertebral disc space narrowing with diffuse disc osteophyte complex. Posterior component flattens and partially faces the ventral thecal sac. Superimposed left-sided facet hypertrophy. Mild spinal stenosis. Moderate bilateral C6 foraminal narrowing.   C6-C7: Degenerative disc space narrowing with circumferential disc osteophyte complex. Posterior component flattens and partially faces the ventral thecal sac. Mild spinal stenosis. Moderate left with mild right C7 foraminal narrowing.   C7-T1: Negative interspace. Mild right-sided facet hypertrophy. No canal or foraminal stenosis.   IMPRESSION: 1. Multilevel cervical spondylosis with resultant mild spinal stenosis at C5-6 and C6-7. 2. Multifactorial degenerative changes with resultant multilevel foraminal narrowing as above. Notable findings include moderate bilateral C6 and left C7 foraminal stenosis, with mild bilateral C3 and C4 foraminal narrowing.     Electronically Signed   By: Virgia Griffins M.D.   On: 10/10/2023 20:13  I have personally reviewed the images  and agree with the above interpretation.  Assessment and Plan: [name] is a pleasant 73 y.o. female  with a history of subdural hematoma with acute on chronic right radiating arm pain concerning for cervical radiculopathy.  Her MRI changes with foraminal stenosis at C5-6 and C6-7.  This could explain her symptoms.  I recommended consideration of ESI's.  If this referral discussed with you for this.  We did discuss that some of her symptoms and weakness are likely related to her subdural hematoma and will likely get better with injections however her neck and radiating arm pain are likely related to her cervical spondylosis.  I will see her back after completion of injections should she have persistent symptoms.  I will otherwise see him going forward on an as-needed basis. She expressed understanding and in agreement with this plan.  I spent a total of 37 minutes in both face-to-face and non-face-to-face activities for this visit on the date of this encounter including review of imaging, discussion of symptoms, physical exam, discussion of differential diagnosis, order placement, and documentation.  Anastacio Karvonen PA-C Neurosurgery

## 2023-10-20 ENCOUNTER — Encounter: Admitting: Speech Pathology

## 2023-10-20 ENCOUNTER — Encounter

## 2023-10-20 ENCOUNTER — Ambulatory Visit: Admitting: Physical Therapy

## 2023-10-25 ENCOUNTER — Encounter: Admitting: Speech Pathology

## 2023-10-25 ENCOUNTER — Ambulatory Visit: Admitting: Physical Therapy

## 2023-10-25 ENCOUNTER — Encounter

## 2023-10-27 ENCOUNTER — Encounter: Admitting: Speech Pathology

## 2023-10-27 ENCOUNTER — Encounter

## 2023-10-27 ENCOUNTER — Ambulatory Visit: Admitting: Physical Therapy

## 2023-11-01 ENCOUNTER — Encounter

## 2023-11-01 ENCOUNTER — Encounter: Admitting: Speech Pathology

## 2023-11-01 ENCOUNTER — Ambulatory Visit: Admitting: Physical Therapy

## 2023-11-03 ENCOUNTER — Ambulatory Visit: Admitting: Physical Therapy

## 2023-11-03 ENCOUNTER — Encounter: Admitting: Occupational Therapy

## 2023-11-03 ENCOUNTER — Encounter: Admitting: Speech Pathology

## 2023-11-08 ENCOUNTER — Encounter: Admitting: Occupational Therapy

## 2023-11-08 ENCOUNTER — Encounter: Admitting: Speech Pathology

## 2023-11-08 ENCOUNTER — Ambulatory Visit: Admitting: Physical Therapy

## 2023-11-10 ENCOUNTER — Ambulatory Visit: Admitting: Physical Therapy

## 2023-11-10 ENCOUNTER — Encounter: Admitting: Speech Pathology

## 2023-11-10 ENCOUNTER — Encounter

## 2023-11-15 ENCOUNTER — Encounter: Admitting: Speech Pathology

## 2023-11-15 ENCOUNTER — Encounter: Admitting: Occupational Therapy

## 2023-11-15 ENCOUNTER — Ambulatory Visit: Admitting: Physical Therapy

## 2023-11-17 ENCOUNTER — Encounter: Admitting: Speech Pathology

## 2023-11-17 ENCOUNTER — Ambulatory Visit: Admitting: Physical Therapy

## 2023-11-17 ENCOUNTER — Encounter

## 2023-11-22 ENCOUNTER — Ambulatory Visit: Admitting: Physical Therapy

## 2023-11-22 ENCOUNTER — Encounter

## 2023-11-22 ENCOUNTER — Encounter: Admitting: Speech Pathology

## 2023-11-24 ENCOUNTER — Encounter

## 2023-11-24 ENCOUNTER — Ambulatory Visit

## 2023-11-24 ENCOUNTER — Encounter: Admitting: Speech Pathology

## 2023-11-25 ENCOUNTER — Ambulatory Visit

## 2023-11-25 ENCOUNTER — Encounter: Admitting: Occupational Therapy

## 2023-11-29 ENCOUNTER — Ambulatory Visit

## 2023-11-29 ENCOUNTER — Encounter: Admitting: Speech Pathology

## 2023-11-29 ENCOUNTER — Encounter

## 2023-12-01 ENCOUNTER — Ambulatory Visit

## 2023-12-01 ENCOUNTER — Encounter: Admitting: Speech Pathology

## 2023-12-01 ENCOUNTER — Encounter

## 2023-12-02 ENCOUNTER — Ambulatory Visit: Admitting: Physical Therapy

## 2023-12-02 ENCOUNTER — Encounter: Admitting: Occupational Therapy

## 2023-12-06 ENCOUNTER — Encounter

## 2023-12-06 ENCOUNTER — Ambulatory Visit

## 2023-12-06 ENCOUNTER — Encounter: Admitting: Occupational Therapy

## 2023-12-08 ENCOUNTER — Ambulatory Visit

## 2023-12-08 ENCOUNTER — Encounter: Admitting: Speech Pathology

## 2023-12-08 ENCOUNTER — Encounter

## 2023-12-13 ENCOUNTER — Ambulatory Visit

## 2023-12-13 ENCOUNTER — Encounter: Admitting: Speech Pathology

## 2023-12-13 ENCOUNTER — Encounter

## 2023-12-15 ENCOUNTER — Encounter

## 2023-12-15 ENCOUNTER — Ambulatory Visit

## 2023-12-15 ENCOUNTER — Encounter: Admitting: Speech Pathology

## 2023-12-20 ENCOUNTER — Encounter: Admitting: Speech Pathology

## 2023-12-20 ENCOUNTER — Ambulatory Visit

## 2023-12-20 ENCOUNTER — Encounter

## 2023-12-22 ENCOUNTER — Encounter

## 2023-12-22 ENCOUNTER — Encounter: Admitting: Speech Pathology

## 2023-12-22 ENCOUNTER — Ambulatory Visit

## 2023-12-27 ENCOUNTER — Encounter

## 2023-12-27 ENCOUNTER — Ambulatory Visit

## 2023-12-27 ENCOUNTER — Encounter: Admitting: Speech Pathology

## 2023-12-29 ENCOUNTER — Ambulatory Visit

## 2023-12-29 ENCOUNTER — Encounter: Admitting: Speech Pathology

## 2023-12-29 ENCOUNTER — Encounter

## 2024-01-03 ENCOUNTER — Encounter: Admitting: Speech Pathology

## 2024-01-03 ENCOUNTER — Encounter

## 2024-01-03 ENCOUNTER — Ambulatory Visit

## 2024-01-05 ENCOUNTER — Encounter

## 2024-01-05 ENCOUNTER — Encounter: Admitting: Speech Pathology

## 2024-01-05 ENCOUNTER — Ambulatory Visit

## 2024-01-12 ENCOUNTER — Encounter

## 2024-01-12 ENCOUNTER — Encounter: Admitting: Speech Pathology

## 2024-01-12 ENCOUNTER — Ambulatory Visit

## 2024-01-17 ENCOUNTER — Encounter

## 2024-01-17 ENCOUNTER — Ambulatory Visit

## 2024-01-17 ENCOUNTER — Encounter: Admitting: Speech Pathology

## 2024-01-19 ENCOUNTER — Encounter

## 2024-01-19 ENCOUNTER — Encounter: Admitting: Speech Pathology

## 2024-01-19 ENCOUNTER — Ambulatory Visit

## 2024-01-24 ENCOUNTER — Encounter

## 2024-01-24 ENCOUNTER — Encounter: Admitting: Speech Pathology

## 2024-01-24 ENCOUNTER — Ambulatory Visit

## 2024-01-26 ENCOUNTER — Encounter: Admitting: Speech Pathology

## 2024-01-26 ENCOUNTER — Encounter

## 2024-01-26 ENCOUNTER — Ambulatory Visit

## 2024-01-31 ENCOUNTER — Encounter

## 2024-01-31 ENCOUNTER — Encounter: Admitting: Speech Pathology

## 2024-01-31 ENCOUNTER — Ambulatory Visit

## 2024-02-02 ENCOUNTER — Ambulatory Visit

## 2024-02-02 ENCOUNTER — Encounter

## 2024-02-02 ENCOUNTER — Encounter: Admitting: Speech Pathology

## 2024-02-06 ENCOUNTER — Observation Stay
Admit: 2024-02-06 | Discharge: 2024-02-06 | Disposition: A | Discharge status: Home / Self Care | Attending: Internal Medicine | Admitting: Internal Medicine

## 2024-02-06 ENCOUNTER — Emergency Department

## 2024-02-06 ENCOUNTER — Other Ambulatory Visit: Payer: Self-pay

## 2024-02-06 ENCOUNTER — Inpatient Hospital Stay
Admission: EM | Admit: 2024-02-06 | Discharge: 2024-02-07 | DRG: 309 | Disposition: A | Discharge status: Home / Self Care | Attending: Internal Medicine | Admitting: Internal Medicine

## 2024-02-06 DIAGNOSIS — I10 Essential (primary) hypertension: Secondary | ICD-10-CM | POA: Insufficient documentation

## 2024-02-06 DIAGNOSIS — K900 Celiac disease: Secondary | ICD-10-CM | POA: Diagnosis not present

## 2024-02-06 DIAGNOSIS — K449 Diaphragmatic hernia without obstruction or gangrene: Secondary | ICD-10-CM | POA: Insufficient documentation

## 2024-02-06 DIAGNOSIS — D649 Anemia, unspecified: Secondary | ICD-10-CM | POA: Insufficient documentation

## 2024-02-06 DIAGNOSIS — Z880 Allergy status to penicillin: Secondary | ICD-10-CM

## 2024-02-06 DIAGNOSIS — K529 Noninfective gastroenteritis and colitis, unspecified: Secondary | ICD-10-CM | POA: Insufficient documentation

## 2024-02-06 DIAGNOSIS — Z888 Allergy status to other drugs, medicaments and biological substances status: Secondary | ICD-10-CM

## 2024-02-06 DIAGNOSIS — Z8249 Family history of ischemic heart disease and other diseases of the circulatory system: Secondary | ICD-10-CM

## 2024-02-06 DIAGNOSIS — I700 Atherosclerosis of aorta: Secondary | ICD-10-CM | POA: Diagnosis not present

## 2024-02-06 DIAGNOSIS — G4733 Obstructive sleep apnea (adult) (pediatric): Secondary | ICD-10-CM | POA: Diagnosis not present

## 2024-02-06 DIAGNOSIS — Z803 Family history of malignant neoplasm of breast: Secondary | ICD-10-CM

## 2024-02-06 DIAGNOSIS — I5A Non-ischemic myocardial injury (non-traumatic): Secondary | ICD-10-CM | POA: Diagnosis not present

## 2024-02-06 DIAGNOSIS — J4489 Other specified chronic obstructive pulmonary disease: Secondary | ICD-10-CM | POA: Diagnosis not present

## 2024-02-06 DIAGNOSIS — I480 Paroxysmal atrial fibrillation: Principal | ICD-10-CM | POA: Diagnosis present

## 2024-02-06 DIAGNOSIS — N200 Calculus of kidney: Secondary | ICD-10-CM | POA: Diagnosis not present

## 2024-02-06 DIAGNOSIS — Z86711 Personal history of pulmonary embolism: Secondary | ICD-10-CM | POA: Insufficient documentation

## 2024-02-06 DIAGNOSIS — J4540 Moderate persistent asthma, uncomplicated: Secondary | ICD-10-CM | POA: Diagnosis not present

## 2024-02-06 DIAGNOSIS — Z79899 Other long term (current) drug therapy: Secondary | ICD-10-CM | POA: Diagnosis not present

## 2024-02-06 DIAGNOSIS — K219 Gastro-esophageal reflux disease without esophagitis: Secondary | ICD-10-CM | POA: Diagnosis not present

## 2024-02-06 DIAGNOSIS — Z9049 Acquired absence of other specified parts of digestive tract: Secondary | ICD-10-CM

## 2024-02-06 DIAGNOSIS — Z881 Allergy status to other antibiotic agents status: Secondary | ICD-10-CM

## 2024-02-06 DIAGNOSIS — F419 Anxiety disorder, unspecified: Secondary | ICD-10-CM | POA: Insufficient documentation

## 2024-02-06 DIAGNOSIS — Z808 Family history of malignant neoplasm of other organs or systems: Secondary | ICD-10-CM

## 2024-02-06 DIAGNOSIS — I4891 Unspecified atrial fibrillation: Secondary | ICD-10-CM | POA: Diagnosis not present

## 2024-02-06 DIAGNOSIS — E785 Hyperlipidemia, unspecified: Secondary | ICD-10-CM | POA: Diagnosis not present

## 2024-02-06 DIAGNOSIS — Z833 Family history of diabetes mellitus: Secondary | ICD-10-CM

## 2024-02-06 DIAGNOSIS — R7401 Elevation of levels of liver transaminase levels: Secondary | ICD-10-CM | POA: Diagnosis present

## 2024-02-06 DIAGNOSIS — E860 Dehydration: Secondary | ICD-10-CM | POA: Diagnosis present

## 2024-02-06 DIAGNOSIS — Z86718 Personal history of other venous thrombosis and embolism: Secondary | ICD-10-CM

## 2024-02-06 DIAGNOSIS — R55 Syncope and collapse: Secondary | ICD-10-CM | POA: Insufficient documentation

## 2024-02-06 DIAGNOSIS — Z7901 Long term (current) use of anticoagulants: Secondary | ICD-10-CM

## 2024-02-06 DIAGNOSIS — F32A Depression, unspecified: Secondary | ICD-10-CM | POA: Insufficient documentation

## 2024-02-06 DIAGNOSIS — J449 Chronic obstructive pulmonary disease, unspecified: Secondary | ICD-10-CM | POA: Diagnosis present

## 2024-02-06 DIAGNOSIS — T466X5A Adverse effect of antihyperlipidemic and antiarteriosclerotic drugs, initial encounter: Secondary | ICD-10-CM | POA: Diagnosis present

## 2024-02-06 DIAGNOSIS — Z7951 Long term (current) use of inhaled steroids: Secondary | ICD-10-CM

## 2024-02-06 DIAGNOSIS — I959 Hypotension, unspecified: Secondary | ICD-10-CM | POA: Diagnosis present

## 2024-02-06 DIAGNOSIS — K589 Irritable bowel syndrome without diarrhea: Secondary | ICD-10-CM | POA: Insufficient documentation

## 2024-02-06 DIAGNOSIS — Z83719 Family history of colon polyps, unspecified: Secondary | ICD-10-CM

## 2024-02-06 DIAGNOSIS — Z887 Allergy status to serum and vaccine status: Secondary | ICD-10-CM

## 2024-02-06 DIAGNOSIS — Z90710 Acquired absence of both cervix and uterus: Secondary | ICD-10-CM

## 2024-02-06 DIAGNOSIS — R079 Chest pain, unspecified: Secondary | ICD-10-CM | POA: Diagnosis present

## 2024-02-06 HISTORY — DX: Acute gastroenteropathy due to Norwalk agent: A08.11

## 2024-02-06 LAB — LACTIC ACID, PLASMA
Lactic Acid, Venous: 2 mmol/L (ref 0.5–1.9)
Lactic Acid, Venous: 1.3 mmol/L (ref 0.5–1.9)

## 2024-02-06 LAB — URINALYSIS, W/ REFLEX TO CULTURE (INFECTION SUSPECTED)
Bacteria, UA: NONE SEEN
Bilirubin Urine: NEGATIVE
Glucose, UA: NEGATIVE mg/dL
Hgb urine dipstick: NEGATIVE
Ketones, ur: NEGATIVE mg/dL
Leukocytes,Ua: NEGATIVE
Nitrite: NEGATIVE
Protein, ur: NEGATIVE mg/dL
Specific Gravity, Urine: >1.046 — ABNORMAL HIGH (ref 1.005–1.030)
pH: 5 (ref 5.0–8.0)

## 2024-02-06 LAB — CBC
HCT: 36.3 % (ref 36.0–46.0)
Hemoglobin: 12.3 g/dL (ref 12.0–15.0)
MCH: 30.8 pg (ref 26.0–34.0)
MCHC: 33.9 g/dL (ref 30.0–36.0)
MCV: 91 fL (ref 80.0–100.0)
Platelets: 222 K/uL (ref 150–400)
RBC: 3.99 MIL/uL (ref 3.87–5.11)
RDW: 13.5 % (ref 11.5–15.5)
WBC: 11 K/uL — ABNORMAL HIGH (ref 4.0–10.5)
nRBC: 0 % (ref 0.0–0.2)

## 2024-02-06 LAB — RESP PANEL BY RT-PCR (RSV, FLU A&B, COVID)  RVPGX2
Influenza A by PCR: NEGATIVE
Influenza B by PCR: NEGATIVE
Resp Syncytial Virus by PCR: NEGATIVE
SARS Coronavirus 2 by RT PCR: NEGATIVE

## 2024-02-06 LAB — COMPREHENSIVE METABOLIC PANEL WITH GFR
ALT: 41 U/L (ref 0–44)
AST: 48 U/L — ABNORMAL HIGH (ref 15–41)
Albumin: 3.6 g/dL (ref 3.5–5.0)
Alkaline Phosphatase: 86 U/L (ref 38–126)
Anion gap: 11 (ref 5–15)
BUN: 16 mg/dL (ref 8–23)
CO2: 23 mmol/L (ref 22–32)
Calcium: 8.4 mg/dL — ABNORMAL LOW (ref 8.9–10.3)
Chloride: 105 mmol/L (ref 98–111)
Creatinine, Ser: 0.73 mg/dL (ref 0.44–1.00)
GFR, Estimated: >60 mL/min (ref >60)
Glucose, Bld: 120 mg/dL — ABNORMAL HIGH (ref 70–99)
Potassium: 3.6 mmol/L (ref 3.5–5.1)
Sodium: 139 mmol/L (ref 135–145)
Total Bilirubin: 1 mg/dL (ref 0.0–1.2)
Total Protein: 6.5 g/dL (ref 6.5–8.1)

## 2024-02-06 LAB — MAGNESIUM: Magnesium: 1.4 mg/dL — ABNORMAL LOW (ref 1.7–2.4)

## 2024-02-06 LAB — LIPASE, BLOOD: Lipase: 24 U/L (ref 11–51)

## 2024-02-06 LAB — TROPONIN I (HIGH SENSITIVITY)
Troponin I (High Sensitivity): 34 ng/L — ABNORMAL HIGH (ref <18)
Troponin I (High Sensitivity): 82 ng/L — ABNORMAL HIGH (ref <18)
Troponin I (High Sensitivity): 198 ng/L (ref <18)

## 2024-02-06 LAB — PROTIME-INR
INR: 1.3 — ABNORMAL HIGH (ref 0.8–1.2)
Prothrombin Time: 17 s — ABNORMAL HIGH (ref 11.4–15.2)

## 2024-02-06 MED ORDER — METOPROLOL SUCCINATE ER 50 MG PO TB24
25.0000 mg | ORAL_TABLET | Freq: Every day | ORAL | Status: DC
  Administered 2024-02-07: 25 mg via ORAL
  Filled 2024-02-06: qty 1

## 2024-02-06 MED ORDER — AMIODARONE HCL IN DEXTROSE 360-4.14 MG/200ML-% IV SOLN
60.0000 mg/h | INTRAVENOUS | Status: AC
  Administered 2024-02-06 (×2): 60 mg/h via INTRAVENOUS
  Filled 2024-02-06 (×2): qty 200

## 2024-02-06 MED ORDER — CITALOPRAM HYDROBROMIDE 20 MG PO TABS
20.0000 mg | ORAL_TABLET | Freq: Every day | ORAL | Status: DC
  Administered 2024-02-07: 20 mg via ORAL
  Filled 2024-02-06: qty 1

## 2024-02-06 MED ORDER — ONDANSETRON HCL 4 MG/2ML IJ SOLN
4.0000 mg | Freq: Once | INTRAMUSCULAR | Status: AC
  Administered 2024-02-06: 4 mg via INTRAVENOUS

## 2024-02-06 MED ORDER — METOPROLOL TARTRATE 5 MG/5ML IV SOLN
5.0000 mg | Freq: Once | INTRAVENOUS | Status: DC
  Filled 2024-02-06: qty 5

## 2024-02-06 MED ORDER — METOPROLOL TARTRATE 25 MG PO TABS: 25.0000 mg | ORAL_TABLET | Freq: Once | ORAL | Status: DC

## 2024-02-06 MED ORDER — APIXABAN 5 MG PO TABS
5.0000 mg | ORAL_TABLET | Freq: Two times a day (BID) | ORAL | Status: DC
Start: 2024-02-06 — End: 2024-02-07
  Administered 2024-02-06 – 2024-02-07 (×2): 5 mg via ORAL
  Filled 2024-02-06 (×2): qty 1

## 2024-02-06 MED ORDER — SODIUM CHLORIDE 0.9% FLUSH
3.0000 mL | Freq: Two times a day (BID) | INTRAVENOUS | Status: DC
Start: 2024-02-06 — End: 2024-02-07
  Administered 2024-02-06 – 2024-02-07 (×2): 3 mL via INTRAVENOUS

## 2024-02-06 MED ORDER — ONDANSETRON HCL 4 MG/2ML IJ SOLN
4.0000 mg | Freq: Once | INTRAMUSCULAR | Status: DC
Start: 2024-02-06 — End: 2024-02-07

## 2024-02-06 MED ORDER — MAGNESIUM SULFATE 2 GM/50ML IV SOLN
2.0000 g | Freq: Once | INTRAVENOUS | Status: AC
  Administered 2024-02-06: 2 g via INTRAVENOUS
  Filled 2024-02-06: qty 50

## 2024-02-06 MED ORDER — ONDANSETRON HCL 4 MG/2ML IJ SOLN
4.0000 mg | Freq: Once | INTRAMUSCULAR | Status: AC
  Administered 2024-02-06: 4 mg via INTRAVENOUS
  Filled 2024-02-06: qty 2

## 2024-02-06 MED ORDER — LOPERAMIDE HCL 2 MG PO CAPS: 2.0000 mg | ORAL_CAPSULE | ORAL | Status: DC | PRN

## 2024-02-06 MED ORDER — LACTATED RINGERS IV SOLN: INTRAVENOUS | Status: AC

## 2024-02-06 MED ORDER — METRONIDAZOLE 500 MG/100ML IV SOLN
500.0000 mg | Freq: Two times a day (BID) | INTRAVENOUS | Status: DC
  Administered 2024-02-06 – 2024-02-07 (×2): 500 mg via INTRAVENOUS
  Filled 2024-02-06 (×2): qty 100

## 2024-02-06 MED ORDER — SODIUM CHLORIDE 0.9 % IV BOLUS
1000.0000 mL | Freq: Once | INTRAVENOUS | Status: AC
  Administered 2024-02-06: 1000 mL via INTRAVENOUS

## 2024-02-06 MED ORDER — ONDANSETRON HCL 4 MG/2ML IJ SOLN
INTRAMUSCULAR | Status: AC
  Filled 2024-02-06: qty 2

## 2024-02-06 MED ORDER — FENTANYL CITRATE PF 50 MCG/ML IJ SOSY
50.0000 ug | PREFILLED_SYRINGE | Freq: Once | INTRAMUSCULAR | Status: AC
  Administered 2024-02-06: 50 ug via INTRAVENOUS
  Filled 2024-02-06: qty 1

## 2024-02-06 MED ORDER — ATORVASTATIN CALCIUM 20 MG PO TABS
20.0000 mg | ORAL_TABLET | Freq: Every day | ORAL | Status: DC
  Administered 2024-02-06: 20 mg via ORAL
  Filled 2024-02-06: qty 1

## 2024-02-06 MED ORDER — PANTOPRAZOLE SODIUM 40 MG PO TBEC
40.0000 mg | DELAYED_RELEASE_TABLET | Freq: Every day | ORAL | Status: DC
  Administered 2024-02-07: 40 mg via ORAL
  Filled 2024-02-06: qty 1

## 2024-02-06 MED ORDER — LACTATED RINGERS IV BOLUS
500.0000 mL | Freq: Once | INTRAVENOUS | Status: AC
  Administered 2024-02-06: 500 mL via INTRAVENOUS

## 2024-02-06 MED ORDER — ACETAMINOPHEN 650 MG RE SUPP: 650.0000 mg | Freq: Four times a day (QID) | RECTAL | Status: DC | PRN

## 2024-02-06 MED ORDER — SODIUM CHLORIDE 0.9 % IV BOLUS
500.0000 mL | Freq: Once | INTRAVENOUS | Status: AC
  Administered 2024-02-06: 500 mL via INTRAVENOUS

## 2024-02-06 MED ORDER — IOHEXOL 300 MG/ML  SOLN: 100.0000 mL | Freq: Once | INTRAMUSCULAR | Status: DC | PRN

## 2024-02-06 MED ORDER — AMIODARONE HCL IN DEXTROSE 360-4.14 MG/200ML-% IV SOLN
30.0000 mg/h | INTRAVENOUS | Status: AC
  Administered 2024-02-06: 30 mg/h via INTRAVENOUS
  Filled 2024-02-06: qty 200

## 2024-02-06 MED ORDER — METRONIDAZOLE 500 MG/100ML IV SOLN
500.0000 mg | Freq: Once | INTRAVENOUS | Status: AC
  Administered 2024-02-06: 500 mg via INTRAVENOUS
  Filled 2024-02-06: qty 100

## 2024-02-06 MED ORDER — ACETAMINOPHEN 325 MG PO TABS: 650.0000 mg | ORAL_TABLET | Freq: Four times a day (QID) | ORAL | Status: DC | PRN

## 2024-02-06 MED ORDER — BUDESONIDE 0.25 MG/2ML IN SUSP: 0.2500 mg | Freq: Two times a day (BID) | RESPIRATORY_TRACT | Status: DC

## 2024-02-06 MED ORDER — IOHEXOL 350 MG/ML SOLN
100.0000 mL | Freq: Once | INTRAVENOUS | Status: AC | PRN
  Administered 2024-02-06: 100 mL via INTRAVENOUS

## 2024-02-06 MED ORDER — PERFLUTREN LIPID MICROSPHERE
1.0000 mL | INTRAVENOUS | Status: AC | PRN
  Administered 2024-02-06: 2.5 mL via INTRAVENOUS

## 2024-02-06 MED ORDER — LORAZEPAM 0.5 MG PO TABS
0.5000 mg | ORAL_TABLET | Freq: Three times a day (TID) | ORAL | Status: DC | PRN
  Administered 2024-02-06: 0.5 mg via ORAL
  Filled 2024-02-06: qty 1

## 2024-02-06 MED ORDER — SODIUM CHLORIDE 0.9 % IV SOLN
2.0000 g | INTRAVENOUS | Status: DC
  Administered 2024-02-07: 2 g via INTRAVENOUS
  Filled 2024-02-06: qty 20

## 2024-02-06 MED ORDER — SODIUM CHLORIDE 0.9 % IV SOLN
2.0000 g | Freq: Once | INTRAVENOUS | Status: AC
  Administered 2024-02-06: 2 g via INTRAVENOUS
  Filled 2024-02-06: qty 20

## 2024-02-06 MED ORDER — AMIODARONE LOAD VIA INFUSION
150.0000 mg | Freq: Once | INTRAVENOUS | Status: AC
  Administered 2024-02-06: 150 mg via INTRAVENOUS
  Filled 2024-02-06: qty 83.34

## 2024-02-06 MED ORDER — ALBUTEROL SULFATE (2.5 MG/3ML) 0.083% IN NEBU: 3.0000 mL | INHALATION_SOLUTION | RESPIRATORY_TRACT | Status: DC | PRN

## 2024-02-06 NOTE — ED Notes (Signed)
 Patient transported to CT

## 2024-02-06 NOTE — ED Notes (Signed)
 This tech assisted pt off of bed pan. Pt was unable to void at this time. Peri care was provided and pt was placed in a clean brief. No further needs expressed at this time.

## 2024-02-06 NOTE — H&P (Addendum)
 History and Physical   [name] FMW:[id number] DOB: [birth year] DOA: 02/06/2024  PCP: Cleotilde Oneil FALCON, MD   Patient coming from: Home  Chief Complaint: Nausea vomiting diarrhea, chest pain, recent syncope  HPI: [name] is a 73 y.o. female with medical history significant of hypertension, hyperlipidemia, GERD, atrial fibrillation, SVT, anemia, OSA, seizures, subdural hematoma status post craniotomy, anxiety, depression, asthma, esophageal stricture, IBS, celiac disease, DVT presenting with multiple complaints including nausea, vomiting, diarrhea, abdominal pain, chest pain, recent syncope.  Patient ports that since Friday she has had significant nausea vomiting with abdominal pain.  No recent antibiotics.  Diarrhea is nonbloody.  Reports a headache and some neck pain yesterday but that is resolved today.  She started having chest pain yesterday as well which persisted into today.  Patient had syncopal episode today.  She had and was walking into the kitchen and she woke up on the floor.  No reported prodrome.  Did not take her medications this morning.  Called EMS and was found to be tachycardic with low normal blood pressure and received 400 cc IV fluids.  Denies fevers, chills, shortness of breath, constipation  ED Course: Vital signs in the ED notable for blood pressure in the 90s-120s systolic, heart rate in the 130s-150s.  Lab workup included CMP with calcium  8.4, AST stable at 48, glucose 120.  CBC with mild leukocytosis to 11.  PT and INR mildly elevated at 17 and 1.3 respectively.  Troponin trend 34, 82.  Lactic acid 2, normal on repeat.  Lipase pending.  Respiratory panel for flu COVID and RSV negative.  Magnesium  1.4.  Urinalysis and blood cultures pending.  C. difficile panel and GI pathogen panel pending.  Chest x-ray showed no acute abnormality.  CTA PE showed no PE, scattered pulmonary nodules, questionable pulmonary edema.  CT abdomen pelvis showed horseshoe  kidney, left renal stones, no acute abnormality.  CT head showed no acute abnormality, status post craniotomy.  CT C-spine showed no acute abnormality.  Patient received fentanyl , ceftriaxone , Flagyl , Zofran , 1 L IV fluids, 1 g magnesium  in the ED.  Also started on 150 cc an hour of IV fluids.  Cardiology consulted for A-fib with RVR and recommended amiodarone bolus and infusion which has been ordered.  Review of Systems: As per HPI otherwise all other systems reviewed and are negative.  Past Medical History:  Diagnosis Date   Abdominal pain, left lower quadrant 06/23/2010   Qualifier: Diagnosis of   By: Debrah MD, Robert D        Acute cystitis without hematuria 07/03/2023   Acute metabolic encephalopathy 03/01/2022   Acute respiratory distress 11/16/2017   Acute respiratory failure (HCC) 11/16/2017   Atypical chest pain 03/08/2015   Bradycardia    Chest pain    Constipation    Depression    Diverticulitis    DIVERTICULITIS, ACUTE 02/25/2010   Qualifier: Diagnosis of   By: Debrah MD, Lamar BIRCH        Diverticulosis of colon    DOE (dyspnea on exertion)    DVT (deep venous thrombosis) (HCC)    Esophageal stricture    Gastroenteritis due to norovirus    Hyperlipemia    Hypertension    IBS (irritable bowel syndrome)    SIRS (systemic inflammatory response syndrome) (HCC) 02/28/2022   Sleep apnea     Past Surgical History:  Procedure Laterality Date   APPENDECTOMY     BLOOD CLOT  2004   RIGHT LEG  CHOLECYSTECTOMY     COLON SURGERY     CRANIOTOMY Left 06/25/2023   Procedure: CRANIOTOMY HEMATOMA EVACUATION SUBDURAL;  Surgeon: Clois Fret, MD;  Location: [facility] ORS;  Service: Neurosurgery;  Laterality: Left;   GALLBLADDER SURGERY  2003   IR GASTROSTOMY TUBE MOD SED  07/05/2023   IR GASTROSTOMY TUBE REMOVAL  08/27/2023   NISSEN FUNDOPLICATION     PLANTAR FASCIA SURGERY  2007   sigmoid colectomy  10/21/2010   pelvic anastomosis   VAGINAL HYSTERECTOMY      Social  History  reports that she has never smoked. She has never used smokeless tobacco. She reports that she does not drink alcohol and does not use drugs.  Allergies  Allergen Reactions   Dicyclomine Other (See Comments)    REACTION: Choking  Other Reaction(s): choking   Influenza Vaccines Anaphylaxis   Lisinopril Other (See Comments)    REACTION: Choking  Other Reaction(s): choking   Losartan  Shortness Of Breath   Amitriptyline Other (See Comments)   Amitriptyline Hcl Other (See Comments)   Ciprofloxacin  Diarrhea and Other (See Comments)   Doxycycline Nausea And Vomiting   Escitalopram Other (See Comments)    Other Reaction(s): too strong   Ace Inhibitors Other (See Comments)    Unknown to patient   Penicillin G Rash   Penicillins Itching and Rash    Family History  Problem Relation Age of Onset   Heart disease Mother    Crohn's disease Sister    Colon polyps Sister    Irritable bowel syndrome Brother    Brain cancer Father    Colitis Sister    Diabetes Sister    Breast cancer Cousin    Colon cancer Neg Hx   Reviewed on admission  Prior to Admission medications   Medication Sig Start Date End Date Taking? Authorizing Provider  albuterol  (VENTOLIN  HFA) 108 (90 Base) MCG/ACT inhaler Inhale 2 puffs into the lungs. 08/13/23   [provider]  apixaban  (ELIQUIS ) 5 MG TABS tablet Take 1 tablet (5 mg total) by mouth 2 (two) times daily. 07/22/23 10/19/23  Love, Sharlet RAMAN, PA-C  atorvastatin  (LIPITOR) 20 MG tablet Take 1 tablet (20 mg total) by mouth daily with supper. 07/22/23   Love, Sharlet RAMAN, PA-C  citalopram  (CELEXA ) 20 MG tablet Take 1 tablet (20 mg total) by mouth daily. 07/22/23   Love, Sharlet RAMAN, PA-C  Ensure Max Protein (ENSURE MAX PROTEIN) LIQD Take 330 mLs (11 oz total) by mouth 2 (two) times daily. 07/22/23   Love, Sharlet RAMAN, PA-C  fluticasone  (FLOVENT  HFA) 110 MCG/ACT inhaler Inhale 2 puffs into the lungs 2 (two) times daily. 08/19/23   [provider]   LORazepam  (ATIVAN ) 0.5 MG tablet Take 0.5 mg by mouth every 8 (eight) hours as needed. For anxiety    [provider]  metoprolol  succinate (TOPROL -XL) 25 MG 24 hr tablet Take 1 tablet (25 mg total) by mouth daily. 07/23/23   Love, Sharlet RAMAN, PA-C  pantoprazole  (PROTONIX ) 40 MG tablet Take 40 mg by mouth.    [provider]  spironolactone  (ALDACTONE ) 25 MG tablet Take 25 mg by mouth daily. 08/01/23   [provider]  vitamin D3 (CHOLECALCIFEROL ) 25 MCG tablet Take 4 tablets (4,000 Units total) by mouth 2 (two) times daily. 07/22/23   Maurice Sharlet RAMAN, PA-C    Physical Exam: Vitals:   02/06/24 1200 02/06/24 1230 02/06/24 1300 02/06/24 1340  BP: 121/70 (!) 83/65 (!) 88/57 (!) 91/52  Pulse: (!) 127 ROLLEN)  147 (!) 135 (!) 106  Resp: (!) 22 19    Temp:      SpO2: 94% 94% 93% 95%  Weight:        Physical Exam Constitutional:      General: She is not in acute distress.    Appearance: Normal appearance.  HENT:     Head: Normocephalic and atraumatic.     Mouth/Throat:     Mouth: Mucous membranes are moist.     Pharynx: Oropharynx is clear.  Eyes:     Extraocular Movements: Extraocular movements intact.     Pupils: Pupils are equal, round, and reactive to light.  Cardiovascular:     Rate and Rhythm: Tachycardia present. Rhythm irregular.     Pulses: Normal pulses.     Heart sounds: Normal heart sounds.  Pulmonary:     Effort: Pulmonary effort is normal. No respiratory distress.     Breath sounds: Normal breath sounds.  Abdominal:     General: Bowel sounds are normal. There is no distension.     Palpations: Abdomen is soft.     Tenderness: There is abdominal tenderness.  Musculoskeletal:        General: No swelling or deformity.  Skin:    General: Skin is warm and dry.  Neurological:     General: No focal deficit present.     Mental Status: Mental status is at baseline.    Labs on Admission: I have personally reviewed following labs and imaging  studies  CBC: Recent Labs  Lab 02/06/24 0907  WBC 11.0*  HGB 12.3  HCT 36.3  MCV 91.0  PLT 222    Basic Metabolic Panel: Recent Labs  Lab 02/06/24 0907  NA 139  K 3.6  CL 105  CO2 23  GLUCOSE 120*  BUN 16  CREATININE 0.73  CALCIUM  8.4*  MG 1.4*    GFR: Estimated Creatinine Clearance: 55.1 mL/min (by C-G formula based on SCr of 0.73 mg/dL).  Liver Function Tests: Recent Labs  Lab 02/06/24 0907  AST 48*  ALT 41  ALKPHOS 86  BILITOT 1.0  PROT 6.5  ALBUMIN 3.6    Urine analysis:    Component Value Date/Time   COLORURINE YELLOW (A) 07/10/2023 1458   APPEARANCEUR CLOUDY (A) 07/10/2023 1458   APPEARANCEUR Clear 11/22/2013 0741   LABSPEC 1.014 07/10/2023 1458   LABSPEC 1.010 11/22/2013 0741   PHURINE 7.0 07/10/2023 1458   GLUCOSEU NEGATIVE 07/10/2023 1458   GLUCOSEU Negative 11/22/2013 0741   GLUCOSEU NEGATIVE 08/12/2010 1641   HGBUR NEGATIVE 07/10/2023 1458   BILIRUBINUR NEGATIVE 07/10/2023 1458   BILIRUBINUR Negative 11/22/2013 0741   KETONESUR NEGATIVE 07/10/2023 1458   PROTEINUR NEGATIVE 07/10/2023 1458   UROBILINOGEN 1.0 08/13/2010 1052   NITRITE NEGATIVE 07/10/2023 1458   LEUKOCYTESUR LARGE (A) 07/10/2023 1458   LEUKOCYTESUR Trace 11/22/2013 0741    Radiological Exams on Admission: CT ABDOMEN PELVIS W CONTRAST Result Date: 02/06/2024 CLINICAL DATA:  LLQ abdominal pain; Pulmonary embolism (PE) suspected, high prob EXAM: CT ANGIOGRAPHY CHEST CT ABDOMEN AND PELVIS WITH CONTRAST TECHNIQUE: Multidetector CT imaging of the chest was performed using the standard protocol during bolus administration of intravenous contrast. Multiplanar CT image reconstructions and MIPs were obtained to evaluate the vascular anatomy. Multidetector CT imaging of the abdomen and pelvis was performed using the standard protocol during bolus administration of intravenous contrast. RADIATION DOSE REDUCTION: This exam was performed according to the departmental dose-optimization  program which includes automated exposure control, adjustment of the mA and/or  kV according to patient size and/or use of iterative reconstruction technique. CONTRAST:  OMNIPAQUE  IOHEXOL  350 MG/ML SOLN COMPARISON:  December 29, 2022 FINDINGS: CTA CHEST FINDINGS Cardiovascular: Predominately LEFT-sided coronary artery atherosclerotic calcifications. Atherosclerotic calcifications of the nonaneurysmal thoracic aorta. No pericardial effusion. Heart is unchanged in size. Adequate contrast opacification of the pulmonary arteries. No acute pulmonary embolism identified. Mildly limited evaluation of the distal pulmonary arteries secondary to motion. Mediastinum/Nodes: No axillary or mediastinal adenopathy. Lungs/Pleura: No pleural effusion or pneumothorax. Mild interlobular septal thickening. Mild bronchial wall thickening. Scattered tiny pulmonary nodules measuring up to 2-3 mm. Representative RIGHT upper lobe 2 mm pulmonary nodule (series 6, image 47) and representative 2 mm RIGHT lower lobe pulmonary nodule (series 6, image 143). Representative 3 mm LEFT upper lobe pulmonary nodule (series 6, image 122). Musculoskeletal: No chest wall abnormality. No acute or significant osseous findings. Review of the MIP images confirms the above findings. CT ABDOMEN and PELVIS FINDINGS Hepatobiliary: Status post cholecystectomy. Mild central biliary ductal prominence with dilation of the common bile duct to 9 mm, similar compared to prior. Fatty deposition along the falciform ligament. Portal vein is patent. Pancreas: Unremarkable. No pancreatic ductal dilatation or surrounding inflammatory changes. Spleen: Normal in size without focal abnormality. Adrenals/Urinary Tract: Adrenal glands are unremarkable. Horseshoe kidney with renal cortical scarring the LEFT renal moiety. Moieties enhance symmetrically. No hydronephrosis. Nonobstructing LEFT-sided nephrolithiasis. No obstructing nephrolithiasis visualized. Bladder is  unremarkable. Stomach/Bowel: No evidence of bowel obstruction. Status post partial colectomy. Infant application. Small hiatal hernia. Status post appendectomy Vascular/Lymphatic: Atherosclerotic calcifications of the nonaneurysmal abdominal aorta. Dense atherosclerotic calcification at the origin of the celiac no suspicious adenopathy. Reproductive: Status post hysterectomy. No adnexal masses. Other: No free air or free fluid. Musculoskeletal: Degenerative changes of the lumbar spine with asymmetric disc space height loss at L4-5. Degenerative changes of the LEFT greater than RIGHT hip. Review of the MIP images confirms the above findings. IMPRESSION: 1. No acute pulmonary embolism. 2. Mild interlobular septal thickening and bronchial wall thickening may reflect mild pulmonary edema. 3. No acute findings in the abdomen or pelvis. 4. Horseshoe kidney with nonobstructing LEFT-sided nephrolithiasis. 5. Scattered tiny pulmonary nodules measuring up to 2-3 mm. No follow-up needed if patient is low-risk (and has no known or suspected primary neoplasm). Non-contrast chest CT can be considered in 12 months if patient is high-risk. This recommendation follows the consensus statement: Guidelines for Management of Incidental Pulmonary Nodules Detected on CT Images: From the Fleischner Society 2017; Radiology 2017; 284:228-243. Aortic Atherosclerosis (ICD10-I70.0). Electronically Signed   By: Corean Salter M.D.   On: 02/06/2024 12:38   CT Angio Chest Pulmonary Embolism (PE) W or WO Contrast Result Date: 02/06/2024 CLINICAL DATA:  LLQ abdominal pain; Pulmonary embolism (PE) suspected, high prob EXAM: CT ANGIOGRAPHY CHEST CT ABDOMEN AND PELVIS WITH CONTRAST TECHNIQUE: Multidetector CT imaging of the chest was performed using the standard protocol during bolus administration of intravenous contrast. Multiplanar CT image reconstructions and MIPs were obtained to evaluate the vascular anatomy. Multidetector CT imaging of  the abdomen and pelvis was performed using the standard protocol during bolus administration of intravenous contrast. RADIATION DOSE REDUCTION: This exam was performed according to the departmental dose-optimization program which includes automated exposure control, adjustment of the mA and/or kV according to patient size and/or use of iterative reconstruction technique. CONTRAST:  OMNIPAQUE  IOHEXOL  350 MG/ML SOLN COMPARISON:  December 29, 2022 FINDINGS: CTA CHEST FINDINGS Cardiovascular: Predominately LEFT-sided coronary artery atherosclerotic calcifications. Atherosclerotic calcifications of the nonaneurysmal  thoracic aorta. No pericardial effusion. Heart is unchanged in size. Adequate contrast opacification of the pulmonary arteries. No acute pulmonary embolism identified. Mildly limited evaluation of the distal pulmonary arteries secondary to motion. Mediastinum/Nodes: No axillary or mediastinal adenopathy. Lungs/Pleura: No pleural effusion or pneumothorax. Mild interlobular septal thickening. Mild bronchial wall thickening. Scattered tiny pulmonary nodules measuring up to 2-3 mm. Representative RIGHT upper lobe 2 mm pulmonary nodule (series 6, image 47) and representative 2 mm RIGHT lower lobe pulmonary nodule (series 6, image 143). Representative 3 mm LEFT upper lobe pulmonary nodule (series 6, image 122). Musculoskeletal: No chest wall abnormality. No acute or significant osseous findings. Review of the MIP images confirms the above findings. CT ABDOMEN and PELVIS FINDINGS Hepatobiliary: Status post cholecystectomy. Mild central biliary ductal prominence with dilation of the common bile duct to 9 mm, similar compared to prior. Fatty deposition along the falciform ligament. Portal vein is patent. Pancreas: Unremarkable. No pancreatic ductal dilatation or surrounding inflammatory changes. Spleen: Normal in size without focal abnormality. Adrenals/Urinary Tract: Adrenal glands are unremarkable. Horseshoe  kidney with renal cortical scarring the LEFT renal moiety. Moieties enhance symmetrically. No hydronephrosis. Nonobstructing LEFT-sided nephrolithiasis. No obstructing nephrolithiasis visualized. Bladder is unremarkable. Stomach/Bowel: No evidence of bowel obstruction. Status post partial colectomy. Infant application. Small hiatal hernia. Status post appendectomy Vascular/Lymphatic: Atherosclerotic calcifications of the nonaneurysmal abdominal aorta. Dense atherosclerotic calcification at the origin of the celiac no suspicious adenopathy. Reproductive: Status post hysterectomy. No adnexal masses. Other: No free air or free fluid. Musculoskeletal: Degenerative changes of the lumbar spine with asymmetric disc space height loss at L4-5. Degenerative changes of the LEFT greater than RIGHT hip. Review of the MIP images confirms the above findings. IMPRESSION: 1. No acute pulmonary embolism. 2. Mild interlobular septal thickening and bronchial wall thickening may reflect mild pulmonary edema. 3. No acute findings in the abdomen or pelvis. 4. Horseshoe kidney with nonobstructing LEFT-sided nephrolithiasis. 5. Scattered tiny pulmonary nodules measuring up to 2-3 mm. No follow-up needed if patient is low-risk (and has no known or suspected primary neoplasm). Non-contrast chest CT can be considered in 12 months if patient is high-risk. This recommendation follows the consensus statement: Guidelines for Management of Incidental Pulmonary Nodules Detected on CT Images: From the Fleischner Society 2017; Radiology 2017; 284:228-243. Aortic Atherosclerosis (ICD10-I70.0). Electronically Signed   By: Corean Salter M.D.   On: 02/06/2024 12:38   CT Head Wo Contrast Result Date: 02/06/2024 EXAM: CT HEAD WITHOUT CONTRAST 02/06/2024 09:53:19 AM TECHNIQUE: CT of the head was performed without the administration of intravenous contrast. Automated exposure control, iterative reconstruction, and/or weight based adjustment of the  mA/kV was utilized to reduce the radiation dose to as low as reasonably achievable. COMPARISON: CT of the head dated 08/12/2023. CLINICAL HISTORY: Head trauma, minor (Age >= 65y). From home for syncope, hit head, +blood thinners. N/v/d started yesterday. Chest pain radiating to left arm started yesterday. Hypotensive 98/50 with EMS. FINDINGS: BRAIN AND VENTRICLES: No acute hemorrhage. No evidence of acute infarct. No hydrocephalus. No extra-axial collection. No mass effect or midline shift. The patient is status post left frontoparietal craniotomy. A partially calcified soft tissue is again demonstrated deep to the left craniotomy flap, which appears unchanged. There is mild periventricular white matter disease. There are calcifications within the carotid siphons. ORBITS: No acute abnormality. SINUSES: No acute abnormality. SOFT TISSUES AND SKULL: No acute soft tissue abnormality. No skull fracture. IMPRESSION: 1. No acute intracranial abnormality. 2. Status post left frontoparietal craniotomy with unchanged partially calcified soft  tissue deep to the left craniotomy flap. 3. Mild periventricular white matter disease. Electronically signed by: Evalene Coho MD 02/06/2024 10:19 AM EDT RP Workstation: [id number]   CT Cervical Spine Wo Contrast Result Date: 02/06/2024 CLINICAL DATA:  Neck trauma (Age >= 65y) Syncopal episode with fall.  On blood thinners. EXAM: CT CERVICAL SPINE WITHOUT CONTRAST TECHNIQUE: Multidetector CT imaging of the cervical spine was performed without intravenous contrast. Multiplanar CT image reconstructions were also generated. RADIATION DOSE REDUCTION: This exam was performed according to the departmental dose-optimization program which includes automated exposure control, adjustment of the mA and/or kV according to patient size and/or use of iterative reconstruction technique. COMPARISON:  CT cervical spine 08/12/2023. MRI cervical spine 09/20/2023. FINDINGS: Alignment: Reversal of  cervical lordosis, similar to previous CT. No focal angulation or listhesis. Skull base and vertebrae: No evidence of acute cervical spine fracture or traumatic subluxation. Probable chronic interbody and interfacetal ankylosis bilaterally at C3-4. Soft tissues and spinal canal: No prevertebral fluid or swelling. No visible canal hematoma. Disc levels: Multilevel spondylosis again noted with disc space narrowing, uncinate spurring and facet hypertrophy, greatest at C5-6, C6-7 and C7-T1. Scattered mild foraminal narrowing. No large disc herniation or high-grade spinal stenosis demonstrated. Upper chest: Clear lung apices. Other: Mild carotid atherosclerosis. IMPRESSION: 1. No evidence of acute cervical spine fracture, traumatic subluxation or static signs of instability. 2. Multilevel cervical spondylosis as described. Electronically Signed   By: Elsie Perone M.D.   On: 02/06/2024 10:00   DG Chest Portable 1 View Result Date: 02/06/2024 EXAM: 1 VIEW(S) XRAY OF THE CHEST 02/06/2024 09:12:00 AM COMPARISON: 07/07/2023 CLINICAL HISTORY: chest pain. Table formatting from the original note was not included.; Pt to ED [facility] from home for syncope, hit head, +blood thinners. N/v/d started yesterday. Chest pain radiating to left arm started yesterday. Hypotensive 98/50 with ems, 20g L AC with EMS, 20g R wrist with EMS. A fib 130-160. LR PTA. Cbg 136; 4mg  zofran  PTA. FINDINGS: LUNGS AND PLEURA: No focal pulmonary opacity. No pulmonary edema. No pleural effusion. No pneumothorax. HEART AND MEDIASTINUM: Aortic arch calcifications. BONES AND SOFT TISSUES: Surgical clips in upper abdomen. No acute osseous abnormality. IMPRESSION: 1. No acute cardiopulmonary process. Electronically signed by: Waddell Calk MD 02/06/2024 09:19 AM EDT RP Workstation: [id number]   EKG: Independently reviewed.  Atrial fibrillation with RVR at 140 bpm.  Nonspecific T wave changes.  Assessment/Plan Principal Problem:   Atrial  fibrillation with RVR (HCC) Active Problems:   Anxiety and depression   Primary hypertension   Irritable bowel syndrome   Hyperlipidemia   Gastroesophageal reflux disease without esophagitis   Dyslipidemia   GERD without esophagitis   Adult celiac disease   Chronic obstructive pulmonary disease, unspecified (HCC)   History of DVT (deep vein thrombosis)   Obstructive sleep apnea syndrome   Gastroenteritis   Syncope   RAD (reactive airway disease), moderate persistent, uncomplicated   Atrial fibrillation with RVR > May be tachycardic by EMS today with low normal blood pressures.  Brought to the ED and found to be in A-fib with RVR.  Rates in the 130s-150s. > Cardiology consulted and recommended amiodarone bolus and infusion which has been started.  Patient remains on Eliquis . > Chest x-ray showed no acute abnormality.  CTA PE study showed no PE, scattered pulmonary nodules, questionable pulmonary edema.  Troponin trend 34, 82. > Blood pressure has improved with IV fluids and is stable at this time on amiodarone. > Possible trigger includes ongoing nausea vomiting and  diarrhea, as below. -Appreciate cardiology recommendations and assistance - Monitor on progressive unit overnight - Continue with IV amiodarone - Echocardiogram - Continue to trend troponin to peak - Continue home Eliquis , metoprolol   Nausea, vomiting, diarrhea Abdominal pain > Suspected infectious gastroenteritis. > Distant history of IBS and celiac disease in chart.  Mild leukocytosis to 11 unclear there is a reactive component to this in the setting of A-fib with RVR. > CT abdomen pelvis showed no acute abnormality.  Demonstrated horseshoe kidney with some left-sided stones. - Monitoring on progressive as above - Started on ceftriaxone  and Flagyl  in the ED, will continue for now - IV fluids - Supportive care  Syncopal event > Had syncopal event this morning.  Woke on the floor after walking to the kitchen.  No  prodrome. - Monitor in telemetry as above - Echocardiogram as above - Will check orthostatic vital signs  Hypomagnesemia > Magnesium  1.4. - 1 g IV magnesium  in the ED - Trend renal function and electrolytes  History of subdural hematoma > Had midline shift and symptoms required surgical intervention.  Status post craniotomy. - Noted  Hypertension - Holding spironolactone  for normal blood pressure - Will continue with metoprolol  as above  Hyperlipidemia - Continue atorvastatin   GERD - Continue PPI  Anemia > Normal hemoglobin in the ED - Trend CBC  OSA - Does not tolerate CPAP per chart  IBS Celiac disease - Gluten-free diet  Anxiety Depression - Continue Celexa  and as needed Ativan   History of DVT - On Eliquis  as above  Reactive airway disease - Continue as needed albuterol  - Flovent   DVT prophylaxis: Eliquis  Code Status:   Full Family Communication:  Updated at bedside  Disposition Plan:   Patient is from:  Home  Anticipated DC to:  Home  Anticipated DC date:  1 to 4 days  Anticipated DC barriers: None  Consults called:  Cardiology Admission status:  Observation, progressive  Severity of Illness: The appropriate patient status for this patient is OBSERVATION. Observation status is judged to be reasonable and necessary in order to provide the required intensity of service to ensure the patient's safety. The patient's presenting symptoms, physical exam findings, and initial radiographic and laboratory data in the context of their medical condition is felt to place them at decreased risk for further clinical deterioration. Furthermore, it is anticipated that the patient will be medically stable for discharge from the hospital within 2 midnights of admission.    Marsa KATHEE Scurry MD [facility]  How to contact the [facility] Attending or Consulting provider 7A - 7P or covering provider during after hours 7P -7A, for this patient?   Check the care team in  [facility] and look for a) attending/consulting [facility] provider listed and b) the [facility] team listed Log into [email or web address] and use [facility]'s universal password to access. If you do not have the password, please contact the hospital operator. Locate the [facility] provider you are looking for under [facility] and page to a number that you can be directly reached. If you still have difficulty reaching the provider, please page the [facility] (Director on Call) for the Hospitalists listed on amion for assistance.  02/06/2024, 2:01 PM

## 2024-02-06 NOTE — Progress Notes (Signed)
 Echocardiogram 2D Echocardiogram has been performed with definity.  Januel Doolan N Holly Iannaccone,RDCS 02/06/2024, 3:11 PM

## 2024-02-06 NOTE — Progress Notes (Signed)
 CODE SEPSIS - PHARMACY COMMUNICATION  **Broad Spectrum Antibiotics should be administered within 1 hour of Sepsis diagnosis**  Time Code Sepsis Called/Page Received: 1029  Antibiotics Ordered: ceftriaxone , metronidazole   Time of 1st antibiotic administration: 1115  Additional action taken by pharmacy: none required  If necessary, Name of Provider/Nurse Contacted: N/A    Adriana JONETTA Bolster ,PharmD Clinical Pharmacist  02/06/2024  10:34 AM

## 2024-02-06 NOTE — ED Triage Notes (Signed)
 Pt to ED [facility] from home for syncope, hit head, +blood thinners. N/v/d started yesterday. Chest pain radiating to left arm started yesterday. Hypotensive 98/50 with ems, 20g L AC with EMS, 20g R wrist with EMS. A fib 130-160. LR PTA. Cbg 136 4mg  zofran  PTA.

## 2024-02-06 NOTE — Sepsis Progress Note (Signed)
Elink following codes sepsis

## 2024-02-06 NOTE — Consult Note (Signed)
 [facility] Cardiology  CARDIOLOGY CONSULT NOTE  Patient ID: [name] MRN: [id number] DOB/AGE: December 18, 1950 73 y.o.  Admit date: 02/06/2024 Referring Physician Dr. Suzanne Primary Cardiologist Dr. Ammon Reason for Consultation A-fib with RVR  HPI: 73 year old female on whom our service is consulted for atrial fibrillation with RVR.  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  CTA coronaries 2021 with minimal nonobstructive CAD.  Echocardiogram 10/2022 with normal biventricular systolic function and no significant valvular abnormality.  Patient evaluated at bedside.  She is accompanied by her daughter and son-in-law.  2 days ago while sitting in recliner she had mechanical fall where she hit her head and neck.  Since yesterday has been feeling sick with nausea and diarrhea, felt dehydrated.  Having exertional dyspnea with some chest tightness.  This morning while walking to kitchen she passed out.  Came to ED.  Noted to be tachycardic with soft blood pressure.  Received 2 L IV fluids. EKG with A-fib with RVR with rate of 142 bpm.  CT chest negative for PE. CT head/cervical spine with no acute intracranial abnormality.  CT abdomen with no acute findings.  Troponin minimal.  Blood pressure soft with systolic around 90.  Currently resting comfortably in bed.  Review of systems complete and found to be negative unless listed above     Past Medical History:  Diagnosis Date   Bradycardia    Chest pain    Constipation    Depression    Diverticulitis    Diverticulosis of colon    DOE (dyspnea on exertion)    DVT (deep venous thrombosis) (HCC)    Esophageal stricture    Hyperlipemia    Hypertension    IBS (irritable bowel syndrome)    Sleep apnea     Past Surgical History:  Procedure Laterality Date   APPENDECTOMY     BLOOD CLOT  2004   RIGHT LEG   CHOLECYSTECTOMY     COLON  SURGERY     CRANIOTOMY Left 06/25/2023   Procedure: CRANIOTOMY HEMATOMA EVACUATION SUBDURAL;  Surgeon: Clois Fret, MD;  Location: [facility] ORS;  Service: Neurosurgery;  Laterality: Left;   GALLBLADDER SURGERY  2003   IR GASTROSTOMY TUBE MOD SED  07/05/2023   IR GASTROSTOMY TUBE REMOVAL  08/27/2023   NISSEN FUNDOPLICATION     PLANTAR FASCIA SURGERY  2007   sigmoid colectomy  10/21/2010   pelvic anastomosis   VAGINAL HYSTERECTOMY      (Not in a hospital admission)  Social History   Socioeconomic History   Marital status: Divorced    Spouse name: Not on file   Number of children: 1   Years of education: GED   Highest education level: GED or equivalent  Occupational History   Occupation: retired Psychiatric nurse  Tobacco Use   Smoking status: Never   Smokeless tobacco: Never  Vaping Use   Vaping status: Never Used  Substance and Sexual Activity   Alcohol use: No   Drug use: No   Sexual activity: Not on file  Other Topics Concern   Not on file  Social History Narrative   Epworth Sleepiness Score = 9 (03/08/2015)   Lives alone in a one story home.  Has one daughter.  Retired.  Education: GED   Social Drivers of Corporate investment banker Strain: Low Risk  (07/30/2023)   Received from [facility]   Overall Financial Resource Strain (CARDIA)  Difficulty of Paying Living Expenses: Not hard at all  Food Insecurity: No Food Insecurity (07/30/2023)   Received from [facility]   Hunger Vital Sign    Within the past 12 months, you worried that your food would run out before you got the money to buy more.: Never true    Within the past 12 months, the food you bought just didn't last and you didn't have money to get more.: Never true  Transportation Needs: No Transportation Needs (07/30/2023)   Received from [facility] - Transportation    In the past 12 months, has lack of transportation kept you from medical  appointments or from getting medications?: No    Lack of Transportation (Non-Medical): No  Physical Activity: Not on file  Stress: Not on file  Social Connections: Not on file  Intimate Partner Violence: Not At Risk (03/01/2022)   Humiliation, Afraid, Rape, and Kick questionnaire    Fear of Current or Ex-Partner: No    Emotionally Abused: No    Physically Abused: No    Sexually Abused: No    Family History  Problem Relation Age of Onset   Heart disease Mother    Crohn's disease Sister    Colon polyps Sister    Irritable bowel syndrome Brother    Brain cancer Father    Colitis Sister    Diabetes Sister    Breast cancer Cousin    Colon cancer Neg Hx       Review of systems complete and found to be negative unless listed above      PHYSICAL EXAM Irregularly irregular rhythm.  Systolic murmur left lower sternal border. No wheeze or crackles No JVD Alert and oriented x 3 No pedal edema  Labs:   Lab Results  Component Value Date   WBC 11.0 (H) 02/06/2024   HGB 12.3 02/06/2024   HCT 36.3 02/06/2024   MCV 91.0 02/06/2024   PLT 222 02/06/2024    Recent Labs  Lab 02/06/24 0907  NA 139  K 3.6  CL 105  CO2 23  BUN 16  CREATININE 0.73  CALCIUM  8.4*  PROT 6.5  BILITOT 1.0  ALKPHOS 86  ALT 41  AST 48*  GLUCOSE 120*   Lab Results  Component Value Date   CKTOTAL 68 02/28/2022   CKMB < 0.5 (L) 11/22/2013   TROPONINI <0.03 11/15/2017    Lab Results  Component Value Date   CHOL 131 07/09/2023   CHOL 224 (H) 07/28/2016   CHOL 209 (H) 11/22/2013   Lab Results  Component Value Date   HDL 38 (L) 07/09/2023   HDL 57 07/28/2016   HDL 56 11/22/2013   Lab Results  Component Value Date   LDLCALC 68 07/09/2023   LDLCALC 135 (H) 07/28/2016   LDLCALC 133 (H) 11/22/2013   Lab Results  Component Value Date   TRIG 123 07/09/2023   TRIG 162 (H) 07/28/2016   TRIG 99 11/22/2013   Lab Results  Component Value Date   CHOLHDL 3.4 07/09/2023   CHOLHDL 3.9  07/28/2016   No results found for: LDLDIRECT    Radiology: CT ABDOMEN PELVIS W CONTRAST Result Date: 02/06/2024 CLINICAL DATA:  LLQ abdominal pain; Pulmonary embolism (PE) suspected, high prob EXAM: CT ANGIOGRAPHY CHEST CT ABDOMEN AND PELVIS WITH CONTRAST TECHNIQUE: Multidetector CT imaging of the chest was performed using the standard protocol during bolus administration of intravenous contrast. Multiplanar CT image reconstructions and MIPs were obtained to  evaluate the vascular anatomy. Multidetector CT imaging of the abdomen and pelvis was performed using the standard protocol during bolus administration of intravenous contrast. RADIATION DOSE REDUCTION: This exam was performed according to the departmental dose-optimization program which includes automated exposure control, adjustment of the mA and/or kV according to patient size and/or use of iterative reconstruction technique. CONTRAST:  OMNIPAQUE  IOHEXOL  350 MG/ML SOLN COMPARISON:  December 29, 2022 FINDINGS: CTA CHEST FINDINGS Cardiovascular: Predominately LEFT-sided coronary artery atherosclerotic calcifications. Atherosclerotic calcifications of the nonaneurysmal thoracic aorta. No pericardial effusion. Heart is unchanged in size. Adequate contrast opacification of the pulmonary arteries. No acute pulmonary embolism identified. Mildly limited evaluation of the distal pulmonary arteries secondary to motion. Mediastinum/Nodes: No axillary or mediastinal adenopathy. Lungs/Pleura: No pleural effusion or pneumothorax. Mild interlobular septal thickening. Mild bronchial wall thickening. Scattered tiny pulmonary nodules measuring up to 2-3 mm. Representative RIGHT upper lobe 2 mm pulmonary nodule (series 6, image 47) and representative 2 mm RIGHT lower lobe pulmonary nodule (series 6, image 143). Representative 3 mm LEFT upper lobe pulmonary nodule (series 6, image 122). Musculoskeletal: No chest wall abnormality. No acute or significant osseous  findings. Review of the MIP images confirms the above findings. CT ABDOMEN and PELVIS FINDINGS Hepatobiliary: Status post cholecystectomy. Mild central biliary ductal prominence with dilation of the common bile duct to 9 mm, similar compared to prior. Fatty deposition along the falciform ligament. Portal vein is patent. Pancreas: Unremarkable. No pancreatic ductal dilatation or surrounding inflammatory changes. Spleen: Normal in size without focal abnormality. Adrenals/Urinary Tract: Adrenal glands are unremarkable. Horseshoe kidney with renal cortical scarring the LEFT renal moiety. Moieties enhance symmetrically. No hydronephrosis. Nonobstructing LEFT-sided nephrolithiasis. No obstructing nephrolithiasis visualized. Bladder is unremarkable. Stomach/Bowel: No evidence of bowel obstruction. Status post partial colectomy. Infant application. Small hiatal hernia. Status post appendectomy Vascular/Lymphatic: Atherosclerotic calcifications of the nonaneurysmal abdominal aorta. Dense atherosclerotic calcification at the origin of the celiac no suspicious adenopathy. Reproductive: Status post hysterectomy. No adnexal masses. Other: No free air or free fluid. Musculoskeletal: Degenerative changes of the lumbar spine with asymmetric disc space height loss at L4-5. Degenerative changes of the LEFT greater than RIGHT hip. Review of the MIP images confirms the above findings. IMPRESSION: 1. No acute pulmonary embolism. 2. Mild interlobular septal thickening and bronchial wall thickening may reflect mild pulmonary edema. 3. No acute findings in the abdomen or pelvis. 4. Horseshoe kidney with nonobstructing LEFT-sided nephrolithiasis. 5. Scattered tiny pulmonary nodules measuring up to 2-3 mm. No follow-up needed if patient is low-risk (and has no known or suspected primary neoplasm). Non-contrast chest CT can be considered in 12 months if patient is high-risk. This recommendation follows the consensus statement: Guidelines for  Management of Incidental Pulmonary Nodules Detected on CT Images: From the Fleischner Society 2017; Radiology 2017; 284:228-243. Aortic Atherosclerosis (ICD10-I70.0). Electronically Signed   By: Corean Salter M.D.   On: 02/06/2024 12:38   CT Angio Chest Pulmonary Embolism (PE) W or WO Contrast Result Date: 02/06/2024 CLINICAL DATA:  LLQ abdominal pain; Pulmonary embolism (PE) suspected, high prob EXAM: CT ANGIOGRAPHY CHEST CT ABDOMEN AND PELVIS WITH CONTRAST TECHNIQUE: Multidetector CT imaging of the chest was performed using the standard protocol during bolus administration of intravenous contrast. Multiplanar CT image reconstructions and MIPs were obtained to evaluate the vascular anatomy. Multidetector CT imaging of the abdomen and pelvis was performed using the standard protocol during bolus administration of intravenous contrast. RADIATION DOSE REDUCTION: This exam was performed according to the departmental dose-optimization program which includes automated  exposure control, adjustment of the mA and/or kV according to patient size and/or use of iterative reconstruction technique. CONTRAST:  OMNIPAQUE  IOHEXOL  350 MG/ML SOLN COMPARISON:  December 29, 2022 FINDINGS: CTA CHEST FINDINGS Cardiovascular: Predominately LEFT-sided coronary artery atherosclerotic calcifications. Atherosclerotic calcifications of the nonaneurysmal thoracic aorta. No pericardial effusion. Heart is unchanged in size. Adequate contrast opacification of the pulmonary arteries. No acute pulmonary embolism identified. Mildly limited evaluation of the distal pulmonary arteries secondary to motion. Mediastinum/Nodes: No axillary or mediastinal adenopathy. Lungs/Pleura: No pleural effusion or pneumothorax. Mild interlobular septal thickening. Mild bronchial wall thickening. Scattered tiny pulmonary nodules measuring up to 2-3 mm. Representative RIGHT upper lobe 2 mm pulmonary nodule (series 6, image 47) and representative 2 mm RIGHT  lower lobe pulmonary nodule (series 6, image 143). Representative 3 mm LEFT upper lobe pulmonary nodule (series 6, image 122). Musculoskeletal: No chest wall abnormality. No acute or significant osseous findings. Review of the MIP images confirms the above findings. CT ABDOMEN and PELVIS FINDINGS Hepatobiliary: Status post cholecystectomy. Mild central biliary ductal prominence with dilation of the common bile duct to 9 mm, similar compared to prior. Fatty deposition along the falciform ligament. Portal vein is patent. Pancreas: Unremarkable. No pancreatic ductal dilatation or surrounding inflammatory changes. Spleen: Normal in size without focal abnormality. Adrenals/Urinary Tract: Adrenal glands are unremarkable. Horseshoe kidney with renal cortical scarring the LEFT renal moiety. Moieties enhance symmetrically. No hydronephrosis. Nonobstructing LEFT-sided nephrolithiasis. No obstructing nephrolithiasis visualized. Bladder is unremarkable. Stomach/Bowel: No evidence of bowel obstruction. Status post partial colectomy. Infant application. Small hiatal hernia. Status post appendectomy Vascular/Lymphatic: Atherosclerotic calcifications of the nonaneurysmal abdominal aorta. Dense atherosclerotic calcification at the origin of the celiac no suspicious adenopathy. Reproductive: Status post hysterectomy. No adnexal masses. Other: No free air or free fluid. Musculoskeletal: Degenerative changes of the lumbar spine with asymmetric disc space height loss at L4-5. Degenerative changes of the LEFT greater than RIGHT hip. Review of the MIP images confirms the above findings. IMPRESSION: 1. No acute pulmonary embolism. 2. Mild interlobular septal thickening and bronchial wall thickening may reflect mild pulmonary edema. 3. No acute findings in the abdomen or pelvis. 4. Horseshoe kidney with nonobstructing LEFT-sided nephrolithiasis. 5. Scattered tiny pulmonary nodules measuring up to 2-3 mm. No follow-up needed if patient is  low-risk (and has no known or suspected primary neoplasm). Non-contrast chest CT can be considered in 12 months if patient is high-risk. This recommendation follows the consensus statement: Guidelines for Management of Incidental Pulmonary Nodules Detected on CT Images: From the Fleischner Society 2017; Radiology 2017; 284:228-243. Aortic Atherosclerosis (ICD10-I70.0). Electronically Signed   By: Corean Salter M.D.   On: 02/06/2024 12:38   CT Head Wo Contrast Result Date: 02/06/2024 EXAM: CT HEAD WITHOUT CONTRAST 02/06/2024 09:53:19 AM TECHNIQUE: CT of the head was performed without the administration of intravenous contrast. Automated exposure control, iterative reconstruction, and/or weight based adjustment of the mA/kV was utilized to reduce the radiation dose to as low as reasonably achievable. COMPARISON: CT of the head dated 08/12/2023. CLINICAL HISTORY: Head trauma, minor (Age >= 65y). From home for syncope, hit head, +blood thinners. N/v/d started yesterday. Chest pain radiating to left arm started yesterday. Hypotensive 98/50 with EMS. FINDINGS: BRAIN AND VENTRICLES: No acute hemorrhage. No evidence of acute infarct. No hydrocephalus. No extra-axial collection. No mass effect or midline shift. The patient is status post left frontoparietal craniotomy. A partially calcified soft tissue is again demonstrated deep to the left craniotomy flap, which appears unchanged. There is mild periventricular  white matter disease. There are calcifications within the carotid siphons. ORBITS: No acute abnormality. SINUSES: No acute abnormality. SOFT TISSUES AND SKULL: No acute soft tissue abnormality. No skull fracture. IMPRESSION: 1. No acute intracranial abnormality. 2. Status post left frontoparietal craniotomy with unchanged partially calcified soft tissue deep to the left craniotomy flap. 3. Mild periventricular white matter disease. Electronically signed by: Evalene Coho MD 02/06/2024 10:19 AM EDT RP  Workstation: [id number]   CT Cervical Spine Wo Contrast Result Date: 02/06/2024 CLINICAL DATA:  Neck trauma (Age >= 65y) Syncopal episode with fall.  On blood thinners. EXAM: CT CERVICAL SPINE WITHOUT CONTRAST TECHNIQUE: Multidetector CT imaging of the cervical spine was performed without intravenous contrast. Multiplanar CT image reconstructions were also generated. RADIATION DOSE REDUCTION: This exam was performed according to the departmental dose-optimization program which includes automated exposure control, adjustment of the mA and/or kV according to patient size and/or use of iterative reconstruction technique. COMPARISON:  CT cervical spine 08/12/2023. MRI cervical spine 09/20/2023. FINDINGS: Alignment: Reversal of cervical lordosis, similar to previous CT. No focal angulation or listhesis. Skull base and vertebrae: No evidence of acute cervical spine fracture or traumatic subluxation. Probable chronic interbody and interfacetal ankylosis bilaterally at C3-4. Soft tissues and spinal canal: No prevertebral fluid or swelling. No visible canal hematoma. Disc levels: Multilevel spondylosis again noted with disc space narrowing, uncinate spurring and facet hypertrophy, greatest at C5-6, C6-7 and C7-T1. Scattered mild foraminal narrowing. No large disc herniation or high-grade spinal stenosis demonstrated. Upper chest: Clear lung apices. Other: Mild carotid atherosclerosis. IMPRESSION: 1. No evidence of acute cervical spine fracture, traumatic subluxation or static signs of instability. 2. Multilevel cervical spondylosis as described. Electronically Signed   By: Elsie Perone M.D.   On: 02/06/2024 10:00   DG Chest Portable 1 View Result Date: 02/06/2024 EXAM: 1 VIEW(S) XRAY OF THE CHEST 02/06/2024 09:12:00 AM COMPARISON: 07/07/2023 CLINICAL HISTORY: chest pain. Table formatting from the original note was not included.; Pt to ED [facility] from home for syncope, hit head, +blood thinners. N/v/d started  yesterday. Chest pain radiating to left arm started yesterday. Hypotensive 98/50 with ems, 20g L AC with EMS, 20g R wrist with EMS. A fib 130-160. LR PTA. Cbg 136; 4mg  zofran  PTA. FINDINGS: LUNGS AND PLEURA: No focal pulmonary opacity. No pulmonary edema. No pleural effusion. No pneumothorax. HEART AND MEDIASTINUM: Aortic arch calcifications. BONES AND SOFT TISSUES: Surgical clips in upper abdomen. No acute osseous abnormality. IMPRESSION: 1. No acute cardiopulmonary process. Electronically signed by: Waddell Calk MD 02/06/2024 09:19 AM EDT RP Workstation: [id number]    EKG: A-fib with RVR  ASSESSMENT AND PLAN:  Atrial fibrillation with RVR Mild troponin elevation, likely nonischemic myocardial injury Dehydration with vomiting/diarrhea Syncope-in setting of dehydration and A-fib with RVR History of hypertension Hyperlipidemia Traumatic subdural hematoma status postcraniotomy 06/2023  Patient with recent symptoms of nausea/diarrhea who developed A-fib with RVR and had syncope this morning.  Also had mechanical fall couple days ago.  Patient with symptomatic A-fib with RVR.  Also dehydrated and received 2 L of IV fluids.  Heart rate continues to be elevated at 140 bpm with soft blood pressure With limited options, recommend to start amiodarone drip Continue to monitor on telemetry With her fall and syncope, CT head/neck done which are negative for acute process Restart home Eliquis  for anticoagulant Continue IV hydration  Signed: Keller JAYSON Paterson MD 02/06/2024, 12:52 PM

## 2024-02-06 NOTE — ED Provider Notes (Signed)
 [facility] Provider Note    Event Date/Time   First MD Initiated Contact with Patient 02/06/24 [phone]     (approximate)   History   Loss of Consciousness   HPI  [name] is a 73 y.o. female past medical history significant for hypertension, hyperlipidemia, atrial fibrillation on Eliquis , asthma, IBS, OSA, presents to the emergency department after syncopal episode.  Patient states that she fell backwards onto her recliner on Friday.  Does state that she hit her head.  States that she has been having lots of issues since Friday with nausea, vomiting and multiple episodes of nonbloody diarrhea.  Denies recent antibiotic use.  Does take anticoagulation for her atrial fibrillation.  Complaining of a headache and neck pain yesterday but her head feels better at this time.  Denies any extremity numbness or weakness.  Denies dysuria, urinary urgency or frequency.  States that she took all of her medications last night but did not take any medications this morning.  States that she has had ongoing chest pain since yesterday.  Radiating to her left side.  Does endorse a cough but no significant shortness of breath.  Denies any dizziness.  When EMS arrived patient had a soft blood pressure and was tachycardic with atrial fibrillation with a rapid rate.  Was given a 400 bolus with improvement of her blood pressure.  Afebrile.  Received Zofran  4 mg prior to arrival     Physical Exam   Triage Vital Signs: ED Triage Vitals [02/06/24 0903]  Encounter Vitals Group     BP      Girls Systolic BP Percentile      Girls Diastolic BP Percentile      Boys Systolic BP Percentile      Boys Diastolic BP Percentile      Pulse      Resp      Temp      Temp src      SpO2      Weight 144 lb 4.8 oz (65.5 kg)     Height      Head Circumference      Peak Flow      Pain Score 5     Pain Loc      Pain Education      Exclude from Growth Chart     Most recent vital  signs: Vitals:   02/06/24 1021 02/06/24 1140  BP: 120/73 92/68  Pulse: (!) 142 (!) 146  Resp: 17 17  Temp:    SpO2: 100% 94%    Physical Exam Constitutional:      Appearance: She is well-developed.  HENT:     Head: Atraumatic.  Eyes:     Extraocular Movements: Extraocular movements intact.     Conjunctiva/sclera: Conjunctivae normal.     Pupils: Pupils are equal, round, and reactive to light.  Cardiovascular:     Rate and Rhythm: Tachycardia present. Rhythm irregular.     Pulses: Normal pulses.  Pulmonary:     Effort: No respiratory distress.     Breath sounds: No wheezing.  Abdominal:     General: There is no distension.     Tenderness: There is abdominal tenderness (Left lower quadrant abdominal tenderness to palpation with no rebound or guarding).  Musculoskeletal:        General: Normal range of motion.     Cervical back: Normal range of motion. No tenderness.     Right lower leg: No edema.  Left lower leg: No edema.     Comments: No pain with bilateral palpation to the hips.  No pain with range of motion to bilateral lower extremities  Skin:    General: Skin is warm.     Capillary Refill: Capillary refill takes less than 2 seconds.  Neurological:     General: No focal deficit present.     Mental Status: She is alert. Mental status is at baseline.     IMPRESSION / MDM / ASSESSMENT AND PLAN / ED COURSE  I reviewed the triage vital signs and the nursing notes.  Differential diagnosis including atrial fibrillation with rapid rate, dehydration, ACS, anemia, intracranial hemorrhage, viral illness, COVID/influenza or diverticulitis, intra-abdominal abscess  EKG  I, Clotilda Punter, the attending physician, personally viewed and interpreted this ECG.  Atrial fibrillation with a rapid rate heart rate reading 142.  Diffuse ST depression.  QTc 475.  No significant ST elevation.  Atrial fibrillation with a rapid rate in the 160s and 150s while on cardiac  telemetry.  RADIOLOGY I independently reviewed imaging, my interpretation of imaging: CT scan of the head -no signs of intracranial hemorrhage  Chest x-ray  -no signs of pneumonia or pulmonary edema  CT scan cervical spine -read as no acute findings.  CT scan abdomen and pelvis with contrast  CTA PE  LABS (all labs ordered are listed, but only abnormal results are displayed) Labs interpreted as -    Labs Reviewed  CBC - Abnormal; Notable for the following components:      Result Value   WBC 11.0 (*)    All other components within normal limits  LACTIC ACID, PLASMA - Abnormal; Notable for the following components:   Lactic Acid, Venous 2.0 (*)    All other components within normal limits  COMPREHENSIVE METABOLIC PANEL WITH GFR - Abnormal; Notable for the following components:   Glucose, Bld 120 (*)    Calcium  8.4 (*)    AST 48 (*)    All other components within normal limits  PROTIME-INR - Abnormal; Notable for the following components:   Prothrombin Time 17.0 (*)    INR 1.3 (*)    All other components within normal limits  MAGNESIUM  - Abnormal; Notable for the following components:   Magnesium  1.4 (*)    All other components within normal limits  TROPONIN I (HIGH SENSITIVITY) - Abnormal; Notable for the following components:   Troponin I (High Sensitivity) 34 (*)    All other components within normal limits  RESP PANEL BY RT-PCR (RSV, FLU A&B, COVID)  RVPGX2  CULTURE, BLOOD (ROUTINE X 2)  CULTURE, BLOOD (ROUTINE X 2)  GASTROINTESTINAL PANEL BY PCR, STOOL (REPLACES STOOL CULTURE)  C DIFFICILE QUICK SCREEN W PCR REFLEX    LACTIC ACID, PLASMA  LIPASE, BLOOD  URINALYSIS, W/ REFLEX TO CULTURE (INFECTION SUSPECTED)  TROPONIN I (HIGH SENSITIVITY)     MDM  On arrival patient afebrile with significant tachycardia with a heart rate up to the 160s, patient is in A-fib with RVR.  Given that she has had recent diarrheal illness with copious amounts of diarrhea concerned that  she could be compensating for dehydration or underlying infectious process.  Patient received a 500 bolus prior to arrival.  Blood cultures obtained.  Felt that 30 cc/kg of IV fluids may be detrimental to the patient given her normal blood pressure, will give a 1 L bolus and reevaluate.  Continued to be tachycardia but did improve following IV fluids will give another  500 bolus.  Mild leukocytosis of 11.  Lactic acid mildly elevated at 2.  Troponin mildly elevated at 34, likely demand ischemia in the setting of atrial fibrillation with a rapid rate will get a second troponin.  COVID and influenza testing are negative.  Magnesium  1.4.  Given IV replacement.  Potassium within normal limits.  Started on antibiotics to cover for intra-abdominal pathology  Clinical Course as of 02/06/24 1245  Sun Feb 06, 2024  1021 Patient continues to be tachycardic, given another 500 bolus of IV fluids.  Repeat blood pressure improved [SM]    Clinical Course User Index [SM] Suzanne Kirsch, MD   Given her ongoing atrial fibrillation with a rapid rate and soft blood pressures discussed with cardiology on-call who recommended starting amlodipine  bolus and infusion and they will evaluate the patient today.  CTA with no signs of pulmonary embolism.  CT scan abdomen and pelvis with no intra-abdominal abscess.  Consulted hospitalist for admission for atrial fibrillation with a rapid rate, dehydration, electrolyte abnormality and diarrhea   PROCEDURES:  Critical Care performed: yes  .Critical Care  Performed by: Suzanne Kirsch, MD Authorized by: Suzanne Kirsch, MD   Critical care provider statement:    Critical care time (minutes):  60   Critical care time was exclusive of:  Separately billable procedures and treating other patients   Critical care was necessary to treat or prevent imminent or life-threatening deterioration of the following conditions:  Circulatory failure   Critical care was time spent  personally by me on the following activities:  Development of treatment plan with patient or surrogate, discussions with consultants, evaluation of patient's response to treatment, examination of patient, ordering and review of laboratory studies, ordering and review of radiographic studies, ordering and performing treatments and interventions, pulse oximetry, re-evaluation of patient's condition and review of old charts   Care discussed with: admitting provider     Patient's presentation is most consistent with acute presentation with potential threat to life or bodily function.   MEDICATIONS ORDERED IN ED: Medications  ondansetron  (ZOFRAN ) injection 4 mg (4 mg Intravenous Not Given 02/06/24 1108)  lactated ringers  infusion ( Intravenous New Bag/Given 02/06/24 1227)  metroNIDAZOLE  (FLAGYL ) IVPB 500 mg (500 mg Intravenous New Bag/Given 02/06/24 1241)  ondansetron  (ZOFRAN ) 4 MG/2ML injection (  Not Given 02/06/24 1132)  iohexol  (OMNIPAQUE ) 300 MG/ML solution 100 mL (has no administration in time range)  amiodarone (NEXTERONE) 1.8 mg/mL load via infusion 150 mg (has no administration in time range)  sodium chloride  0.9 % bolus 1,000 mL (0 mLs Intravenous Stopped 02/06/24 1132)  ondansetron  (ZOFRAN ) injection 4 mg (4 mg Intravenous Given 02/06/24 0921)  magnesium  sulfate IVPB 2 g 50 mL (0 g Intravenous Stopped 02/06/24 1136)  sodium chloride  0.9 % bolus 500 mL (0 mLs Intravenous Stopped 02/06/24 1132)  ondansetron  (ZOFRAN ) injection 4 mg (4 mg Intravenous Given 02/06/24 1033)  cefTRIAXone  (ROCEPHIN ) 2 g in sodium chloride  0.9 % 100 mL IVPB (0 g Intravenous Stopped 02/06/24 1147)  fentaNYL  (SUBLIMAZE ) injection 50 mcg (50 mcg Intravenous Given 02/06/24 1113)  lactated ringers  bolus 500 mL (0 mLs Intravenous Stopped 02/06/24 1223)  iohexol  (OMNIPAQUE ) 350 MG/ML injection 100 mL (100 mLs Intravenous Contrast Given 02/06/24 1153)  fentaNYL  (SUBLIMAZE ) injection 50 mcg (50 mcg Intravenous Given 02/06/24 1242)     FINAL CLINICAL IMPRESSION(S) / ED DIAGNOSES   Final diagnoses:  Atrial fibrillation with rapid ventricular response (HCC)  Hypomagnesemia     Rx / DC Orders   ED Discharge  Orders     None        Note:  This document was prepared using Dragon voice recognition software and may include unintentional dictation errors.   Suzanne Kirsch, MD 02/06/24 1245

## 2024-02-07 ENCOUNTER — Encounter

## 2024-02-07 ENCOUNTER — Ambulatory Visit

## 2024-02-07 ENCOUNTER — Encounter: Admitting: Speech Pathology

## 2024-02-07 DIAGNOSIS — F419 Anxiety disorder, unspecified: Secondary | ICD-10-CM | POA: Diagnosis not present

## 2024-02-07 DIAGNOSIS — K900 Celiac disease: Secondary | ICD-10-CM | POA: Diagnosis not present

## 2024-02-07 DIAGNOSIS — I10 Essential (primary) hypertension: Secondary | ICD-10-CM | POA: Diagnosis not present

## 2024-02-07 DIAGNOSIS — N200 Calculus of kidney: Secondary | ICD-10-CM | POA: Diagnosis not present

## 2024-02-07 DIAGNOSIS — I5A Non-ischemic myocardial injury (non-traumatic): Secondary | ICD-10-CM | POA: Diagnosis not present

## 2024-02-07 DIAGNOSIS — K589 Irritable bowel syndrome without diarrhea: Secondary | ICD-10-CM | POA: Diagnosis not present

## 2024-02-07 DIAGNOSIS — Z86711 Personal history of pulmonary embolism: Secondary | ICD-10-CM | POA: Diagnosis not present

## 2024-02-07 DIAGNOSIS — G4733 Obstructive sleep apnea (adult) (pediatric): Secondary | ICD-10-CM | POA: Diagnosis not present

## 2024-02-07 DIAGNOSIS — F32A Depression, unspecified: Secondary | ICD-10-CM | POA: Diagnosis not present

## 2024-02-07 DIAGNOSIS — K449 Diaphragmatic hernia without obstruction or gangrene: Secondary | ICD-10-CM | POA: Diagnosis not present

## 2024-02-07 DIAGNOSIS — I480 Paroxysmal atrial fibrillation: Secondary | ICD-10-CM | POA: Diagnosis not present

## 2024-02-07 DIAGNOSIS — J4489 Other specified chronic obstructive pulmonary disease: Secondary | ICD-10-CM | POA: Diagnosis not present

## 2024-02-07 DIAGNOSIS — J4540 Moderate persistent asthma, uncomplicated: Secondary | ICD-10-CM | POA: Diagnosis not present

## 2024-02-07 DIAGNOSIS — E785 Hyperlipidemia, unspecified: Secondary | ICD-10-CM | POA: Diagnosis not present

## 2024-02-07 DIAGNOSIS — K219 Gastro-esophageal reflux disease without esophagitis: Secondary | ICD-10-CM | POA: Diagnosis not present

## 2024-02-07 DIAGNOSIS — Z7951 Long term (current) use of inhaled steroids: Secondary | ICD-10-CM | POA: Diagnosis not present

## 2024-02-07 DIAGNOSIS — D649 Anemia, unspecified: Secondary | ICD-10-CM | POA: Diagnosis not present

## 2024-02-07 DIAGNOSIS — I4891 Unspecified atrial fibrillation: Secondary | ICD-10-CM | POA: Diagnosis not present

## 2024-02-07 DIAGNOSIS — R079 Chest pain, unspecified: Secondary | ICD-10-CM | POA: Diagnosis present

## 2024-02-07 DIAGNOSIS — E860 Dehydration: Secondary | ICD-10-CM | POA: Diagnosis not present

## 2024-02-07 DIAGNOSIS — Z79899 Other long term (current) drug therapy: Secondary | ICD-10-CM | POA: Diagnosis not present

## 2024-02-07 DIAGNOSIS — T466X5A Adverse effect of antihyperlipidemic and antiarteriosclerotic drugs, initial encounter: Secondary | ICD-10-CM | POA: Diagnosis not present

## 2024-02-07 DIAGNOSIS — I959 Hypotension, unspecified: Secondary | ICD-10-CM | POA: Diagnosis not present

## 2024-02-07 DIAGNOSIS — Z7901 Long term (current) use of anticoagulants: Secondary | ICD-10-CM | POA: Diagnosis not present

## 2024-02-07 DIAGNOSIS — I700 Atherosclerosis of aorta: Secondary | ICD-10-CM | POA: Diagnosis not present

## 2024-02-07 LAB — COMPREHENSIVE METABOLIC PANEL WITH GFR
ALT: 95 U/L — ABNORMAL HIGH (ref 0–44)
AST: 68 U/L — ABNORMAL HIGH (ref 15–41)
Albumin: 2.9 g/dL — ABNORMAL LOW (ref 3.5–5.0)
Alkaline Phosphatase: 77 U/L (ref 38–126)
Anion gap: 12 (ref 5–15)
BUN: 9 mg/dL (ref 8–23)
CO2: 20 mmol/L — ABNORMAL LOW (ref 22–32)
Calcium: 8 mg/dL — ABNORMAL LOW (ref 8.9–10.3)
Chloride: 106 mmol/L (ref 98–111)
Creatinine, Ser: 0.67 mg/dL (ref 0.44–1.00)
GFR, Estimated: >60 mL/min (ref >60)
Glucose, Bld: 92 mg/dL (ref 70–99)
Potassium: 3.6 mmol/L (ref 3.5–5.1)
Sodium: 138 mmol/L (ref 135–145)
Total Bilirubin: 0.5 mg/dL (ref 0.0–1.2)
Total Protein: 5 g/dL — ABNORMAL LOW (ref 6.5–8.1)

## 2024-02-07 LAB — ECHOCARDIOGRAM COMPLETE
AR max vel: 3.01 cm2
AV Peak grad: 7.8 mmHg
Ao pk vel: 1.4 m/s
Area-P 1/2: 4.65 cm2
Calc EF: 66.4 %
P 1/2 time: 326 ms
S' Lateral: 1.62 cm
Single Plane A2C EF: 65.6 %
Single Plane A4C EF: 68.1 %
Weight: 2308.8 [oz_av]

## 2024-02-07 LAB — CBC
HCT: 27.3 % — ABNORMAL LOW (ref 36.0–46.0)
Hemoglobin: 9.5 g/dL — ABNORMAL LOW (ref 12.0–15.0)
MCH: 31.1 pg (ref 26.0–34.0)
MCHC: 34.8 g/dL (ref 30.0–36.0)
MCV: 89.5 fL (ref 80.0–100.0)
Platelets: 162 K/uL (ref 150–400)
RBC: 3.05 MIL/uL — ABNORMAL LOW (ref 3.87–5.11)
RDW: 14 % (ref 11.5–15.5)
WBC: 7 K/uL (ref 4.0–10.5)
nRBC: 0 % (ref 0.0–0.2)

## 2024-02-07 LAB — CK: Total CK: 73 U/L (ref 38–234)

## 2024-02-07 LAB — MAGNESIUM: Magnesium: 1.8 mg/dL (ref 1.7–2.4)

## 2024-02-07 LAB — TROPONIN I (HIGH SENSITIVITY)
Troponin I (High Sensitivity): 323 ng/L (ref <18)
Troponin I (High Sensitivity): 306 ng/L (ref <18)

## 2024-02-07 MED ORDER — AMIODARONE HCL 200 MG PO TABS
400.0000 mg | ORAL_TABLET | Freq: Two times a day (BID) | ORAL | Status: DC
  Administered 2024-02-07: 400 mg via ORAL
  Filled 2024-02-07: qty 2

## 2024-02-07 MED ORDER — MAGNESIUM SULFATE 2 GM/50ML IV SOLN
2.0000 g | Freq: Once | INTRAVENOUS | Status: AC
Start: 2024-02-07 — End: 2024-02-07
  Administered 2024-02-07: 2 g via INTRAVENOUS
  Filled 2024-02-07: qty 50

## 2024-02-07 MED ORDER — AMIODARONE HCL 200 MG PO TABS: 200.0000 mg | ORAL_TABLET | Freq: Every day | ORAL | Status: DC

## 2024-02-07 MED ORDER — METOPROLOL SUCCINATE ER 25 MG PO TB24: 25.0000 mg | ORAL_TABLET | Freq: Two times a day (BID) | ORAL | 0 refills | Status: DC

## 2024-02-07 MED ORDER — AMIODARONE HCL 200 MG PO TABS: ORAL_TABLET | ORAL | 0 refills | Status: DC

## 2024-02-07 NOTE — Progress Notes (Addendum)
 Progress Note   Patient: [name] FMW:[id number] DOB: Apr 23, 1951 DOA: 02/06/2024     0 DOS: the patient was seen and examined on 02/07/2024   Brief hospital course:  [name] is a 73 y.o. female with medical history significant of hypertension, hyperlipidemia, GERD, atrial fibrillation, SVT, anemia, OSA, seizures, subdural hematoma status post craniotomy, anxiety, depression, asthma, esophageal stricture, IBS, celiac disease, DVT presenting with multiple complaints including nausea, vomiting, diarrhea, abdominal pain, chest pain, recent syncope.   Patient ports that since Friday she has had significant nausea vomiting with abdominal pain.  No recent antibiotics.  Diarrhea is nonbloody.   Reports a headache and some neck pain yesterday but that is resolved today.  She started having chest pain yesterday as well which persisted into today.   Patient had syncopal episode today.  She had and was walking into the kitchen and she woke up on the floor.  No reported prodrome.   Did not take her medications this morning.  Called EMS and was found to be tachycardic with low normal blood pressure and received 400 cc IV fluids.   Denies fevers, chills, shortness of breath, constipation   ED Course: Vital signs in the ED notable for blood pressure in the 90s-120s systolic, heart rate in the 130s-150s.   Lab workup included CMP with calcium  8.4, AST stable at 48, glucose 120.  CBC with mild leukocytosis to 11.  PT and INR mildly elevated at 17 and 1.3 respectively.  Troponin trend 34, 82.  Lactic acid 2, normal on repeat.  Lipase pending.  Respiratory panel for flu COVID and RSV negative.  Magnesium  1.4.  Urinalysis and blood cultures pending.  C. difficile panel and GI pathogen panel pending.   Chest x-ray showed no acute abnormality.  CTA PE showed no PE, scattered pulmonary nodules, questionable pulmonary edema.   CT abdomen pelvis showed horseshoe kidney, left renal stones, no acute  abnormality.   CT head showed no acute abnormality, status post craniotomy.  CT C-spine showed no acute abnormality.   Patient received fentanyl , ceftriaxone , Flagyl , Zofran , 1 L IV fluids, 1 g magnesium  in the ED.  Also started on 150 cc an hour of IV fluids.  Cardiology consulted for A-fib with RVR and recommended amiodarone bolus and infusion which has been ordered.       Assessment and Plan:  Principal Problem:   Atrial fibrillation with RVR (HCC) Active Problems:   Anxiety and depression   Primary hypertension   Irritable bowel syndrome   Hyperlipidemia   Gastroesophageal reflux disease without esophagitis   Dyslipidemia   GERD without esophagitis   Adult celiac disease   Chronic obstructive pulmonary disease, unspecified (HCC)   History of DVT (deep vein thrombosis)   Obstructive sleep apnea syndrome   Gastroenteritis   Syncope   RAD (reactive airway disease), moderate persistent, uncomplicated   Atrial fibrillation with RVR > Noted to be tachycardic by EMS on the day of admission with low normal blood pressures.   Brought to the ED and found to be in A-fib with RVR.  Rates in the 130s-150s. > Cardiology consulted and recommended amiodarone bolus and infusion which has been started with improvement in heart rates.  Patient remains on Eliquis . > Chest x-ray showed no acute abnormality.  CTA PE study showed no PE, scattered pulmonary nodules, questionable pulmonary edema.  Troponin trend 34, 82. > Possible trigger includes hypovolemia related to nausea vomiting and diarrhea -Appreciate cardiology recommendations and assistance - Continue metoprolol .  Cardiology to start oral amiodarone    Nausea, vomiting, diarrhea Abdominal pain > Suspected infectious gastroenteritis.  Resolved.  Patient able to tolerate p.o. > Distant history of IBS and celiac disease in chart.   Mild leukocytosis on admission which has since normalized.   > CT abdomen pelvis showed no acute  abnormality.  Demonstrated horseshoe kidney with some left-sided stones. - Monitoring on progressive as above - Patient received ceftriaxone  and Flagyl  which will be discontinued - Supportive care   Syncopal event > Had syncopal event on the day of admission.   Woke on the floor after walking to the kitchen.  No prodrome. - Most likely related to hypotension - Echocardiogram shows a normal LV EF of 65 to 70% with normal LV function - Will check orthostatic vital signs   Hypomagnesemia > Magnesium  1.4.  Secondary to GI losses Supplemented    History of subdural hematoma > Had midline shift and symptoms required surgical intervention.  Status post craniotomy. - Noted   Hypertension Patient is normotensive Continue to hold spironolactone   Continue metoprolol    Hyperlipidemia - Continue atorvastatin    GERD - Continue PPI   Anemia > Normal hemoglobin in the ED - Trend CBC   OSA - Does not tolerate CPAP per chart   IBS Celiac disease - Gluten-free diet   Anxiety Depression - Continue Celexa  and as needed Ativan    History of DVT - On Eliquis  as above   Reactive airway disease - Continue as needed albuterol  - Flovent   Transaminitis Probably related to atorvastatin  use Patient is status post cholecystectomy Monitor closely      Subjective: Feels better.  No further episodes of nausea, vomiting or diarrhea  Physical Exam: Vitals:   02/07/24 0622 02/07/24 0755 02/07/24 1010 02/07/24 1045  BP:  (!) 117/44  (!) 109/53  Pulse:  61  63  Resp:  13  (!) 23  Temp: 98.7 F (37.1 C)  98.4 F (36.9 C)   TempSrc: Oral  Oral   SpO2:  98%  91%  Weight:       Constitutional:      General: She is not in acute distress.    Appearance: Normal appearance.  HENT:     Head: Normocephalic and atraumatic.     Mouth/Throat:     Mouth: Mucous membranes are moist.     Pharynx: Oropharynx is clear.  Eyes:     Extraocular Movements: Extraocular movements intact.      Pupils: Pupils are equal, round, and reactive to light.  Cardiovascular:     Rate and Rhythm: S1, S2, irregularly irregular    Pulses: Normal pulses.     Heart sounds: Normal heart sounds.  Pulmonary:     Effort: Pulmonary effort is normal. No respiratory distress.     Breath sounds: Normal breath sounds.  Abdominal:     General: Bowel sounds are normal. There is no distension.     Palpations: Abdomen is soft.     Tenderness: Resolved Musculoskeletal:        General: No swelling or deformity.  Skin:    General: Skin is warm and dry.  Neurological:     General: No focal deficit present.     Mental Status: Mental status is at baseline.      Data Reviewed: Troponin 323, sodium 138, potassium 3.6, chloride 106, bicarb 20, glucose 92, BUN 9, creatinine 0.67, calcium  8.0, total protein 5.0, albumin 2.9, AST 68, ALT 95, alk phos 77, magnesium  1.8 Labs reviewed  Family Communication: Plan of care discussed with patient and her daughter at the bedside.  They verbalized understanding and agreed with the plan  Disposition: Status is: Inpatient Remains inpatient appropriate because: Possible discharge in a.m.  Planned Discharge Destination: Home    Time spent: 50 minutes  Author: Aimee Somerset, MD 02/07/2024 1:27 PM  For on call review [email or web address].

## 2024-02-07 NOTE — Progress Notes (Signed)
 [facility]INIC CARDIOLOGY PROGRESS NOTE   Patient ID: [name] MRN: [id number] DOB/AGE: Nov 08, 1950 73 y.o.  Admit date: 02/06/2024 Referring Physician Dr. Suzanne Primary Physician Cleotilde Oneil FALCON, MD  Primary Cardiologist Dr. Ammon Reason for Consultation AF RVR  HPI: [name] is a 73 y.o. female with a past medical history of paroxysmal atrial fibrillation, hypertension, hyperlipidemia, traumatic subdural hematoma status post craniotomy 06/2023 who presented to the ED on 02/06/2024 for syncope. Found to be in AF RVR, cardiology was consulted for further evaluation.   Interval History:  - Patient seen and examined this morning, resting comfortably in hospital bed. - She states that overall she is feeling better today, denies any palpitations or chest pain. - Echo results reviewed with patient this morning.  Review of systems complete and found to be negative unless listed above   Vitals:   02/07/24 0600 02/07/24 0622 02/07/24 0755 02/07/24 1010  BP: (!) 114/52  (!) 117/44   Pulse: 63  61   Resp: 19  13   Temp:  98.7 F (37.1 C)  98.4 F (36.9 C)  TempSrc:  Oral  Oral  SpO2: 94%  98%   Weight:        No intake or output data in the 24 hours ending 02/07/24 1051   PHYSICAL EXAM General: Well-appearing elderly female, well nourished, in no acute distress. HEENT: Normocephalic and atraumatic. Neck: No JVD.  Lungs: Normal respiratory effort on room air. Clear bilaterally to auscultation. No wheezes, crackles, rhonchi.  Heart: HRRR. Normal S1 and S2 without gallops or murmurs. Radial & DP pulses 2+ bilaterally. Abdomen: Non-distended appearing.  Msk: Normal strength and tone for age. Extremities: No clubbing, cyanosis or edema.   Neuro: Alert and oriented X 3. Psych: Mood appropriate, affect congruent.    LABS: Basic Metabolic Panel: Recent Labs    02/06/24 0907 02/07/24 0620  NA 139 138  K 3.6 3.6  CL 105 106  CO2 23 20*  GLUCOSE 120* 92  BUN 16 9   CREATININE 0.73 0.67  CALCIUM  8.4* 8.0*  MG 1.4* 1.8   Liver Function Tests: Recent Labs    02/06/24 0907 02/07/24 0620  AST 48* 68*  ALT 41 95*  ALKPHOS 86 77  BILITOT 1.0 0.5  PROT 6.5 5.0*  ALBUMIN 3.6 2.9*   Recent Labs    02/06/24 0907  LIPASE 24   CBC: Recent Labs    02/06/24 0907 02/07/24 0620  WBC 11.0* 7.0  HGB 12.3 9.5*  HCT 36.3 27.3*  MCV 91.0 89.5  PLT 222 162   Cardiac Enzymes: Recent Labs    02/06/24 1224 02/06/24 1713 02/07/24 0620  TROPONINIHS 82* 198* 323*   BNP: No results for input(s): BNP in the last 72 hours. D-Dimer: No results for input(s): DDIMER in the last 72 hours. Hemoglobin A1C: No results for input(s): HGBA1C in the last 72 hours. Fasting Lipid Panel: No results for input(s): CHOL, HDL, LDLCALC, TRIG, CHOLHDL, LDLDIRECT in the last 72 hours. Thyroid  Function Tests: No results for input(s): TSH, T4TOTAL, T3FREE, THYROIDAB in the last 72 hours.  Invalid input(s): FREET3 Anemia Panel: No results for input(s): VITAMINB12, FOLATE, FERRITIN, TIBC, IRON, RETICCTPCT in the last 72 hours.  ECHOCARDIOGRAM COMPLETE Result Date: 02/07/2024    ECHOCARDIOGRAM REPORT   Patient Name:   [name] Date of Exam: 02/06/2024 Medical Rec #:  [id number]      Height:       61.0 in Accession #:  [id number]     Weight:       144.3 lb Date of Birth:  02-14-1951     BSA:          1.644 m Patient Age:    72 years       BP:           86/63 mmHg Patient Gender: F              HR:           92 bpm. Exam Location:  [facility] Procedure: 2D Echo, Color Doppler, Cardiac Doppler and Intracardiac            Opacification Agent (Both Spectral and Color Flow Doppler were            utilized during procedure). Indications:     Atrial Fibrillation  History:         Patient has prior history of Echocardiogram examinations, most                  recent 07/01/2023. DVT, Arrythmias:Bradycardia and Tachycardia,                   Signs/Symptoms:Chest Pain and Shortness of Breath; Risk                  Factors:Dyslipidemia, Hypertension and Sleep Apnea.  Sonographer:     Logan Shove RDCS Referring Phys:  [id number] MARSA NOVAK MELVIN Diagnosing Phys: Keller Alluri IMPRESSIONS  1. Left ventricular ejection fraction, by estimation, is 65 to 70%. The left ventricle has normal function. The left ventricle has no regional wall motion abnormalities. Left ventricular diastolic parameters are indeterminate.  2. Right ventricular systolic function is normal. The right ventricular size is normal.  3. Left atrial size was mildly dilated.  4. The mitral valve is normal in structure. Moderate mitral valve regurgitation.  5. Tricuspid valve regurgitation is mild to moderate.  6. The aortic valve is tricuspid. Aortic valve regurgitation is mild. FINDINGS  Left Ventricle: Left ventricular ejection fraction, by estimation, is 65 to 70%. The left ventricle has normal function. The left ventricle has no regional wall motion abnormalities. Definity contrast agent was given IV to delineate the left ventricular  endocardial borders. The left ventricular internal cavity size was normal in size. There is no left ventricular hypertrophy. Left ventricular diastolic parameters are indeterminate. Right Ventricle: The right ventricular size is normal. No increase in right ventricular wall thickness. Right ventricular systolic function is normal. Left Atrium: Left atrial size was mildly dilated. Right Atrium: Right atrial size was normal in size. Pericardium: There is no evidence of pericardial effusion. Mitral Valve: The mitral valve is normal in structure. Moderate mitral valve regurgitation. Tricuspid Valve: The tricuspid valve is normal in structure. Tricuspid valve regurgitation is mild to moderate. Aortic Valve: The aortic valve is tricuspid. Aortic valve regurgitation is mild. Aortic regurgitation PHT measures 326 msec. Aortic valve peak gradient measures 7.8 mmHg.  Pulmonic Valve: The pulmonic valve was not well visualized. Pulmonic valve regurgitation is trivial. Aorta: The aortic root and ascending aorta are structurally normal, with no evidence of dilitation. IAS/Shunts: The atrial septum is grossly normal.  LEFT VENTRICLE PLAX 2D LVIDd:         3.16 cm     Diastology LVIDs:         1.62 cm     LV e' medial:    8.22 cm/s LV PW:         1.05 cm  LV E/e' medial:  17.4 LV IVS:        1.04 cm     LV e' lateral:   7.74 cm/s LVOT diam:     2.00 cm     LV E/e' lateral: 18.5 LVOT Area:     3.14 cm  LV Volumes (MOD) LV vol d, MOD A2C: 84.8 ml LV vol d, MOD A4C: 94.3 ml LV vol s, MOD A2C: 29.2 ml LV vol s, MOD A4C: 30.1 ml LV SV MOD A2C:     55.6 ml LV SV MOD A4C:     94.3 ml LV SV MOD BP:      62.1 ml RIGHT VENTRICLE             IVC RV Basal diam:  2.60 cm     IVC diam: 1.43 cm RV S prime:     10.40 cm/s TAPSE (M-mode): 2.0 cm LEFT ATRIUM             Index        RIGHT ATRIUM           Index LA diam:        3.00 cm 1.82 cm/m   RA Area:     11.60 cm LA Vol (A2C):   49.6 ml 30.17 ml/m  RA Volume:   28.20 ml  17.15 ml/m LA Vol (A4C):   56.3 ml 34.24 ml/m LA Biplane Vol: 55.5 ml 33.76 ml/m  AORTIC VALVE AV Area (Vmax): 3.01 cm AV Vmax:        140.00 cm/s AV Peak Grad:   7.8 mmHg LVOT Vmax:      134.00 cm/s AI PHT:         326 msec  AORTA Ao Root diam: 2.40 cm Ao Asc diam:  3.10 cm MITRAL VALVE                TRICUSPID VALVE MV Area (PHT): 4.65 cm     TR Peak grad:   21.0 mmHg MV Decel Time: 163 msec     TR Vmax:        229.00 cm/s MV E velocity: 143.00 cm/s MV A velocity: 58.20 cm/s   SHUNTS MV E/A ratio:  2.46         Systemic Diam: 2.00 cm Keller Paterson Electronically signed by Keller Paterson Signature Date/Time: 02/07/2024/8:22:26 AM    Final    CT ABDOMEN PELVIS W CONTRAST Result Date: 02/06/2024 CLINICAL DATA:  LLQ abdominal pain; Pulmonary embolism (PE) suspected, high prob EXAM: CT ANGIOGRAPHY CHEST CT ABDOMEN AND PELVIS WITH CONTRAST TECHNIQUE: Multidetector CT  imaging of the chest was performed using the standard protocol during bolus administration of intravenous contrast. Multiplanar CT image reconstructions and MIPs were obtained to evaluate the vascular anatomy. Multidetector CT imaging of the abdomen and pelvis was performed using the standard protocol during bolus administration of intravenous contrast. RADIATION DOSE REDUCTION: This exam was performed according to the departmental dose-optimization program which includes automated exposure control, adjustment of the mA and/or kV according to patient size and/or use of iterative reconstruction technique. CONTRAST:  OMNIPAQUE  IOHEXOL  350 MG/ML SOLN COMPARISON:  December 29, 2022 FINDINGS: CTA CHEST FINDINGS Cardiovascular: Predominately LEFT-sided coronary artery atherosclerotic calcifications. Atherosclerotic calcifications of the nonaneurysmal thoracic aorta. No pericardial effusion. Heart is unchanged in size. Adequate contrast opacification of the pulmonary arteries. No acute pulmonary embolism identified. Mildly limited evaluation of the distal pulmonary arteries secondary to motion. Mediastinum/Nodes: No axillary or mediastinal adenopathy. Lungs/Pleura: No pleural  effusion or pneumothorax. Mild interlobular septal thickening. Mild bronchial wall thickening. Scattered tiny pulmonary nodules measuring up to 2-3 mm. Representative RIGHT upper lobe 2 mm pulmonary nodule (series 6, image 47) and representative 2 mm RIGHT lower lobe pulmonary nodule (series 6, image 143). Representative 3 mm LEFT upper lobe pulmonary nodule (series 6, image 122). Musculoskeletal: No chest wall abnormality. No acute or significant osseous findings. Review of the MIP images confirms the above findings. CT ABDOMEN and PELVIS FINDINGS Hepatobiliary: Status post cholecystectomy. Mild central biliary ductal prominence with dilation of the common bile duct to 9 mm, similar compared to prior. Fatty deposition along the falciform  ligament. Portal vein is patent. Pancreas: Unremarkable. No pancreatic ductal dilatation or surrounding inflammatory changes. Spleen: Normal in size without focal abnormality. Adrenals/Urinary Tract: Adrenal glands are unremarkable. Horseshoe kidney with renal cortical scarring the LEFT renal moiety. Moieties enhance symmetrically. No hydronephrosis. Nonobstructing LEFT-sided nephrolithiasis. No obstructing nephrolithiasis visualized. Bladder is unremarkable. Stomach/Bowel: No evidence of bowel obstruction. Status post partial colectomy. Infant application. Small hiatal hernia. Status post appendectomy Vascular/Lymphatic: Atherosclerotic calcifications of the nonaneurysmal abdominal aorta. Dense atherosclerotic calcification at the origin of the celiac no suspicious adenopathy. Reproductive: Status post hysterectomy. No adnexal masses. Other: No free air or free fluid. Musculoskeletal: Degenerative changes of the lumbar spine with asymmetric disc space height loss at L4-5. Degenerative changes of the LEFT greater than RIGHT hip. Review of the MIP images confirms the above findings. IMPRESSION: 1. No acute pulmonary embolism. 2. Mild interlobular septal thickening and bronchial wall thickening may reflect mild pulmonary edema. 3. No acute findings in the abdomen or pelvis. 4. Horseshoe kidney with nonobstructing LEFT-sided nephrolithiasis. 5. Scattered tiny pulmonary nodules measuring up to 2-3 mm. No follow-up needed if patient is low-risk (and has no known or suspected primary neoplasm). Non-contrast chest CT can be considered in 12 months if patient is high-risk. This recommendation follows the consensus statement: Guidelines for Management of Incidental Pulmonary Nodules Detected on CT Images: From the Fleischner Society 2017; Radiology 2017; 284:228-243. Aortic Atherosclerosis (ICD10-I70.0). Electronically Signed   By: Corean Salter M.D.   On: 02/06/2024 12:38   CT Angio Chest Pulmonary Embolism (PE) W or  WO Contrast Result Date: 02/06/2024 CLINICAL DATA:  LLQ abdominal pain; Pulmonary embolism (PE) suspected, high prob EXAM: CT ANGIOGRAPHY CHEST CT ABDOMEN AND PELVIS WITH CONTRAST TECHNIQUE: Multidetector CT imaging of the chest was performed using the standard protocol during bolus administration of intravenous contrast. Multiplanar CT image reconstructions and MIPs were obtained to evaluate the vascular anatomy. Multidetector CT imaging of the abdomen and pelvis was performed using the standard protocol during bolus administration of intravenous contrast. RADIATION DOSE REDUCTION: This exam was performed according to the departmental dose-optimization program which includes automated exposure control, adjustment of the mA and/or kV according to patient size and/or use of iterative reconstruction technique. CONTRAST:  OMNIPAQUE  IOHEXOL  350 MG/ML SOLN COMPARISON:  December 29, 2022 FINDINGS: CTA CHEST FINDINGS Cardiovascular: Predominately LEFT-sided coronary artery atherosclerotic calcifications. Atherosclerotic calcifications of the nonaneurysmal thoracic aorta. No pericardial effusion. Heart is unchanged in size. Adequate contrast opacification of the pulmonary arteries. No acute pulmonary embolism identified. Mildly limited evaluation of the distal pulmonary arteries secondary to motion. Mediastinum/Nodes: No axillary or mediastinal adenopathy. Lungs/Pleura: No pleural effusion or pneumothorax. Mild interlobular septal thickening. Mild bronchial wall thickening. Scattered tiny pulmonary nodules measuring up to 2-3 mm. Representative RIGHT upper lobe 2 mm pulmonary nodule (series 6, image 47) and representative 2 mm RIGHT lower lobe pulmonary  nodule (series 6, image 143). Representative 3 mm LEFT upper lobe pulmonary nodule (series 6, image 122). Musculoskeletal: No chest wall abnormality. No acute or significant osseous findings. Review of the MIP images confirms the above findings. CT ABDOMEN and PELVIS  FINDINGS Hepatobiliary: Status post cholecystectomy. Mild central biliary ductal prominence with dilation of the common bile duct to 9 mm, similar compared to prior. Fatty deposition along the falciform ligament. Portal vein is patent. Pancreas: Unremarkable. No pancreatic ductal dilatation or surrounding inflammatory changes. Spleen: Normal in size without focal abnormality. Adrenals/Urinary Tract: Adrenal glands are unremarkable. Horseshoe kidney with renal cortical scarring the LEFT renal moiety. Moieties enhance symmetrically. No hydronephrosis. Nonobstructing LEFT-sided nephrolithiasis. No obstructing nephrolithiasis visualized. Bladder is unremarkable. Stomach/Bowel: No evidence of bowel obstruction. Status post partial colectomy. Infant application. Small hiatal hernia. Status post appendectomy Vascular/Lymphatic: Atherosclerotic calcifications of the nonaneurysmal abdominal aorta. Dense atherosclerotic calcification at the origin of the celiac no suspicious adenopathy. Reproductive: Status post hysterectomy. No adnexal masses. Other: No free air or free fluid. Musculoskeletal: Degenerative changes of the lumbar spine with asymmetric disc space height loss at L4-5. Degenerative changes of the LEFT greater than RIGHT hip. Review of the MIP images confirms the above findings. IMPRESSION: 1. No acute pulmonary embolism. 2. Mild interlobular septal thickening and bronchial wall thickening may reflect mild pulmonary edema. 3. No acute findings in the abdomen or pelvis. 4. Horseshoe kidney with nonobstructing LEFT-sided nephrolithiasis. 5. Scattered tiny pulmonary nodules measuring up to 2-3 mm. No follow-up needed if patient is low-risk (and has no known or suspected primary neoplasm). Non-contrast chest CT can be considered in 12 months if patient is high-risk. This recommendation follows the consensus statement: Guidelines for Management of Incidental Pulmonary Nodules Detected on CT Images: From the Fleischner  Society 2017; Radiology 2017; 284:228-243. Aortic Atherosclerosis (ICD10-I70.0). Electronically Signed   By: Corean Salter M.D.   On: 02/06/2024 12:38   CT Head Wo Contrast Result Date: 02/06/2024 EXAM: CT HEAD WITHOUT CONTRAST 02/06/2024 09:53:19 AM TECHNIQUE: CT of the head was performed without the administration of intravenous contrast. Automated exposure control, iterative reconstruction, and/or weight based adjustment of the mA/kV was utilized to reduce the radiation dose to as low as reasonably achievable. COMPARISON: CT of the head dated 08/12/2023. CLINICAL HISTORY: Head trauma, minor (Age >= 65y). From home for syncope, hit head, +blood thinners. N/v/d started yesterday. Chest pain radiating to left arm started yesterday. Hypotensive 98/50 with EMS. FINDINGS: BRAIN AND VENTRICLES: No acute hemorrhage. No evidence of acute infarct. No hydrocephalus. No extra-axial collection. No mass effect or midline shift. The patient is status post left frontoparietal craniotomy. A partially calcified soft tissue is again demonstrated deep to the left craniotomy flap, which appears unchanged. There is mild periventricular white matter disease. There are calcifications within the carotid siphons. ORBITS: No acute abnormality. SINUSES: No acute abnormality. SOFT TISSUES AND SKULL: No acute soft tissue abnormality. No skull fracture. IMPRESSION: 1. No acute intracranial abnormality. 2. Status post left frontoparietal craniotomy with unchanged partially calcified soft tissue deep to the left craniotomy flap. 3. Mild periventricular white matter disease. Electronically signed by: Evalene Coho MD 02/06/2024 10:19 AM EDT RP Workstation: [id number]   CT Cervical Spine Wo Contrast Result Date: 02/06/2024 CLINICAL DATA:  Neck trauma (Age >= 65y) Syncopal episode with fall.  On blood thinners. EXAM: CT CERVICAL SPINE WITHOUT CONTRAST TECHNIQUE: Multidetector CT imaging of the cervical spine was performed without  intravenous contrast. Multiplanar CT image reconstructions were also generated. RADIATION DOSE REDUCTION:  This exam was performed according to the departmental dose-optimization program which includes automated exposure control, adjustment of the mA and/or kV according to patient size and/or use of iterative reconstruction technique. COMPARISON:  CT cervical spine 08/12/2023. MRI cervical spine 09/20/2023. FINDINGS: Alignment: Reversal of cervical lordosis, similar to previous CT. No focal angulation or listhesis. Skull base and vertebrae: No evidence of acute cervical spine fracture or traumatic subluxation. Probable chronic interbody and interfacetal ankylosis bilaterally at C3-4. Soft tissues and spinal canal: No prevertebral fluid or swelling. No visible canal hematoma. Disc levels: Multilevel spondylosis again noted with disc space narrowing, uncinate spurring and facet hypertrophy, greatest at C5-6, C6-7 and C7-T1. Scattered mild foraminal narrowing. No large disc herniation or high-grade spinal stenosis demonstrated. Upper chest: Clear lung apices. Other: Mild carotid atherosclerosis. IMPRESSION: 1. No evidence of acute cervical spine fracture, traumatic subluxation or static signs of instability. 2. Multilevel cervical spondylosis as described. Electronically Signed   By: Elsie Perone M.D.   On: 02/06/2024 10:00   DG Chest Portable 1 View Result Date: 02/06/2024 EXAM: 1 VIEW(S) XRAY OF THE CHEST 02/06/2024 09:12:00 AM COMPARISON: 07/07/2023 CLINICAL HISTORY: chest pain. Table formatting from the original note was not included.; Pt to ED [facility] from home for syncope, hit head, +blood thinners. N/v/d started yesterday. Chest pain radiating to left arm started yesterday. Hypotensive 98/50 with ems, 20g L AC with EMS, 20g R wrist with EMS. A fib 130-160. LR PTA. Cbg 136; 4mg  zofran  PTA. FINDINGS: LUNGS AND PLEURA: No focal pulmonary opacity. No pulmonary edema. No pleural effusion. No pneumothorax.  HEART AND MEDIASTINUM: Aortic arch calcifications. BONES AND SOFT TISSUES: Surgical clips in upper abdomen. No acute osseous abnormality. IMPRESSION: 1. No acute cardiopulmonary process. Electronically signed by: Waddell Calk MD 02/06/2024 09:19 AM EDT RP Workstation: [id number]     ECHO as above  TELEMETRY reviewed by me 02/07/24: Sinus rhythm rate 60s  EKG reviewed by me 02/07/24: Atrial fibrillation 142 bpm  DATA reviewed by me 02/07/24: last 24h vitals tele labs imaging I/O, hospitalist progress note  Principal Problem:   Atrial fibrillation with RVR (HCC) Active Problems:   Primary hypertension   Irritable bowel syndrome   Hyperlipidemia   Gastroesophageal reflux disease without esophagitis   Anxiety and depression   Dyslipidemia   GERD without esophagitis   Adult celiac disease   Chronic obstructive pulmonary disease, unspecified (HCC)   History of DVT (deep vein thrombosis)   Obstructive sleep apnea syndrome   Gastroenteritis   Syncope   RAD (reactive airway disease), moderate persistent, uncomplicated    ASSESSMENT AND PLAN: [name] is a 73 y.o. female with a past medical history of paroxysmal atrial fibrillation, hypertension, hyperlipidemia, traumatic subdural hematoma status post craniotomy 06/2023 who presented to the ED on 02/06/2024 for syncope. Found to be in AF RVR, cardiology was consulted for further evaluation.   # Atrial fibrillation RVR # Paroxysmal atrial fibrillation # Hypertension # Hyperlipidemia Patient initially presented after fall, found to be in A-fib RVR in the ED.  Initially started on IV fluids due to borderline hypotension and given limited options was started on IV amiodarone.  Converted overnight to normal sinus rhythm. - Will transition from IV amiodarone to p.o. 400 mg twice daily for 10 days followed by 200 mg daily for rhythm control. - Continue Eliquis  5 mg twice daily for stroke risk reduction.  Continue metoprolol  succinate 25  mg daily for rate control. - Continue atorvastatin  20 mg daily.  Can consider  discharge later today. Appointments scheduled with EP on 10/7 and Dr. Ammon on 10/31.  This patient's case was discussed and created with Dr. Florencio and he is in agreement.  Signed:  Danita Bloch, PA-C  02/07/2024, 10:51 AM [facility]

## 2024-02-07 NOTE — Discharge Summary (Signed)
 Physician Discharge Summary   Patient: [name] MRN: [id number] DOB: [birth year]  Admit date:     02/06/2024  Discharge date: 02/07/24  Discharge Physician: Raiana Pharris   PCP: Cleotilde Oneil FALCON, MD   Recommendations at discharge:   Take medications as recommended Keep scheduled follow-up appointment with cardiology  Discharge Diagnoses: Principal Problem:   Atrial fibrillation with RVR (HCC) Active Problems:   Anxiety and depression   Primary hypertension   Irritable bowel syndrome   Hyperlipidemia   Gastroesophageal reflux disease without esophagitis   Dyslipidemia   GERD without esophagitis   Adult celiac disease   Chronic obstructive pulmonary disease, unspecified (HCC)   History of DVT (deep vein thrombosis)   Obstructive sleep apnea syndrome   Gastroenteritis   Syncope   RAD (reactive airway disease), moderate persistent, uncomplicated   Atrial fibrillation with rapid ventricular response (HCC)  Resolved Problems:   HYPERTENSION, CONTROLLED  Hospital Course:  [name] is a 73 y.o. female with medical history significant of hypertension, hyperlipidemia, GERD, atrial fibrillation, SVT, anemia, OSA, seizures, subdural hematoma status post craniotomy, anxiety, depression, asthma, esophageal stricture, IBS, celiac disease, DVT presenting with multiple complaints including nausea, vomiting, diarrhea, abdominal pain, chest pain, recent syncope.   Patient ports that since Friday she has had significant nausea vomiting with abdominal pain.  No recent antibiotics.  Diarrhea is nonbloody.   Reports a headache and some neck pain yesterday but that is resolved today.  She started having chest pain yesterday as well which persisted into today.   Patient had syncopal episode today.  She had and was walking into the kitchen and she woke up on the floor.  No reported prodrome.   Did not take her medications this morning.  Called EMS and was found to be tachycardic with  low normal blood pressure and received 400 cc IV fluids.   Denies fevers, chills, shortness of breath, constipation   ED Course: Vital signs in the ED notable for blood pressure in the 90s-120s systolic, heart rate in the 130s-150s.   Lab workup included CMP with calcium  8.4, AST stable at 48, glucose 120.  CBC with mild leukocytosis to 11.  PT and INR mildly elevated at 17 and 1.3 respectively.  Troponin trend 34, 82.  Lactic acid 2, normal on repeat.  Lipase pending.  Respiratory panel for flu COVID and RSV negative.  Magnesium  1.4.  Urinalysis and blood cultures pending.  C. difficile panel and GI pathogen panel pending.   Chest x-ray showed no acute abnormality.  CTA PE showed no PE, scattered pulmonary nodules, questionable pulmonary edema.   CT abdomen pelvis showed horseshoe kidney, left renal stones, no acute abnormality.   CT head showed no acute abnormality, status post craniotomy.  CT C-spine showed no acute abnormality.   Patient received fentanyl , ceftriaxone , Flagyl , Zofran , 1 L IV fluids, 1 g magnesium  in the ED.  Also started on 150 cc an hour of IV fluids.  Cardiology consulted for A-fib with RVR and recommended amiodarone bolus and infusion which has been ordered.      Assessment and Plan:  Atrial fibrillation with RVR > Noted to be tachycardic by EMS on the day of admission with low normal blood pressures.   Brought to the ED and found to be in A-fib with RVR.  Rates in the 130s-150s. > Cardiology consulted and recommended amiodarone bolus and infusion which was started with improvement in heart rate.  Patient remains on Eliquis .  Patient has been  weaned off the drip and is on oral amiodarone > Chest x-ray showed no acute abnormality.  CTA PE study showed no PE, scattered pulmonary nodules, questionable pulmonary edema.  Troponin trend 34, 82. > Possible trigger includes hypovolemia related to nausea vomiting and diarrhea -Appreciate cardiology recommendations and  assistance - Continue metoprolol  and amiodarone for rate control Follow-up with cardiology as an outpatient   Nausea, vomiting, diarrhea Abdominal pain > Suspected infectious gastroenteritis.  Resolved.  Patient able to tolerate p.o. > Distant history of IBS and celiac disease in chart.   Mild leukocytosis on admission which has since normalized.   > CT abdomen pelvis showed no acute abnormality.  Demonstrated horseshoe kidney with some left-sided stones. - Monitoring on progressive as above - Patient received ceftriaxone  and Flagyl  which will be discontinued - Supportive care   Syncopal event > Had syncopal event on the day of admission.   Woke on the floor after walking to the kitchen.  No prodrome. - Most likely related to hypotension - Echocardiogram shows a normal LV EF of 65 to 70% with normal LV function - Patient does not have any orthostatic blood pressure changes. Hold spironolactone  until resumed by PCP    Hypomagnesemia > Magnesium  1.4.  Secondary to GI losses Supplemented     History of subdural hematoma > Had midline shift and symptoms required surgical intervention.  Status post craniotomy. - Noted   Hypertension Patient is normotensive Continue to hold spironolactone   Continue metoprolol    Hyperlipidemia - Continue atorvastatin    GERD - Continue PPI   Anemia > Normal hemoglobin in the ED - Trend CBC   OSA - Does not tolerate CPAP per chart   IBS Celiac disease - Gluten-free diet   Anxiety Depression - Continue Celexa  and as needed Ativan    History of DVT - On Eliquis  as above   Reactive airway disease - Continue as needed albuterol  - Flovent    Transaminitis Probably related to atorvastatin  use Patient is status post cholecystectomy Monitor closely             Consultants: Cardiology Procedures performed: None Disposition: Home Diet recommendation:  Cardiac diet DISCHARGE MEDICATION: Allergies as of 02/07/2024        Reactions   Dicyclomine Other (See Comments)   REACTION: Choking Other Reaction(s): choking   Influenza Vaccines Anaphylaxis   Lisinopril Other (See Comments)   REACTION: Choking Other Reaction(s): choking   Losartan  Shortness Of Breath   Amitriptyline Other (See Comments)   Amitriptyline Hcl Other (See Comments)   Ciprofloxacin  Diarrhea, Other (See Comments)   Doxycycline Nausea And Vomiting   Escitalopram Other (See Comments)   Other Reaction(s): too strong   Ace Inhibitors Other (See Comments)   Unknown to patient   Penicillin G Rash   Penicillins Itching, Rash        Medication List     PAUSE taking these medications    spironolactone  25 MG tablet Wait to take this until your doctor or other care provider tells you to start again. Commonly known as: ALDACTONE  Take 25 mg by mouth daily.       TAKE these medications    albuterol  108 (90 Base) MCG/ACT inhaler Commonly known as: VENTOLIN  HFA Inhale 2 puffs into the lungs.   amiodarone 200 MG tablet Commonly known as: PACERONE Take 2 tablets (400 mg total) by mouth 2 (two) times daily for 9 days, THEN 1 tablet (200 mg total) daily. Start taking on: February 07, 2024   apixaban  5  MG Tabs tablet Commonly known as: ELIQUIS  Take 1 tablet (5 mg total) by mouth 2 (two) times daily.   atorvastatin  20 MG tablet Commonly known as: LIPITOR Take 1 tablet (20 mg total) by mouth daily with supper.   citalopram  20 MG tablet Commonly known as: CELEXA  Take 1 tablet (20 mg total) by mouth daily.   Ensure Max Protein Liqd Take 330 mLs (11 oz total) by mouth 2 (two) times daily.   fluticasone  110 MCG/ACT inhaler Commonly known as: FLOVENT  HFA Inhale 2 puffs into the lungs 2 (two) times daily.   LORazepam  0.5 MG tablet Commonly known as: ATIVAN  Take 0.5 mg by mouth every 8 (eight) hours as needed. For anxiety   metoprolol  succinate 25 MG 24 hr tablet Commonly known as: TOPROL -XL Take 1 tablet (25 mg total) by mouth  2 (two) times daily.   pantoprazole  40 MG tablet Commonly known as: PROTONIX  Take 40 mg by mouth.   vitamin D3 25 MCG tablet Commonly known as: CHOLECALCIFEROL  Take 4 tablets (4,000 Units total) by mouth 2 (two) times daily.        Follow-up Information     [facility]. Go in 1 week(s).   Why: Appointment scheduled for 02/15/24 at 3 PM Contact information: [address] [place], [place] [address] [phone]        Ammon Blunt, MD. Go in 1 month(s).   Specialty: Cardiology Why: Appointment scheduled for 03/10/24 at 10:15 AM Contact information: [address] [facility] [place] [address] [phone]                Discharge Exam: [name]   02/06/24 0903  Weight: 65.5 kg    General: She is not in acute distress.    Appearance: Normal appearance.  HENT:     Head: Normocephalic and atraumatic.     Mouth/Throat:     Mouth: Mucous membranes are moist.     Pharynx: Oropharynx is clear.  Eyes:     Extraocular Movements: Extraocular movements intact.     Pupils: Pupils are equal, round, and reactive to light.  Cardiovascular:     Rate and Rhythm: S1, S2, irregularly irregular    Pulses: Normal pulses.     Heart sounds: Normal heart sounds.  Pulmonary:     Effort: Pulmonary effort is normal. No respiratory distress.     Breath sounds: Normal breath sounds.  Abdominal:     General: Bowel sounds are normal. There is no distension.     Palpations: Abdomen is soft.     Tenderness: Resolved Musculoskeletal:        General: No swelling or deformity.  Skin:    General: Skin is warm and dry.  Neurological:     General: No focal deficit present.     Mental Status: Mental status is at baseline.         Condition at discharge: stable  The results of significant diagnostics from this hospitalization (including imaging, microbiology, ancillary and laboratory) are listed below for  reference.   Imaging Studies: ECHOCARDIOGRAM COMPLETE Result Date: 02/07/2024    ECHOCARDIOGRAM REPORT   Patient Name:   [name] Date of Exam: 02/06/2024 Medical Rec #:  [id number]      Height:       61.0 in Accession #:    [id number]     Weight:       144.3 lb Date of Birth:  03-10-1951     BSA:  1.644 m Patient Age:    72 years       BP:           86/63 mmHg Patient Gender: F              HR:           92 bpm. Exam Location:  [facility] Procedure: 2D Echo, Color Doppler, Cardiac Doppler and Intracardiac            Opacification Agent (Both Spectral and Color Flow Doppler were            utilized during procedure). Indications:     Atrial Fibrillation  History:         Patient has prior history of Echocardiogram examinations, most                  recent 07/01/2023. DVT, Arrythmias:Bradycardia and Tachycardia,                  Signs/Symptoms:Chest Pain and Shortness of Breath; Risk                  Factors:Dyslipidemia, Hypertension and Sleep Apnea.  Sonographer:     Logan Shove RDCS Referring Phys:  [id number] MARSA NOVAK MELVIN Diagnosing Phys: Keller Alluri IMPRESSIONS  1. Left ventricular ejection fraction, by estimation, is 65 to 70%. The left ventricle has normal function. The left ventricle has no regional wall motion abnormalities. Left ventricular diastolic parameters are indeterminate.  2. Right ventricular systolic function is normal. The right ventricular size is normal.  3. Left atrial size was mildly dilated.  4. The mitral valve is normal in structure. Moderate mitral valve regurgitation.  5. Tricuspid valve regurgitation is mild to moderate.  6. The aortic valve is tricuspid. Aortic valve regurgitation is mild. FINDINGS  Left Ventricle: Left ventricular ejection fraction, by estimation, is 65 to 70%. The left ventricle has normal function. The left ventricle has no regional wall motion abnormalities. Definity contrast agent was given IV to delineate the left ventricular  endocardial borders.  The left ventricular internal cavity size was normal in size. There is no left ventricular hypertrophy. Left ventricular diastolic parameters are indeterminate. Right Ventricle: The right ventricular size is normal. No increase in right ventricular wall thickness. Right ventricular systolic function is normal. Left Atrium: Left atrial size was mildly dilated. Right Atrium: Right atrial size was normal in size. Pericardium: There is no evidence of pericardial effusion. Mitral Valve: The mitral valve is normal in structure. Moderate mitral valve regurgitation. Tricuspid Valve: The tricuspid valve is normal in structure. Tricuspid valve regurgitation is mild to moderate. Aortic Valve: The aortic valve is tricuspid. Aortic valve regurgitation is mild. Aortic regurgitation PHT measures 326 msec. Aortic valve peak gradient measures 7.8 mmHg. Pulmonic Valve: The pulmonic valve was not well visualized. Pulmonic valve regurgitation is trivial. Aorta: The aortic root and ascending aorta are structurally normal, with no evidence of dilitation. IAS/Shunts: The atrial septum is grossly normal.  LEFT VENTRICLE PLAX 2D LVIDd:         3.16 cm     Diastology LVIDs:         1.62 cm     LV e' medial:    8.22 cm/s LV PW:         1.05 cm     LV E/e' medial:  17.4 LV IVS:        1.04 cm     LV e' lateral:   7.74 cm/s LVOT diam:  2.00 cm     LV E/e' lateral: 18.5 LVOT Area:     3.14 cm  LV Volumes (MOD) LV vol d, MOD A2C: 84.8 ml LV vol d, MOD A4C: 94.3 ml LV vol s, MOD A2C: 29.2 ml LV vol s, MOD A4C: 30.1 ml LV SV MOD A2C:     55.6 ml LV SV MOD A4C:     94.3 ml LV SV MOD BP:      62.1 ml RIGHT VENTRICLE             IVC RV Basal diam:  2.60 cm     IVC diam: 1.43 cm RV S prime:     10.40 cm/s TAPSE (M-mode): 2.0 cm LEFT ATRIUM             Index        RIGHT ATRIUM           Index LA diam:        3.00 cm 1.82 cm/m   RA Area:     11.60 cm LA Vol (A2C):   49.6 ml 30.17 ml/m  RA Volume:   28.20 ml  17.15 ml/m LA Vol (A4C):   56.3 ml  34.24 ml/m LA Biplane Vol: 55.5 ml 33.76 ml/m  AORTIC VALVE AV Area (Vmax): 3.01 cm AV Vmax:        140.00 cm/s AV Peak Grad:   7.8 mmHg LVOT Vmax:      134.00 cm/s AI PHT:         326 msec  AORTA Ao Root diam: 2.40 cm Ao Asc diam:  3.10 cm MITRAL VALVE                TRICUSPID VALVE MV Area (PHT): 4.65 cm     TR Peak grad:   21.0 mmHg MV Decel Time: 163 msec     TR Vmax:        229.00 cm/s MV E velocity: 143.00 cm/s MV A velocity: 58.20 cm/s   SHUNTS MV E/A ratio:  2.46         Systemic Diam: 2.00 cm Keller Paterson Electronically signed by Keller Paterson Signature Date/Time: 02/07/2024/8:22:26 AM    Final    CT ABDOMEN PELVIS W CONTRAST Result Date: 02/06/2024 CLINICAL DATA:  LLQ abdominal pain; Pulmonary embolism (PE) suspected, high prob EXAM: CT ANGIOGRAPHY CHEST CT ABDOMEN AND PELVIS WITH CONTRAST TECHNIQUE: Multidetector CT imaging of the chest was performed using the standard protocol during bolus administration of intravenous contrast. Multiplanar CT image reconstructions and MIPs were obtained to evaluate the vascular anatomy. Multidetector CT imaging of the abdomen and pelvis was performed using the standard protocol during bolus administration of intravenous contrast. RADIATION DOSE REDUCTION: This exam was performed according to the departmental dose-optimization program which includes automated exposure control, adjustment of the mA and/or kV according to patient size and/or use of iterative reconstruction technique. CONTRAST:  OMNIPAQUE  IOHEXOL  350 MG/ML SOLN COMPARISON:  December 29, 2022 FINDINGS: CTA CHEST FINDINGS Cardiovascular: Predominately LEFT-sided coronary artery atherosclerotic calcifications. Atherosclerotic calcifications of the nonaneurysmal thoracic aorta. No pericardial effusion. Heart is unchanged in size. Adequate contrast opacification of the pulmonary arteries. No acute pulmonary embolism identified. Mildly limited evaluation of the distal pulmonary arteries secondary to  motion. Mediastinum/Nodes: No axillary or mediastinal adenopathy. Lungs/Pleura: No pleural effusion or pneumothorax. Mild interlobular septal thickening. Mild bronchial wall thickening. Scattered tiny pulmonary nodules measuring up to 2-3 mm. Representative RIGHT upper lobe 2 mm pulmonary nodule (series 6, image 47)  and representative 2 mm RIGHT lower lobe pulmonary nodule (series 6, image 143). Representative 3 mm LEFT upper lobe pulmonary nodule (series 6, image 122). Musculoskeletal: No chest wall abnormality. No acute or significant osseous findings. Review of the MIP images confirms the above findings. CT ABDOMEN and PELVIS FINDINGS Hepatobiliary: Status post cholecystectomy. Mild central biliary ductal prominence with dilation of the common bile duct to 9 mm, similar compared to prior. Fatty deposition along the falciform ligament. Portal vein is patent. Pancreas: Unremarkable. No pancreatic ductal dilatation or surrounding inflammatory changes. Spleen: Normal in size without focal abnormality. Adrenals/Urinary Tract: Adrenal glands are unremarkable. Horseshoe kidney with renal cortical scarring the LEFT renal moiety. Moieties enhance symmetrically. No hydronephrosis. Nonobstructing LEFT-sided nephrolithiasis. No obstructing nephrolithiasis visualized. Bladder is unremarkable. Stomach/Bowel: No evidence of bowel obstruction. Status post partial colectomy. Infant application. Small hiatal hernia. Status post appendectomy Vascular/Lymphatic: Atherosclerotic calcifications of the nonaneurysmal abdominal aorta. Dense atherosclerotic calcification at the origin of the celiac no suspicious adenopathy. Reproductive: Status post hysterectomy. No adnexal masses. Other: No free air or free fluid. Musculoskeletal: Degenerative changes of the lumbar spine with asymmetric disc space height loss at L4-5. Degenerative changes of the LEFT greater than RIGHT hip. Review of the MIP images confirms the above findings.  IMPRESSION: 1. No acute pulmonary embolism. 2. Mild interlobular septal thickening and bronchial wall thickening may reflect mild pulmonary edema. 3. No acute findings in the abdomen or pelvis. 4. Horseshoe kidney with nonobstructing LEFT-sided nephrolithiasis. 5. Scattered tiny pulmonary nodules measuring up to 2-3 mm. No follow-up needed if patient is low-risk (and has no known or suspected primary neoplasm). Non-contrast chest CT can be considered in 12 months if patient is high-risk. This recommendation follows the consensus statement: Guidelines for Management of Incidental Pulmonary Nodules Detected on CT Images: From the Fleischner Society 2017; Radiology 2017; 284:228-243. Aortic Atherosclerosis (ICD10-I70.0). Electronically Signed   By: Corean Salter M.D.   On: 02/06/2024 12:38   CT Angio Chest Pulmonary Embolism (PE) W or WO Contrast Result Date: 02/06/2024 CLINICAL DATA:  LLQ abdominal pain; Pulmonary embolism (PE) suspected, high prob EXAM: CT ANGIOGRAPHY CHEST CT ABDOMEN AND PELVIS WITH CONTRAST TECHNIQUE: Multidetector CT imaging of the chest was performed using the standard protocol during bolus administration of intravenous contrast. Multiplanar CT image reconstructions and MIPs were obtained to evaluate the vascular anatomy. Multidetector CT imaging of the abdomen and pelvis was performed using the standard protocol during bolus administration of intravenous contrast. RADIATION DOSE REDUCTION: This exam was performed according to the departmental dose-optimization program which includes automated exposure control, adjustment of the mA and/or kV according to patient size and/or use of iterative reconstruction technique. CONTRAST:  OMNIPAQUE  IOHEXOL  350 MG/ML SOLN COMPARISON:  December 29, 2022 FINDINGS: CTA CHEST FINDINGS Cardiovascular: Predominately LEFT-sided coronary artery atherosclerotic calcifications. Atherosclerotic calcifications of the nonaneurysmal thoracic aorta. No  pericardial effusion. Heart is unchanged in size. Adequate contrast opacification of the pulmonary arteries. No acute pulmonary embolism identified. Mildly limited evaluation of the distal pulmonary arteries secondary to motion. Mediastinum/Nodes: No axillary or mediastinal adenopathy. Lungs/Pleura: No pleural effusion or pneumothorax. Mild interlobular septal thickening. Mild bronchial wall thickening. Scattered tiny pulmonary nodules measuring up to 2-3 mm. Representative RIGHT upper lobe 2 mm pulmonary nodule (series 6, image 47) and representative 2 mm RIGHT lower lobe pulmonary nodule (series 6, image 143). Representative 3 mm LEFT upper lobe pulmonary nodule (series 6, image 122). Musculoskeletal: No chest wall abnormality. No acute or significant osseous findings. Review of the MIP  images confirms the above findings. CT ABDOMEN and PELVIS FINDINGS Hepatobiliary: Status post cholecystectomy. Mild central biliary ductal prominence with dilation of the common bile duct to 9 mm, similar compared to prior. Fatty deposition along the falciform ligament. Portal vein is patent. Pancreas: Unremarkable. No pancreatic ductal dilatation or surrounding inflammatory changes. Spleen: Normal in size without focal abnormality. Adrenals/Urinary Tract: Adrenal glands are unremarkable. Horseshoe kidney with renal cortical scarring the LEFT renal moiety. Moieties enhance symmetrically. No hydronephrosis. Nonobstructing LEFT-sided nephrolithiasis. No obstructing nephrolithiasis visualized. Bladder is unremarkable. Stomach/Bowel: No evidence of bowel obstruction. Status post partial colectomy. Infant application. Small hiatal hernia. Status post appendectomy Vascular/Lymphatic: Atherosclerotic calcifications of the nonaneurysmal abdominal aorta. Dense atherosclerotic calcification at the origin of the celiac no suspicious adenopathy. Reproductive: Status post hysterectomy. No adnexal masses. Other: No free air or free fluid.  Musculoskeletal: Degenerative changes of the lumbar spine with asymmetric disc space height loss at L4-5. Degenerative changes of the LEFT greater than RIGHT hip. Review of the MIP images confirms the above findings. IMPRESSION: 1. No acute pulmonary embolism. 2. Mild interlobular septal thickening and bronchial wall thickening may reflect mild pulmonary edema. 3. No acute findings in the abdomen or pelvis. 4. Horseshoe kidney with nonobstructing LEFT-sided nephrolithiasis. 5. Scattered tiny pulmonary nodules measuring up to 2-3 mm. No follow-up needed if patient is low-risk (and has no known or suspected primary neoplasm). Non-contrast chest CT can be considered in 12 months if patient is high-risk. This recommendation follows the consensus statement: Guidelines for Management of Incidental Pulmonary Nodules Detected on CT Images: From the Fleischner Society 2017; Radiology 2017; 284:228-243. Aortic Atherosclerosis (ICD10-I70.0). Electronically Signed   By: Corean Salter M.D.   On: 02/06/2024 12:38   CT Head Wo Contrast Result Date: 02/06/2024 EXAM: CT HEAD WITHOUT CONTRAST 02/06/2024 09:53:19 AM TECHNIQUE: CT of the head was performed without the administration of intravenous contrast. Automated exposure control, iterative reconstruction, and/or weight based adjustment of the mA/kV was utilized to reduce the radiation dose to as low as reasonably achievable. COMPARISON: CT of the head dated 08/12/2023. CLINICAL HISTORY: Head trauma, minor (Age >= 65y). From home for syncope, hit head, +blood thinners. N/v/d started yesterday. Chest pain radiating to left arm started yesterday. Hypotensive 98/50 with EMS. FINDINGS: BRAIN AND VENTRICLES: No acute hemorrhage. No evidence of acute infarct. No hydrocephalus. No extra-axial collection. No mass effect or midline shift. The patient is status post left frontoparietal craniotomy. A partially calcified soft tissue is again demonstrated deep to the left craniotomy  flap, which appears unchanged. There is mild periventricular white matter disease. There are calcifications within the carotid siphons. ORBITS: No acute abnormality. SINUSES: No acute abnormality. SOFT TISSUES AND SKULL: No acute soft tissue abnormality. No skull fracture. IMPRESSION: 1. No acute intracranial abnormality. 2. Status post left frontoparietal craniotomy with unchanged partially calcified soft tissue deep to the left craniotomy flap. 3. Mild periventricular white matter disease. Electronically signed by: Evalene Coho MD 02/06/2024 10:19 AM EDT RP Workstation: [id number]   CT Cervical Spine Wo Contrast Result Date: 02/06/2024 CLINICAL DATA:  Neck trauma (Age >= 65y) Syncopal episode with fall.  On blood thinners. EXAM: CT CERVICAL SPINE WITHOUT CONTRAST TECHNIQUE: Multidetector CT imaging of the cervical spine was performed without intravenous contrast. Multiplanar CT image reconstructions were also generated. RADIATION DOSE REDUCTION: This exam was performed according to the departmental dose-optimization program which includes automated exposure control, adjustment of the mA and/or kV according to patient size and/or use of iterative reconstruction technique. COMPARISON:  CT cervical spine 08/12/2023. MRI cervical spine 09/20/2023. FINDINGS: Alignment: Reversal of cervical lordosis, similar to previous CT. No focal angulation or listhesis. Skull base and vertebrae: No evidence of acute cervical spine fracture or traumatic subluxation. Probable chronic interbody and interfacetal ankylosis bilaterally at C3-4. Soft tissues and spinal canal: No prevertebral fluid or swelling. No visible canal hematoma. Disc levels: Multilevel spondylosis again noted with disc space narrowing, uncinate spurring and facet hypertrophy, greatest at C5-6, C6-7 and C7-T1. Scattered mild foraminal narrowing. No large disc herniation or high-grade spinal stenosis demonstrated. Upper chest: Clear lung apices. Other: Mild  carotid atherosclerosis. IMPRESSION: 1. No evidence of acute cervical spine fracture, traumatic subluxation or static signs of instability. 2. Multilevel cervical spondylosis as described. Electronically Signed   By: Elsie Perone M.D.   On: 02/06/2024 10:00   DG Chest Portable 1 View Result Date: 02/06/2024 EXAM: 1 VIEW(S) XRAY OF THE CHEST 02/06/2024 09:12:00 AM COMPARISON: 07/07/2023 CLINICAL HISTORY: chest pain. Table formatting from the original note was not included.; Pt to ED [facility] from home for syncope, hit head, +blood thinners. N/v/d started yesterday. Chest pain radiating to left arm started yesterday. Hypotensive 98/50 with ems, 20g L AC with EMS, 20g R wrist with EMS. A fib 130-160. LR PTA. Cbg 136; 4mg  zofran  PTA. FINDINGS: LUNGS AND PLEURA: No focal pulmonary opacity. No pulmonary edema. No pleural effusion. No pneumothorax. HEART AND MEDIASTINUM: Aortic arch calcifications. BONES AND SOFT TISSUES: Surgical clips in upper abdomen. No acute osseous abnormality. IMPRESSION: 1. No acute cardiopulmonary process. Electronically signed by: Waddell Calk MD 02/06/2024 09:19 AM EDT RP Workstation: [id number]    Microbiology: Results for orders placed or performed during the hospital encounter of 02/06/24  Resp panel by RT-PCR (RSV, Flu A&B, Covid) Anterior Nasal Swab     Status: None   Collection Time: 02/06/24  9:08 AM   Specimen: Anterior Nasal Swab  Result Value Ref Range Status   SARS Coronavirus 2 by RT PCR NEGATIVE NEGATIVE Final    Comment: (NOTE) SARS-CoV-2 target nucleic acids are NOT DETECTED.  The SARS-CoV-2 RNA is generally detectable in upper respiratory specimens during the acute phase of infection. The lowest concentration of SARS-CoV-2 viral copies this assay can detect is 138 copies/mL. A negative result does not preclude SARS-Cov-2 infection and should not be used as the sole basis for treatment or other patient management decisions. A negative result may occur  with  improper specimen collection/handling, submission of specimen other than nasopharyngeal swab, presence of viral mutation(s) within the areas targeted by this assay, and inadequate number of viral copies(<138 copies/mL). A negative result must be combined with clinical observations, patient history, and epidemiological information. The expected result is Negative.  Fact Sheet for Patients:  BloggerCourse.com  Fact Sheet for Healthcare Providers:  SeriousBroker.it  This test is no t yet approved or cleared by the [place]  FDA and  has been authorized for detection and/or diagnosis of SARS-CoV-2 by FDA under an Emergency Use Authorization (EUA). This EUA will remain  in effect (meaning this test can be used) for the duration of the COVID-19 declaration under Section 564(b)(1) of the Act, 21 U.S.C.section 360bbb-3(b)(1), unless the authorization is terminated  or revoked sooner.       Influenza A by PCR NEGATIVE NEGATIVE Final   Influenza B by PCR NEGATIVE NEGATIVE Final    Comment: (NOTE) The Xpert Xpress SARS-CoV-2/FLU/RSV plus assay is intended as an aid in the diagnosis of influenza from Nasopharyngeal swab specimens and should  not be used as a sole basis for treatment. Nasal washings and aspirates are unacceptable for Xpert Xpress SARS-CoV-2/FLU/RSV testing.  Fact Sheet for Patients: BloggerCourse.com  Fact Sheet for Healthcare Providers: SeriousBroker.it  This test is not yet approved or cleared by the [place]  FDA and has been authorized for detection and/or diagnosis of SARS-CoV-2 by FDA under an Emergency Use Authorization (EUA). This EUA will remain in effect (meaning this test can be used) for the duration of the COVID-19 declaration under Section 564(b)(1) of the Act, 21 U.S.C. section 360bbb-3(b)(1), unless the authorization is terminated  or revoked.     Resp Syncytial Virus by PCR NEGATIVE NEGATIVE Final    Comment: (NOTE) Fact Sheet for Patients: BloggerCourse.com  Fact Sheet for Healthcare Providers: SeriousBroker.it  This test is not yet approved or cleared by the [place]  FDA and has been authorized for detection and/or diagnosis of SARS-CoV-2 by FDA under an Emergency Use Authorization (EUA). This EUA will remain in effect (meaning this test can be used) for the duration of the COVID-19 declaration under Section 564(b)(1) of the Act, 21 U.S.C. section 360bbb-3(b)(1), unless the authorization is terminated or revoked.  Performed at [facility], [address] [place], [place] [address]   Blood Culture (routine x 2)     Status: None (Preliminary result)   Collection Time: 02/06/24  9:14 AM   Specimen: BLOOD  Result Value Ref Range Status   Specimen Description BLOOD RIGHT ANTECUBITAL  Final   Special Requests   Final    BOTTLES DRAWN AEROBIC AND ANAEROBIC Blood Culture adequate volume   Culture   Final    NO GROWTH < 24 HOURS Performed at [facility], [address] [place], [place] [address]    Report Status PENDING  Incomplete  Blood Culture (routine x 2)     Status: None (Preliminary result)   Collection Time: 02/06/24  9:23 AM   Specimen: BLOOD  Result Value Ref Range Status   Specimen Description BLOOD BLOOD LEFT HAND  Final   Special Requests   Final    BOTTLES DRAWN AEROBIC AND ANAEROBIC Blood Culture adequate volume   Culture   Final    NO GROWTH < 24 HOURS Performed at [facility], [address] [place], [place] [address]    Report Status PENDING  Incomplete    Labs: CBC: Recent Labs  Lab 02/06/24 0907 02/07/24 0620  WBC 11.0* 7.0  HGB 12.3 9.5*  HCT 36.3 27.3*  MCV 91.0 89.5  PLT 222 162   Basic Metabolic Panel: Recent Labs  Lab 02/06/24 0907 02/07/24 0620  NA 139 138  K 3.6 3.6   CL 105 106  CO2 23 20*  GLUCOSE 120* 92  BUN 16 9  CREATININE 0.73 0.67  CALCIUM  8.4* 8.0*  MG 1.4* 1.8   Liver Function Tests: Recent Labs  Lab 02/06/24 0907 02/07/24 0620  AST 48* 68*  ALT 41 95*  ALKPHOS 86 77  BILITOT 1.0 0.5  PROT 6.5 5.0*  ALBUMIN 3.6 2.9*   CBG: No results for input(s): GLUCAP in the last 168 hours.  Discharge time spent: greater than 30 minutes.  Signed: Aimee Somerset, MD [facility] 02/07/2024

## 2024-02-07 NOTE — Evaluation (Signed)
 Physical Therapy Evaluation Patient Details Name: [name] MRN: [id number] DOB: Oct 02, 1950 Today's Date: 02/07/2024  History of Present Illness  [name] is a 73 y.o. female with medical history significant of hypertension, hyperlipidemia, GERD, atrial fibrillation, SVT, anemia, OSA, seizures, subdural hematoma status post craniotomy, anxiety, depression, asthma, esophageal stricture, IBS, celiac disease, DVT presenting with multiple complaints including nausea, vomiting, diarrhea, abdominal pain, chest pain, recent syncope.     Patient ports that since Friday she has had significant nausea vomiting with abdominal pain.  No recent antibiotics.  Diarrhea is nonbloody.     Reports a headache and some neck pain yesterday but that is resolved today.  She started having chest pain yesterday as well which persisted into today.     Patient had syncopal episode today.  She had and was walking into the kitchen and she woke up on the floor.  No reported prodrome.  Clinical Impression  Pt is a pleasant 73 year old female who was admitted for Afib with RVR. Pt demonstrates all bed mobility/transfers/ambulation at baseline level. Vitals WNL during session. She reports she feels at 100% and is hopeful to dc from the hospital today. Pt does not require any further PT needs at this time. Pt will be dc in house and does not require follow up. RN aware. Will dc current orders.         If plan is discharge home, recommend the following:     Can travel by private vehicle        Equipment Recommendations None recommended by PT  Recommendations for Other Services       Functional Status Assessment Patient has not had a recent decline in their functional status     Precautions / Restrictions Precautions Precautions: Fall Recall of Precautions/Restrictions: Intact Restrictions Weight Bearing Restrictions Per Provider Order: No      Mobility  Bed Mobility Overal bed mobility: Independent              General bed mobility comments: safe technique    Transfers Overall transfer level: Independent Equipment used: None               General transfer comment: safe technique    Ambulation/Gait Ambulation/Gait assistance: Independent Gait Distance (Feet): 100 Feet Assistive device: None Gait Pattern/deviations: WFL(Within Functional Limits)       General Gait Details: ambulated in hallway with normal gait/station. Able to carry conversation with exertion without LOB.  Stairs            Wheelchair Mobility     Tilt Bed    Modified Rankin (Stroke Patients Only)       Balance Overall balance assessment: Independent                                           Pertinent Vitals/Pain Pain Assessment Pain Assessment: No/denies pain    Home Living Family/patient expects to be discharged to:: Private residence Living Arrangements: Alone   Type of Home: Mobile home Home Access: Stairs to enter Entrance Stairs-Rails: Right;Left;Can reach both Entrance Stairs-Number of Steps: 5   Home Layout: One level Home Equipment: Agricultural consultant (2 wheels)      Prior Function Prior Level of Function : Independent/Modified Independent;Driving             Mobility Comments: independent prior to admission ADLs Comments: independent  Extremity/Trunk Assessment   Upper Extremity Assessment Upper Extremity Assessment: Overall WFL for tasks assessed    Lower Extremity Assessment Lower Extremity Assessment: Overall WFL for tasks assessed       Communication   Communication Communication: No apparent difficulties    Cognition Arousal: Alert Behavior During Therapy: WFL for tasks assessed/performed   PT - Cognitive impairments: No apparent impairments                       PT - Cognition Comments: pleasant and agreeable to session Following commands: Intact       Cueing Cueing Techniques: Verbal cues      General Comments      Exercises     Assessment/Plan    PT Assessment Patient does not need any further PT services  PT Problem List         PT Treatment Interventions      PT Goals (Current goals can be found in the Care Plan section)  Acute Rehab PT Goals Patient Stated Goal: to go home ASAP PT Goal Formulation: All assessment and education complete, DC therapy Time For Goal Achievement: 02/07/24 Potential to Achieve Goals: Good    Frequency       Co-evaluation               AM-PAC PT 6 Clicks Mobility  Outcome Measure Help needed turning from your back to your side while in a flat bed without using bedrails?: None Help needed moving from lying on your back to sitting on the side of a flat bed without using bedrails?: None Help needed moving to and from a bed to a chair (including a wheelchair)?: None Help needed standing up from a chair using your arms (e.g., wheelchair or bedside chair)?: None Help needed to walk in hospital room?: None Help needed climbing 3-5 steps with a railing? : None 6 Click Score: 24    End of Session   Activity Tolerance: Patient tolerated treatment well Patient left: in bed Nurse Communication: Mobility status PT Visit Diagnosis: Muscle weakness (generalized) (M62.81)    Time: 8450-8440 PT Time Calculation (min) (ACUTE ONLY): 10 min   Charges:   PT Evaluation $PT Eval Low Complexity: 1 Low   PT General Charges $$ ACUTE PT VISIT: 1 Visit         Corean Dade, PT, DPT, [facility] [phone]   Yasheka Fossett 02/07/2024, 4:13 PM

## 2024-02-09 ENCOUNTER — Encounter

## 2024-02-09 ENCOUNTER — Encounter: Admitting: Speech Pathology

## 2024-02-09 ENCOUNTER — Ambulatory Visit

## 2024-02-11 LAB — CULTURE, BLOOD (ROUTINE X 2)
Culture: NO GROWTH
Culture: NO GROWTH
Special Requests: ADEQUATE
Special Requests: ADEQUATE

## 2024-02-14 ENCOUNTER — Encounter: Admitting: Speech Pathology

## 2024-02-14 ENCOUNTER — Ambulatory Visit

## 2024-02-14 ENCOUNTER — Encounter

## 2024-02-16 ENCOUNTER — Encounter

## 2024-02-16 ENCOUNTER — Telehealth: Payer: Self-pay

## 2024-02-16 ENCOUNTER — Encounter: Admitting: Speech Pathology

## 2024-02-16 ENCOUNTER — Ambulatory Visit

## 2024-02-16 NOTE — Telephone Encounter (Signed)
    DOS: 06/25/23 (L craniotomy for SDH evacuation) by Dr Clois

## 2024-02-21 ENCOUNTER — Encounter: Admitting: Speech Pathology

## 2024-02-21 ENCOUNTER — Ambulatory Visit

## 2024-02-21 ENCOUNTER — Encounter

## 2024-02-23 ENCOUNTER — Ambulatory Visit

## 2024-02-23 ENCOUNTER — Encounter

## 2024-02-23 ENCOUNTER — Encounter: Admitting: Speech Pathology

## 2024-02-28 ENCOUNTER — Encounter: Admitting: Speech Pathology

## 2024-02-28 ENCOUNTER — Encounter

## 2024-02-28 ENCOUNTER — Ambulatory Visit

## 2024-03-01 ENCOUNTER — Ambulatory Visit

## 2024-03-01 ENCOUNTER — Encounter: Admitting: Speech Pathology

## 2024-03-01 ENCOUNTER — Encounter

## 2024-03-13 ENCOUNTER — Other Ambulatory Visit: Payer: Self-pay | Admitting: Cardiology

## 2024-03-13 DIAGNOSIS — R079 Chest pain, unspecified: Secondary | ICD-10-CM

## 2024-03-13 DIAGNOSIS — I2089 Other forms of angina pectoris: Secondary | ICD-10-CM

## 2024-03-21 ENCOUNTER — Telehealth: Payer: Self-pay | Admitting: Neurosurgery

## 2024-03-21 NOTE — Telephone Encounter (Signed)
 Patient is experiencing ongoing symptoms including dizziness, unsteady gait, persistent headaches, fatigue, nausea, and a general lack of motivation. She reports that any attempt to engage in activity exacerbates her discomfort. These symptoms have been present since her ER visit in September, where she was hospitalized for two days and prescribed amiodarone (PACERONE) 200 mg. She suspects this medication may be contributing to her worsening headaches. She is seeking an appointment as soon as possible and would like guidance on whether she should be scheduled with Dr. CINDERELLA Houston, or whichever provider can see her the quickest.

## 2024-03-21 NOTE — Telephone Encounter (Signed)
 Patient advised.

## 2024-03-21 NOTE — Telephone Encounter (Signed)
 Last seen by Danielle x 2, not sure if this is an appointment for us  or to see a PCP for her symptoms? Will let provider review for feedback

## 2024-03-22 NOTE — Progress Notes (Signed)
 Chief Complaint  Patient presents with  . Headache    4/10   . Dizziness  . Nausea    Patient is agreeable to [employer] scribe.   History of Present Illness [name] is a 73 year old female with atrial fibrillation who presents with headache and dizziness. Her brother is with her for the visit.  She has been experiencing severe headaches in various parts of her head, describing them as 'something's in there just pounding it.' These headaches have persisted since her hospitalization on September 28th for atrial fibrillation. No new or progressive vision changes, but she has a history of cataracts.  She experiences dizziness and shakiness, feeling 'sick' and needing to bring a pan with her in case of vomiting. Although she has not vomited, she feels nauseous and too unwell to drive herself to the appointment.  She has a history of atrial fibrillation and was hospitalized on September 28th with a heart rate of 150 beats per minute. She was started on amiodarone, initially at 200 mg for nine days, then increased to 800 mg daily. She has since reduced the dose to 100 mg daily. Despite the reduction, she continues to feel unwell.  She reports numbness and tingling in her feet and hands, with occasional numbness extending up her arm. She experiences chest pain occasionally, which occurs both during activity and at rest.  She has a history of brain surgery and was hospitalized for 31 days. She experiences anxiety and stress. She lives alone and fears about her health and the possibility of falling.  She reports poor appetite and hydration, with dry mouth and increased urinary frequency, getting up hourly at night. No bloody or black stools.    ROS  Review of systems is unremarkable for any active cardiac, respiratory, GI, GU, hematologic, neurologic, dermatologic, HEENT, or psychiatric symptoms except as noted above.  No fevers, chills, or constitutional symptoms.   Current Outpatient  Medications  Medication Sig Dispense Refill  . albuterol  MDI, PROVENTIL , VENTOLIN , PROAIR , HFA 90 mcg/actuation inhaler Inhale 2 inhalations into the lungs every 6 (six) hours as needed 1 each 11  . AMIOdarone (PACERONE) 200 MG tablet Take 200 mg by mouth 2 (two) times daily    . apixaban  (ELIQUIS ) 5 mg tablet TAKE 1 TABLET BY MOUTH EVERY 12 HOURS 60 tablet 5  . atorvastatin  (LIPITOR) 20 MG tablet Take 1 tablet (20 mg total) by mouth once daily 90 tablet 3  . citalopram  (CELEXA ) 20 MG tablet Take 1 tablet (20 mg total) by mouth once daily 90 tablet 3  . fluticasone  propionate (FLOVENT  HFA) 110 mcg/actuation inhaler Inhale 2 inhalations into the lungs 2 (two) times daily 12 g 11  . LORazepam  (ATIVAN ) 0.5 MG tablet Take 1 tablet (0.5 mg total) by mouth once daily as needed 30 tablet 5  . metoprolol  TARTrate (LOPRESSOR ) 25 MG tablet Take 1 dose (25 mg total) by mouth once along with your morning dose of beta-blocker on the day of your heart CT exam. 1 tablet 0  . pantoprazole  (PROTONIX ) 40 MG DR tablet Take 1 tablet (40 mg total) by mouth once daily 30 tablet 11  . cholecalciferol  (VITAMIN D3) 400 unit tablet Take 4,000 Units by mouth once daily (Patient not taking: Reported on 03/22/2024)    . diclofenac  (VOLTAREN ) 1 % topical gel Apply 2 g topically 4 (four) times daily (Patient not taking: Reported on 03/22/2024)    . metoprolol  SUCCinate (TOPROL -XL) 25 MG XL tablet Take 25 mg  by mouth 2 (two) times daily (Patient not taking: Reported on 03/22/2024)    . pantoprazole  (PROTONIX ) 40 MG DR tablet Take 40 mg by mouth once daily (Patient not taking: Reported on 03/22/2024)    . predniSONE (DELTASONE) 10 MG tablet 40mg  x 2 days, 30mg  x 2days, 20mg  x 2days, 10mg  x 2days (Patient not taking: Reported on 03/22/2024) 20 tablet 0   No current facility-administered medications for this visit.    Allergies as of 03/22/2024 - Reviewed 03/22/2024  Allergen Reaction Noted  . Influenza a (h1n1) vac 2009  Anaphylaxis 03/29/2023  . Losartan  Shortness Of Breath 11/16/2017  . Amitriptyline Hallucination 10/20/2018  . Ciprofloxacin  Unknown 02/18/2020  . Doxycycline Vomiting 10/21/2021  . Lexapro [escitalopram] Unknown 02/18/2020  . Lisinopril Other (See Comments) 09/26/2013  . Penicillins Rash 09/26/2013    Patient Active Problem List  Diagnosis  . GERD (gastroesophageal reflux disease)  . IBS (irritable bowel syndrome)  . Hyperlipidemia, mixed  . Benign essential hypertension  . Chest pain with high risk for cardiac etiology  . Major depressive disorder, recurrent, mild ()  . Medicare annual wellness visit, initial  . Lumbar disc disease  . RAD (reactive airway disease), moderate persistent, uncomplicated (HHS-HCC)  . Family history of coronary artery disease  . History of 2019 novel coronavirus disease (COVID-19)  . B12 deficiency  . Iron deficiency anemia due to chronic blood loss  . Paroxysmal atrial fibrillation (CMS/HHS-HCC)  . Subdural hematoma (CMS-HCC)    Past Medical History:  Diagnosis Date  . Anxiety   . Arthritis   . Asthma, unspecified asthma severity, unspecified whether complicated, unspecified whether persistent (HHS-HCC)   . Depression   . Diverticulitis   . GERD (gastroesophageal reflux disease)   . Hyperlipidemia   . Hypertension   . IBS (irritable bowel syndrome)   . Sleep apnea   . Thrombosis    Left Leg    Past Surgical History:  Procedure Laterality Date  . COLONOSCOPY  01/27/2021   Diverticulosis/Otherwise normal/No repeat due to age/TKT  . EGD  01/27/2021   Nissen fundoplication was found. The wrap appears tight.Dilated/Repeat as needed/TKT  . EGD @ [facility]  01/25/2023   Gastritis/Esophagela stenosis.Dilated/No repeat/TKT  . APPENDECTOMY    . CHOLECYSTECTOMY    . COLON SURGERY     partial, diverticulitis  . HYSTERECTOMY    . Nissen fundoplication      Vitals:   03/22/24 1150 03/22/24 1213  BP: (!) 214/80 (!) 190/70  Pulse: 68   SpO2:  97%   Weight: 62.2 kg (137 lb 3.2 oz)   Height: 154.9 cm (5' 1)   PainSc:   4   PainLoc: Head - Entire    Body mass index is 25.92 kg/m.  Exam BP (!) 190/70   Pulse 68   Ht 154.9 cm (5' 1)   Wt 62.2 kg (137 lb 3.2 oz)   SpO2 97%   BMI 25.92 kg/m   General. Well appearing; NAD; VS reviewed     Eyes. Sclera and conjunctiva clear; Vision grossly intact; extraocular movements intact Oropharynx. No suspicious lesions Neck. Supple. No swelling, masses, thyroid  normal size, no masses palpated.   Lungs. Respirations unlabored; clear to auscultation bilaterally Cardiovascular. Heart regular rate and rhythm without murmurs, gallops, or rubs Extremities: without edema and with 2+ pulses bilaterally Skin. Normal color and turgor Neurologic. Alert and oriented x3; CN 2-12 grossly intact; no focal deficits  Assessment & Plan  Headache, dizziness, and nausea Chronic symptoms since hospital stay  in September possibly due to hypertension, stress, anxiety. Electrolyte imbalance and medication side effects considered, but amiodarone unlikely due to dose reduction and no improvement. - Checked electrolytes and urine test. - Rechecked blood pressure. - Advised emergency care for severe symptoms.  Essential hypertension Blood pressure elevated, contributing to headache and dizziness. Spironolactone  previously discontinued due to hypotension, now reconsidered due to current readings. - Restarted spironolactone . - Monitor blood pressure at home. - Checked labs today.  Paroxysmal atrial fibrillation Recent AFib hospitalization, currently on amiodarone 100 mg daily. No significant EKG changes, dose reduction not improving symptoms. - Continue amiodarone 100 mg daily. - Monitor for symptom changes.    F/U: Patient to follow-up as scheduled with PCP.  Will give an update on blood pressure readings over the next week.  JASON HESTLE WHITAKER, PA  This note has been created using automated  tools and reviewed for accuracy by JASON HESTLE WHITAKER.   Note: This dictation was prepared with Dragon dictation along with smaller phrase technology. Any transcriptional errors that result from this process are unintentional.

## 2024-03-29 ENCOUNTER — Telehealth (HOSPITAL_COMMUNITY): Payer: Self-pay | Admitting: *Deleted

## 2024-03-29 NOTE — Telephone Encounter (Signed)
 Reaching out to patient to offer assistance regarding upcoming cardiac imaging study; patient reports EMS is currently on the way due to BP issues.  She will call back to review instructions or r/s test pending evaluation. Sid Seats RN Navigator Cardiac Imaging [facility] [phone] office [phone] cell

## 2024-03-30 ENCOUNTER — Emergency Department: Admission: EM | Admit: 2024-03-30 | Discharge: 2024-03-30 | Disposition: A | Discharge status: Home / Self Care

## 2024-03-30 ENCOUNTER — Encounter: Payer: Self-pay | Admitting: Emergency Medicine

## 2024-03-30 ENCOUNTER — Ambulatory Visit
Admission: RE | Admit: 2024-03-30 | Discharge: 2024-03-30 | Disposition: A | Discharge status: Home / Self Care | Source: Ambulatory Visit | Attending: Cardiology | Admitting: Cardiology

## 2024-03-30 ENCOUNTER — Emergency Department

## 2024-03-30 ENCOUNTER — Other Ambulatory Visit: Payer: Self-pay

## 2024-03-30 DIAGNOSIS — R079 Chest pain, unspecified: Secondary | ICD-10-CM | POA: Diagnosis present

## 2024-03-30 DIAGNOSIS — R112 Nausea with vomiting, unspecified: Secondary | ICD-10-CM | POA: Diagnosis not present

## 2024-03-30 DIAGNOSIS — J45909 Unspecified asthma, uncomplicated: Secondary | ICD-10-CM | POA: Diagnosis not present

## 2024-03-30 DIAGNOSIS — I10 Essential (primary) hypertension: Secondary | ICD-10-CM | POA: Diagnosis not present

## 2024-03-30 DIAGNOSIS — R110 Nausea: Secondary | ICD-10-CM

## 2024-03-30 DIAGNOSIS — R1084 Generalized abdominal pain: Secondary | ICD-10-CM | POA: Insufficient documentation

## 2024-03-30 DIAGNOSIS — R519 Headache, unspecified: Secondary | ICD-10-CM | POA: Diagnosis not present

## 2024-03-30 DIAGNOSIS — R509 Fever, unspecified: Secondary | ICD-10-CM | POA: Diagnosis not present

## 2024-03-30 DIAGNOSIS — R109 Unspecified abdominal pain: Secondary | ICD-10-CM

## 2024-03-30 LAB — CBC
HCT: 36.7 % (ref 36.0–46.0)
Hemoglobin: 12.7 g/dL (ref 12.0–15.0)
MCH: 30.8 pg (ref 26.0–34.0)
MCHC: 34.6 g/dL (ref 30.0–36.0)
MCV: 88.9 fL (ref 80.0–100.0)
Platelets: 211 K/uL (ref 150–400)
RBC: 4.13 MIL/uL (ref 3.87–5.11)
RDW: 13.8 % (ref 11.5–15.5)
WBC: 7.2 K/uL (ref 4.0–10.5)
nRBC: 0 % (ref 0.0–0.2)

## 2024-03-30 LAB — COMPREHENSIVE METABOLIC PANEL WITH GFR
ALT: 15 U/L (ref 0–44)
AST: 18 U/L (ref 15–41)
Albumin: 4.4 g/dL (ref 3.5–5.0)
Alkaline Phosphatase: 90 U/L (ref 38–126)
Anion gap: 10 (ref 5–15)
BUN: 10 mg/dL (ref 8–23)
CO2: 27 mmol/L (ref 22–32)
Calcium: 9.4 mg/dL (ref 8.9–10.3)
Chloride: 102 mmol/L (ref 98–111)
Creatinine, Ser: 0.81 mg/dL (ref 0.44–1.00)
GFR, Estimated: >60 mL/min (ref >60)
Glucose, Bld: 112 mg/dL — ABNORMAL HIGH (ref 70–99)
Potassium: 3.6 mmol/L (ref 3.5–5.1)
Sodium: 138 mmol/L (ref 135–145)
Total Bilirubin: 0.6 mg/dL (ref 0.0–1.2)
Total Protein: 6.9 g/dL (ref 6.5–8.1)

## 2024-03-30 LAB — URINALYSIS, ROUTINE W REFLEX MICROSCOPIC
Bilirubin Urine: NEGATIVE
Glucose, UA: NEGATIVE mg/dL
Hgb urine dipstick: NEGATIVE
Ketones, ur: NEGATIVE mg/dL
Leukocytes,Ua: NEGATIVE
Nitrite: NEGATIVE
Protein, ur: 30 mg/dL — AB
Specific Gravity, Urine: 1.04 — ABNORMAL HIGH (ref 1.005–1.030)
pH: 6 (ref 5.0–8.0)

## 2024-03-30 LAB — TSH: TSH: 1.39 u[IU]/mL (ref 0.350–4.500)

## 2024-03-30 LAB — RESP PANEL BY RT-PCR (RSV, FLU A&B, COVID)  RVPGX2
Influenza A by PCR: NEGATIVE
Influenza B by PCR: NEGATIVE
Resp Syncytial Virus by PCR: NEGATIVE
SARS Coronavirus 2 by RT PCR: NEGATIVE

## 2024-03-30 LAB — TROPONIN T, HIGH SENSITIVITY
Troponin T High Sensitivity: <15 ng/L (ref 0–19)
Troponin T High Sensitivity: <15 ng/L (ref 0–19)

## 2024-03-30 LAB — LIPASE, BLOOD: Lipase: 24 U/L (ref 11–51)

## 2024-03-30 LAB — T4, FREE: Free T4: 1.8 ng/dL — ABNORMAL HIGH (ref 0.61–1.12)

## 2024-03-30 LAB — PROCALCITONIN: Procalcitonin: <0.1 ng/mL

## 2024-03-30 MED ORDER — LIDOCAINE 5 % EX PTCH: 1.0000 | MEDICATED_PATCH | CUTANEOUS | 0 refills | Status: AC

## 2024-03-30 MED ORDER — MORPHINE SULFATE (PF) 4 MG/ML IV SOLN
4.0000 mg | Freq: Once | INTRAVENOUS | Status: AC
  Administered 2024-03-30: 4 mg via INTRAVENOUS
  Filled 2024-03-30: qty 1

## 2024-03-30 MED ORDER — IOHEXOL 300 MG/ML  SOLN
100.0000 mL | Freq: Once | INTRAMUSCULAR | Status: AC | PRN
  Administered 2024-03-30: 100 mL via INTRAVENOUS

## 2024-03-30 MED ORDER — ONDANSETRON 4 MG PO TBDP: 4.0000 mg | ORAL_TABLET | Freq: Three times a day (TID) | ORAL | 0 refills | Status: DC | PRN

## 2024-03-30 MED ORDER — ONDANSETRON HCL 4 MG/2ML IJ SOLN
4.0000 mg | Freq: Once | INTRAMUSCULAR | Status: AC
  Administered 2024-03-30: 4 mg via INTRAVENOUS
  Filled 2024-03-30: qty 2

## 2024-03-30 MED ORDER — ACETAMINOPHEN 10 MG/ML IV SOLN
1000.0000 mg | Freq: Four times a day (QID) | INTRAVENOUS | Status: DC
  Administered 2024-03-30: 1000 mg via INTRAVENOUS
  Filled 2024-03-30 (×2): qty 100

## 2024-03-30 MED ORDER — LIDOCAINE 5 % EX PTCH
1.0000 | MEDICATED_PATCH | CUTANEOUS | Status: DC
  Administered 2024-03-30: 1 via TRANSDERMAL
  Filled 2024-03-30: qty 1

## 2024-03-30 NOTE — ED Notes (Signed)
 Pt ambulated to bed side commode with x1 assist. Gate shaky. Pt returned to bed without incident.

## 2024-03-30 NOTE — Discharge Instructions (Signed)
 1) it is unclear to me the cause of your symptoms at this time however the chronicity and the workup today is reassuring.  This does not mean that nothing is wrong but rather you are safe to continue the workup as an outpatient.  Stick to a bland diet  2) call your cardiology team today.  Discussed your feelings on the use of amiodarone  and see if there are any other options. 3) rest and hydrate Return with any acutely worsening symptoms -- RETURN PRECAUTIONS & AFTERCARE: (ENGLISH) RETURN PRECAUTIONS: Return immediately to the emergency department or see/call your doctor if you feel worse, weak or have changes in speech or vision, are short of breath, have fever, vomiting, pain, bleeding or dark stool, trouble urinating or any new issues. Return here or see/call your doctor if not improving as expected for your suspected condition. FOLLOW-UP CARE: Call your doctor and/or any doctors we referred you to for more advice and to make an appointment. Do this today, tomorrow or after the weekend. Some doctors only take PPO insurance so if you have HMO insurance you may want to contact your HMO or your regular doctor for referral to a specialist within your plan. Either way tell the doctor's office that it was a referral from the emergency department so you get the soonest possible appointment.  YOUR TEST RESULTS: Take result reports of any blood or urine tests, imaging tests and EKG's to your doctor and any referral doctor. Have any abnormal tests repeated. Your doctor or a referral doctor can let you know when this should be done. Also make sure your doctor contacts this hospital to get any test results that are not currently available such as cultures or special tests for infection and final imaging reports, which are often not available at the time you leave the ER but which may list additional important findings that are not documented on the preliminary report. BLOOD PRESSURE: If your blood pressure was  greater than 120/80 have your blood pressure rechecked within 1 to 2 weeks. MEDICATION SIDE EFFECTS: Do not drive, walk, bike, take the bus, etc. if you have received or are being prescribed any sedating medications such as those for pain or anxiety or certain antihistamines like Benadryl . If you have been give one of these here get a taxi home or have a friend drive you home. Ask your pharmacist to counsel you on potential side effects of any new medication

## 2024-03-30 NOTE — ED Triage Notes (Addendum)
 Pt via [facility] from home. Pt c/o multiple complaints for the past couple months, states it started after she was started on amio. Reports its been getting worse the past couple of days. Pt c/o neck pain, headache, NV, and low grade fever. Also has a cough. Pt is A&Ox4 and NAD   EMS report last VS 140/90 BP, 99.8 oral, 130 CBG, 70 HR  EMS also gave 4mg  of Zofran  and 1g of Tylenol 

## 2024-03-30 NOTE — ED Provider Notes (Signed)
 [facility] Provider Note    Event Date/Time   First MD Initiated Contact with Patient 03/30/24 0830     (approximate)   History   Emesis   HPI  TAYVA EASTERDAY is a 73 y.o. female  with medical history significant of hypertension, hyperlipidemia, GERD, atrial fibrillation, SVT, anemia, OSA, seizures, subdural hematoma status post craniotomy, anxiety, depression, asthma, esophageal stricture, IBS, celiac disease, DVT presenting with 3 months of feeling unwell with nausea and vomiting and general abdominal pain along with chest pain.  Patient presents with daughter who helps contribute to the history.  Ever since patient was hospitalized and placed on amiodarone patient has felt unwell.  She she saw her primary care physician and a new blood pressure medication was started yesterday which she took and subsequently developed nausea and vomiting.  Patient has a long history of right-sided neck pain from arthritis and this is unchanged.  Reports continued headaches and possibly a low-grade fever.      Physical Exam   Triage Vital Signs: ED Triage Vitals [03/30/24 0755]  Encounter Vitals Group     BP (!) 140/53     Girls Systolic BP Percentile      Girls Diastolic BP Percentile      Boys Systolic BP Percentile      Boys Diastolic BP Percentile      Pulse Rate 85     Resp 18     Temp 98.5 F (36.9 C)     Temp Source Oral     SpO2 97 %     Weight 138 lb (62.6 kg)     Height 5' 1 (1.549 m)     Head Circumference      Peak Flow      Pain Score      Pain Loc      Pain Education      Exclude from Growth Chart     Most recent vital signs: Vitals:   03/30/24 1030 03/30/24 1215  BP: (!) 169/63 (!) 160/59  Pulse: 73 64  Resp: 18   Temp:    SpO2: 96% 100%    Nursing Triage Note reviewed. Vital signs reviewed and patients oxygen saturation is normoxic  General: Patient is well nourished, well developed, awake and alert, resting comfortably in no  acute distress Head: Normocephalic and atraumatic Eyes: Normal inspection, extraocular muscles intact, no conjunctival pallor Ear, nose, throat: Normal external exam Neck: Normal range of motion Respiratory: Patient is in no respiratory distress, lungs CTAB Cardiovascular: Patient is not tachycardic, RRR without murmur appreciated GI: Abd Soft, mild tenderness to palpation generally with no guarding or rebound  Back: Normal inspection of the back with good strength and range of motion throughout all ext Extremities: pulses intact with good cap refills, no LE pitting edema or calf tenderness Neuro: The patient is alert and oriented to person, place, and time, appropriately conversive, with 5/5 bilat UE/LE strength, no gross motor or sensory defects noted. Coordination appears to be adequate. Skin: Warm, dry, and intact Psych: normal mood and affect, no SI or HI  ED Results / Procedures / Treatments   Labs (all labs ordered are listed, but only abnormal results are displayed) Labs Reviewed  COMPREHENSIVE METABOLIC PANEL WITH GFR - Abnormal; Notable for the following components:      Result Value   Glucose, Bld 112 (*)    All other components within normal limits  URINALYSIS, ROUTINE W REFLEX MICROSCOPIC - Abnormal; Notable for  the following components:   Color, Urine YELLOW (*)    APPearance HAZY (*)    Specific Gravity, Urine 1.040 (*)    Protein, ur 30 (*)    Bacteria, UA RARE (*)    All other components within normal limits  T4, FREE - Abnormal; Notable for the following components:   Free T4 1.80 (*)    All other components within normal limits  RESP PANEL BY RT-PCR (RSV, FLU A&B, COVID)  RVPGX2  LIPASE, BLOOD  CBC  PROCALCITONIN  TSH  TROPONIN T, HIGH SENSITIVITY  TROPONIN T, HIGH SENSITIVITY     EKG EKG and rhythm strip are interpreted by myself:   EKG: [Normal sinus rhythm] at heart rate of 84, normal QRS duration, QTc 477, nonspecific ST segments and T waves no  ectopy EKG not consistent with Acute STEMI Rhythm strip: NSR in lead II   RADIOLOGY CT head: No intracranial hemorrhage on my independent review interpretation radiologist agrees CT chest abdomen pelvis with IV contrast: No acute abnormality on my independent review interpretation radiologist agrees and incidental findings were printed out and handed to patient's daughter and we reviewed them    PROCEDURES:  Critical Care performed: No  Procedures   MEDICATIONS ORDERED IN ED: Medications  acetaminophen  (OFIRMEV ) IV 1,000 mg (0 mg Intravenous Stopped 03/30/24 1207)  lidocaine  (LIDODERM ) 5 % 1 patch (1 patch Transdermal Patch Applied 03/30/24 1207)  ondansetron  (ZOFRAN ) injection 4 mg (4 mg Intravenous Given 03/30/24 0911)  morphine  (PF) 4 MG/ML injection 4 mg (4 mg Intravenous Given 03/30/24 0922)  iohexol  (OMNIPAQUE ) 300 MG/ML solution 100 mL (100 mLs Intravenous Contrast Given 03/30/24 1010)     IMPRESSION / MDM / ASSESSMENT AND PLAN / ED COURSE                                Differential diagnosis includes, but is not limited to, medication reaction, gastritis, gastroenteritis, atypical ACS, worsening intracranial hemorrhage, UTI, thyroid  abnormality URI   ED course: Patient presents with multiple symptoms and is hard to put together a unifying picture.  I am reassured that her abdomen demonstrates no evidence of peritonitis and her EKG demonstrates no acute ischemia.  Patient had 2 troponins both which were not elevated.  Urinalysis was not consistent with UTI.  Her COVID flu swabs were negative.  She had no leukocytosis or acute anemia no elevation of her liver function tests or creatinine.  Her procalcitonin was not elevated and her TSH was not elevated.  CT chest abdomen pelvis with IV contrast demonstrated no acute abnormality CT head demonstrated no acute abnormalities.  Patient was able to take p.o.  I will send a very short course of Zofran  to the patient's pharmacy of  choice but patient will follow-up with cardiologist.  All questions answered and patient and daughter voiced understanding and requested discharge   Clinical Course as of 03/30/24 1702  Thu Mar 30, 2024  0835 WBC: 7.2 No leukocytosis [HD]  1007 T4,Free(Direct)(!): 1.80 Slightly more elevated but TSH is okay [HD]  1051 CT Head Wo Contrast No acute abnormality [HD]  1051 Resp panel by RT-PCR (RSV, Flu A&B, Covid) Anterior Nasal Swab Negative [HD]  1051 Procalcitonin: <0.10 Not elevated [HD]  1119 Urinalysis, Routine w reflex microscopic -Urine, Clean Catch(!) Not consistent with UTI [HD]  1139 Patient resting comfortably did not awake [HD]  1140 Troponin T High Sensitivity: <15 [HD]    Clinical  Course User Index [HD] Nicholaus Rolland BRAVO, MD   At time of discharge there is no evidence of acute life, limb, vision, or fertility threat. Patient has stable vital signs, pain is well controlled, patient is ambulatory and p.o. tolerant.  Discharge instructions were completed using the EPIC system. I would refer you to those at this time. All warnings prescriptions follow-up etc. were discussed in detail with the patient. Patient indicates understanding and is agreeable with this plan. All questions answered.  Patient is made aware that they may return to the emergency department for any worsening or new condition or for any other emergency.  -- Risk: 5 This patient has a high risk of morbidity due to further diagnostic testing or treatment. Rationale: This patient's evaluation and management involve a high risk of morbidity due to the potential severity of presenting symptoms, need for diagnostic testing, and/or initiation of treatment that may require close monitoring. The differential includes conditions with potential for significant deterioration or requiring escalation of care. Treatment decisions in the ED, including medication administration, procedural interventions, or disposition  planning, reflect this level of risk. COPA: 5 The patient has the following acute or chronic illness/injury that poses a possible threat to life or bodily function: [X] : The patient has a potentially serious acute condition or an acute exacerbation of a chronic illness requiring urgent evaluation and management in the Emergency Department. The clinical presentation necessitates immediate consideration of life-threatening or function-threatening diagnoses, even if they are ultimately ruled out.   FINAL CLINICAL IMPRESSION(S) / ED DIAGNOSES   Final diagnoses:  Chest pain, unspecified type  Abdominal pain, unspecified abdominal location  Nausea     Rx / DC Orders   ED Discharge Orders          Ordered    lidocaine  (LIDODERM ) 5 %  Every 24 hours        03/30/24 1204    ondansetron  (ZOFRAN -ODT) 4 MG disintegrating tablet  Every 8 hours PRN        03/30/24 1204             Note:  This document was prepared using Dragon voice recognition software and may include unintentional dictation errors.   Nicholaus Rolland BRAVO, MD 03/30/24 [phone]

## 2024-03-30 NOTE — ED Notes (Signed)
Pt given water and crackers for po challenge

## 2024-04-04 NOTE — Progress Notes (Signed)
 Established Patient Visit   Chief Complaint: Chief Complaint  Patient presents with   Hypertension   Atrial Fibrillation    Pt recently stopped amiodarone    Date of Service: 04/04/2024 Date of Birth: 1950-12-02 PCP: Cleotilde Oneil Novel, MD  History of Present Illness: [name] is a 73 y.o.female patient who returns for   1.  Paroxysmal atrial fibrillation  2.  Essential hypertension  3.  Hyperlipidemia  4.  Raynaud's phenomena 5.  Traumatic subdural hematoma status postcraniotomy 06/21/2023  The patient was referred by her primary care provider for evaluation of chest pain and shortness of breath. She has a history of hyperlipidemia and hypertension. For about 1 month, the patient reported experienced substernal chest discomfort with radiation to her subscapular region while doing activities such as making the bed, and exertional dyspnea. She also reported chest pressure when carrying groceries up stairs. She reports a 1 month history of intermittent pedal edema. Her family history is notable for both of her brothers having CABG and stents, her mother having CHF, and her sister having CABG. The patient had a heart catheterization in the 1980s. The patient underwent a coronary CTA 03/08/2020, ordered by [facility], which revealed a calcium  score of 2.37, placing the patient in the 47th percentile. There was minimal mixed (1-24%) non-obstructive coronary artery disease of the mid LAD. Coronary CT in 11/2017 revealed a calcium  score of 0.   2D echocardiogram on 10/09/2021 revealed normal left ventricular function with LVEF greater than 55% with mild to moderate mitral regurgitation, mild tricuspid and aortic regurgitation, and mild septal hypertrophy.  The patient underwent Lexiscan  Myoview  on 10/09/2021.  Gated scintigraphy revealed LVEF 76%.  SPECT imaging revealed no evidence for scar or ischemia.   The patient was seen at [facility] ED 11/28/2022 for near syncopal episode.  Patient was initially noted to  be in atrial fibrillation with rapid ventricular rate, spontaneously converted to sinus rhythm.  She was discharged home on Eliquis  5 mg twice daily.  And again admitted 02/06/2024 with atrial fibrillation with rapid ventricular rate felt to be secondary to dehydration.  She has been seen in follow-up by Dr. Ezzard 02/15/2024 who is considering catheter ablation.  The patient reports she has seen Dr. [name] during the interim who feels that his subdural hematoma is stable.  2D echocardiogram 02/06/2024 revealed LVEF 65-70% with moderate mitral gravitation.  Lexiscan  Myoview  03/01/2024 revealed LVEF 67% with mild anterior ischemia.  Patient seen at [facility] ED 02/28/2024 with chief complaint of nausea, vomiting abdominal pain felt to be due to amiodarone , which was discontinued with resolution of symptoms.  Coronary CT angiography was scheduled for the same day which was postponed.  She returns today for follow-up, reports doing okay.  She denies exertional chest pain or shortness of breath.  She denies palpitations or heart racing.  She has chronic peripheral edema.  The patient is active, but does not exercise regularly.   ECG reveals normal sinus rhythm at 73 bpm.  2D echocardiogram 10/20/2022 per her PCP showed normal left ventricular function with LVEF greater than 55% with mild aortic and tricuspid regurgitation with focal area of septal wall hypertrophy at the LVOT creating increased LVOT velocities.    14-day Holter monitor 11/28/2022 - 12/16/2022 revealed predominant sinus rhythm with mean heart rate of 65 bpm, sinus heart rate range 46 to 108 bpm, occasional premature atrial contractions, rare atrial runs the longest lasting 6 beats, without sustained episodes of atrial fibrillation.  Cardiac MRI 12/16/2022 revealed LVEF 68%, without  gadolinium enhancement, without LVOT obstruction.    The patient has paroxysmal atrial fibrillation, CHA2DS2-VASc score of 3, on Eliquis  for stroke prevention.  The patient in  her bathroom, hit her head, developed subdural hematoma, underwent craniotomy 06/21/2023.  The patient hit her head on her daughter's car 08/11/2023.  CT scan revealed stable chronic bleed without new acute bleed and no further intervention was required.  The patient has essential hypertension with good blood pressure control today on metoprolol  succinate and spironolactone , which are tolerated well without apparent side effects.  The patient follows a low-sodium, no added salt diet.   The patient has hyperlipidemia, LDL 83 on 09/18/2022, currently on atorvastatin , which is tolerated well without apparent side effects.  The patient follows a low-cholesterol, low-fat diet.  Past Medical and Surgical History  Past Medical History Past Medical History:  Diagnosis Date   Anxiety    Arthritis    Asthma, unspecified asthma severity, unspecified whether complicated, unspecified whether persistent (HHS-HCC)    Depression    Diverticulitis    GERD (gastroesophageal reflux disease)    Hyperlipidemia    Hypertension    IBS (irritable bowel syndrome)    Sleep apnea    Thrombosis    Left Leg    Past Surgical History She has a past surgical history that includes Hysterectomy; Appendectomy; Cholecystectomy; Colon surgery; Nissen fundoplication; Colonoscopy (01/27/2021); egd (01/27/2021); and EGD @ [facility] (01/25/2023).   Medications and Allergies  Current Medications  Current Outpatient Medications  Medication Sig Dispense Refill   albuterol  MDI, PROVENTIL , VENTOLIN , PROAIR , HFA 90 mcg/actuation inhaler Inhale 2 inhalations into the lungs every 6 (six) hours as needed 1 each 11   atorvastatin  (LIPITOR) 20 MG tablet Take 1 tablet (20 mg total) by mouth once daily 90 tablet 3   citalopram  (CELEXA ) 20 MG tablet Take 1 tablet (20 mg total) by mouth once daily 90 tablet 3   doxazosin (CARDURA) 4 MG tablet Take 1 tablet (4 mg total) by mouth at bedtime 30 tablet 11   ELIQUIS  5 mg tablet TAKE  1 TABLET BY MOUTH EVERY 12 HOURS 180 tablet 0   LORazepam  (ATIVAN ) 0.5 MG tablet Take 1 tablet (0.5 mg total) by mouth once daily as needed 30 tablet 5   metoprolol  SUCCinate (TOPROL -XL) 25 MG XL tablet Take 25 mg by mouth 2 (two) times daily     pantoprazole  (PROTONIX ) 40 MG DR tablet Take 1 tablet (40 mg total) by mouth once daily 30 tablet 11   AMIOdarone  (PACERONE ) 200 MG tablet Take 200 mg by mouth 2 (two) times daily     fluticasone  propionate (FLOVENT  HFA) 110 mcg/actuation inhaler Inhale 2 inhalations into the lungs 2 (two) times daily (Patient not taking: Reported on 04/04/2024) 12 g 11   metoprolol  TARTrate (LOPRESSOR ) 25 MG tablet Take 1 dose (25 mg total) by mouth once along with your morning dose of beta-blocker on the day of your heart CT exam. (Patient not taking: Reported on 04/04/2024) 1 tablet 0   No current facility-administered medications for this visit.    Allergies: Influenza a (h1n1) vac 2009, Losartan , Amitriptyline, Ciprofloxacin , Doxycycline, Lexapro [escitalopram], Lisinopril, and Penicillins  Social and Family History  Social History  reports that she has never smoked. She has never used smokeless tobacco. She reports that she does not currently use alcohol. She reports that she does not use drugs.  Family History Family History  Problem Relation Name Age of Onset   Coronary Artery Disease (Blocked arteries around heart) Mother  Stroke Mother     High blood pressure (Hypertension) Mother     Heart failure Mother     Brain cancer Father     High blood pressure (Hypertension) Father     Diabetes type II Sister     Heart disease Sister     Heart disease Brother      Review of Systems   Review of Systems: The patient reports chest pain, with shortness of breath, without orthopnea, paroxysmal nocturnal dyspnea, with intermittent pedal edema, without palpitations, heart racing, presyncope, syncope. Review of 8 Systems is negative except as  described above.  Physical Examination   Vitals:BP (!) 140/70   Pulse 73   Ht 154.9 cm (5' 1)   Wt 61.2 kg (135 lb)   SpO2 98%   BMI 25.51 kg/m  Ht:154.9 cm (5' 1) Wt:61.2 kg (135 lb) ADJ:Anib surface area is 1.62 meters squared.  General: Alert and oriented. Well-appearing. No acute distress. HEENT: Pupils equally reactive to light and accomodation    Neck: no JVD Lungs: Normal effort of breathing; clear to auscultation bilaterally; no wheezes, rales, rhonchi Heart: Regular rate and rhythm. No murmur, rub, or gallop Abdomen: nondistended Extremities: no cyanosis, clubbing, or edema Peripheral Pulses: 2+ radial  Skin: Warm, dry, no diaphoresis   Assessment   73 y.o. female with  1. Paroxysmal atrial fibrillation (CMS/HHS-HCC)   2. Need for vaccination   3. Benign essential hypertension   4. Chest pain with high risk for cardiac etiology   5. Hyperlipidemia, mixed    73 year old female with a history of hypertension, hyperlipidemia, and strong family history of coronary artery disease, with a 1 month history of exertional chest pain and shortness of breath as well as mild pedal edema. The patient had a cardiac CT 02/2020, which revealed minimal mixed nonobstructive CAD of the mid LAD with a calcium  score of 2.37. She is currently on aspirin  and statin.  Lexiscan  Myoview  was negative for scar or ischemia. She was started on a trial of Imdur  with some improvement of symptoms.  2D echocardiogram 10/20/2022 revealed LVEF greater than 55% with focal area of septal wall hypertrophy at the LVOT creating increased LVOT velocities.  The patient is scheduled for cardiac MRI on 12/23/2022.  Patient recently seen at [facility] ED 11/30/2022 following near syncope, noted to be in atrial fibrillation with rapid ventricular rate, converted to sinus rhythm, on Eliquis  for stroke prevention.  T&A Holter monitor 12/02/2022 - 12/16/2022 revealed sinus rhythm without evidence for atrial fibrillation.  Cardiac MRI  12/16/2022 revealed normal left ventricular function, without evidence for hypertrophic obstructive cardiomyopathy.  Patient had traumatic subdural hematoma 06/21/2023 requiring craniotomy.  The patient has seen Dr. Ezzard 02/15/2024 considering catheter ablation pending Lexiscan  Myoview  results which was performed 03/01/2024 which revealed mild anterior ischemia in the absence of chest pain.    Plan   Continue current medications Counseled patient about low-sodium diet 3.   DASH diet printed instructions given to the patient Counseled patient about low-cholesterol diet Continue atorvastatin  for hyperlipidemia management 5.   Low-fat and cholesterol diet printed instructions given to the patient 6.   Continue Eliquis  for stroke prevention 7.   Reschedule coronary CT angiography 8.   Return to clinic after coronary CT angiography  Orders Placed This Encounter  Procedures   ECG 12-lead    Return in about 1 week (around 04/11/2024), or after coronary CTA.   MARSA DOOMS, MD PhD [facility]

## 2024-04-20 ENCOUNTER — Encounter (HOSPITAL_COMMUNITY): Payer: Self-pay

## 2024-04-24 ENCOUNTER — Ambulatory Visit
Admission: RE | Admit: 2024-04-24 | Discharge: 2024-04-24 | Disposition: A | Source: Ambulatory Visit | Attending: Cardiology | Admitting: Cardiology

## 2024-04-24 DIAGNOSIS — I2089 Other forms of angina pectoris: Secondary | ICD-10-CM | POA: Diagnosis present

## 2024-04-24 DIAGNOSIS — R079 Chest pain, unspecified: Secondary | ICD-10-CM | POA: Insufficient documentation

## 2024-04-24 MED ORDER — IOHEXOL 350 MG/ML SOLN
100.0000 mL | Freq: Once | INTRAVENOUS | Status: AC | PRN
  Administered 2024-04-24: 10:00:00 100 mL via INTRAVENOUS

## 2024-04-24 MED ORDER — METOPROLOL TARTRATE 5 MG/5ML IV SOLN: 10.0000 mg | Freq: Once | INTRAVENOUS | Status: DC | PRN

## 2024-04-24 MED ORDER — NITROGLYCERIN 0.4 MG SL SUBL
0.8000 mg | SUBLINGUAL_TABLET | Freq: Once | SUBLINGUAL | Status: AC
  Administered 2024-04-24: 10:00:00 0.8 mg via SUBLINGUAL
  Filled 2024-04-24: qty 25

## 2024-04-24 MED ORDER — DILTIAZEM HCL 25 MG/5ML IV SOLN: 10.0000 mg | INTRAVENOUS | Status: DC | PRN

## 2024-04-24 NOTE — Progress Notes (Signed)
 Patient tolerated procedure well. Ambulate w/o difficulty. Denies any lightheadedness or being dizzy. Pt denies any pain at this time. Sitting in chair. Pt is encouraged to drink additional water throughout the day and reason explained to patient. Patient verbalized understanding and all questions answered. ABC intact. No further needs at this time. Discharge from procedure area w/o issues.

## 2024-04-25 NOTE — Progress Notes (Signed)
 Referring Provider: Ezzard Lamar Kent, Md [address] [place],  [place] [address]  Primary Care Provider: Cleotilde Oneil Novel, MD [address] [facility] [address] [facility] [place] [address]     Patient Identification and History of Present Illness  02/17/2024: [name] is a 73 y.o. female with a history of HTN, Asthma, Subdural hematoma with surgical evacuation 06/2023 with mostly recovered neurologic function, who has experiencing Afib occasionally resulting in syncope with RVR.  She also noted on prior monitor several episodes of SVT the longest 6 beats likely Atach. She has a normal appearing echo from this admission. With moderate MR. While in the hospital for her most recent episode of Afib related syncope, which admittedly was likely exacerbated by viral illness, she was started on Amio.  She was started on 800 mg daily and has developed a slight hand tremor dose reduced to 200mg  today which will hopefully resolve the issue. Today she also notes that she has fairly consistent SOA with exertion and chest heaviness. Because of this I spoke with Dr. Ammon and we will get adenosine stress. While discussing options for treatment of Afib she states that she was told by her Neurosurgeon Dr. [name] not to have anesthesia because of the subdural history.  Today we discussed options for dealing with Afib considering that this results in syncope on occasion continuing Amio or considering another AAD, and PVI the risk and benefits of all discussed.     04/25/2024: Spoke with neurosurgery and they are ok proceeding with PVI given heparin  and anesthesia. Insignificant CAD on CTA, full report on non-cardiac structures not available. Today we again discussed the risk, benefits, and alternatives of 1. No further action, 2. AAD, and 3. PVI. Unfortunately her insurance is changing and [facility] is no longer an option for her to have procedures.    Problem List    Patient  Active Problem List  Diagnosis   GERD (gastroesophageal reflux disease)   IBS (irritable bowel syndrome)   Hyperlipidemia, mixed   Benign essential hypertension   Chest pain with high risk for cardiac etiology   Medicare annual wellness visit, initial   Lumbar disc disease   RAD (reactive airway disease), moderate persistent, uncomplicated (HHS-HCC)   Family history of coronary artery disease   History of 2019 novel coronavirus disease (COVID-19)   B12 deficiency   Iron deficiency anemia due to chronic blood loss   Paroxysmal atrial fibrillation (CMS/HHS-HCC)   Subdural hematoma (CMS-HCC)   Bradycardia   Atherosclerosis of aorta ()   Syncope   SVT (supraventricular tachycardia) (HHS-HCC)      Allergies    Allergies  Allergen Reactions   Influenza A (H1n1) Vac 2009 Anaphylaxis   Amitriptyline Hallucination   Ciprofloxacin  Unknown   Doxycycline Vomiting   Lexapro [Escitalopram] Unknown   Lisinopril Other (See Comments)    Choking.   Penicillins Rash     Medications    Current Outpatient Medications  Medication Sig Dispense Refill   albuterol  MDI, PROVENTIL , VENTOLIN , PROAIR , HFA 90 mcg/actuation inhaler Inhale 2 inhalations into the lungs every 6 (six) hours as needed 1 each 11   atorvastatin  (LIPITOR) 20 MG tablet Take 1 tablet (20 mg total) by mouth once daily 90 tablet 3   ELIQUIS  5 mg tablet TAKE 1 TABLET BY MOUTH EVERY 12 HOURS 180 tablet 0   lidocaine  (LIDODERM ) 5 % patch APPLY 1 PATCH ONTO THE SKIN DAILY FOR 10 DAYS. REMOVE AND DISCARD PATCH WITHIN 12 HOURS OR  AS DIRECTED BY MD.     LORazepam  (ATIVAN ) 0.5 MG tablet Take 1 tablet (0.5 mg total) by mouth once daily as needed (Patient taking differently: Take 0.5 mg by mouth once daily) 30 tablet 5   metoprolol  SUCCinate (TOPROL -XL) 25 MG XL tablet 1 tab a.m., 1/2 pill p.m.     ondansetron  (ZOFRAN -ODT) 4 MG disintegrating tablet Take 4 mg by mouth every 8 (eight) hours as needed      pantoprazole  (PROTONIX ) 40 MG DR tablet Take 1 tablet (40 mg total) by mouth once daily 30 tablet 11   telmisartan (MICARDIS) 40 MG tablet Take 1 tablet (40 mg total) by mouth once daily 30 tablet 11   citalopram  (CELEXA ) 20 MG tablet Take 1 tablet (20 mg total) by mouth once daily 90 tablet 3   No current facility-administered medications for this visit.     Social History    Social History   Socioeconomic History   Marital status: Divorced  Tobacco Use   Smoking status: Never   Smokeless tobacco: Never  Vaping Use   Vaping status: Never Used  Substance and Sexual Activity   Alcohol use: Not Currently    Comment: Very rare, less than 5 times per year   Drug use: No   Sexual activity: Not Currently    Birth control/protection: Surgical  Social History Narrative   Retired. Lives alone.   Social Drivers of Corporate Investment Banker Strain: Low Risk  (04/15/2024)   Overall Financial Resource Strain (CARDIA)    Difficulty of Paying Living Expenses: Not hard at all  Food Insecurity: No Food Insecurity (04/15/2024)   Hunger Vital Sign    Worried About Running Out of Food in the Last Year: Never true    Ran Out of Food in the Last Year: Never true  Transportation Needs: No Transportation Needs (04/15/2024)   PRAPARE - Administrator, Civil Service (Medical): No    Lack of Transportation (Non-Medical): No  Housing Stability: Low Risk  (04/15/2024)   Housing Stability Vital Sign    Unable to Pay for Housing in the Last Year: No    Number of Times Moved in the Last Year: 0    Homeless in the Last Year: No     Family History    Family History  Problem Relation Name Age of Onset   Coronary Artery Disease (Blocked arteries around heart) Mother     Stroke Mother     High blood pressure (Hypertension) Mother     Heart failure Mother     Brain cancer Father     High blood pressure (Hypertension) Father     Diabetes type II Sister      Heart disease Sister     Heart disease Brother       Review of Systems     GENERAL HEALTH/CONSTITUTIONAL []  Appetite change []  Fevers or chills []  Fatigue []  Unexpected weight gain/loss []  Reduced activity  EYES, EARS, NOSE, THROAT []  Vision changes (blurred, double vision) []  Hearing changes / loss []  Tinnitus / ringing in ears []  Dental problem (dentition / gums) [x]  Vertigo / dizziness []  Nose bleeds []  Voice changes []  Trouble swallowing  RESPIRATORY []  Cough []  Shortness of breath []  Sleep apnea []  Snoring []  Sputum []  Wheezing  CARDIOVASCULAR []  Chest pain, pressure, or tightness [x]  Palpitations/heart flutters []  Near passing out []  Passing out []  Leg / ankle swelling []  Waking up short of breath []  Sleeping on an incline/more than  one pillow []  Leg pain / cramps with walking  GASTROINTESTINAL []  Abdominal bloating/swelling []  Abdominal pain []  Blood on toilet tissue, in bowl, in stool []  Black, tarry stool []  Constipation []  Diarrhea []  Nausea []  Vomiting []  Heartburn symptoms URINARY []  Blood in urine []  Excessive urination []  Frequent urination []  Difficulty urinating []  Flank pain / painful urination []  Excess sweating / thirst []  Nocturia need to urinate more then 1 time at night []  Decreased amount of urine  MUSCULOSKELETAL [x]  Joint aches/pains []  Swollen joints []  Back pain []  Muscle aches / pains  SKIN []  Color change []  Rash []  Wound  NEUROLOGIC []  Memory Changes []  Tremors / problems with balance []  Weakness []  Tingling / paresthesias []  Numbness []  Headaches []  Speech difficulty  HEMATOLOGIC []  History of blood clots []  Bruises/bleeds easily  PSYCHIATRIC []  Anxiety / Stress []  Depression []  Sleep disturbance  ENDOCRINE []  Thyroid  problems []  Heat intolerance []  Cold Intolerance      Physical Exam   Vitals: BP (!) 152/76   Pulse 67   Ht 180.3 cm (5' 11)   Wt 58.5 kg (129 lb)   SpO2 98%   BMI  17.99 kg/m   General Appearance:  alert, cooperative, in NAD  HEENT: oropharynx clear, moist mucous membranes  Neurologic: motor and sensory grossly normal bilaterally   Psych: oriented to time, place and person, mood and affect are appropriate  Neck: supple, no significant adenopathy, no jugular venous distension, no bruits  Lungs:   clear to auscultation with good air movement, no rales bilaterally, unlabored breathing   Heart:  normal rate, regular rhythm, normal S1, S2, no audible murmurs or gallops  Abdomen:   soft, nontender, nondistended, normoactive bowel sounds   Extremities: no pedal edema noted, warm   Musculoskeletal: Moves all extremities. Ambulates without Difficulty   Skin: No rashes or ulcers   Cardiac Data   12 lead ECG:  I personally ordered, reviewed, and interpreted the ECG performed on 04/25/2024 showing: rhythm: normal sinus rhythm,  Results for orders placed or performed in visit on 04/25/24  ECG 12-lead   Collection Time: 04/25/24  1:56 PM  Result Value Ref Range   Vent Rate (bpm) 58    PR Interval (msec) 178    QRS Interval (msec) 80    QT Interval (msec) 432    QTc (msec) 424      Review and Summary of Prior Cardiac Studies: 01/2024 Echo OSH:  1. Left ventricular ejection fraction, by estimation, is 65 to 70%. The left ventricle has normal function. The left ventricle has no regional wall motion abnormalities. Left ventricular diastolic parameters are indeterminate.   2. Right ventricular systolic function is normal. The right ventricular size is normal.   3. Left atrial size was mildly dilated.   4. The mitral valve is normal in structure. Moderate mitral valve regurgitation.   5. Tricuspid valve regurgitation is mild to moderate.   6. The aortic valve is tricuspid. Aortic valve regurgitation is mild.   CT angio 04/2024 OSH:  1. Coronary calcium  score of 94. This was 64th percentile for age  and sex matched control.   2. Normal coronary origin with  right dominance.   3. Minimal proximal and mid LAD stenosis (<25%).   4. CAD-RADS 1. Minimal non-obstructive CAD (0-24%). Consider  non-atherosclerotic causes of chest pain. Consider preventive  therapy and risk factor modification.   5. See separate report from [facility] radiology for non-cardiac  findings. (Not available to see)  Assessment and Plan   02/17/2024: [name] is a 73 y.o. female with a history of HTN, Asthma, Subdural hematoma with surgical evacuation 06/2023 with mostly recovered neurologic function, who has experiencing Afib occasionally resulting in syncope with RVR.  She also noted on prior monitor several episodes of SVT the longest 6 beats likely Atach. She has a normal appearing echo from this admission. With moderate MR. While in the hospital for her most recent episode of Afib related syncope, which admittedly was likely exacerbated by viral illness, she was started on Amio.  She was started on 800 mg daily and has developed a slight hand tremor dose reduced to 200mg  today which will hopefully resolve the issue. Today she also notes that she has fairly consistent SOA with exertion and chest heaviness. Because of this I spoke with Dr. Ammon and we will get adenosine stress. While discussing options for treatment of Afib she states that she was told by her Neurosurgeon Dr. [name] not to have anesthesia because of the subdural history.  Today we discussed options for dealing with Afib considering that this results in syncope on occasion continuing Amio or considering another AAD, and PVI the risk and benefits of all discussed.     04/25/2024: Spoke with neurosurgery and they are ok proceeding with PVI given heparin  and anesthesia. Insignificant CAD on CTA, full report on non-cardiac structures not available. Today we again discussed the risk, benefits, and alternatives of 1. No further action, 2. AAD, and 3. PVI. Unfortunately her insurance is changing and [facility] is no longer an option for her to have procedures.    She would like to proceed with PVI, I contacted my colleague Dr. Donnice Primus at [facility] and he will arrange to perform her ablation.   Future Appointments     Date/Time Provider Department Center Visit Type   05/01/2024 11:30 AM Paraschos, Marsa, MD [facility] C FOLLOW UP   05/16/2024 10:15 AM Cleotilde Oneil Novel, MD [facility] MARYL BROCKS [facility] OFFICE VISIT   10/12/2024 9:00 AM [facility] WEST LAB [facility] C LAB   10/19/2024 10:00 AM Cleotilde Oneil Novel, MD [facility] KERNODLE C [facility] OFFICE VISIT       Orders Placed This Encounter  Procedures   ECG 12-lead     I spent a total of 40 minutes in both face-to-face and non-face-to-face activities, excluding procedures performed, for this visit on the date of this encounter.

## 2024-04-27 ENCOUNTER — Telehealth: Payer: Self-pay | Admitting: Student in an Organized Health Care Education/Training Program

## 2024-04-27 NOTE — Telephone Encounter (Signed)
 Spoke to Dr. Myer Kerns about patient on 04/25/24. Left a VM last night and tonight [phone]) and left call back number.   [name] is a 81F with AF/RVR with syncope, SDH s/p surgical evacuation (06/2023) with minimal residual deficits, HTN, and asthma who was referred to Dr. Kerns for evaluation and management of AF/RVR.  She also had SVT that was short duration and likely an atrial tachycardia.  She had moderate MR on recent TTE.  Recent AF/RVR with associated syncope was in the setting of a viral illness.  She was started on amiodarone  for new onset AF/RVR but developed a mild hand tremor at higher doses which resolved with decrease in amiodarone  dose. Dr. [name] (neurosurgery) spoke with Dr. Kerns and was okay with both general anesthesia and heparin .  Because of insurance constraints she was unable to have her procedure covered at [facility] and so she is referred for ablation at [facility].  Will continue to follow up and see if she would like to proceed with ablation.  Melissa DELENA Primus, MD [facility]  Cardiac Electrophysiology

## 2024-04-29 ENCOUNTER — Telehealth: Payer: Self-pay | Admitting: Student in an Organized Health Care Education/Training Program

## 2024-04-29 NOTE — Telephone Encounter (Signed)
 Spoke with her yesterday and today about different options for management of her atrial fibrillation and she would like to proceed with catheter ablation.  She understands the risk, benefits and alternatives of this.  Will have her come in on 05/12/24 (1300 tentatively but ok to overbook wherever easiest). She would like a later case if possible for 01/12. We should be able to accommodate as I fill earlier spots upcoming.   Discussed treatment options today for AF including antiarrhythmic drug therapy and ablation. Discussed risks, recovery and likelihood of success with each treatment strategy. Risk, benefits, and alternatives to EP study and ablation for afib were discussed. These risks include but are not limited to stroke, bleeding, vascular damage, tamponade, perforation, damage to the esophagus, lungs, phrenic nerve and other structures, worsening renal function, coronary vasospasm and death.  Discussed potential need for repeat ablation procedures and antiarrhythmic drugs after an initial ablation. The patient understands these risk and wishes to proceed.  We will therefore proceed with catheter ablation. Carto, ICE, anesthesia are requested for the procedure.   Pre procedure details: Procedure date: 05/22/24, third case if possible  Carto/ICE/GA-anesthesia, no CT scan pre procedure  Hold apixaban  and all medications the morning of the procedure Take apixaban  and all medications the night prior   Donnice DELENA Primus, MD [facility]  Cardiac Electrophysiology

## 2024-05-12 ENCOUNTER — Encounter: Payer: Self-pay | Admitting: Student in an Organized Health Care Education/Training Program

## 2024-05-12 ENCOUNTER — Ambulatory Visit
Attending: Student in an Organized Health Care Education/Training Program | Admitting: Student in an Organized Health Care Education/Training Program

## 2024-05-12 VITALS — BP 155/53 | HR 56 | Ht 61.0 in | Wt 136.0 lb

## 2024-05-12 DIAGNOSIS — I480 Paroxysmal atrial fibrillation: Secondary | ICD-10-CM

## 2024-05-12 DIAGNOSIS — I471 Supraventricular tachycardia, unspecified: Secondary | ICD-10-CM | POA: Diagnosis not present

## 2024-05-12 NOTE — Progress Notes (Signed)
 " Cardiology Office Note   Date: 05/12/24 ID:  SHARMAIN LASTRA, DOB June 14, 1950, MRN [id number] PCP: Cleotilde Oneil FALCON, MD  [facility] Providers Cardiologist:  Vinie JAYSON Maxcy, MD Electrophysiologist:  Donnice DELENA Primus, MD  (Primary Electrophysiologist: Myer Kerns, MD, [facility])   History of Present Illness [name] is a 74 y.o. female with symptomatic AF/RVR with associated syncope resulting in SDH requiring surgical evacuation (06/2023), nonsustained atrial tachycardia, HTN and asthma who presents for management of atrial arrhythmias.  Discussed the use of AI scribe software for clinical note transcription with the patient, who gave verbal consent to proceed. History of Present Illness [name] is a 74 year old female with atrial fibrillation who presents for consideration of an ablation procedure.  She has a history of atrial fibrillation and is currently on Eliquis  (Apixaban ) for stroke prevention.   She was seen by Dr. Myer Kerns on 04/25/2024 after recent admission for AF/RVR and associated syncope.  She had been started on amiodarone  and during the loading dose had a mild hand tremor for which the amiodarone  was reduced.  Dr. Kerns and his team spoke with neurosurgery and they were okay with general anesthesia and heparin .  She had insignificant CAD on CTA.  Dr. Kerns had discussed different options for management including continuation of antiarrhythmic indications, rate control or ablation.  She was interested in ablation but her insurance had unfortunately change did not cover ablation at [facility] so she was referred to me for this.  I had a long discussion with her over the phone prior to our clinic visit regarding the risk, benefits and alternatives of ablation.  She presents to clinic today by herself to discuss these more in depth.  She experienced a significant fall in the past, which led to brain surgery. During this incident, she was on blood thinners, and she is  concerned about the risk of future falls and the potential for bleeding complications. She describes the fall as hitting the wall hard and 'saw stars'.  ROS: palpitations, syncope   Studies Reviewed  ECG review 05/12/24: SB 56, PR 170, QRS 90, QT/c 440/424 03/30/24: NSR 84, PR 162, QRS 84, QT/c 404/477 02/06/24: (10:29:30) AF/RVR 140, QRS 91, QT/c 318/486 02/06/24: (09:06:21) AF/RVR 142, QRS 92, QT/c 309/475 06/25/23: SB 52, PR 156, QRS 84, QT/c 454/422 11/30/22: (14:43:40) NSR 62, PR 158, QRS 93, QT/c 406/413 11/30/22: (12:43:44) AF/RVR 133, QRS 80, QT/c 302/449 03/01/22: NSR 82, PR 157, QRS 92, QT/c 367/429  02/28/22: (20:51:04) AF/VR 78, QRS 96, QT/c 411/469  cCT Result date: 04/24/24 1. Coronary calcium  score of 94. This was 64th percentile for age and sex matched control. 2. Normal coronary origin with right dominance. 3. Minimal proximal and mid LAD stenosis (<25%). 4. CAD-RADS 1. Minimal non-obstructive CAD (0-24%). Consider non-atherosclerotic causes of chest pain. Consider preventive therapy and risk factor modification.   5. See separate report from [facility] radiology for non-cardiac findings.  TTE Result date: 02/06/24  1. Left ventricular ejection fraction, by estimation, is 65 to 70%. The  left ventricle has normal function. The left ventricle has no regional  wall motion abnormalities. Left ventricular diastolic parameters are  indeterminate.   2. Right ventricular systolic function is normal. The right ventricular  size is normal.   3. Left atrial size was mildly dilated.   4. The mitral valve is normal in structure. Moderate mitral valve  regurgitation.   5. Tricuspid valve regurgitation is mild to moderate.   6.  The aortic valve is tricuspid. Aortic valve regurgitation is mild.   Risk Assessment/Calculations  CHA2DS2-VASc Score = 3  This indicates a 3.2% annual risk of stroke. The patient's score is based upon: CHF History: 0 HTN History:  1 Diabetes History: 0 Stroke History: 0 Vascular Disease History: 0 Age Score: 1 Gender Score: 1  Physical Exam VS:  BP (!) 155/53 (BP Location: Left Arm, Patient Position: Sitting, Cuff Size: Normal)   Pulse (!) 56   Ht 5' 1 (1.549 m)   Wt 136 lb (61.7 kg)   SpO2 98%   BMI 25.70 kg/m   Wt Readings from Last 3 Encounters:  05/12/24 136 lb (61.7 kg)  03/30/24 138 lb (62.6 kg)  02/06/24 144 lb 4.8 oz (65.5 kg)    GEN: Well nourished, well developed in no acute distress CARDIAC: RRR, no murmurs, rubs, gallops RESPIRATORY:  Clear to auscultation without rales, wheezing or rhonchi  EXTREMITIES:  No edema; No deformity   ASSESSMENT AND PLAN [name] is a 74 y.o. female with symptomatic AF/RVR with associated syncope resulting in SDH requiring surgical evacuation (06/2023), nonsustained atrial tachycardia, HTN and asthma who presents for management of atrial arrhythmias. Assessment & Plan Paroxysmal AF/RVR Managed with anticoagulation. Ablation considered to reduce AFib episodes. Discussed Watchman procedure for potential anticoagulation discontinuation. Shared decision-making emphasized. Consulted neurosurgeon, low intracranial bleeding risk during procedure.  We discussed different management strategies for her AF and that ablation would dramatically reduce her AF burden by probably 80% although it may not eliminate her AF entirely.  This likely will not get her off of OAC long-term although she has tolerated Eliquis  other than the fall and subsequent SDH.  Since then she has done okay on this.  She did express that if possible she would like to come off of [facility] but her daughter had expressed some reluctance about her going through any procedures at all.  I explained that while Watchman would allow for her to potentially discontinue OAC after 6 months it is likely not the same protection as OAC for ischemic stroke.  Considering this however it is beneficial to reduce future hemorrhagic  events if she were to have another fall although I think we are trading some risk for hemorrhagic stroke with slightly higher risk for ischemic stroke.  For now she would like to proceed with ablation alone which I think is very reasonable.  I did provide her my contact information because she was wanting to talk with her daughter further about the option of a concomitant procedure with Watchman acknowledging the limitations above. - Proceed with AFib ablation. - Consider Watchman procedure if she desires to discontinue anticoagulation. - Obtain CT scan prior to Watchman procedure if chosen.  History of intracranial hemorrhage Intracranial hemorrhage linked to anticoagulation. Discussed bleeding risks with anticoagulation and benefits of Watchman procedure. Consulted neurosurgeon, low intracranial bleeding risk during procedure. - Continue anticoagulation therapy unless Watchman procedure is performed.  Discussed treatment options today for AF including antiarrhythmic drug therapy and ablation. Discussed risks, recovery and likelihood of success with each treatment strategy. Risk, benefits, and alternatives to EP study and ablation for afib were discussed. These risks include but are not limited to stroke, bleeding, vascular damage, tamponade, perforation, damage to the esophagus, lungs, phrenic nerve and other structures, worsening renal function, coronary vasospasm and death.  Discussed potential need for repeat ablation procedures and antiarrhythmic drugs after an initial ablation. The patient understands these risk and wishes to proceed.  We will  therefore proceed with catheter ablation. Carto, ICE, anesthesia are requested for the procedure.   Pre-procedure details: Procedure date: 05/22/24 Carto/ICE/GA-anesthesia, no CT scan pre procedure  Hold apixaban  and all medications the morning of the procedure Take apixaban  and all medications the night prior   Dispo: RTC post ablation and then  subsequent f/u with [facility]er [facility]erns at [facility]   A total of 50 minutes was spent preparing for the patient, reviewing history, performing exam, document encounter, coordinating care and counseling the patient. 17 minutes was spent with direct patient care.   Signed, Donnice DELENA Primus, MD  "

## 2024-05-12 NOTE — Patient Instructions (Signed)
 Medication Instructions:  Your physician recommends that you continue on your current medications as directed. Please refer to the Current Medication list given to you today.  *If you need a refill on your cardiac medications before your next appointment, please call your pharmacy*  Lab Work: None ordered.  If you have labs (blood work) drawn today and your tests are completely normal, you will receive your results only by: MyChart Message (if you have MyChart) OR A paper copy in the mail If you have any lab test that is abnormal or we need to change your treatment, we will call you to review the results.  Testing/Procedures: 05/22/2024 as scheduled Your physician has recommended that you have an ablation. Catheter ablation is a medical procedure used to treat some cardiac arrhythmias (irregular heartbeats). During catheter ablation, a long, thin, flexible tube is put into a blood vessel in your groin (upper thigh), or neck. This tube is called an ablation catheter. It is then guided to your heart through the blood vessel. Radio frequency waves destroy small areas of heart tissue where abnormal heartbeats may cause an arrhythmia to start. Please see the instruction sheet given to you today.;  Follow-Up: At [facility], you and your health needs are our priority.  As part of our continuing mission to provide you with exceptional heart care, our providers are all part of one team.  This team includes your primary Cardiologist (physician) and Advanced Practice Providers or APPs (Physician Assistants and Nurse Practitioners) who all work together to provide you with the care you need, when you need it.  Your next appointment:   As scheduled

## 2024-05-22 DIAGNOSIS — I471 Supraventricular tachycardia, unspecified: Secondary | ICD-10-CM

## 2024-05-22 DIAGNOSIS — I480 Paroxysmal atrial fibrillation: Secondary | ICD-10-CM

## 2024-06-19 ENCOUNTER — Ambulatory Visit (HOSPITAL_COMMUNITY): Admitting: Physician Assistant

## 2024-07-26 ENCOUNTER — Ambulatory Visit (HOSPITAL_COMMUNITY)
Admission: RE | Admit: 2024-07-26 | Source: Home / Self Care | Admitting: Student in an Organized Health Care Education/Training Program

## 2024-07-26 ENCOUNTER — Encounter (HOSPITAL_COMMUNITY): Admission: RE | Payer: Self-pay | Source: Home / Self Care

## 2024-07-26 DIAGNOSIS — I480 Paroxysmal atrial fibrillation: Secondary | ICD-10-CM

## 2024-07-26 DIAGNOSIS — I471 Supraventricular tachycardia, unspecified: Secondary | ICD-10-CM

## 2024-07-26 SURGERY — ATRIAL FIBRILLATION ABLATION
Anesthesia: General

## 2024-08-21 ENCOUNTER — Ambulatory Visit: Admitting: Student
